# Patient Record
Sex: Female | Born: 1975 | Race: White | Hispanic: No | Smoking: Current every day smoker
Health system: Southern US, Community
[De-identification: ages and names within clinical notes are randomized; demographics above are authoritative.]

## PROBLEM LIST (undated history)

## (undated) DIAGNOSIS — B1920 Unspecified viral hepatitis C without hepatic coma: Secondary | ICD-10-CM

## (undated) DIAGNOSIS — Z87442 Personal history of urinary calculi: Secondary | ICD-10-CM

## (undated) DIAGNOSIS — F1910 Other psychoactive substance abuse, uncomplicated: Secondary | ICD-10-CM

## (undated) DIAGNOSIS — S92909K Unspecified fracture of unspecified foot, subsequent encounter for fracture with nonunion: Secondary | ICD-10-CM

## (undated) DIAGNOSIS — Z9889 Other specified postprocedural states: Secondary | ICD-10-CM

## (undated) DIAGNOSIS — F410 Panic disorder [episodic paroxysmal anxiety] without agoraphobia: Secondary | ICD-10-CM

## (undated) DIAGNOSIS — R569 Unspecified convulsions: Secondary | ICD-10-CM

## (undated) DIAGNOSIS — K859 Acute pancreatitis without necrosis or infection, unspecified: Secondary | ICD-10-CM

## (undated) DIAGNOSIS — F1010 Alcohol abuse, uncomplicated: Secondary | ICD-10-CM

## (undated) DIAGNOSIS — K259 Gastric ulcer, unspecified as acute or chronic, without hemorrhage or perforation: Secondary | ICD-10-CM

## (undated) DIAGNOSIS — J189 Pneumonia, unspecified organism: Secondary | ICD-10-CM

## (undated) DIAGNOSIS — R112 Nausea with vomiting, unspecified: Secondary | ICD-10-CM

## (undated) DIAGNOSIS — S3992XA Unspecified injury of lower back, initial encounter: Secondary | ICD-10-CM

## (undated) HISTORY — PX: BREAST SURGERY: SHX581

## (undated) HISTORY — PX: BACK SURGERY: SHX140

## (undated) HISTORY — PX: TONSILLECTOMY: SUR1361

---

## 2002-01-03 ENCOUNTER — Encounter: Payer: Self-pay | Admitting: Internal Medicine

## 2002-01-03 ENCOUNTER — Ambulatory Visit (HOSPITAL_COMMUNITY): Admission: RE | Admit: 2002-01-03 | Discharge: 2002-01-03 | Payer: Self-pay | Admitting: Internal Medicine

## 2004-08-28 ENCOUNTER — Emergency Department (HOSPITAL_COMMUNITY): Admission: EM | Admit: 2004-08-28 | Discharge: 2004-08-28 | Payer: Self-pay | Admitting: Emergency Medicine

## 2005-12-31 ENCOUNTER — Emergency Department (HOSPITAL_COMMUNITY): Admission: EM | Admit: 2005-12-31 | Discharge: 2005-12-31 | Payer: Self-pay | Admitting: Emergency Medicine

## 2007-12-30 ENCOUNTER — Emergency Department (HOSPITAL_COMMUNITY): Admission: EM | Admit: 2007-12-30 | Discharge: 2007-12-30 | Payer: Self-pay | Admitting: Emergency Medicine

## 2007-12-30 ENCOUNTER — Encounter: Payer: Self-pay | Admitting: Orthopedic Surgery

## 2008-01-01 ENCOUNTER — Ambulatory Visit: Payer: Self-pay | Admitting: Orthopedic Surgery

## 2008-01-01 DIAGNOSIS — M25579 Pain in unspecified ankle and joints of unspecified foot: Secondary | ICD-10-CM

## 2008-01-16 ENCOUNTER — Ambulatory Visit: Payer: Self-pay | Admitting: Orthopedic Surgery

## 2008-01-24 ENCOUNTER — Encounter: Payer: Self-pay | Admitting: Orthopedic Surgery

## 2008-01-31 ENCOUNTER — Encounter: Payer: Self-pay | Admitting: Orthopedic Surgery

## 2008-02-18 ENCOUNTER — Ambulatory Visit: Payer: Self-pay | Admitting: Orthopedic Surgery

## 2008-04-09 ENCOUNTER — Emergency Department (HOSPITAL_COMMUNITY): Admission: EM | Admit: 2008-04-09 | Discharge: 2008-04-10 | Payer: Self-pay | Admitting: Emergency Medicine

## 2008-04-09 ENCOUNTER — Ambulatory Visit: Payer: Self-pay | Admitting: Psychiatry

## 2008-04-10 ENCOUNTER — Inpatient Hospital Stay (HOSPITAL_COMMUNITY): Admission: RE | Admit: 2008-04-10 | Discharge: 2008-04-11 | Payer: Self-pay | Admitting: Psychiatry

## 2008-06-15 ENCOUNTER — Emergency Department (HOSPITAL_COMMUNITY): Admission: EM | Admit: 2008-06-15 | Discharge: 2008-06-15 | Payer: Self-pay | Admitting: Emergency Medicine

## 2008-07-31 ENCOUNTER — Emergency Department (HOSPITAL_COMMUNITY): Admission: EM | Admit: 2008-07-31 | Discharge: 2008-07-31 | Payer: Self-pay | Admitting: Emergency Medicine

## 2008-09-03 ENCOUNTER — Emergency Department (HOSPITAL_COMMUNITY): Admission: EM | Admit: 2008-09-03 | Discharge: 2008-09-04 | Payer: Self-pay | Admitting: Emergency Medicine

## 2008-09-04 ENCOUNTER — Ambulatory Visit: Payer: Self-pay | Admitting: Psychiatry

## 2008-09-04 ENCOUNTER — Inpatient Hospital Stay (HOSPITAL_COMMUNITY): Admission: EM | Admit: 2008-09-04 | Discharge: 2008-09-07 | Payer: Self-pay | Admitting: Psychiatry

## 2008-10-29 ENCOUNTER — Emergency Department (HOSPITAL_COMMUNITY): Admission: EM | Admit: 2008-10-29 | Discharge: 2008-10-30 | Payer: Self-pay | Admitting: Emergency Medicine

## 2009-01-21 ENCOUNTER — Emergency Department (HOSPITAL_COMMUNITY): Admission: EM | Admit: 2009-01-21 | Discharge: 2009-01-21 | Payer: Self-pay | Admitting: Emergency Medicine

## 2009-05-03 ENCOUNTER — Inpatient Hospital Stay (HOSPITAL_COMMUNITY): Admission: RE | Admit: 2009-05-03 | Discharge: 2009-05-06 | Payer: Self-pay | Admitting: Psychiatry

## 2009-05-03 ENCOUNTER — Ambulatory Visit: Payer: Self-pay | Admitting: Psychiatry

## 2009-05-03 ENCOUNTER — Emergency Department (HOSPITAL_COMMUNITY): Admission: EM | Admit: 2009-05-03 | Discharge: 2009-05-03 | Payer: Self-pay | Admitting: Emergency Medicine

## 2009-05-11 ENCOUNTER — Emergency Department (HOSPITAL_COMMUNITY): Admission: EM | Admit: 2009-05-11 | Discharge: 2009-05-11 | Payer: Self-pay | Admitting: Emergency Medicine

## 2009-11-07 ENCOUNTER — Ambulatory Visit: Payer: Self-pay | Admitting: Psychiatry

## 2009-11-07 ENCOUNTER — Emergency Department (HOSPITAL_COMMUNITY): Admission: EM | Admit: 2009-11-07 | Discharge: 2009-11-07 | Payer: Self-pay | Admitting: Emergency Medicine

## 2009-11-07 ENCOUNTER — Inpatient Hospital Stay (HOSPITAL_COMMUNITY): Admission: AD | Admit: 2009-11-07 | Discharge: 2009-11-09 | Payer: Self-pay | Admitting: Psychiatry

## 2009-11-10 ENCOUNTER — Emergency Department (HOSPITAL_COMMUNITY): Admission: EM | Admit: 2009-11-10 | Discharge: 2009-11-10 | Payer: Self-pay | Admitting: Emergency Medicine

## 2009-11-11 ENCOUNTER — Encounter (HOSPITAL_COMMUNITY): Admission: RE | Admit: 2009-11-11 | Discharge: 2009-12-11 | Payer: Self-pay | Admitting: Emergency Medicine

## 2009-11-12 ENCOUNTER — Ambulatory Visit (HOSPITAL_COMMUNITY): Payer: Self-pay | Admitting: Psychiatry

## 2009-12-18 ENCOUNTER — Emergency Department (HOSPITAL_COMMUNITY): Admission: EM | Admit: 2009-12-18 | Discharge: 2009-12-18 | Payer: Self-pay | Admitting: Emergency Medicine

## 2010-07-01 ENCOUNTER — Emergency Department (HOSPITAL_COMMUNITY): Admission: EM | Admit: 2010-07-01 | Discharge: 2009-11-14 | Payer: Self-pay | Admitting: Emergency Medicine

## 2010-10-11 LAB — URINALYSIS, ROUTINE W REFLEX MICROSCOPIC
Hgb urine dipstick: NEGATIVE
Protein, ur: NEGATIVE mg/dL
Specific Gravity, Urine: 1.015 (ref 1.005–1.030)
Urobilinogen, UA: 0.2 mg/dL (ref 0.0–1.0)
pH: 5 (ref 5.0–8.0)

## 2010-10-11 LAB — DIFFERENTIAL
Basophils Absolute: 0 10*3/uL (ref 0.0–0.1)
Basophils Relative: 1 % (ref 0–1)
Lymphocytes Relative: 33 % (ref 12–46)
Neutro Abs: 4.9 10*3/uL (ref 1.7–7.7)

## 2010-10-11 LAB — RAPID URINE DRUG SCREEN, HOSP PERFORMED
Amphetamines: NOT DETECTED
Cocaine: POSITIVE — AB
Tetrahydrocannabinol: POSITIVE — AB

## 2010-10-11 LAB — COMPREHENSIVE METABOLIC PANEL
ALT: 41 U/L — ABNORMAL HIGH (ref 0–35)
Albumin: 4.1 g/dL (ref 3.5–5.2)
BUN: 5 mg/dL — ABNORMAL LOW (ref 6–23)
CO2: 21 mEq/L (ref 19–32)
Chloride: 112 mEq/L (ref 96–112)
Glucose, Bld: 96 mg/dL (ref 70–99)
Potassium: 3.3 mEq/L — ABNORMAL LOW (ref 3.5–5.1)
Sodium: 147 mEq/L — ABNORMAL HIGH (ref 135–145)
Total Bilirubin: 0.3 mg/dL (ref 0.3–1.2)

## 2010-10-11 LAB — CBC
HCT: 40.5 % (ref 36.0–46.0)
RDW: 14.2 % (ref 11.5–15.5)

## 2010-10-12 LAB — CBC
HCT: 39.3 % (ref 36.0–46.0)
Hemoglobin: 13.9 g/dL (ref 12.0–15.0)
MCV: 93.2 fL (ref 78.0–100.0)
Platelets: 228 10*3/uL (ref 150–400)
RBC: 4.22 MIL/uL (ref 3.87–5.11)

## 2010-10-12 LAB — DIFFERENTIAL
Basophils Absolute: 0.1 10*3/uL (ref 0.0–0.1)
Eosinophils Absolute: 0.1 10*3/uL (ref 0.0–0.7)
Lymphocytes Relative: 34 % (ref 12–46)
Lymphs Abs: 3.7 10*3/uL (ref 0.7–4.0)
Monocytes Absolute: 0.6 10*3/uL (ref 0.1–1.0)
Neutro Abs: 6.4 10*3/uL (ref 1.7–7.7)

## 2010-10-12 LAB — RAPID URINE DRUG SCREEN, HOSP PERFORMED
Amphetamines: NOT DETECTED
Barbiturates: NOT DETECTED
Benzodiazepines: NOT DETECTED
Tetrahydrocannabinol: NOT DETECTED

## 2010-10-12 LAB — URINALYSIS, ROUTINE W REFLEX MICROSCOPIC
Hgb urine dipstick: NEGATIVE
Nitrite: NEGATIVE
Specific Gravity, Urine: <1.005 — ABNORMAL LOW (ref 1.005–1.030)
pH: 6 (ref 5.0–8.0)

## 2010-10-12 LAB — BASIC METABOLIC PANEL
BUN: 5 mg/dL — ABNORMAL LOW (ref 6–23)
CO2: 24 mEq/L (ref 19–32)
Chloride: 113 mEq/L — ABNORMAL HIGH (ref 96–112)
Creatinine, Ser: 0.58 mg/dL (ref 0.4–1.2)
GFR calc non Af Amer: >60 mL/min (ref >60)
Glucose, Bld: 104 mg/dL — ABNORMAL HIGH (ref 70–99)
Potassium: 3.5 mEq/L (ref 3.5–5.1)

## 2010-10-12 LAB — ETHANOL: Alcohol, Ethyl (B): 293 mg/dL — ABNORMAL HIGH (ref 0–10)

## 2010-10-28 LAB — POCT I-STAT, CHEM 8
BUN: 8 mg/dL (ref 6–23)
Chloride: 100 mEq/L (ref 96–112)
Hemoglobin: 14.6 g/dL (ref 12.0–15.0)

## 2010-10-28 LAB — RAPID URINE DRUG SCREEN, HOSP PERFORMED
Amphetamines: POSITIVE — AB
Benzodiazepines: POSITIVE — AB
Opiates: POSITIVE — AB
Tetrahydrocannabinol: POSITIVE — AB

## 2010-11-01 LAB — RAPID URINE DRUG SCREEN, HOSP PERFORMED
Amphetamines: NOT DETECTED
Barbiturates: NOT DETECTED
Benzodiazepines: NOT DETECTED
Opiates: POSITIVE — AB
Tetrahydrocannabinol: NOT DETECTED

## 2010-11-01 LAB — BASIC METABOLIC PANEL
BUN: 4 mg/dL — ABNORMAL LOW (ref 6–23)
Calcium: 9.7 mg/dL (ref 8.4–10.5)
Creatinine, Ser: 0.68 mg/dL (ref 0.4–1.2)
GFR calc non Af Amer: >60 mL/min (ref >60)
Glucose, Bld: 116 mg/dL — ABNORMAL HIGH (ref 70–99)

## 2010-11-01 LAB — ETHANOL: Alcohol, Ethyl (B): <5 mg/dL (ref 0–10)

## 2010-11-01 LAB — DIFFERENTIAL
Basophils Absolute: 0 10*3/uL (ref 0.0–0.1)
Eosinophils Relative: 3 % (ref 0–5)
Lymphocytes Relative: 31 % (ref 12–46)
Neutro Abs: 2.6 10*3/uL (ref 1.7–7.7)
Neutrophils Relative %: 58 % (ref 43–77)

## 2010-11-01 LAB — CBC
Platelets: 143 10*3/uL — ABNORMAL LOW (ref 150–400)
RDW: 13.1 % (ref 11.5–15.5)

## 2010-11-03 LAB — DIFFERENTIAL
Eosinophils Relative: 2 % (ref 0–5)
Lymphocytes Relative: 34 % (ref 12–46)
Lymphs Abs: 4.2 10*3/uL — ABNORMAL HIGH (ref 0.7–4.0)

## 2010-11-03 LAB — CBC
HCT: 41.4 % (ref 36.0–46.0)
Platelets: 249 10*3/uL (ref 150–400)
RBC: 4.33 MIL/uL (ref 3.87–5.11)
WBC: 12.2 10*3/uL — ABNORMAL HIGH (ref 4.0–10.5)

## 2010-11-03 LAB — RAPID URINE DRUG SCREEN, HOSP PERFORMED
Barbiturates: NOT DETECTED
Benzodiazepines: POSITIVE — AB
Cocaine: NOT DETECTED

## 2010-11-03 LAB — BASIC METABOLIC PANEL
BUN: 6 mg/dL (ref 6–23)
GFR calc Af Amer: >60 mL/min (ref >60)
GFR calc non Af Amer: >60 mL/min (ref >60)
Potassium: 3.9 mEq/L (ref 3.5–5.1)
Sodium: 140 mEq/L (ref 135–145)

## 2010-11-03 LAB — ETHANOL: Alcohol, Ethyl (B): 292 mg/dL — ABNORMAL HIGH (ref 0–10)

## 2010-11-03 LAB — PREGNANCY, URINE: Preg Test, Ur: NEGATIVE

## 2010-11-09 LAB — DIFFERENTIAL
Basophils Relative: 0 % (ref 0–1)
Eosinophils Absolute: 0 10*3/uL (ref 0.0–0.7)
Neutrophils Relative %: 74 % (ref 43–77)

## 2010-11-09 LAB — URINALYSIS, ROUTINE W REFLEX MICROSCOPIC
Glucose, UA: NEGATIVE mg/dL
Ketones, ur: 15 mg/dL — AB
Nitrite: NEGATIVE
Protein, ur: NEGATIVE mg/dL

## 2010-11-09 LAB — BASIC METABOLIC PANEL
BUN: 12 mg/dL (ref 6–23)
CO2: 24 mEq/L (ref 19–32)
Chloride: 95 mEq/L — ABNORMAL LOW (ref 96–112)
Creatinine, Ser: 0.85 mg/dL (ref 0.4–1.2)

## 2010-11-09 LAB — CBC
MCHC: 35.1 g/dL (ref 30.0–36.0)
MCV: 91.4 fL (ref 78.0–100.0)
Platelets: 232 10*3/uL (ref 150–400)

## 2010-11-09 LAB — PREGNANCY, URINE: Preg Test, Ur: NEGATIVE

## 2010-11-09 LAB — RAPID URINE DRUG SCREEN, HOSP PERFORMED
Benzodiazepines: POSITIVE — AB
Tetrahydrocannabinol: NOT DETECTED

## 2010-11-09 LAB — RPR: RPR Ser Ql: NONREACTIVE

## 2010-11-09 LAB — T4, FREE: Free T4: 1.37 ng/dL (ref 0.89–1.80)

## 2010-12-07 NOTE — H&P (Signed)
Olivia Lester, Olivia Lester                    ACCOUNT NO.:  [id number]   MEDICAL RECORD NO.:  [id number]          PATIENT TYPE:  IPS   LOCATION:  0303                          FACILITY:  [facility]   PHYSICIAN:  Anselm Jungling, MD  DATE OF BIRTH:  [birth year]   DATE OF ADMISSION:  04/10/2008  DATE OF DISCHARGE:  04/11/2008                       PSYCHIATRIC ADMISSION ASSESSMENT   This is a 35 year old female voluntarily admitted on April 09, 2008.  The patient states that she is here today because she had a psychotic  break.  She states she has been hearing voices under her house.  She  states that she has been under a lot of stress.  She heard voices that  were very threatening.  She did have people check to make sure that this  is not an issue.  She states she has been feeling like this for  approximately 1 day.  She has not been sleeping, reports little sleep  over the past 4 days, was having some paranoid ideations.  States that  she saw people around her home with flashlights.  Her appetite has been  minimal.  She reports that she had recently moved into a new home.  She  cares for her three children, young children by herself as her husband  works out of town Monday through Friday.   PAST PSYCHIATRIC HISTORY:  First admission to [facility].  Has no other psychiatric hospitalizations.  No prior history of being  treated for depression, anxiety or psychosis.  She does report that she  has had a history of anxiety, however.   SOCIAL HISTORY:  She is a married female and has three children ages 47,  15 and 36.  Husband works Holiday representative out of town Monday through Friday  and had a recent move to a new home in June of this year.   FAMILY HISTORY:  Mother with a history of bipolar and is on medications.   ALCOHOL AND DRUG HISTORY:  The patient reports some history of cigarette  use.  Has stopped smoking about 1 week ago.  Denies any alcohol or drug  use.   PRIMARY CARE Judi Jaffe:   Dr. Wynonia Lawman in [place], [place].   MEDICAL PROBLEMS:  Chronic pain.   MEDICATIONS:  1. Listed is Lorcet 5/325 q.i.d. p.r.n.  2. Flexeril.  3. Nexium.  4. Ritalin.   DRUG ALLERGIES:  No known allergies.   PHYSICAL EXAM:  This is a thin female who was assessed over at [facility].  Heart rate of 122, 20 respirations, blood pressure is  122/80.  Her laboratory data shows an alcohol level less than 5.  Urine  drug screen is positive for benzodiazepines, positive for opiates.  Potassium of 3.4.  Pregnancy test is negative.  Urinalysis shows 15  ketones and a WBC count of 10.9.   MENTAL STATUS EXAM:  The patient initially is in the bed.  She did  awaken readily.  Answered a few questions and asked for the interviewer  to come back.  She then did get up and was fully  dressed and was asking  for the interviewer then.  She was cooperative, appropriately dressed,  good eye contact.  Her speech was rapid, clear.  Mood is anxious.  The  patient is tearful readily.  Thought process - endorsing paranoid  ideation and auditory hallucinations although she denies any psychotic  symptoms at this time.  Cognitive function intact.  Her memory is fair.  Judgment and insight fair.   AXIS I:  Psychosis not otherwise specified.  AXIS II:  Deferred.  AXIS III:  Chronic pain.  AXIS IV:  Psychosocial problems recently.  AXIS V:  Current is 35.   PLAN:  Contract for safety.  Will stabilize mood and thinking.  Will  have Zyprexa available on a p.r.n. basis.  Will clarify her medications  with pharmacy.  We will contact her husband for any concerns and  background and support and discharge planning.  Will have trazodone for  sleep.  Her tentative length of stay is 3-5 days.      Landry Corporal, N.P.      Anselm Jungling, MD  Electronically Signed    JO/MEDQ  D:  04/10/2008  T:  04/13/2008  Job:  [id number]

## 2010-12-10 NOTE — Discharge Summary (Signed)
[name], Lester                    ACCOUNT NO.:  [id number]   MEDICAL RECORD NO.:  [id number]          PATIENT TYPE:  IPS   LOCATION:  0303                          FACILITY:  [facility]   PHYSICIAN:  Anselm Jungling, MD  DATE OF BIRTH:  [birth year]   DATE OF ADMISSION:  09/04/2008  DATE OF DISCHARGE:  09/07/2008                               DISCHARGE SUMMARY   IDENTIFYING DATA AND REASON FOR ADMISSION:  This is the second [facility]  admission for Olivia Lester, a 35 year old married Caucasian female, who had been  here for 2 days since September of 2009 with a brief psychotic episode.  She returned this time with similar symptoms including auditory  hallucinations and paranoia.  She had not been involved in any  outpatient treatment or pharmacotherapy.  Please refer to the admission  note for further details pertaining to the symptoms, circumstances and  history that led to her hospitalization.  She was given an initial Axis  I diagnosis of psychosis NOS.   MEDICAL AND LABORATORY:  The patient is in good health without any  active or chronic medical problems.  She was medically and physically  assessed by the psychiatric nurse practitioner.  There were no  significant medical issues.   HOSPITAL COURSE:  The patient was admitted to the adult inpatient  psychiatric service.  She presented as a well-nourished, normally-  developed female who was alert, fully oriented, pleasant, and showed a  good level of insight into her underlying mental abnormality.  She  wanted treatment and agreed to psychotropic medication to address her  symptoms.  Her thoughts were mildly disorganized, but she made no  overtly delusional statements.  She was treated with a regimen of  Risperdal and Klonopin, which were adjusted during her stay, and to  which she responded with a rapid reduction of her symptoms and  normalization of her sleep.  Her reality testing remained good.   On the third hospital day there was a family  session involving the  patient and her husband.  In that meeting, the patient reported that she  was sleeping better and eating well, which were both taken as good  signs.  She did have mild sedation from the medication, but this was not  a problem.  They discussed the need for aftercare.  Husband discussed  stresses that have been impinging upon them because of his working in  [place] 3 days per week.  Also, they have a toddler-aged child and a 24-  year-old with home.  There is also stress involving the extended family  being critical of them.  Husband was supportive of his wife and  indicated that he himself thought he might need help with anger  management.  He indicated that he thought that the patient's alcohol use  was of concern as well.  The patient and husband both agreed that they  should neither be drinking, and that they would get all alcohol out of  the house.  A 12-step recovery was discussed with both the patient and  her husband, and they indicated  that they do not feel that these  approaches are helpful to them.  They discussed the fact that DSS has  been involved with the family due to report of child neglect.   The patient was discharged the day following this family session.  She  was in good spirits, relaxed, pleasant, and agreed to the following  aftercare plan.   AFTERCARE:  The patient was to follow-up with Saul Fordyce at  [facility] with an appointment on September 16, 2008  at 9 a.m.   DISCHARGE MEDICATIONS:  Risperdal 1 mg q.a.m. and 3 mg q.h.s., and  Klonopin 0.5 mg b.i.d.  It was understood that the patient would be  abstinent from alcohol.   DISCHARGE DIAGNOSES:  AXIS I: Psychosis, NOS.  Alcohol abuse NOS, early  remission.  AXIS II: Deferred.  AXIS III: No acute or chronic illnesses.  AXIS IV: Stressors severe.  AXIS V: GAF on discharge 65.      Anselm Jungling, MD  Electronically Signed     SPB/MEDQ  D:  09/08/2008  T:   09/08/2008  Job:  [id number]

## 2011-04-25 LAB — URINALYSIS, ROUTINE W REFLEX MICROSCOPIC
Hgb urine dipstick: NEGATIVE
Leukocytes, UA: NEGATIVE
Nitrite: NEGATIVE
Specific Gravity, Urine: >1.03 — ABNORMAL HIGH
Urobilinogen, UA: 0.2

## 2011-04-25 LAB — BASIC METABOLIC PANEL
BUN: 19
CO2: 22
Calcium: 9.7
Chloride: 104
Creatinine, Ser: 0.83
GFR calc Af Amer: >60
Glucose, Bld: 96

## 2011-04-25 LAB — CBC
MCV: 96.6
Platelets: 167
RDW: 13.8
WBC: 10.9 — ABNORMAL HIGH

## 2011-04-25 LAB — RAPID URINE DRUG SCREEN, HOSP PERFORMED
Opiates: POSITIVE — AB
Tetrahydrocannabinol: NOT DETECTED

## 2011-04-25 LAB — URINE MICROSCOPIC-ADD ON

## 2011-04-25 LAB — DIFFERENTIAL
Basophils Absolute: 0.1
Eosinophils Absolute: 0.1
Lymphs Abs: 1.3
Neutrophils Relative %: 80 — ABNORMAL HIGH

## 2011-04-26 LAB — URINE MICROSCOPIC-ADD ON

## 2011-04-26 LAB — URINALYSIS, ROUTINE W REFLEX MICROSCOPIC
Bilirubin Urine: NEGATIVE
Leukocytes, UA: NEGATIVE
Nitrite: NEGATIVE
Protein, ur: 30 — AB
Urobilinogen, UA: 0.2
pH: 5.5

## 2011-04-26 LAB — URINE CULTURE: Colony Count: 50000

## 2011-04-26 LAB — PREGNANCY, URINE: Preg Test, Ur: NEGATIVE

## 2011-12-23 ENCOUNTER — Encounter (HOSPITAL_COMMUNITY): Payer: Self-pay | Admitting: *Deleted

## 2011-12-23 ENCOUNTER — Emergency Department (HOSPITAL_COMMUNITY): Payer: Self-pay

## 2011-12-23 ENCOUNTER — Emergency Department (HOSPITAL_COMMUNITY)
Admission: EM | Admit: 2011-12-23 | Discharge: 2011-12-24 | Disposition: A | Discharge status: Home / Self Care | Payer: Self-pay | Attending: Emergency Medicine | Admitting: Emergency Medicine

## 2011-12-23 DIAGNOSIS — R5381 Other malaise: Secondary | ICD-10-CM | POA: Insufficient documentation

## 2011-12-23 DIAGNOSIS — IMO0001 Reserved for inherently not codable concepts without codable children: Secondary | ICD-10-CM | POA: Insufficient documentation

## 2011-12-23 DIAGNOSIS — R5383 Other fatigue: Secondary | ICD-10-CM | POA: Insufficient documentation

## 2011-12-23 DIAGNOSIS — J40 Bronchitis, not specified as acute or chronic: Secondary | ICD-10-CM | POA: Insufficient documentation

## 2011-12-23 DIAGNOSIS — J029 Acute pharyngitis, unspecified: Secondary | ICD-10-CM | POA: Insufficient documentation

## 2011-12-23 DIAGNOSIS — J3489 Other specified disorders of nose and nasal sinuses: Secondary | ICD-10-CM | POA: Insufficient documentation

## 2011-12-23 DIAGNOSIS — R51 Headache: Secondary | ICD-10-CM | POA: Insufficient documentation

## 2011-12-23 DIAGNOSIS — E876 Hypokalemia: Secondary | ICD-10-CM | POA: Insufficient documentation

## 2011-12-23 DIAGNOSIS — F17200 Nicotine dependence, unspecified, uncomplicated: Secondary | ICD-10-CM | POA: Insufficient documentation

## 2011-12-23 DIAGNOSIS — R509 Fever, unspecified: Secondary | ICD-10-CM | POA: Insufficient documentation

## 2011-12-23 MED ORDER — HYDROCOD POLST-CHLORPHEN POLST 10-8 MG/5ML PO LQCR
5.0000 mL | Freq: Once | ORAL | Status: AC
  Administered 2011-12-23: 5 mL via ORAL
  Filled 2011-12-23: qty 5

## 2011-12-23 NOTE — ED Notes (Signed)
Cough, fever,body aches. Vomiting after coughing episodes,  Headache and neck pain

## 2011-12-24 LAB — BASIC METABOLIC PANEL
BUN: 3 mg/dL — ABNORMAL LOW (ref 6–23)
Calcium: 9.3 mg/dL (ref 8.4–10.5)
GFR calc Af Amer: >90 mL/min (ref >90)
GFR calc non Af Amer: >90 mL/min (ref >90)
Glucose, Bld: 84 mg/dL (ref 70–99)
Potassium: 2.6 mEq/L — CL (ref 3.5–5.1)
Sodium: 138 mEq/L (ref 135–145)

## 2011-12-24 LAB — DIFFERENTIAL
Basophils Relative: 0 % (ref 0–1)
Eosinophils Absolute: 0 10*3/uL (ref 0.0–0.7)
Eosinophils Relative: 0 % (ref 0–5)
Lymphs Abs: 1.5 10*3/uL (ref 0.7–4.0)
Monocytes Relative: 5 % (ref 3–12)
Neutrophils Relative %: 74 % (ref 43–77)

## 2011-12-24 LAB — URINALYSIS, ROUTINE W REFLEX MICROSCOPIC
Bilirubin Urine: NEGATIVE
Hgb urine dipstick: NEGATIVE
Ketones, ur: NEGATIVE mg/dL
Nitrite: NEGATIVE
Protein, ur: NEGATIVE mg/dL
Urobilinogen, UA: 1 mg/dL (ref 0.0–1.0)

## 2011-12-24 LAB — CBC
MCH: 33.9 pg (ref 26.0–34.0)
MCHC: 35.5 g/dL (ref 30.0–36.0)
MCV: 95.6 fL (ref 78.0–100.0)
Platelets: 135 10*3/uL — ABNORMAL LOW (ref 150–400)
RBC: 4.51 MIL/uL (ref 3.87–5.11)

## 2011-12-24 MED ORDER — POTASSIUM CHLORIDE CRYS ER 20 MEQ PO TBCR
40.0000 meq | EXTENDED_RELEASE_TABLET | Freq: Once | ORAL | Status: AC
  Administered 2011-12-24: 40 meq via ORAL
  Filled 2011-12-24: qty 2

## 2011-12-24 MED ORDER — POTASSIUM CHLORIDE ER 10 MEQ PO TBCR: 20.0000 meq | EXTENDED_RELEASE_TABLET | Freq: Two times a day (BID) | ORAL | Status: DC

## 2011-12-24 MED ORDER — AZITHROMYCIN 250 MG PO TABS: ORAL_TABLET | ORAL | Status: DC

## 2011-12-24 MED ORDER — ALBUTEROL SULFATE HFA 108 (90 BASE) MCG/ACT IN AERS
2.0000 | INHALATION_SPRAY | Freq: Once | RESPIRATORY_TRACT | Status: AC
  Administered 2011-12-24: 2 via RESPIRATORY_TRACT
  Filled 2011-12-24: qty 6.7

## 2011-12-24 MED ORDER — GUAIFENESIN-CODEINE 100-10 MG/5ML PO SYRP: 10.0000 mL | ORAL_SOLUTION | Freq: Three times a day (TID) | ORAL | Status: AC | PRN

## 2011-12-24 NOTE — Discharge Instructions (Signed)
Bronchitis Bronchitis is a problem of the air tubes leading to your lungs. This problem makes it hard for air to get in and out of the lungs. You may cough a lot because your air tubes are narrow. Going without care can cause lasting (chronic) bronchitis. HOME CARE   Drink enough fluids to keep your pee (urine) clear or pale yellow.   Use a cool mist humidifier.   Quit smoking if you smoke. If you keep smoking, the bronchitis might not get better.   Only take medicine as told by your doctor.  GET HELP RIGHT AWAY IF:   Coughing keeps you awake.   You start to wheeze.   You become more sick or weak.   You have a hard time breathing or get short of breath.   You cough up blood.   Coughing lasts more than 2 weeks.   You have a fever.   Your baby is older than 3 months with a rectal temperature of 102 F (38.9 C) or higher.   Your baby is 64 months old or younger with a rectal temperature of 100.4 F (38 C) or higher.  MAKE SURE YOU:  Understand these instructions.   Will watch your condition.   Will get help right away if you are not doing well or get worse.  Document Released: 12/28/2007 Document Revised: 06/30/2011 Document Reviewed: 06/12/2009 [facility] Patient Information 2012 [place], [place].Hypokalemia Hypokalemia means a low potassium level in the blood.Potassium is an electrolyte that helps regulate the amount of fluid in the body. It also stimulates muscle contraction and maintains a stable acid-base balance.Most of the body's potassium is inside of cells, and only a very small amount is in the blood. Because the amount in the blood is so small, minor changes can have big effects. PREPARATION FOR TEST Testing for potassium requires taking a blood sample taken by needle from a vein in the arm. The skin is cleaned thoroughly before the sample is drawn. There is no other special preparation needed. NORMAL VALUES Potassium levels below 3.5 mEq/L are abnormally low.  Levels above 5.1 mEq/L are abnormally high. Ranges for normal findings may vary among different laboratories and hospitals. You should always check with your doctor after having lab work or other tests done to discuss the meaning of your test results and whether your values are considered within normal limits. MEANING OF TEST  Your caregiver will go over the test results with you and discuss the importance and meaning of your results, as well as treatment options and the need for additional tests, if necessary. A potassium level is frequently part of a routine medical exam. It is usually included as part of a whole "panel" of tests for several blood salts (such as Sodium and Chloride). It may be done as part of follow-up when a low potassium level was found in the past or other blood salts are suspected of being out of balance. A low potassium level might be suspected if you have one or more of the following:  Symptoms of weakness.   Abnormal heart rhythms.   High blood pressure and are taking medication to control this, especially water pills (diuretics).   Kidney disease that can affect your potassium level .   Diabetes requiring the use of insulin. The potassium may fall after taking insulin, especially if the diabetes had been out of control for a while.   A condition requiring the use of cortisone-type medication or certain types of antibiotics.  Vomiting and/or diarrhea for more than a day or two.   A stomach or intestinal condition that may not permit appropriate absorption of potassium.   Fainting episodes.   Mental confusion.  OBTAINING TEST RESULTS It is your responsibility to obtain your test results. Ask the lab or department performing the test when and how you will get your results.  Please contact your caregiver directly if you have not received the results within one week. At that time, ask if there is anything different or new you should be doing in relation to the  results. TREATMENT Hypokalemia can be treated with potassium supplements taken by mouth and/or adjustments in your current medications. A diet high in potassium is also helpful. Foods with high potassium content are:  Peas, lentils, lima beans, nuts, and dried fruit.   Whole grain and bran cereals and breads.   Fresh fruit, vegetables (bananas, cantaloupe, grapefruit, oranges, tomatoes, honeydew melons, potatoes).   Orange and tomato juices.   Meats. If potassium supplement has been prescribed for you today or your medications have been adjusted, see your personal caregiver in time02 for a re-check.  SEEK MEDICAL CARE IF:  There is a feeling of worsening weakness.   You experience repeated chest palpitations.   You are diabetic and having difficulty keeping your blood sugars in the normal range.   You are experiencing vomiting and/or diarrhea.   You are having difficulty with any of your regular medications.  SEEK IMMEDIATE MEDICAL CARE IF:  You experience chest pain, shortness of breath, or episodes of dizziness.   You have been having vomiting or diarrhea for more than 2 days.   You have a fainting episode.  MAKE SURE YOU:   Understand these instructions.   Will watch your condition.   Will get help right away if you are not doing well or get worse.  Document Released: 07/11/2005 Document Revised: 06/30/2011 Document Reviewed: 06/21/2008 [facility] Patient Information 2012 [place], [place].Foods Rich in Potassium Food / Potassium (mg)  Apricots, dried,  cup / 378 mg   Apricots, raw, 1 cup halves / 401 mg   Avocado,  / 487 mg   Banana, 1 large / 487 mg   Beef, lean, round, 3 oz / 202 mg   Cantaloupe, 1 cup cubes / 427 mg   Dates, medjool, 5 whole / 835 mg   Ham, cured, 3 oz / 212 mg   Lentils, dried,  cup / 458 mg   Lima beans, frozen,  cup / 258 mg   Orange, 1 large / 333 mg   Orange juice, 1 cup / 443 mg   Peaches, dried,  cup / 398 mg    Peas, split, cooked,  cup / 355 mg   Potato, boiled, 1 medium / 515 mg   Prunes, dried, uncooked,  cup / 318 mg   Raisins,  cup / 309 mg   Salmon, pink, raw, 3 oz / 275 mg   Sardines, canned , 3 oz / 338 mg   Tomato, raw, 1 medium / 292 mg   Tomato juice, 6 oz / 417 mg   [place], 3 oz / 349 mg  Document Released: 07/11/2005 Document Revised: 03/23/2011 Document Reviewed: 11/24/2008 [facility] Patient Information 2012 [place], [place].

## 2011-12-24 NOTE — ED Provider Notes (Signed)
History     CSN: [id number]  Arrival date & time 12/23/11  2223   First MD Initiated Contact with Patient 12/23/11 2246      Chief Complaint  Patient presents with  . Cough    (Consider location/radiation/quality/duration/timing/severity/associated sxs/prior treatment) HPI Comments: Patient c/o cough, fever, body aches chills and nasal congestion for several days.  States she woke up this morning with hoarse voice and post-tussive vomiting.  She denies neck stiffness, abd pain, dyspnea or chest pain.  States she has been taking OTC cold medications without relief.  Patient also requests to have her urine checked.  States she was recently treated for a UTI and wants to know if infection cleared.  Denies dysuria.  Patient is a 36 y.o. female presenting with cough. The history is provided by the patient.  Cough This is a new problem. The current episode started 2 days ago. The problem occurs constantly. The problem has been gradually worsening. The cough is non-productive. The maximum temperature recorded prior to her arrival was 101 to 101.9 F. The fever has been present for 1 to 2 days. Associated symptoms include chills, sweats, headaches, rhinorrhea, sore throat and myalgias. Pertinent negatives include no chest pain, no ear pain, no shortness of breath and no wheezing. She has tried nothing for the symptoms. The treatment provided no relief. She is a smoker. Her past medical history does not include bronchitis, pneumonia or asthma.    History reviewed. No pertinent past medical history.  Past Surgical History  Procedure Date  . Back surgery   . Tonsillectomy   . Breast surgery     History reviewed. No pertinent family history.  History  Substance Use Topics  . Smoking status: Current Everyday Smoker  . Smokeless tobacco: Not on file  . Alcohol Use: Yes    OB History    Grav Para Term Preterm Abortions TAB SAB Ect Mult Living                  Review of Systems    Constitutional: Positive for fever, chills and fatigue. Negative for activity change and appetite change.  HENT: Positive for congestion, sore throat, rhinorrhea and sinus pressure. Negative for ear pain and trouble swallowing.   Respiratory: Positive for cough. Negative for chest tightness, shortness of breath and wheezing.   Cardiovascular: Negative for chest pain and palpitations.  Gastrointestinal: Negative for nausea, abdominal pain and diarrhea.  Genitourinary: Negative for dysuria.  Musculoskeletal: Positive for myalgias.  Skin: Negative.   Neurological: Positive for headaches. Negative for dizziness, facial asymmetry, speech difficulty, weakness and numbness.  Psychiatric/Behavioral: Negative for confusion.  All other systems reviewed and are negative.    Allergies  Review of patient's allergies indicates no known allergies.  Home Medications   Current Outpatient Rx  Name Route Sig Dispense Refill  . DIPHENHYDRAMINE HCL 25 MG PO TABS Oral Take 25 mg by mouth daily as needed.    . IBUPROFEN 200 MG PO TABS Oral Take 400 mg by mouth as needed. For fever    . NYQUIL PO Oral Take 2 capsules by mouth every 8 (eight) hours as needed.    Marland Kitchen UNKNOWN TO PATIENT Oral Take 1 tablet by mouth every morning. BIRTH CONTROL TABLETS      BP 143/99  Pulse 84  Temp(Src) 98.5 F (36.9 C) (Oral)  Resp 20  Ht 5\' 7"  (1.702 m)  Wt 135 lb (61.236 kg)  BMI 21.14 kg/m2  SpO2 97%  LMP 12/22/2011  Physical Exam  Nursing note and vitals reviewed. Constitutional: She is oriented to person, place, and time. She appears well-developed and well-nourished. No distress.  HENT:  Head: Normocephalic and atraumatic. No trismus in the jaw.  Right Ear: Tympanic membrane and ear canal normal.  Left Ear: Tympanic membrane and ear canal normal.  Nose: Mucosal edema present. Left sinus exhibits maxillary sinus tenderness and frontal sinus tenderness.  Mouth/Throat: Uvula is midline, oropharynx is clear and  moist and mucous membranes are normal. No uvula swelling.  Neck: Normal range of motion. Neck supple. No thyromegaly present.  Cardiovascular: Normal rate, regular rhythm, normal heart sounds and intact distal pulses.   No murmur heard. Pulmonary/Chest: Effort normal and breath sounds normal. No respiratory distress. She has no wheezes.  Musculoskeletal: She exhibits no edema and no tenderness.  Lymphadenopathy:    She has no cervical adenopathy.  Neurological: She is alert and oriented to person, place, and time. She exhibits normal muscle tone. Coordination normal.  Skin: Skin is warm and dry.    ED Course  Procedures (including critical care time)  Results for orders placed during the hospital encounter of 12/23/11  URINALYSIS, ROUTINE W REFLEX MICROSCOPIC      Component Value Range   Color, Urine YELLOW  YELLOW    APPearance CLEAR  CLEAR    Specific Gravity, Urine <1.005 (*) 1.005 - 1.030    pH 7.0  5.0 - 8.0    Glucose, UA NEGATIVE  NEGATIVE (mg/dL)   Hgb urine dipstick NEGATIVE  NEGATIVE    Bilirubin Urine NEGATIVE  NEGATIVE    Ketones, ur NEGATIVE  NEGATIVE (mg/dL)   Protein, ur NEGATIVE  NEGATIVE (mg/dL)   Urobilinogen, UA 1.0  0.0 - 1.0 (mg/dL)   Nitrite NEGATIVE  NEGATIVE    Leukocytes, UA NEGATIVE  NEGATIVE   CBC      Component Value Range   WBC 7.2  4.0 - 10.5 (K/uL)   RBC 4.51  3.87 - 5.11 (MIL/uL)   Hemoglobin 15.3 (*) 12.0 - 15.0 (g/dL)   HCT 69.6  29.5 - 28.4 (%)   MCV 95.6  78.0 - 100.0 (fL)   MCH 33.9  26.0 - 34.0 (pg)   MCHC 35.5  30.0 - 36.0 (g/dL)   RDW 13.2  44.0 - 10.2 (%)   Platelets 135 (*) 150 - 400 (K/uL)  DIFFERENTIAL      Component Value Range   Neutrophils Relative 74  43 - 77 (%)   Neutro Abs 5.3  1.7 - 7.7 (K/uL)   Lymphocytes Relative 21  12 - 46 (%)   Lymphs Abs 1.5  0.7 - 4.0 (K/uL)   Monocytes Relative 5  3 - 12 (%)   Monocytes Absolute 0.4  0.1 - 1.0 (K/uL)   Eosinophils Relative 0  0 - 5 (%)   Eosinophils Absolute 0.0  0.0 - 0.7  (K/uL)   Basophils Relative 0  0 - 1 (%)   Basophils Absolute 0.0  0.0 - 0.1 (K/uL)  BASIC METABOLIC PANEL      Component Value Range   Sodium 138  135 - 145 (mEq/L)   Potassium 2.6 (*) 3.5 - 5.1 (mEq/L)   Chloride 101  96 - 112 (mEq/L)   CO2 22  19 - 32 (mEq/L)   Glucose, Bld 84  70 - 99 (mg/dL)   BUN 3 (*) 6 - 23 (mg/dL)   Creatinine, Ser 7.25 (*) 0.50 - 1.10 (mg/dL)   Calcium 9.3  8.4 - 36.6 (  mg/dL)   GFR calc non Af Amer >90  >90 (mL/min)   GFR calc Af Amer >90  >90 (mL/min)     Dg Chest 2 View  12/24/2011  *RADIOLOGY REPORT*  Clinical Data: Fever.  Myalgias.  Pharyngitis.  Smoker.  CHEST - 2 VIEW  Comparison: 07/31/2008.  Findings: The heart remains normal in size and the lungs remain clear and mildly hyperexpanded with mild diffuse peribronchial thickening.  Unremarkable bones.  IMPRESSION:  1.  No acute abnormality. 2.  Stable mild changes of COPD and chronic bronchitis.  Original Report Authenticated By: Darrol Angel, M.D.      MDM   Previous medical charts, nursing notes and vitals signs from this visit were reviewed by me   All laboratory results and/or imaging results performed on this visit, if applicable, were reviewed by me and discussed with the patient and/or parent as well as recommendation for follow-up    MEDICATIONS GIVEN IN ED: tussionex po   Patient is alert, non-toxic appearing.  Mucus membranes are moist.  No meningeal signs.  Vital stable. Coughing improved after medication.  Sx's likely related to bronchitis.  I have also advised her of her potassium level and the importance of having close f/u on this.  She verbalized understanding to me and agrees to the care plan.    Albuterol inhaler with spacer dispensed from the ED for home use  PRESCRIPTIONS GIVEN AT DISCHARGE: zithromax, tussionex, potassium   Pt stable in ED with no significant deterioration in condition. Pt feels improved after observation and/or treatment in ED. Patient / Family /  Caregiver understand and agree with initial ED impression and plan with expectations set for ED visit.  Patient agrees to return to ED for any worsening symptoms         Vera Wishart L. Masey Scheiber, PA 12/24/11 0100

## 2011-12-26 NOTE — ED Provider Notes (Signed)
Medical screening examination/treatment/procedure(s) were performed by non-physician practitioner and as supervising physician I was immediately available for consultation/collaboration.  Shelda Jakes, MD 12/26/11 1357

## 2012-03-28 ENCOUNTER — Encounter (HOSPITAL_COMMUNITY): Payer: Self-pay

## 2012-03-28 ENCOUNTER — Emergency Department (HOSPITAL_COMMUNITY)
Admission: EM | Admit: 2012-03-28 | Discharge: 2012-03-28 | Disposition: A | Discharge status: Home / Self Care | Payer: Self-pay | Attending: Emergency Medicine | Admitting: Emergency Medicine

## 2012-03-28 DIAGNOSIS — K0889 Other specified disorders of teeth and supporting structures: Secondary | ICD-10-CM

## 2012-03-28 DIAGNOSIS — F17200 Nicotine dependence, unspecified, uncomplicated: Secondary | ICD-10-CM | POA: Insufficient documentation

## 2012-03-28 DIAGNOSIS — K029 Dental caries, unspecified: Secondary | ICD-10-CM | POA: Insufficient documentation

## 2012-03-28 HISTORY — DX: Gastric ulcer, unspecified as acute or chronic, without hemorrhage or perforation: K25.9

## 2012-03-28 MED ORDER — HYDROMORPHONE HCL PF 1 MG/ML IJ SOLN
1.0000 mg | Freq: Once | INTRAMUSCULAR | Status: AC
  Administered 2012-03-28: 1 mg via INTRAMUSCULAR
  Filled 2012-03-28: qty 1

## 2012-03-28 MED ORDER — OXYCODONE-ACETAMINOPHEN 5-325 MG PO TABS: ORAL_TABLET | ORAL | Status: AC

## 2012-03-28 MED ORDER — PENICILLIN V POTASSIUM 250 MG PO TABS: 250.0000 mg | ORAL_TABLET | Freq: Four times a day (QID) | ORAL | Status: AC

## 2012-03-28 NOTE — ED Notes (Signed)
Pt reports toothache to right lower jaw since yesterday.  

## 2012-03-28 NOTE — ED Provider Notes (Signed)
History     CSN: [id number]  Arrival date & time 03/28/12  1355   First MD Initiated Contact with Patient 03/28/12 1508      Chief Complaint  Patient presents with  . Dental Pain    HPI Pt was seen at 1525.  Per pt, c/o gradual onset and persistence of constant right lower tooth "pain" for the past several days, worse since last night.  Denies fevers, no intra-oral edema, no rash, no facial swelling, no dysphagia, no neck pain.   The condition is aggravated by nothing. The condition is relieved by nothing. The symptoms have been associated with no other complaints. The patient has no significant history of serious medical conditions.      Past Medical History  Diagnosis Date  . Gastric ulcer     Past Surgical History  Procedure Date  . Back surgery   . Tonsillectomy   . Breast surgery     History  Substance Use Topics  . Smoking status: Current Everyday Smoker    Types: Cigarettes  . Smokeless tobacco: Not on file  . Alcohol Use: Yes    Review of Systems ROS: Statement: All systems negative except as marked or noted in the HPI; Constitutional: Negative for fever and chills. ; ; Eyes: Negative for eye pain and discharge. ; ; ENMT: Positive for dental caries, dental hygiene poor and toothache. Negative for ear pain, bleeding gums, dental injury, facial deformity, facial swelling, hoarseness, nasal congestion, sinus pressure, sore throat, throat swelling and tongue swollen. ; ; Cardiovascular: Negative for chest pain, palpitations, diaphoresis, dyspnea and peripheral edema. ; ; Respiratory: Negative for cough, wheezing and stridor. ; ; Gastrointestinal: Negative for nausea, vomiting, diarrhea and abdominal pain. ; ; Genitourinary: Negative for dysuria, flank pain and hematuria. ; ; Musculoskeletal: Negative for back pain and neck pain. ; ; Skin: Negative for rash and skin lesion. ; ; Neuro: Negative for headache, lightheadedness and neck stiffness. ;     Allergies  Review of  patient's allergies indicates no known allergies.  Home Medications   Current Outpatient Rx  Name Route Sig Dispense Refill  . DIPHENHYDRAMINE HCL 25 MG PO TABS Oral Take 25 mg by mouth daily as needed. For allergy symptoms    . IBUPROFEN 200 MG PO TABS Oral Take 800 mg by mouth 3 (three) times daily as needed. For pain/fever    . POTASSIUM 99 MG PO TABS Oral Take 2 tablets by mouth daily.      BP 169/116  Pulse 88  Temp 98.6 F (37 C) (Oral)  Resp 18  Ht 5\' 7"  (1.702 m)  Wt 135 lb (61.236 kg)  BMI 21.14 kg/m2  SpO2 100%  LMP 02/21/2012  Physical Exam 1530:  Physical examination: Vital signs and O2 SAT: Reviewed; Constitutional: Well developed, Well nourished, Well hydrated, In no acute distress; Head and Face: Normocephalic, Atraumatic; Eyes: EOMI, PERRL, No scleral icterus; ENMT: Mouth and pharynx normal, Poor dentition, Widespread dental decay, Left TM normal, Right TM normal, Mucous membranes moist, +lower right 1st molar with extensive dental decay.  No gingival erythema, edema, fluctuance, or drainage.  No hoarse voice, no drooling, no stridor.  ; Neck: Supple, Full range of motion, No lymphadenopathy; Cardiovascular: Regular rate and rhythm, No murmur, rub, or gallop; Respiratory: Breath sounds clear & equal bilaterally, No rales, rhonchi, wheezes, or rub, Normal respiratory effort/excursion; Chest: Nontender, Movement normal; Extremities: Pulses normal, No tenderness, No edema; Neuro: AA&Ox3, Major CN grossly intact.  No gross focal  motor or sensory deficits in extremities.; Skin: Color normal, No rash, No petechiae, Warm, Dry   ED Course  Procedures   MDM  MDM Reviewed: nursing note and vitals     1600:  Pt encouraged to keep her f/u appt with her dentist on this Friday for her dental needs for good continuity of care and definitive treatment.  Verb understanding.         Laray Anger, DO 03/29/12 1457

## 2012-06-10 ENCOUNTER — Emergency Department (HOSPITAL_COMMUNITY): Payer: Self-pay

## 2012-06-10 ENCOUNTER — Emergency Department (HOSPITAL_COMMUNITY)
Admission: EM | Admit: 2012-06-10 | Discharge: 2012-06-10 | Disposition: A | Discharge status: Home / Self Care | Payer: Self-pay | Attending: Emergency Medicine | Admitting: Emergency Medicine

## 2012-06-10 ENCOUNTER — Encounter (HOSPITAL_COMMUNITY): Payer: Self-pay | Admitting: Emergency Medicine

## 2012-06-10 DIAGNOSIS — Z8719 Personal history of other diseases of the digestive system: Secondary | ICD-10-CM | POA: Insufficient documentation

## 2012-06-10 DIAGNOSIS — S63509A Unspecified sprain of unspecified wrist, initial encounter: Secondary | ICD-10-CM

## 2012-06-10 DIAGNOSIS — S63599A Other specified sprain of unspecified wrist, initial encounter: Secondary | ICD-10-CM | POA: Insufficient documentation

## 2012-06-10 DIAGNOSIS — Z9889 Other specified postprocedural states: Secondary | ICD-10-CM | POA: Insufficient documentation

## 2012-06-10 DIAGNOSIS — F17200 Nicotine dependence, unspecified, uncomplicated: Secondary | ICD-10-CM | POA: Insufficient documentation

## 2012-06-10 DIAGNOSIS — IMO0002 Reserved for concepts with insufficient information to code with codable children: Secondary | ICD-10-CM | POA: Insufficient documentation

## 2012-06-10 DIAGNOSIS — Y929 Unspecified place or not applicable: Secondary | ICD-10-CM | POA: Insufficient documentation

## 2012-06-10 DIAGNOSIS — Y939 Activity, unspecified: Secondary | ICD-10-CM | POA: Insufficient documentation

## 2012-06-10 MED ORDER — HYDROCODONE-ACETAMINOPHEN 5-325 MG PO TABS
1.0000 | ORAL_TABLET | Freq: Once | ORAL | Status: AC
  Administered 2012-06-10: 1 via ORAL
  Filled 2012-06-10: qty 1

## 2012-06-10 MED ORDER — HYDROCODONE-ACETAMINOPHEN 5-325 MG PO TABS: ORAL_TABLET | ORAL | Status: DC

## 2012-06-10 NOTE — ED Provider Notes (Signed)
History     CSN: [id number]  Arrival date & time 06/10/12  1243   First MD Initiated Contact with Patient 06/10/12 1311      Chief Complaint  Patient presents with  . Wrist Pain    (Consider location/radiation/quality/duration/timing/severity/associated sxs/prior treatment) HPI Comments: Patient c/o injury to her left wrist approximately two weeks ago that occurred after a direct blow to her hand and wrist.  Reports having pain and swelling at that time , but did not seek medical attention.  States she was wearing an ACE wrap to her wrist.  On the day of ED arrival , she states she accidentally struck her wrist again on piece of furnitire at home and noww c/o worsening pain and increased swelling.  Pain is worse with flexion.  She denies pain of the elbow, numbness or weakness of the hand or fingers.  Patient is a 36 y.o. female presenting with wrist pain. The history is provided by the patient.  Wrist Pain This is a recurrent problem. The problem occurs constantly. The problem has been unchanged. Associated symptoms include arthralgias and joint swelling. Pertinent negatives include no chills, fever, neck pain, numbness, rash or weakness. The symptoms are aggravated by bending and twisting. She has tried ice and NSAIDs for the symptoms. The treatment provided mild relief.    Past Medical History  Diagnosis Date  . Gastric ulcer     Past Surgical History  Procedure Date  . Back surgery   . Tonsillectomy   . Breast surgery     History reviewed. No pertinent family history.  History  Substance Use Topics  . Smoking status: Current Every Day Smoker    Types: Cigarettes  . Smokeless tobacco: Not on file  . Alcohol Use: Yes    OB History    Grav Para Term Preterm Abortions TAB SAB Ect Mult Living                  Review of Systems  Constitutional: Negative for fever and chills.  HENT: Negative for neck pain.   Genitourinary: Negative for dysuria and difficulty  urinating.  Musculoskeletal: Positive for joint swelling and arthralgias.  Skin: Negative for color change, rash and wound.  Neurological: Negative for weakness and numbness.  All other systems reviewed and are negative.    Allergies  Shellfish allergy  Home Medications   Current Outpatient Rx  Name  Route  Sig  Dispense  Refill  . IBUPROFEN 200 MG PO TABS   Oral   Take 800 mg by mouth 3 (three) times daily as needed. For pain/fever           BP 141/99  Pulse 81  Temp 97.7 F (36.5 C) (Oral)  Resp 17  Ht 5\' 7"  (1.702 m)  Wt 125 lb (56.7 kg)  BMI 19.58 kg/m2  SpO2 99%  LMP 03/28/2012  Physical Exam  Nursing note and vitals reviewed. Constitutional: She is oriented to person, place, and time. She appears well-developed and well-nourished. No distress.  HENT:  Head: Normocephalic and atraumatic.  Cardiovascular: Normal rate, regular rhythm, normal heart sounds and intact distal pulses.   No murmur heard. Pulmonary/Chest: Effort normal and breath sounds normal.  Musculoskeletal: She exhibits edema and tenderness.       Left lateral wrist is ttp with moderate STS and also ttp over the dorsal surface of the fifth MC.  Radial pulse is brisk, distal sensation intact.  CR<3 sec.  mild bruising   Patient has decreased  volar and dorsiflexion of the wrist.    Neurological: She is alert and oriented to person, place, and time. She exhibits normal muscle tone. Coordination normal.  Skin: Skin is warm and dry.    ED Course  Procedures (including critical care time)  Labs Reviewed - No data to display Dg Wrist Complete Left  06/10/2012  *RADIOLOGY REPORT*  Clinical Data: Left wrist pain.  Ulnar sided pain.  LEFT WRIST - COMPLETE 3+ VIEW  Comparison: None.  Findings: Soft tissue swelling is present over the ulnar aspect of the wrist.  Carpal spacing appears within normal limits.  There is no fracture.  Scaphoid bone is intact.  IMPRESSION: Soft tissue swelling over the ulnar  aspect of the wrist.  No osseous abnormality.   Original Report Authenticated By: Andreas Newport, M.D.      velcro wrist splint applied, pain improved.  Remains NV intact   MDM     Pt agrees to f/u with orthopedics.  Given referral for Dr. Romeo Apple.  I have advised her that a ligamentous injury is possible and close orthopedic f/u is important.  She verbalized understanding and agreed to care plan.  Prescribed: norco #15     Annalyn Blecher L. Trisha Mangle, [place] 06/12/12 1555
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
## 2012-06-10 NOTE — ED Notes (Signed)
Pt was outside when called the first time.

## 2012-06-13 NOTE — ED Provider Notes (Signed)
Medical screening examination/treatment/procedure(s) were performed by non-physician practitioner and as supervising physician I was immediately available for consultation/collaboration.  Donnetta Hutching, MD 06/13/12 [phone]

## 2012-10-05 ENCOUNTER — Encounter (HOSPITAL_COMMUNITY): Payer: Self-pay | Admitting: Radiology

## 2012-10-05 ENCOUNTER — Emergency Department (HOSPITAL_COMMUNITY): Payer: Self-pay

## 2012-10-05 ENCOUNTER — Emergency Department (HOSPITAL_COMMUNITY)
Admission: EM | Admit: 2012-10-05 | Discharge: 2012-10-05 | Disposition: A | Discharge status: Home / Self Care | Payer: Self-pay | Attending: Emergency Medicine | Admitting: Emergency Medicine

## 2012-10-05 DIAGNOSIS — F17200 Nicotine dependence, unspecified, uncomplicated: Secondary | ICD-10-CM | POA: Insufficient documentation

## 2012-10-05 DIAGNOSIS — R259 Unspecified abnormal involuntary movements: Secondary | ICD-10-CM | POA: Insufficient documentation

## 2012-10-05 DIAGNOSIS — R569 Unspecified convulsions: Secondary | ICD-10-CM | POA: Insufficient documentation

## 2012-10-05 DIAGNOSIS — Z860100 Personal history of colon polyps, unspecified: Secondary | ICD-10-CM | POA: Insufficient documentation

## 2012-10-05 DIAGNOSIS — R110 Nausea: Secondary | ICD-10-CM | POA: Insufficient documentation

## 2012-10-05 LAB — BASIC METABOLIC PANEL
GFR calc non Af Amer: >90 mL/min (ref >90)
Glucose, Bld: 85 mg/dL (ref 70–99)
Potassium: 3.5 mEq/L (ref 3.5–5.1)
Sodium: 139 mEq/L (ref 135–145)

## 2012-10-05 LAB — PREGNANCY, URINE: Preg Test, Ur: NEGATIVE

## 2012-10-05 LAB — RAPID URINE DRUG SCREEN, HOSP PERFORMED
Amphetamines: NOT DETECTED
Benzodiazepines: NOT DETECTED
Cocaine: NOT DETECTED
Opiates: POSITIVE — AB

## 2012-10-05 LAB — CBC
Hemoglobin: 12.5 g/dL (ref 12.0–15.0)
MCH: 32.6 pg (ref 26.0–34.0)
RBC: 3.84 MIL/uL — ABNORMAL LOW (ref 3.87–5.11)

## 2012-10-05 MED ORDER — ONDANSETRON 4 MG PO TBDP
4.0000 mg | ORAL_TABLET | Freq: Once | ORAL | Status: AC
  Administered 2012-10-05: 4 mg via ORAL
  Filled 2012-10-05: qty 1

## 2012-10-05 MED ORDER — SODIUM CHLORIDE 0.9 % IV SOLN
Freq: Once | INTRAVENOUS | Status: AC
  Administered 2012-10-05: 15:00:00 via INTRAVENOUS

## 2012-10-05 MED ORDER — LORAZEPAM 2 MG/ML IJ SOLN: 1.0000 mg | Freq: Once | INTRAMUSCULAR | Status: DC

## 2012-10-05 MED ORDER — SODIUM CHLORIDE 0.9 % IV BOLUS (SEPSIS): 1000.0000 mL | Freq: Once | INTRAVENOUS | Status: AC

## 2012-10-05 MED ORDER — LORAZEPAM 2 MG/ML IJ SOLN
1.0000 mg | Freq: Once | INTRAMUSCULAR | Status: AC
  Administered 2012-10-05: 1 mg via INTRAVENOUS

## 2012-10-05 MED ORDER — LORAZEPAM 1 MG PO TABS: 1.0000 mg | ORAL_TABLET | Freq: Three times a day (TID) | ORAL | Status: DC | PRN

## 2012-10-05 MED ORDER — DIPHENHYDRAMINE HCL 50 MG/ML IJ SOLN
50.0000 mg | Freq: Once | INTRAMUSCULAR | Status: AC
  Administered 2012-10-05: 50 mg via INTRAVENOUS

## 2012-10-05 MED ORDER — SODIUM CHLORIDE 0.9 % IV SOLN
INTRAVENOUS | Status: DC
  Administered 2012-10-05: 18:00:00 via INTRAVENOUS

## 2012-10-05 NOTE — ED Notes (Signed)
Pt alert & oriented x4, stable gait. Patient given discharge instructions, paperwork & prescription(s). Patient  instructed to stop at the registration desk to finish any additional paperwork. Patient verbalized understanding. Pt left department w/ no further questions. 

## 2012-10-05 NOTE — ED Notes (Signed)
Pt resting quietly, family at bedside.

## 2012-10-05 NOTE — ED Notes (Signed)
The patient states she is having recurrent nausea.

## 2012-10-05 NOTE — ED Notes (Signed)
Pt states that she started having uncontrollable body movements about 30 minutes ago, states that they stopped on there own the first time and then returned.  Pt visualized at triaged to have arms contracted toward midline, however able to speak and answer questions.  Uncontrollable movements stopped at this time, complains of nausea.

## 2012-10-05 NOTE — ED Provider Notes (Signed)
History     CSN: [id number]  Arrival date & time 10/05/12  1452   First MD Initiated Contact with Patient 10/05/12 1617      Chief Complaint  Patient presents with  . Seizures    (Consider location/radiation/quality/duration/timing/severity/associated sxs/prior treatment) Patient is a 37 y.o. female presenting with seizures. The history is provided by the patient.  Seizures  patient presents requesting a seizure. Patient started having uncontrollable body that was with contractions of her hands predominantly in her upper extremity and some contractions in her feet about 30 minutes prior to arrival to the emergency department they stopped here on their own and then they did return. There has been none since I was by way in the patient. Patient never had any loss of consciousness no incontinence also patient was able to speak through the episodes. Never anything like this before. Associated with some nausea. Matter of fact the initial onset of symptoms of the first one was at 1:30 no headache involved she did have a feeling of some chest discomfort and then nausea and then the contractions extremities occurred following that she did have some hyperventilation but did not occur before. Primary care doctor for her is in the [place] area.  Past Medical History  Diagnosis Date  . Gastric ulcer     Past Surgical History  Procedure Laterality Date  . Back surgery    . Tonsillectomy    . Breast surgery      No family history on file.  History  Substance Use Topics  . Smoking status: Current Every Day Smoker    Types: Cigarettes  . Smokeless tobacco: Not on file  . Alcohol Use: Yes    OB History   Grav Para Term Preterm Abortions TAB SAB Ect Mult Living                  Review of Systems  Constitutional: Negative for fever.  HENT: Negative for congestion, trouble swallowing, neck pain, neck stiffness and voice change.   Eyes: Negative for pain and visual disturbance.   Respiratory: Negative for shortness of breath.   Cardiovascular: Negative for chest pain.  Gastrointestinal: Negative for nausea, vomiting, abdominal pain and diarrhea.  Genitourinary: Negative for dysuria and hematuria.  Musculoskeletal: Negative for back pain.  Neurological: Positive for tremors and seizures. Negative for headaches.  Hematological: Does not bruise/bleed easily.  Psychiatric/Behavioral: Negative for confusion.    Allergies  Review of patient's allergies indicates no active allergies.  Home Medications   Current Outpatient Rx  Name  Route  Sig  Dispense  Refill  . diphenhydrAMINE (BENADRYL) 25 MG tablet   Oral   Take 25 mg by mouth at bedtime as needed for allergies.         Marland Kitchen HYDROcodone-acetaminophen (NORCO) 7.5-325 MG per tablet   Oral   Take 1 tablet by mouth daily as needed for pain.         Marland Kitchen ibuprofen (ADVIL,MOTRIN) 200 MG tablet   Oral   Take 800 mg by mouth daily as needed. For pain/fever         . LORazepam (ATIVAN) 1 MG tablet   Oral   Take 1 tablet (1 mg total) by mouth 3 (three) times daily as needed for anxiety.   10 tablet   0     BP 150/110  Pulse 98  Temp(Src) 98.2 F (36.8 C) (Oral)  Resp 21  SpO2 100%  LMP 09/24/2012  Physical Exam  Nursing note and  vitals reviewed. Constitutional: She is oriented to person, place, and time. She appears well-developed and well-nourished. No distress.  HENT:  Head: Normocephalic and atraumatic.  Mouth/Throat: Oropharynx is clear and moist.  Eyes: Conjunctivae and EOM are normal. Pupils are equal, round, and reactive to light.  Neck: Normal range of motion. No JVD present. No tracheal deviation present. No thyromegaly present.  Cardiovascular: Normal rate, regular rhythm, normal heart sounds and intact distal pulses.   No murmur heard. Pulmonary/Chest: Breath sounds normal. No respiratory distress.  Abdominal: Soft. Bowel sounds are normal. There is no tenderness.  Musculoskeletal:  Normal range of motion. She exhibits no edema and no tenderness.  Lymphadenopathy:    She has no cervical adenopathy.  Neurological: She is alert and oriented to person, place, and time. No cranial nerve deficit. She exhibits normal muscle tone. Coordination normal.  Skin: Skin is warm. No rash noted.    ED Course  Procedures (including critical care time)  Labs Reviewed  CBC - Abnormal; Notable for the following:    RBC 3.84 (*)    HCT 35.7 (*)    All other components within normal limits  URINE RAPID DRUG SCREEN (HOSP PERFORMED) - Abnormal; Notable for the following:    Opiates POSITIVE (*)    Tetrahydrocannabinol POSITIVE (*)    All other components within normal limits  BASIC METABOLIC PANEL  PREGNANCY, URINE   Dg Chest 2 View  10/05/2012  *RADIOLOGY REPORT*  Clinical Data: Possible seizure and nausea.  CHEST - 2 VIEW  Comparison: 12/23/2011  Findings: Cardiomediastinal silhouette is within normal limits. The lungs are free of focal consolidations and pleural effusions. No pulmonary edema. Visualized osseous structures have a normal appearance.  IMPRESSION: No evidence for acute cardiopulmonary abnormality.   Original Report Authenticated By: Norva Pavlov, M.D.    Ct Head Wo Contrast  10/05/2012  *RADIOLOGY REPORT*  Clinical Data: 37 year old female with seizure.  CT HEAD WITHOUT CONTRAST  Technique:  Contiguous axial images were obtained from the base of the skull through the vertex without contrast.  Comparison: 12/18/2009 and prior head CTs  Findings: No intracranial abnormalities are identified, including mass lesion or mass effect, hydrocephalus, extra-axial fluid collection, midline shift, hemorrhage, or acute infarction.  The visualized bony calvarium is unremarkable.  IMPRESSION: Unremarkable noncontrast head CT.   Original Report Authenticated By: Harmon Pier, M.D.    Results for orders placed during the hospital encounter of 10/05/12  BASIC METABOLIC PANEL      Result  Value Range   Sodium 139  135 - 145 mEq/L   Potassium 3.5  3.5 - 5.1 mEq/L   Chloride 101  96 - 112 mEq/L   CO2 26  19 - 32 mEq/L   Glucose, Bld 85  70 - 99 mg/dL   BUN 9  6 - 23 mg/dL   Creatinine, Ser 8.11  0.50 - 1.10 mg/dL   Calcium 8.6  8.4 - 91.4 mg/dL   GFR calc non Af Amer >90  >90 mL/min   GFR calc Af Amer >90  >90 mL/min  CBC      Result Value Range   WBC 9.7  4.0 - 10.5 K/uL   RBC 3.84 (*) 3.87 - 5.11 MIL/uL   Hemoglobin 12.5  12.0 - 15.0 g/dL   HCT 78.2 (*) 95.6 - 21.3 %   MCV 93.0  78.0 - 100.0 fL   MCH 32.6  26.0 - 34.0 pg   MCHC 35.0  30.0 - 36.0 g/dL  RDW 13.4  11.5 - 15.5 %   Platelets 156  150 - 400 K/uL  PREGNANCY, URINE      Result Value Range   Preg Test, Ur NEGATIVE  NEGATIVE  URINE RAPID DRUG SCREEN (HOSP PERFORMED)      Result Value Range   Opiates POSITIVE (*) NONE DETECTED   Cocaine NONE DETECTED  NONE DETECTED   Benzodiazepines NONE DETECTED  NONE DETECTED   Amphetamines NONE DETECTED  NONE DETECTED   Tetrahydrocannabinol POSITIVE (*) NONE DETECTED   Barbiturates NONE DETECTED  NONE DETECTED     1. Seizure       MDM  Certainly symptoms not consistent with generalized seizure because no loss of consciousness. Patient with contractions in the upper extremities and lower extremities happening a couple times today. No incontinence no biting of tongue no loss of consciousness. Workup negative. Patient is on a pain medication already that explains the positive urine drug screen. Preg test negative CBC normal no anemia. Electrolytes were normal including the calcium. Patient's symptoms are more consistent with hypocalcemia however calcium is normal currently.  Referral to neurology provided. Also patient recommended to followup with her primary care Dr. next week for evaluation of thyroid recheck of calcium. Neurology can do the workup for possible focal seizure although this seems very atypical for that. Head CT was normal no evidence of any acute  intracranial abnormalities. Chest x-ray was normal as well. Patient has been stable no acute distress nontoxic in the emergency department.        Shelda Jakes, MD 10/08/12 [phone]

## 2012-10-05 NOTE — ED Notes (Signed)
Pt was placed on bed pan at this time to collect urine sample. Sample was obtained and sent to lab. lisa

## 2013-03-03 ENCOUNTER — Emergency Department (HOSPITAL_COMMUNITY)
Admission: EM | Admit: 2013-03-03 | Discharge: 2013-03-03 | Disposition: A | Discharge status: Home / Self Care | Payer: Self-pay | Attending: Emergency Medicine | Admitting: Emergency Medicine

## 2013-03-03 ENCOUNTER — Encounter (HOSPITAL_COMMUNITY): Payer: Self-pay | Admitting: *Deleted

## 2013-03-03 DIAGNOSIS — F439 Reaction to severe stress, unspecified: Secondary | ICD-10-CM

## 2013-03-03 DIAGNOSIS — F410 Panic disorder [episodic paroxysmal anxiety] without agoraphobia: Secondary | ICD-10-CM | POA: Insufficient documentation

## 2013-03-03 DIAGNOSIS — Z79899 Other long term (current) drug therapy: Secondary | ICD-10-CM | POA: Insufficient documentation

## 2013-03-03 DIAGNOSIS — F419 Anxiety disorder, unspecified: Secondary | ICD-10-CM

## 2013-03-03 DIAGNOSIS — Z8711 Personal history of peptic ulcer disease: Secondary | ICD-10-CM | POA: Insufficient documentation

## 2013-03-03 DIAGNOSIS — F411 Generalized anxiety disorder: Secondary | ICD-10-CM | POA: Insufficient documentation

## 2013-03-03 DIAGNOSIS — F17200 Nicotine dependence, unspecified, uncomplicated: Secondary | ICD-10-CM | POA: Insufficient documentation

## 2013-03-03 MED ORDER — LAMOTRIGINE 25 MG PO TABS: 25.0000 mg | ORAL_TABLET | Freq: Every day | ORAL | Status: DC

## 2013-03-03 MED ORDER — DIAZEPAM 5 MG PO TABS: 5.0000 mg | ORAL_TABLET | Freq: Two times a day (BID) | ORAL | Status: DC | PRN

## 2013-03-03 MED ORDER — DIAZEPAM 5 MG PO TABS
5.0000 mg | ORAL_TABLET | Freq: Once | ORAL | Status: AC
  Administered 2013-03-03: 5 mg via ORAL
  Filled 2013-03-03: qty 1

## 2013-03-03 NOTE — ED Notes (Signed)
Pt sitting on side of bed rocking back & forth. Pt tearful at times during triage, stating she does not understand what is going on. Pt states had a similar occurrence in march.
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
## 2013-03-03 NOTE — ED Notes (Signed)
Pt is crying, Ask pt if she could get a ride home, pt stated she could call her brother.

## 2013-03-03 NOTE — ED Notes (Signed)
Pt states she is having a panic attack. Pt states she wakes during the night with them & has been getting worse. Pt reports weakness in all extremities

## 2013-03-03 NOTE — ED Notes (Signed)
Pt resting calmly w/ eyes closed. Rise & fall of the chest noted. Bed in low position, side rails up x2. NAD noted at this time.  

## 2013-03-03 NOTE — ED Notes (Signed)
Pt ambulated to restroom & returned to room w/ no complications. 

## 2013-09-24 ENCOUNTER — Encounter (HOSPITAL_COMMUNITY): Payer: Self-pay | Admitting: Emergency Medicine

## 2013-09-24 ENCOUNTER — Emergency Department (HOSPITAL_COMMUNITY)
Admission: EM | Admit: 2013-09-24 | Discharge: 2013-09-24 | Disposition: A | Discharge status: Home / Self Care | Payer: Self-pay | Attending: Emergency Medicine | Admitting: Emergency Medicine

## 2013-09-24 ENCOUNTER — Emergency Department (HOSPITAL_COMMUNITY): Payer: Self-pay

## 2013-09-24 DIAGNOSIS — S0993XA Unspecified injury of face, initial encounter: Secondary | ICD-10-CM | POA: Insufficient documentation

## 2013-09-24 DIAGNOSIS — Y939 Activity, unspecified: Secondary | ICD-10-CM | POA: Insufficient documentation

## 2013-09-24 DIAGNOSIS — S0180XA Unspecified open wound of other part of head, initial encounter: Secondary | ICD-10-CM | POA: Insufficient documentation

## 2013-09-24 DIAGNOSIS — S0181XA Laceration without foreign body of other part of head, initial encounter: Secondary | ICD-10-CM

## 2013-09-24 DIAGNOSIS — Z87828 Personal history of other (healed) physical injury and trauma: Secondary | ICD-10-CM | POA: Insufficient documentation

## 2013-09-24 DIAGNOSIS — W1809XA Striking against other object with subsequent fall, initial encounter: Secondary | ICD-10-CM | POA: Insufficient documentation

## 2013-09-24 DIAGNOSIS — Z8719 Personal history of other diseases of the digestive system: Secondary | ICD-10-CM | POA: Insufficient documentation

## 2013-09-24 DIAGNOSIS — Y9229 Other specified public building as the place of occurrence of the external cause: Secondary | ICD-10-CM | POA: Insufficient documentation

## 2013-09-24 DIAGNOSIS — S0990XA Unspecified injury of head, initial encounter: Secondary | ICD-10-CM | POA: Insufficient documentation

## 2013-09-24 DIAGNOSIS — F17200 Nicotine dependence, unspecified, uncomplicated: Secondary | ICD-10-CM | POA: Insufficient documentation

## 2013-09-24 DIAGNOSIS — S199XXA Unspecified injury of neck, initial encounter: Secondary | ICD-10-CM

## 2013-09-24 HISTORY — DX: Unspecified injury of lower back, initial encounter: S39.92XA

## 2013-09-24 MED ORDER — OXYCODONE-ACETAMINOPHEN 5-325 MG PO TABS
2.0000 | ORAL_TABLET | Freq: Once | ORAL | Status: AC
  Administered 2013-09-24: 2 via ORAL
  Filled 2013-09-24: qty 2

## 2013-09-24 MED ORDER — LIDOCAINE-EPINEPHRINE (PF) 2 %-1:200000 IJ SOLN
INTRAMUSCULAR | Status: AC
  Administered 2013-09-24: 20 mL
  Filled 2013-09-24: qty 20

## 2013-09-24 MED ORDER — LIDOCAINE-EPINEPHRINE 2 %-1:100000 IJ SOLN
5.0000 mL | Freq: Once | INTRAMUSCULAR | Status: DC
  Filled 2013-09-24: qty 5.1

## 2013-09-24 MED ORDER — OXYCODONE-ACETAMINOPHEN 5-325 MG PO TABS: 1.0000 | ORAL_TABLET | ORAL | Status: DC | PRN

## 2013-09-24 MED ORDER — IBUPROFEN 400 MG PO TABS
600.0000 mg | ORAL_TABLET | Freq: Once | ORAL | Status: AC
  Administered 2013-09-24: 600 mg via ORAL
  Filled 2013-09-24: qty 2

## 2013-09-24 NOTE — ED Notes (Signed)
Pt wanting to leave, arguing with man in the room, He is trying to calm pt down and telling pt to leave neck collar on. Gave pt warm blanket.

## 2013-09-24 NOTE — ED Notes (Signed)
Xray states pt is trying to pull collar off and threaten to leave

## 2013-09-24 NOTE — ED Notes (Signed)
C collar placed at triage, Pt  Removed x1.

## 2013-09-24 NOTE — ED Notes (Signed)
Used warm wash cloth to clean blood from pt hands and face

## 2013-09-24 NOTE — ED Provider Notes (Signed)
CSN: [id number]     Arrival date & time 09/24/13  2037 History   First MD Initiated Contact with Patient 09/24/13 2043  This chart was scribed for Virgel Manifold, MD by Anastasia Pall, ED Scribe. This patient was seen in room APA08/APA08 and the patient's care was started at 8:54 PM.    No chief complaint on file.  (Consider location/radiation/quality/duration/timing/severity/associated sxs/prior Treatment) The history is provided by the patient. No language interpreter was used.   HPI Comments: Olivia Lester is a 38 y.o. female who presents to the Emergency Department complaining of a large laceration over her right forehead, with active bleeding and swelling, onset earlier tonight after falling off a bar stool hitting her head on the bar. Boyfriend reports pt had LOC. He states they had vodka to drink before going to bar, but pt had not had a drink yet at the bar when she fell. States she was getting up to get drinks and tripped. Per nursing note, pt admitted to drinking heavily this evening.   Pt also reports constant right sided headache and neck pain since the fall. She took Motrin and Goodie powder for body pains prior to incident. She denies abdominal pain, nausea, and any other associated symptoms. She last had tetanus 7 years ago. Boyfriend denies pt having any allergies.   PCP - Neale Burly, MD  Past Medical History  Diagnosis Date  . Gastric ulcer   . Back injury    Past Surgical History  Procedure Laterality Date  . Back surgery    . Tonsillectomy    . Breast surgery     No family history on file. History  Substance Use Topics  . Smoking status: Current Every Day Smoker    Types: Cigarettes  . Smokeless tobacco: Not on file  . Alcohol Use: Yes   OB History   Grav Para Term Preterm Abortions TAB SAB Ect Mult Living                 Review of Systems  All other systems reviewed and are negative.   Allergies  Review of patient's allergies indicates no known  allergies.  Home Medications   Current Outpatient Rx  Name  Route  Sig  Dispense  Refill  . Aspirin-Acetaminophen-Caffeine (GOODY HEADACHE PO)   Oral   Take 1 packet by mouth daily as needed (for pain).         Marland Kitchen diphenhydrAMINE (BENADRYL) 25 MG tablet   Oral   Take 25 mg by mouth at bedtime as needed for sleep.          Marland Kitchen ibuprofen (ADVIL,MOTRIN) 200 MG tablet   Oral   Take 800 mg by mouth daily as needed. For pain/fever          BP 139/99  Pulse 82  Temp(Src) 98.3 F (36.8 C)  Resp 20  SpO2 100%  LMP 09/22/2013  Physical Exam  Nursing note and vitals reviewed. Constitutional: She is oriented to person, place, and time. She appears well-developed and well-nourished. No distress.  HENT:  Head: Normocephalic and atraumatic.  Laceration to right forehead.   Eyes: EOM are normal.  Right periorbital swelling. No apparent bony tenderness.   Neck: Neck supple.  Right lateral neck tenderness. Does not appear to have midline tenderness.   Cardiovascular: Normal rate.   Pulmonary/Chest: Effort normal. No respiratory distress.  Musculoskeletal: Normal range of motion.  Neurological: She is alert and oriented to person, place, and time.  Skin: Skin is  warm and dry.  Psychiatric: She has a normal mood and affect. Her behavior is normal.  Intoxicated.    ED Course  Procedures (including critical care time)  LACERATION REPAIR - complex Performed by: Virgel Manifold Authorized by: Virgel Manifold Consent: Verbal consent obtained. Risks and benefits: risks, benefits and alternatives were discussed Consent given by: patient Patient identity confirmed: provided demographic data Prepped and Draped in normal sterile fashion Wound explored  Laceration Location: R forehead  Laceration Length: 3 cm  No Foreign Bodies seen or palpated  Anesthesia: local infiltration  Local anesthetic: lidocaine 2% w epinephrine  Anesthetic total: 3 ml  Irrigation method:  syringe Amount of cleaning: standard  Skin closure: layered  Number of sutures: 9  Technique: single deep suture with 5-0 vicryl, 8 simple interrupted with 6-0 prolene. Small area of macerated, devitalized tissue sharply debrided with scissors  Patient tolerance: Patient tolerated the procedure well with no immediate complications.   DIAGNOSTIC STUDIES: Oxygen Saturation is 100% on room air, normal by my interpretation.    COORDINATION OF CARE: 8:59 PM-Discussed treatment plan which includes CT head and C-spine, pain medication, and wound repair with pt at bedside and pt agreed to plan.   Results for orders placed during the hospital encounter of 54/27/06  BASIC METABOLIC PANEL      Result Value Ref Range   Sodium 139  135 - 145 mEq/L   Potassium 3.5  3.5 - 5.1 mEq/L   Chloride 101  96 - 112 mEq/L   CO2 26  19 - 32 mEq/L   Glucose, Bld 85  70 - 99 mg/dL   BUN 9  6 - 23 mg/dL   Creatinine, Ser 0.68  0.50 - 1.10 mg/dL   Calcium 8.6  8.4 - 10.5 mg/dL   GFR calc non Af Amer >90  >90 mL/min   GFR calc Af Amer >90  >90 mL/min  CBC      Result Value Ref Range   WBC 9.7  4.0 - 10.5 K/uL   RBC 3.84 (*) 3.87 - 5.11 MIL/uL   Hemoglobin 12.5  12.0 - 15.0 g/dL   HCT 35.7 (*) 36.0 - 46.0 %   MCV 93.0  78.0 - 100.0 fL   MCH 32.6  26.0 - 34.0 pg   MCHC 35.0  30.0 - 36.0 g/dL   RDW 13.4  11.5 - 15.5 %   Platelets 156  150 - 400 K/uL  PREGNANCY, URINE      Result Value Ref Range   Preg Test, Ur NEGATIVE  NEGATIVE  URINE RAPID DRUG SCREEN (HOSP PERFORMED)      Result Value Ref Range   Opiates POSITIVE (*) NONE DETECTED   Cocaine NONE DETECTED  NONE DETECTED   Benzodiazepines NONE DETECTED  NONE DETECTED   Amphetamines NONE DETECTED  NONE DETECTED   Tetrahydrocannabinol POSITIVE (*) NONE DETECTED   Barbiturates NONE DETECTED  NONE DETECTED   Ct Head Wo Contrast  09/24/2013   CLINICAL DATA:  Fall. Loss of consciousness. Right-sided headache and right-sided neck pain.  EXAM: CT HEAD  WITHOUT CONTRAST  CT CERVICAL SPINE WITHOUT CONTRAST  TECHNIQUE: Multidetector CT imaging of the head and cervical spine was performed following the standard protocol without intravenous contrast. Multiplanar CT image reconstructions of the cervical spine were also generated.  COMPARISON:  10/05/2012 head CT 12/18/2009 head CT and cervical spine CT.  FINDINGS: CT HEAD FINDINGS  Right frontal/supraorbital subcutaneous hematoma. No underlying fracture or intracranial hemorrhage.  No CT evidence  of large acute infarct.  No hydrocephalus.  No intracranial mass lesion noted on this unenhanced exam.  Mastoid air cells, middle ear cavities and visualized paranasal sinuses are clear.  CT CERVICAL SPINE FINDINGS  No cervical spine fracture or malalignment.  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 If ligamentous injury or cord injury were of high clinical concern, MR imaging could be obtained for further delineation.  No abnormal prevertebral soft tissue swelling.  IMPRESSION: CT HEAD :  Right frontal/supraorbital subcutaneous hematoma. No underlying fracture or intracranial hemorrhage.  CT CERVICAL SPINE :  No cervical spine fracture or malalignment.  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  Please see above.   Electronically Signed   By: Chauncey Cruel M.D.   On: 09/24/2013 21:47   Ct Cervical Spine Wo Contrast  09/24/2013   CLINICAL DATA:  Fall. Loss of consciousness. Right-sided headache and right-sided neck pain.  EXAM: CT HEAD WITHOUT CONTRAST  CT CERVICAL SPINE WITHOUT CONTRAST  TECHNIQUE: Multidetector CT imaging of the head and cervical spine was performed following the standard protocol without intravenous contrast. Multiplanar CT image reconstructions of the cervical spine were also generated.  COMPARISON:  10/05/2012 head CT 12/18/2009 head CT and cervical spine CT.  FINDINGS: CT HEAD FINDINGS  Right frontal/supraorbital subcutaneous hematoma. No underlying fracture or intracranial hemorrhage.  No  CT evidence of large acute infarct.  No hydrocephalus.  No intracranial mass lesion noted on this unenhanced exam.  Mastoid air cells, middle ear cavities and visualized paranasal sinuses are clear.  CT CERVICAL SPINE FINDINGS  No cervical spine fracture or malalignment.  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 If ligamentous injury or cord injury were of high clinical concern, MR imaging could be obtained for further delineation.  No abnormal prevertebral soft tissue swelling.  IMPRESSION: CT HEAD :  Right frontal/supraorbital subcutaneous hematoma. No underlying fracture or intracranial hemorrhage.  CT CERVICAL SPINE :  No cervical spine fracture or malalignment.  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  Please see above.   Electronically Signed   By: Chauncey Cruel M.D.   On: 09/24/2013 21:47    EKG Interpretation None     Medications  oxyCODONE-acetaminophen (PERCOCET/ROXICET) 5-325 MG per tablet 2 tablet (2 tablets Oral Given 09/24/13 2107)  lidocaine-EPINEPHrine (XYLOCAINE W/EPI) 2 %-1:200000 (PF) injection (20 mLs  Given 09/24/13 2300)  ibuprofen (ADVIL,MOTRIN) tablet 600 mg (600 mg Oral Given 09/24/13 2259)    MDM   Final diagnoses:  Head injury  Facial laceration   38 year-old female with mechanical fall. Imaging negative for emergent traumatic injury. forehead laceration was repaired. Continued wound care. Outpatient followup as needed and for suture removal.  I personally preformed the services scribed in my presence. The recorded information has been reviewed is accurate. Virgel Manifold, MD.    Virgel Manifold, MD 09/27/13 1016

## 2013-09-24 NOTE — Discharge Instructions (Signed)
Head Injury, Adult °You have received a head injury. It does not appear serious at this time. Headaches and vomiting are common following head injury. It should be easy to awaken from sleeping. Sometimes it is necessary for you to stay in the emergency department for a while for observation. Sometimes admission to the hospital may be needed. After injuries such as yours, most problems occur within the first 24 hours, but side effects may occur up to 7 10 days after the injury. It is important for you to carefully monitor your condition and contact your health care provider or seek immediate medical care if there is a change in your condition. °WHAT ARE THE TYPES OF HEAD INJURIES? °Head injuries can be as minor as a bump. Some head injuries can be more severe. More severe head injuries include: °· A jarring injury to the brain (concussion). °· A bruise of the brain (contusion). This mean there is bleeding in the brain that can cause swelling. °· A cracked skull (skull fracture). °· Bleeding in the brain that collects, clots, and forms a bump (hematoma). °WHAT CAUSES A HEAD INJURY? °A serious head injury is most likely to happen to someone who is in a car wreck and is not wearing a seat belt. Other causes of major head injuries include bicycle or motorcycle accidents, sports injuries, and falls. °HOW ARE HEAD INJURIES DIAGNOSED? °A complete history of the event leading to the injury and your current symptoms will be helpful in diagnosing head injuries. Many times, pictures of the brain, such as CT or MRI are needed to see the extent of the injury. Often, an overnight hospital stay is necessary for observation.  °WHEN SHOULD I SEEK IMMEDIATE MEDICAL CARE?  °You should get help right away if: °· You have confusion or drowsiness. °· You feel sick to your stomach (nauseous) or have continued, forceful vomiting. °· You have dizziness or unsteadiness that is getting worse. °· You have severe, continued headaches not  relieved by medicine. Only take over-the-counter or prescription medicines for pain, fever, or discomfort as directed by your health care provider. °· You do not have normal function of the arms or legs or are unable to walk. °· You notice changes in the black spots in the center of the colored part of your eye (pupil). °· You have a clear or bloody fluid coming from your nose or ears. °· You have a loss of vision. °During the next 24 hours after the injury, you must stay with someone who can watch you for the warning signs. This person should contact local emergency services (911 in the [place] if you have seizures, you become unconscious, or you are unable to wake up. °HOW CAN I PREVENT A HEAD INJURY IN THE FUTURE? °The most important factor for preventing major head injuries is avoiding motor vehicle accidents.  To minimize the potential for damage to your head, it is crucial to wear seat belts while riding in motor vehicles. Wearing helmets while bike riding and playing collision sports (like football) is also helpful. Also, avoiding dangerous activities around the house will further help reduce your risk of head injury.  °WHEN CAN I RETURN TO NORMAL ACTIVITIES AND ATHLETICS? °You should be reevaluated by your health care provider before returning to these activities. If you have any of the following symptoms, you should not return to activities or contact sports until 1 week after the symptoms have stopped: °· Persistent headache. °· Dizziness or vertigo. °· Poor attention and concentration. °·   Confusion. °· Memory problems. °· Nausea or vomiting. °· Fatigue or tire easily. °· Irritability. °· Intolerant of bright lights or loud noises. °· Anxiety or depression. °· Disturbed sleep. °MAKE SURE YOU:  °· Understand these instructions. °· Will watch your condition. °· Will get help right away if you are not doing well or get worse. °Document Released: 07/11/2005 Document Revised: 05/01/2013 Document Reviewed:  03/18/2013 °[employer]® Patient Information ©2014 [employer]. ° °Facial Laceration ° A facial laceration is a cut on the face. These injuries can be painful and cause bleeding. Lacerations usually heal quickly, but they need special care to reduce scarring. °DIAGNOSIS  °Your health care provider will take a medical history, ask for details about how the injury occurred, and examine the wound to determine how deep the cut is. °TREATMENT  °Some facial lacerations may not require closure. Others may not be able to be closed because of an increased risk of infection. The risk of infection and the chance for successful closure will depend on various factors, including the amount of time since the injury occurred. °The wound may be cleaned to help prevent infection. If closure is appropriate, pain medicines may be given if needed. Your health care provider will use stitches (sutures), wound glue (adhesive), or skin adhesive strips to repair the laceration. These tools bring the skin edges together to allow for faster healing and a better cosmetic outcome. If needed, you may also be given a tetanus shot. °HOME CARE INSTRUCTIONS °· Only take over-the-counter or prescription medicines as directed by your health care provider. °· Follow your health care provider's instructions for wound care. These instructions will vary depending on the technique used for closing the wound. °For Sutures: °· Keep the wound clean and dry.   °· If you were given a bandage (dressing), you should change it at least once a day. Also change the dressing if it becomes wet or dirty, or as directed by your health care provider.   °· Wash the wound with soap and water 2 times a day. Rinse the wound off with water to remove all soap. Pat the wound dry with a clean towel.   °· After cleaning, apply a thin layer of the antibiotic ointment recommended by your health care provider. This will help prevent infection and keep the dressing from sticking.    °· You may shower as usual after the first 24 hours. Do not soak the wound in water until the sutures are removed.   °· Get your sutures removed as directed by your health care provider. With facial lacerations, sutures should usually be taken out after 4 5 days to avoid stitch marks.   °· Wait a few days after your sutures are removed before applying any makeup. °For Skin Adhesive Strips: °· Keep the wound clean and dry.   °· Do not get the skin adhesive strips wet. You may bathe carefully, using caution to keep the wound dry.   °· If the wound gets wet, pat it dry with a clean towel.   °· Skin adhesive strips will fall off on their own. You may trim the strips as the wound heals. Do not remove skin adhesive strips that are still stuck to the wound. They will fall off in time.   °For Wound Adhesive: °· You may briefly wet your wound in the shower or bath. Do not soak or scrub the wound. Do not swim. Avoid periods of heavy sweating until the skin adhesive has fallen off on its own. After showering or bathing, gently pat the wound   dry with a clean towel.   °· Do not apply liquid medicine, cream medicine, ointment medicine, or makeup to your wound while the skin adhesive is in place. This may loosen the film before your wound is healed.   °· If a dressing is placed over the wound, be careful not to apply tape directly over the skin adhesive. This may cause the adhesive to be pulled off before the wound is healed.   °· Avoid prolonged exposure to sunlight or tanning lamps while the skin adhesive is in place. °· The skin adhesive will usually remain in place for 5 10 days, then naturally fall off the skin. Do not pick at the adhesive film.   °After Healing: °Once the wound has healed, cover the wound with sunscreen during the day for 1 full year. This can help minimize scarring. Exposure to ultraviolet light in the first year will darken the scar. It can take 1 2 years for the scar to lose its redness and to heal  completely.  °SEEK IMMEDIATE MEDICAL CARE IF: °· You have redness, pain, or swelling around the wound.   °· You see a yellowish-white fluid (pus) coming from the wound.   °· You have chills or a fever.   °MAKE SURE YOU: °· Understand these instructions. °· Will watch your condition. °· Will get help right away if you are not doing well or get worse. °Document Released: 08/18/2004 Document Revised: 05/01/2013 Document Reviewed: 02/21/2013 °[employer]® Patient Information ©2014 [employer]. ° °

## 2013-09-24 NOTE — ED Notes (Signed)
Pt admits to drinking heavily tonight, and she fell off of a bar stool hitting her head on the bar, has a large lac and swelling to right forehead above right eye.  There was stated loss of consciousness per her boyfriend who was present and brought her here.

## 2013-09-24 NOTE — ED Notes (Signed)
Suture cart at the bedside.  

## 2013-09-24 NOTE — ED Notes (Signed)
Patient given discharge instruction, verbalized understand. Patient ambulatory out of the department.  

## 2013-09-25 ENCOUNTER — Emergency Department (HOSPITAL_COMMUNITY)
Admission: EM | Admit: 2013-09-25 | Discharge: 2013-09-25 | Disposition: A | Discharge status: Home / Self Care | Payer: Self-pay | Attending: Emergency Medicine | Admitting: Emergency Medicine

## 2013-09-25 ENCOUNTER — Emergency Department (HOSPITAL_COMMUNITY): Payer: Self-pay

## 2013-09-25 ENCOUNTER — Encounter (HOSPITAL_COMMUNITY): Payer: Self-pay | Admitting: Emergency Medicine

## 2013-09-25 DIAGNOSIS — R220 Localized swelling, mass and lump, head: Secondary | ICD-10-CM | POA: Insufficient documentation

## 2013-09-25 DIAGNOSIS — R51 Headache: Secondary | ICD-10-CM | POA: Insufficient documentation

## 2013-09-25 DIAGNOSIS — F1010 Alcohol abuse, uncomplicated: Secondary | ICD-10-CM | POA: Insufficient documentation

## 2013-09-25 DIAGNOSIS — R221 Localized swelling, mass and lump, neck: Secondary | ICD-10-CM

## 2013-09-25 DIAGNOSIS — M542 Cervicalgia: Secondary | ICD-10-CM | POA: Insufficient documentation

## 2013-09-25 DIAGNOSIS — Z87828 Personal history of other (healed) physical injury and trauma: Secondary | ICD-10-CM | POA: Insufficient documentation

## 2013-09-25 DIAGNOSIS — F17200 Nicotine dependence, unspecified, uncomplicated: Secondary | ICD-10-CM | POA: Insufficient documentation

## 2013-09-25 DIAGNOSIS — R4182 Altered mental status, unspecified: Secondary | ICD-10-CM | POA: Insufficient documentation

## 2013-09-25 DIAGNOSIS — R112 Nausea with vomiting, unspecified: Secondary | ICD-10-CM | POA: Insufficient documentation

## 2013-09-25 DIAGNOSIS — H538 Other visual disturbances: Secondary | ICD-10-CM | POA: Insufficient documentation

## 2013-09-25 DIAGNOSIS — G8911 Acute pain due to trauma: Secondary | ICD-10-CM | POA: Insufficient documentation

## 2013-09-25 DIAGNOSIS — Z8719 Personal history of other diseases of the digestive system: Secondary | ICD-10-CM | POA: Insufficient documentation

## 2013-09-25 DIAGNOSIS — S0990XA Unspecified injury of head, initial encounter: Secondary | ICD-10-CM

## 2013-09-25 LAB — CBC WITH DIFFERENTIAL/PLATELET
Basophils Absolute: 0 10*3/uL (ref 0.0–0.1)
Basophils Relative: 0 % (ref 0–1)
Eosinophils Absolute: 0.1 10*3/uL (ref 0.0–0.7)
Eosinophils Relative: 1 % (ref 0–5)
HEMATOCRIT: 42.3 % (ref 36.0–46.0)
HEMOGLOBIN: 14.4 g/dL (ref 12.0–15.0)
LYMPHS ABS: 3.5 10*3/uL (ref 0.7–4.0)
LYMPHS PCT: 43 % (ref 12–46)
MCH: 33.5 pg (ref 26.0–34.0)
MCHC: 34 g/dL (ref 30.0–36.0)
MCV: 98.4 fL (ref 78.0–100.0)
MONO ABS: 0.5 10*3/uL (ref 0.1–1.0)
MONOS PCT: 6 % (ref 3–12)
NEUTROS ABS: 4.1 10*3/uL (ref 1.7–7.7)
NEUTROS PCT: 50 % (ref 43–77)
Platelets: 149 10*3/uL — ABNORMAL LOW (ref 150–400)
RBC: 4.3 MIL/uL (ref 3.87–5.11)
RDW: 13 % (ref 11.5–15.5)
WBC: 8.2 10*3/uL (ref 4.0–10.5)

## 2013-09-25 LAB — BASIC METABOLIC PANEL
BUN: 10 mg/dL (ref 6–23)
CHLORIDE: 105 meq/L (ref 96–112)
CO2: 24 meq/L (ref 19–32)
Calcium: 8.8 mg/dL (ref 8.4–10.5)
Creatinine, Ser: 0.56 mg/dL (ref 0.50–1.10)
GFR calc non Af Amer: >90 mL/min (ref >90)
GLUCOSE: 80 mg/dL (ref 70–99)
POTASSIUM: 3.7 meq/L (ref 3.7–5.3)
Sodium: 143 mEq/L (ref 137–147)

## 2013-09-25 LAB — ETHANOL: Alcohol, Ethyl (B): 289 mg/dL — ABNORMAL HIGH (ref 0–11)

## 2013-09-25 MED ORDER — ONDANSETRON HCL 4 MG/2ML IJ SOLN
4.0000 mg | Freq: Once | INTRAMUSCULAR | Status: AC
  Administered 2013-09-25: 4 mg via INTRAVENOUS
  Filled 2013-09-25: qty 2

## 2013-09-25 MED ORDER — SODIUM CHLORIDE 0.9 % IV BOLUS (SEPSIS)
1000.0000 mL | Freq: Once | INTRAVENOUS | Status: AC
  Administered 2013-09-25: 1000 mL via INTRAVENOUS

## 2013-09-25 MED ORDER — KETOROLAC TROMETHAMINE 60 MG/2ML IM SOLN
60.0000 mg | Freq: Once | INTRAMUSCULAR | Status: DC
  Filled 2013-09-25: qty 2

## 2013-09-25 NOTE — Discharge Instructions (Signed)
Do not take pain medicine and drink alcohol.  Follow up with your md next week for recheck

## 2013-09-25 NOTE — ED Notes (Signed)
Patient was seen here last night for head injury. Sister brought her back tonight and states patient has confusion, blurry vision, and vomiting. States she is taking the prescribed percocet and drank three beers today.

## 2013-09-25 NOTE — ED Notes (Signed)
Pt was upset due to not getting anything for pain and upset the edp brought up that she was drunk. Pt left after iv was taken out. She states that she "ain't signing shit" and walked out of the ED with friend who is driving her home.

## 2013-09-25 NOTE — ED Provider Notes (Signed)
CSN: [id number]     Arrival date & time 09/25/13  1934 History  This chart was scribed for Maudry Diego, MD by Rolanda Lundborg, ED Scribe. This patient was seen in room APA10/APA10 and the patient's care was started at 8:07 PM.    Chief Complaint  Patient presents with  . Head Injury  . Altered Mental Status   Patient is a 38 y.o. female presenting with vomiting. The history is provided by the patient. No language interpreter was used.  Emesis Severity:  Mild Duration:  2 hours Emesis appearance: none- dry heaving. Progression:  Unchanged Chronicity:  New Relieved by:  Nothing Associated symptoms: no abdominal pain, no diarrhea and no headaches    HPI Comments: Olivia Lester is a 38 y.o. female who presents to the Emergency Department complaining of nausea and dry heaving since 2 hours ago. She also reports blurred vision in the right eye. Pt was seen here last night for head injury from falling and hitting the corner of the pool table and a cement floor. She was given stitches on a laceration to her forehead and pain medicine.    Past Medical History  Diagnosis Date  . Gastric ulcer   . Back injury    Past Surgical History  Procedure Laterality Date  . Back surgery    . Tonsillectomy    . Breast surgery     History reviewed. No pertinent family history. History  Substance Use Topics  . Smoking status: Current Every Day Smoker    Types: Cigarettes  . Smokeless tobacco: Not on file  . Alcohol Use: Yes   OB History   Grav Para Term Preterm Abortions TAB SAB Ect Mult Living                 Review of Systems  Constitutional: Negative for appetite change and fatigue.  HENT: Negative for congestion, ear discharge and sinus pressure.   Eyes: Positive for visual disturbance. Negative for discharge.  Respiratory: Negative for cough.   Cardiovascular: Negative for chest pain.  Gastrointestinal: Positive for nausea and vomiting. Negative for abdominal pain and diarrhea.   Genitourinary: Negative for frequency and hematuria.  Musculoskeletal: Negative for back pain.  Skin: Negative for rash.  Neurological: Negative for seizures and headaches.  Psychiatric/Behavioral: Negative for hallucinations.      Allergies  Review of patient's allergies indicates no known allergies.  Home Medications   Current Outpatient Rx  Name  Route  Sig  Dispense  Refill  . Aspirin-Acetaminophen-Caffeine (GOODY HEADACHE PO)   Oral   Take 1 packet by mouth daily as needed (for pain).         Marland Kitchen diphenhydrAMINE (BENADRYL) 25 MG tablet   Oral   Take 25 mg by mouth at bedtime as needed for sleep.          Marland Kitchen ibuprofen (ADVIL,MOTRIN) 200 MG tablet   Oral   Take 800 mg by mouth daily as needed. For pain/fever         . oxyCODONE-acetaminophen (PERCOCET/ROXICET) 5-325 MG per tablet   Oral   Take 1-2 tablets by mouth every 4 (four) hours as needed for severe pain.   10 tablet   0    BP 152/109  Pulse 89  Temp(Src) 98 F (36.7 C) (Oral)  Resp 20  Ht 5\' 7"  (1.702 m)  Wt 130 lb (58.968 kg)  BMI 20.36 kg/m2  SpO2 100%  LMP 09/22/2013 Physical Exam  Constitutional: She is oriented to person,  place, and time. She appears well-developed.  HENT:  Head: Normocephalic.  Swelling to right forehead with suture to laceration.   Eyes: Conjunctivae and EOM are normal. No scleral icterus.  Neck: Neck supple. No thyromegaly present.  Some tenderness to right lateral neck  Cardiovascular: Normal rate and regular rhythm.  Exam reveals no gallop and no friction rub.   No murmur heard. Pulmonary/Chest: No stridor. She has no wheezes. She has no rales. She exhibits no tenderness.  Abdominal: She exhibits no distension. There is no tenderness. There is no rebound.  Musculoskeletal: Normal range of motion. She exhibits no edema.  Lymphadenopathy:    She has no cervical adenopathy.  Neurological: She is oriented to person, place, and time. She exhibits normal muscle tone.  Coordination normal.  Skin: No rash noted. No erythema.  Psychiatric: She has a normal mood and affect. Her behavior is normal.    ED Course  Procedures (including critical care time) Medications  ondansetron (ZOFRAN) injection 4 mg (not administered)  sodium chloride 0.9 % bolus 1,000 mL (not administered)    DIAGNOSTIC STUDIES: Oxygen Saturation is 100% on RA, normal by my interpretation.    COORDINATION OF CARE: 8:15 PM- Discussed treatment plan with pt. Pt agrees to plan.    Labs Review Labs Reviewed - No data to display Imaging Review Ct Head Wo Contrast  09/24/2013   CLINICAL DATA:  Fall. Loss of consciousness. Right-sided headache and right-sided neck pain.  EXAM: CT HEAD WITHOUT CONTRAST  CT CERVICAL SPINE WITHOUT CONTRAST  TECHNIQUE: Multidetector CT imaging of the head and cervical spine was performed following the standard protocol without intravenous contrast. Multiplanar CT image reconstructions of the cervical spine were also generated.  COMPARISON:  10/05/2012 head CT 12/18/2009 head CT and cervical spine CT.  FINDINGS: CT HEAD FINDINGS  Right frontal/supraorbital subcutaneous hematoma. No underlying fracture or intracranial hemorrhage.  No CT evidence of large acute infarct.  No hydrocephalus.  No intracranial mass lesion noted on this unenhanced exam.  Mastoid air cells, middle ear cavities and visualized paranasal sinuses are clear.  CT CERVICAL SPINE FINDINGS  No cervical spine fracture or malalignment.  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 If ligamentous injury or cord injury were of high clinical concern, MR imaging could be obtained for further delineation.  No abnormal prevertebral soft tissue swelling.  IMPRESSION: CT HEAD :  Right frontal/supraorbital subcutaneous hematoma. No underlying fracture or intracranial hemorrhage.  CT CERVICAL SPINE :  No cervical spine fracture or malalignment.  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  Please see above.    Electronically Signed   By: Chauncey Cruel M.D.   On: 09/24/2013 21:47   Ct Cervical Spine Wo Contrast  09/24/2013   CLINICAL DATA:  Fall. Loss of consciousness. Right-sided headache and right-sided neck pain.  EXAM: CT HEAD WITHOUT CONTRAST  CT CERVICAL SPINE WITHOUT CONTRAST  TECHNIQUE: Multidetector CT imaging of the head and cervical spine was performed following the standard protocol without intravenous contrast. Multiplanar CT image reconstructions of the cervical spine were also generated.  COMPARISON:  10/05/2012 head CT 12/18/2009 head CT and cervical spine CT.  FINDINGS: CT HEAD FINDINGS  Right frontal/supraorbital subcutaneous hematoma. No underlying fracture or intracranial hemorrhage.  No CT evidence of large acute infarct.  No hydrocephalus.  No intracranial mass lesion noted on this unenhanced exam.  Mastoid air cells, middle ear cavities and visualized paranasal sinuses are clear.  CT CERVICAL SPINE FINDINGS  No cervical spine fracture or malalignment.  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 If ligamentous injury or cord injury were of high clinical concern, MR imaging could be obtained for further delineation.  No abnormal prevertebral soft tissue swelling.  IMPRESSION: CT HEAD :  Right frontal/supraorbital subcutaneous hematoma. No underlying fracture or intracranial hemorrhage.  CT CERVICAL SPINE :  No cervical spine fracture or malalignment.  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  Please see above.   Electronically Signed   By: Chauncey Cruel M.D.   On: 09/24/2013 21:47     EKG Interpretation None      MDM   Final diagnoses:  None   etoh abuse with head injury yesterday.  Follow up with pcp.  Advised no etoh The chart was scribed for me under my direct supervision.  I personally performed the history, physical, and medical decision making and all procedures in the evaluation of this patient.Maudry Diego, MD 09/25/13 [phone]

## 2013-10-12 ENCOUNTER — Emergency Department (HOSPITAL_COMMUNITY): Payer: Self-pay

## 2013-10-12 ENCOUNTER — Inpatient Hospital Stay (HOSPITAL_COMMUNITY)
Admission: EM | Admit: 2013-10-12 | Discharge: 2013-10-15 | DRG: 384 | Disposition: A | Discharge status: Home / Self Care | Payer: Self-pay | Attending: Internal Medicine | Admitting: Internal Medicine

## 2013-10-12 ENCOUNTER — Encounter (HOSPITAL_COMMUNITY): Payer: Self-pay | Admitting: Emergency Medicine

## 2013-10-12 DIAGNOSIS — K7010 Alcoholic hepatitis without ascites: Secondary | ICD-10-CM | POA: Diagnosis present

## 2013-10-12 DIAGNOSIS — Z981 Arthrodesis status: Secondary | ICD-10-CM

## 2013-10-12 DIAGNOSIS — R748 Abnormal levels of other serum enzymes: Secondary | ICD-10-CM | POA: Diagnosis present

## 2013-10-12 DIAGNOSIS — E876 Hypokalemia: Secondary | ICD-10-CM | POA: Diagnosis present

## 2013-10-12 DIAGNOSIS — D696 Thrombocytopenia, unspecified: Secondary | ICD-10-CM

## 2013-10-12 DIAGNOSIS — K2970 Gastritis, unspecified, without bleeding: Secondary | ICD-10-CM | POA: Diagnosis present

## 2013-10-12 DIAGNOSIS — T3995XA Adverse effect of unspecified nonopioid analgesic, antipyretic and antirheumatic, initial encounter: Secondary | ICD-10-CM | POA: Diagnosis present

## 2013-10-12 DIAGNOSIS — M545 Low back pain, unspecified: Secondary | ICD-10-CM | POA: Diagnosis present

## 2013-10-12 DIAGNOSIS — B1920 Unspecified viral hepatitis C without hepatic coma: Secondary | ICD-10-CM

## 2013-10-12 DIAGNOSIS — R740 Nonspecific elevation of levels of transaminase and lactic acid dehydrogenase [LDH]: Secondary | ICD-10-CM

## 2013-10-12 DIAGNOSIS — R1013 Epigastric pain: Secondary | ICD-10-CM

## 2013-10-12 DIAGNOSIS — R7401 Elevation of levels of liver transaminase levels: Secondary | ICD-10-CM | POA: Diagnosis present

## 2013-10-12 DIAGNOSIS — K259 Gastric ulcer, unspecified as acute or chronic, without hemorrhage or perforation: Principal | ICD-10-CM

## 2013-10-12 DIAGNOSIS — K2980 Duodenitis without bleeding: Secondary | ICD-10-CM

## 2013-10-12 DIAGNOSIS — F17200 Nicotine dependence, unspecified, uncomplicated: Secondary | ICD-10-CM | POA: Diagnosis present

## 2013-10-12 DIAGNOSIS — K2990 Gastroduodenitis, unspecified, without bleeding: Secondary | ICD-10-CM

## 2013-10-12 DIAGNOSIS — G8929 Other chronic pain: Secondary | ICD-10-CM | POA: Diagnosis present

## 2013-10-12 DIAGNOSIS — R7402 Elevation of levels of lactic acid dehydrogenase (LDH): Secondary | ICD-10-CM | POA: Diagnosis present

## 2013-10-12 DIAGNOSIS — F1020 Alcohol dependence, uncomplicated: Secondary | ICD-10-CM

## 2013-10-12 DIAGNOSIS — F411 Generalized anxiety disorder: Secondary | ICD-10-CM | POA: Diagnosis present

## 2013-10-12 DIAGNOSIS — K700 Alcoholic fatty liver: Secondary | ICD-10-CM | POA: Diagnosis present

## 2013-10-12 DIAGNOSIS — D279 Benign neoplasm of unspecified ovary: Secondary | ICD-10-CM | POA: Diagnosis present

## 2013-10-12 HISTORY — DX: Unspecified viral hepatitis C without hepatic coma: B19.20

## 2013-10-12 LAB — COMPREHENSIVE METABOLIC PANEL
ALK PHOS: 72 U/L (ref 39–117)
ALT: 101 U/L — ABNORMAL HIGH (ref 0–35)
AST: 144 U/L — AB (ref 0–37)
Albumin: 4.3 g/dL (ref 3.5–5.2)
BUN: 8 mg/dL (ref 6–23)
CO2: 22 meq/L (ref 19–32)
Calcium: 9.9 mg/dL (ref 8.4–10.5)
Chloride: 99 mEq/L (ref 96–112)
Creatinine, Ser: 0.57 mg/dL (ref 0.50–1.10)
GFR calc non Af Amer: >90 mL/min (ref >90)
GLUCOSE: 124 mg/dL — AB (ref 70–99)
POTASSIUM: 3.6 meq/L — AB (ref 3.7–5.3)
SODIUM: 139 meq/L (ref 137–147)
Total Bilirubin: 0.4 mg/dL (ref 0.3–1.2)
Total Protein: 8 g/dL (ref 6.0–8.3)

## 2013-10-12 LAB — CBC WITH DIFFERENTIAL/PLATELET
BASOS ABS: 0 10*3/uL (ref 0.0–0.1)
Basophils Relative: 0 % (ref 0–1)
Eosinophils Absolute: 0 10*3/uL (ref 0.0–0.7)
Eosinophils Relative: 0 % (ref 0–5)
HCT: 43.8 % (ref 36.0–46.0)
Hemoglobin: 15.5 g/dL — ABNORMAL HIGH (ref 12.0–15.0)
LYMPHS ABS: 1.2 10*3/uL (ref 0.7–4.0)
LYMPHS PCT: 12 % (ref 12–46)
MCH: 33.7 pg (ref 26.0–34.0)
MCHC: 35.4 g/dL (ref 30.0–36.0)
MCV: 95.2 fL (ref 78.0–100.0)
Monocytes Absolute: 0.4 10*3/uL (ref 0.1–1.0)
Monocytes Relative: 4 % (ref 3–12)
NEUTROS PCT: 84 % — AB (ref 43–77)
Neutro Abs: 8.5 10*3/uL — ABNORMAL HIGH (ref 1.7–7.7)
PLATELETS: 126 10*3/uL — AB (ref 150–400)
RBC: 4.6 MIL/uL (ref 3.87–5.11)
RDW: 12.8 % (ref 11.5–15.5)
WBC: 10.1 10*3/uL (ref 4.0–10.5)

## 2013-10-12 LAB — RAPID URINE DRUG SCREEN, HOSP PERFORMED
Amphetamines: NOT DETECTED
BARBITURATES: NOT DETECTED
BENZODIAZEPINES: NOT DETECTED
COCAINE: NOT DETECTED
OPIATES: NOT DETECTED
Tetrahydrocannabinol: POSITIVE — AB

## 2013-10-12 LAB — URINALYSIS, ROUTINE W REFLEX MICROSCOPIC
Glucose, UA: NEGATIVE mg/dL
HGB URINE DIPSTICK: NEGATIVE
Leukocytes, UA: NEGATIVE
Nitrite: NEGATIVE
PH: 6 (ref 5.0–8.0)
PROTEIN: NEGATIVE mg/dL
Specific Gravity, Urine: 1.025 (ref 1.005–1.030)
Urobilinogen, UA: 0.2 mg/dL (ref 0.0–1.0)

## 2013-10-12 LAB — TROPONIN I: Troponin I: <0.3 ng/mL (ref <0.30)

## 2013-10-12 LAB — PREGNANCY, URINE: Preg Test, Ur: NEGATIVE

## 2013-10-12 LAB — LIPASE, BLOOD: Lipase: 74 U/L — ABNORMAL HIGH (ref 11–59)

## 2013-10-12 MED ORDER — VITAMIN B-1 100 MG PO TABS
100.0000 mg | ORAL_TABLET | Freq: Every day | ORAL | Status: DC
  Administered 2013-10-15: 100 mg via ORAL
  Filled 2013-10-12 (×3): qty 1

## 2013-10-12 MED ORDER — ADULT MULTIVITAMIN W/MINERALS CH
1.0000 | ORAL_TABLET | Freq: Every day | ORAL | Status: DC
  Administered 2013-10-15: 1 via ORAL
  Filled 2013-10-12 (×3): qty 1

## 2013-10-12 MED ORDER — PANTOPRAZOLE SODIUM 40 MG IV SOLR
40.0000 mg | Freq: Two times a day (BID) | INTRAVENOUS | Status: DC
Start: 2013-10-12 — End: 2013-10-14
  Administered 2013-10-12 – 2013-10-14 (×4): 40 mg via INTRAVENOUS
  Filled 2013-10-12 (×4): qty 40

## 2013-10-12 MED ORDER — IOHEXOL 300 MG/ML  SOLN
50.0000 mL | Freq: Once | INTRAMUSCULAR | Status: AC | PRN
  Administered 2013-10-12: 50 mL via ORAL

## 2013-10-12 MED ORDER — ONDANSETRON HCL 4 MG/2ML IJ SOLN: 4.0000 mg | Freq: Four times a day (QID) | INTRAMUSCULAR | Status: DC | PRN

## 2013-10-12 MED ORDER — SODIUM CHLORIDE 0.9 % IV SOLN: INTRAVENOUS | Status: DC

## 2013-10-12 MED ORDER — LORAZEPAM 1 MG PO TABS
1.0000 mg | ORAL_TABLET | Freq: Four times a day (QID) | ORAL | Status: DC | PRN
  Administered 2013-10-13: 1 mg via ORAL
  Filled 2013-10-12: qty 1

## 2013-10-12 MED ORDER — HEPARIN SODIUM (PORCINE) 5000 UNIT/ML IJ SOLN: 5000.0000 [IU] | Freq: Three times a day (TID) | INTRAMUSCULAR | Status: DC

## 2013-10-12 MED ORDER — MORPHINE SULFATE 4 MG/ML IJ SOLN
4.0000 mg | INTRAMUSCULAR | Status: DC | PRN
  Administered 2013-10-12 – 2013-10-13 (×3): 4 mg via INTRAVENOUS
  Filled 2013-10-12 (×4): qty 1

## 2013-10-12 MED ORDER — ONDANSETRON HCL 4 MG/2ML IJ SOLN
4.0000 mg | Freq: Four times a day (QID) | INTRAMUSCULAR | Status: AC | PRN
  Administered 2013-10-13: 4 mg via INTRAVENOUS
  Filled 2013-10-12: qty 2

## 2013-10-12 MED ORDER — LORAZEPAM 2 MG/ML IJ SOLN
0.0000 mg | Freq: Two times a day (BID) | INTRAMUSCULAR | Status: DC
  Administered 2013-10-14 – 2013-10-15 (×2): 2 mg via INTRAVENOUS
  Filled 2013-10-12 (×3): qty 1

## 2013-10-12 MED ORDER — LORAZEPAM 2 MG/ML IJ SOLN
1.0000 mg | Freq: Four times a day (QID) | INTRAMUSCULAR | Status: DC | PRN
  Administered 2013-10-13 – 2013-10-15 (×6): 1 mg via INTRAVENOUS
  Filled 2013-10-12 (×5): qty 1

## 2013-10-12 MED ORDER — ONDANSETRON HCL 4 MG/2ML IJ SOLN
4.0000 mg | Freq: Once | INTRAMUSCULAR | Status: AC
  Administered 2013-10-12: 4 mg via INTRAVENOUS
  Filled 2013-10-12: qty 2

## 2013-10-12 MED ORDER — HYDROMORPHONE HCL PF 1 MG/ML IJ SOLN: 1.0000 mg | INTRAMUSCULAR | Status: DC | PRN

## 2013-10-12 MED ORDER — THIAMINE HCL 100 MG/ML IJ SOLN
100.0000 mg | Freq: Every day | INTRAMUSCULAR | Status: DC
  Administered 2013-10-12: 100 mg via INTRAVENOUS
  Filled 2013-10-12: qty 2

## 2013-10-12 MED ORDER — SODIUM CHLORIDE 0.9 % IV SOLN
INTRAVENOUS | Status: DC
  Administered 2013-10-12 – 2013-10-14 (×4): via INTRAVENOUS

## 2013-10-12 MED ORDER — HYDROMORPHONE HCL PF 1 MG/ML IJ SOLN
1.0000 mg | Freq: Once | INTRAMUSCULAR | Status: AC
  Administered 2013-10-12: 1 mg via INTRAVENOUS
  Filled 2013-10-12: qty 1

## 2013-10-12 MED ORDER — ONDANSETRON HCL 4 MG PO TABS: 4.0000 mg | ORAL_TABLET | Freq: Four times a day (QID) | ORAL | Status: DC | PRN

## 2013-10-12 MED ORDER — SODIUM CHLORIDE 0.9 % IV BOLUS (SEPSIS)
1000.0000 mL | Freq: Once | INTRAVENOUS | Status: AC
  Administered 2013-10-12: 1000 mL via INTRAVENOUS

## 2013-10-12 MED ORDER — LORAZEPAM 2 MG/ML IJ SOLN
0.0000 mg | Freq: Four times a day (QID) | INTRAMUSCULAR | Status: AC
  Administered 2013-10-12: 2 mg via INTRAVENOUS
  Administered 2013-10-13 (×2): 1 mg via INTRAVENOUS
  Administered 2013-10-13 – 2013-10-14 (×3): 2 mg via INTRAVENOUS
  Administered 2013-10-14: 1 mg via INTRAVENOUS
  Filled 2013-10-12 (×7): qty 1

## 2013-10-12 MED ORDER — FOLIC ACID 1 MG PO TABS
1.0000 mg | ORAL_TABLET | Freq: Every day | ORAL | Status: DC
  Administered 2013-10-15: 1 mg via ORAL
  Filled 2013-10-12 (×3): qty 1

## 2013-10-12 MED ORDER — ONDANSETRON HCL 4 MG/2ML IJ SOLN
4.0000 mg | Freq: Three times a day (TID) | INTRAMUSCULAR | Status: DC | PRN
  Administered 2013-10-12: 4 mg via INTRAVENOUS
  Filled 2013-10-12: qty 2

## 2013-10-12 MED ORDER — PANTOPRAZOLE SODIUM 40 MG IV SOLR
40.0000 mg | Freq: Once | INTRAVENOUS | Status: AC
  Administered 2013-10-12: 40 mg via INTRAVENOUS
  Filled 2013-10-12: qty 40

## 2013-10-12 MED ORDER — SODIUM CHLORIDE 0.9 % IJ SOLN
INTRAMUSCULAR | Status: AC
  Filled 2013-10-12: qty 250

## 2013-10-12 MED ORDER — IOHEXOL 300 MG/ML  SOLN
100.0000 mL | Freq: Once | INTRAMUSCULAR | Status: AC | PRN
  Administered 2013-10-12: 100 mL via INTRAVENOUS

## 2013-10-12 NOTE — ED Provider Notes (Signed)
CSN: [id number]     Arrival date & time 10/12/13  1155 History  This chart was scribed for Olivia Diego, MD by Roxan Diesel, ED scribe.  This patient was seen in room APA11/APA11 and the patient's care was started at 2:11 PM.   Chief Complaint  Patient presents with  . Abdominal Pain    Patient is a 38 y.o. female presenting with abdominal pain. The history is provided by the patient. No language interpreter was used.  Abdominal Pain Pain location:  Epigastric Pain radiates to:  Back Pain severity:  Severe Duration:  2 days Progression:  Worsening Chronicity:  Recurrent (similar but less severe episodes in the past) Associated symptoms: shortness of breath   Associated symptoms: no chest pain, no cough, no diarrhea, no fatigue and no hematuria   Risk factors: has not had multiple surgeries     HPI Comments: Olivia Lester is a 38 y.o. female with h/o gastric ulcer who presents to the Emergency Department complaining of severe epigastric abdominal pain that began 2 nights ago.  Pain radiates through into her back.  Pt also reports that if she lies down flat she becomes short of breath "like there's an elephant lying on my chest."  She admits to prior h/o similar pain in the past but states it was less severe and resolved on its own.  She denies h/o abdominal surgery or known gallbladder issues.  She had a gastric ulcer approximately 20 years ago which was diagnosed based on endoscopy.  She has had no other issues with ulcers since then.   Past Medical History  Diagnosis Date  . Gastric ulcer   . Back injury    Past Surgical History  Procedure Laterality Date  . Back surgery    . Tonsillectomy    . Breast surgery      No family history on file.   History  Substance Use Topics  . Smoking status: Current Every Day Smoker    Types: Cigarettes  . Smokeless tobacco: Not on file  . Alcohol Use: Yes    OB History   Grav Para Term Preterm Abortions TAB SAB Ect Mult Living                   Review of Systems  Constitutional: Negative for appetite change and fatigue.  HENT: Negative for congestion, ear discharge and sinus pressure.   Eyes: Negative for discharge.  Respiratory: Positive for shortness of breath. Negative for cough.   Cardiovascular: Negative for chest pain.  Gastrointestinal: Positive for abdominal pain. Negative for diarrhea.  Genitourinary: Negative for frequency and hematuria.  Musculoskeletal: Negative for back pain.  Skin: Negative for rash.  Neurological: Negative for seizures and headaches.  Psychiatric/Behavioral: Negative for hallucinations.      Allergies  Review of patient's allergies indicates no known allergies.  Home Medications   Current Outpatient Rx  Name  Route  Sig  Dispense  Refill  . Aspirin-Acetaminophen-Caffeine (GOODY HEADACHE PO)   Oral   Take 1-2 packets by mouth daily as needed (for pain).          Marland Kitchen diphenhydrAMINE (BENADRYL) 25 MG tablet   Oral   Take 25 mg by mouth at bedtime as needed for sleep.          Marland Kitchen ibuprofen (ADVIL,MOTRIN) 200 MG tablet   Oral   Take 800 mg by mouth daily as needed. For pain/fever          BP 175/107  Pulse 115  Temp(Src) 97.9 F (36.6 C) (Oral)  Resp 20  Ht 5\' 7"  (1.702 m)  Wt 125 lb (56.7 kg)  BMI 19.57 kg/m2  SpO2 99%  LMP 09/22/2013  Physical Exam  Nursing note and vitals reviewed. Constitutional: She is oriented to person, place, and time. She appears well-developed.  HENT:  Head: Normocephalic.  Eyes: Conjunctivae and EOM are normal. No scleral icterus.  Neck: Neck supple. No thyromegaly present.  Cardiovascular: Normal rate and regular rhythm.  Exam reveals no gallop and no friction rub.   No murmur heard. Pulmonary/Chest: No stridor. She has no wheezes. She has no rales. She exhibits no tenderness.  Abdominal: She exhibits no distension. There is tenderness (moderate) in the right upper quadrant. There is no rebound.  Musculoskeletal: Normal  range of motion. She exhibits no edema.  Lymphadenopathy:    She has no cervical adenopathy.  Neurological: She is oriented to person, place, and time. She exhibits normal muscle tone. Coordination normal.  Skin: No rash noted. No erythema.  Psychiatric: She has a normal mood and affect. Her behavior is normal.    ED Course  Procedures (including critical care time)  DIAGNOSTIC STUDIES: Oxygen Saturation is 99% on room air, normal by my interpretation.    COORDINATION OF CARE: 2:14 PM-Discussed treatment plan which includes labs and imaging with pt at bedside and pt agreed to plan.     Labs Review Labs Reviewed  CBC WITH DIFFERENTIAL - Abnormal; Notable for the following:    Hemoglobin 15.5 (*)    Platelets 126 (*)    Neutrophils Relative % 84 (*)    Neutro Abs 8.5 (*)    All other components within normal limits  COMPREHENSIVE METABOLIC PANEL - Abnormal; Notable for the following:    Potassium 3.6 (*)    Glucose, Bld 124 (*)    AST 144 (*)    ALT 101 (*)    All other components within normal limits  LIPASE, BLOOD - Abnormal; Notable for the following:    Lipase 74 (*)    All other components within normal limits  URINALYSIS, ROUTINE W REFLEX MICROSCOPIC - Abnormal; Notable for the following:    APPearance CLOUDY (*)    Bilirubin Urine SMALL (*)    Ketones, ur TRACE (*)    All other components within normal limits  TROPONIN I   Imaging Review No results found.   EKG Interpretation None      MDM   Final diagnoses:  None    Duodenitis        The chart was scribed for me under my direct supervision.  I personally performed the history, physical, and medical decision making and all procedures in the evaluation of this patient.Olivia Diego, MD 10/12/13 [phone]

## 2013-10-12 NOTE — ED Notes (Signed)
Dr Zammit at bedside. 

## 2013-10-12 NOTE — ED Notes (Signed)
Pt presents to er for further evaluation of epigastric pain that radiates to mid chest area and radiates around right side of abd area. Pain became worse after eating a cheeseburger, pt states that her epigastic area has been tender in the past but this is worse,

## 2013-10-12 NOTE — H&P (Signed)
[facility] History and Physical  Olivia Lester ASN:[id number] DOB: 1975-12-30 DOA: 10/12/2013  Referring physician: ER. PCP: Neale Burly, MD   Chief Complaint: Epigastric pain.  HPI: Olivia Lester is a 38 y.o. female  This is a 38 year old lady who had onset of epigastric pain this morning associated with nausea and vomiting. When she was questioned directly, she admits to hematemesis but cannot quantify. She denies any rectal bleeding. She has a previously documented history of excess alcohol intake. After further questioning, she admitted that she did drink more alcohol than she thinks she should and she is in counseling together with her boyfriend regarding other domestic issues and alcoholism seems to be one of them. She denies any fever.   Review of Systems:  Constitutional:  No weight loss, night sweats, Fevers, chills, fatigue.  HEENT:  No headaches, Difficulty swallowing,Tooth/dental problems,Sore throat,  No sneezing, itching, ear ache, nasal congestion, post nasal drip,  Cardio-vascular:  No chest pain, Orthopnea, PND, swelling in lower extremities, anasarca, dizziness, palpitations  GI:  No heartburn, , diarrhea, change in bowel habits, loss of appetite  Resp:  No shortness of breath with exertion or at rest. No excess mucus, no productive cough, No non-productive cough, No coughing up of blood.No change in color of mucus.No wheezing.No chest wall deformity  Skin:  no rash or lesions.  GU:  no dysuria, change in color of urine, no urgency or frequency. No flank pain.  Musculoskeletal:  No joint pain or swelling. No decreased range of motion. No back pain.  Psych:  No change in mood or affect. No depression or anxiety. No memory loss.   Past Medical History  Diagnosis Date  . Gastric ulcer   . Back injury    Past Surgical History  Procedure Laterality Date  . Back surgery    . Tonsillectomy    . Breast surgery     Social History:  reports that she has been  smoking Cigarettes.  She has a 20 pack-year smoking history. She does not have any smokeless tobacco history on file. She reports that she drinks about 7.2 ounces of alcohol per week. She reports that she does not use illicit drugs.  No Known Allergies  History reviewed. No pertinent family history.   Prior to Admission medications   Medication Sig Start Date End Date Taking? Authorizing Provider  Aspirin-Acetaminophen-Caffeine (GOODY HEADACHE PO) Take 1-2 packets by mouth daily as needed (for pain).    Yes Historical Provider, MD  diphenhydrAMINE (BENADRYL) 25 MG tablet Take 25 mg by mouth at bedtime as needed for sleep.    Yes Historical Provider, MD  ibuprofen (ADVIL,MOTRIN) 200 MG tablet Take 800 mg by mouth daily as needed. For pain/fever   Yes Historical Provider, MD   Physical Exam: Filed Vitals:   10/12/13 1948  BP: 159/98  Pulse: 77  Temp: 98.2 F (36.8 C)  Resp: 20    BP 159/98  Pulse 77  Temp(Src) 98.2 F (36.8 C) (Oral)  Resp 20  Ht 5\' 7"  (1.702 m)  Wt 56.7 kg (125 lb)  BMI 19.57 kg/m2  SpO2 100%  LMP 09/22/2013  General:  Appears jittery and slightly anxious. She is alert and orientated, however. Eyes: PERRL, normal lids, irises & conjunctiva ENT: grossly normal hearing, lips & tongue Neck: no LAD, masses or thyromegaly Cardiovascular: RRR, no m/r/g. No LE edema. Telemetry: SR, no arrhythmias  Respiratory: CTA bilaterally, no w/r/r. Normal respiratory effort. Abdomen: soft, tender in the epigastric and right  upper quadrant area. Skin: no rash or induration seen on limited exam Musculoskeletal: grossly normal tone BUE/BLE Psychiatric: Anxious. Slightly tremulous. Neurologic: grossly non-focal.          Labs on Admission:  Basic Metabolic Panel:  Recent Labs Lab 10/12/13 1221  NA 139  K 3.6*  CL 99  CO2 22  GLUCOSE 124*  BUN 8  CREATININE 0.57  CALCIUM 9.9   Liver Function Tests:  Recent Labs Lab 10/12/13 1221  AST 144*  ALT 101*    ALKPHOS 72  BILITOT 0.4  PROT 8.0  ALBUMIN 4.3    Recent Labs Lab 10/12/13 1221  LIPASE 74*    CBC:  Recent Labs Lab 10/12/13 1221  WBC 10.1  NEUTROABS 8.5*  HGB 15.5*  HCT 43.8  MCV 95.2  PLT 126*   Cardiac Enzymes:  Recent Labs Lab 10/12/13 1221  TROPONINI <0.30      Radiological Exams on Admission: Dg Chest 2 View  10/12/2013   CLINICAL DATA:  Abdominal pain, epigastric pain, shortness of breath  EXAM: CHEST  2 VIEW  COMPARISON:  10/05/2012  FINDINGS: Normal heart size, mediastinal contours, and pulmonary vascularity.  Minimal chronic bronchitic changes without infiltrate, pleural effusion or pneumothorax.  Bones unremarkable.  IMPRESSION: Minimal chronic bronchitic changes.  No acute abnormalities.   Electronically Signed   By: Lavonia Dana M.D.   On: 10/12/2013 16:00   Ct Abdomen Pelvis W Contrast  10/12/2013   CLINICAL DATA:  Severe epigastric abdominal pain for 2 days, pain radiating through to back, history of gastric ulcer  EXAM: CT ABDOMEN AND PELVIS WITH CONTRAST  TECHNIQUE: Multidetector CT imaging of the abdomen and pelvis was performed using the standard protocol following bolus administration of intravenous contrast. Sagittal and coronal MPR images reconstructed from axial data set.  CONTRAST:  143mL OMNIPAQUE IOHEXOL 300 MG/ML SOLN IV, 30mL OMNIPAQUE IOHEXOL 300 MG/ML SOLN ORALLY  COMPARISON:  None  FINDINGS: Lung bases clear.  Diffuse fatty infiltration of liver.  More pronounced focal fatty infiltration adjacent to falciform fissure.  Liver, spleen, pancreas, kidneys, and adrenal glands normal appearance.  Question mild wall thickening of the duodenal bulb.  No periduodenal infiltrative changes, fluid or gas identified.  Stomach and bowel loops otherwise normal appearance.  Scattered normal sized lymph nodes in upper abdomen.  Normal appendix.  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  Uterus, adnexae,  bladder, and ureters otherwise normal appearance.  No mass, adenopathy, free fluid or inflammatory process.  Beam hardening artifacts secondary to orthopedic hardware within lumbar spine at L4-L5.  No acute osseous findings.  IMPRESSION: Mild duodenal bulb wall thickening could reflect duodenitis or ulcer disease.  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  Fatty infiltration of liver.  No other intra-abdominal or intrapelvic process identified.   Electronically Signed   By: Lavonia Dana M.D.   On: 10/12/2013 15:57      Assessment/Plan   1. Epigastric pain, unclear etiology. CT scan is suggestive of small intestine final pathology. The patient has a history of gastric ulcer approximately 20 years ago. The patient, when asked, describes hematemesis but does not appear to have had any since being in the hospital. 2. Mild elevation in lipase although the pancreas on CT scan is normal. 3. Elevated liver enzymes, likely related to alcoholism. Alkaline phosphatase is in normal range, making biliary pathology unlikely. 4. Fatty liver, likely related to alcoholism and in part  accounting for elevated liver enzymes. 5. 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 6. Alcoholism.  Plan: 1. Admit to medical floor. 2. Intravenous Protonix. 3. Antiemetics and analgesia as required. 4. Gastroenterology consultation. She may need EGD. 5. Watch for alcohol withdrawal and initiate withdrawal protocol if necessary.  Further recommendations will depend on patient's hospital progress.  Code Status: Full code.  Family Communication: Discuss plan with patient at the bedside.  Disposition Plan: Home in medically stable.  Time spent: 45 minutes.  Doree Albee [facility] [phone].

## 2013-10-12 NOTE — ED Notes (Signed)
Report called to Marcie Bal, RN on med/surg

## 2013-10-13 DIAGNOSIS — K7010 Alcoholic hepatitis without ascites: Secondary | ICD-10-CM

## 2013-10-13 DIAGNOSIS — R112 Nausea with vomiting, unspecified: Secondary | ICD-10-CM

## 2013-10-13 DIAGNOSIS — R1013 Epigastric pain: Secondary | ICD-10-CM

## 2013-10-13 DIAGNOSIS — D696 Thrombocytopenia, unspecified: Secondary | ICD-10-CM

## 2013-10-13 DIAGNOSIS — F1020 Alcohol dependence, uncomplicated: Secondary | ICD-10-CM

## 2013-10-13 LAB — COMPREHENSIVE METABOLIC PANEL
ALK PHOS: 55 U/L (ref 39–117)
ALT: 69 U/L — AB (ref 0–35)
AST: 86 U/L — ABNORMAL HIGH (ref 0–37)
Albumin: 3.3 g/dL — ABNORMAL LOW (ref 3.5–5.2)
BUN: 5 mg/dL — ABNORMAL LOW (ref 6–23)
CO2: 26 mEq/L (ref 19–32)
Calcium: 7.8 mg/dL — ABNORMAL LOW (ref 8.4–10.5)
Chloride: 101 mEq/L (ref 96–112)
Creatinine, Ser: 0.54 mg/dL (ref 0.50–1.10)
GFR calc non Af Amer: >90 mL/min (ref >90)
GLUCOSE: 69 mg/dL — AB (ref 70–99)
POTASSIUM: 3.4 meq/L — AB (ref 3.7–5.3)
SODIUM: 140 meq/L (ref 137–147)
Total Bilirubin: 0.4 mg/dL (ref 0.3–1.2)
Total Protein: 6.3 g/dL (ref 6.0–8.3)

## 2013-10-13 LAB — LIPASE, BLOOD: Lipase: 47 U/L (ref 11–59)

## 2013-10-13 LAB — CBC
HEMATOCRIT: 37.6 % (ref 36.0–46.0)
Hemoglobin: 12.9 g/dL (ref 12.0–15.0)
MCH: 33.4 pg (ref 26.0–34.0)
MCHC: 34.3 g/dL (ref 30.0–36.0)
MCV: 97.4 fL (ref 78.0–100.0)
PLATELETS: 86 10*3/uL — AB (ref 150–400)
RBC: 3.86 MIL/uL — AB (ref 3.87–5.11)
RDW: 12.9 % (ref 11.5–15.5)
WBC: 5.7 10*3/uL (ref 4.0–10.5)

## 2013-10-13 LAB — PROTIME-INR
INR: 0.96 (ref 0.00–1.49)
Prothrombin Time: 12.6 seconds (ref 11.6–15.2)

## 2013-10-13 MED ORDER — ONDANSETRON HCL 4 MG/2ML IJ SOLN
4.0000 mg | Freq: Four times a day (QID) | INTRAMUSCULAR | Status: DC | PRN
  Administered 2013-10-13 – 2013-10-15 (×3): 4 mg via INTRAMUSCULAR
  Filled 2013-10-13 (×3): qty 2

## 2013-10-13 MED ORDER — MORPHINE SULFATE 2 MG/ML IJ SOLN
2.0000 mg | INTRAMUSCULAR | Status: DC | PRN
  Administered 2013-10-13 – 2013-10-15 (×12): 2 mg via INTRAVENOUS
  Filled 2013-10-13 (×12): qty 1

## 2013-10-13 MED ORDER — POTASSIUM CHLORIDE CRYS ER 20 MEQ PO TBCR
40.0000 meq | EXTENDED_RELEASE_TABLET | Freq: Two times a day (BID) | ORAL | Status: AC
  Administered 2013-10-13 (×2): 40 meq via ORAL
  Filled 2013-10-13 (×2): qty 2

## 2013-10-13 MED ORDER — NICOTINE 21 MG/24HR TD PT24
21.0000 mg | MEDICATED_PATCH | Freq: Every day | TRANSDERMAL | Status: DC
  Administered 2013-10-13 – 2013-10-15 (×3): 21 mg via TRANSDERMAL
  Filled 2013-10-13 (×3): qty 1

## 2013-10-13 NOTE — Progress Notes (Signed)
Utilization review Completed Zakhai Meisinger RN BSN   

## 2013-10-13 NOTE — Progress Notes (Signed)
PROGRESS NOTE  TEARAH SAULSBURY GHW:[id number] DOB: [birth year] DOA: 10/12/2013 PCP: Neale Burly, MD  Summary: 38 year old woman with history of alcohol abuse, query dependence, presented with nausea and vomiting, epigastric abdominal pain and history of hematemesis. Admitted to excessive alcohol intake. Admitted for further evaluation of epigastric abdominal pain, mild elevation of lipase, elevated LFTs, GI consultation.  Assessment/Plan: 1. Epigastric abdominal pain. Exam benign, lipase has returned to normal, pancreas appeared normal CT. Suspect alcoholic gastritis, possible peptic ulcer disease based on CT findings, significant NSAID use as well as history of remote peptic ulcer disease. No significant hematemesis. 2. Elevated transaminases. Trending downwards. Likely secondary to alcoholic hepatitis superimposed on fatty liver. 3. Elevated lipase, now normal. Doubt pancreatitis. 4. Thrombocytopenia. Likely myelosuppression secondary to alcohol abuse. Monitor clinically. 5. Hypokalemia. 6. Alcohol abuse, query dependence. 7. H/o PUD, query ongoing NSAID use 8. Small dermoid tumor right ovary. Followup as an outpatient. 9. Tobacco dependence, marijuana use.   Clear liquids if ok with GI. GI consultation. Repeat chemistry panel and CBC in the morning. Continue PPI.  Replete potassium.  Monitor for alcohol withdrawal. CIWA.  Recommend stop smoking.  CSW consult for polysubstance abuse  Code Status: full code DVT prophylaxis: SCDs (thrombocytopenia)  Family Communication:  Disposition Plan: home when improved  Murray Hodgkins, MD  [facility]  Pager [phone] If 7PM-7AM, please contact night-coverage at [email or web address], password [facility] 10/13/2013, 8:54 AM  LOS: 1 day   Consultants:  Gastroenterology  Procedures:    Antibiotics:    HPI/Subjective: Feels about the same today some nausea but no vomiting. He still has moderate epigastric and right upper quadrant  abdominal pain. Last ring of alcohol 3/18. Intermittent hematemesis over the last several weeks. Minimal amount. Significant use of NSAIDs and Goody powders.  Objective: Filed Vitals:   10/12/13 1858 10/12/13 1948 10/13/13 0123 10/13/13 0537  BP: 160/96 159/98 132/95 141/87  Pulse: 81 77 79 83  Temp: 98 F (36.7 C) 98.2 F (36.8 C)  98.3 F (36.8 C)  TempSrc:  Oral  Oral  Resp: 20 20  20   Height: 5\' 7"  (1.702 m)     Weight: 56.7 kg (125 lb)     SpO2: 100% 100%  100%   No intake or output data in the 24 hours ending 10/13/13 0854   Filed Weights   10/12/13 1207 10/12/13 1858  Weight: 56.7 kg (125 lb) 56.7 kg (125 lb)    Exam:   Afebrile, vital signs are stable. No hypoxia.  Gen. Appears calm and comfortable. Speech fluent and clear.  Cardiovascular regular rate and rhythm. No murmur, rub or gallop. No lower extremity edema.  Respiratory clear to auscultation bilaterally. No wheezes, rales or rhonchi. Normal respiratory effort.  Abdomen positive bowel sounds. Unremarkable on gross inspection. Moderate epigastric abdominal pain with palpation, some right upper quadrant pain. No rebound or guarding.  Psychiatric grossly normal mood and affect.  Data Reviewed:  Potassium 3.4. AST, ALT trending downwards. Remainder of CMP unremarkable.  Platelet count 86, hemoglobin 12.9, suspect decrease in hemoglobin from hemodilution.  CT of the abdomen and pelvis revealed mild duodenal bulb thickening, consider ulcer. Small dermoid tumor right ovary. Fatty infiltration of the liver.  Scheduled Meds: . folic acid  1 mg Oral Daily  . LORazepam  0-4 mg Intravenous Q6H   Followed by  . [START ON 10/14/2013] LORazepam  0-4 mg Intravenous Q12H  . multivitamin with minerals  1 tablet Oral Daily  . pantoprazole (PROTONIX) IV  40 mg Intravenous  Q12H  . thiamine  100 mg Oral Daily   Or  . thiamine  100 mg Intravenous Daily   Continuous Infusions: . sodium chloride 125 mL/hr at 10/13/13  0302    Principal Problem:   Abdominal pain, epigastric Active Problems:   Duodenitis   Alcoholism   Thrombocytopenia   Time spent 25 minutes

## 2013-10-13 NOTE — Consult Note (Signed)
Referring Provider: No ref. provider found Primary Care Physician:  Patient does not have PCP. Primary Gastroenterologist:  Dr. Laural Golden  Reason for Consultation:    Epigastric pain nausea and vomiting. Duodenal wall thickening on CT and elevated transaminases.  HPI:   Patient is 38 year old Caucasian female who was admitted to Dr. Everett Graff service yesterday via emergency room where she presented with abdominal pain nausea vomiting and history of hematemesis. Patient states she has been having intermittent epigastric pain for one year. She also said intermittent nausea and vomiting. She does not have a good appetite and she has lost 10 pounds this year. On few occasions she has vomited bright red blood last episode was 10 days ago. She does not recall having melena or rectal bleeding. She does not have good appetite. She has chronic low back pain and takes Motrin and/or Goody powder daily. She usually alternates them. She has taken as many as 12 tablets of Motrin on a given day and she has taken as many as 6 doses of Goody powder on a given day. She states she was diagnosed with peptic ulcer disease about 20 years ago. She had an EGD. H. pylori status is unknown. Patient denies history of anicteric hepatitis or jaundice. She says she had markers for hepatitis B and C2 years ago when she was in jail and these were negative. She drinks alcohol frequently if not daily. She drinks beer and or vodka.  On some days she drinks as many as 12 cans of beer.  She's been smoking cigarettes for 20 years and presents smoking one pack per day. She is divorced and has 3 children ages 21 and 58 and 82. She worked at [employer] for 4 years but presently unemployed. Her boyfriend Dominica Severin is in the room with her.  Past medical history;  Anxiety. History of peptic ulcer disease. History of alcohol abuse. Alcohol level on 11/03/2010 was 292 And was 289 on visit to the emergency room on 09/25/2013. Tonsillectomy in  1993. Spinal fusion being L4-L5 and S1 in 2002. She is left with chronic low back pain. She had benign lump removed from her left breast in 2005.  Prior to Admission medications   Medication Sig Start Date End Date Taking? Authorizing Provider  Aspirin-Acetaminophen-Caffeine (GOODY HEADACHE PO) Take 1-2 packets by mouth daily as needed (for pain).    Yes Historical Provider, MD  diphenhydrAMINE (BENADRYL) 25 MG tablet Take 25 mg by mouth at bedtime as needed for sleep.    Yes Historical Provider, MD  ibuprofen (ADVIL,MOTRIN) 200 MG tablet Take 800 mg by mouth daily as needed. For pain/fever   Yes Historical Provider, MD    Current Facility-Administered Medications  Medication Dose Route Frequency Provider Last Rate Last Dose  . 0.9 %  sodium chloride infusion   Intravenous Continuous Samuella Cota, MD 125 mL/hr at 10/13/13 0302    . folic acid (FOLVITE) tablet 1 mg  1 mg Oral Daily Rexene Alberts, MD      . LORazepam (ATIVAN) injection 0-4 mg  0-4 mg Intravenous Q6H Rexene Alberts, MD   2 mg at 10/13/13 0841   Followed by  . [START ON 10/14/2013] LORazepam (ATIVAN) injection 0-4 mg  0-4 mg Intravenous Q12H Rexene Alberts, MD      . LORazepam (ATIVAN) tablet 1 mg  1 mg Oral Q6H PRN Rexene Alberts, MD       Or  . LORazepam (ATIVAN) injection 1 mg  1 mg Intravenous Q6H PRN Rexene Alberts, MD  1 mg at 10/13/13 0630  . morphine 2 MG/ML injection 2 mg  2 mg Intravenous Q4H PRN Samuella Cota, MD   2 mg at 10/13/13 1002  . multivitamin with minerals tablet 1 tablet  1 tablet Oral Daily Rexene Alberts, MD      . nicotine (NICODERM CQ - dosed in mg/24 hours) patch 21 mg  21 mg Transdermal Daily Samuella Cota, MD   21 mg at 10/13/13 0953  . pantoprazole (PROTONIX) injection 40 mg  40 mg Intravenous Q12H Nimish C Anastasio Champion, MD   40 mg at 10/13/13 0844  . potassium chloride SA (K-DUR,KLOR-CON) CR tablet 40 mEq  40 mEq Oral BID Samuella Cota, MD   40 mEq at 10/13/13 1001  . thiamine (VITAMIN B-1)  tablet 100 mg  100 mg Oral Daily Rexene Alberts, MD       Or  . thiamine (B-1) injection 100 mg  100 mg Intravenous Daily Rexene Alberts, MD   100 mg at 10/12/13 2207    Allergies as of 10/12/2013  . (No Known Allergies)    History reviewed. No pertinent family history.  History   Social History  . Marital Status: Divorced    Spouse Name: N/A    Number of Children: N/A  . Years of Education: N/A   Occupational History  . Not on file.   Social History Main Topics  . Smoking status: Current Every Day Smoker -- 1.00 packs/day for 20 years    Types: Cigarettes  . Smokeless tobacco: Not on file  . Alcohol Use: 7.2 oz/week    12 Cans of beer per week  . Drug Use: No  . Sexual Activity: Yes    Birth Control/ Protection: None   Other Topics Concern  . Not on file   Social History Narrative  . No narrative on file    Review of Systems: See HPI, otherwise normal ROS  Physical Exam: Temp:  [98 F (36.7 C)-98.3 F (36.8 C)] 98.2 F (36.8 C) (03/22 1348) Pulse Rate:  [77-107] 107 (03/22 1348) Resp:  [18-20] 20 (03/22 1348) BP: (132-169)/(87-117) 146/98 mmHg (03/22 1348) SpO2:  [98 %-100 %] 100 % (03/22 1348) Weight:  [125 lb (56.7 kg)] 125 lb (56.7 kg) (03/21 1858) Last BM Date: 10/10/13 Well-developed thin Caucasian female who is in no acute distress but she appears very anxious. She is alert responds appropriately to questions. She has small scab over right for head(states she fell few days ago) Conjunctiva is pink and sclera nonicteric. Oropharyngeal mucosa is normal. Dentition in poor condition. No neck masses or thyromegaly noted. Cardiac exam with regular rhythm normal S1 and S2. No murmur or gallop noted. Lungs are clear to auscultation. Abdomen is flat with small tattoo at LLQ. Bowel sounds are normal. Abdomen is soft with mild to moderate midepigastric tenderness. Liver edge is also tender below right costal margin. Spleen is nonpalpable. No clubbing or  peripheral edema noted.   Lab Results:  Recent Labs  10/12/13 1221 10/13/13 0456  WBC 10.1 5.7  HGB 15.5* 12.9  HCT 43.8 37.6  PLT 126* 86*   BMET  Recent Labs  10/12/13 1221 10/13/13 0456  NA 139 140  K 3.6* 3.4*  CL 99 101  CO2 22 26  GLUCOSE 124* 69*  BUN 8 5*  CREATININE 0.57 0.54  CALCIUM 9.9 7.8*   LFT  Recent Labs  10/13/13 0456  PROT 6.3  ALBUMIN 3.3*  AST 86*  ALT 69*  ALKPHOS  55  BILITOT 0.4   PT/INR  Recent Labs  10/13/13 0456  LABPROT 12.6  INR 0.96    Studies/Results: Dg Chest 2 View  10/12/2013   CLINICAL DATA:  Abdominal pain, epigastric pain, shortness of breath  EXAM: CHEST  2 VIEW  COMPARISON:  10/05/2012  FINDINGS: Normal heart size, mediastinal contours, and pulmonary vascularity.  Minimal chronic bronchitic changes without infiltrate, pleural effusion or pneumothorax.  Bones unremarkable.  IMPRESSION: Minimal chronic bronchitic changes.  No acute abnormalities.   Electronically Signed   By: Lavonia Dana M.D.   On: 10/12/2013 16:00   Ct Abdomen Pelvis W Contrast  10/12/2013   CLINICAL DATA:  Severe epigastric abdominal pain for 2 days, pain radiating through to back, history of gastric ulcer  EXAM: CT ABDOMEN AND PELVIS WITH CONTRAST  TECHNIQUE: Multidetector CT imaging of the abdomen and pelvis was performed using the standard protocol following bolus administration of intravenous contrast. Sagittal and coronal MPR images reconstructed from axial data set.  CONTRAST:  157mL OMNIPAQUE IOHEXOL 300 MG/ML SOLN IV, 4mL OMNIPAQUE IOHEXOL 300 MG/ML SOLN ORALLY  COMPARISON:  None  FINDINGS: Lung bases clear.  Diffuse fatty infiltration of liver.  More pronounced focal fatty infiltration adjacent to falciform fissure.  Liver, spleen, pancreas, kidneys, and adrenal glands normal appearance.  Question mild wall thickening of the duodenal bulb.  No periduodenal infiltrative changes, fluid or gas identified.  Stomach and bowel loops otherwise normal  appearance.  Scattered normal sized lymph nodes in upper abdomen.  Normal appendix.  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  Uterus, adnexae, bladder, and ureters otherwise normal appearance.  No mass, adenopathy, free fluid or inflammatory process.  Beam hardening artifacts secondary to orthopedic hardware within lumbar spine at L4-L5.  No acute osseous findings.  IMPRESSION: Mild duodenal bulb wall thickening could reflect duodenitis or ulcer disease.  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  Fatty infiltration of liver.  No other intra-abdominal or intrapelvic process identified.   Electronically Signed   By: Lavonia Dana M.D.   On: 10/12/2013 15:57    Assessment;  #1.Patient is  38 year old Caucasian female who presents with one-year history of intermittent epigastric pain worse lately associated with nausea and vomiting and she also gives history of intermittent hematemesis most recently 10 days ago. Patient takes Motrin and or Goody powder on a daily basis. CT suggests duodenal wall thickening. Suspect she has peptic ulcer disease. Mildly elevated lipase but appeared to be nonspecific. No CT evidence of pancreatitis. Patient will need a diagnostic EGD. Given ongoing alcohol abuse and anxiety she would be hard to sedate and therefore would recommend EGD with monitored anesthesia care propofol. #2.Elevated transaminases with AST greater than ALT. CT reveals fatty liver and no evidence of dilated ducts. Suspect we are dealing with alcoholic hepatitis. Will screen for hepatitis B and C. #3. Thrombocytopenia. This may be due to alcohol induced marrow toxicity. There is no stigmata of chronic liver disease or portal hypertension. #4. Dermoid cyst right ovary. Patient made aware of this diagnosis. She will need to followup with her gynecologist.  Recommendations;  Consider referral for alcohol rehabilitation. Diagnostic  esophagogastroduodenoscopy with propofol. HbsAg and HCV antibody. Ondansetron 4 mg IV every 6 when necessary.   LOS: 1 day   Joh Rao U  10/13/2013, 2:14 PM

## 2013-10-14 ENCOUNTER — Inpatient Hospital Stay (HOSPITAL_COMMUNITY): Payer: Self-pay | Admitting: Anesthesiology

## 2013-10-14 ENCOUNTER — Encounter (HOSPITAL_COMMUNITY): Payer: Self-pay | Admitting: *Deleted

## 2013-10-14 ENCOUNTER — Encounter (HOSPITAL_COMMUNITY)
Admission: EM | Disposition: A | Discharge status: Home / Self Care | Payer: Self-pay | Source: Home / Self Care | Attending: Family Medicine

## 2013-10-14 ENCOUNTER — Encounter (HOSPITAL_COMMUNITY): Payer: MEDICAID | Admitting: Anesthesiology

## 2013-10-14 DIAGNOSIS — F17200 Nicotine dependence, unspecified, uncomplicated: Secondary | ICD-10-CM

## 2013-10-14 DIAGNOSIS — K259 Gastric ulcer, unspecified as acute or chronic, without hemorrhage or perforation: Secondary | ICD-10-CM

## 2013-10-14 DIAGNOSIS — R7401 Elevation of levels of liver transaminase levels: Secondary | ICD-10-CM

## 2013-10-14 DIAGNOSIS — D696 Thrombocytopenia, unspecified: Secondary | ICD-10-CM

## 2013-10-14 DIAGNOSIS — R740 Nonspecific elevation of levels of transaminase and lactic acid dehydrogenase [LDH]: Secondary | ICD-10-CM

## 2013-10-14 DIAGNOSIS — B1920 Unspecified viral hepatitis C without hepatic coma: Secondary | ICD-10-CM

## 2013-10-14 HISTORY — DX: Gastric ulcer, unspecified as acute or chronic, without hemorrhage or perforation: K25.9

## 2013-10-14 HISTORY — PX: ESOPHAGOGASTRODUODENOSCOPY (EGD) WITH PROPOFOL: SHX5813

## 2013-10-14 HISTORY — DX: Unspecified viral hepatitis C without hepatic coma: B19.20

## 2013-10-14 LAB — COMPREHENSIVE METABOLIC PANEL
ALT: 94 U/L — ABNORMAL HIGH (ref 0–35)
AST: 142 U/L — AB (ref 0–37)
Albumin: 3.5 g/dL (ref 3.5–5.2)
Alkaline Phosphatase: 56 U/L (ref 39–117)
BILIRUBIN TOTAL: 0.5 mg/dL (ref 0.3–1.2)
BUN: 3 mg/dL — AB (ref 6–23)
CALCIUM: 8.8 mg/dL (ref 8.4–10.5)
CHLORIDE: 103 meq/L (ref 96–112)
CO2: 28 mEq/L (ref 19–32)
CREATININE: 0.52 mg/dL (ref 0.50–1.10)
GFR calc Af Amer: >90 mL/min (ref >90)
GFR calc non Af Amer: >90 mL/min (ref >90)
Glucose, Bld: 79 mg/dL (ref 70–99)
Potassium: 4.4 mEq/L (ref 3.7–5.3)
Sodium: 138 mEq/L (ref 137–147)
Total Protein: 6.5 g/dL (ref 6.0–8.3)

## 2013-10-14 LAB — HEPATITIS B SURFACE ANTIGEN: HEP B S AG: NEGATIVE

## 2013-10-14 LAB — CBC
HCT: 41.5 % (ref 36.0–46.0)
HEMOGLOBIN: 14.2 g/dL (ref 12.0–15.0)
MCH: 33.6 pg (ref 26.0–34.0)
MCHC: 34.2 g/dL (ref 30.0–36.0)
MCV: 98.3 fL (ref 78.0–100.0)
PLATELETS: 89 10*3/uL — AB (ref 150–400)
RBC: 4.22 MIL/uL (ref 3.87–5.11)
RDW: 12.9 % (ref 11.5–15.5)
WBC: 4.5 10*3/uL (ref 4.0–10.5)

## 2013-10-14 LAB — HEPATITIS C ANTIBODY: HCV Ab: REACTIVE — AB

## 2013-10-14 LAB — MAGNESIUM: MAGNESIUM: 1.7 mg/dL (ref 1.5–2.5)

## 2013-10-14 SURGERY — ESOPHAGOGASTRODUODENOSCOPY (EGD) WITH PROPOFOL
Anesthesia: Monitor Anesthesia Care | Site: Esophagus

## 2013-10-14 MED ORDER — LIDOCAINE HCL (CARDIAC) 10 MG/ML IV SOLN
INTRAVENOUS | Status: DC | PRN
  Administered 2013-10-14: 30 mg via INTRAVENOUS

## 2013-10-14 MED ORDER — SODIUM CHLORIDE 0.9 % IV SOLN
INTRAVENOUS | Status: DC
Start: 2013-10-14 — End: 2013-10-14
  Administered 2013-10-14: 06:00:00 via INTRAVENOUS

## 2013-10-14 MED ORDER — GLYCOPYRROLATE 0.2 MG/ML IJ SOLN
INTRAMUSCULAR | Status: AC
  Filled 2013-10-14: qty 1

## 2013-10-14 MED ORDER — FENTANYL CITRATE 0.05 MG/ML IJ SOLN
INTRAMUSCULAR | Status: AC
  Filled 2013-10-14: qty 2

## 2013-10-14 MED ORDER — PROPOFOL INFUSION 10 MG/ML OPTIME
INTRAVENOUS | Status: DC | PRN
  Administered 2013-10-14: 200 ug/kg/min via INTRAVENOUS

## 2013-10-14 MED ORDER — FENTANYL CITRATE 0.05 MG/ML IJ SOLN
25.0000 ug | INTRAMUSCULAR | Status: AC
  Administered 2013-10-14 (×2): 25 ug via INTRAVENOUS

## 2013-10-14 MED ORDER — BUTAMBEN-TETRACAINE-BENZOCAINE 2-2-14 % EX AERO
1.0000 | INHALATION_SPRAY | CUTANEOUS | Status: AC
  Administered 2013-10-14 (×2): 1 via TOPICAL
  Filled 2013-10-14: qty 56

## 2013-10-14 MED ORDER — MIDAZOLAM HCL 2 MG/2ML IJ SOLN
INTRAMUSCULAR | Status: AC
  Filled 2013-10-14: qty 2

## 2013-10-14 MED ORDER — LIDOCAINE HCL (PF) 1 % IJ SOLN
INTRAMUSCULAR | Status: AC
  Filled 2013-10-14: qty 5

## 2013-10-14 MED ORDER — ONDANSETRON HCL 4 MG/2ML IJ SOLN
INTRAMUSCULAR | Status: AC
  Filled 2013-10-14: qty 2

## 2013-10-14 MED ORDER — LACTATED RINGERS IV SOLN
INTRAVENOUS | Status: DC
  Administered 2013-10-14: 10:00:00 via INTRAVENOUS

## 2013-10-14 MED ORDER — FENTANYL CITRATE 0.05 MG/ML IJ SOLN: 25.0000 ug | INTRAMUSCULAR | Status: DC | PRN

## 2013-10-14 MED ORDER — ONDANSETRON HCL 4 MG/2ML IJ SOLN: 4.0000 mg | Freq: Once | INTRAMUSCULAR | Status: DC | PRN

## 2013-10-14 MED ORDER — GLYCOPYRROLATE 0.2 MG/ML IJ SOLN
0.2000 mg | Freq: Once | INTRAMUSCULAR | Status: AC
  Administered 2013-10-14: 0.2 mg via INTRAVENOUS

## 2013-10-14 MED ORDER — ONDANSETRON HCL 4 MG/2ML IJ SOLN
4.0000 mg | Freq: Once | INTRAMUSCULAR | Status: AC
  Administered 2013-10-14: 4 mg via INTRAVENOUS

## 2013-10-14 MED ORDER — PROPOFOL 10 MG/ML IV EMUL
INTRAVENOUS | Status: AC
  Filled 2013-10-14: qty 20

## 2013-10-14 MED ORDER — FENTANYL CITRATE 0.05 MG/ML IJ SOLN
INTRAMUSCULAR | Status: DC | PRN
  Administered 2013-10-14 (×2): 50 ug via INTRAVENOUS

## 2013-10-14 MED ORDER — MIDAZOLAM HCL 2 MG/2ML IJ SOLN
1.0000 mg | INTRAMUSCULAR | Status: DC | PRN
  Administered 2013-10-14 (×2): 2 mg via INTRAVENOUS
  Filled 2013-10-14: qty 2

## 2013-10-14 MED ORDER — PANTOPRAZOLE SODIUM 40 MG PO TBEC
40.0000 mg | DELAYED_RELEASE_TABLET | Freq: Two times a day (BID) | ORAL | Status: DC
  Administered 2013-10-14 – 2013-10-15 (×3): 40 mg via ORAL
  Filled 2013-10-14 (×5): qty 1

## 2013-10-14 MED ORDER — STERILE WATER FOR IRRIGATION IR SOLN
Status: DC | PRN
  Administered 2013-10-14: 11:00:00

## 2013-10-14 MED ORDER — BOOST / RESOURCE BREEZE PO LIQD
1.0000 | Freq: Three times a day (TID) | ORAL | Status: DC
  Administered 2013-10-14 – 2013-10-15 (×4): 1 via ORAL

## 2013-10-14 SURGICAL SUPPLY — 21 items
BLOCK BITE 60FR ADLT L/F BLUE (MISCELLANEOUS) IMPLANT
ELECT REM PT RETURN 9FT ADLT (ELECTROSURGICAL)
ELECTRODE REM PT RTRN 9FT ADLT (ELECTROSURGICAL) IMPLANT
FLOOR PAD 36X40 (MISCELLANEOUS) ×3
FORCEP COLD BIOPSY (CUTTING FORCEPS) IMPLANT
FORCEPS BIOP RAD 4 LRG CAP 4 (CUTTING FORCEPS) IMPLANT
FORMALIN 10 PREFIL 20ML (MISCELLANEOUS) IMPLANT
KIT CLEAN ENDO COMPLIANCE (KITS) ×3 IMPLANT
MANIFOLD NEPTUNE II (INSTRUMENTS) ×3 IMPLANT
NEEDLE SCLEROTHERAPY 25GX240 (NEEDLE) IMPLANT
PAD FLOOR 36X40 (MISCELLANEOUS) ×1 IMPLANT
PROBE APC STR FIRE (PROBE) IMPLANT
PROBE INJECTION GOLD (MISCELLANEOUS)
PROBE INJECTION GOLD 7FR (MISCELLANEOUS) IMPLANT
SNARE ROTATE MED OVAL 20MM (MISCELLANEOUS) IMPLANT
SNARE SHORT THROW 13M SML OVAL (MISCELLANEOUS) ×3 IMPLANT
SYR 50ML LL SCALE MARK (SYRINGE) ×3 IMPLANT
SYR INFLATION 60ML (SYRINGE) IMPLANT
TUBING ENDO SMARTCAP PENTAX (MISCELLANEOUS) ×3 IMPLANT
TUBING IRRIGATION ENDOGATOR (MISCELLANEOUS) ×3 IMPLANT
WATER STERILE IRR 1000ML POUR (IV SOLUTION) ×3 IMPLANT

## 2013-10-14 NOTE — Anesthesia Preprocedure Evaluation (Signed)
Anesthesia Evaluation  Patient identified by MRN, date of birth, ID band Patient awake    Reviewed: Allergy & Precautions, H&P , NPO status , Patient's Chart, lab work & pertinent test results  Airway Mallampati: II TM Distance: >3 FB     Dental  (+) Teeth Intact, Poor Dentition   Pulmonary Current Smoker,  breath sounds clear to auscultation        Cardiovascular negative cardio ROS  Rhythm:Regular Rate:Normal     Neuro/Psych    GI/Hepatic PUD, (+)     substance abuse  alcohol use and marijuana use,   Endo/Other    Renal/GU      Musculoskeletal   Abdominal   Peds  Hematology  (+) Blood dyscrasia (hx thrombocytopenia), ,   Anesthesia Other Findings   Reproductive/Obstetrics                           Anesthesia Physical Anesthesia Plan  ASA: III  Anesthesia Plan: MAC   Post-op Pain Management:    Induction: Intravenous  Airway Management Planned: Simple Face Mask  Additional Equipment:   Intra-op Plan:   Post-operative Plan:   Informed Consent: I have reviewed the patients History and Physical, chart, labs and discussed the procedure including the risks, benefits and alternatives for the proposed anesthesia with the patient or authorized representative who has indicated his/her understanding and acceptance.     Plan Discussed with:   Anesthesia Plan Comments:         Anesthesia Quick Evaluation

## 2013-10-14 NOTE — Clinical Social Work Note (Signed)
CSW completed SBIRT, patient scored 7 indicating low risk drinking.  Patient denies significant drinking, says "everyone has asked me the same questions", says she has been told her medical condition is unrelated to alcohol.  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  Says surgery should have follow up and monitoring but patient currently has no insurance and no transportation (does not drive).  Uses OTC pain medications to control back pain.  Did not want any resources related to drug or alcohol use.  Says she has no PCP and no funds to get medications other than OTCs.  Lives in [place] so referrals to [place] based resources are not practical.  CSW signing off as patient is declining any resources and expressed no concern about her current use of alcohol or drugs.  Edwyna Shell, LCSW Clinical Social Worker [phone])

## 2013-10-14 NOTE — Anesthesia Procedure Notes (Signed)
Procedure Name: MAC Date/Time: 10/14/2013 10:43 AM Performed by: Vista Deck Pre-anesthesia Checklist: Patient identified, Emergency Drugs available, Suction available, Timeout performed and Patient being monitored Patient Re-evaluated:Patient Re-evaluated prior to inductionOxygen Delivery Method: Non-rebreather mask

## 2013-10-14 NOTE — Op Note (Signed)
EGD PROCEDURE REPORT  PATIENT:  Olivia Lester  MR#:  [id number] Birthdate:  1976-06-28, 38 y.o., female Endoscopist:  Dr. Rogene Houston, MD Referred By:  Dr. Murray Hodgkins, MD Procedure Date: 10/14/2013  Procedure:   EGD  Indications:  Patient is a 38 year old Caucasian female who presents with upper abdominal pain nausea vomiting and also gives history of hematemesis. She uses OTC NSAIDs on a regular basis. History is also significant for alcohol abuse.            Informed Consent:  The risks, benefits, alternatives & imponderables which include, but are not limited to, bleeding, infection, perforation, drug reaction and potential missed lesion have been reviewed.  The potential for biopsy, lesion removal, esophageal dilation, etc. have also been discussed.  Questions have been answered.  All parties agreeable.  Please see history & physical in medical record for more information.  Medications:  Monitored anesthesia care. Cetacaine spray topically for oropharyngeal anesthesia  Description of procedure:  The endoscope was introduced through the mouth and advanced to the second portion of the duodenum without difficulty or limitations. The mucosal surfaces were surveyed very carefully during advancement of the scope and upon withdrawal.  Findings:  Esophagus:  Mucosa of the esophagus was normal. The GE junction was unremarkable. GEJ:  39 cm Stomach:  Stomach was empty and distended very well with insufflation. Folds in the proximal stomach were normal. Examination of mucosa at gastric body revealed patchy erythema and edema.  5 mm ulcer  Noted at antrum along the posterior wall along with prepyloric erosions and erythema. Pyloric channel was patent. Angularis fundus and cardia were unremarkable. Duodenum:  Patchy erythema and edema noted to bulbar mucosa without erosions or ulcers. Post bulbar mucosa was normal.  Therapeutic/Diagnostic Maneuvers Performed:  None  Complications:   None  Impression: 5 mm gastric ulcer at antrum. Gastroduodenitis.  Comment; These findings would appear to be secondary to NSAID therapy will check H. pylori serology.  Recommendations:  H. pylori serology. Advance diet. Change PPI to oral route.  [name] U  10/14/2013  11:05 AM  CC: Dr. Neale Burly, MD & Dr. Rayne Du ref. provider found

## 2013-10-14 NOTE — Care Management Note (Addendum)
    Page 1 of 1   10/15/2013     2:37:38 PM   CARE MANAGEMENT NOTE 10/15/2013  Patient:  Olivia Lester, Olivia Lester   Account Number:  [id number]  Date Initiated:  10/14/2013  Documentation initiated by:  Claretha Cooper  Subjective/Objective Assessment:   Pt admitted from home where she lives with her boyfriend. No HH needs identified.     Action/Plan:   Anticipated DC Date:  10/15/2013   Anticipated DC Plan:  HOME/SELF CARE      DC Planning Services  CM consult      Choice offered to / List presented to:             Status of service:  Completed, signed off Medicare Important Message given?   (If response is "NO", the following Medicare IM given date fields will be blank) Date Medicare IM given:   Date Additional Medicare IM given:    Discharge Disposition:    Per UR Regulation:    If discussed at Long Length of Stay Meetings, dates discussed:    Comments:  10/15/13 Claretha Cooper RN BSN CM Pt set up for [facility] for 10/24/13 at 2:45. Handout given for documents to take to [facility] and reminded to take $20.  10/14/13 Claretha Cooper RN BSN CM

## 2013-10-14 NOTE — Progress Notes (Signed)
PROGRESS NOTE  KEMIA WENDEL IOX:[id number] DOB: August 02, 1975 DOA: 10/12/2013 PCP: Neale Burly, MD [place] [facility]  Summary: 38 year old woman with history of alcohol abuse, query dependence, presented with nausea and vomiting, epigastric abdominal pain and history of hematemesis. Admitted to excessive alcohol intake. Admitted for further evaluation of epigastric abdominal pain, mild elevation of lipase, elevated LFTs, GI consultation. EGD revealed gastric ulcer and gastroduodenitis, secondary to NSAID use. Her clinical condition is proven, diet is advanced and discharged home into space 3/24.  Assessment/Plan: 1. Gastric ulcer and gastroduodenitis causing epigastric abdominal pain. Likely secondary to significant NSAID use her chronic low back pain. Status post endoscopy 3/23. . 2. Elevated transaminases. Likely secondary to alcoholic hepatitis superimposed on fatty liver. Doubt acute hepatitis C. 3. Elevated lipase, now normal. Doubt pancreatitis. 4. Thrombocytopenia. Likely myelosuppression secondary to alcohol abuse. Stable. 5. Hypokalemia. Repleted. 6. Alcohol abuse, query dependence. Social work consultation appreciated. Patient without interest in quitting. 7. Small dermoid tumor right ovary. Followup as an outpatient. 8. Tobacco dependence, marijuana use.  9. Hepatitis C antibody positive. If she stops consuming alcohol she may be candidate for treatment.   Advance diet. Likely discharge home tomorrow if stable. No NSAIDs.  I have strongly recommended she stop all alcohol use completely.  Dermoid tumor discussed with Dr. Glo Herring GYN--this is a completely benign tumor and no further evaluation is suggested. If it becomes symptomatic laparoscopic excision could be considered but this can be followed on outpatient basis at [facility].  Code Status: full code DVT prophylaxis: SCDs (thrombocytopenia)  Family Communication:  Disposition Plan: home when improved  Murray Hodgkins, MD  [facility]  Pager [phone] If 7PM-7AM, please contact night-coverage at [email or web address], password [facility] 10/14/2013, 1:09 PM  LOS: 2 days   Consultants:  Gastroenterology  Procedures:  EGD revealed 5 mm gastric ulcer at the antrum, gastroduodenitis. Thought to be secondary to NSAID therapy.  Antibiotics:    HPI/Subjective: Feels better s/p endoscopy. Less abdominal pain. Tolerated diet.  Objective: Filed Vitals:   10/14/13 1105 10/14/13 1115 10/14/13 1130 10/14/13 1142  BP: 138/94 131/95 128/78 139/109  Pulse: 83 90 102   Temp: 97.9 F (36.6 C)   97.9 F (36.6 C)  TempSrc:      Resp: 14 15 15 16   Height:      Weight:      SpO2: 100% 98% 100% 98%    Intake/Output Summary (Last 24 hours) at 10/14/13 1309 Last data filed at 10/14/13 1135  Gross per 24 hour  Intake 3445.83 ml  Output      0 ml  Net 3445.83 ml     Filed Weights   10/12/13 1207 10/12/13 1858  Weight: 56.7 kg (125 lb) 56.7 kg (125 lb)    Exam:   Afebrile, vital signs are stable. No hypoxia.  Gen. Appears calm and comfortable. Speech fluent and clear. Ambulating in the hallway.  Cardiovascular regular rate and rhythm. No murmur, rub or gallop.  Respiratory clear to auscultation bilaterally.  Data Reviewed:  Potassium normal. AST and ALT mildly elevated.  Platelet count stable at 89. Hemoglobin normal.  Hepatitis C antibody reactive  Scheduled Meds: . folic acid  1 mg Oral Daily  . LORazepam  0-4 mg Intravenous Q6H   Followed by  . LORazepam  0-4 mg Intravenous Q12H  . multivitamin with minerals  1 tablet Oral Daily  . nicotine  21 mg Transdermal Daily  . pantoprazole  40 mg Oral BID AC  . thiamine  100 mg Oral Daily   Or  . thiamine  100 mg Intravenous Daily   Continuous Infusions: . sodium chloride 75 mL/hr at 10/14/13 0500    Principal Problem:   Abdominal pain, epigastric Active Problems:   Duodenitis   Alcoholism   Thrombocytopenia   Time spent  25 minutes

## 2013-10-14 NOTE — Transfer of Care (Signed)
Immediate Anesthesia Transfer of Care Note  Patient: Olivia Lester  Procedure(s) Performed: Procedure(s) (LRB): ESOPHAGOGASTRODUODENOSCOPY (EGD) WITH PROPOFOL (N/A)  Patient Location: PACU  Anesthesia Type: MAC  Level of Consciousness: awake  Airway & Oxygen Therapy: Patient Spontanous Breathing. Non rebreather  Post-op Assessment: Report given to PACU RN, Post -op Vital signs reviewed and stable and Patient moving all extremities  Post vital signs: Reviewed and stable  Complications: No apparent anesthesia complications

## 2013-10-14 NOTE — Clinical Social Work Psychosocial (Signed)
Clinical Social Work Department BRIEF PSYCHOSOCIAL ASSESSMENT 10/14/2013  Patient:  Olivia Lester, Olivia Lester     Account Number:  [id number]     Admit date:  10/12/2013  Clinical Social Worker:  Edwyna Shell, [place]  Date/Time:  10/14/2013 08:30 AM  Referred by:  Physician  Date Referred:  10/14/2013 Referred for  Substance Abuse   Other Referral:   Interview type:  Patient Other interview type:    PSYCHOSOCIAL DATA Living Status:  SIGNIFICANT OTHER Admitted from facility:   Level of care:   Primary support name:  Juventino Slovak Primary support relationship to patient:  FRIEND Degree of support available:   Lives w significant other, provides emotional and financial support.    CURRENT CONCERNS Current Concerns  Substance Abuse   Other Concerns:    SOCIAL WORK ASSESSMENT / PLAN CSW met w patient at bedside, patient alert and oriented x4.  Referred by MD for substance use evaluation. Patient lives w boyfriend and 30% custody of her 40 year old son. Is currently unemployed, says she suffers from severe back pain which has not resolved.  Uses significant amounts of nonprescription pain relievers.  Is divorced, ex husband lives in [place], has two other children who live w their father.  Says she is currently in counseling due to stress and issues at home, did not describe further.     Guarded in her responses, denies significant use of substances, somewhat worried about current medical condition. Attributes severe stomach pain to overuse of Motrin and Goodys powders.  Says she did smoke marijuana on day of admissions but "its the one thing I can do to calm my stomach."  Says she has "severe stomach pain" which led to current admission.    Says she does drink alcohol, "perhaps more in the past month because things have been going on", admits to " 4-6 beers while I am working outside and get thirsty", says she does not drink every day.  Admits to drinking vodka "on occasion."   Unclear how much/how often she drinks vodka. States that she does not drink when her 11 year old son is in her home, has custody of him "30% of the time."  Son is w grandmother in [place] 40% and w father in [place] 30%.  Per mother, son is involved in baseball and band at [employer] and doing well.  Patient has has two younger children (4 and 82) who live w their father in [place].    Patient does not have drivers license - "I havent had one for years, didnt need it when I lived in [place]", rode her bicycle everywhere.  Does find it difficult to live without a car here, but has not done what she needs to get her license back.    CSW began administering SBIRT but was unable to complete as patient was taken down for medical procedure.  Will return and complete if possible.   Assessment/plan status:  Psychosocial Support/Ongoing Assessment of Needs Other assessment/ plan:   Information/referral to community resources:   Will offer AA meeting list and/or other SA tx resources if needed, patient is currently working w therapist who would also be able to address these needs.    PATIENT'S/FAMILY'S RESPONSE TO PLAN OF CARE: Patient guarded in her responses, appeared uncomfortable discussing substance use and anxious about upcoming medical procedure.        Edwyna Shell, LCSW Clinical Social Worker [phone])

## 2013-10-14 NOTE — Progress Notes (Signed)
INITIAL NUTRITION ASSESSMENT  DOCUMENTATION CODES Per approved criteria  -Not Applicable   INTERVENTION: Resource Breeze po TID, each supplement provides 250 kcal and 9 grams of protein  NUTRITION DIAGNOSIS: Inadequate oral intake related to poor appetite, altered Gi function as evidenced by diet hx.   Goal: Pt will meet >90% of estimated nutritional needs  Monitor:  Po intake, labs, skin assessments, weight changes, I/O's  Reason for Assessment: MST=2  38 y.o. female  Admitting Dx: Abdominal pain, epigastric  ASSESSMENT: Pt admitted with epigastric pain. PO intake data currently unavailable at this time, but noted poor appetite PTA. Pt with elevated lipase. Also noted drug screen tested positive for marijuana. There is a social work consult pending for substance abuse and apparently pt is in counseling for alcoholism and other domestic issues.  Noted UBW of 135#. Wt hx reveals a 5# (3.8%) wt loss x 1 month and 6 months, both of which are not clinically significant.  Suspect some undernutrition in relation to substance abuse, poor appetite, and weight loss, however, pt does not meet criteria at this time. She would benefit from nutritional supplement.  Due to pt down for EGD at time of visit, pt unable to participate in interview and physical exam.   Height: Ht Readings from Last 1 Encounters:  10/12/13 5\' 7"  (1.702 m)    Weight: Wt Readings from Last 1 Encounters:  10/12/13 125 lb (56.7 kg)    Ideal Body Weight: 135#  % Ideal Body Weight: 93%  Wt Readings from Last 10 Encounters:  10/12/13 125 lb (56.7 kg)  10/12/13 125 lb (56.7 kg)  09/25/13 130 lb (58.968 kg)  03/03/13 130 lb (58.968 kg)  06/10/12 125 lb (56.7 kg)  03/28/12 135 lb (61.236 kg)  12/23/11 135 lb (61.236 kg)    Usual Body Weight: 135#  % Usual Body Weight: 93%  BMI:  Body mass index is 19.57 kg/(m^2).  Meets criteria for normal weight.   Estimated Nutritional Needs: Kcal: 1418-1701  daily Protein: 56-71 grams daily Fluid: 1.4-1.7 L daily  Skin: scabs on arms  Diet Order: General  EDUCATION NEEDS: -Education not appropriate at this time   Intake/Output Summary (Last 24 hours) at 10/14/13 1422 Last data filed at 10/14/13 1300  Gross per 24 hour  Intake 3565.83 ml  Output      0 ml  Net 3565.83 ml    Last BM: *10/10/13  Labs:   Recent Labs Lab 10/12/13 1221 10/13/13 0456 10/14/13 0558  NA 139 140 138  K 3.6* 3.4* 4.4  CL 99 101 103  CO2 22 26 28   BUN 8 5* 3*  CREATININE 0.57 0.54 0.52  CALCIUM 9.9 7.8* 8.8  MG  --   --  1.7  GLUCOSE 124* 69* 79    CBG (last 3)  No results found for this basename: GLUCAP,  in the last 72 hours  Scheduled Meds: . folic acid  1 mg Oral Daily  . LORazepam  0-4 mg Intravenous Q6H   Followed by  . LORazepam  0-4 mg Intravenous Q12H  . multivitamin with minerals  1 tablet Oral Daily  . nicotine  21 mg Transdermal Daily  . pantoprazole  40 mg Oral BID AC  . thiamine  100 mg Oral Daily   Or  . thiamine  100 mg Intravenous Daily    Continuous Infusions: . sodium chloride 75 mL/hr at 10/14/13 0500    Past Medical History  Diagnosis Date  . Gastric ulcer   .  Back injury     Past Surgical History  Procedure Laterality Date  . Back surgery    . Tonsillectomy    . Breast surgery      Aseneth Hack A. Jimmye Norman, RD, LDN Pager: [phone]

## 2013-10-14 NOTE — Progress Notes (Signed)
Patient being taken down for EGD at this time

## 2013-10-14 NOTE — Anesthesia Postprocedure Evaluation (Signed)
Anesthesia Post Note  Patient: Olivia Lester  Procedure(s) Performed: Procedure(s) (LRB): ESOPHAGOGASTRODUODENOSCOPY (EGD) WITH PROPOFOL (N/A)  Anesthesia type: MAC  Patient location: PACU  Post pain: Pain level controlled  Post assessment: Post-op Vital signs reviewed, Patient's Cardiovascular Status Stable, Respiratory Function Stable, Patent Airway, No signs of Nausea or vomiting and Pain level controlled  Last Vitals:  Filed Vitals:   10/14/13 1105  BP: 138/94  Pulse: 83  Temp: 36.6 C  Resp: 14    Post vital signs: Reviewed and stable  Level of consciousness: awake and alert   Complications: No apparent anesthesia complications

## 2013-10-15 ENCOUNTER — Encounter (HOSPITAL_COMMUNITY): Payer: Self-pay | Admitting: Internal Medicine

## 2013-10-15 DIAGNOSIS — K259 Gastric ulcer, unspecified as acute or chronic, without hemorrhage or perforation: Secondary | ICD-10-CM

## 2013-10-15 DIAGNOSIS — K2970 Gastritis, unspecified, without bleeding: Secondary | ICD-10-CM

## 2013-10-15 DIAGNOSIS — B1920 Unspecified viral hepatitis C without hepatic coma: Secondary | ICD-10-CM

## 2013-10-15 DIAGNOSIS — K2990 Gastroduodenitis, unspecified, without bleeding: Secondary | ICD-10-CM

## 2013-10-15 MED ORDER — OXYCODONE HCL 5 MG PO TABS: 5.0000 mg | ORAL_TABLET | ORAL | Status: DC | PRN

## 2013-10-15 MED ORDER — LORAZEPAM 1 MG PO TABS: 1.0000 mg | ORAL_TABLET | Freq: Three times a day (TID) | ORAL | Status: DC | PRN

## 2013-10-15 MED ORDER — PANTOPRAZOLE SODIUM 40 MG PO TBEC: 40.0000 mg | DELAYED_RELEASE_TABLET | Freq: Two times a day (BID) | ORAL | Status: DC

## 2013-10-15 MED ORDER — SUCRALFATE 1 GM/10ML PO SUSP: 1.0000 g | Freq: Three times a day (TID) | ORAL | Status: DC

## 2013-10-15 MED ORDER — SUCRALFATE 1 GM/10ML PO SUSP
1.0000 g | Freq: Three times a day (TID) | ORAL | Status: DC
  Administered 2013-10-15: 1 g via ORAL
  Filled 2013-10-15: qty 10

## 2013-10-15 MED ORDER — OXYCODONE HCL 5 MG PO TABS
5.0000 mg | ORAL_TABLET | ORAL | Status: DC | PRN
  Administered 2013-10-15: 5 mg via ORAL
  Filled 2013-10-15: qty 1

## 2013-10-15 NOTE — Discharge Instructions (Signed)
Peptic Ulcer A peptic ulcer is a sore in the lining of in your esophagus (esophageal ulcer), stomach (gastric ulcer), or in the first part of your small intestine (duodenal ulcer). The ulcer causes erosion into the deeper tissue. CAUSES  Normally, the lining of the stomach and the small intestine protects itself from the acid that digests food. The protective lining can be damaged by:  An infection caused by a bacterium called Helicobacter pylori (H. pylori).  Regular use of nonsteroidal anti-inflammatory drugs (NSAIDs), such as ibuprofen or aspirin.  Smoking tobacco. Other risk factors include being older than 50, drinking alcohol excessively, and having a family history of ulcer disease.  SYMPTOMS   Burning pain or gnawing in the area between the chest and the belly button.  Heartburn.  Nausea and vomiting.  Bloating. The pain can be worse on an empty stomach and at night. If the ulcer results in bleeding, it can cause:  Black, tarry stools.  Vomiting of bright red blood.  Vomiting of coffee ground looking materials. DIAGNOSIS  A diagnosis is usually made based upon your history and an exam. Other tests and procedures may be performed to find the cause of the ulcer. Finding a cause will help determine the best treatment. Tests and procedures may include:  Blood tests, stool tests, or breath tests to check for the bacterium H. pylori.  An upper gastrointestinal (GI) series of the esophagus, stomach, and small intestine.  An endoscopy to examine the esophagus, stomach, and small intestine.  A biopsy. TREATMENT  Treatment may include:  Eliminating the cause of the ulcer, such as smoking, NSAIDs, or alcohol.  Medicines to reduce the amount of acid in your digestive tract.  Antibiotic medicines if the ulcer is caused by the H. pylori bacterium.  An upper endoscopy to treat a bleeding ulcer.  Surgery if the bleeding is severe or if the ulcer created a hole somewhere in the  digestive system. HOME CARE INSTRUCTIONS   Avoid tobacco, alcohol, and caffeine. Smoking can increase the acid in the stomach, and continued smoking will impair the healing of ulcers.  Avoid foods and drinks that seem to cause discomfort or aggravate your ulcer.  Only take medicines as directed by your caregiver. Do not substitute over-the-counter medicines for prescription medicines without talking to your caregiver.  Keep any follow-up appointments and tests as directed. SEEK MEDICAL CARE IF:   Your do not improve within 7 days of starting treatment.  You have ongoing indigestion or heartburn. SEEK IMMEDIATE MEDICAL CARE IF:   You have sudden, sharp, or persistent abdominal pain.  You have bloody or dark black, tarry stools.  You vomit blood or vomit that looks like coffee grounds.  You become light headed, weak, or feel faint.  You become sweaty or clammy. MAKE SURE YOU:   Understand these instructions.  Will watch your condition.  Will get help right away if you are not doing well or get worse. Document Released: 07/08/2000 Document Revised: 04/04/2012 Document Reviewed: 02/08/2012 ExitCare Patient Information 2014 [employer].  

## 2013-10-15 NOTE — Progress Notes (Deleted)
Physician Discharge Summary  Olivia Lester:[id number] DOB: 05-23-1976 DOA: 10/12/2013  PCP: Neale Burly, MD  Admit date: 10/12/2013 Discharge date: 10/15/2013  Time spent: 40 minutes  Recommendations for Outpatient Follow-up:  1. Patient will be contacted by Dr. Olevia Perches office regarding HCV RNA results 2. She has been referred [facility] to establish primary care  Discharge Diagnoses:  Principal Problem:   Gastric ulcer Active Problems:   Duodenitis   Alcoholism   Abdominal pain, epigastric   Thrombocytopenia   Transaminitis   Tobacco dependence   Hepatitis C   Discharge Condition: improved  Diet recommendation: low salt  Filed Weights   10/12/13 1207 10/12/13 1858  Weight: 56.7 kg (125 lb) 56.7 kg (125 lb)    History of present illness:  Olivia Lester is a 39 y.o. female  This is a 38 year old lady who had onset of epigastric pain this morning associated with nausea and vomiting. When she was questioned directly, she admits to hematemesis but cannot quantify. She denies any rectal bleeding. She has a previously documented history of excess alcohol intake. After further questioning, she admitted that she did drink more alcohol than she thinks she should and she is in counseling together with her boyfriend regarding other domestic issues and alcoholism seems to be one of them. She denies any fever.   Hospital Course:  This patient was admitted to the hospital with epigastric pain. She was taking NSAIDs at home prior to admission for chronic back pain. The patient also excessively drinks alcohol. She was seen by gastroenterology in the hospital and underwent EGD with results as below. Recommendations were to discontinue all nonsteroidal anti-inflammatories. She was also advised to stop drinking alcohol. She was started on Protonix twice a day as well as Carafate. She was found to have mildly elevated liver function tests. Hepatitis panel returned positive for hepatitis  C antibody. This could be causing her elevated liver function tests, although alcohol can also play a role. HCV RNA has been sent and is currently pending. This be followed up by gastroenterology service. She was strongly advised to stop drinking alcohol. At this time, she does not have any signs of alcohol withdrawal. She is ready for discharge home.  Procedures: EGD: 5 mm gastric ulcer at antrum.  Gastroduodenitis.    Consultations:  Gastroenterology, Dr. Laural Golden  Discharge Exam: Filed Vitals:   10/15/13 1421  BP: 148/67  Pulse: 81  Temp: 98.4 F (36.9 C)  Resp: 20    General: NAD Cardiovascular: S1 S2 RRR Respiratory: CTA B  Discharge Instructions  Discharge Orders   Future Orders Complete By Expires   Diet - low sodium heart healthy  As directed    Increase activity slowly  As directed        Medication List    STOP taking these medications       GOODY HEADACHE PO     ibuprofen 200 MG tablet  Commonly known as:  ADVIL,MOTRIN      TAKE these medications       diphenhydrAMINE 25 MG tablet  Commonly known as:  BENADRYL  Take 25 mg by mouth at bedtime as needed for sleep.     LORazepam 1 MG tablet  Commonly known as:  ATIVAN  Take 1 tablet (1 mg total) by mouth every 8 (eight) hours as needed for anxiety.     oxyCODONE 5 MG immediate release tablet  Commonly known as:  Oxy IR/ROXICODONE  Take 1 tablet (5 mg  total) by mouth every 4 (four) hours as needed for severe pain.     pantoprazole 40 MG tablet  Commonly known as:  PROTONIX  Take 1 tablet (40 mg total) by mouth 2 (two) times daily before a meal.     sucralfate 1 GM/10ML suspension  Commonly known as:  CARAFATE  Take 10 mLs (1 g total) by mouth 4 (four) times daily -  with meals and at bedtime.       No Known Allergies     Follow-up Information   Follow up with Doree Albee, MD On 10/24/2013. (at 12:50 pm)    Specialty:  Internal Medicine   Contact information:   [place] [address] [phone]       Follow up with [facility] On 10/24/2013. (at 2:45. Bring $20 and documents from handout.)    Contact information:   [place] [phone]      Please follow up. (Dr. Olevia Perches office will call you with hepatitis results)        The results of significant diagnostics from this hospitalization (including imaging, microbiology, ancillary and laboratory) are listed below for reference.    Significant Diagnostic Studies: Dg Chest 2 View  10/12/2013   CLINICAL DATA:  Abdominal pain, epigastric pain, shortness of breath  EXAM: CHEST  2 VIEW  COMPARISON:  10/05/2012  FINDINGS: Normal heart size, mediastinal contours, and pulmonary vascularity.  Minimal chronic bronchitic changes without infiltrate, pleural effusion or pneumothorax.  Bones unremarkable.  IMPRESSION: Minimal chronic bronchitic changes.  No acute abnormalities.   Electronically Signed   By: Lavonia Dana M.D.   On: 10/12/2013 16:00   Ct Head Wo Contrast  09/25/2013   CLINICAL DATA:  Patient fell from bar stool. Hit head on bar. Patient complains of confusion and blurred vision.  EXAM: CT HEAD WITHOUT CONTRAST  TECHNIQUE: Contiguous axial images were obtained from the base of the skull through the vertex without intravenous contrast.  COMPARISON:  CT HEAD W/O CM dated 09/24/2013  FINDINGS: There is no evidence of mass effect, midline shift or extra-axial fluid collections. There is no evidence of a space-occupying lesion or intracranial hemorrhage. There is no evidence of a cortical-based area of acute infarction.  The ventricles and sulci are appropriate for the patient's age. The basal cisterns are patent.  Visualized portions of the orbits are unremarkable. The visualized portions of the paranasal sinuses and mastoid air cells are unremarkable.  The osseous structures are unremarkable. There is right periorbital soft tissue swelling.  IMPRESSION: No acute intracranial pathology.   Electronically  Signed   By: Kathreen Devoid   On: 09/25/2013 20:57   Ct Head Wo Contrast  09/24/2013   CLINICAL DATA:  Fall. Loss of consciousness. Right-sided headache and right-sided neck pain.  EXAM: CT HEAD WITHOUT CONTRAST  CT CERVICAL SPINE WITHOUT CONTRAST  TECHNIQUE: Multidetector CT imaging of the head and cervical spine was performed following the standard protocol without intravenous contrast. Multiplanar CT image reconstructions of the cervical spine were also generated.  COMPARISON:  10/05/2012 head CT 12/18/2009 head CT and cervical spine CT.  FINDINGS: CT HEAD FINDINGS  Right frontal/supraorbital subcutaneous hematoma. No underlying fracture or intracranial hemorrhage.  No CT evidence of large acute infarct.  No hydrocephalus.  No intracranial mass lesion noted on this unenhanced exam.  Mastoid air cells, middle ear cavities and visualized paranasal sinuses are clear.  CT CERVICAL SPINE FINDINGS  No cervical spine fracture or malalignment.  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 If ligamentous injury or cord injury were of high clinical concern, MR imaging could be obtained for further delineation.  No abnormal prevertebral soft tissue swelling.  IMPRESSION: CT HEAD :  Right frontal/supraorbital subcutaneous hematoma. No underlying fracture or intracranial hemorrhage.  CT CERVICAL SPINE :  No cervical spine fracture or malalignment.  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  Please see above.   Electronically Signed   By: Chauncey Cruel M.D.   On: 09/24/2013 21:47   Ct Cervical Spine Wo Contrast  09/24/2013   CLINICAL DATA:  Fall. Loss of consciousness. Right-sided headache and right-sided neck pain.  EXAM: CT HEAD WITHOUT CONTRAST  CT CERVICAL SPINE WITHOUT CONTRAST  TECHNIQUE: Multidetector CT imaging of the head and cervical spine was performed following the standard protocol without intravenous contrast. Multiplanar CT image reconstructions of the cervical spine were also generated.  COMPARISON:   10/05/2012 head CT 12/18/2009 head CT and cervical spine CT.  FINDINGS: CT HEAD FINDINGS  Right frontal/supraorbital subcutaneous hematoma. No underlying fracture or intracranial hemorrhage.  No CT evidence of large acute infarct.  No hydrocephalus.  No intracranial mass lesion noted on this unenhanced exam.  Mastoid air cells, middle ear cavities and visualized paranasal sinuses are clear.  CT CERVICAL SPINE FINDINGS  No cervical spine fracture or malalignment.  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 If ligamentous injury or cord injury were of high clinical concern, MR imaging could be obtained for further delineation.  No abnormal prevertebral soft tissue swelling.  IMPRESSION: CT HEAD :  Right frontal/supraorbital subcutaneous hematoma. No underlying fracture or intracranial hemorrhage.  CT CERVICAL SPINE :  No cervical spine fracture or malalignment.  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  Please see above.   Electronically Signed   By: Chauncey Cruel M.D.   On: 09/24/2013 21:47   Ct Abdomen Pelvis W Contrast  10/12/2013   CLINICAL DATA:  Severe epigastric abdominal pain for 2 days, pain radiating through to back, history of gastric ulcer  EXAM: CT ABDOMEN AND PELVIS WITH CONTRAST  TECHNIQUE: Multidetector CT imaging of the abdomen and pelvis was performed using the standard protocol following bolus administration of intravenous contrast. Sagittal and coronal MPR images reconstructed from axial data set.  CONTRAST:  11mL OMNIPAQUE IOHEXOL 300 MG/ML SOLN IV, 53mL OMNIPAQUE IOHEXOL 300 MG/ML SOLN ORALLY  COMPARISON:  None  FINDINGS: Lung bases clear.  Diffuse fatty infiltration of liver.  More pronounced focal fatty infiltration adjacent to falciform fissure.  Liver, spleen, pancreas, kidneys, and adrenal glands normal appearance.  Question mild wall thickening of the duodenal bulb.  No periduodenal infiltrative changes, fluid or gas identified.  Stomach and bowel loops otherwise normal  appearance.  Scattered normal sized lymph nodes in upper abdomen.  Normal appendix.  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  Uterus, adnexae, bladder, and ureters otherwise normal appearance.  No mass, adenopathy, free fluid or inflammatory process.  Beam hardening artifacts secondary to orthopedic hardware within lumbar spine at L4-L5.  No acute osseous findings.  IMPRESSION: Mild duodenal bulb wall thickening could reflect duodenitis or ulcer disease.  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  Fatty infiltration of liver.  No other intra-abdominal or intrapelvic process identified.   Electronically Signed   By: Lavonia Dana M.D.   On: 10/12/2013 15:57    Microbiology: No results found for this or any previous visit (from the past  240 hour(s)).   Labs: Basic Metabolic Panel:  Recent Labs Lab 10/12/13 1221 10/13/13 0456 10/14/13 0558  NA 139 140 138  K 3.6* 3.4* 4.4  CL 99 101 103  CO2 22 26 28   GLUCOSE 124* 69* 79  BUN 8 5* 3*  CREATININE 0.57 0.54 0.52  CALCIUM 9.9 7.8* 8.8  MG  --   --  1.7   Liver Function Tests:  Recent Labs Lab 10/12/13 1221 10/13/13 0456 10/14/13 0558  AST 144* 86* 142*  ALT 101* 69* 94*  ALKPHOS 72 55 56  BILITOT 0.4 0.4 0.5  PROT 8.0 6.3 6.5  ALBUMIN 4.3 3.3* 3.5    Recent Labs Lab 10/12/13 1221 10/13/13 0456  LIPASE 74* 47   No results found for this basename: AMMONIA,  in the last 168 hours CBC:  Recent Labs Lab 10/12/13 1221 10/13/13 0456 10/14/13 0558  WBC 10.1 5.7 4.5  NEUTROABS 8.5*  --   --   HGB 15.5* 12.9 14.2  HCT 43.8 37.6 41.5  MCV 95.2 97.4 98.3  PLT 126* 86* 89*   Cardiac Enzymes:  Recent Labs Lab 10/12/13 1221  TROPONINI <0.30   BNP: BNP (last 3 results) No results found for this basename: PROBNP,  in the last 8760 hours CBG: No results found for this basename: GLUCAP,  in the last 168 hours     Signed:  Ricco Dershem  [facility] 10/15/2013, 5:12 PM

## 2013-10-15 NOTE — Progress Notes (Signed)
IV removed. Discharge instructions reviewed with patient. Understanding verbalized. Ambulated out for discharge home .

## 2013-10-15 NOTE — Progress Notes (Signed)
Subjective; Patient denies nausea and vomiting. She does not have good appetite.  Objective; BP 140/92  Pulse 80  Temp(Src) 97.7 F (36.5 C) (Oral)  Resp 20  Ht 5\' 7"  (1.702 m)  Wt 125 lb (56.7 kg)  BMI 19.57 kg/m2  SpO2 99%  LMP 09/22/2013 Patient is alert in no acute distress. Abdomen is flat soft and nontender. Liver is not enlarged.  Lab data; Hepatitis B surface antigen is negative. HCV antibody is reactive. AST and ALT 142 and 94 respectively from 10/14/2013  Assessment; #1. Gastric ulcer and gastritis. Etiology suspected to be NSAID use. H. pylori has been ordered. #2. Elevated transaminases. HCV antibodies reactive and patient is quite surprised to know the result. Suspect hepatic injury is secondary to alcohol and possibly due to HCV unless HCVRNA is negative. #3. Alcohol abuse. #4. Thrombocytopenia. No findings to suggest cirrhosis or portal hypertension.  Recommendations; HCVRNA by PCR quantitative and genotype.

## 2013-10-16 LAB — HCV RNA QUANT RFLX ULTRA OR GENOTYP
HCV QUANT LOG: 5.77 {Log} — AB (ref <1.18)
HCV QUANT: 582895 [IU]/mL — AB (ref <15)

## 2013-10-16 LAB — H. PYLORI ANTIBODY, IGG: H PYLORI IGG: 0.45 {ISR}
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
## 2013-10-18 LAB — HEPATITIS C GENOTYPE

## 2013-10-21 ENCOUNTER — Telehealth (INDEPENDENT_AMBULATORY_CARE_PROVIDER_SITE_OTHER): Payer: Self-pay | Admitting: Internal Medicine

## 2013-10-22 NOTE — Telephone Encounter (Signed)
Dr.Rehman made aware. He will call patient tomorrow morning.

## 2013-10-22 NOTE — Telephone Encounter (Signed)
Patient is returning Dr. Olevia Perches call. Please call her at [phone].

## 2013-10-29 NOTE — Telephone Encounter (Signed)
Results of H. pylori and HCVRNA have been reviewed with patient. She will need office visit in 3 months.

## 2013-10-30 NOTE — Progress Notes (Signed)
LM for patient to return the call at [phone]. 

## 2013-11-08 NOTE — Progress Notes (Signed)
Apt has been scheduled for 02/04/14 with Deberah Castle, NP.

## 2013-11-21 ENCOUNTER — Emergency Department (HOSPITAL_COMMUNITY)
Admission: EM | Admit: 2013-11-21 | Discharge: 2013-11-22 | Disposition: A | Discharge status: Home / Self Care | Payer: Self-pay | Attending: Emergency Medicine | Admitting: Emergency Medicine

## 2013-11-21 ENCOUNTER — Encounter (HOSPITAL_COMMUNITY): Payer: Self-pay | Admitting: Emergency Medicine

## 2013-11-21 DIAGNOSIS — R059 Cough, unspecified: Secondary | ICD-10-CM | POA: Insufficient documentation

## 2013-11-21 DIAGNOSIS — K2990 Gastroduodenitis, unspecified, without bleeding: Secondary | ICD-10-CM

## 2013-11-21 DIAGNOSIS — R42 Dizziness and giddiness: Secondary | ICD-10-CM | POA: Insufficient documentation

## 2013-11-21 DIAGNOSIS — F17200 Nicotine dependence, unspecified, uncomplicated: Secondary | ICD-10-CM | POA: Insufficient documentation

## 2013-11-21 DIAGNOSIS — K259 Gastric ulcer, unspecified as acute or chronic, without hemorrhage or perforation: Secondary | ICD-10-CM | POA: Insufficient documentation

## 2013-11-21 DIAGNOSIS — Z9889 Other specified postprocedural states: Secondary | ICD-10-CM | POA: Insufficient documentation

## 2013-11-21 DIAGNOSIS — F1010 Alcohol abuse, uncomplicated: Secondary | ICD-10-CM | POA: Insufficient documentation

## 2013-11-21 DIAGNOSIS — R109 Unspecified abdominal pain: Secondary | ICD-10-CM

## 2013-11-21 DIAGNOSIS — Z91199 Patient's noncompliance with other medical treatment and regimen due to unspecified reason: Secondary | ICD-10-CM | POA: Insufficient documentation

## 2013-11-21 DIAGNOSIS — B1920 Unspecified viral hepatitis C without hepatic coma: Secondary | ICD-10-CM | POA: Insufficient documentation

## 2013-11-21 DIAGNOSIS — F10929 Alcohol use, unspecified with intoxication, unspecified: Secondary | ICD-10-CM

## 2013-11-21 DIAGNOSIS — Z87828 Personal history of other (healed) physical injury and trauma: Secondary | ICD-10-CM | POA: Insufficient documentation

## 2013-11-21 DIAGNOSIS — F329 Major depressive disorder, single episode, unspecified: Secondary | ICD-10-CM | POA: Insufficient documentation

## 2013-11-21 DIAGNOSIS — F3289 Other specified depressive episodes: Secondary | ICD-10-CM | POA: Insufficient documentation

## 2013-11-21 DIAGNOSIS — R197 Diarrhea, unspecified: Secondary | ICD-10-CM

## 2013-11-21 DIAGNOSIS — R112 Nausea with vomiting, unspecified: Secondary | ICD-10-CM

## 2013-11-21 DIAGNOSIS — R05 Cough: Secondary | ICD-10-CM | POA: Insufficient documentation

## 2013-11-21 DIAGNOSIS — Z9119 Patient's noncompliance with other medical treatment and regimen: Secondary | ICD-10-CM

## 2013-11-21 DIAGNOSIS — Z79899 Other long term (current) drug therapy: Secondary | ICD-10-CM | POA: Insufficient documentation

## 2013-11-21 DIAGNOSIS — K2970 Gastritis, unspecified, without bleeding: Secondary | ICD-10-CM | POA: Insufficient documentation

## 2013-11-21 LAB — ETHANOL: Alcohol, Ethyl (B): 338 mg/dL — ABNORMAL HIGH (ref 0–11)

## 2013-11-21 LAB — CBC WITH DIFFERENTIAL/PLATELET
BASOS ABS: 0 10*3/uL (ref 0.0–0.1)
Basophils Relative: 0 % (ref 0–1)
EOS ABS: 0.1 10*3/uL (ref 0.0–0.7)
Eosinophils Relative: 2 % (ref 0–5)
HCT: 35.8 % — ABNORMAL LOW (ref 36.0–46.0)
Hemoglobin: 12.6 g/dL (ref 12.0–15.0)
Lymphocytes Relative: 44 % (ref 12–46)
Lymphs Abs: 2.5 10*3/uL (ref 0.7–4.0)
MCH: 33.2 pg (ref 26.0–34.0)
MCHC: 35.2 g/dL (ref 30.0–36.0)
MCV: 94.2 fL (ref 78.0–100.0)
Monocytes Absolute: 0.3 10*3/uL (ref 0.1–1.0)
Monocytes Relative: 6 % (ref 3–12)
NEUTROS PCT: 48 % (ref 43–77)
Neutro Abs: 2.8 10*3/uL (ref 1.7–7.7)
PLATELETS: 178 10*3/uL (ref 150–400)
RBC: 3.8 MIL/uL — AB (ref 3.87–5.11)
RDW: 13.6 % (ref 11.5–15.5)
WBC: 5.8 10*3/uL (ref 4.0–10.5)

## 2013-11-21 LAB — COMPREHENSIVE METABOLIC PANEL
ALBUMIN: 3.6 g/dL (ref 3.5–5.2)
ALK PHOS: 52 U/L (ref 39–117)
ALT: 77 U/L — AB (ref 0–35)
AST: 100 U/L — AB (ref 0–37)
BUN: 6 mg/dL (ref 6–23)
CALCIUM: 8.9 mg/dL (ref 8.4–10.5)
CO2: 24 mEq/L (ref 19–32)
Chloride: 107 mEq/L (ref 96–112)
Creatinine, Ser: 0.55 mg/dL (ref 0.50–1.10)
GFR calc non Af Amer: >90 mL/min (ref >90)
GLUCOSE: 92 mg/dL (ref 70–99)
POTASSIUM: 4 meq/L (ref 3.7–5.3)
SODIUM: 143 meq/L (ref 137–147)
TOTAL PROTEIN: 6.9 g/dL (ref 6.0–8.3)
Total Bilirubin: <0.2 mg/dL — ABNORMAL LOW (ref 0.3–1.2)

## 2013-11-21 LAB — RAPID URINE DRUG SCREEN, HOSP PERFORMED
Amphetamines: NOT DETECTED
BARBITURATES: NOT DETECTED
BENZODIAZEPINES: NOT DETECTED
COCAINE: NOT DETECTED
Opiates: NOT DETECTED
Tetrahydrocannabinol: POSITIVE — AB

## 2013-11-21 LAB — URINALYSIS, ROUTINE W REFLEX MICROSCOPIC
Bilirubin Urine: NEGATIVE
Glucose, UA: NEGATIVE mg/dL
Hgb urine dipstick: NEGATIVE
Ketones, ur: NEGATIVE mg/dL
LEUKOCYTES UA: NEGATIVE
Nitrite: NEGATIVE
PH: 5.5 (ref 5.0–8.0)
Protein, ur: NEGATIVE mg/dL
SPECIFIC GRAVITY, URINE: 1.01 (ref 1.005–1.030)
Urobilinogen, UA: 0.2 mg/dL (ref 0.0–1.0)

## 2013-11-21 LAB — APTT: aPTT: 29 seconds (ref 24–37)

## 2013-11-21 LAB — PROTIME-INR
INR: 0.87 (ref 0.00–1.49)
PROTHROMBIN TIME: 11.7 s (ref 11.6–15.2)

## 2013-11-21 MED ORDER — ONDANSETRON HCL 4 MG/2ML IJ SOLN
4.0000 mg | Freq: Once | INTRAMUSCULAR | Status: AC
  Administered 2013-11-21: 4 mg via INTRAVENOUS

## 2013-11-21 MED ORDER — ONDANSETRON HCL 4 MG/2ML IJ SOLN
4.0000 mg | Freq: Once | INTRAMUSCULAR | Status: DC
Start: 2013-11-21 — End: 2013-11-21
  Filled 2013-11-21: qty 2

## 2013-11-21 MED ORDER — SODIUM CHLORIDE 0.9 % IV SOLN: 1000.0000 mL | INTRAVENOUS | Status: DC

## 2013-11-21 MED ORDER — ONDANSETRON HCL 4 MG/2ML IJ SOLN
4.0000 mg | Freq: Once | INTRAMUSCULAR | Status: AC
  Administered 2013-11-21: 4 mg via INTRAVENOUS
  Filled 2013-11-21: qty 2

## 2013-11-21 MED ORDER — SODIUM CHLORIDE 0.9 % IV SOLN
INTRAVENOUS | Status: DC
Start: 2013-11-21 — End: 2013-11-22
  Administered 2013-11-21: 23:00:00 via INTRAVENOUS

## 2013-11-21 MED ORDER — GI COCKTAIL ~~LOC~~
30.0000 mL | Freq: Once | ORAL | Status: AC
  Administered 2013-11-21: 30 mL via ORAL
  Filled 2013-11-21: qty 30

## 2013-11-21 MED ORDER — SODIUM CHLORIDE 0.9 % IV SOLN
1000.0000 mL | Freq: Once | INTRAVENOUS | Status: AC
  Administered 2013-11-21: 1000 mL via INTRAVENOUS

## 2013-11-21 MED ORDER — FAMOTIDINE 20 MG PO TABS
20.0000 mg | ORAL_TABLET | Freq: Once | ORAL | Status: AC
  Administered 2013-11-21: 20 mg via ORAL
  Filled 2013-11-21: qty 1

## 2013-11-21 MED ORDER — DIPHENHYDRAMINE HCL 50 MG/ML IJ SOLN
25.0000 mg | Freq: Once | INTRAMUSCULAR | Status: AC
  Administered 2013-11-21: 25 mg via INTRAVENOUS
  Filled 2013-11-21: qty 1

## 2013-11-21 MED ORDER — METOCLOPRAMIDE HCL 5 MG/ML IJ SOLN
10.0000 mg | Freq: Once | INTRAMUSCULAR | Status: AC
  Administered 2013-11-21: 10 mg via INTRAVENOUS
  Filled 2013-11-21: qty 2

## 2013-11-21 MED ORDER — FENTANYL CITRATE 0.05 MG/ML IJ SOLN
50.0000 ug | Freq: Once | INTRAMUSCULAR | Status: AC
  Administered 2013-11-21: 50 ug via INTRAVENOUS
  Filled 2013-11-21: qty 2

## 2013-11-21 NOTE — ED Notes (Signed)
Per EMS, patient was discharged in March with a diagnosis of Hepatitis C.  Patient c/o increased pain to RUQ x 3 days that is intolerable.  Patient states she also has a golf-ball size cyst on her right ovary.  Patient has not had any pain medication, so she has been taking Goody powder with liquor for pain.  Strong smell of ETOH on breath.
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
## 2013-11-21 NOTE — ED Notes (Signed)
Pt requesting more pain medication, MD notified, no new orders obtained at this time

## 2013-11-22 MED ORDER — ONDANSETRON HCL 4 MG PO TABS: 4.0000 mg | ORAL_TABLET | Freq: Three times a day (TID) | ORAL | Status: DC | PRN

## 2013-11-22 NOTE — Discharge Instructions (Signed)
YOU NEED TO FOLLOW UP AS ARRANGED FOR YOU WHEN YOU LEFT THE HOSPITAL. You were to follow up with Dr Laural Golden, the gastroenteroloigst. YOU NEED TO STOP TAKING THE GOODYS AND IBUPROFEN FOR YOUR BACK PAIN OR YOU WILL GET AN ULCER. Continue taking your carafate and prilosec for your gastritis. You also need TO STOP DRINKING!! It is bad for your liver, especially with your hepatitis C and it will also make your gastritis worse. Consider going to Dr Deboraha Sprang clinic as it was arranged for you when you left the hospital and go to [facility] in [place] to get help to stop drinking.   Return if you get worse symptoms.  Use the zofran for nausea or vomiting.

## 2013-12-07 ENCOUNTER — Emergency Department: Payer: Self-pay | Admitting: Emergency Medicine

## 2013-12-07 LAB — URINALYSIS, COMPLETE
Bilirubin,UR: NEGATIVE
Glucose,UR: NEGATIVE mg/dL (ref 0–75)
KETONE: NEGATIVE
Leukocyte Esterase: NEGATIVE
NITRITE: NEGATIVE
PROTEIN: NEGATIVE
Ph: 6 (ref 4.5–8.0)
RBC,UR: <1 /HPF (ref 0–5)
Specific Gravity: 1.006 (ref 1.003–1.030)
Squamous Epithelial: 2
WBC UR: 1 /HPF (ref 0–5)

## 2013-12-07 LAB — COMPREHENSIVE METABOLIC PANEL
ANION GAP: 10 (ref 7–16)
Albumin: 4.6 g/dL (ref 3.4–5.0)
Alkaline Phosphatase: 96 U/L
BUN: 7 mg/dL (ref 7–18)
Bilirubin,Total: 0.5 mg/dL (ref 0.2–1.0)
CO2: 25 mmol/L (ref 21–32)
CREATININE: 0.46 mg/dL — AB (ref 0.60–1.30)
Calcium, Total: 8.8 mg/dL (ref 8.5–10.1)
Chloride: 107 mmol/L (ref 98–107)
EGFR (African American): >60
EGFR (Non-African Amer.): >60
GLUCOSE: 87 mg/dL (ref 65–99)
Osmolality: 280 (ref 275–301)
Potassium: 3.7 mmol/L (ref 3.5–5.1)
SGOT(AST): 365 U/L — ABNORMAL HIGH (ref 15–37)
SGPT (ALT): 284 U/L — ABNORMAL HIGH (ref 12–78)
SODIUM: 142 mmol/L (ref 136–145)
Total Protein: 8.9 g/dL — ABNORMAL HIGH (ref 6.4–8.2)

## 2013-12-07 LAB — CBC WITH DIFFERENTIAL/PLATELET
BASOS ABS: 0.1 10*3/uL (ref 0.0–0.1)
Basophil %: 1.3 %
EOS PCT: 1.1 %
Eosinophil #: 0.1 10*3/uL (ref 0.0–0.7)
HCT: 41.6 % (ref 35.0–47.0)
HGB: 14.2 g/dL (ref 12.0–16.0)
LYMPHS ABS: 3.2 10*3/uL (ref 1.0–3.6)
Lymphocyte %: 55.1 %
MCH: 33.4 pg (ref 26.0–34.0)
MCHC: 34.1 g/dL (ref 32.0–36.0)
MCV: 98 fL (ref 80–100)
Monocyte #: 0.2 x10 3/mm (ref 0.2–0.9)
Monocyte %: 3.9 %
Neutrophil #: 2.3 10*3/uL (ref 1.4–6.5)
Neutrophil %: 38.6 %
Platelet: 155 10*3/uL (ref 150–440)
RBC: 4.25 10*6/uL (ref 3.80–5.20)
RDW: 14.4 % (ref 11.5–14.5)
WBC: 5.8 10*3/uL (ref 3.6–11.0)

## 2013-12-07 LAB — LIPASE, BLOOD: Lipase: 188 U/L (ref 73–393)

## 2014-02-04 ENCOUNTER — Ambulatory Visit (INDEPENDENT_AMBULATORY_CARE_PROVIDER_SITE_OTHER): Payer: Self-pay | Admitting: Internal Medicine

## 2014-02-24 ENCOUNTER — Telehealth (INDEPENDENT_AMBULATORY_CARE_PROVIDER_SITE_OTHER): Payer: Self-pay | Admitting: *Deleted

## 2014-02-24 ENCOUNTER — Encounter (INDEPENDENT_AMBULATORY_CARE_PROVIDER_SITE_OTHER): Payer: Self-pay | Admitting: *Deleted

## 2014-02-24 NOTE — Telephone Encounter (Signed)
Olivia Lester No Showed for her apt on 02/04/14 with Deberah Castle, NP. A letter has been sent to the patient.

## 2014-03-17 ENCOUNTER — Other Ambulatory Visit (HOSPITAL_COMMUNITY): Payer: Self-pay | Admitting: Nurse Practitioner

## 2014-03-17 DIAGNOSIS — N949 Unspecified condition associated with female genital organs and menstrual cycle: Secondary | ICD-10-CM

## 2014-03-20 ENCOUNTER — Ambulatory Visit (HOSPITAL_COMMUNITY)
Admission: RE | Admit: 2014-03-20 | Discharge: 2014-03-20 | Disposition: A | Discharge status: Home / Self Care | Payer: Self-pay | Source: Ambulatory Visit | Attending: Nurse Practitioner | Admitting: Nurse Practitioner

## 2014-03-20 DIAGNOSIS — D279 Benign neoplasm of unspecified ovary: Secondary | ICD-10-CM | POA: Insufficient documentation

## 2014-03-20 DIAGNOSIS — N949 Unspecified condition associated with female genital organs and menstrual cycle: Secondary | ICD-10-CM | POA: Insufficient documentation

## 2014-04-01 ENCOUNTER — Emergency Department (HOSPITAL_COMMUNITY): Payer: Self-pay

## 2014-04-01 ENCOUNTER — Encounter (HOSPITAL_COMMUNITY): Payer: Self-pay | Admitting: Emergency Medicine

## 2014-04-01 ENCOUNTER — Emergency Department (HOSPITAL_COMMUNITY)
Admission: EM | Admit: 2014-04-01 | Discharge: 2014-04-01 | Disposition: A | Discharge status: Home / Self Care | Payer: Self-pay | Attending: Emergency Medicine | Admitting: Emergency Medicine

## 2014-04-01 DIAGNOSIS — Z87828 Personal history of other (healed) physical injury and trauma: Secondary | ICD-10-CM | POA: Insufficient documentation

## 2014-04-01 DIAGNOSIS — S8990XA Unspecified injury of unspecified lower leg, initial encounter: Secondary | ICD-10-CM | POA: Insufficient documentation

## 2014-04-01 DIAGNOSIS — X500XXA Overexertion from strenuous movement or load, initial encounter: Secondary | ICD-10-CM | POA: Insufficient documentation

## 2014-04-01 DIAGNOSIS — Z8719 Personal history of other diseases of the digestive system: Secondary | ICD-10-CM | POA: Insufficient documentation

## 2014-04-01 DIAGNOSIS — S93409A Sprain of unspecified ligament of unspecified ankle, initial encounter: Secondary | ICD-10-CM | POA: Insufficient documentation

## 2014-04-01 DIAGNOSIS — F17200 Nicotine dependence, unspecified, uncomplicated: Secondary | ICD-10-CM | POA: Insufficient documentation

## 2014-04-01 DIAGNOSIS — Y9389 Activity, other specified: Secondary | ICD-10-CM | POA: Insufficient documentation

## 2014-04-01 DIAGNOSIS — S99919A Unspecified injury of unspecified ankle, initial encounter: Secondary | ICD-10-CM

## 2014-04-01 DIAGNOSIS — Z8619 Personal history of other infectious and parasitic diseases: Secondary | ICD-10-CM | POA: Insufficient documentation

## 2014-04-01 DIAGNOSIS — R296 Repeated falls: Secondary | ICD-10-CM | POA: Insufficient documentation

## 2014-04-01 DIAGNOSIS — W172XXA Fall into hole, initial encounter: Secondary | ICD-10-CM | POA: Insufficient documentation

## 2014-04-01 DIAGNOSIS — S99929A Unspecified injury of unspecified foot, initial encounter: Secondary | ICD-10-CM

## 2014-04-01 DIAGNOSIS — Y929 Unspecified place or not applicable: Secondary | ICD-10-CM | POA: Insufficient documentation

## 2014-04-01 DIAGNOSIS — S93402A Sprain of unspecified ligament of left ankle, initial encounter: Secondary | ICD-10-CM

## 2014-04-01 MED ORDER — HYDROCODONE-ACETAMINOPHEN 5-325 MG PO TABS: 1.0000 | ORAL_TABLET | ORAL | Status: DC | PRN

## 2014-04-01 MED ORDER — HYDROCODONE-ACETAMINOPHEN 5-325 MG PO TABS
1.0000 | ORAL_TABLET | Freq: Once | ORAL | Status: AC
  Administered 2014-04-01: 1 via ORAL
  Filled 2014-04-01: qty 1

## 2014-04-01 NOTE — ED Provider Notes (Signed)
CSN: [id number]     Arrival date & time 04/01/14  3267 History   First MD Initiated Contact with Patient 04/01/14 [phone]     Chief Complaint  Patient presents with  . Ankle Pain     (Consider location/radiation/quality/duration/timing/severity/associated sxs/prior Treatment) Patient is a 38 y.o. female presenting with ankle pain. The history is provided by the patient.  Ankle Pain Location:  Ankle Time since incident:  1 day Injury: yes   Mechanism of injury: fall   Fall:    Impact surface:  Dirt   Point of impact:  Knees Ankle location:  L ankle Pain details:    Quality:  Aching and shooting   Radiates to:  Does not radiate   Severity:  Moderate   Onset quality:  Sudden   Timing:  Constant   Progression:  Worsening Chronicity:  New Dislocation: no   Foreign body present:  No foreign bodies Tetanus status:  Up to date Prior injury to area:  No Relieved by:  Nothing Worsened by:  Bearing weight  Olivia Lester is a 38 y.o. female who presents to the ED with left ankle pain that started last night s/p fall. She states she stepped in a hole and twisted her ankle and then fell. She scraped her right knee but it does not hurt. She denies any other injuries. She is up to date on her tetanus.  Past Medical History  Diagnosis Date  . Gastric ulcer   . Back injury   . Hepatitis C 10/14/2013  . Gastric ulcer 10/14/2013   Past Surgical History  Procedure Laterality Date  . Back surgery    . Tonsillectomy    . Breast surgery    . Esophagogastroduodenoscopy (egd) with propofol N/A 10/14/2013    Procedure: ESOPHAGOGASTRODUODENOSCOPY (EGD) WITH PROPOFOL;  Surgeon: Rogene Houston, MD;  Location: [facility];  Service: Endoscopy;  Laterality: N/A;   History reviewed. No pertinent family history. History  Substance Use Topics  . Smoking status: Current Every Day Smoker -- 1.00 packs/day for 20 years    Types: Cigarettes  . Smokeless tobacco: Not on file  . Alcohol Use: 7.2 oz/week    12  Cans of beer per week     Comment: 6-12 cans of beer every other day    OB History   Grav Para Term Preterm Abortions TAB SAB Ect Mult Living                 Review of Systems Negative except as stated in HPI   Allergies  Review of patient's allergies indicates no known allergies.  Home Medications   Prior to Admission medications   Medication Sig Start Date End Date Taking? Authorizing Provider  Multiple Vitamin (MULTIVITAMIN WITH MINERALS) TABS tablet Take 1 tablet by mouth daily.    Historical Provider, MD  ondansetron (ZOFRAN) 4 MG tablet Take 1 tablet (4 mg total) by mouth every 8 (eight) hours as needed for nausea or vomiting. 11/22/13   Janice Norrie, MD  pantoprazole (PROTONIX) 40 MG tablet Take 1 tablet (40 mg total) by mouth 2 (two) times daily before a meal. 10/15/13   Kathie Dike, MD   BP 168/112  Pulse 95  Temp(Src) 98.6 F (37 C) (Oral)  Resp 17  SpO2 100%  LMP 03/28/2014 Physical Exam  Nursing note and vitals reviewed. Constitutional: She is oriented to person, place, and time. She appears well-developed and well-nourished. No distress.  HENT:  Head: Normocephalic and atraumatic.  Eyes:  Conjunctivae and EOM are normal.  Neck: Neck supple.  Pulmonary/Chest: Effort normal.  Musculoskeletal:       Left ankle: She exhibits normal range of motion, no ecchymosis, no deformity, no laceration and normal pulse. Swelling: minimal. Tenderness. Lateral malleolus tenderness found. Achilles tendon normal.  Pedal pulse strong, adequate circulation, good touch sensation. Pain with palpation of lateral ankle and with range of motion.   Neurological: She is alert and oriented to person, place, and time. No cranial nerve deficit.  Skin: Skin is warm and dry.    ED Course  Procedures ( Dg Ankle Complete Left  04/01/2014   CLINICAL DATA:  ANKLE PAIN ANKLE PAIN lateral LEFT ankle pain. Injury 03/31/2014. Subacute ankle pain. Recurrent injury. Old fracture.  EXAM: LEFT ANKLE  COMPLETE - 3+ VIEW  COMPARISON:  12/30/2007.  FINDINGS: The talar dome is intact. Mild lateral malleolar soft tissue swelling. The alignment of the ankle is anatomic. The ankle mortise is congruent. Ankle effusion is present compatible with recent injury. Unchanged appearance of the dorsal navicular bone, probably representing partially united accessory ossicle. Os trigonum noted. Cortical irregularity is present along the lateral malleolus associated with previous avulsion fracture.  IMPRESSION: No acute osseous injury. Old posttraumatic changes of the ankle. Mild lateral malleolar soft tissue swelling and ankle effusion.   Electronically Signed   By: Dereck Ligas M.D.   On: 04/01/2014 08:24    MDM  38 y.o. female with left ankle pain s/p injury last pm. Placed in ASO, crutches for ambulation, ice, elevation and follow up with ortho if symptoms persist. Elevated BP that patient should follow up with PCP for further evaluation. Stable for discharge without neurovascular deficits.    Medication List    TAKE these medications       HYDROcodone-acetaminophen 5-325 MG per tablet  Commonly known as:  NORCO/VICODIN  Take 1 tablet by mouth every 4 (four) hours as needed.      ASK your doctor about these medications       multivitamin with minerals Tabs tablet  Take 1 tablet by mouth daily.     ondansetron 4 MG tablet  Commonly known as:  ZOFRAN  Take 1 tablet (4 mg total) by mouth every 8 (eight) hours as needed for nausea or vomiting.     pantoprazole 40 MG tablet  Commonly known as:  PROTONIX  Take 1 tablet (40 mg total) by mouth 2 (two) times daily before a meal.           Ashley Murrain, NP 04/01/14 [phone]

## 2014-04-01 NOTE — Discharge Instructions (Signed)

## 2014-04-01 NOTE — ED Notes (Signed)
Pt fell last night and now c/oleft ankle pain. Mild anterior swelling noted. Nad. Pedal pulses normal.

## 2014-04-02 NOTE — ED Provider Notes (Signed)
Medical screening examination/treatment/procedure(s) were performed by non-physician practitioner and as supervising physician I was immediately available for consultation/collaboration.   EKG Interpretation None       Nat Christen, MD 04/02/14 [phone]

## 2014-06-13 ENCOUNTER — Encounter: Payer: Self-pay | Admitting: Obstetrics and Gynecology

## 2014-06-13 ENCOUNTER — Ambulatory Visit (INDEPENDENT_AMBULATORY_CARE_PROVIDER_SITE_OTHER): Payer: Medicaid Other | Admitting: Obstetrics and Gynecology

## 2014-06-13 ENCOUNTER — Other Ambulatory Visit (HOSPITAL_COMMUNITY)
Admission: RE | Admit: 2014-06-13 | Discharge: 2014-06-13 | Disposition: A | Discharge status: Home / Self Care | Payer: Medicaid Other | Source: Ambulatory Visit | Attending: Obstetrics and Gynecology | Admitting: Obstetrics and Gynecology

## 2014-06-13 VITALS — BP 120/70 | Ht 67.0 in | Wt 122.0 lb

## 2014-06-13 DIAGNOSIS — Z113 Encounter for screening for infections with a predominantly sexual mode of transmission: Secondary | ICD-10-CM | POA: Insufficient documentation

## 2014-06-13 DIAGNOSIS — Z308 Encounter for other contraceptive management: Secondary | ICD-10-CM

## 2014-06-13 DIAGNOSIS — Z3049 Encounter for surveillance of other contraceptives: Secondary | ICD-10-CM

## 2014-06-13 DIAGNOSIS — Z01419 Encounter for gynecological examination (general) (routine) without abnormal findings: Secondary | ICD-10-CM | POA: Insufficient documentation

## 2014-06-13 DIAGNOSIS — R87810 Cervical high risk human papillomavirus (HPV) DNA test positive: Secondary | ICD-10-CM | POA: Insufficient documentation

## 2014-06-13 DIAGNOSIS — Z1151 Encounter for screening for human papillomavirus (HPV): Secondary | ICD-10-CM | POA: Insufficient documentation

## 2014-06-13 MED ORDER — MEDROXYPROGESTERONE ACETATE 10 MG PO TABS: 10.0000 mg | ORAL_TABLET | Freq: Every day | ORAL | Status: DC

## 2014-06-13 NOTE — Progress Notes (Signed)
Patient ID: Olivia Lester, female   DOB: Jul 06, 1976, 38 y.o.   MRN: [id number] Pt here today for annual exam. Pt states that she was diagnosed with an ovarian cyst on her right ovary.   Assessment:  Annual Gyn Exam Irregular menses, suspected anovulation. 1st degree uterine relaxation Dyspareunia   Plan:  1. pap smear done, next pap due 3-5-yr 2. return in 3+ wk for repeat exam 3    Annual mammogram advised Subjective:  Olivia Lester is a 38 y.o. female No obstetric history on file. who presents for annual exam. Patient's last menstrual period was 06/06/2014. The patient has complaints today of CYST 3 months ago dx'd in ED when seen for rlq pain. Irregular menses x last 3 months. + uterine contact on exam reproduces dyspareunia  The following portions of the patient's history were reviewed and updated as appropriate: allergies, current medications, past family history, past medical history, past social history, past surgical history and problem list. Past Medical History  Diagnosis Date  . Gastric ulcer   . Back injury   . Hepatitis C 10/14/2013  . Gastric ulcer 10/14/2013    Past Surgical History  Procedure Laterality Date  . Back surgery    . Tonsillectomy    . Breast surgery    . Esophagogastroduodenoscopy (egd) with propofol N/A 10/14/2013    Procedure: ESOPHAGOGASTRODUODENOSCOPY (EGD) WITH PROPOFOL;  Surgeon: Rogene Houston, MD;  Location: [facility];  Service: Endoscopy;  Laterality: N/A;    Current outpatient prescriptions: pantoprazole (PROTONIX) 40 MG tablet, Take 1 tablet (40 mg total) by mouth 2 (two) times daily before a meal., Disp: 60 tablet, Rfl: 1  Review of Systems Constitutional: chronic back pain due to disc injury and surgery Gastrointestinal: neg Genitourinary: dyspareunia, lite irreg menses x 3 months  Objective:  BP 120/70 mmHg  Ht 5\' 7"  (1.702 m)  Wt 122 lb (55.339 kg)  BMI 19.10 kg/m2  LMP 06/06/2014   BMI: Body mass index is 19.1 kg/(m^2).  General  Appearance: Alert, appropriate appearance for age. No acute distress HEENT: Grossly normal Neck / Thyroid:  Cardiovascular: RRR; normal S1, S2, no murmur Lungs: CTA bilaterally Back: No CVAT Breast Exam: No dimpling, nipple retraction or discharge. No masses or nodes., Normal to inspection, Normal breast tissue bilaterally, s/p biopsy on left, stable irregular tissue in that area and No masses or nodes.No dimpling, nipple retraction or discharge. Gastrointestinal: Soft, non-tender, no masses or organomegaly Pelvic Exam: Vulva and vagina appear normal. Bimanual exam reveals normal uterus and adnexa. Uterus: mid-position, tender and first degree descensus Rectovaginal:  Lymphatic Exam: Non-palpable nodes in neck, clavicular, axillary, or inguinal regions Skin: no rash or abnormalities Neurologic: Normal gait and speech, no tremor  Psychiatric: Alert and oriented, appropriate affect.  Urinalysis:negative preg test  Olivia Lester. MD Pgr [phone] 1:40 PM

## 2014-06-13 NOTE — Patient Instructions (Signed)
Anovulation is the likely explanaiton of the cyst and the light periods

## 2014-06-14 LAB — HIV ANTIBODY (ROUTINE TESTING W REFLEX): HIV: NONREACTIVE

## 2014-06-14 LAB — RPR

## 2014-06-16 LAB — HSV 2 ANTIBODY, IGG: HSV 2 Glycoprotein G Ab, IgG: <0.1 IV

## 2014-06-16 LAB — CYTOLOGY - PAP

## 2014-08-03 ENCOUNTER — Emergency Department (HOSPITAL_COMMUNITY)
Admission: EM | Admit: 2014-08-03 | Discharge: 2014-08-03 | Disposition: A | Discharge status: Home / Self Care | Payer: Self-pay | Attending: Emergency Medicine | Admitting: Emergency Medicine

## 2014-08-03 ENCOUNTER — Emergency Department (HOSPITAL_COMMUNITY): Payer: Self-pay

## 2014-08-03 ENCOUNTER — Encounter (HOSPITAL_COMMUNITY): Payer: Self-pay

## 2014-08-03 DIAGNOSIS — Z87820 Personal history of traumatic brain injury: Secondary | ICD-10-CM | POA: Insufficient documentation

## 2014-08-03 DIAGNOSIS — Z3202 Encounter for pregnancy test, result negative: Secondary | ICD-10-CM | POA: Insufficient documentation

## 2014-08-03 DIAGNOSIS — W108XXA Fall (on) (from) other stairs and steps, initial encounter: Secondary | ICD-10-CM | POA: Insufficient documentation

## 2014-08-03 DIAGNOSIS — Y998 Other external cause status: Secondary | ICD-10-CM | POA: Insufficient documentation

## 2014-08-03 DIAGNOSIS — S20212A Contusion of left front wall of thorax, initial encounter: Secondary | ICD-10-CM | POA: Insufficient documentation

## 2014-08-03 DIAGNOSIS — Y9289 Other specified places as the place of occurrence of the external cause: Secondary | ICD-10-CM | POA: Insufficient documentation

## 2014-08-03 DIAGNOSIS — Z8619 Personal history of other infectious and parasitic diseases: Secondary | ICD-10-CM | POA: Insufficient documentation

## 2014-08-03 DIAGNOSIS — S3991XA Unspecified injury of abdomen, initial encounter: Secondary | ICD-10-CM | POA: Insufficient documentation

## 2014-08-03 DIAGNOSIS — Z79899 Other long term (current) drug therapy: Secondary | ICD-10-CM | POA: Insufficient documentation

## 2014-08-03 DIAGNOSIS — S20211A Contusion of right front wall of thorax, initial encounter: Secondary | ICD-10-CM | POA: Insufficient documentation

## 2014-08-03 DIAGNOSIS — W19XXXA Unspecified fall, initial encounter: Secondary | ICD-10-CM

## 2014-08-03 DIAGNOSIS — Y9389 Activity, other specified: Secondary | ICD-10-CM | POA: Insufficient documentation

## 2014-08-03 DIAGNOSIS — Z8719 Personal history of other diseases of the digestive system: Secondary | ICD-10-CM | POA: Insufficient documentation

## 2014-08-03 LAB — URINALYSIS, ROUTINE W REFLEX MICROSCOPIC
Bilirubin Urine: NEGATIVE
Glucose, UA: NEGATIVE mg/dL
Hgb urine dipstick: NEGATIVE
KETONES UR: NEGATIVE mg/dL
LEUKOCYTES UA: NEGATIVE
Nitrite: NEGATIVE
Protein, ur: NEGATIVE mg/dL
Urobilinogen, UA: 0.2 mg/dL (ref 0.0–1.0)
pH: 7 (ref 5.0–8.0)

## 2014-08-03 LAB — PREGNANCY, URINE: Preg Test, Ur: NEGATIVE

## 2014-08-03 MED ORDER — OXYCODONE-ACETAMINOPHEN 5-325 MG PO TABS
1.0000 | ORAL_TABLET | Freq: Once | ORAL | Status: AC
  Administered 2014-08-03: 1 via ORAL
  Filled 2014-08-03: qty 1

## 2014-08-03 MED ORDER — HYDROCODONE-ACETAMINOPHEN 5-325 MG PO TABS: ORAL_TABLET | ORAL | Status: DC

## 2014-08-03 NOTE — Discharge Instructions (Signed)
Rib Contusion °A rib contusion (bruise) can occur by a blow to the chest or by a fall against a hard object. Usually these will be much better in a couple weeks. If X-rays were taken today and there are no broken bones (fractures), the diagnosis of bruising is made. However, broken ribs may not show up for several days, or may be discovered later on a routine X-ray when signs of healing show up. If this happens to you, it does not mean that something was missed on the X-ray, but simply that it did not show up on the first X-rays. Earlier diagnosis will not usually change the treatment. °HOME CARE INSTRUCTIONS  °· Avoid strenuous activity. Be careful during activities and avoid bumping the injured ribs. Activities that pull on the injured ribs and cause pain should be avoided, if possible. °· For the first day or two, an ice pack used every 20 minutes while awake may be helpful. Put ice in a plastic bag and put a towel between the bag and the skin. °· Eat a normal, well-balanced diet. Drink plenty of fluids to avoid constipation. °· Take deep breaths several times a day to keep lungs free of infection. Try to cough several times a day. Splint the injured area with a pillow while coughing to ease pain. Coughing can help prevent pneumonia. °· Wear a rib belt or binder only if told to do so by your caregiver. If you are wearing a rib belt or binder, you must do the breathing exercises as directed by your caregiver. If not used properly, rib belts or binders restrict breathing which can lead to pneumonia. °· Only take over-the-counter or prescription medicines for pain, discomfort, or fever as directed by your caregiver. °SEEK MEDICAL CARE IF:  °· You or your child has an oral temperature above 102° F (38.9° C). °· Your baby is older than 3 months with a rectal temperature of 100.5° F (38.1° C) or higher for more than 1 day. °· You develop a cough, with thick or bloody sputum. °SEEK IMMEDIATE MEDICAL CARE IF:  °· You  have difficulty breathing. °· You feel sick to your stomach (nausea), have vomiting or belly (abdominal) pain. °· You have worsening pain, not controlled with medications, or there is a change in the location of the pain. °· You develop sweating or radiation of the pain into the arms, jaw or shoulders, or become light headed or faint. °· You or your child has an oral temperature above 102° F (38.9° C), not controlled by medicine. °· Your or your baby is older than 3 months with a rectal temperature of 102° F (38.9° C) or higher. °· Your baby is 3 months old or younger with a rectal temperature of 100.4° F (38° C) or higher. °MAKE SURE YOU:  °· Understand these instructions. °· Will watch your condition. °· Will get help right away if you are not doing well or get worse. °Document Released: 04/05/2001 Document Revised: 11/05/2012 Document Reviewed: 02/27/2008 °Exit[employer]® Patient Information ©2015 [employer]. This information is not intended to replace advice given to you by your health care provider. Make sure you discuss any questions you have with your health care provider. ° °

## 2014-08-03 NOTE — ED Notes (Signed)
Pt reports slipped on wet steps carrying a load of wood.  C/O bilateral rib pain that is worse with movement.  Denies SOB but says hurts to take deep breath.

## 2014-08-03 NOTE — ED Provider Notes (Signed)
CSN: [id number]     Arrival date & time 08/03/14  3474 History  This chart was scribed for non-physician practitioner Kem Parkinson, PA-C working with Ezequiel Essex, MD by Hilda Lias, ED Scribe. This patient was seen in room [facility] and the patient's care was started at 11:34 AM.    Chief Complaint  Patient presents with  . Fall   The history is provided by the patient. No language interpreter was used.    HPI Comments: Olivia Lester is a 39 y.o. female who presents to the Emergency Department complaining of constant bilateral rib pain as well as lower back pain with associated hematuria that has been present for two days after a fall that occurred at her home. Pt states that she was walking up the stairs to her house and carrying a load of wood in her left arm. Pt states that she slipped and landed on the stairs on her right side and lower back area, and that the wood she was carrying landed on her left side. Pt states that pain is worse with movement. Pt also notes that her left thigh is bruised from the wood landing on her as well. Pt states that it hurts to take deep breaths, and also notes having dark colored urine that she is concerned may be blood since the incident occurred. Pt notes that she gets kidney stones frequently, she denies abdominal pain, vomiting, numbness or weakness of the lower extremities, shortness of breath , head injury or LOC.    Past Medical History  Diagnosis Date  . Gastric ulcer   . Back injury   . Hepatitis C 10/14/2013  . Gastric ulcer 10/14/2013   Past Surgical History  Procedure Laterality Date  . Back surgery    . Tonsillectomy    . Breast surgery    . Esophagogastroduodenoscopy (egd) with propofol N/A 10/14/2013    Procedure: ESOPHAGOGASTRODUODENOSCOPY (EGD) WITH PROPOFOL;  Surgeon: Rogene Houston, MD;  Location: [facility];  Service: Endoscopy;  Laterality: N/A;   Family History  Problem Relation Age of Onset  . Cancer Mother 34    ovarian    . Hypertension Mother   . Early death Father   . Alcohol abuse Father    History  Substance Use Topics  . Smoking status: Current Every Day Smoker -- 1.00 packs/day for 20 years    Types: Cigarettes  . Smokeless tobacco: Never Used  . Alcohol Use: Yes     Comment: occ   OB History    No data available     Review of Systems  Constitutional: Negative for activity change and appetite change.  Respiratory: Negative for chest tightness and shortness of breath.   Cardiovascular: Positive for chest pain (lateral chest wall tenderness).  Gastrointestinal: Negative for nausea, vomiting, abdominal pain and abdominal distention.  Genitourinary: Positive for hematuria and flank pain. Negative for dysuria and difficulty urinating.  Musculoskeletal: Positive for back pain and arthralgias. Negative for myalgias and neck pain.  Neurological: Negative for dizziness, syncope, weakness, numbness and headaches.  All other systems reviewed and are negative.     Allergies  Ibuprofen and Other  Home Medications   Prior to Admission medications   Medication Sig Start Date End Date Taking? Authorizing Provider  diphenhydrAMINE (BENADRYL) 25 mg capsule Take 50 mg by mouth every 6 (six) hours as needed for allergies or sleep.    Yes Historical Provider, MD  ibuprofen (ADVIL,MOTRIN) 200 MG tablet Take 800 mg by mouth every 8 (  eight) hours as needed (pain).   Yes Historical Provider, MD  medroxyPROGESTERone (PROVERA) 10 MG tablet Take 1 tablet (10 mg total) by mouth daily. Patient not taking: Reported on 08/03/2014 06/13/14   Jonnie Kind, MD  pantoprazole (PROTONIX) 40 MG tablet Take 1 tablet (40 mg total) by mouth 2 (two) times daily before a meal. Patient not taking: Reported on 08/03/2014 10/15/13   Kathie Dike, MD   BP 139/93 mmHg  Pulse 78  Temp(Src) 99.7 F (37.6 C) (Core (Comment))  Resp 20  Ht 5\' 7"  (1.702 m)  Wt 115 lb (52.164 kg)  BMI 18.01 kg/m2  SpO2 99%  LMP  07/08/2014 Physical Exam  Constitutional: She is oriented to person, place, and time. She appears well-developed and well-nourished.  HENT:  Head: Normocephalic and atraumatic.  Neck: Normal range of motion, full passive range of motion without pain and phonation normal. Neck supple.  Cardiovascular: Normal rate, regular rhythm, normal heart sounds and intact distal pulses.   No murmur heard. Pulmonary/Chest: Effort normal and breath sounds normal. No respiratory distress. She exhibits tenderness.  Chest wall: diffuse tenderness to bilateral lateral chest wall and flank with multiple ecchymosis present.  Left greater than right . Mild splinting on left   Abdominal: Soft. She exhibits no distension. There is no tenderness. There is no rebound and no guarding.  Musculoskeletal: Normal range of motion. She exhibits no edema or tenderness.  Full ROM bilateral hips  Neurological: She is alert and oriented to person, place, and time. She exhibits normal muscle tone. Coordination normal.  Skin: Skin is warm and dry.  Ecchymosis present left iliac crest  No edema   Psychiatric: She has a normal mood and affect.  Nursing note and vitals reviewed.   ED Course  Procedures (including critical care time)  DIAGNOSTIC STUDIES: Oxygen Saturation is 99% on RA, normal by my interpretation.    COORDINATION OF CARE: 11:38 AM Discussed treatment plan with pt at bedside and pt agreed to plan.   Labs Review Labs Reviewed  URINALYSIS, ROUTINE W REFLEX MICROSCOPIC - Abnormal; Notable for the following:    Specific Gravity, Urine <1.005 (*)    All other components within normal limits  PREGNANCY, URINE    Imaging Review Dg Ribs Bilateral W/chest  08/03/2014   CLINICAL DATA:  Patient fell down steps 1 day prior. Bruising after fall  EXAM: BILATERAL RIBS AND CHEST - 4+ VIEW  COMPARISON:  Chest radiograph October 12, 2013  FINDINGS: Frontal chest as well as bilateral oblique and cone-down lower rib images  were obtained. Lungs are clear. Heart size and pulmonary vascularity are normal. No adenopathy. No pneumothorax or effusion. No demonstrable rib fracture.  IMPRESSION: No demonstrable rib fracture.  Lungs clear.   Electronically Signed   By: Lowella Grip M.D.   On: 08/03/2014 12:20     EKG Interpretation None      MDM   Final diagnoses:  Fall, initial encounter  Rib contusion, left, initial encounter  Rib contusion, right, initial encounter    Pt ambulated to bathroom without difficultly.  No focal neuro deficits.  VSS.  abd remains soft, NT.  CXR neg for fx's, no hematuria on u/a.  Likely contusions of chest wall.  She appears stable for d/c and agrees to close PMD f/u.    I personally performed the services described in this documentation, which was scribed in my presence. The recorded information has been reviewed and is accurate.    Orlena Garmon L. Derrill Bagnell,  PA-C 08/05/14 1555  Ezequiel Essex, MD 08/06/14 1622

## 2014-09-15 ENCOUNTER — Ambulatory Visit: Payer: Medicaid Other | Admitting: Obstetrics and Gynecology

## 2014-09-15 ENCOUNTER — Encounter: Payer: Self-pay | Admitting: Obstetrics and Gynecology

## 2015-03-09 ENCOUNTER — Emergency Department (HOSPITAL_COMMUNITY)
Admission: EM | Admit: 2015-03-09 | Discharge: 2015-03-09 | Disposition: A | Discharge status: Home / Self Care | Payer: Medicaid Other | Attending: Emergency Medicine | Admitting: Emergency Medicine

## 2015-03-09 ENCOUNTER — Encounter (HOSPITAL_COMMUNITY): Payer: Self-pay | Admitting: *Deleted

## 2015-03-09 DIAGNOSIS — K2920 Alcoholic gastritis without bleeding: Secondary | ICD-10-CM

## 2015-03-09 DIAGNOSIS — R17 Unspecified jaundice: Secondary | ICD-10-CM

## 2015-03-09 DIAGNOSIS — Z720 Tobacco use: Secondary | ICD-10-CM | POA: Insufficient documentation

## 2015-03-09 DIAGNOSIS — Z8619 Personal history of other infectious and parasitic diseases: Secondary | ICD-10-CM | POA: Insufficient documentation

## 2015-03-09 DIAGNOSIS — Z87828 Personal history of other (healed) physical injury and trauma: Secondary | ICD-10-CM | POA: Insufficient documentation

## 2015-03-09 LAB — CBC
HEMATOCRIT: 36.7 % (ref 36.0–46.0)
Hemoglobin: 12.5 g/dL (ref 12.0–15.0)
MCH: 30.3 pg (ref 26.0–34.0)
MCHC: 34.1 g/dL (ref 30.0–36.0)
MCV: 89.1 fL (ref 78.0–100.0)
PLATELETS: 203 10*3/uL (ref 150–400)
RBC: 4.12 MIL/uL (ref 3.87–5.11)
RDW: 12.8 % (ref 11.5–15.5)
WBC: 10.4 10*3/uL (ref 4.0–10.5)

## 2015-03-09 LAB — COMPREHENSIVE METABOLIC PANEL
ALBUMIN: 4.1 g/dL (ref 3.5–5.0)
ALT: 41 U/L (ref 14–54)
AST: 37 U/L (ref 15–41)
Alkaline Phosphatase: 59 U/L (ref 38–126)
Anion gap: 13 (ref 5–15)
BILIRUBIN TOTAL: 1.5 mg/dL — AB (ref 0.3–1.2)
BUN: 9 mg/dL (ref 6–20)
CHLORIDE: 98 mmol/L — AB (ref 101–111)
CO2: 23 mmol/L (ref 22–32)
CREATININE: 0.59 mg/dL (ref 0.44–1.00)
Calcium: 8.6 mg/dL — ABNORMAL LOW (ref 8.9–10.3)
GFR calc Af Amer: >60 mL/min (ref >60)
GLUCOSE: 95 mg/dL (ref 65–99)
POTASSIUM: 3.4 mmol/L — AB (ref 3.5–5.1)
Sodium: 134 mmol/L — ABNORMAL LOW (ref 135–145)
TOTAL PROTEIN: 6.8 g/dL (ref 6.5–8.1)

## 2015-03-09 LAB — DIFFERENTIAL
Basophils Absolute: 0 10*3/uL (ref 0.0–0.1)
Basophils Relative: 0 % (ref 0–1)
EOS ABS: 0 10*3/uL (ref 0.0–0.7)
EOS PCT: 0 % (ref 0–5)
LYMPHS ABS: 1.6 10*3/uL (ref 0.7–4.0)
Lymphocytes Relative: 16 % (ref 12–46)
MONO ABS: 0.6 10*3/uL (ref 0.1–1.0)
MONOS PCT: 6 % (ref 3–12)
Neutro Abs: 7.9 10*3/uL — ABNORMAL HIGH (ref 1.7–7.7)
Neutrophils Relative %: 78 % — ABNORMAL HIGH (ref 43–77)

## 2015-03-09 LAB — URINALYSIS, ROUTINE W REFLEX MICROSCOPIC
BILIRUBIN URINE: NEGATIVE
Glucose, UA: NEGATIVE mg/dL
HGB URINE DIPSTICK: NEGATIVE
KETONES UR: 40 mg/dL — AB
Leukocytes, UA: NEGATIVE
Nitrite: NEGATIVE
PH: 6 (ref 5.0–8.0)
Protein, ur: NEGATIVE mg/dL
SPECIFIC GRAVITY, URINE: 1.01 (ref 1.005–1.030)
Urobilinogen, UA: 0.2 mg/dL (ref 0.0–1.0)

## 2015-03-09 LAB — LIPASE, BLOOD: Lipase: 15 U/L — ABNORMAL LOW (ref 22–51)

## 2015-03-09 MED ORDER — METOCLOPRAMIDE HCL 5 MG/ML IJ SOLN: 10.0000 mg | Freq: Once | INTRAMUSCULAR | Status: DC

## 2015-03-09 MED ORDER — SODIUM CHLORIDE 0.9 % IV SOLN
1000.0000 mL | Freq: Once | INTRAVENOUS | Status: AC
  Administered 2015-03-09: 1000 mL via INTRAVENOUS

## 2015-03-09 MED ORDER — GI COCKTAIL ~~LOC~~
30.0000 mL | Freq: Once | ORAL | Status: AC
  Administered 2015-03-09: 30 mL via ORAL
  Filled 2015-03-09: qty 30

## 2015-03-09 MED ORDER — ONDANSETRON HCL 4 MG/2ML IJ SOLN
4.0000 mg | Freq: Once | INTRAMUSCULAR | Status: AC
  Administered 2015-03-09: 4 mg via INTRAVENOUS
  Filled 2015-03-09: qty 2

## 2015-03-09 MED ORDER — METOCLOPRAMIDE HCL 5 MG/ML IJ SOLN
10.0000 mg | Freq: Once | INTRAMUSCULAR | Status: AC
  Administered 2015-03-09: 10 mg via INTRAVENOUS
  Filled 2015-03-09: qty 2

## 2015-03-09 MED ORDER — PANTOPRAZOLE SODIUM 40 MG IV SOLR
40.0000 mg | Freq: Once | INTRAVENOUS | Status: AC
  Administered 2015-03-09: 40 mg via INTRAVENOUS
  Filled 2015-03-09: qty 40

## 2015-03-09 MED ORDER — METOCLOPRAMIDE HCL 10 MG PO TABS: 10.0000 mg | ORAL_TABLET | Freq: Four times a day (QID) | ORAL | Status: DC | PRN

## 2015-03-09 MED ORDER — MORPHINE SULFATE 4 MG/ML IJ SOLN
4.0000 mg | Freq: Once | INTRAMUSCULAR | Status: AC
  Administered 2015-03-09: 4 mg via INTRAVENOUS
  Filled 2015-03-09: qty 1

## 2015-03-09 MED ORDER — ONDANSETRON 4 MG PO TBDP
4.0000 mg | ORAL_TABLET | Freq: Once | ORAL | Status: AC | PRN
  Administered 2015-03-09: 4 mg via ORAL
  Filled 2015-03-09: qty 1

## 2015-03-09 MED ORDER — SODIUM CHLORIDE 0.9 % IV SOLN: 1000.0000 mL | INTRAVENOUS | Status: DC

## 2015-03-09 NOTE — Discharge Instructions (Signed)
Take ranitidine (Zantac) twice a day. IF that is not helping, take omeprazole (Prilosec OTC) once a day. Do not drink any alcohol!! Take antacids as needed for pain.   Gastritis, Adult Gastritis is soreness and swelling (inflammation) of the lining of the stomach. Gastritis can develop as a sudden onset (acute) or long-term (chronic) condition. If gastritis is not treated, it can lead to stomach bleeding and ulcers. CAUSES  Gastritis occurs when the stomach lining is weak or damaged. Digestive juices from the stomach then inflame the weakened stomach lining. The stomach lining may be weak or damaged due to viral or bacterial infections. One common bacterial infection is the Helicobacter pylori infection. Gastritis can also result from excessive alcohol consumption, taking certain medicines, or having too much acid in the stomach.  SYMPTOMS  In some cases, there are no symptoms. When symptoms are present, they may include:  Pain or a burning sensation in the upper abdomen.  Nausea.  Vomiting.  An uncomfortable feeling of fullness after eating. DIAGNOSIS  Your caregiver may suspect you have gastritis based on your symptoms and a physical exam. To determine the cause of your gastritis, your caregiver may perform the following:  Blood or stool tests to check for the H pylori bacterium.  Gastroscopy. A thin, flexible tube (endoscope) is passed down the esophagus and into the stomach. The endoscope has a light and camera on the end. Your caregiver uses the endoscope to view the inside of the stomach.  Taking a tissue sample (biopsy) from the stomach to examine under a microscope. TREATMENT  Depending on the cause of your gastritis, medicines may be prescribed. If you have a bacterial infection, such as an H pylori infection, antibiotics may be given. If your gastritis is caused by too much acid in the stomach, H2 blockers or antacids may be given. Your caregiver may recommend that you stop taking  aspirin, ibuprofen, or other nonsteroidal anti-inflammatory drugs (NSAIDs). HOME CARE INSTRUCTIONS  Only take over-the-counter or prescription medicines as directed by your caregiver.  If you were given antibiotic medicines, take them as directed. Finish them even if you start to feel better.  Drink enough fluids to keep your urine clear or pale yellow.  Avoid foods and drinks that make your symptoms worse, such as:  Caffeine or alcoholic drinks.  Chocolate.  Peppermint or mint flavorings.  Garlic and onions.  Spicy foods.  Citrus fruits, such as oranges, lemons, or limes.  Tomato-based foods such as sauce, chili, salsa, and pizza.  Fried and fatty foods.  Eat small, frequent meals instead of large meals. SEEK IMMEDIATE MEDICAL CARE IF:   You have black or dark red stools.  You vomit blood or material that looks like coffee grounds.  You are unable to keep fluids down.  Your abdominal pain gets worse.  You have a fever.  You do not feel better after 1 week.  You have any other questions or concerns. MAKE SURE YOU:  Understand these instructions.  Will watch your condition.  Will get help right away if you are not doing well or get worse. Document Released: 07/05/2001 Document Revised: 01/10/2012 Document Reviewed: 08/24/2011 [facility] Patient Information 2015 [place], [place]. This information is not intended to replace advice given to you by your health care provider. Make sure you discuss any questions you have with your health care provider.   Emergency Department Resource Guide 1) Find a Doctor and Pay Out of Pocket Although you won't have to find out who is  covered by your insurance plan, it is a good idea to ask around and get recommendations. You will then need to call the office and see if the doctor you have chosen will accept you as a new patient and what types of options they offer for patients who are self-pay. Some doctors offer discounts or will set  up payment plans for their patients who do not have insurance, but you will need to ask so you aren't surprised when you get to your appointment.  2) Contact Your Local Health Department Not all health departments have doctors that can see patients for sick visits, but many do, so it is worth a call to see if yours does. If you don't know where your local health department is, you can check in your phone book. The CDC also has a tool to help you locate your state's health department, and many state websites also have listings of all of their local health departments.  3) Find a [facility] If your illness is not likely to be very severe or complicated, you may want to try a walk in clinic. These are popping up all over the country in pharmacies, drugstores, and shopping centers. They're usually staffed by nurse practitioners or physician assistants that have been trained to treat common illnesses and complaints. They're usually fairly quick and inexpensive. However, if you have serious medical issues or chronic medical problems, these are probably not your best option.  No Primary Care Doctor: - Call Health Connect at  [phone] - they can help you locate a primary care doctor that  accepts your insurance, provides certain services, etc. - Physician Referral Service- [phone]  Chronic Pain Problems: Organization         Address  Phone   Notes  [facility]  [phone] Patients need to be referred by their primary care doctor.   Medication Assistance: Organization         Address  Phone   Notes  [facility] Medication [facility] [address] [place], [place] [address] [phone] --Must be a resident of [facility] -- Must have NO insurance coverage whatsoever (no Medicaid/ Medicare, etc.) -- The pt. MUST have a primary care doctor that directs their care regularly and follows them in the community   MedAssist  [phone]    [employer]  [phone]    Agencies that provide inexpensive medical care: Organization         Address  Phone   Notes  [place]  [phone]   [facility]    [phone]   [facility] [place], [place] [address] [phone]   [place] [address] [place] [phone]   Planned Parenthood    [phone]   [facility]    [phone]   [place] and [address], [place] Phone:  [phone], Fax:  [phone] Hours of Operation:  9 am - 6 pm, M-F.  Also accepts Medicaid/Medicare and self-pay.  [facility] for [address] [place], [address], [place] Phone: [phone], Fax: [phone]. Hours of Operation:  8:30 am - 5:30 pm, M-F.  Also accepts Medicaid and self-pay.  [facility] [place] [address] [place] Phone: [phone]   [place], [place], [place] [phone], Ext.  123 Mondays & Thursdays: 7-9 AM.  First 15 patients are seen on a first come, first serve basis.    [place] Providers:  Organization         Address  Phone   Notes  [facility] [address], [place] [phone] Also accepts self-pay patients.  [facility] [address], [place]  [phone]   [place], [address], [place] [phone]   [facility] [address], [place] [phone]   Lucianne Lei [address], [place]   [phone] Only accepts [place] Access [place] patients after they have their name applied to their card.   Self-Pay (no insurance) in [facility]:  Organization         Address  Phone   Notes  Sickle Cell Patients, [facility] [place] [phone]   [facility] [place] [phone]   [facility] [place]  [place], [place], [place] [phone]   [facility]/Dr. Osei-Bonsu  [address] [place] or [address], [place] [phone] Phone number for both [place] and [place] locations is the same.  [facility]gent [facility] and [facility] [address], [place] [phone]   [facility] [address], [place] or [address] [phone]   [facility] [address] [place] [phone], phone; [phone], fax Sees patients 1st and 3rd Saturday of every month.  Must not qualify for public or private insurance (i.e. Medicaid, Medicare, [place] Health Choice, Veterans' Benefits)  Household income should be no more than 200% of the poverty level The clinic cannot treat you if you are pregnant or think you are pregnant  Sexually transmitted diseases are not treated at the clinic.    Dental Care: Organization         Address  Phone  Notes  [facility] Department of [facility] [place] [phone] Accepts children up to age 73 who are enrolled in [place] or [place]; pregnant women with a Medicaid card; and children who have applied for Medicaid or [place] Health Choice, but were declined, whose parents can pay a reduced fee at time of service.  [facility] Department of [facility]  [address], [place] [phone] Accepts children up to age 63 who are enrolled in [place] or [place]; pregnant women with a Medicaid card; and children who have applied for Medicaid or [place] Health Choice, but were declined, whose parents can pay a reduced fee at time of service.  [place] Adult Dental Access PROGRAM  [place] [phone] Patients are seen by appointment only. Walk-ins are not accepted. [place] will see patients 83 years of age and older. Monday - Tuesday (8am-5pm) Most Wednesdays (8:30-5pm) $30 per visit, cash only  [facility] Adult Dental Access PROGRAM  [address], [facility] [phone] Patients are seen by appointment only. Walk-ins are not accepted. [place] will see patients 29 years of age and older. One Wednesday Evening (Monthly: Volunteer Based).  $30 per visit, cash only  [place]  [phone] for  adults; Children under age 44, call Graduate Pediatric Dentistry at [phone]. Children aged 59-14, please call [phone] to request a pediatric application.  Dental services are provided in all areas of dental care including fillings, crowns and bridges, complete and partial dentures, implants, gum treatment, root canals, and extractions. Preventive care is also provided. Treatment is provided to both adults and children. Patients are selected via a lottery and there is often a waiting list.   [facility] [address], [place]  [phone] [email or web address]   [facility] [address] [place], [place] [phone], Ext. [phone] Second and Fourth Thursday of each month, opens at 6:30 AM; Clinic ends at 9 AM.  Patients are seen on a first-come first-served basis, and a limited number are seen during each clinic.   [facility]  [address] [place], [place] [phone]   Eligibility Requirements You must have lived in [place], [place], or [place] counties for at least the last three months.   You cannot be eligible for state or federal sponsored [employer], including [employer], [place], or [employer].   You generally cannot be eligible for healthcare insurance through your employer.    How to apply: Eligibility screenings are held every Tuesday and Wednesday afternoon  from 1:00 pm until 4:00 pm. You do not need an appointment for the interview!  [facility] [address] [place], Campus   [place]  [place] Department  [place]  [phone]    Behavioral Health Resources in the Community: Intensive Outpatient Programs Organization         Address  Phone  Notes  [place]. [address], [place], [place] [phone]   [facility] [address], [place], [address]   ADS: Alcohol & Drug Svcs [address], [place], [place]   [place] [address],  [place], [place] or [phone]   Substance Abuse Resources Organization         Address  Phone  Notes  Alcohol and Drug Services  [phone]   [place]  [phone]   [facility] [place]   Chinita Pester  [phone]   Residential & Outpatient Substance Abuse Program  [phone]   Psychological Services Organization         Address  Phone  Notes  [facility] [place]  [place]  [phone]   [place] [address] [place] or [phone]    Mobile Crisis Teams Organization         Address  Phone  Notes  [facility], Mobile Crisis Care Unit  [phone]   [facility]otherapeutic [facility]  [address] [place], [place]   [place] [address], [place] [phone]    Self-Help/Support Groups Organization         Address  Phone             Notes  [place]. of [place] - variety of support groups  [place] Call for more information  Narcotics Anonymous (NA), [facility] [address], [employer] [place]  2 meetings at this location   Cytogeneticist  Notes  ASAP  Residential Treatment [address],    [place]  [phone]   [facility]  [address] [place] [phone], [place], [place]   [place], [place] [phone] Admissions: 8am-3pm M-F  Incentives Substance [place] [address]    [place], [place] [phone]   [facility] [address] [place], [place], [place]   [facility] [address],  [place], [place]   [facility] Programs - Intensive Outpatient [address] [place]een [address], [place], [place]   [facility] ([place].,  [place], [place] [phone] or [phone]   [facility]idential Treatment Services (RTS) [address] [place], [place] Accepts Medicaid  [place] [address],  [place] [phone] Substance Abuse/Addiction Treatment   [facility] Organization         Address  Phone  Notes  [facility]  [phone]   Domenic Schwab, PhD [address] Arlis Porta [place], [place]   [phone] or [phone]   [place] [address] [place], [place] [phone]   [facility] [address] [place], [place] [phone] Insurance/Medicaid/sponsorship through [facility] Families [address] [place]                                    [place], [place] [phone] [place] [address], [place] [phone]    Dr. Adele Schilder  [phone]   [facility] Dept. 1) [address], [place] 2) [place] 3)  [address], [place] [phone]  [phone]   [place] [phone] or [phone] (After Hours)

## 2015-03-09 NOTE — ED Notes (Signed)
Pt. Reports nausea is better, pt. Given ice chips.

## 2015-03-09 NOTE — ED Notes (Signed)
Pt c/o right upper quad abd pain with nausea/vomiting that started 24 hours ago, pt reports that she had been drinking "heavy" last week, has quit for over 6 months until last week,

## 2015-03-09 NOTE — ED Notes (Signed)
Dr. Glick at bedside.  

## 2015-03-09 NOTE — ED Provider Notes (Signed)
CSN: [id number]     Arrival date & time 03/09/15  0320 History   First MD Initiated Contact with Patient 03/09/15 [phone]     Chief Complaint  Patient presents with  . Abdominal Pain     (Consider location/radiation/quality/duration/timing/severity/associated sxs/prior Treatment) Patient is a 39 y.o. female presenting with abdominal pain. The history is provided by the patient.  Abdominal Pain She  has a history of alcohol abuse. 6 days ago, she started drinking after being sober for about 6 months. She admits to drinking approximately half gallon of vodka a day. Yesterday, she started having right upper quadrant pain with radiation to the back. There is associated nausea and vomiting. Emesis has been green. She denies any blood in her emesis. She is also been having some diarrhea. She rates pain at 9/10. She denies fever, chills, sweats. She denies chest pain. She has not had any medication at home. Last drink was approximately 30 hours ago.  Past Medical History  Diagnosis Date  . Gastric ulcer   . Back injury   . Hepatitis C 10/14/2013  . Gastric ulcer 10/14/2013   Past Surgical History  Procedure Laterality Date  . Back surgery    . Tonsillectomy    . Breast surgery    . Esophagogastroduodenoscopy (egd) with propofol N/A 10/14/2013    Procedure: ESOPHAGOGASTRODUODENOSCOPY (EGD) WITH PROPOFOL;  Surgeon: Rogene Houston, MD;  Location: [facility];  Service: Endoscopy;  Laterality: N/A;   Family History  Problem Relation Age of Onset  . Cancer Mother 44    ovarian   . Hypertension Mother   . Early death Father   . Alcohol abuse Father    Social History  Substance Use Topics  . Smoking status: Current Every Day Smoker -- 1.00 packs/day for 20 years    Types: Cigarettes  . Smokeless tobacco: Never Used  . Alcohol Use: Yes     Comment: occ   OB History    No data available     Review of Systems  Gastrointestinal: Positive for abdominal pain.  All other systems reviewed and are  negative.     Allergies  Ibuprofen and Other  Home Medications   Prior to Admission medications   Medication Sig Start Date End Date Taking? Authorizing Provider  diphenhydrAMINE (BENADRYL) 25 mg capsule Take 50 mg by mouth every 6 (six) hours as needed for allergies or sleep.     Historical Provider, MD  HYDROcodone-acetaminophen (NORCO/VICODIN) 5-325 MG per tablet Take one-two tabs po q 4-6 hrs prn pain 08/03/14   Tammy Triplett, PA-C  ibuprofen (ADVIL,MOTRIN) 200 MG tablet Take 800 mg by mouth every 8 (eight) hours as needed (pain).    Historical Provider, MD  medroxyPROGESTERone (PROVERA) 10 MG tablet Take 1 tablet (10 mg total) by mouth daily. Patient not taking: Reported on 08/03/2014 06/13/14   Jonnie Kind, MD  pantoprazole (PROTONIX) 40 MG tablet Take 1 tablet (40 mg total) by mouth 2 (two) times daily before a meal. Patient not taking: Reported on 08/03/2014 10/15/13   Kathie Dike, MD   BP 171/107 mmHg  Pulse 91  Temp(Src) 98.1 F (36.7 C) (Oral)  Resp 18  Ht 5\' 7"  (1.702 m)  Wt 130 lb (58.968 kg)  BMI 20.36 kg/m2  SpO2 100%  LMP 02/26/2015 Physical Exam  Nursing note and vitals reviewed.  39 year old female, resting comfortably and in no acute distress. Vital signs are significant for hypertension. Oxygen saturation is 100%, which is normal. Head  is normocephalic and atraumatic. PERRLA, EOMI. Oropharynx is clear. Neck is nontender and supple without adenopathy or JVD. Back is nontender and there is no CVA tenderness. Lungs are clear without rales, wheezes, or rhonchi. Chest is nontender. Heart has regular rate and rhythm without murmur. Abdomen is soft, flat, with moderate right upper quadrant tenderness without masses or hepatosplenomegaly and peristalsis is normoactive. There is no rebound or guarding. Extremities have no cyanosis or edema, full range of motion is present. Skin is warm and dry without rash. Neurologic: Mental status is normal, cranial nerves  are intact, there are no motor or sensory deficits.   ED Course  Procedures (including critical care time) Labs Review Results for orders placed or performed during the hospital encounter of 03/09/15  Lipase, blood  Result Value Ref Range   Lipase 15 (L) 22 - 51 U/L  Comprehensive metabolic panel  Result Value Ref Range   Sodium 134 (L) 135 - 145 mmol/L   Potassium 3.4 (L) 3.5 - 5.1 mmol/L   Chloride 98 (L) 101 - 111 mmol/L   CO2 23 22 - 32 mmol/L   Glucose, Bld 95 65 - 99 mg/dL   BUN 9 6 - 20 mg/dL   Creatinine, Ser 0.59 0.44 - 1.00 mg/dL   Calcium 8.6 (L) 8.9 - 10.3 mg/dL   Total Protein 6.8 6.5 - 8.1 g/dL   Albumin 4.1 3.5 - 5.0 g/dL   AST 37 15 - 41 U/L   ALT 41 14 - 54 U/L   Alkaline Phosphatase 59 38 - 126 U/L   Total Bilirubin 1.5 (H) 0.3 - 1.2 mg/dL   GFR calc non Af Amer >60 >60 mL/min   GFR calc Af Amer >60 >60 mL/min   Anion gap 13 5 - 15  CBC  Result Value Ref Range   WBC 10.4 4.0 - 10.5 K/uL   RBC 4.12 3.87 - 5.11 MIL/uL   Hemoglobin 12.5 12.0 - 15.0 g/dL   HCT 36.7 36.0 - 46.0 %   MCV 89.1 78.0 - 100.0 fL   MCH 30.3 26.0 - 34.0 pg   MCHC 34.1 30.0 - 36.0 g/dL   RDW 12.8 11.5 - 15.5 %   Platelets 203 150 - 400 K/uL  Urinalysis, Routine w reflex microscopic (not at [facility])  Result Value Ref Range   Color, Urine YELLOW YELLOW   APPearance CLEAR CLEAR   Specific Gravity, Urine 1.010 1.005 - 1.030   pH 6.0 5.0 - 8.0   Glucose, UA NEGATIVE NEGATIVE mg/dL   Hgb urine dipstick NEGATIVE NEGATIVE   Bilirubin Urine NEGATIVE NEGATIVE   Ketones, ur 40 (A) NEGATIVE mg/dL   Protein, ur NEGATIVE NEGATIVE mg/dL   Urobilinogen, UA 0.2 0.0 - 1.0 mg/dL   Nitrite NEGATIVE NEGATIVE   Leukocytes, UA NEGATIVE NEGATIVE  Differential  Result Value Ref Range   Neutrophils Relative % 78 (H) 43 - 77 %   Neutro Abs 7.9 (H) 1.7 - 7.7 K/uL   Lymphocytes Relative 16 12 - 46 %   Lymphs Abs 1.6 0.7 - 4.0 K/uL   Monocytes Relative 6 3 - 12 %   Monocytes Absolute 0.6 0.1 - 1.0 K/uL    Eosinophils Relative 0 0 - 5 %   Eosinophils Absolute 0.0 0.0 - 0.7 K/uL   Basophils Relative 0 0 - 1 %   Basophils Absolute 0.0 0.0 - 0.1 K/uL   I, Tristian Sickinger, personally reviewed and evaluated these lab results as part of my medical decision-making.  MDM   Final diagnoses:  Alcoholic gastritis  Elevated bilirubin    Abdominal pain and vomiting brought on by alcohol drinking binge. Consider pancreatitis, gastritis, acute alcoholic hepatitis. Screening labs are obtained. She is given IV fluids, morphine, ondansetron, and pantoprazole. Old records are reviewed, and she has prior ED visits for alcohol abuse.  She had little relief of nausea with ondansetron. She was given a dose of metoclopramide with much better relief of nausea. Laboratory workup was amazingly unremarkable. transaminases were normal on aware as they have been about 10 times normal when she was last seen here 15 months ago. Bilirubin is mildly elevated of uncertain significance. Lipase is normal where it had been 3 times normal at her last visit. She is discharged with prescription for metoclopramide. She is advised to abstain from alcohol and she is to use over-the-counter H2 blockers and proton pump inhibitors as needed. Resource guide was given.  Delora Fuel, MD 52/48/18 5909

## 2015-04-28 ENCOUNTER — Emergency Department (HOSPITAL_COMMUNITY): Payer: Medicaid Other

## 2015-04-28 ENCOUNTER — Emergency Department (HOSPITAL_COMMUNITY)
Admission: EM | Admit: 2015-04-28 | Discharge: 2015-04-28 | Disposition: A | Discharge status: Home / Self Care | Payer: Medicaid Other | Attending: Emergency Medicine | Admitting: Emergency Medicine

## 2015-04-28 ENCOUNTER — Encounter (HOSPITAL_COMMUNITY): Payer: Self-pay | Admitting: Emergency Medicine

## 2015-04-28 DIAGNOSIS — F410 Panic disorder [episodic paroxysmal anxiety] without agoraphobia: Secondary | ICD-10-CM | POA: Insufficient documentation

## 2015-04-28 DIAGNOSIS — R112 Nausea with vomiting, unspecified: Secondary | ICD-10-CM | POA: Insufficient documentation

## 2015-04-28 DIAGNOSIS — Z8619 Personal history of other infectious and parasitic diseases: Secondary | ICD-10-CM | POA: Insufficient documentation

## 2015-04-28 DIAGNOSIS — R1013 Epigastric pain: Secondary | ICD-10-CM | POA: Insufficient documentation

## 2015-04-28 DIAGNOSIS — Z8719 Personal history of other diseases of the digestive system: Secondary | ICD-10-CM | POA: Insufficient documentation

## 2015-04-28 DIAGNOSIS — F419 Anxiety disorder, unspecified: Secondary | ICD-10-CM

## 2015-04-28 DIAGNOSIS — Z3202 Encounter for pregnancy test, result negative: Secondary | ICD-10-CM | POA: Insufficient documentation

## 2015-04-28 DIAGNOSIS — Z87828 Personal history of other (healed) physical injury and trauma: Secondary | ICD-10-CM | POA: Insufficient documentation

## 2015-04-28 DIAGNOSIS — Z720 Tobacco use: Secondary | ICD-10-CM | POA: Insufficient documentation

## 2015-04-28 HISTORY — DX: Panic disorder (episodic paroxysmal anxiety): F41.0

## 2015-04-28 LAB — COMPREHENSIVE METABOLIC PANEL
ALK PHOS: 69 U/L (ref 38–126)
ALT: 56 U/L — ABNORMAL HIGH (ref 14–54)
ANION GAP: 16 — AB (ref 5–15)
AST: 80 U/L — ABNORMAL HIGH (ref 15–41)
Albumin: 4.8 g/dL (ref 3.5–5.0)
BUN: 6 mg/dL (ref 6–20)
CALCIUM: 10 mg/dL (ref 8.9–10.3)
CO2: 24 mmol/L (ref 22–32)
Chloride: 98 mmol/L — ABNORMAL LOW (ref 101–111)
Creatinine, Ser: 0.79 mg/dL (ref 0.44–1.00)
GFR calc Af Amer: >60 mL/min (ref >60)
GFR calc non Af Amer: >60 mL/min (ref >60)
GLUCOSE: 105 mg/dL — AB (ref 65–99)
Potassium: 3.8 mmol/L (ref 3.5–5.1)
Sodium: 138 mmol/L (ref 135–145)
Total Bilirubin: 1.4 mg/dL — ABNORMAL HIGH (ref 0.3–1.2)
Total Protein: 8.2 g/dL — ABNORMAL HIGH (ref 6.5–8.1)

## 2015-04-28 LAB — PREGNANCY, URINE: Preg Test, Ur: NEGATIVE

## 2015-04-28 LAB — CBC
HCT: 42.7 % (ref 36.0–46.0)
Hemoglobin: 15.2 g/dL — ABNORMAL HIGH (ref 12.0–15.0)
MCH: 33.8 pg (ref 26.0–34.0)
MCHC: 35.6 g/dL (ref 30.0–36.0)
MCV: 94.9 fL (ref 78.0–100.0)
Platelets: 161 10*3/uL (ref 150–400)
RBC: 4.5 MIL/uL (ref 3.87–5.11)
RDW: 16.3 % — AB (ref 11.5–15.5)
WBC: 7.9 10*3/uL (ref 4.0–10.5)

## 2015-04-28 LAB — URINALYSIS, ROUTINE W REFLEX MICROSCOPIC
BILIRUBIN URINE: NEGATIVE
GLUCOSE, UA: NEGATIVE mg/dL
HGB URINE DIPSTICK: NEGATIVE
Ketones, ur: 40 mg/dL — AB
Leukocytes, UA: NEGATIVE
Nitrite: NEGATIVE
PH: 8 (ref 5.0–8.0)
Protein, ur: NEGATIVE mg/dL
SPECIFIC GRAVITY, URINE: 1.01 (ref 1.005–1.030)
Urobilinogen, UA: 0.2 mg/dL (ref 0.0–1.0)

## 2015-04-28 LAB — RAPID URINE DRUG SCREEN, HOSP PERFORMED
AMPHETAMINES: NOT DETECTED
BARBITURATES: NOT DETECTED
BENZODIAZEPINES: NOT DETECTED
COCAINE: NOT DETECTED
Opiates: NOT DETECTED
TETRAHYDROCANNABINOL: NOT DETECTED

## 2015-04-28 LAB — TROPONIN I

## 2015-04-28 LAB — ETHANOL: Alcohol, Ethyl (B): <5 mg/dL (ref <5)

## 2015-04-28 LAB — LIPASE, BLOOD: LIPASE: 42 U/L (ref 22–51)

## 2015-04-28 MED ORDER — PROMETHAZINE HCL 25 MG/ML IJ SOLN
12.5000 mg | Freq: Once | INTRAMUSCULAR | Status: AC
  Administered 2015-04-28: 12.5 mg via INTRAVENOUS
  Filled 2015-04-28: qty 1

## 2015-04-28 MED ORDER — SODIUM CHLORIDE 0.9 % IV SOLN
INTRAVENOUS | Status: DC
  Administered 2015-04-28: 16:00:00 via INTRAVENOUS

## 2015-04-28 MED ORDER — LORAZEPAM 2 MG/ML IJ SOLN
1.0000 mg | Freq: Once | INTRAMUSCULAR | Status: AC
  Administered 2015-04-28: 1 mg via INTRAVENOUS
  Filled 2015-04-28: qty 1

## 2015-04-28 MED ORDER — SODIUM CHLORIDE 0.9 % IV BOLUS (SEPSIS)
500.0000 mL | Freq: Once | INTRAVENOUS | Status: AC
  Administered 2015-04-28: 500 mL via INTRAVENOUS

## 2015-04-28 MED ORDER — PROMETHAZINE HCL 25 MG PO TABS: 25.0000 mg | ORAL_TABLET | Freq: Four times a day (QID) | ORAL | Status: DC | PRN

## 2015-04-28 MED ORDER — GI COCKTAIL ~~LOC~~
30.0000 mL | Freq: Once | ORAL | Status: AC
  Administered 2015-04-28: 30 mL via ORAL
  Filled 2015-04-28: qty 30

## 2015-04-28 MED ORDER — LORAZEPAM 1 MG PO TABS: 1.0000 mg | ORAL_TABLET | Freq: Two times a day (BID) | ORAL | Status: DC | PRN

## 2015-04-28 NOTE — ED Notes (Signed)
Pt c/o of central CP, SOB, and vomiting since 0300 this morning. Pt states, "it feels like an elephant is sitting on my chest." Pt states she thinks it is a panic attack.

## 2015-04-28 NOTE — Discharge Instructions (Signed)
°Emergency Department Resource Guide °1) Find a Doctor and Pay Out of Pocket °Although you won't have to find out who is covered by your insurance plan, it is a good idea to ask around and get recommendations. You will then need to call the office and see if the doctor you have chosen will accept you as a new patient and what types of options they offer for patients who are self-pay. Some doctors offer discounts or will set up payment plans for their patients who do not have insurance, but you will need to ask so you aren't surprised when you get to your appointment. ° °2) Contact Your Local Health Department °Not all health departments have doctors that can see patients for sick visits, but many do, so it is worth a call to see if yours does. If you don't know where your local health department is, you can check in your phone book. The CDC also has a tool to help you locate your state's health department, and many state websites also have listings of all of their local health departments. ° °3) Find a Walk-in Clinic °If your illness is not likely to be very severe or complicated, you may want to try a walk in clinic. These are popping up all over the country in pharmacies, drugstores, and shopping centers. They're usually staffed by nurse practitioners or physician assistants that have been trained to treat common illnesses and complaints. They're usually fairly quick and inexpensive. However, if you have serious medical issues or chronic medical problems, these are probably not your best option. ° °No Primary Care Doctor: °- Call [facility] at  832-8000 - they can help you locate a primary care doctor that  accepts your insurance, provides certain services, etc. °- Physician Referral Service- 1-800-533-3463 ° °Chronic Pain Problems: °Organization         Address  Phone   Notes  °[facility]  ([phone]) [phone] Patients need to be referred by their primary care doctor.  ° °Medication  Assistance: °Organization         Address  Phone   Notes  °[place] [facility] [address] °[place], [place] [address] (336) 641-8030 --Must be a resident of [place] °-- Must have NO insurance coverage whatsoever (no Medicaid/ Medicare, etc.) °-- The pt. MUST have a primary care doctor that directs their care regularly and follows them in the community °  °[facility]  (866) 331-1348   °[employer]  (888) 892-1162   ° °Agencies that provide inexpensive medical care: °Organization         Address  Phone   Notes  °[facility]  (336) 832-8035   °[facility]    (336) 832-7272   °[facility] [address] °[place], [place] [address] (336) 832-4777   °[facility] [address], °[place] (336) 271-4999   °[facility]    (336) 373-0678   °[facility]    (336) 272-1050   °[facility] ° [address], [place] Phone:  (336) 832-4444, Fax:  (336) 832-4440 Hours of Operation:  9 am - 6 pm, M-F.  Also accepts Medicaid/Medicare and self-pay.  °[facility] ° [address], [place] Phone: (336) [phone], Fax: (336) [phone]. Hours of Operation:  8:30 am - 5:30 pm, M-F.  Also accepts Medicaid and self-pay.  °[facility] [place] [address], [place] Phone: (336) 878-6027   °[facility] [address], [place], [place] (336)723-1848, Ext. 123 Mondays & Thursdays: 7-9 AM.  First 15 patients are seen on a first come, first serve basis. °  ° °Medicaid-accepting [place] Providers: ° °Organization         Address  Phone   Notes  °[facility] [address], [place] (336) 641-2100 Also accepts self-pay patients.  °[facility] [address], [place] ° (336) 856-9996   °[facility] [address], [place]  (336) 288-8857   °[facility] [address] [place] [address], [place] (336) 299-7000   °Veita Bland [address], [place]  ° (336) [phone] Only accepts [place] Access Medicaid patients after they have their name applied to their card.  ° °Self-Pay (no insurance) in [place]: ° °Organization         Address  Phone   Notes  °Sickle Cell Patients, [facility] [address], [place] (336) 832-1970   °[facility] [address], [place] (336) 832-4400   °[facility] ° [address], [place] (336) 992-4800   °[facility]/Dr. Osei-Bonsu ° [address] [place] [address], [place] or [address], [place] (336) [phone] Phone number for both [place] and [place] locations is the same.  °[facility] [address], [place] (336) [phone]   °[facility] [address] [place] [address], [place] or [address] (336) 852-7530 °(336) 878-2260   °[facility] [address], [place] (336) 350-1642, phone; (336) 294-5005, fax Sees patients 1st and 3rd Saturday of every month.  Must not qualify for public or private insurance (i.e. Medicaid, Medicare, [place] Health Choice, Veterans' Benefits) • Household income should be no more than 200% of the poverty level •The clinic cannot treat you if you are pregnant or think you are pregnant • Sexually transmitted diseases are not treated at the clinic.  ° ° °Dental Care: °Organization         Address  Phone  Notes  °[place] [facility] [address], [place] (336) 641-6152 Accepts children up to age 21 who are enrolled in Medicaid or [place] Health Choice; pregnant women with a Medicaid card; and children who have applied for Medicaid or [place] Health Choice, but were declined, whose parents can pay a reduced fee at time of service.  °[place]  [facility] [place]  [address], [place] (336) 641-7733 Accepts children up to age 21 who are enrolled in Medicaid or [place] Health Choice; pregnant women with a Medicaid card; and children who have applied for Medicaid or  Health Choice, but were declined, whose parents can pay a reduced fee at time of service.  °[facility] ° [address], [place] (336) 641-4533 Patients are seen by appointment only. Walk-ins are not accepted. [facility] will see patients 18 years of age and older. °Monday - Tuesday (8am-5pm) °Most Wednesdays (8:30-5pm) °$30 per visit, cash only  °[facility] ° [address], [place] (336) 641-4533 Patients are seen by appointment only. Walk-ins are not accepted. [facility] will see patients 18 years of age and older. °One   Wednesday Evening (Monthly: Volunteer Based).  $30 per visit, cash only  °[facility]  ([phone]) [phone] for adults; Children under age 4, call Graduate Pediatric Dentistry at ([phone]) [phone]. Children aged 4-14, please call ([phone]) [phone] to request a pediatric application. ° Dental services are provided in all areas of dental care including fillings, crowns and bridges, complete and partial dentures, implants, gum treatment, root canals, and extractions. Preventive care is also provided. Treatment is provided to both adults and children. °Patients are selected via a lottery and there is often a waiting list. °  °[facility] [address], °[place] ° (336) 763-8833 www.drcivils.com °  °[facility] [address], [place], [place] (336)723-1848, Ext. 123 Second and Fourth Thursday of each month, opens at 6:30 AM; Clinic ends at 9 AM.  Patients are seen on a first-come first-served basis, and a limited number are seen during each clinic.  ° °[facility] ° [address], [place], [place] (336) [phone]    Eligibility Requirements °You must have lived in [place], [place], or [place] counties for at least the last three months. °  You cannot be eligible for state or federal sponsored healthcare insurance, including Veterans Administration, Medicaid, or Medicare. °  You generally cannot be eligible for healthcare insurance through your employer.  °  How to apply: °Eligibility screenings are held every Tuesday and Wednesday afternoon from 1:00 pm until 4:00 pm. You do not need an appointment for the interview!  °[facility] [address], [place], [place] 336-631-2330   °[facility]  336-342-8273   °[facility]  336-703-3100   °[facility]  336-570-6415   ° °Behavioral Health Resources in the Community: °Intensive Outpatient Programs °Organization         Address  Phone  Notes  °[facility] [address], [place], [place] 336-878-6098   °[facility] [address], [place], [place] 336-832-9800   °[facility] [address], [place], [place] ° 336-882-2125   °[facility] [address],  °[place], [place] 1-800-853-5163 or 336-641-4981   °Substance Abuse Resources °Organization         Address  Phone  Notes  °[facility]  336-882-2125   °[facility]  336-784-9470   °[facility]  336-285-9073   °[facility]  336-845-3988   °Residential & Outpatient Substance Abuse Program  1-800-659-3381   °Psychological Services °Organization         Address  Phone  Notes  °[facility]  336- 832-9600   °[facility]  336- 378-7881   °[facility] [address], [place] 1-800-853-5163 or 336-641-4981   ° °Mobile Crisis Teams °Organization         Address  Phone  Notes  °[facility], Mobile Crisis Care Unit  1-877-626-1772   °Assertive °[facility] ° [address]  [place], [place] 336-834-9664   °Sharon DeEsch [address] °[place] 336-554-5454   ° °Self-Help/Support Groups °Organization         Address  Phone             Notes  °[facility] - variety of support groups  336- 373-1402 Call for more information  °Narcotics Anonymous (NA), [facility] [address], °[place]  2 meetings at this location  ° °  Residential Treatment Programs Organization         Address  Phone  Notes  [facility] [address] [place] [address],    [place]  1-(470)107-4810   [facility]  [address] [place] [address], [place], [place]   [place], [place] (732)[phone] Admissions: 8am-3pm M-F  [facility] [place] [address],    [place], [place] [phone]   [facility] [address] [place], [place], [place]   [facility] [address],  [place], [place]   [facility] - Intensive Outpatient [address], [place], [place]   [facility] ([place] [address]  [place], [place] 1-(781)509-2035 or 415-153-9482   [facility] [address] [place], [place] Accepts Medicaid  [facility] [address],  [place] 1-860-595-8740 Substance Abuse/Addiction Treatment   [facility]         Address  Phone  Notes  [facility]  707-504-2522   Domenic Schwab, PhD [address] [place], [place]   216-693-8826 or 760-177-0959   [place], [place] [phone]   [facility] [address] [place], [place] [phone] Insurance/Medicaid/sponsorship through [facility] [address]                                    [place], [place] [phone] [place] [address], [place] [phone]    Dr. Adele Schilder  ([phone]) [phone]   [facility] Dept. 1) [address] [place] 2) [place] 3)  [place] [address], [place] [phone]  [phone]   [place] [phone] or [phone] (After Hours)      Eat a bland diet, avoiding greasy, fatty, fried foods, as well as spicy and acidic foods or beverages.  Avoid eating within the hour or 2 before going to bed or laying down.  Also avoid teas, colas, coffee, chocolate, pepermint and spearment.  Take over the counter pepcid, one tablet by mouth twice a day, for the next 2 to 3 weeks.  May also take over the counter maalox/mylanta, as directed on packaging, as needed for discomfort.  Take the prescriptions as directed.  Call your regular medical doctor tomorrow to schedule a follow up appointment in the next 2 days. Call the GI doctor tomorrow to schedule a follow up appointment within the next week.  Return to the Emergency Department immediately if worsening.

## 2015-04-28 NOTE — ED Provider Notes (Signed)
CSN: [id number]     Arrival date & time 04/28/15  1428 History   First MD Initiated Contact with Patient 04/28/15 1434     Chief Complaint  Patient presents with  . Chest Pain  . Panic Attack      HPI Pt was seen at 1435.  Per pt, c/o gradual onset and persistence of constant generalized chest "pain" since 0300 this morning.  Has been associated with multiple intermittent episodes of N/V and anxiety/panic attack.  Describes the chest pain as "pressure" and "burning."  Denies diarrhea, no fevers, no back pain, no rash, no palpitations, no black or blood in stools or emesis.      GI: Rehman Past Medical History  Diagnosis Date  . Gastric ulcer   . Back injury   . Hepatitis C 10/14/2013  . Gastric ulcer 10/14/2013  . Panic attack    Past Surgical History  Procedure Laterality Date  . Back surgery    . Tonsillectomy    . Breast surgery    . Esophagogastroduodenoscopy (egd) with propofol N/A 10/14/2013    Procedure: ESOPHAGOGASTRODUODENOSCOPY (EGD) WITH PROPOFOL;  Surgeon: Rogene Houston, MD;  Location: [facility];  Service: Endoscopy;  Laterality: N/A;   Family History  Problem Relation Age of Onset  . Cancer Mother 41    ovarian   . Hypertension Mother   . Early death Father   . Alcohol abuse Father    Social History  Substance Use Topics  . Smoking status: Current Every Day Smoker -- 1.00 packs/day for 20 years    Types: Cigarettes  . Smokeless tobacco: Never Used  . Alcohol Use: Yes     Comment: occ    Review of Systems ROS: Statement: All systems negative except as marked or noted in the HPI; Constitutional: Negative for fever and chills. ; ; Eyes: Negative for eye pain, redness and discharge. ; ; ENMT: Negative for ear pain, hoarseness, nasal congestion, sinus pressure and sore throat. ; ; Cardiovascular: +CP. Negative for palpitations, diaphoresis, dyspnea and peripheral edema. ; ; Respiratory: Negative for cough, wheezing and stridor. ; ; Gastrointestinal: +N/V. Negative  for diarrhea, abdominal pain, blood in stool, hematemesis, jaundice and rectal bleeding. . ; ; Genitourinary: Negative for dysuria, flank pain and hematuria. ; ; Musculoskeletal: Negative for back pain and neck pain. Negative for swelling and trauma.; ; Skin: Negative for pruritus, rash, abrasions, blisters, bruising and skin lesion.; ; Neuro: Negative for headache, lightheadedness and neck stiffness. Negative for weakness, altered level of consciousness , altered mental status, extremity weakness, paresthesias, involuntary movement, seizure and syncope.; Psych:  +"panic attack." No SI, no SA, no HI, no hallucinations.    Allergies  Ibuprofen and Other  Home Medications   Prior to Admission medications   Medication Sig Start Date End Date Taking? Authorizing Provider  diphenhydrAMINE (BENADRYL) 25 mg capsule Take 50 mg by mouth every 6 (six) hours as needed for allergies or sleep.     Historical Provider, MD  HYDROcodone-acetaminophen (NORCO/VICODIN) 5-325 MG per tablet Take one-two tabs po q 4-6 hrs prn pain Patient not taking: Reported on 04/28/2015 08/03/14   Tammy Triplett, PA-C  ibuprofen (ADVIL,MOTRIN) 200 MG tablet Take 800 mg by mouth every 8 (eight) hours as needed (pain).    Historical Provider, MD  medroxyPROGESTERone (PROVERA) 10 MG tablet Take 1 tablet (10 mg total) by mouth daily. Patient not taking: Reported on 08/03/2014 06/13/14   Jonnie Kind, MD  metoCLOPramide (REGLAN) 10 MG tablet Take 1  tablet (10 mg total) by mouth every 6 (six) hours as needed for nausea. Patient not taking: Reported on 04/28/2015 6/43/32   Delora Fuel, MD  pantoprazole (PROTONIX) 40 MG tablet Take 1 tablet (40 mg total) by mouth 2 (two) times daily before a meal. Patient not taking: Reported on 08/03/2014 10/15/13   Kathie Dike, MD   BP 177/109 mmHg  Pulse 107  Temp(Src) 97.4 F (36.3 C) (Oral)  Resp 30  Ht 5\' 7"  (1.702 m)  Wt 130 lb (58.968 kg)  BMI 20.36 kg/m2  SpO2 99%  LMP  04/27/2015 Physical Exam  1440: Physical examination:  Nursing notes reviewed; Vital signs and O2 SAT reviewed;  Constitutional: Well developed, Well nourished, Well hydrated, Anxious, trembling.; Head:  Normocephalic, atraumatic; Eyes: EOMI, PERRL, No scleral icterus; ENMT: Mouth and pharynx normal, Mucous membranes moist; Neck: Supple, Full range of motion, No lymphadenopathy; Cardiovascular: Tachycardic rate and rhythm, No gallop; Respiratory: Breath sounds clear & equal bilaterally, No wheezes. Intermittently hyperventilating during exam. Speaking full sentences with ease, Normal respiratory effort/excursion; Chest: Nontender, Movement normal; Abdomen: Soft, +mild mid-epigastric and LUQ tenderness to palp. No rebound or guarding. Nondistended, Normal bowel sounds; Genitourinary: No CVA tenderness; Extremities: Pulses normal, No tenderness, No edema, No calf edema or asymmetry.; Neuro: AA&Ox3, Major CN grossly intact.  Speech clear. No gross focal motor or sensory deficits in extremities.; Skin: Color normal, Warm, Dry.; Psych:  Anxious and trembling.   ED Course  Procedures (including critical care time) Labs Review   Imaging Review  I have personally reviewed and evaluated these images and lab results as part of my medical decision-making.   EKG Interpretation   Date/Time:  Tuesday April 28 2015 14:42:36 EDT Ventricular Rate:  98 PR Interval:  137 QRS Duration: 90 QT Interval:  385 QTC Calculation: 492 R Axis:   54 Text Interpretation:  Sinus rhythm Borderline prolonged QT interval  Baseline wander Artifact When compared with ECG of 10/12/2013 QT has  lengthened Otherwise no significant change Confirmed by [facility]  MD,  Nunzio Cory [phone]) on 04/28/2015 3:07:32 PM      MDM  MDM Reviewed: previous chart, nursing note and vitals Reviewed previous: labs and ECG Interpretation: labs, ECG and x-ray     Results for orders placed or performed during the hospital encounter of  04/28/15  CBC  Result Value Ref Range   WBC 7.9 4.0 - 10.5 K/uL   RBC 4.50 3.87 - 5.11 MIL/uL   Hemoglobin 15.2 (H) 12.0 - 15.0 g/dL   HCT 42.7 36.0 - 46.0 %   MCV 94.9 78.0 - 100.0 fL   MCH 33.8 26.0 - 34.0 pg   MCHC 35.6 30.0 - 36.0 g/dL   RDW 16.3 (H) 11.5 - 15.5 %   Platelets 161 150 - 400 K/uL  Troponin I  Result Value Ref Range   Troponin I <0.03 <0.031 ng/mL  Lipase, blood  Result Value Ref Range   Lipase 42 22 - 51 U/L  Comprehensive metabolic panel  Result Value Ref Range   Sodium 138 135 - 145 mmol/L   Potassium 3.8 3.5 - 5.1 mmol/L   [place] 98 (L) 101 - 111 mmol/L   CO2 24 22 - 32 mmol/L   Glucose, Bld 105 (H) 65 - 99 mg/dL   BUN 6 6 - 20 mg/dL   Creatinine, Ser 0.79 0.44 - 1.00 mg/dL   Calcium 10.0 8.9 - 10.3 mg/dL   Total Protein 8.2 (H) 6.5 - 8.1 g/dL   Albumin 4.8  3.5 - 5.0 g/dL   AST 80 (H) 15 - 41 U/L   ALT 56 (H) 14 - 54 U/L   Alkaline Phosphatase 69 38 - 126 U/L   Total Bilirubin 1.4 (H) 0.3 - 1.2 mg/dL   GFR calc non Af Amer >60 >60 mL/min   GFR calc Af Amer >60 >60 mL/min   Anion gap 16 (H) 5 - 15  Ethanol  Result Value Ref Range   Alcohol, Ethyl (B) <5 <5 mg/dL  Urine rapid drug screen (hosp performed)  Result Value Ref Range   Opiates NONE DETECTED NONE DETECTED   Cocaine NONE DETECTED NONE DETECTED   Benzodiazepines NONE DETECTED NONE DETECTED   Amphetamines NONE DETECTED NONE DETECTED   Tetrahydrocannabinol NONE DETECTED NONE DETECTED   Barbiturates NONE DETECTED NONE DETECTED  Pregnancy, urine  Result Value Ref Range   Preg Test, Ur NEGATIVE NEGATIVE  Urinalysis, Routine w reflex microscopic  Result Value Ref Range   Color, Urine YELLOW YELLOW   APPearance CLEAR CLEAR   Specific Gravity, Urine 1.010 1.005 - 1.030   pH 8.0 5.0 - 8.0   Glucose, UA NEGATIVE NEGATIVE mg/dL   Hgb urine dipstick NEGATIVE NEGATIVE   Bilirubin Urine NEGATIVE NEGATIVE   Ketones, ur 40 (A) NEGATIVE mg/dL   Protein, ur NEGATIVE NEGATIVE mg/dL    Urobilinogen, UA 0.2 0.0 - 1.0 mg/dL   Nitrite NEGATIVE NEGATIVE   Leukocytes, UA NEGATIVE NEGATIVE   Dg Chest Portable 1 View 04/28/2015   CLINICAL DATA:  The mid chest pain, shortness of breath and vomiting.  EXAM: PORTABLE CHEST 1 VIEW  COMPARISON:  Chest and rib films on 08/03/2014 and prior chest x-ray on 10/12/2013.  FINDINGS: The heart size and mediastinal contours are within normal limits. There is no evidence of pulmonary edema, consolidation, pneumothorax, nodule or pleural fluid. The visualized skeletal structures are unremarkable.  IMPRESSION: No active disease.   Electronically Signed   By: Aletta Edouard M.D.   On: 04/28/2015 14:56    Results for JAMAIRA, SHERK (MRN [id number]) as of 04/28/2015 17:47  Ref. Range 10/14/2013 05:58 11/21/2013 21:37 12/07/2013 01:33 03/09/2015 04:33 04/28/2015 14:59  AST Latest Ref Range: 15-41 U/L 142 (H) 100 (H) 365 (H) 37 80 (H)  ALT Latest Ref Range: 14-54 U/L 94 (H) 77 (H) 284 (H) 41 56 (H)  Total Bilirubin Latest Ref Range: 0.3-1.2 mg/dL 0.5 <0.2 (L) 0.5 1.5 (H) 1.4 (H)     1740:  Doubt PE as cause for symptoms with low risk [place].  Doubt ACS as cause for symptoms with normal troponin and EKG without new STTW changes compared to previous after 12+ hours of constant symptoms. Pt has tol PO well while in the ED without N/V.  No stooling while in the ED.  Abd benign, VSS. Pt no longer shaking or tearful. States she feels better and wants to go home now. LFT's mildly elevated compared to previous; and pt now endorses she has "started up drinking again" for the past few weeks after 6+ months of sobriety. No outward signs of withdrawal. Pt requesting rx phenergan for nausea and ativan for anxiety/panic attacks; aware she will be rx limited supply. Dx and testing d/w pt and family.  Questions answered.  Verb understanding, agreeable to d/c home with outpt f/u.    Francine Graven, DO 05/01/15 1655

## 2015-04-28 NOTE — ED Notes (Signed)
Pt instructed to follow up with her doctor about high bp.  Pt verbalized understanding.

## 2015-06-07 ENCOUNTER — Encounter (HOSPITAL_COMMUNITY): Payer: Self-pay | Admitting: Emergency Medicine

## 2015-06-07 ENCOUNTER — Emergency Department (HOSPITAL_COMMUNITY): Payer: Medicaid Other

## 2015-06-07 ENCOUNTER — Emergency Department (HOSPITAL_COMMUNITY)
Admission: EM | Admit: 2015-06-07 | Discharge: 2015-06-07 | Disposition: A | Discharge status: Home / Self Care | Payer: Medicaid Other | Attending: Emergency Medicine | Admitting: Emergency Medicine

## 2015-06-07 DIAGNOSIS — T22112A Burn of first degree of left forearm, initial encounter: Secondary | ICD-10-CM | POA: Insufficient documentation

## 2015-06-07 DIAGNOSIS — F410 Panic disorder [episodic paroxysmal anxiety] without agoraphobia: Secondary | ICD-10-CM | POA: Insufficient documentation

## 2015-06-07 DIAGNOSIS — Z8619 Personal history of other infectious and parasitic diseases: Secondary | ICD-10-CM | POA: Insufficient documentation

## 2015-06-07 DIAGNOSIS — F17210 Nicotine dependence, cigarettes, uncomplicated: Secondary | ICD-10-CM | POA: Insufficient documentation

## 2015-06-07 DIAGNOSIS — T22111A Burn of first degree of right forearm, initial encounter: Secondary | ICD-10-CM | POA: Insufficient documentation

## 2015-06-07 DIAGNOSIS — W5542XA Struck by pig, initial encounter: Secondary | ICD-10-CM | POA: Insufficient documentation

## 2015-06-07 DIAGNOSIS — Y9389 Activity, other specified: Secondary | ICD-10-CM | POA: Insufficient documentation

## 2015-06-07 DIAGNOSIS — Y998 Other external cause status: Secondary | ICD-10-CM | POA: Insufficient documentation

## 2015-06-07 DIAGNOSIS — Z8719 Personal history of other diseases of the digestive system: Secondary | ICD-10-CM | POA: Insufficient documentation

## 2015-06-07 DIAGNOSIS — S9001XA Contusion of right ankle, initial encounter: Secondary | ICD-10-CM | POA: Insufficient documentation

## 2015-06-07 DIAGNOSIS — Y92009 Unspecified place in unspecified non-institutional (private) residence as the place of occurrence of the external cause: Secondary | ICD-10-CM | POA: Insufficient documentation

## 2015-06-07 DIAGNOSIS — S9031XA Contusion of right foot, initial encounter: Secondary | ICD-10-CM | POA: Insufficient documentation

## 2015-06-07 MED ORDER — IBUPROFEN 800 MG PO TABS
800.0000 mg | ORAL_TABLET | Freq: Once | ORAL | Status: DC
  Filled 2015-06-07: qty 1

## 2015-06-07 MED ORDER — KETOROLAC TROMETHAMINE 60 MG/2ML IM SOLN
30.0000 mg | Freq: Once | INTRAMUSCULAR | Status: AC
  Administered 2015-06-07: 30 mg via INTRAMUSCULAR
  Filled 2015-06-07: qty 2
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
## 2015-06-07 NOTE — Discharge Instructions (Signed)
Tylenol or motrin for pain - keep foot elevated as much as possible - you may walk on the foot - no fractures on xray

## 2017-08-30 ENCOUNTER — Encounter: Payer: Self-pay | Admitting: Obstetrics and Gynecology

## 2017-08-30 ENCOUNTER — Other Ambulatory Visit (HOSPITAL_COMMUNITY)
Admission: RE | Admit: 2017-08-30 | Discharge: 2017-08-30 | Disposition: A | Discharge status: Home / Self Care | Payer: Self-pay | Source: Ambulatory Visit | Attending: Obstetrics and Gynecology | Admitting: Obstetrics and Gynecology

## 2017-08-30 ENCOUNTER — Ambulatory Visit (INDEPENDENT_AMBULATORY_CARE_PROVIDER_SITE_OTHER): Payer: Medicaid Other | Admitting: Obstetrics and Gynecology

## 2017-08-30 VITALS — BP 130/62 | HR 80 | Ht 66.5 in | Wt 106.2 lb

## 2017-08-30 DIAGNOSIS — Z01419 Encounter for gynecological examination (general) (routine) without abnormal findings: Secondary | ICD-10-CM | POA: Insufficient documentation

## 2017-08-30 DIAGNOSIS — R634 Abnormal weight loss: Secondary | ICD-10-CM

## 2017-08-30 DIAGNOSIS — Z3009 Encounter for other general counseling and advice on contraception: Secondary | ICD-10-CM

## 2017-08-30 DIAGNOSIS — Z309 Encounter for contraceptive management, unspecified: Secondary | ICD-10-CM

## 2017-08-30 DIAGNOSIS — Z113 Encounter for screening for infections with a predominantly sexual mode of transmission: Secondary | ICD-10-CM

## 2017-08-30 MED ORDER — ACETAMINOPHEN-CODEINE #3 300-30 MG PO TABS: 2.0000 | ORAL_TABLET | ORAL | 0 refills | Status: DC | PRN
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
## 2017-08-31 LAB — CYTOLOGY - PAP
Chlamydia: NEGATIVE
Diagnosis: NEGATIVE
HPV: NOT DETECTED
NEISSERIA GONORRHEA: NEGATIVE

## 2017-08-31 LAB — COMPREHENSIVE METABOLIC PANEL
A/G RATIO: 1.8 (ref 1.2–2.2)
ALT: 23 IU/L (ref 0–32)
AST: 18 IU/L (ref 0–40)
Albumin: 4.7 g/dL (ref 3.5–5.5)
Alkaline Phosphatase: 49 IU/L (ref 39–117)
BILIRUBIN TOTAL: 0.2 mg/dL (ref 0.0–1.2)
BUN/Creatinine Ratio: 12 (ref 9–23)
BUN: 8 mg/dL (ref 6–24)
CO2: 24 mmol/L (ref 20–29)
Calcium: 10.1 mg/dL (ref 8.7–10.2)
Chloride: 103 mmol/L (ref 96–106)
Creatinine, Ser: 0.67 mg/dL (ref 0.57–1.00)
GFR calc Af Amer: 126 mL/min/{1.73_m2} (ref >59)
GFR calc non Af Amer: 110 mL/min/{1.73_m2} (ref >59)
Globulin, Total: 2.6 g/dL (ref 1.5–4.5)
Glucose: 100 mg/dL — ABNORMAL HIGH (ref 65–99)
POTASSIUM: 4.4 mmol/L (ref 3.5–5.2)
Sodium: 141 mmol/L (ref 134–144)
Total Protein: 7.3 g/dL (ref 6.0–8.5)

## 2017-08-31 LAB — TSH: TSH: 0.565 u[IU]/mL (ref 0.450–4.500)

## 2017-08-31 LAB — RPR: RPR: NONREACTIVE

## 2017-08-31 LAB — HIV ANTIBODY (ROUTINE TESTING W REFLEX): HIV Screen 4th Generation wRfx: NONREACTIVE

## 2017-09-04 ENCOUNTER — Telehealth: Payer: Self-pay | Admitting: Obstetrics and Gynecology

## 2017-09-04 NOTE — Telephone Encounter (Signed)
Laboratory results reviewed with patient.  Thyroid function test normal HIV and RPR negative.  The patient reports that she received treatment for hepatitis C while in prison and tested negative x2 prior to departure from prison Patient advised that if weight loss continues to seek care promptly thank you

## 2017-12-22 ENCOUNTER — Encounter (HOSPITAL_COMMUNITY): Payer: Self-pay | Admitting: *Deleted

## 2017-12-22 ENCOUNTER — Other Ambulatory Visit: Payer: Self-pay

## 2017-12-22 ENCOUNTER — Emergency Department (HOSPITAL_COMMUNITY)
Admission: EM | Admit: 2017-12-22 | Discharge: 2017-12-23 | Disposition: A | Discharge status: Home / Self Care | Payer: Medicaid Other | Attending: Emergency Medicine | Admitting: Emergency Medicine

## 2017-12-22 DIAGNOSIS — R634 Abnormal weight loss: Secondary | ICD-10-CM | POA: Insufficient documentation

## 2017-12-22 DIAGNOSIS — K0889 Other specified disorders of teeth and supporting structures: Secondary | ICD-10-CM | POA: Insufficient documentation

## 2017-12-22 DIAGNOSIS — R1013 Epigastric pain: Secondary | ICD-10-CM

## 2017-12-22 LAB — URINALYSIS, ROUTINE W REFLEX MICROSCOPIC
Bacteria, UA: NONE SEEN
Bilirubin Urine: NEGATIVE
Glucose, UA: NEGATIVE mg/dL
Ketones, ur: NEGATIVE mg/dL
Leukocytes, UA: NEGATIVE
Nitrite: NEGATIVE
Protein, ur: NEGATIVE mg/dL
Specific Gravity, Urine: 1.005 (ref 1.005–1.030)
pH: 6 (ref 5.0–8.0)

## 2017-12-22 LAB — CBC WITH DIFFERENTIAL/PLATELET
Basophils Absolute: 0 10*3/uL (ref 0.0–0.1)
Basophils Relative: 0 %
Eosinophils Absolute: 0.1 10*3/uL (ref 0.0–0.7)
Eosinophils Relative: 0 %
HCT: 39.8 % (ref 36.0–46.0)
Hemoglobin: 13.3 g/dL (ref 12.0–15.0)
Lymphocytes Relative: 21 %
Lymphs Abs: 2.7 10*3/uL (ref 0.7–4.0)
MCH: 31.1 pg (ref 26.0–34.0)
MCHC: 33.4 g/dL (ref 30.0–36.0)
MCV: 93.2 fL (ref 78.0–100.0)
Monocytes Absolute: 0.6 10*3/uL (ref 0.1–1.0)
Monocytes Relative: 4 %
Neutro Abs: 9.6 10*3/uL — ABNORMAL HIGH (ref 1.7–7.7)
Neutrophils Relative %: 75 %
Platelets: 194 10*3/uL (ref 150–400)
RBC: 4.27 MIL/uL (ref 3.87–5.11)
RDW: 13.9 % (ref 11.5–15.5)
WBC: 12.9 10*3/uL — ABNORMAL HIGH (ref 4.0–10.5)

## 2017-12-22 LAB — COMPREHENSIVE METABOLIC PANEL
ALT: 24 U/L (ref 14–54)
AST: 26 U/L (ref 15–41)
Albumin: 4.4 g/dL (ref 3.5–5.0)
Alkaline Phosphatase: 40 U/L (ref 38–126)
Anion gap: 10 (ref 5–15)
BUN: 11 mg/dL (ref 6–20)
CO2: 26 mmol/L (ref 22–32)
Calcium: 9.2 mg/dL (ref 8.9–10.3)
Chloride: 104 mmol/L (ref 101–111)
Creatinine, Ser: 0.52 mg/dL (ref 0.44–1.00)
GFR calc Af Amer: >60 mL/min (ref >60)
GFR calc non Af Amer: >60 mL/min (ref >60)
Glucose, Bld: 68 mg/dL (ref 65–99)
Potassium: 3.2 mmol/L — ABNORMAL LOW (ref 3.5–5.1)
Sodium: 140 mmol/L (ref 135–145)
Total Bilirubin: 1 mg/dL (ref 0.3–1.2)
Total Protein: 7.2 g/dL (ref 6.5–8.1)

## 2017-12-22 LAB — LIPASE, BLOOD: Lipase: 20 U/L (ref 11–51)

## 2017-12-22 MED ORDER — CEFTRIAXONE SODIUM 1 G IJ SOLR
1.0000 g | Freq: Once | INTRAMUSCULAR | Status: AC
  Administered 2017-12-22: 1 g via INTRAMUSCULAR
  Filled 2017-12-22: qty 10

## 2017-12-22 MED ORDER — OXYCODONE HCL 5 MG PO TABS
5.0000 mg | ORAL_TABLET | Freq: Once | ORAL | Status: DC
  Filled 2017-12-22: qty 1

## 2017-12-22 MED ORDER — LIDOCAINE HCL (PF) 1 % IJ SOLN
INTRAMUSCULAR | Status: AC
  Administered 2017-12-22: 2 mL
  Filled 2017-12-22: qty 2

## 2017-12-22 MED ORDER — PENICILLIN V POTASSIUM 250 MG PO TABS
500.0000 mg | ORAL_TABLET | Freq: Once | ORAL | Status: DC
  Filled 2017-12-22: qty 2

## 2017-12-22 MED ORDER — MORPHINE SULFATE (PF) 4 MG/ML IV SOLN
4.0000 mg | Freq: Once | INTRAVENOUS | Status: AC
Start: 2017-12-22 — End: 2017-12-22
  Administered 2017-12-22: 4 mg via INTRAMUSCULAR
  Filled 2017-12-22: qty 1

## 2017-12-22 NOTE — ED Notes (Signed)
Pt reports that she has not had a BM for the last 10 days   Took an enema, with some results- but states she cannot keep anything down and has been losing weight  She reports that she has bad teeth  That Dr Glo Herring has sought the caus eof her weight loss, but her PCP could not find a cause so she has come here for answers

## 2017-12-22 NOTE — ED Triage Notes (Signed)
Pt states that she has not been able to "keep her food" on her stomach for the past 3 days, abd pain and constipation as well.

## 2017-12-22 NOTE — ED Provider Notes (Signed)
[facility] EMERGENCY DEPARTMENT Provider Note   CSN: [id number] Arrival date & time: 12/22/17  2138     History   Chief Complaint Chief Complaint  Patient presents with  . Emesis    HPI Olivia Lester is a 42 y.o. female.  HPI   42 year old female with a chief complaint of dental pain/mild left facial swelling.  Worse over the last couple days.  Generally poor dentition.  No fevers.  No throat pain or difficulty swallowing.  She is additionally concerned about progressive weight loss over multiple months and abdominal pain.  She reports that she takes approximately 6 to 800 mg of ibuprofen 3 times a day on a regular basis for back pain.  Past Medical History:  Diagnosis Date  . Back injury   . Gastric ulcer   . Gastric ulcer 10/14/2013  . Hepatitis C 10/14/2013  . Panic attack     Patient Active Problem List   Diagnosis Date Noted  . Gastric ulcer 10/14/2013  . Transaminitis 10/14/2013  . Tobacco dependence 10/14/2013  . Hepatitis C 10/14/2013  . Thrombocytopenia ([place]) 10/13/2013  . Duodenitis 10/12/2013  . Alcoholism ([place]) 10/12/2013  . Abdominal pain, epigastric 10/12/2013  . ANKLE PAIN 01/01/2008    Past Surgical History:  Procedure Laterality Date  . BACK SURGERY    . BREAST SURGERY    . ESOPHAGOGASTRODUODENOSCOPY (EGD) WITH PROPOFOL N/A 10/14/2013   Procedure: ESOPHAGOGASTRODUODENOSCOPY (EGD) WITH PROPOFOL;  Surgeon: Rogene Houston, MD;  Location: [facility];  Service: Endoscopy;  Laterality: N/A;  . TONSILLECTOMY       OB History    Gravida  7   Para  3   Term  3   Preterm      AB  4   Living  3     SAB  2   TAB  2   Ectopic      Multiple      Live Births  3            Home Medications    Prior to Admission medications   Medication Sig Start Date End Date Taking? Authorizing Provider  acetaminophen-codeine (TYLENOL #3) 300-30 MG tablet Take 2 tablets by mouth every 4 (four) hours as needed for moderate pain. 08/30/17   Jonnie Kind,  MD  diphenhydrAMINE (BENADRYL) 25 mg capsule Take 50 mg by mouth every 6 (six) hours as needed for allergies or sleep.     [provider]  HYDROcodone-acetaminophen (NORCO/VICODIN) 5-325 MG per tablet Take one-two tabs po q 4-6 hrs prn pain Patient not taking: Reported on 04/28/2015 08/03/14   Triplett, Tammy, PA-C  ibuprofen (ADVIL,MOTRIN) 200 MG tablet Take 800 mg by mouth every 8 (eight) hours as needed (pain).    [provider]  LORazepam (ATIVAN) 1 MG tablet Take 1 tablet (1 mg total) by mouth 2 (two) times daily as needed for anxiety. Patient not taking: Reported on 08/30/2017 04/28/15   Francine Graven, DO  medroxyPROGESTERone (PROVERA) 10 MG tablet Take 1 tablet (10 mg total) by mouth daily. Patient not taking: Reported on 08/03/2014 06/13/14   Jonnie Kind, MD  metoCLOPramide (REGLAN) 10 MG tablet Take 1 tablet (10 mg total) by mouth every 6 (six) hours as needed for nausea. Patient not taking: Reported on 54/08/7060 3/76/28   Delora Fuel, MD  pantoprazole (PROTONIX) 40 MG tablet Take 1 tablet (40 mg total) by mouth 2 (two) times daily before a meal. Patient not taking: Reported on 08/03/2014 10/15/13  Kathie Dike, MD  promethazine (PHENERGAN) 25 MG tablet Take 1 tablet (25 mg total) by mouth every 6 (six) hours as needed for nausea or vomiting. Patient not taking: Reported on 08/30/2017 04/28/15   Francine Graven, DO    Family History Family History  Problem Relation Age of Onset  . Cancer Mother 14       ovarian   . Hypertension Mother   . Early death Father   . Alcohol abuse Father     Social History Social History   Tobacco Use  . Smoking status: Current Every Day Smoker    Packs/day: 1.00    Years: 20.00    Pack years: 20.00    Types: Cigarettes  . Smokeless tobacco: Never Used  Substance Use Topics  . Alcohol use: Not Currently    Frequency: Never    Comment: "every couple of days "  . Drug use: No    Types: Marijuana    Comment: hx  marijuana use      Allergies   Other   Review of Systems Review of Systems  All systems reviewed and negative, other than as noted in HPI. Physical Exam Updated Vital Signs BP (!) 174/114 (BP Location: Right Arm)   Pulse 96   Temp 98.2 F (36.8 C) (Oral)   Resp 18   Ht 5\' 6"  (1.676 m)   Wt 45.4 kg (100 lb)   LMP 12/03/2017   SpO2 97%   BMI 16.14 kg/m   Physical Exam  Constitutional: She appears well-developed and well-nourished. No distress.  HENT:  Head: Normocephalic.  Poor dentition.  Left upper premolar molars decayed.  No drainable collection.  Left mild facial swelling/fullness.  Oropharynx is otherwise clear.  Normal sounding voice.  Uvula midline.  Handling secretions.  Eyes: Conjunctivae are normal. Right eye exhibits no discharge. Left eye exhibits no discharge.  Neck: Neck supple.  Cardiovascular: Normal rate, regular rhythm and normal heart sounds. Exam reveals no gallop and no friction rub.  No murmur heard. Pulmonary/Chest: Effort normal and breath sounds normal. No respiratory distress.  Abdominal: Soft. She exhibits no distension. There is no tenderness.  Musculoskeletal: She exhibits no edema or tenderness.  Neurological: She is alert.  Skin: Skin is warm and dry.  Psychiatric: She has a normal mood and affect. Her behavior is normal. Thought content normal.  Nursing note and vitals reviewed.    ED Treatments / Results  Labs (all labs ordered are listed, but only abnormal results are displayed) Labs Reviewed - No data to display  EKG None  Radiology No results found.  Procedures Procedures (including critical care time)  Medications Ordered in ED Medications - No data to display   Initial Impression / Assessment and Plan / ED Course  I have reviewed the triage vital signs and the nursing notes.  Pertinent labs & imaging results that were available during my care of the patient were reviewed by me and considered in my medical decision  making (see chart for details).     42 year old female with left sided facial pain/swelling.  Likely periapical abscess.  No drainable collection on exam.  She is afebrile nontoxic.  Will place her on penicillin.  Additionally some blood work was obtained for ongoing abdominal pain or weight loss.  Fairly unremarkable.  I suspect that she may have some element of gastritis or ulcer given that she uses NSAIDs daily.  Advised to simply come back if not stop.  PPI.  She was given prescription  for pain medicine to use at least for couple days for both her facial pain and back pain with goal of hopefully to decrease utilization of NSAIDs.  Final Clinical Impressions(s) / ED Diagnoses   Final diagnoses:  None    ED Discharge Orders    None       Virgel Manifold, MD 12/27/17 1301

## 2017-12-23 MED ORDER — PENICILLIN V POTASSIUM 500 MG PO TABS: 500.0000 mg | ORAL_TABLET | Freq: Four times a day (QID) | ORAL | 0 refills | Status: DC

## 2017-12-23 MED ORDER — TRAMADOL HCL 50 MG PO TABS: 50.0000 mg | ORAL_TABLET | Freq: Four times a day (QID) | ORAL | 0 refills | Status: DC | PRN

## 2017-12-23 MED ORDER — OMEPRAZOLE 20 MG PO CPDR: 20.0000 mg | DELAYED_RELEASE_CAPSULE | Freq: Every day | ORAL | 0 refills | Status: DC

## 2017-12-23 MED ORDER — METOCLOPRAMIDE HCL 10 MG PO TABS: 10.0000 mg | ORAL_TABLET | Freq: Four times a day (QID) | ORAL | 0 refills | Status: DC | PRN

## 2017-12-25 ENCOUNTER — Other Ambulatory Visit: Payer: Self-pay

## 2017-12-25 ENCOUNTER — Observation Stay (HOSPITAL_COMMUNITY)
Admission: EM | Admit: 2017-12-25 | Discharge: 2017-12-26 | Disposition: A | Discharge status: Home / Self Care | Payer: Medicaid Other | Attending: Internal Medicine | Admitting: Internal Medicine

## 2017-12-25 ENCOUNTER — Encounter (HOSPITAL_COMMUNITY): Payer: Self-pay | Admitting: Emergency Medicine

## 2017-12-25 ENCOUNTER — Emergency Department (HOSPITAL_COMMUNITY): Payer: Medicaid Other

## 2017-12-25 DIAGNOSIS — K047 Periapical abscess without sinus: Principal | ICD-10-CM

## 2017-12-25 DIAGNOSIS — K259 Gastric ulcer, unspecified as acute or chronic, without hemorrhage or perforation: Secondary | ICD-10-CM

## 2017-12-25 DIAGNOSIS — Z7982 Long term (current) use of aspirin: Secondary | ICD-10-CM | POA: Insufficient documentation

## 2017-12-25 DIAGNOSIS — Z79899 Other long term (current) drug therapy: Secondary | ICD-10-CM | POA: Insufficient documentation

## 2017-12-25 DIAGNOSIS — F17210 Nicotine dependence, cigarettes, uncomplicated: Secondary | ICD-10-CM | POA: Insufficient documentation

## 2017-12-25 DIAGNOSIS — B1920 Unspecified viral hepatitis C without hepatic coma: Secondary | ICD-10-CM | POA: Insufficient documentation

## 2017-12-25 DIAGNOSIS — F410 Panic disorder [episodic paroxysmal anxiety] without agoraphobia: Secondary | ICD-10-CM | POA: Insufficient documentation

## 2017-12-25 LAB — CBC WITH DIFFERENTIAL/PLATELET
BASOS ABS: 0 10*3/uL (ref 0.0–0.1)
BASOS PCT: 0 %
EOS ABS: 0.1 10*3/uL (ref 0.0–0.7)
EOS PCT: 0 %
HCT: 43.3 % (ref 36.0–46.0)
Hemoglobin: 14.8 g/dL (ref 12.0–15.0)
Lymphocytes Relative: 13 %
Lymphs Abs: 2.1 10*3/uL (ref 0.7–4.0)
MCH: 31.7 pg (ref 26.0–34.0)
MCHC: 34.2 g/dL (ref 30.0–36.0)
MCV: 92.7 fL (ref 78.0–100.0)
Monocytes Absolute: 1 10*3/uL (ref 0.1–1.0)
Monocytes Relative: 6 %
Neutro Abs: 12.9 10*3/uL — ABNORMAL HIGH (ref 1.7–7.7)
Neutrophils Relative %: 81 %
PLATELETS: 214 10*3/uL (ref 150–400)
RBC: 4.67 MIL/uL (ref 3.87–5.11)
RDW: 14 % (ref 11.5–15.5)
WBC: 16 10*3/uL — AB (ref 4.0–10.5)

## 2017-12-25 LAB — BASIC METABOLIC PANEL
ANION GAP: 12 (ref 5–15)
BUN: 8 mg/dL (ref 6–20)
CO2: 31 mmol/L (ref 22–32)
Calcium: 9.4 mg/dL (ref 8.9–10.3)
Chloride: 94 mmol/L — ABNORMAL LOW (ref 101–111)
Creatinine, Ser: 0.53 mg/dL (ref 0.44–1.00)
GFR calc Af Amer: >60 mL/min (ref >60)
Glucose, Bld: 114 mg/dL — ABNORMAL HIGH (ref 65–99)
POTASSIUM: 3.4 mmol/L — AB (ref 3.5–5.1)
SODIUM: 137 mmol/L (ref 135–145)

## 2017-12-25 LAB — HCG, QUANTITATIVE, PREGNANCY

## 2017-12-25 MED ORDER — ONDANSETRON HCL 4 MG/2ML IJ SOLN
4.0000 mg | Freq: Four times a day (QID) | INTRAMUSCULAR | Status: DC | PRN
  Administered 2017-12-25 – 2017-12-26 (×2): 4 mg via INTRAVENOUS
  Filled 2017-12-25 (×2): qty 2

## 2017-12-25 MED ORDER — CLINDAMYCIN PHOSPHATE 600 MG/50ML IV SOLN
600.0000 mg | Freq: Once | INTRAVENOUS | Status: AC
  Administered 2017-12-25: 600 mg via INTRAVENOUS
  Filled 2017-12-25: qty 50

## 2017-12-25 MED ORDER — HYDROMORPHONE HCL 1 MG/ML IJ SOLN
1.0000 mg | Freq: Once | INTRAMUSCULAR | Status: AC
  Administered 2017-12-25: 1 mg via INTRAVENOUS
  Filled 2017-12-25: qty 1

## 2017-12-25 MED ORDER — ENOXAPARIN SODIUM 40 MG/0.4ML ~~LOC~~ SOLN
40.0000 mg | SUBCUTANEOUS | Status: DC
  Administered 2017-12-25: 40 mg via SUBCUTANEOUS
  Filled 2017-12-25: qty 0.4

## 2017-12-25 MED ORDER — ONDANSETRON HCL 4 MG/2ML IJ SOLN
4.0000 mg | Freq: Once | INTRAMUSCULAR | Status: AC
  Administered 2017-12-25: 4 mg via INTRAVENOUS
  Filled 2017-12-25: qty 2

## 2017-12-25 MED ORDER — ONDANSETRON HCL 4 MG PO TABS: 4.0000 mg | ORAL_TABLET | Freq: Four times a day (QID) | ORAL | Status: DC | PRN

## 2017-12-25 MED ORDER — MORPHINE SULFATE (PF) 4 MG/ML IV SOLN: 6.0000 mg | Freq: Once | INTRAVENOUS | Status: DC

## 2017-12-25 MED ORDER — NICOTINE 21 MG/24HR TD PT24
21.0000 mg | MEDICATED_PATCH | Freq: Once | TRANSDERMAL | Status: DC
Start: 2017-12-25 — End: 2017-12-26
  Administered 2017-12-25: 21 mg via TRANSDERMAL
  Filled 2017-12-25: qty 1

## 2017-12-25 MED ORDER — POTASSIUM CHLORIDE CRYS ER 20 MEQ PO TBCR
20.0000 meq | EXTENDED_RELEASE_TABLET | Freq: Once | ORAL | Status: AC
  Administered 2017-12-25: 20 meq via ORAL
  Filled 2017-12-25: qty 1

## 2017-12-25 MED ORDER — HYDROMORPHONE HCL 1 MG/ML IJ SOLN
1.0000 mg | INTRAMUSCULAR | Status: DC | PRN
Start: 2017-12-25 — End: 2017-12-26
  Administered 2017-12-25 – 2017-12-26 (×4): 1 mg via INTRAVENOUS
  Filled 2017-12-25 (×5): qty 1

## 2017-12-25 MED ORDER — PANTOPRAZOLE SODIUM 40 MG PO TBEC
40.0000 mg | DELAYED_RELEASE_TABLET | Freq: Two times a day (BID) | ORAL | Status: DC
  Administered 2017-12-26: 40 mg via ORAL
  Filled 2017-12-25: qty 1

## 2017-12-25 MED ORDER — SODIUM CHLORIDE 0.9 % IV SOLN
INTRAVENOUS | Status: DC
  Administered 2017-12-25: via INTRAVENOUS

## 2017-12-25 MED ORDER — ACETAMINOPHEN 650 MG RE SUPP: 650.0000 mg | Freq: Four times a day (QID) | RECTAL | Status: DC | PRN

## 2017-12-25 MED ORDER — MORPHINE SULFATE (PF) 4 MG/ML IV SOLN
4.0000 mg | Freq: Once | INTRAVENOUS | Status: AC
  Administered 2017-12-25: 4 mg via INTRAVENOUS
  Filled 2017-12-25: qty 1

## 2017-12-25 MED ORDER — CLINDAMYCIN PHOSPHATE 600 MG/50ML IV SOLN
600.0000 mg | Freq: Three times a day (TID) | INTRAVENOUS | Status: DC
  Administered 2017-12-25 – 2017-12-26 (×2): 600 mg via INTRAVENOUS
  Filled 2017-12-25 (×2): qty 50

## 2017-12-25 MED ORDER — IOPAMIDOL (ISOVUE-300) INJECTION 61%
75.0000 mL | Freq: Once | INTRAVENOUS | Status: AC | PRN
  Administered 2017-12-25: 75 mL via INTRAVENOUS

## 2017-12-25 MED ORDER — ACETAMINOPHEN 325 MG PO TABS
650.0000 mg | ORAL_TABLET | Freq: Four times a day (QID) | ORAL | Status: DC | PRN
  Administered 2017-12-26: 650 mg via ORAL
  Filled 2017-12-25: qty 2

## 2017-12-25 NOTE — ED Triage Notes (Signed)
Pt reports left sided facial swelling ongoing.  Has been on abx x 3 days.  Slight swelling noted with no redness.  States was seen for same a few days ago.

## 2017-12-25 NOTE — ED Notes (Signed)
ED TO INPATIENT HANDOFF REPORT  Name/Age/Gender Olivia Lester 42 y.o. female  Code Status Code Status History    Date Active Date Inactive Code Status Order ID Comments User Context   10/12/2013 2034 10/15/2013 2023 Full Code [id number]  Doree Albee, MD Inpatient      Home/SNF/Other Home  Chief Complaint Facial Swelling  Level of Care/Admitting Diagnosis ED Disposition    ED Disposition Condition [place]: [facility] [[id number]]  Level of Care: Med-Surg [16]  Diagnosis: Dental abscess [716967]  Admitting Physician: Oswald Hillock [[id number]]  Attending Physician: Oswald Hillock [[id number]]  PT Class (Do Not Modify): Observation [104]  PT Acc Code (Do Not Modify): Observation [10022]       Medical History Past Medical History:  Diagnosis Date  . Back injury   . Gastric ulcer   . Gastric ulcer 10/14/2013  . Hepatitis C 10/14/2013  . Panic attack     Allergies No Known Allergies  IV Location/Drains/Wounds Patient Lines/Drains/Airways Status   Active Line/Drains/Airways    Name:   Placement date:   Placement time:   Site:   Days:   Peripheral IV 12/25/17 Right Antecubital   12/25/17    1732    Antecubital   less than 1   Wound / Incision (Open or Dehisced) 10/12/13 Other (Comment) Head Right scab   10/12/13    1935    Head   1535          Labs/Imaging Results for orders placed or performed during the hospital encounter of 12/25/17 (from the past 48 hour(s))  CBC with Differential     Status: Abnormal   Collection Time: 12/25/17  5:33 PM  Result Value Ref Range   WBC 16.0 (H) 4.0 - 10.5 K/uL   RBC 4.67 3.87 - 5.11 MIL/uL   Hemoglobin 14.8 12.0 - 15.0 g/dL   HCT 43.3 36.0 - 46.0 %   MCV 92.7 78.0 - 100.0 fL   MCH 31.7 26.0 - 34.0 pg   MCHC 34.2 30.0 - 36.0 g/dL   RDW 14.0 11.5 - 15.5 %   Platelets 214 150 - 400 K/uL   Neutrophils Relative % 81 %   Neutro Abs 12.9 (H) 1.7 - 7.7 K/uL   Lymphocytes Relative 13 %   Lymphs Abs 2.1 0.7 - 4.0  K/uL   Monocytes Relative 6 %   Monocytes Absolute 1.0 0.1 - 1.0 K/uL   Eosinophils Relative 0 %   Eosinophils Absolute 0.1 0.0 - 0.7 K/uL   Basophils Relative 0 %   Basophils Absolute 0.0 0.0 - 0.1 K/uL    Comment: Performed at [facility], [address] [place], [place] [address]  Basic metabolic panel     Status: Abnormal   Collection Time: 12/25/17  5:33 PM  Result Value Ref Range   Sodium 137 135 - 145 mmol/L   Potassium 3.4 (L) 3.5 - 5.1 mmol/L   Chloride 94 (L) 101 - 111 mmol/L   CO2 31 22 - 32 mmol/L   Glucose, Bld 114 (H) 65 - 99 mg/dL   BUN 8 6 - 20 mg/dL   Creatinine, Ser 0.53 0.44 - 1.00 mg/dL   Calcium 9.4 8.9 - 10.3 mg/dL   GFR calc non Af Amer >60 >60 mL/min   GFR calc Af Amer >60 >60 mL/min    Comment: (NOTE) The eGFR has been calculated using the CKD EPI equation. This calculation has not been validated in all  clinical situations. eGFR's persistently <60 mL/min signify possible Chronic Kidney Disease.    Anion gap 12 5 - 15    Comment: Performed at [facility], [address] [place], [place] [address]  hCG, quantitative, pregnancy     Status: None   Collection Time: 12/25/17  5:35 PM  Result Value Ref Range   hCG, Beta Chain, Quant, S <1 <5 mIU/mL    Comment:          GEST. AGE      CONC.  (mIU/mL)   <=1 WEEK        5 - 50     2 WEEKS       50 - 500     3 WEEKS       100 - 10,000     4 WEEKS     1,000 - 30,000     5 WEEKS     3,500 - 115,000   6-8 WEEKS     12,000 - 270,000    12 WEEKS     15,000 - 220,000        FEMALE AND NON-PREGNANT FEMALE:     LESS THAN 5 mIU/mL Performed at [facility], [address] [place], [place] [address]    Ct Maxillofacial W Contrast  Result Date: 12/25/2017 CLINICAL DATA:  Left-sided facial swelling.  On antibiotics. EXAM: CT MAXILLOFACIAL WITH CONTRAST TECHNIQUE: Multidetector CT imaging of the maxillofacial structures was performed with intravenous contrast. Multiplanar CT image reconstructions were also generated.  CONTRAST:  44m ISOVUE-300 IOPAMIDOL (ISOVUE-300) INJECTION 61% COMPARISON:  None. FINDINGS: Osseous: There is no facial fracture. There is a small periapical lucency at the root of tooth 11, with dehiscence of the anterior maxillary cortex. There is also a large carie within this tooth. There is moderate diffuse dental disease. Orbits: Normal Sinuses: Mild mucosal thickening along the left maxillary floor, likely odontogenic. Paranasal sinuses are otherwise clear. Soft tissues: There is left facial soft tissue swelling, predominantly at the upper lip. There is a left paramedian subperiosteal abscess measuring 1.8 x 1.0 cm, directly overlying the periapical lucency of tooth 11. Limited intracranial: Normal IMPRESSION: Subperiosteal abscess measuring 1.8 x 1.0 cm, adjacent to anteriorly dehiscent periapical lucency of tooth 11, with associated surrounding inflammatory change. Electronically Signed   By: KUlyses JarredM.D.   On: 12/25/2017 20:04    Pending Labs Unresulted Labs (From admission, onward)   Start     Ordered   Signed and Held  CBC  (enoxaparin (LOVENOX)    CrCl >/= 30 ml/min)  Once,   R    Comments:  Baseline for enoxaparin therapy IF NOT ALREADY DRAWN.  Notify MD if PLT < 100 K.    Signed and Held   Signed and Held  Creatinine, serum  (enoxaparin (LOVENOX)    CrCl >/= 30 ml/min)  Once,   R    Comments:  Baseline for enoxaparin therapy IF NOT ALREADY DRAWN.    Signed and Held   Signed and Held  Creatinine, serum  (enoxaparin (LOVENOX)    CrCl >/= 30 ml/min)  Weekly,   R    Comments:  while on enoxaparin therapy    Signed and Held   Signed and Held  CBC  Tomorrow morning,   R     Signed and Held   Signed and Held  Comprehensive metabolic panel  Tomorrow morning,   R     Signed and Held      Vitals/Pain Today's Vitals  12/25/17 1900 12/25/17 1942 12/25/17 2131 12/25/17 2151  BP:  (!) 160/107  (!) 169/104  Pulse: 61 61  (!) 59  Resp: 18 18  16   Temp:    97.9 F (36.6 C)   TempSrc:    Oral  SpO2: 100% 100%  98%  PainSc:  8  9      Isolation Precautions No active isolations  Medications Medications  nicotine (NICODERM CQ - dosed in mg/24 hours) patch 21 mg (21 mg Transdermal Patch Applied 12/25/17 2131)  morphine 4 MG/ML injection 4 mg (4 mg Intravenous Given 12/25/17 1736)  ondansetron (ZOFRAN) injection 4 mg (4 mg Intravenous Given 12/25/17 1736)  clindamycin (CLEOCIN) IVPB 600 mg (0 mg Intravenous Stopped 12/25/17 1806)  iopamidol (ISOVUE-300) 61 % injection 75 mL (75 mLs Intravenous Contrast Given 12/25/17 1930)  HYDROmorphone (DILAUDID) injection 1 mg (1 mg Intravenous Given 12/25/17 1942)    Mobility walks

## 2017-12-25 NOTE — ED Notes (Addendum)
Patient made aware that provider as ordered "NPO" status at this time.

## 2017-12-25 NOTE — ED Notes (Signed)
Patient c/o pain in the IV site, IV with good blood return and flushing appropriately, offered to restart IV for patient comfort in a different location. Brought in new supplies to restart a new IV at pt request, then patient decided she did not want restart. Asking about pain meds, awaiting orders by admitting provider.

## 2017-12-25 NOTE — ED Notes (Addendum)
Admitting Provider at bedside. 

## 2017-12-25 NOTE — H&P (Signed)
[facility] H&P    Patient Demographics:    Olivia Lester, is a 42 y.o. female  MRN: [id number]  DOB - December 26, 1975  Admit Date - 12/25/2017  Referring MD/NP/PA: Evalee Jefferson  Outpatient Primary MD for the patient is Patient, No Pcp Per  Patient coming from: Home  Chief complaint-dental pain   HPI:    Olivia Lester  is a 42 y.o. female, with history of gastric ulcer, hepatitis C was seen in the ED 2 days ago for dental abscess and placed on penicillin.  Patient came back to hospital with worsening pain and left-sided facial swelling.  Patient says that the antibiotics did not help and she has not seen dentist. In the ED CT scan maxillofacial with contrast showed subperiosteal abscess measuring 1.8 x 1.0 cm adjacent to anteriorly dehiscent peripheral lucency of tooth 11 with surrounding inflammatory change. Patient started on clindamycin. She denies chest pain or shortness of breath. Complains of subjective fever and sweating Denies diarrhea but had vomiting and nausea No previous history of stroke or seizures. No history of cancer    Review of systems:      All other systems reviewed and are negative.   With Past History of the following :    Past Medical History:  Diagnosis Date  . Back injury   . Gastric ulcer   . Gastric ulcer 10/14/2013  . Hepatitis C 10/14/2013  . Panic attack       Past Surgical History:  Procedure Laterality Date  . BACK SURGERY    . BREAST SURGERY    . ESOPHAGOGASTRODUODENOSCOPY (EGD) WITH PROPOFOL N/A 10/14/2013   Procedure: ESOPHAGOGASTRODUODENOSCOPY (EGD) WITH PROPOFOL;  Surgeon: Rogene Houston, MD;  Location: [facility];  Service: Endoscopy;  Laterality: N/A;  . TONSILLECTOMY        Social History:      Social History   Tobacco Use  . Smoking status: Current Every Day Smoker    Packs/day: 1.00    Years: 20.00    Pack years: 20.00    Types: Cigarettes  . Smokeless tobacco:  Never Used  Substance Use Topics  . Alcohol use: Not Currently    Frequency: Never    Comment: "every couple of days "       Family History :     Family History  Problem Relation Age of Onset  . Cancer Mother 51       ovarian   . Hypertension Mother   . Early death Father   . Alcohol abuse Father       Home Medications:   Prior to Admission medications   Medication Sig Start Date End Date Taking? Authorizing Provider  Aspirin-Acetaminophen-Caffeine (GOODY HEADACHE PO) Take 1 packet by mouth as needed (for pain).    [provider]  diphenhydrAMINE (BENADRYL) 25 mg capsule Take 50 mg by mouth every morning.    [provider]  ibuprofen (ADVIL,MOTRIN) 200 MG tablet Take 800 mg by mouth every 4 (four) hours as needed for mild pain or moderate pain.    [provider]  metoCLOPramide (REGLAN) 10 MG tablet Take 1 tablet (10 mg total) by mouth every 6 (six) hours as needed for nausea. 12/22/17   Virgel Manifold, MD  naproxen sodium (ALEVE) 220 MG tablet Take 220 mg by mouth every morning.     [provider]  omeprazole (PRILOSEC) 20 MG capsule Take 1 capsule (20 mg total) by mouth daily. 12/22/17   Virgel Manifold, MD  pantoprazole (PROTONIX) 40 MG tablet Take 1 tablet (40 mg total) by mouth 2 (two) times daily before a meal. 10/15/13   Kathie Dike, MD  penicillin v potassium (VEETID) 500 MG tablet Take 1 tablet (500 mg total) by mouth 4 (four) times daily for 7 days. 12/23/17 12/30/17  Virgel Manifold, MD  traMADol (ULTRAM) 50 MG tablet Take 1 tablet (50 mg total) by mouth every 6 (six) hours as needed. 12/23/17   Virgel Manifold, MD     Allergies:    No Known Allergies   Physical Exam:   Vitals  Blood pressure (!) 169/104, pulse (!) 59, temperature 97.9 F (36.6 C), temperature source Oral, resp. rate 16, last menstrual period 12/03/2017, SpO2 98 %.  1.  General: Appears in no acute distress  2. Psychiatric:  Intact judgement and  insight,  awake alert, oriented x 3.  3. Neurologic: No focal neurological deficits, all cranial nerves intact.Strength 5/5 all 4 extremities, sensation intact all 4 extremities, plantars down going.  4. Eyes :  anicteric sclerae, moist conjunctivae with no lid lag. PERRLA.  5. ENMT:  Very poor oral hygiene, attrition  of left upper incisors noted, swollen gums, swelling noted on the left side of the face   6. Neck:  supple, no cervical lymphadenopathy appriciated, No thyromegaly  7. Respiratory : Normal respiratory effort, good air movement bilaterally,clear to  auscultation bilaterally  8. Cardiovascular : RRR, no gallops, rubs or murmurs, no leg edema  9. Gastrointestinal:  Positive bowel sounds, abdomen soft, non-tender to palpation,no hepatosplenomegaly, no rigidity or guarding       10. Skin:  No cyanosis, normal texture and turgor, no rash, lesions or ulcers  11.Musculoskeletal:  Good muscle tone,  joints appear normal , no effusions,  normal range of motion    Data Review:    CBC Recent Labs  Lab 12/22/17 2253 12/25/17 1733  WBC 12.9* 16.0*  HGB 13.3 14.8  HCT 39.8 43.3  PLT 194 214  MCV 93.2 92.7  MCH 31.1 31.7  MCHC 33.4 34.2  RDW 13.9 14.0  LYMPHSABS 2.7 2.1  MONOABS 0.6 1.0  EOSABS 0.1 0.1  BASOSABS 0.0 0.0   ------------------------------------------------------------------------------------------------------------------  Chemistries  Recent Labs  Lab 12/22/17 2253 12/25/17 1733  NA 140 137  K 3.2* 3.4*  CL 104 94*  CO2 26 31  GLUCOSE 68 114*  BUN 11 8  CREATININE 0.52 0.53  CALCIUM 9.2 9.4  AST 26  --   ALT 24  --   ALKPHOS 40  --   BILITOT 1.0  --    ------------------------------------------------------------------------------------------------------------------  ------------------------------------------------------------------------------------------------------------------ GFR: Estimated Creatinine Clearance: 66.3 mL/min (by C-G  formula based on SCr of 0.53 mg/dL). Liver Function Tests: Recent Labs  Lab 12/22/17 2253  AST 26  ALT 24  ALKPHOS 40  BILITOT 1.0  PROT 7.2  ALBUMIN 4.4   Recent Labs  Lab 12/22/17 2253  LIPASE 20   No results for input(s): AMMONIA in the last 168 hours. Coagulation Profile: No results for input(s): INR, PROTIME in the last 168 hours. Cardiac  Enzymes: No results for input(s): CKTOTAL, CKMB, CKMBINDEX, TROPONINI in the last 168 hours. BNP (last 3 results) No results for input(s): PROBNP in the last 8760 hours. HbA1C: No results for input(s): HGBA1C in the last 72 hours. CBG: No results for input(s): GLUCAP in the last 168 hours. Lipid Profile: No results for input(s): CHOL, HDL, LDLCALC, TRIG, CHOLHDL, LDLDIRECT in the last 72 hours. Thyroid Function Tests: No results for input(s): TSH, T4TOTAL, FREET4, T3FREE, THYROIDAB in the last 72 hours. Anemia Panel: No results for input(s): VITAMINB12, FOLATE, FERRITIN, TIBC, IRON, RETICCTPCT in the last 72 hours.  --------------------------------------------------------------------------------------------------------------- Urine analysis:    Component Value Date/Time   COLORURINE YELLOW 12/22/2017 2223   APPEARANCEUR CLEAR 12/22/2017 2223   APPEARANCEUR Clear 12/07/2013 0133   LABSPEC 1.005 12/22/2017 2223   LABSPEC 1.006 12/07/2013 0133   PHURINE 6.0 12/22/2017 2223   GLUCOSEU NEGATIVE 12/22/2017 2223   GLUCOSEU Negative 12/07/2013 0133   HGBUR MODERATE (A) 12/22/2017 2223   BILIRUBINUR NEGATIVE 12/22/2017 2223   BILIRUBINUR Negative 12/07/2013 0133   KETONESUR NEGATIVE 12/22/2017 2223   PROTEINUR NEGATIVE 12/22/2017 2223   UROBILINOGEN 0.2 04/28/2015 1559   NITRITE NEGATIVE 12/22/2017 2223   LEUKOCYTESUR NEGATIVE 12/22/2017 2223   LEUKOCYTESUR Negative 12/07/2013 0133      Imaging Results:    Ct Maxillofacial W Contrast  Result Date: 12/25/2017 CLINICAL DATA:  Left-sided facial swelling.  On antibiotics. EXAM:  CT MAXILLOFACIAL WITH CONTRAST TECHNIQUE: Multidetector CT imaging of the maxillofacial structures was performed with intravenous contrast. Multiplanar CT image reconstructions were also generated. CONTRAST:  48mL ISOVUE-300 IOPAMIDOL (ISOVUE-300) INJECTION 61% COMPARISON:  None. FINDINGS: Osseous: There is no facial fracture. There is a small periapical lucency at the root of tooth 11, with dehiscence of the anterior maxillary cortex. There is also a large carie within this tooth. There is moderate diffuse dental disease. Orbits: Normal Sinuses: Mild mucosal thickening along the left maxillary floor, likely odontogenic. Paranasal sinuses are otherwise clear. Soft tissues: There is left facial soft tissue swelling, predominantly at the upper lip. There is a left paramedian subperiosteal abscess measuring 1.8 x 1.0 cm, directly overlying the periapical lucency of tooth 11. Limited intracranial: Normal IMPRESSION: Subperiosteal abscess measuring 1.8 x 1.0 cm, adjacent to anteriorly dehiscent periapical lucency of tooth 11, with associated surrounding inflammatory change. Electronically Signed   By: Ulyses Jarred M.D.   On: 12/25/2017 20:04       Assessment & Plan:    Active Problems:   Gastric ulcer   Dental abscess   1. Dental abscess-CT scan maxillofacial shows subperiosteal abscess, will start clindamycin 600 mg IV every 8 hours.  Dilaudid 1 mg every 4 hours as needed for pain.  If patient does not improve with IV antibiotics consider consulting oral surgeon at [facility] in a.m. 2. Hypokalemia-potassium is 3.4, will replace potassium and check BMP in a.m. 3. Gastric ulcer-continue Protonix    DVT Prophylaxis-   Lovenox   AM Labs Ordered, also please review Full Orders  Family Communication: Admission, patients condition and plan of care including tests being ordered have been discussed with the patient and her boyfriend who indicate understanding and agree with the plan and Code  Status.  Code Status: Full code  Admission status: Observation  Time spent in minutes : 60 minutes   Oswald Hillock M.D on 12/25/2017 at 10:23 PM  Between 7am to 7pm - Pager - [phone]. After 7pm go to [email or web address] - password [facility]  [facility] - Office  [phone]

## 2017-12-25 NOTE — ED Provider Notes (Signed)
[facility] EMERGENCY DEPARTMENT Provider Note   CSN: [id number] Arrival date & time: 12/25/17  1505     History   Chief Complaint Chief Complaint  Patient presents with  . Facial Swelling    HPI Olivia Lester is a 42 y.o. female who was seen here 3 days ago for a dental abscess, placed on PCN and presents with worsening pain and left sided facial swelling.  She has had subjective fevers and endorses nausea without emesis. She denies eye pain but has pressure beneath the eye due to the swelling. Her left nostril is swollen with her nose deviating to the right currently secondary to edema. She has poor dentition, but denies prior problems with abscess which she suspects is localized at the left lateral upper incisor  The history is provided by the patient.    Past Medical History:  Diagnosis Date  . Back injury   . Gastric ulcer   . Gastric ulcer 10/14/2013  . Hepatitis C 10/14/2013  . Panic attack     Patient Active Problem List   Diagnosis Date Noted  . Dental abscess 12/25/2017  . Gastric ulcer 10/14/2013  . Transaminitis 10/14/2013  . Tobacco dependence 10/14/2013  . Hepatitis C 10/14/2013  . Thrombocytopenia ([place]) 10/13/2013  . Duodenitis 10/12/2013  . Alcoholism ([place]) 10/12/2013  . Abdominal pain, epigastric 10/12/2013  . ANKLE PAIN 01/01/2008    Past Surgical History:  Procedure Laterality Date  . BACK SURGERY    . BREAST SURGERY    . ESOPHAGOGASTRODUODENOSCOPY (EGD) WITH PROPOFOL N/A 10/14/2013   Procedure: ESOPHAGOGASTRODUODENOSCOPY (EGD) WITH PROPOFOL;  Surgeon: Rogene Houston, MD;  Location: [facility];  Service: Endoscopy;  Laterality: N/A;  . TONSILLECTOMY       OB History    Gravida  7   Para  3   Term  3   Preterm      AB  4   Living  3     SAB  2   TAB  2   Ectopic      Multiple      Live Births  3            Home Medications    Prior to Admission medications   Medication Sig Start Date End Date Taking? Authorizing Provider    Aspirin-Acetaminophen-Caffeine (GOODY HEADACHE PO) Take 1 packet by mouth as needed (for pain).    [provider]  diphenhydrAMINE (BENADRYL) 25 mg capsule Take 50 mg by mouth every morning.    [provider]  ibuprofen (ADVIL,MOTRIN) 200 MG tablet Take 800 mg by mouth every 4 (four) hours as needed for mild pain or moderate pain.    [provider]  metoCLOPramide (REGLAN) 10 MG tablet Take 1 tablet (10 mg total) by mouth every 6 (six) hours as needed for nausea. 12/22/17   Virgel Manifold, MD  naproxen sodium (ALEVE) 220 MG tablet Take 220 mg by mouth every morning.     [provider]  omeprazole (PRILOSEC) 20 MG capsule Take 1 capsule (20 mg total) by mouth daily. 12/22/17   Virgel Manifold, MD  pantoprazole (PROTONIX) 40 MG tablet Take 1 tablet (40 mg total) by mouth 2 (two) times daily before a meal. 10/15/13   Kathie Dike, MD  penicillin v potassium (VEETID) 500 MG tablet Take 1 tablet (500 mg total) by mouth 4 (four) times daily for 7 days. 12/23/17 12/30/17  Virgel Manifold, MD  traMADol (ULTRAM) 50 MG tablet Take 1 tablet (50 mg  total) by mouth every 6 (six) hours as needed. 12/23/17   Virgel Manifold, MD    Family History Family History  Problem Relation Age of Onset  . Cancer Mother 66       ovarian   . Hypertension Mother   . Early death Father   . Alcohol abuse Father     Social History Social History   Tobacco Use  . Smoking status: Current Every Day Smoker    Packs/day: 1.00    Years: 20.00    Pack years: 20.00    Types: Cigarettes  . Smokeless tobacco: Never Used  Substance Use Topics  . Alcohol use: Not Currently    Frequency: Never    Comment: "every couple of days "  . Drug use: No    Types: Marijuana    Comment: hx marijuana use      Allergies   Patient has no known allergies.   Review of Systems Review of Systems  Constitutional: Positive for fever.  HENT: Positive for dental problem and facial swelling. Negative for  sore throat.   Respiratory: Negative for shortness of breath.   Gastrointestinal: Positive for nausea.  Musculoskeletal: Negative for neck pain and neck stiffness.     Physical Exam Updated Vital Signs BP (!) 169/104   Pulse (!) 59   Temp 97.9 F (36.6 C) (Oral)   Resp 16   LMP 12/03/2017   SpO2 98%   Physical Exam  Constitutional: She is oriented to person, place, and time. She appears well-developed and well-nourished. No distress.  HENT:  Head: Atraumatic.  Right Ear: Tympanic membrane and external ear normal.  Left Ear: Tympanic membrane and external ear normal.  Mouth/Throat: Oropharynx is clear and moist and mucous membranes are normal. No oral lesions. No trismus in the jaw. Abnormal dentition. Dental abscesses present.    Generalized poor dentition with decay and cavities.  Gingival edema located along left upper lateral incisor and premolar areas. No fluctuant drainable abscess identified.  Moderated induration left face including left upper lip and along left nose causing right nasal deviation.  No nasal septal edema. Edema to left infraorbital space.    Eyes: Pupils are equal, round, and reactive to light. Conjunctivae and EOM are normal.  eom's normal without pain.   Neck: Normal range of motion. Neck supple.  Cardiovascular: Normal rate and normal heart sounds.  Pulmonary/Chest: Effort normal.  Abdominal: She exhibits no distension.  Musculoskeletal: Normal range of motion.  Lymphadenopathy:    She has no cervical adenopathy.  Neurological: She is alert and oriented to person, place, and time.  Skin: Skin is warm and dry. No erythema.  Psychiatric: She has a normal mood and affect.     ED Treatments / Results  Labs (all labs ordered are listed, but only abnormal results are displayed) Labs Reviewed  CBC WITH DIFFERENTIAL/PLATELET - Abnormal; Notable for the following components:      Result Value   WBC 16.0 (*)    Neutro Abs 12.9 (*)    All other  components within normal limits  BASIC METABOLIC PANEL - Abnormal; Notable for the following components:   Potassium 3.4 (*)    Chloride 94 (*)    Glucose, Bld 114 (*)    All other components within normal limits  HCG, QUANTITATIVE, PREGNANCY    EKG None  Radiology Ct Maxillofacial W Contrast  Result Date: 12/25/2017 CLINICAL DATA:  Left-sided facial swelling.  On antibiotics. EXAM: CT MAXILLOFACIAL WITH CONTRAST TECHNIQUE: Multidetector CT  imaging of the maxillofacial structures was performed with intravenous contrast. Multiplanar CT image reconstructions were also generated. CONTRAST:  68mL ISOVUE-300 IOPAMIDOL (ISOVUE-300) INJECTION 61% COMPARISON:  None. FINDINGS: Osseous: There is no facial fracture. There is a small periapical lucency at the root of tooth 11, with dehiscence of the anterior maxillary cortex. There is also a large carie within this tooth. There is moderate diffuse dental disease. Orbits: Normal Sinuses: Mild mucosal thickening along the left maxillary floor, likely odontogenic. Paranasal sinuses are otherwise clear. Soft tissues: There is left facial soft tissue swelling, predominantly at the upper lip. There is a left paramedian subperiosteal abscess measuring 1.8 x 1.0 cm, directly overlying the periapical lucency of tooth 11. Limited intracranial: Normal IMPRESSION: Subperiosteal abscess measuring 1.8 x 1.0 cm, adjacent to anteriorly dehiscent periapical lucency of tooth 11, with associated surrounding inflammatory change. Electronically Signed   By: Ulyses Jarred M.D.   On: 12/25/2017 20:04    Procedures Procedures (including critical care time)  Medications Ordered in ED Medications  nicotine (NICODERM CQ - dosed in mg/24 hours) patch 21 mg (21 mg Transdermal Patch Applied 12/25/17 2131)  morphine 4 MG/ML injection 4 mg (4 mg Intravenous Given 12/25/17 1736)  ondansetron (ZOFRAN) injection 4 mg (4 mg Intravenous Given 12/25/17 1736)  clindamycin (CLEOCIN) IVPB 600 mg (0  mg Intravenous Stopped 12/25/17 1806)  iopamidol (ISOVUE-300) 61 % injection 75 mL (75 mLs Intravenous Contrast Given 12/25/17 1930)  HYDROmorphone (DILAUDID) injection 1 mg (1 mg Intravenous Given 12/25/17 1942)     Initial Impression / Assessment and Plan / ED Course  I have reviewed the triage vital signs and the nursing notes.  Pertinent labs & imaging results that were available during my care of the patient were reviewed by me and considered in my medical decision making (see chart for details).     Pt with dental abscess now with spreading facial infection despite day 3 of PCN. Given clindamycin IV here.  CT imaging and labs reviewed. Discussed with Dr. Darrick Meigs for admission for further IV abx, possible oral surgery consult if pt does not respond to clindamycin.  Final Clinical Impressions(s) / ED Diagnoses   Final diagnoses:  Dental abscess   Facial cellulitis Dental abscess ED Discharge Orders    None       Landis Martins 12/25/17 2222    Mesner, Corene Cornea, MD 12/25/17 2315

## 2017-12-26 LAB — CBC
HCT: 42.9 % (ref 36.0–46.0)
Hemoglobin: 14.2 g/dL (ref 12.0–15.0)
MCH: 30.9 pg (ref 26.0–34.0)
MCHC: 33.1 g/dL (ref 30.0–36.0)
MCV: 93.3 fL (ref 78.0–100.0)
PLATELETS: 224 10*3/uL (ref 150–400)
RBC: 4.6 MIL/uL (ref 3.87–5.11)
RDW: 14 % (ref 11.5–15.5)
WBC: 13.1 10*3/uL — AB (ref 4.0–10.5)

## 2017-12-26 LAB — COMPREHENSIVE METABOLIC PANEL
ALBUMIN: 4.1 g/dL (ref 3.5–5.0)
ALT: 23 U/L (ref 14–54)
AST: 24 U/L (ref 15–41)
Alkaline Phosphatase: 51 U/L (ref 38–126)
Anion gap: 9 (ref 5–15)
BUN: 6 mg/dL (ref 6–20)
CHLORIDE: 96 mmol/L — AB (ref 101–111)
CO2: 34 mmol/L — AB (ref 22–32)
CREATININE: 0.5 mg/dL (ref 0.44–1.00)
Calcium: 9.3 mg/dL (ref 8.9–10.3)
GFR calc Af Amer: >60 mL/min (ref >60)
GLUCOSE: 97 mg/dL (ref 65–99)
POTASSIUM: 3.2 mmol/L — AB (ref 3.5–5.1)
Sodium: 139 mmol/L (ref 135–145)
Total Bilirubin: 0.5 mg/dL (ref 0.3–1.2)
Total Protein: 7.4 g/dL (ref 6.5–8.1)

## 2017-12-26 LAB — MRSA PCR SCREENING: MRSA BY PCR: NEGATIVE

## 2017-12-26 MED ORDER — HYDROMORPHONE HCL 1 MG/ML IJ SOLN
1.0000 mg | Freq: Once | INTRAMUSCULAR | Status: AC
  Administered 2017-12-26: 1 mg via INTRAVENOUS

## 2017-12-26 MED ORDER — HYDROCODONE-ACETAMINOPHEN 5-300 MG PO TABS: 1.0000 | ORAL_TABLET | ORAL | 0 refills | Status: DC | PRN

## 2017-12-26 MED ORDER — CLINDAMYCIN HCL 300 MG PO CAPS: 300.0000 mg | ORAL_CAPSULE | Freq: Four times a day (QID) | ORAL | 0 refills | Status: AC

## 2017-12-26 NOTE — Discharge Summary (Signed)
Physician Discharge Summary  Olivia Lester CBS:[id number] DOB: 08-Feb-1976 DOA: 12/25/2017  PCP: Patient, No Pcp Per  Admit date: 12/25/2017 Discharge date: 12/26/2017  Time spent: 45 minutes  Recommendations for Outpatient Follow-up:  -Will be discharged home today. -Advised to follow up with dental care ASAP. Case management has provided patient with a couple of different options. -To complete a 14 day course of clindamycin.   Discharge Diagnoses:  Active Problems:   Gastric ulcer   Dental abscess   Discharge Condition: Stable and improved  Filed Weights   12/25/17 2326  Weight: 43.7 kg (96 lb 5.5 oz)    History of present illness:  As per Dr. Darrick Meigs on 6/3: Olivia Lester  is a 42 y.o. female, with history of gastric ulcer, hepatitis C was seen in the ED 2 days ago for dental abscess and placed on penicillin.  Patient came back to hospital with worsening pain and left-sided facial swelling.  Patient says that the antibiotics did not help and she has not seen dentist. In the ED CT scan maxillofacial with contrast showed subperiosteal abscess measuring 1.8 x 1.0 cm adjacent to anteriorly dehiscent peripheral lucency of tooth 11 with surrounding inflammatory change. Patient started on clindamycin. She denies chest pain or shortness of breath. Complains of subjective fever and sweating Denies diarrhea but had vomiting and nausea No previous history of stroke or seizures. No history of cancer    Hospital Course:   Dental abscess -Patient has been trying to get in to see dentist, however has been unable due to financial constraints.  She works full-time as a Educational psychologist and she has not "been able to make ends meet". -She will need to complete a 14-daycourse of antibiotics, will prescribe clindamycin which should cover anaerobes that are likely present in the mouth flora. -We will also give 25 tablets of Vicodin to take as needed for pain. -Case manager has given her some community resources  for dental clinics and she is encouraged to follow-up as soon as possible.  Procedures:  None   Consultations:  None  Discharge Instructions  Discharge Instructions    Diet - low sodium heart healthy   Complete by:  As directed    Increase activity slowly   Complete by:  As directed      Allergies as of 12/26/2017   No Known Allergies     Medication List    STOP taking these medications   ALEVE 220 MG tablet Generic drug:  naproxen sodium   diphenhydrAMINE 25 mg capsule Commonly known as:  BENADRYL   GOODY HEADACHE PO   ibuprofen 200 MG tablet Commonly known as:  ADVIL,MOTRIN   metoCLOPramide 10 MG tablet Commonly known as:  REGLAN   penicillin v potassium 500 MG tablet Commonly known as:  VEETID   traMADol 50 MG tablet Commonly known as:  ULTRAM     TAKE these medications   clindamycin 300 MG capsule Commonly known as:  CLEOCIN Take 1 capsule (300 mg total) by mouth 4 (four) times daily for 14 days.   HYDROcodone-Acetaminophen 5-300 MG Tabs Commonly known as:  VICODIN Take 1 tablet by mouth every 4 (four) hours as needed.   omeprazole 20 MG capsule Commonly known as:  PRILOSEC Take 1 capsule (20 mg total) by mouth daily.      No Known Allergies Follow-up Information    [facility] Follow up.   Why:  Please call to schecule an appointment- they will help you find free clinics  for needed dental work Contact information: [phone]           The results of significant diagnostics from this hospitalization (including imaging, microbiology, ancillary and laboratory) are listed below for reference.    Significant Diagnostic Studies: Ct Maxillofacial W Contrast  Result Date: 12/25/2017 CLINICAL DATA:  Left-sided facial swelling.  On antibiotics. EXAM: CT MAXILLOFACIAL WITH CONTRAST TECHNIQUE: Multidetector CT imaging of the maxillofacial structures was performed with intravenous contrast. Multiplanar CT image reconstructions were also  generated. CONTRAST:  2mL ISOVUE-300 IOPAMIDOL (ISOVUE-300) INJECTION 61% COMPARISON:  None. FINDINGS: Osseous: There is no facial fracture. There is a small periapical lucency at the root of tooth 11, with dehiscence of the anterior maxillary cortex. There is also a large carie within this tooth. There is moderate diffuse dental disease. Orbits: Normal Sinuses: Mild mucosal thickening along the left maxillary floor, likely odontogenic. Paranasal sinuses are otherwise clear. Soft tissues: There is left facial soft tissue swelling, predominantly at the upper lip. There is a left paramedian subperiosteal abscess measuring 1.8 x 1.0 cm, directly overlying the periapical lucency of tooth 11. Limited intracranial: Normal IMPRESSION: Subperiosteal abscess measuring 1.8 x 1.0 cm, adjacent to anteriorly dehiscent periapical lucency of tooth 11, with associated surrounding inflammatory change. Electronically Signed   By: Ulyses Jarred M.D.   On: 12/25/2017 20:04    Microbiology: Recent Results (from the past 240 hour(s))  MRSA PCR Screening     Status: None   Collection Time: 12/25/17 11:19 PM  Result Value Ref Range Status   MRSA by PCR NEGATIVE NEGATIVE Final    Comment:        The GeneXpert MRSA Assay (FDA approved for NASAL specimens only), is one component of a comprehensive MRSA colonization surveillance program. It is not intended to diagnose MRSA infection nor to guide or monitor treatment for MRSA infections. Performed at [facility], [address], [place], [place] [address]      Labs: Basic Metabolic Panel: Recent Labs  Lab 12/22/17 2253 12/25/17 1733 12/26/17 0426  NA 140 137 139  K 3.2* 3.4* 3.2*  CL 104 94* 96*  CO2 26 31 34*  GLUCOSE 68 114* 97  BUN 11 8 6   CREATININE 0.52 0.53 0.50  CALCIUM 9.2 9.4 9.3   Liver Function Tests: Recent Labs  Lab 12/22/17 2253 12/26/17 0426  AST 26 24  ALT 24 23  ALKPHOS 40 51  BILITOT 1.0 0.5  PROT 7.2 7.4  ALBUMIN 4.4 4.1    Recent Labs  Lab 12/22/17 2253  LIPASE 20   No results for input(s): AMMONIA in the last 168 hours. CBC: Recent Labs  Lab 12/22/17 2253 12/25/17 1733 12/26/17 0426  WBC 12.9* 16.0* 13.1*  NEUTROABS 9.6* 12.9*  --   HGB 13.3 14.8 14.2  HCT 39.8 43.3 42.9  MCV 93.2 92.7 93.3  PLT 194 214 224   Cardiac Enzymes: No results for input(s): CKTOTAL, CKMB, CKMBINDEX, TROPONINI in the last 168 hours. BNP: BNP (last 3 results) No results for input(s): BNP in the last 8760 hours.  ProBNP (last 3 results) No results for input(s): PROBNP in the last 8760 hours.  CBG: No results for input(s): GLUCAP in the last 168 hours.     Signed:  Lelon Frohlich  [facility] Pager: [phone] 12/26/2017, 10:58 AM

## 2017-12-26 NOTE — Progress Notes (Signed)
1242 Patient received d/c instructions, paperwork, and hard Rxs. IV catheter removed from RIGHT AC, intact w/no s/s of infection/infiltration noted at this time. Patient confirmed she had all of her belongings she came with, dressed herself, and left with significant other. PRN pain medication given to patient as requested for pain prior to d/c home.

## 2017-12-26 NOTE — Discharge Instructions (Signed)
Dental Abscess A dental abscess is pus in or around a tooth. Follow these instructions at home:  Take medicines only as told by your dentist.  If you were prescribed antibiotic medicine, finish all of it even if you start to feel better.  Rinse your mouth (gargle) often with salt water.  Do not drive or use heavy machinery, like a lawn mower, while taking pain medicine.  Do not apply heat to the outside of your mouth.  Keep all follow-up visits as told by your dentist. This is important. Contact a doctor if:  Your pain is worse, and medicine does not help. Get help right away if:  You have a fever or chills.  Your symptoms suddenly get worse.  You have a very bad headache.  You have problems breathing or swallowing.  You have trouble opening your mouth.  You have puffiness (swelling) in your neck or around your eye. This information is not intended to replace advice given to you by your health care provider. Make sure you discuss any questions you have with your health care provider. Document Released: 11/25/2014 Document Revised: 12/17/2015 Document Reviewed: 07/08/2014 Elsevier Interactive Patient Education  2018 [employer]vier Inc[employer]  

## 2017-12-26 NOTE — Care Management Note (Signed)
Case Management Note  Patient Details  Name: Olivia Lester MRN: [id number] Date of Birth: 04/07/1976    Expected Discharge Date:     12/26/2017             Expected Discharge Plan:  Home/Self Care  In-House Referral:     Discharge planning Services  CM Consult  Post Acute Care Choice:    Choice offered to:     DME Arranged:    DME Agency:     HH Arranged:    [place] Agency:     Status of Service:  Completed, signed off  If discussed at [employer] of Stay Meetings, dates discussed:    Additional Comments: Patient discharging home. Needs Follow up for dental services. No PCP or insurance. Discussed with community case manager, Olivia Lester. Patient referred to [facility] to establish appointment and they will assess patient and get her connected to most appropriate dental care in [facility]. Also provided [facility] number for free dental clinic.  Olivia Lester, Olivia Reading, RN 12/26/2017, 10:06 AM

## 2018-04-04 ENCOUNTER — Other Ambulatory Visit: Payer: Self-pay

## 2018-04-04 ENCOUNTER — Emergency Department (HOSPITAL_COMMUNITY)
Admission: EM | Admit: 2018-04-04 | Discharge: 2018-04-04 | Disposition: A | Discharge status: Home / Self Care | Payer: Self-pay | Attending: Emergency Medicine | Admitting: Emergency Medicine

## 2018-04-04 ENCOUNTER — Emergency Department (HOSPITAL_COMMUNITY): Payer: Self-pay

## 2018-04-04 ENCOUNTER — Encounter (HOSPITAL_COMMUNITY): Payer: Self-pay | Admitting: Emergency Medicine

## 2018-04-04 DIAGNOSIS — M25561 Pain in right knee: Secondary | ICD-10-CM

## 2018-04-04 DIAGNOSIS — M5441 Lumbago with sciatica, right side: Secondary | ICD-10-CM | POA: Insufficient documentation

## 2018-04-04 DIAGNOSIS — Z79899 Other long term (current) drug therapy: Secondary | ICD-10-CM | POA: Insufficient documentation

## 2018-04-04 DIAGNOSIS — F1020 Alcohol dependence, uncomplicated: Secondary | ICD-10-CM | POA: Insufficient documentation

## 2018-04-04 DIAGNOSIS — G8929 Other chronic pain: Secondary | ICD-10-CM

## 2018-04-04 DIAGNOSIS — F17210 Nicotine dependence, cigarettes, uncomplicated: Secondary | ICD-10-CM | POA: Insufficient documentation

## 2018-04-04 MED ORDER — CYCLOBENZAPRINE HCL 10 MG PO TABS: 10.0000 mg | ORAL_TABLET | Freq: Three times a day (TID) | ORAL | 0 refills | Status: DC

## 2018-04-04 MED ORDER — DEXAMETHASONE 4 MG PO TABS: 4.0000 mg | ORAL_TABLET | Freq: Two times a day (BID) | ORAL | 0 refills | Status: DC

## 2018-04-04 MED ORDER — KETOROLAC TROMETHAMINE 10 MG PO TABS
10.0000 mg | ORAL_TABLET | Freq: Once | ORAL | Status: DC
  Filled 2018-04-04: qty 1

## 2018-04-04 MED ORDER — CYCLOBENZAPRINE HCL 10 MG PO TABS
10.0000 mg | ORAL_TABLET | Freq: Once | ORAL | Status: AC
  Administered 2018-04-04: 10 mg via ORAL
  Filled 2018-04-04: qty 1

## 2018-04-04 MED ORDER — PREDNISONE 20 MG PO TABS
40.0000 mg | ORAL_TABLET | Freq: Once | ORAL | Status: AC
  Administered 2018-04-04: 40 mg via ORAL
  Filled 2018-04-04: qty 2

## 2018-04-04 MED ORDER — TRAMADOL HCL 50 MG PO TABS: 50.0000 mg | ORAL_TABLET | Freq: Four times a day (QID) | ORAL | 0 refills | Status: DC | PRN

## 2018-04-04 MED ORDER — ONDANSETRON HCL 4 MG PO TABS
4.0000 mg | ORAL_TABLET | Freq: Once | ORAL | Status: AC
  Administered 2018-04-04: 4 mg via ORAL
  Filled 2018-04-04: qty 1

## 2018-04-04 MED ORDER — IBUPROFEN 600 MG PO TABS: 600.0000 mg | ORAL_TABLET | Freq: Four times a day (QID) | ORAL | 0 refills | Status: DC

## 2018-04-04 NOTE — ED Provider Notes (Signed)
[facility] Provider Note   CSN: [id number] Arrival date & time: 04/04/18  1417     History   Chief Complaint Chief Complaint  Patient presents with  . Back Pain    HPI Olivia Lester is a 42 y.o. female.  The history is provided by the patient.  Back Pain   This is a chronic (acute on chronic) problem. Episode onset: onset several years ago, worse for the past month. The problem occurs hourly. The problem has been gradually worsening. The pain is associated with no known injury. The pain is present in the lumbar spine. The quality of the pain is described as shooting and burning. The pain radiates to the right thigh. The pain is moderate. Exacerbated by: standing and walking. The pain is the same all the time. Pertinent negatives include no chest pain, no fever, no numbness, no abdominal pain, no bowel incontinence, no perianal numbness, no bladder incontinence and no dysuria. She has tried ice and NSAIDs for the symptoms.  Knee Pain   This is a chronic (acute on chronic) problem. Episode onset: onset in july, worse today. The problem occurs constantly. The problem has been gradually worsening. The pain is present in the right knee. The quality of the pain is described as aching. The pain is moderate. Associated symptoms include limited range of motion and stiffness. Pertinent negatives include no numbness. The symptoms are aggravated by activity and standing. Treatments tried: nsaid. The treatment provided no relief. There has been no history of extremity trauma.    Past Medical History:  Diagnosis Date  . Back injury   . Gastric ulcer   . Gastric ulcer 10/14/2013  . Hepatitis C 10/14/2013  . Panic attack     Patient Active Problem List   Diagnosis Date Noted  . Dental abscess 12/25/2017  . Gastric ulcer 10/14/2013  . Transaminitis 10/14/2013  . Tobacco dependence 10/14/2013  . Hepatitis C 10/14/2013  . Thrombocytopenia ([place]) 10/13/2013  . Duodenitis  10/12/2013  . Alcoholism ([place]) 10/12/2013  . Abdominal pain, epigastric 10/12/2013  . ANKLE PAIN 01/01/2008    Past Surgical History:  Procedure Laterality Date  . BACK SURGERY    . BREAST SURGERY    . ESOPHAGOGASTRODUODENOSCOPY (EGD) WITH PROPOFOL N/A 10/14/2013   Procedure: ESOPHAGOGASTRODUODENOSCOPY (EGD) WITH PROPOFOL;  Surgeon: Rogene Houston, MD;  Location: [facility];  Service: Endoscopy;  Laterality: N/A;  . TONSILLECTOMY       OB History    Gravida  7   Para  3   Term  3   Preterm      AB  4   Living  3     SAB  2   TAB  2   Ectopic      Multiple      Live Births  3            Home Medications    Prior to Admission medications   Medication Sig Start Date End Date Taking? Authorizing Provider  HYDROcodone-Acetaminophen (VICODIN) 5-300 MG TABS Take 1 tablet by mouth every 4 (four) hours as needed. 12/26/17   Isaac Bliss, Rayford Halsted, MD  omeprazole (PRILOSEC) 20 MG capsule Take 1 capsule (20 mg total) by mouth daily. 12/22/17   Virgel Manifold, MD    Family History Family History  Problem Relation Age of Onset  . Cancer Mother 11       ovarian   . Hypertension Mother   . Early death Father   .  Alcohol abuse Father     Social History Social History   Tobacco Use  . Smoking status: Current Every Day Smoker    Packs/day: 1.00    Years: 20.00    Pack years: 20.00    Types: Cigarettes  . Smokeless tobacco: Never Used  Substance Use Topics  . Alcohol use: Not Currently    Frequency: Never  . Drug use: No    Types: Marijuana    Comment: hx marijuana use      Allergies   Patient has no known allergies.   Review of Systems Review of Systems  Constitutional: Negative for activity change and fever.       All ROS Neg except as noted in HPI  HENT: Negative for nosebleeds.   Eyes: Negative for photophobia and discharge.  Respiratory: Negative for cough, shortness of breath and wheezing.   Cardiovascular: Negative for chest pain and  palpitations.  Gastrointestinal: Negative for abdominal pain, blood in stool and bowel incontinence.  Genitourinary: Negative for bladder incontinence, dysuria, frequency and hematuria.  Musculoskeletal: Positive for arthralgias, back pain and stiffness. Negative for neck pain.  Skin: Negative.   Neurological: Negative for dizziness, seizures, speech difficulty and numbness.  Psychiatric/Behavioral: Negative for confusion and hallucinations.     Physical Exam Updated Vital Signs BP (!) 142/86 (BP Location: Left Arm)   Pulse 75   Temp 98.3 F (36.8 C) (Oral)   Resp 16   Ht 5\' 7"  (1.702 m)   Wt 47.6 kg   LMP 03/07/2018   SpO2 100%   BMI 16.45 kg/m   Physical Exam  Constitutional: She is oriented to person, place, and time. She appears well-developed and well-nourished.  Non-toxic appearance.  HENT:  Head: Normocephalic.  Right Ear: Tympanic membrane and external ear normal.  Left Ear: Tympanic membrane and external ear normal.  Eyes: Pupils are equal, round, and reactive to light. EOM and lids are normal.  Neck: Normal range of motion. Neck supple. Carotid bruit is not present.  Cardiovascular: Normal rate, regular rhythm, normal heart sounds, intact distal pulses and normal pulses.  Pulmonary/Chest: Breath sounds normal. No respiratory distress.  Abdominal: Soft. Bowel sounds are normal. There is no tenderness. There is no guarding.  Musculoskeletal:       Right knee: She exhibits decreased range of motion. She exhibits no deformity. Tenderness found. Medial joint line tenderness noted.       Lumbar back: She exhibits decreased range of motion, pain and spasm.  Pain with right straight leg raise at 30 degrees.    Lymphadenopathy:       Head (right side): No submandibular adenopathy present.       Head (left side): No submandibular adenopathy present.    She has no cervical adenopathy.  Neurological: She is alert and oriented to person, place, and time. She has normal  strength. No cranial nerve deficit or sensory deficit.  Skin: Skin is warm and dry.  Psychiatric: She has a normal mood and affect. Her speech is normal.  Nursing note and vitals reviewed.    ED Treatments / Results  Labs (all labs ordered are listed, but only abnormal results are displayed) Labs Reviewed - No data to display  EKG None  Radiology Dg Knee Complete 4 Views Right  Result Date: 04/04/2018 CLINICAL DATA:  Intermittent right medial knee pain after twisting injury with popping sensation. Worsening recently. EXAM: RIGHT KNEE - COMPLETE 4+ VIEW COMPARISON:  None. FINDINGS: No evidence of fracture, dislocation, or joint  effusion. No evidence of arthropathy or other focal bone abnormality. Soft tissues are unremarkable. IMPRESSION: Negative. If pain persists despite conservative therapy, MRI may be warranted for further characterization. Electronically Signed   By: Van Clines M.D.   On: 04/04/2018 15:18    Procedures Procedures (including critical care time)  Medications Ordered in ED Medications - No data to display   Initial Impression / Assessment and Plan / ED Course  I have reviewed the triage vital signs and the nursing notes.  Pertinent labs & imaging results that were available during my care of the patient were reviewed by me and considered in my medical decision making (see chart for details).       Final Clinical Impressions(s) / ED Diagnoses MDM  Vital signs reviewed.  Patient has a history of lower back issues.  She has had surgery on her back in the past.  She says over the past month her back pain is been getting progressively worse.  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  No evidence for cauda equina or other emergent changes.  No reported foot drop.  The patient also has problem with the right knee.  No injury to the right knee.  She says that she does a lot of standing and  walking and lifting.  X-ray of the knee is negative for fracture, dislocation, or effusion.  It was suggested by radiology that she have an MR of her knee for further evaluation.  The patient states that she does not have insurance and cannot have this test done at this time, neither does she feel comfortable trying to pursue a orthopedic specialist.  Patient given the resource information for the [facility] clinic, as well as the [facility].  Prescription for Flexeril and steroid medication given to the patient.  The patient is to use extra strength Tylenol for pain.  Patient is given 12 tablets of Ultram for more severe pain.  Patient acknowledges understanding of these instructions.   Final diagnoses:  Chronic midline low back pain with right-sided sciatica  Acute pain of right knee    ED Discharge Orders         Ordered    cyclobenzaprine (FLEXERIL) 10 MG tablet  3 times daily     04/04/18 1709    ibuprofen (ADVIL,MOTRIN) 600 MG tablet  4 times daily,   Status:  Discontinued     04/04/18 1709    dexamethasone (DECADRON) 4 MG tablet  2 times daily with meals     04/04/18 1709    traMADol (ULTRAM) 50 MG tablet  Every 6 hours PRN     04/04/18 1717           Lily Kocher, PA-C 04/04/18 1722    Nat Christen, MD 04/05/18 2232

## 2018-04-04 NOTE — ED Triage Notes (Addendum)
Pt reports chronic back pain but reports "flare up" today while at work. Pt able to ambulate but with pain. Pt denies any gi/gu symptoms.  Pt also reports right knee pain. Pt denies any known injury.

## 2018-04-04 NOTE — Discharge Instructions (Addendum)
Your knee x-ray is negative for fracture or dislocation or fluid in the joint.  Given your symptoms, it is the recommendation that you are seen by 1 of the orthopedic specialist, and have an MRI of your knee.  There are no gross neurologic deficits appreciated concerning your lower back.  It is recommended that you see the orthopedic specialist concerning your back as well, especially since you have had surgery on your back in the past.  Please use a heating pad to your back.  Please use Flexeril 3 times daily for spasm pain.  This medication may cause drowsiness, please use it with caution.  Please do not drive a vehicle, operate machinery, or participate in activities requiring concentration.  Please use Ultram for pain not improved by tylenol extra strength. Please use Decadron 2 times daily with food.  Please call the [facility], or Dr. Maudie Mercury at the [facility] to establish a primary physician and for assistance with your pain management.

## 2018-05-02 ENCOUNTER — Emergency Department (HOSPITAL_COMMUNITY)
Admission: EM | Admit: 2018-05-02 | Discharge: 2018-05-02 | Disposition: A | Discharge status: Home / Self Care | Payer: Self-pay | Attending: Emergency Medicine | Admitting: Emergency Medicine

## 2018-05-02 ENCOUNTER — Other Ambulatory Visit: Payer: Self-pay

## 2018-05-02 ENCOUNTER — Encounter (HOSPITAL_COMMUNITY): Payer: Self-pay | Admitting: Emergency Medicine

## 2018-05-02 ENCOUNTER — Emergency Department (HOSPITAL_COMMUNITY): Payer: Self-pay

## 2018-05-02 DIAGNOSIS — F17210 Nicotine dependence, cigarettes, uncomplicated: Secondary | ICD-10-CM | POA: Insufficient documentation

## 2018-05-02 DIAGNOSIS — Z79899 Other long term (current) drug therapy: Secondary | ICD-10-CM | POA: Insufficient documentation

## 2018-05-02 DIAGNOSIS — Y929 Unspecified place or not applicable: Secondary | ICD-10-CM | POA: Insufficient documentation

## 2018-05-02 DIAGNOSIS — Y9301 Activity, walking, marching and hiking: Secondary | ICD-10-CM | POA: Insufficient documentation

## 2018-05-02 DIAGNOSIS — S92351A Displaced fracture of fifth metatarsal bone, right foot, initial encounter for closed fracture: Secondary | ICD-10-CM | POA: Insufficient documentation

## 2018-05-02 DIAGNOSIS — Y999 Unspecified external cause status: Secondary | ICD-10-CM | POA: Insufficient documentation

## 2018-05-02 DIAGNOSIS — X509XXA Other and unspecified overexertion or strenuous movements or postures, initial encounter: Secondary | ICD-10-CM | POA: Insufficient documentation

## 2018-05-02 MED ORDER — OXYCODONE-ACETAMINOPHEN 5-325 MG PO TABS: 1.0000 | ORAL_TABLET | ORAL | 0 refills | Status: DC | PRN

## 2018-05-02 MED ORDER — OXYCODONE-ACETAMINOPHEN 5-325 MG PO TABS
1.0000 | ORAL_TABLET | Freq: Once | ORAL | Status: AC
Start: 2018-05-02 — End: 2018-05-02
  Administered 2018-05-02: 1 via ORAL
  Filled 2018-05-02: qty 1

## 2018-05-02 MED ORDER — IBUPROFEN 400 MG PO TABS
400.0000 mg | ORAL_TABLET | Freq: Once | ORAL | Status: AC
  Administered 2018-05-02: 400 mg via ORAL
  Filled 2018-05-02: qty 1

## 2018-05-02 NOTE — ED Triage Notes (Signed)
PT states she was walking last night and rolled her right ankle causes pain and swelling to right ankle and foot since. PT unable to bear weight.

## 2018-05-02 NOTE — Discharge Instructions (Signed)
Take ibuprofen 400 mg every 6 hours as needed for pain in additional to percocet as needed. Follow-up with Dr Aline Brochure as soon as you can.

## 2018-05-08 ENCOUNTER — Encounter: Payer: Self-pay | Admitting: Physician Assistant

## 2018-05-08 ENCOUNTER — Ambulatory Visit: Payer: Medicaid Other | Admitting: Physician Assistant

## 2018-05-08 VITALS — BP 155/94 | HR 90 | Temp 98.9°F

## 2018-05-08 DIAGNOSIS — Z7689 Persons encountering health services in other specified circumstances: Secondary | ICD-10-CM

## 2018-05-08 DIAGNOSIS — E876 Hypokalemia: Secondary | ICD-10-CM

## 2018-05-08 DIAGNOSIS — F17200 Nicotine dependence, unspecified, uncomplicated: Secondary | ICD-10-CM

## 2018-05-08 DIAGNOSIS — S92354D Nondisplaced fracture of fifth metatarsal bone, right foot, subsequent encounter for fracture with routine healing: Secondary | ICD-10-CM

## 2018-05-08 DIAGNOSIS — R030 Elevated blood-pressure reading, without diagnosis of hypertension: Secondary | ICD-10-CM

## 2018-05-08 DIAGNOSIS — K219 Gastro-esophageal reflux disease without esophagitis: Secondary | ICD-10-CM

## 2018-05-08 DIAGNOSIS — Z13220 Encounter for screening for lipoid disorders: Secondary | ICD-10-CM

## 2018-05-08 MED ORDER — HYDROCODONE-ACETAMINOPHEN 10-325 MG PO TABS: 1.0000 | ORAL_TABLET | Freq: Three times a day (TID) | ORAL | 0 refills | Status: AC | PRN

## 2018-05-08 MED ORDER — IBUPROFEN 600 MG PO TABS: 600.0000 mg | ORAL_TABLET | Freq: Three times a day (TID) | ORAL | 1 refills | Status: DC | PRN

## 2018-05-08 NOTE — Progress Notes (Signed)
BP (!) 155/94   Pulse 90   Temp 98.9 F (37.2 C)   LMP 04/11/2018   SpO2 98%    Subjective:    Patient ID: Olivia Lester, female    DOB: July 20, 1976, 42 y.o.   MRN: [id number]  HPI: Olivia Lester is a 42 y.o. female presenting on 05/08/2018 for New Patient (Initial Visit)   HPI   Pt was seen in ER on 05/02/18 but no MD note in Epic at this time.  Pt says her knee was hurting and so she rolled her foot and broke it.   Xray reviewed- proximal 5th MT fracture.  Pt is a Educational psychologist at [employer].   Pt seen in ER last month for chronic back and knee pain.  Hx back surgery.   She was inpatient in June for gastric ulcer and dental abscess.  She has history of hepatitis C which was treated while she was incarcerated. .    Relevant past medical, surgical, family and social history reviewed and updated as indicated. Interim medical history since our last visit reviewed. Allergies and medications reviewed and updated.   Current Outpatient Medications:  .  ibuprofen (ADVIL,MOTRIN) 200 MG tablet, Take 200 mg by mouth every 6 (six) hours as needed., Disp: , Rfl:  .  pantoprazole (PROTONIX) 40 MG tablet, Take 40 mg by mouth daily., Disp: , Rfl:  .  Potassium 99 MG TABS, Take by mouth., Disp: , Rfl:    Review of Systems  Constitutional: Negative for appetite change, chills, diaphoresis, fatigue, fever and unexpected weight change.  HENT: Positive for congestion, dental problem and hearing loss. Negative for drooling, ear pain, facial swelling, mouth sores, sneezing, sore throat, trouble swallowing and voice change.   Eyes: Negative for pain, discharge, redness, itching and visual disturbance.  Respiratory: Negative for cough, choking, shortness of breath and wheezing.   Cardiovascular: Positive for leg swelling. Negative for chest pain and palpitations.  Gastrointestinal: Negative for abdominal pain, blood in stool, constipation, diarrhea and vomiting.  Endocrine: Negative for cold intolerance, heat  intolerance and polydipsia.  Genitourinary: Positive for decreased urine volume. Negative for dysuria and hematuria.  Musculoskeletal: Positive for arthralgias, back pain and gait problem.  Skin: Negative for rash.  Allergic/Immunologic: Positive for environmental allergies.  Neurological: Positive for headaches. Negative for seizures, syncope and light-headedness.  Hematological: Negative for adenopathy.  Psychiatric/Behavioral: Negative for agitation, dysphoric mood and suicidal ideas. The patient is not nervous/anxious.     Per HPI unless specifically indicated above     Objective:    BP (!) 155/94   Pulse 90   Temp 98.9 F (37.2 C)   LMP 04/11/2018   SpO2 98%   Wt Readings from Last 3 Encounters:  05/02/18 110 lb (49.9 kg)  04/04/18 105 lb (47.6 kg)  12/25/17 96 lb 5.5 oz (43.7 kg)    Physical Exam  Constitutional: She is oriented to person, place, and time. She appears well-developed and well-nourished.  HENT:  Head: Normocephalic and atraumatic.  Mouth/Throat: Oropharynx is clear and moist. No oropharyngeal exudate.  Eyes: Pupils are equal, round, and reactive to light. Conjunctivae and EOM are normal.  Neck: Neck supple. No thyromegaly present.  Cardiovascular: Normal rate and regular rhythm.  Pulmonary/Chest: Effort normal and breath sounds normal.  Abdominal: Soft. Bowel sounds are normal. She exhibits no mass. There is no hepatosplenomegaly. There is no tenderness.  Musculoskeletal: She exhibits no edema.       Right foot: There is tenderness and swelling.  Feet:  Bruising and tenderness.  + dp pulse  Lymphadenopathy:    She has no cervical adenopathy.  Neurological: She is alert and oriented to person, place, and time. Gait normal.  Skin: Skin is warm and dry.  Psychiatric: She has a normal mood and affect. Her behavior is normal.  Vitals reviewed.       Assessment & Plan:   Encounter Diagnoses  Name Primary?  . Encounter to establish care Yes    . Closed nondisplaced fracture of fifth metatarsal bone of right foot with routine healing, subsequent encounter   . Hypokalemia   . Screening cholesterol level   . Gastroesophageal reflux disease, esophagitis presence not specified   . Tobacco use disorder   . Elevated blood pressure reading     -bp 130 / 62 in feb when getting PAP.  Will recheck bp at follow up appointment.  No Rx today -will check fasting Lipids, CMP -pt referred to orthopedics for foot fracture.   She is to continue to wear cast boot and use crutches.  -pt is given 7 hydrocodone with discussion that it will not be refilled as [facility] does not typically Rx narcotics.  She is given Rx IBU as well -pt is given application [facility] -counseled smoking cessation -pt to follow up here in 1 month.  RTO sooner prn

## 2018-05-09 ENCOUNTER — Ambulatory Visit: Payer: Self-pay | Admitting: Orthopaedic Surgery

## 2018-05-09 ENCOUNTER — Encounter: Payer: Self-pay | Admitting: Orthopaedic Surgery

## 2018-05-09 VITALS — BP 170/101 | HR 95 | Ht 65.5 in | Wt 110.0 lb

## 2018-05-09 DIAGNOSIS — F17210 Nicotine dependence, cigarettes, uncomplicated: Secondary | ICD-10-CM

## 2018-05-09 DIAGNOSIS — S92351A Displaced fracture of fifth metatarsal bone, right foot, initial encounter for closed fracture: Secondary | ICD-10-CM

## 2018-05-09 NOTE — Patient Instructions (Signed)
Smoking Tobacco Information Smoking tobacco will very likely harm your health. Tobacco contains a poisonous (toxic), colorless chemical called nicotine. Nicotine affects the brain and makes tobacco addictive. This change in your brain can make it hard to stop smoking. Tobacco also has other toxic chemicals that can hurt your body and raise your risk of many cancers. How can smoking tobacco affect me? Smoking tobacco can increase your chances of having serious health conditions, such as:  Cancer. Smoking is most commonly associated with lung cancer, but can lead to cancer in other parts of the body.  Chronic obstructive pulmonary disease (COPD). This is a long-term lung condition that makes it hard to breathe. It also gets worse over time.  High blood pressure (hypertension), heart disease, stroke, or heart attack.  Lung infections, such as pneumonia.  Cataracts. This is when the lenses in the eyes become clouded.  Digestive problems. This may include peptic ulcers, heartburn, and gastroesophageal reflux disease (GERD).  Oral health problems, such as gum disease and tooth loss.  Loss of taste and smell.  Smoking can affect your appearance by causing:  Wrinkles.  Yellow or stained teeth, fingers, and fingernails.  Smoking tobacco can also affect your social life.  Many workplaces, restaurants, hotels, and public places are tobacco-free. This means that you may experience challenges in finding places to smoke when away from home.  The cost of a smoking habit can be expensive. Expenses for someone who smokes come in two ways: ? You spend money on a regular basis to buy tobacco. ? Your health care costs in the long-term are higher if you smoke.  Tobacco smoke can also affect the health of those around you. Children of smokers have greater chances of: ? Sudden infant death syndrome (SIDS). ? Ear infections. ? Lung infections.  What lifestyle changes can be made?  Do not start  smoking. Quit if you already do.  To quit smoking: ? Make a plan to quit smoking and commit yourself to it. Look for programs to help you and ask your health care provider for recommendations and ideas. ? Talk with your health care provider about using nicotine replacement medicines to help you quit. Medicine replacement medicines include gum, lozenges, patches, sprays, or pills. ? Do not replace cigarette smoking with electronic cigarettes, which are commonly called e-cigarettes. The safety of e-cigarettes is not known, and some may contain harmful chemicals. ? Avoid places, people, or situations that tempt you to smoke. ? If you try to quit but return to smoking, don't give up hope. It is very common for people to try a number of times before they fully succeed. When you feel ready again, give it another try.  Quitting smoking might affect the way you eat as well as your weight. Be prepared to monitor your eating habits. Get support in planning and following a healthy diet.  Ask your health care provider about having regular tests (screenings) to check for cancer. This may include blood tests, imaging tests, and other tests.  Exercise regularly. Consider taking walks, joining a gym, or doing yoga or exercise classes.  Develop skills to manage your stress. These skills include meditation. What are the benefits of quitting smoking? By quitting smoking, you may:  Lower your risk of getting cancer and other diseases caused by smoking.  Live longer.  Breathe better.  Lower your blood pressure and heart rate.  Stop your addiction to tobacco.  Stop creating secondhand smoke that hurts other people.  Improve your   sense of taste and smell.  Look better over time, due to having fewer wrinkles and less staining.  What can happen if changes are not made? If you do not stop smoking, you may:  Get cancer and other diseases.  Develop COPD or other long-term (chronic) lung  conditions.  Develop serious problems with your heart and blood vessels (cardiovascular system).  Need more tests to screen for problems caused by smoking.  Have higher, long-term healthcare costs from medicines or treatments related to smoking.  Continue to have worsening changes in your lungs, mouth, and nose.  Where to find support: To get support to quit smoking, consider:  Asking your health care provider for more information and resources.  Taking classes to learn more about quitting smoking.  Looking for local organizations that offer resources about quitting smoking.  Joining a support group for people who want to quit smoking in your local community.  Where to find more information: You may find more information about quitting smoking from:  HelpGuide.org: www.helpguide.org/articles/addictions/how-to-quit-smoking.htm  https://hall.com/: smokefree.gov  American Lung Association: www.lung.org  Contact a health care provider if:  You have problems breathing.  Your lips, nose, or fingers turn blue.  You have chest pain.  You are coughing up blood.  You feel faint or you pass out.  You have other noticeable changes that cause you to worry. Summary  Smoking tobacco can negatively affect your health, the health of those around you, your finances, and your social life.  Do not start smoking. Quit if you already do. If you need help quitting, ask your health care provider.  Think about joining a support group for people who want to quit smoking in your local community. There are many effective programs that will help you to quit this behavior. This information is not intended to replace advice given to you by your health care provider. Make sure you discuss any questions you have with your health care provider. Document Released: 07/26/2016 Document Revised: 07/26/2016 Document Reviewed: 07/26/2016 Elsevier Interactive Patient Education  2018 [place] or  Splint Care, Adult Casts and splints are supports that are worn to protect broken bones and other injuries. A cast or splint may hold a bone still and in the correct position while it heals. Casts and splints may also help to ease pain, swelling, and muscle spasms. How to care for your cast  Do not stick anything inside the cast to scratch your skin.  Check the skin around the cast every day. Tell your doctor about any concerns.  You may put lotion on dry skin around the edges of the cast. Do not put lotion on the skin under the cast.  Keep the cast clean.  If the cast is not waterproof: ? Do not let it get wet. ? Cover it with a watertight covering when you take a bath or a shower. How to care for your splint  Wear it as told by your doctor. Take it off only as told by your doctor.  Loosen the splint if your fingers or toes tingle, get numb, or turn cold and blue.  Keep the splint clean.  If the splint is not waterproof: ? Do not let it get wet. ? Cover it with a watertight covering when you take a bath or a shower. Follow these instructions at home: Bathing  Do not take baths or swim until your doctor says it is okay. Ask your doctor if you can take showers. You may only be  allowed to take sponge baths for bathing.  If your cast or splint is not waterproof, cover it with a watertight covering when you take a bath or shower. Managing pain, stiffness, and swelling  Move your fingers or toes often to avoid stiffness and to lessen swelling.  Raise (elevate) the injured area above the level of your heart while sitting or lying down. Safety  Do not use the injured limb to support your body weight until your doctor says that it is okay.  Use crutches or other assistive devices as told by your doctor. General instructions  Do not put pressure on any part of the cast or splint until it is fully hardened. This may take many hours.  Return to your normal activities as told by  your doctor. Ask your doctor what activities are safe for you.  Keep all follow-up visits as told by your doctor. This is important. Contact a doctor if:  Your cast or splint gets damaged.  The skin around the cast gets red or raw.  The skin under the cast is very itchy or painful.  Your cast or splint feels very uncomfortable.  Your cast or splint is too tight or too loose.  Your cast becomes wet or it starts to have a soft spot or area.  You get an object stuck under your cast. Get help right away if:  Your pain gets worse.  The injured area tingles, gets numb, or turns blue and cold.  The part of your body above or below the cast is swollen and it turns a different color (is discolored).  You cannot feel or move your fingers or toes.  There is fluid leaking through the cast.  You have very bad pain or pressure under the cast.  You have trouble breathing.  You have shortness of breath.  You have chest pain. This information is not intended to replace advice given to you by your health care provider. Make sure you discuss any questions you have with your health care provider. Document Released: 11/10/2010 Document Revised: 07/01/2016 Document Reviewed: 07/01/2016 Elsevier Interactive Patient Education  2017 [employer].

## 2018-05-09 NOTE — Progress Notes (Signed)
Subjective:    Patient ID: Olivia Lester, female    DOB: April 11, 1976, 42 y.o.   MRN: [id number]  HPI She had her right knee give way a week ago and she turned her right foot.  She had right foot pain and swelling.  She went to the ER.  X-rays showed: IMPRESSION: Fracture fifth proximal metatarsal with mild separation of fracture fragments. No other fracture. No dislocation. No appreciable Arthropathy.  She was given a CAM walker.  She was given pain medicine and ibuprofen.  She has no other injury.  She has crutches.  She does not like the CAM walker.  She has requested a cast.  I have reviewed the ER records, the x-rays and x-ray report.   Review of Systems  Constitutional: Positive for activity change.  Musculoskeletal: Positive for arthralgias, back pain, gait problem and joint swelling.  Psychiatric/Behavioral: The patient is nervous/anxious.   All other systems reviewed and are negative.  For Review of Systems, all other systems reviewed and are negative.  The following is a summary of the past history medically, past history surgically, known current medicines, social history and family history.  This information is gathered electronically by the computer from prior information and documentation.  I review this each visit and have found including this information at this point in the chart is beneficial and informative.   Past Medical History:  Diagnosis Date  . Back injury   . Gastric ulcer   . Gastric ulcer 10/14/2013  . Hepatitis C 10/14/2013  . Panic attack     Past Surgical History:  Procedure Laterality Date  . BACK SURGERY    . BREAST SURGERY    . ESOPHAGOGASTRODUODENOSCOPY (EGD) WITH PROPOFOL N/A 10/14/2013   Procedure: ESOPHAGOGASTRODUODENOSCOPY (EGD) WITH PROPOFOL;  Surgeon: Rogene Houston, MD;  Location: [facility];  Service: Endoscopy;  Laterality: N/A;  . TONSILLECTOMY      Current Outpatient Medications on File Prior to Visit  Medication Sig Dispense Refill    . HYDROcodone-acetaminophen (NORCO) 10-325 MG tablet Take 1 tablet by mouth every 8 (eight) hours as needed for up to 3 days. 7 tablet 0  . ibuprofen (ADVIL,MOTRIN) 600 MG tablet Take 1 tablet (600 mg total) by mouth every 8 (eight) hours as needed. 30 tablet 1  . pantoprazole (PROTONIX) 40 MG tablet Take 40 mg by mouth daily.    . Potassium 99 MG TABS Take by mouth.     No current facility-administered medications on file prior to visit.     Social History   Socioeconomic History  . Marital status: Divorced    Spouse name: Not on file  . Number of children: Not on file  . Years of education: Not on file  . Highest education level: Not on file  Occupational History  . Not on file  Social Needs  . Financial resource strain: Not on file  . Food insecurity:    Worry: Not on file    Inability: Not on file  . Transportation needs:    Medical: Not on file    Non-medical: Not on file  Tobacco Use  . Smoking status: Current Every Day Smoker    Packs/day: 1.00    Years: 20.00    Pack years: 20.00    Types: Cigarettes  . Smokeless tobacco: Never Used  Substance and Sexual Activity  . Alcohol use: Not Currently    Frequency: Never    Comment: hx heavy etoh  . Drug use: No  Types: Marijuana    Comment: hx marijuana use   . Sexual activity: Not Currently    Birth control/protection: None  Lifestyle  . Physical activity:    Days per week: Not on file    Minutes per session: Not on file  . Stress: Not on file  Relationships  . Social connections:    Talks on phone: Not on file    Gets together: Not on file    Attends religious service: Not on file    Active member of club or organization: Not on file    Attends meetings of clubs or organizations: Not on file    Relationship status: Not on file  . Intimate partner violence:    Fear of current or ex partner: Not on file    Emotionally abused: Not on file    Physically abused: Not on file    Forced sexual activity: Not on  file  Other Topics Concern  . Not on file  Social History Narrative  . Not on file    Family History  Problem Relation Age of Onset  . Cancer Mother 38       ovarian   . Hypertension Mother   . Stroke Mother   . Early death Father   . Alcohol abuse Father     BP (!) 170/101   Pulse 95   Ht 5' 5.5" (1.664 m)   Wt 110 lb (49.9 kg)   LMP 04/11/2018   BMI 18.03 kg/m   Body mass index is 18.03 kg/m.     Objective:   Physical Exam  Constitutional: She is oriented to person, place, and time. She appears well-developed and well-nourished.  HENT:  Head: Normocephalic and atraumatic.  Eyes: Pupils are equal, round, and reactive to light. Conjunctivae and EOM are normal.  Neck: Normal range of motion. Neck supple.  Cardiovascular: Normal rate, regular rhythm and intact distal pulses.  Pulmonary/Chest: Effort normal.  Abdominal: Soft.  Musculoskeletal:       Right foot: There is bony tenderness.       Feet:  Neurological: She is alert and oriented to person, place, and time. She has normal reflexes. She displays normal reflexes. No cranial nerve deficit. She exhibits normal muscle tone. Coordination normal.  Skin: Skin is warm and dry.  Psychiatric: She has a normal mood and affect. Her behavior is normal. Judgment and thought content normal.          Assessment & Plan:   Encounter Diagnoses  Name Primary?  . Closed fracture of base of fifth metatarsal bone of right foot, initial encounter Yes  . Cigarette nicotine dependence without complication    A short leg cast applied per her request.  Cast precautions given.  Return in three weeks.  X-rays out of the cast on return.  Call if any problem.  Precautions discussed.   Electronically Signed Sanjuana Kava, MD 10/16/20198:36 AM

## 2018-05-14 NOTE — ED Provider Notes (Signed)
[facility] EMERGENCY DEPARTMENT Provider Note   CSN: [id number] Arrival date & time: 05/02/18  3151     History   Chief Complaint Chief Complaint  Patient presents with  . Ankle Pain    HPI Olivia Lester is a 42 y.o. female.  HPI   42 year old female with right ankle/foot pain.  She was walking last night when stepped awkwardly and rolled her right ankle.  She is had persistent pain and swelling since then.  She cannot bear weight because of the pain.  Denies any other acute injuries.  Past Medical History:  Diagnosis Date  . Back injury   . Gastric ulcer   . Gastric ulcer 10/14/2013  . Hepatitis C 10/14/2013  . Panic attack     Patient Active Problem List   Diagnosis Date Noted  . Dental abscess 12/25/2017  . Gastric ulcer 10/14/2013  . Transaminitis 10/14/2013  . Tobacco dependence 10/14/2013  . Hepatitis C 10/14/2013  . Thrombocytopenia ([place]) 10/13/2013  . Duodenitis 10/12/2013  . Alcoholism ([place]) 10/12/2013  . Abdominal pain, epigastric 10/12/2013  . ANKLE PAIN 01/01/2008    Past Surgical History:  Procedure Laterality Date  . BACK SURGERY    . BREAST SURGERY    . ESOPHAGOGASTRODUODENOSCOPY (EGD) WITH PROPOFOL N/A 10/14/2013   Procedure: ESOPHAGOGASTRODUODENOSCOPY (EGD) WITH PROPOFOL;  Surgeon: Rogene Houston, MD;  Location: [facility];  Service: Endoscopy;  Laterality: N/A;  . TONSILLECTOMY       OB History    Gravida  7   Para  3   Term  3   Preterm      AB  4   Living  3     SAB  2   TAB  2   Ectopic      Multiple      Live Births  3            Home Medications    Prior to Admission medications   Medication Sig Start Date End Date Taking? Authorizing Provider  ibuprofen (ADVIL,MOTRIN) 600 MG tablet Take 1 tablet (600 mg total) by mouth every 8 (eight) hours as needed. 05/08/18   Soyla Dryer, PA-C  pantoprazole (PROTONIX) 40 MG tablet Take 40 mg by mouth daily.    [provider]  Potassium 99 MG TABS Take by mouth.     [provider]    Family History Family History  Problem Relation Age of Onset  . Cancer Mother 76       ovarian   . Hypertension Mother   . Stroke Mother   . Early death Father   . Alcohol abuse Father     Social History Social History   Tobacco Use  . Smoking status: Current Every Day Smoker    Packs/day: 1.00    Years: 20.00    Pack years: 20.00    Types: Cigarettes  . Smokeless tobacco: Never Used  Substance Use Topics  . Alcohol use: Not Currently    Frequency: Never    Comment: hx heavy etoh  . Drug use: No    Types: Marijuana    Comment: hx marijuana use      Allergies   Patient has no known allergies.   Review of Systems Review of Systems  All systems reviewed and negative, other than as noted in HPI.  Physical Exam Updated Vital Signs BP (!) 158/99 (BP Location: Left Arm)   Pulse 83   Temp 98 F (36.7 C) (Oral)   Resp 18  Ht 5\' 7"  (1.702 m)   Wt 49.9 kg   LMP 04/11/2018   SpO2 100%   BMI 17.23 kg/m   Physical Exam  Constitutional: She appears well-developed and well-nourished. No distress.  HENT:  Head: Normocephalic and atraumatic.  Eyes: Conjunctivae are normal. Right eye exhibits no discharge. Left eye exhibits no discharge.  Neck: Neck supple.  Cardiovascular: Normal rate, regular rhythm and normal heart sounds. Exam reveals no gallop and no friction rub.  No murmur heard. Pulmonary/Chest: Effort normal and breath sounds normal. No respiratory distress.  Abdominal: Soft. She exhibits no distension. There is no tenderness.  Musculoskeletal:  Swelling and faint ecchymosis of the right ankle.  Tenderness along the lateral malleolus across the talar dome and along the base of the fourth/fifth metatarsals.  Closed injury.  Neurovascular intact.  Neurological: She is alert.  Skin: Skin is warm and dry.  Psychiatric: She has a normal mood and affect. Her behavior is normal. Thought content normal.  Nursing note and vitals  reviewed.    ED Treatments / Results  Labs (all labs ordered are listed, but only abnormal results are displayed) Labs Reviewed - No data to display  EKG None  Radiology No results found.   Dg Ankle Complete Right  Addendum Date: 05/02/2018   ADDENDUM REPORT: 05/02/2018 09:28 ADDENDUM: Comment: There is a fracture involving the proximal fifth metatarsal, better seen on foot images. Electronically Signed   By: Lowella Grip III M.D.   On: 05/02/2018 09:28   Result Date: 05/02/2018 CLINICAL DATA:  Pain and swelling following rolling injury EXAM: RIGHT ANKLE - COMPLETE 3+ VIEW COMPARISON:  June 07, 2015 FINDINGS: Frontal, oblique, and lateral views obtained. Calcification inferior to the lateral malleolus is consistent with a prior injury. There is soft tissue swelling laterally. There is no evident acute fracture or appreciable joint effusion. There is no appreciable joint space narrowing or erosion. The ankle mortise appears normal. IMPRESSION: Soft tissue swelling laterally. Calcification inferior to the lateral malleolus is indicative of prior injury. No acute fracture evident. No appreciable arthropathy. Ankle mortise appears intact. Electronically Signed: By: Lowella Grip III M.D. On: 05/02/2018 09:23   Dg Foot Complete Right  Result Date: 05/02/2018 CLINICAL DATA:  Pain following rolling type injury EXAM: RIGHT FOOT COMPLETE - 3+ VIEW COMPARISON:  June 07, 2015 FINDINGS: Frontal, oblique, and lateral views were obtained. There is a fracture along the proximal aspect of the fifth metatarsal with slight separation of fracture fragments. No other fracture evident. No dislocation. Joint spaces appear normal. No erosive change. IMPRESSION: Fracture fifth proximal metatarsal with mild separation of fracture fragments. No other fracture. No dislocation. No appreciable arthropathy. Electronically Signed   By: Lowella Grip III M.D.   On: 05/02/2018 09:27     Procedures Procedures (including critical care time)  Medications Ordered in ED Medications  oxyCODONE-acetaminophen (PERCOCET/ROXICET) 5-325 MG per tablet 1 tablet (1 tablet Oral Given 05/02/18 0925)  ibuprofen (ADVIL,MOTRIN) tablet 400 mg (400 mg Oral Given 05/02/18 0925)     Initial Impression / Assessment and Plan / ED Course  I have reviewed the triage vital signs and the nursing notes.  Pertinent labs & imaging results that were available during my care of the patient were reviewed by me and considered in my medical decision making (see chart for details).     Fifth metatarsal fracture.  Splint.  Crutches.  PRN pain medication.  Orthopedic follow-up.  Final Clinical Impressions(s) / ED Diagnoses   Final diagnoses:  Displaced fracture of fifth metatarsal bone, right foot, initial encounter for closed fracture    ED Discharge Orders         Ordered    oxyCODONE-acetaminophen (PERCOCET/ROXICET) 5-325 MG tablet  Every 4 hours PRN,   Status:  Discontinued     05/02/18 8889           Virgel Manifold, MD 05/14/18 1706

## 2018-05-16 ENCOUNTER — Ambulatory Visit (INDEPENDENT_AMBULATORY_CARE_PROVIDER_SITE_OTHER): Payer: Medicaid Other

## 2018-05-16 ENCOUNTER — Ambulatory Visit (INDEPENDENT_AMBULATORY_CARE_PROVIDER_SITE_OTHER): Payer: Self-pay | Admitting: Orthopedic Surgery

## 2018-05-16 ENCOUNTER — Encounter: Payer: Self-pay | Admitting: Orthopaedic Surgery

## 2018-05-16 VITALS — BP 140/89 | HR 73 | Ht 67.0 in | Wt 105.0 lb

## 2018-05-16 DIAGNOSIS — S92351A Displaced fracture of fifth metatarsal bone, right foot, initial encounter for closed fracture: Secondary | ICD-10-CM

## 2018-05-16 MED ORDER — IBUPROFEN 600 MG PO TABS: 600.0000 mg | ORAL_TABLET | Freq: Three times a day (TID) | ORAL | 1 refills | Status: DC | PRN

## 2018-05-16 NOTE — Progress Notes (Signed)
Fracture care  Right avulsion fracture proximal fifth metatarsal patient comes in with cast with hole in the heel  She says it did pop last night.  Cast is removed  X-ray obtained  New cast applied follow-up as previously noted  Encounter Diagnosis  Name Primary?  . Closed fracture of base of fifth metatarsal bone of right foot, initial encounter Yes   Meds ordered this encounter  Medications  . DISCONTD: ibuprofen (ADVIL,MOTRIN) 600 MG tablet    Sig: Take 1 tablet (600 mg total) by mouth every 8 (eight) hours as needed.    Dispense:  30 tablet    Refill:  1  . ibuprofen (ADVIL,MOTRIN) 600 MG tablet    Sig: Take 1 tablet (600 mg total) by mouth every 8 (eight) hours as needed.    Dispense:  30 tablet    Refill:  1    FU AS PREVIOUS

## 2018-05-30 ENCOUNTER — Ambulatory Visit (INDEPENDENT_AMBULATORY_CARE_PROVIDER_SITE_OTHER): Payer: Medicaid Other

## 2018-05-30 ENCOUNTER — Other Ambulatory Visit: Payer: Medicaid Other

## 2018-05-30 ENCOUNTER — Encounter: Payer: Self-pay | Admitting: Orthopaedic Surgery

## 2018-05-30 ENCOUNTER — Ambulatory Visit (INDEPENDENT_AMBULATORY_CARE_PROVIDER_SITE_OTHER): Payer: Self-pay | Admitting: Orthopaedic Surgery

## 2018-05-30 DIAGNOSIS — S92351D Displaced fracture of fifth metatarsal bone, right foot, subsequent encounter for fracture with routine healing: Secondary | ICD-10-CM

## 2018-05-30 DIAGNOSIS — S92351A Displaced fracture of fifth metatarsal bone, right foot, initial encounter for closed fracture: Secondary | ICD-10-CM

## 2018-05-30 NOTE — Patient Instructions (Signed)
May return to work full time next week.

## 2018-05-30 NOTE — Progress Notes (Signed)
CC:  My foot is better  She has been using the CAM walker.  She has no problem.  NV intact.  X-rays of the right foot were done today, reported separately.  Encounter Diagnoses  Name Primary?  . Closed fracture of base of fifth metatarsal bone of right foot Yes  . Closed fracture of base of fifth metatarsal bone of right foot with routine healing    Return in one month.  She can come out of the CAM walker as needed.  X-rays on return of the right foot.  She may return to work full time.  Call if any problem.  Precautions discussed.   Electronically Signed Sanjuana Kava, MD 11/6/20199:44 AM

## 2018-06-06 ENCOUNTER — Ambulatory Visit: Payer: Medicaid Other | Admitting: Physician Assistant

## 2018-06-18 ENCOUNTER — Telehealth: Payer: Self-pay | Admitting: Orthopedic Surgery

## 2018-06-18 NOTE — Telephone Encounter (Signed)
Continued - states rolled the ankle in parking lot at work, on way back into work. Feels she re-injured the same ankle. Offered appointment, but thinks she may have to go on to the Emergency room. Please advise.

## 2018-06-18 NOTE — Telephone Encounter (Signed)
Patient called, states "rolled ankle" - same one as recently treated for fracture. I called back to further discuss, phone # does not have voice mail set up.  Will keep trying.

## 2018-06-18 NOTE — Telephone Encounter (Signed)
Called patient following speaking with Dr Aline Brochure. Scheduled for tomorrow 06/19/18 at 1:30pm; patient aware.

## 2018-06-18 NOTE — Telephone Encounter (Signed)
Ok , her choice, come here or go to ER

## 2018-06-19 ENCOUNTER — Telehealth: Payer: Self-pay | Admitting: Orthopedic Surgery

## 2018-06-19 ENCOUNTER — Ambulatory Visit: Payer: Self-pay | Admitting: Orthopedic Surgery

## 2018-06-19 NOTE — Telephone Encounter (Signed)
Per Dr Aline Brochure:  Carole Civil, MD sent to [place] AT 3 ???  IF NOT [place] back to patient 3:37pm today, 06/19/18 to offer this appointment option for tomorrow,  per Dr Aline Brochure since she was unable to come today due to work; no voice mail set up. Tried back at 4:07pm, same, no answer and no option to leave a voice message.

## 2018-06-20 NOTE — Telephone Encounter (Signed)
No further response from patient.

## 2018-06-27 ENCOUNTER — Encounter: Payer: Self-pay | Admitting: Orthopaedic Surgery

## 2018-06-27 ENCOUNTER — Encounter: Payer: Self-pay | Admitting: Physician Assistant

## 2018-06-27 ENCOUNTER — Ambulatory Visit (INDEPENDENT_AMBULATORY_CARE_PROVIDER_SITE_OTHER): Payer: Self-pay | Admitting: Orthopaedic Surgery

## 2018-06-27 ENCOUNTER — Ambulatory Visit (INDEPENDENT_AMBULATORY_CARE_PROVIDER_SITE_OTHER): Payer: Medicaid Other

## 2018-06-27 VITALS — Ht 65.0 in | Wt 105.0 lb

## 2018-06-27 DIAGNOSIS — S92351D Displaced fracture of fifth metatarsal bone, right foot, subsequent encounter for fracture with routine healing: Secondary | ICD-10-CM

## 2018-06-27 DIAGNOSIS — S92351G Displaced fracture of fifth metatarsal bone, right foot, subsequent encounter for fracture with delayed healing: Secondary | ICD-10-CM

## 2018-06-27 DIAGNOSIS — F17210 Nicotine dependence, cigarettes, uncomplicated: Secondary | ICD-10-CM

## 2018-06-27 MED ORDER — HYDROCODONE-ACETAMINOPHEN 5-325 MG PO TABS: ORAL_TABLET | ORAL | 0 refills | Status: DC

## 2018-06-27 NOTE — Progress Notes (Signed)
CC:  My foot still hurts  She has pain of the right lateral foot.  She has no new trauma.  X-rays were done of the right foot today, reported separately.  There is no complete healing of the fracture.  She has delayed healing.  She may develop a non union.  I have shown her the X-rays and explained the findings.  I will have her return in one month.  If there is lack of healing then, it can be considered a nonunion and may need surgery.  She is aware of this.  I will call in pain medicine.  I have reviewed the [place] web site prior to prescribing narcotic medicine for this patient.   Electronically Signed Sanjuana Kava, MD 12/4/20199:27 AM

## 2018-06-27 NOTE — Patient Instructions (Signed)

## 2018-07-03 ENCOUNTER — Telehealth: Payer: Self-pay | Admitting: Orthopaedic Surgery

## 2018-07-03 MED ORDER — HYDROCODONE-ACETAMINOPHEN 5-325 MG PO TABS: ORAL_TABLET | ORAL | 0 refills | Status: DC

## 2018-07-03 NOTE — Telephone Encounter (Signed)
Hydrocodone-Acetaminophen  5/325 mg  Qty  30 Tablets  PATIENT USES [facility] ON [address]

## 2018-07-09 ENCOUNTER — Telehealth: Payer: Self-pay | Admitting: Orthopaedic Surgery

## 2018-07-09 NOTE — Telephone Encounter (Signed)
Patient called and requested refill on Hydrocodone/Acetaminophen 5-325 mgs.  Qty  28       Sig: One tablet every six hours for pain. Limit 7 days.    Patient uses [facility] on [address]

## 2018-07-10 MED ORDER — HYDROCODONE-ACETAMINOPHEN 5-325 MG PO TABS: ORAL_TABLET | ORAL | 0 refills | Status: DC

## 2018-07-17 ENCOUNTER — Other Ambulatory Visit: Payer: Self-pay

## 2018-07-24 MED ORDER — HYDROCODONE-ACETAMINOPHEN 5-325 MG PO TABS: ORAL_TABLET | ORAL | 0 refills | Status: DC

## 2018-07-26 ENCOUNTER — Encounter: Payer: Self-pay | Admitting: Orthopaedic Surgery

## 2018-07-26 ENCOUNTER — Ambulatory Visit: Payer: Self-pay | Admitting: Orthopaedic Surgery

## 2018-07-26 ENCOUNTER — Ambulatory Visit (INDEPENDENT_AMBULATORY_CARE_PROVIDER_SITE_OTHER): Payer: Medicaid Other

## 2018-07-26 DIAGNOSIS — S92351K Displaced fracture of fifth metatarsal bone, right foot, subsequent encounter for fracture with nonunion: Secondary | ICD-10-CM

## 2018-07-26 DIAGNOSIS — S92351D Displaced fracture of fifth metatarsal bone, right foot, subsequent encounter for fracture with routine healing: Secondary | ICD-10-CM

## 2018-07-26 NOTE — Progress Notes (Signed)
Patient Olivia Lester, female DOB:09-26-1975, 43 y.o. ZHG:[id number]  Chief Complaint  Patient presents with  . Foot Injury    Right foot fracture    HPI  Olivia Lester is a 43 y.o. female who continues to have pain in the right foot base of the fifth metatarsal. She works as Educational psychologist.  X-rays done here today show no healing of the fracture.  She injured the foot on 05-02-18.  It is now just short of three months.  I will have her see Dr. Sharol Given for possible surgery.  She is agreeable.  She smokes and I have told her smoking delays bone healing so she needs to cut back.    There is no height or weight on file to calculate BMI.  ROS  Review of Systems  Constitutional: Positive for activity change.  Musculoskeletal: Positive for arthralgias, back pain, gait problem and joint swelling.  Psychiatric/Behavioral: The patient is nervous/anxious.   All other systems reviewed and are negative.   All other systems reviewed and are negative.  The following is a summary of the past history medically, past history surgically, known current medicines, social history and family history.  This information is gathered electronically by the computer from prior information and documentation.  I review this each visit and have found including this information at this point in the chart is beneficial and informative.    Past Medical History:  Diagnosis Date  . Back injury   . Gastric ulcer   . Gastric ulcer 10/14/2013  . Hepatitis C 10/14/2013  . Panic attack     Past Surgical History:  Procedure Laterality Date  . BACK SURGERY    . BREAST SURGERY    . ESOPHAGOGASTRODUODENOSCOPY (EGD) WITH PROPOFOL N/A 10/14/2013   Procedure: ESOPHAGOGASTRODUODENOSCOPY (EGD) WITH PROPOFOL;  Surgeon: Rogene Houston, MD;  Location: [facility];  Service: Endoscopy;  Laterality: N/A;  . TONSILLECTOMY      Family History  Problem Relation Age of Onset  . Cancer Mother 3       ovarian   . Hypertension Mother   .  Stroke Mother   . Early death Father   . Alcohol abuse Father     Social History Social History   Tobacco Use  . Smoking status: Current Every Day Smoker    Packs/day: 1.00    Years: 20.00    Pack years: 20.00    Types: Cigarettes  . Smokeless tobacco: Never Used  Substance Use Topics  . Alcohol use: Not Currently    Frequency: Never    Comment: hx heavy etoh  . Drug use: No    Types: Marijuana    Comment: hx marijuana use     No Known Allergies  Current Outpatient Medications  Medication Sig Dispense Refill  . HYDROcodone-acetaminophen (NORCO/VICODIN) 5-325 MG tablet One tablet every six hours for pain.  Limit 7 days. 20 tablet 0  . ibuprofen (ADVIL,MOTRIN) 600 MG tablet Take 1 tablet (600 mg total) by mouth every 8 (eight) hours as needed. 30 tablet 1  . pantoprazole (PROTONIX) 40 MG tablet Take 40 mg by mouth daily.    . Potassium 99 MG TABS Take by mouth.     No current facility-administered medications for this visit.      Physical Exam  Last menstrual period 07/11/2018.  Constitutional: overall normal hygiene, normal nutrition, well developed, normal grooming, normal body habitus. Assistive device:none  Musculoskeletal: gait and station Limp right, muscle tone and strength are normal, no tremors or atrophy  is present.  .  Neurological: coordination overall normal.  Deep tendon reflex/nerve stretch intact.  Sensation normal.  Cranial nerves II-XII intact.   Skin:   Normal overall no scars, lesions, ulcers or rashes. No psoriasis.  Psychiatric: Alert and oriented x 3.  Recent memory intact, remote memory unclear.  Normal mood and affect. Well groomed.  Good eye contact.  Cardiovascular: overall no swelling, no varicosities, no edema bilaterally, normal temperatures of the legs and arms, no clubbing, cyanosis and good capillary refill.  Lymphatic: palpation is normal.  Right foot is tender at base of her fifth metatarsal and some swelling, no redness is  present.  NV intact.  All other systems reviewed and are negative   The patient has been educated about the nature of the problem(s) and counseled on treatment options.  The patient appeared to understand what I have discussed and is in agreement with it.  Encounter Diagnosis  Name Primary?  . 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  Precautions discussed.  Continue current medications.   Return to clinic to see Dr. Sharol Given   Electronically Signed Sanjuana Kava, MD 1/2/20209:09 AM

## 2018-07-26 NOTE — Patient Instructions (Signed)
You will get a call from [facility] / Dr Sharol Given regarding an appointment for a surgical consult for your foot. Their number is [phone] the address is [place]

## 2018-08-01 ENCOUNTER — Ambulatory Visit (INDEPENDENT_AMBULATORY_CARE_PROVIDER_SITE_OTHER): Payer: Medicaid Other | Admitting: Orthopedic Surgery

## 2018-08-02 ENCOUNTER — Ambulatory Visit (INDEPENDENT_AMBULATORY_CARE_PROVIDER_SITE_OTHER): Payer: Self-pay | Admitting: Orthopedic Surgery

## 2018-08-02 ENCOUNTER — Encounter (INDEPENDENT_AMBULATORY_CARE_PROVIDER_SITE_OTHER): Payer: Self-pay | Admitting: Orthopedic Surgery

## 2018-08-02 ENCOUNTER — Other Ambulatory Visit (INDEPENDENT_AMBULATORY_CARE_PROVIDER_SITE_OTHER): Payer: Self-pay

## 2018-08-02 VITALS — Ht 65.0 in | Wt 105.0 lb

## 2018-08-02 DIAGNOSIS — S92351S Displaced fracture of fifth metatarsal bone, right foot, sequela: Secondary | ICD-10-CM

## 2018-08-02 DIAGNOSIS — S92351A Displaced fracture of fifth metatarsal bone, right foot, initial encounter for closed fracture: Secondary | ICD-10-CM | POA: Insufficient documentation

## 2018-08-02 MED ORDER — HYDROCODONE-ACETAMINOPHEN 5-325 MG PO TABS: ORAL_TABLET | ORAL | 0 refills | Status: DC

## 2018-08-02 NOTE — Progress Notes (Signed)
Office Visit Note   Patient: Olivia Lester           Date of Birth: 09/10/1975           MRN: [id number] Visit Date: 08/02/2018              Requested by: Sanjuana Kava, [place], [place] [address] PCP: Soyla Dryer, PA-C  Chief Complaint  Patient presents with  . Right Foot - Pain    Nonunion fx base th MT right foot       HPI: Patient is a 43 year old woman who presents for initial evaluation for a nonunion fifth metatarsal fracture at the base.  Patient is undergone excellent conservative therapy including casting in a fracture boot for 3 months patient still complains of pain with weightbearing radiograph shows a displaced fracture.  Patient is seen in consultation for Dr. Luna Glasgow.  Patient states she is cut down on her smoking but she still smokes half a pack a day.  Assessment & Plan: Visit Diagnoses:  1. Closed displaced fracture of fifth metatarsal bone of right foot, sequela     Plan: Recommended takedown of the nonunion and internal fixation of the fracture.  Discussed the importance of smoking cessation for healing.  Risks and benefits were discussed including infection neurovascular injury pain need for additional surgery.  Patient states she understands states she would like to proceed with surgery next week.  Follow-Up Instructions: Return in about 2 weeks (around 08/16/2018).   Ortho Exam  Patient is alert, oriented, no adenopathy, well-dressed, normal affect, normal respiratory effort. Examination patient has a strong dorsalis pedis pulse she has no swelling in her foot no redness no signs of infection no dystrophic changes.  She is point tender to palpation over the nonunion site.  Radiographs of the right foot were reviewed which shows a nonunion of the metaphyseal fracture base of the fifth metatarsal right foot.  Imaging: No results found. No images are attached to the encounter.  Labs: Lab Results  Component Value Date   REPTSTATUS 06/17/2008 FINAL 06/15/2008   CULT ESCHERICHIA COLI 06/15/2008   LABORGA ESCHERICHIA COLI 06/15/2008     Lab Results  Component Value Date   ALBUMIN 4.1 12/26/2017   ALBUMIN 4.4 12/22/2017   ALBUMIN 4.7 08/30/2017    Body mass index is 17.47 kg/m.  Orders:  No orders of the defined types were placed in this encounter.  No orders of the defined types were placed in this encounter.    Procedures: No procedures performed  Clinical Data: No additional findings.  ROS:  All other systems negative, except as noted in the HPI. Review of Systems  Objective: Vital Signs: Ht 5\' 5"  (1.651 m)   Wt 105 lb (47.6 kg)   LMP 07/11/2018 (Exact Date)   BMI 17.47 kg/m   Specialty Comments:  No specialty comments available.  PMFS History: Patient Active Problem List   Diagnosis Date Noted  . Closed displaced fracture of fifth metatarsal bone of right foot 08/02/2018  . Dental abscess 12/25/2017  . Gastric ulcer 10/14/2013  . Transaminitis 10/14/2013  . Tobacco dependence 10/14/2013  . Hepatitis C 10/14/2013  . Thrombocytopenia ([place]) 10/13/2013  . Duodenitis 10/12/2013  . Alcoholism ([place]) 10/12/2013  . Abdominal pain, epigastric 10/12/2013  . ANKLE PAIN 01/01/2008   Past Medical History:  Diagnosis Date  . Back injury   . Gastric ulcer   . Gastric ulcer 10/14/2013  . Hepatitis C 10/14/2013  . Panic attack  Family History  Problem Relation Age of Onset  . Cancer Mother 80       ovarian   . Hypertension Mother   . Stroke Mother   . Early death Father   . Alcohol abuse Father     Past Surgical History:  Procedure Laterality Date  . BACK SURGERY    . BREAST SURGERY    . ESOPHAGOGASTRODUODENOSCOPY (EGD) WITH PROPOFOL N/A 10/14/2013   Procedure: ESOPHAGOGASTRODUODENOSCOPY (EGD) WITH PROPOFOL;  Surgeon: Rogene Houston, MD;  Location: [facility];  Service: Endoscopy;  Laterality: N/A;  . TONSILLECTOMY     Social History   Occupational History  . Not on  file  Tobacco Use  . Smoking status: Current Every Day Smoker    Packs/day: 1.00    Years: 20.00    Pack years: 20.00    Types: Cigarettes  . Smokeless tobacco: Never Used  Substance and Sexual Activity  . Alcohol use: Not Currently    Frequency: Never    Comment: hx heavy etoh  . Drug use: No    Types: Marijuana    Comment: hx marijuana use   . Sexual activity: Not Currently    Birth control/protection: None

## 2018-08-02 NOTE — Addendum Note (Signed)
Addended by: Meridee Score on: 08/02/2018 10:04 AM   Modules accepted: Orders

## 2018-08-06 ENCOUNTER — Ambulatory Visit (INDEPENDENT_AMBULATORY_CARE_PROVIDER_SITE_OTHER): Payer: Self-pay | Admitting: Physician Assistant

## 2018-08-07 ENCOUNTER — Encounter (HOSPITAL_COMMUNITY): Payer: Self-pay | Admitting: *Deleted

## 2018-08-07 ENCOUNTER — Other Ambulatory Visit: Payer: Self-pay

## 2018-08-07 NOTE — Progress Notes (Signed)
Pt denies SOB, chest pain, and being under the care of a cardiologist. Pt denies having a stress test, echo and cardiac cath. Pt denies having a chest x ray within the last year. Pt denies recent labs. Pt made aware to stop taking vitamins, fish oil and herbal medications. Do not take any NSAIDs ie: Ibuprofen, Advil, Naproxen (Aleve), Motrin, BC and Goody Powder. Pt verbalized understanding of all pre-op instructions.

## 2018-08-08 ENCOUNTER — Ambulatory Visit (HOSPITAL_COMMUNITY): Payer: Self-pay | Admitting: Certified Registered"

## 2018-08-08 ENCOUNTER — Encounter (HOSPITAL_COMMUNITY)
Admission: RE | Disposition: A | Discharge status: Home / Self Care | Payer: Self-pay | Source: Home / Self Care | Attending: Orthopedic Surgery

## 2018-08-08 ENCOUNTER — Ambulatory Visit (HOSPITAL_COMMUNITY)
Admission: RE | Admit: 2018-08-08 | Discharge: 2018-08-08 | Disposition: A | Discharge status: Home / Self Care | Payer: Self-pay | Attending: Orthopedic Surgery | Admitting: Orthopedic Surgery

## 2018-08-08 ENCOUNTER — Encounter (HOSPITAL_COMMUNITY): Payer: Self-pay | Admitting: *Deleted

## 2018-08-08 DIAGNOSIS — F17210 Nicotine dependence, cigarettes, uncomplicated: Secondary | ICD-10-CM | POA: Insufficient documentation

## 2018-08-08 DIAGNOSIS — S92351K Displaced fracture of fifth metatarsal bone, right foot, subsequent encounter for fracture with nonunion: Secondary | ICD-10-CM | POA: Insufficient documentation

## 2018-08-08 DIAGNOSIS — S92351S Displaced fracture of fifth metatarsal bone, right foot, sequela: Secondary | ICD-10-CM

## 2018-08-08 DIAGNOSIS — X58XXXD Exposure to other specified factors, subsequent encounter: Secondary | ICD-10-CM | POA: Insufficient documentation

## 2018-08-08 HISTORY — PX: ORIF TOE FRACTURE: SHX5032

## 2018-08-08 HISTORY — DX: Personal history of urinary calculi: Z87.442

## 2018-08-08 HISTORY — DX: Unspecified fracture of unspecified foot, subsequent encounter for fracture with nonunion: S92.909K

## 2018-08-08 HISTORY — DX: Other specified postprocedural states: R11.2

## 2018-08-08 HISTORY — DX: Other specified postprocedural states: Z98.890

## 2018-08-08 HISTORY — DX: Pneumonia, unspecified organism: J18.9

## 2018-08-08 LAB — COMPREHENSIVE METABOLIC PANEL
ALT: 21 U/L (ref 0–44)
AST: 40 U/L (ref 15–41)
Albumin: 4.2 g/dL (ref 3.5–5.0)
Alkaline Phosphatase: 38 U/L (ref 38–126)
Anion gap: 15 (ref 5–15)
BILIRUBIN TOTAL: 1.8 mg/dL — AB (ref 0.3–1.2)
BUN: 18 mg/dL (ref 6–20)
CO2: 19 mmol/L — ABNORMAL LOW (ref 22–32)
Calcium: 9.3 mg/dL (ref 8.9–10.3)
Chloride: 106 mmol/L (ref 98–111)
Creatinine, Ser: 0.68 mg/dL (ref 0.44–1.00)
GFR calc Af Amer: >60 mL/min (ref >60)
GFR calc non Af Amer: >60 mL/min (ref >60)
Glucose, Bld: 73 mg/dL (ref 70–99)
Potassium: 5.2 mmol/L — ABNORMAL HIGH (ref 3.5–5.1)
Sodium: 140 mmol/L (ref 135–145)
Total Protein: 6.9 g/dL (ref 6.5–8.1)

## 2018-08-08 LAB — CBC
HCT: 44.1 % (ref 36.0–46.0)
Hemoglobin: 14.3 g/dL (ref 12.0–15.0)
MCH: 30.8 pg (ref 26.0–34.0)
MCHC: 32.4 g/dL (ref 30.0–36.0)
MCV: 95 fL (ref 80.0–100.0)
Platelets: 246 10*3/uL (ref 150–400)
RBC: 4.64 MIL/uL (ref 3.87–5.11)
RDW: 12.6 % (ref 11.5–15.5)
WBC: 7.3 10*3/uL (ref 4.0–10.5)
nRBC: 0 % (ref 0.0–0.2)

## 2018-08-08 LAB — HCG, SERUM, QUALITATIVE: Preg, Serum: NEGATIVE

## 2018-08-08 SURGERY — OPEN REDUCTION INTERNAL FIXATION (ORIF) METATARSAL (TOE) FRACTURE
Anesthesia: Monitor Anesthesia Care | Laterality: Right

## 2018-08-08 MED ORDER — PROPOFOL 10 MG/ML IV BOLUS
INTRAVENOUS | Status: DC | PRN
  Administered 2018-08-08: 20 mg via INTRAVENOUS
  Administered 2018-08-08 (×2): 50 mg via INTRAVENOUS
  Administered 2018-08-08: 20 mg via INTRAVENOUS
  Administered 2018-08-08: 30 mg via INTRAVENOUS

## 2018-08-08 MED ORDER — FENTANYL CITRATE (PF) 250 MCG/5ML IJ SOLN
INTRAMUSCULAR | Status: AC
  Filled 2018-08-08: qty 5

## 2018-08-08 MED ORDER — MIDAZOLAM HCL 2 MG/2ML IJ SOLN
2.0000 mg | Freq: Once | INTRAMUSCULAR | Status: AC
  Administered 2018-08-08: 2 mg via INTRAVENOUS

## 2018-08-08 MED ORDER — FENTANYL CITRATE (PF) 100 MCG/2ML IJ SOLN
INTRAMUSCULAR | Status: AC
  Filled 2018-08-08: qty 2

## 2018-08-08 MED ORDER — OXYCODONE HCL 5 MG/5ML PO SOLN: 5.0000 mg | Freq: Once | ORAL | Status: AC | PRN

## 2018-08-08 MED ORDER — OXYCODONE HCL 5 MG PO TABS
ORAL_TABLET | ORAL | Status: AC
  Filled 2018-08-08: qty 1

## 2018-08-08 MED ORDER — FENTANYL CITRATE (PF) 100 MCG/2ML IJ SOLN
25.0000 ug | INTRAMUSCULAR | Status: DC | PRN
  Administered 2018-08-08 (×3): 50 ug via INTRAVENOUS

## 2018-08-08 MED ORDER — LACTATED RINGERS IV SOLN
INTRAVENOUS | Status: DC
  Administered 2018-08-08 (×2): via INTRAVENOUS

## 2018-08-08 MED ORDER — PROMETHAZINE HCL 25 MG/ML IJ SOLN: 6.2500 mg | INTRAMUSCULAR | Status: DC | PRN

## 2018-08-08 MED ORDER — OXYCODONE HCL 5 MG PO TABS
5.0000 mg | ORAL_TABLET | Freq: Once | ORAL | Status: AC | PRN
  Administered 2018-08-08: 5 mg via ORAL

## 2018-08-08 MED ORDER — FENTANYL CITRATE (PF) 100 MCG/2ML IJ SOLN
INTRAMUSCULAR | Status: DC | PRN
  Administered 2018-08-08 (×5): 50 ug via INTRAVENOUS

## 2018-08-08 MED ORDER — DEXAMETHASONE SODIUM PHOSPHATE 10 MG/ML IJ SOLN
INTRAMUSCULAR | Status: DC | PRN
  Administered 2018-08-08: 10 mg via INTRAVENOUS

## 2018-08-08 MED ORDER — OXYCODONE-ACETAMINOPHEN 5-325 MG PO TABS: 1.0000 | ORAL_TABLET | ORAL | 0 refills | Status: DC | PRN

## 2018-08-08 MED ORDER — ONDANSETRON HCL 4 MG/2ML IJ SOLN
INTRAMUSCULAR | Status: AC
  Filled 2018-08-08: qty 2

## 2018-08-08 MED ORDER — PROPOFOL 500 MG/50ML IV EMUL
INTRAVENOUS | Status: DC | PRN
  Administered 2018-08-08: 75 ug/kg/min via INTRAVENOUS

## 2018-08-08 MED ORDER — ACETAMINOPHEN 10 MG/ML IV SOLN
1000.0000 mg | Freq: Once | INTRAVENOUS | Status: DC | PRN
  Administered 2018-08-08: 1000 mg via INTRAVENOUS

## 2018-08-08 MED ORDER — ACETAMINOPHEN 10 MG/ML IV SOLN
INTRAVENOUS | Status: AC
  Filled 2018-08-08: qty 100

## 2018-08-08 MED ORDER — FENTANYL CITRATE (PF) 100 MCG/2ML IJ SOLN
100.0000 ug | Freq: Once | INTRAMUSCULAR | Status: AC
  Administered 2018-08-08: 100 ug via INTRAVENOUS

## 2018-08-08 MED ORDER — BUPIVACAINE HCL (PF) 0.5 % IJ SOLN
INTRAMUSCULAR | Status: DC | PRN
  Administered 2018-08-08: 20 mL

## 2018-08-08 MED ORDER — MIDAZOLAM HCL 2 MG/2ML IJ SOLN
INTRAMUSCULAR | Status: AC
  Filled 2018-08-08: qty 2

## 2018-08-08 MED ORDER — CEFAZOLIN SODIUM-DEXTROSE 2-4 GM/100ML-% IV SOLN
2.0000 g | INTRAVENOUS | Status: AC
  Administered 2018-08-08: 2 g via INTRAVENOUS
  Filled 2018-08-08: qty 100

## 2018-08-08 MED ORDER — ONDANSETRON HCL 4 MG/2ML IJ SOLN
INTRAMUSCULAR | Status: DC | PRN
  Administered 2018-08-08: 4 mg via INTRAVENOUS

## 2018-08-08 MED ORDER — CHLORHEXIDINE GLUCONATE 4 % EX LIQD: 60.0000 mL | Freq: Once | CUTANEOUS | Status: DC

## 2018-08-08 MED ORDER — 0.9 % SODIUM CHLORIDE (POUR BTL) OPTIME
TOPICAL | Status: DC | PRN
  Administered 2018-08-08: 1000 mL

## 2018-08-08 MED ORDER — DEXAMETHASONE SODIUM PHOSPHATE 10 MG/ML IJ SOLN
INTRAMUSCULAR | Status: AC
  Filled 2018-08-08: qty 1

## 2018-08-08 SURGICAL SUPPLY — 58 items
BLADE AVERAGE 25MMX9MM (BLADE)
BLADE AVERAGE 25X9 (BLADE) IMPLANT
BLADE MINI RND TIP GREEN BEAV (BLADE) IMPLANT
BNDG CMPR 9X4 STRL LF SNTH (GAUZE/BANDAGES/DRESSINGS) ×1
BNDG COHESIVE 1X5 TAN STRL LF (GAUZE/BANDAGES/DRESSINGS) IMPLANT
BNDG COHESIVE 6X5 TAN STRL LF (GAUZE/BANDAGES/DRESSINGS) ×2 IMPLANT
BNDG ESMARK 4X9 LF (GAUZE/BANDAGES/DRESSINGS) ×3 IMPLANT
BNDG GAUZE ELAST 4 BULKY (GAUZE/BANDAGES/DRESSINGS) ×2 IMPLANT
BNDG GAUZE STRTCH 6 (GAUZE/BANDAGES/DRESSINGS) IMPLANT
CORDS BIPOLAR (ELECTRODE) ×3 IMPLANT
COTTON STERILE ROLL (GAUZE/BANDAGES/DRESSINGS) IMPLANT
COVER SURGICAL LIGHT HANDLE (MISCELLANEOUS) ×6 IMPLANT
COVER WAND RF STERILE (DRAPES) ×3 IMPLANT
CUFF TOURNIQUET SINGLE 18IN (TOURNIQUET CUFF) IMPLANT
CUFF TOURNIQUET SINGLE 24IN (TOURNIQUET CUFF) IMPLANT
CUFF TOURNIQUET SINGLE 34IN LL (TOURNIQUET CUFF) IMPLANT
CUFF TOURNIQUET SINGLE 44IN (TOURNIQUET CUFF) IMPLANT
DRAPE OEC MINIVIEW 54X84 (DRAPES) IMPLANT
DRAPE U-SHAPE 47X51 STRL (DRAPES) ×3 IMPLANT
DRSG ADAPTIC 3X8 NADH LF (GAUZE/BANDAGES/DRESSINGS) IMPLANT
DRSG EMULSION OIL 3X3 NADH (GAUZE/BANDAGES/DRESSINGS) ×2 IMPLANT
DURAPREP 26ML APPLICATOR (WOUND CARE) ×3 IMPLANT
ELECT REM PT RETURN 9FT ADLT (ELECTROSURGICAL) ×3
ELECTRODE REM PT RTRN 9FT ADLT (ELECTROSURGICAL) ×1 IMPLANT
GAUZE SPONGE 2X2 8PLY STRL LF (GAUZE/BANDAGES/DRESSINGS) IMPLANT
GAUZE SPONGE 4X4 12PLY STRL (GAUZE/BANDAGES/DRESSINGS) IMPLANT
GAUZE SPONGE 4X4 12PLY STRL LF (GAUZE/BANDAGES/DRESSINGS) ×2 IMPLANT
GLOVE BIOGEL PI IND STRL 9 (GLOVE) ×1 IMPLANT
GLOVE BIOGEL PI INDICATOR 9 (GLOVE) ×2
GLOVE SURG ORTHO 9.0 STRL STRW (GLOVE) ×3 IMPLANT
GOWN STRL REUS W/ TWL XL LVL3 (GOWN DISPOSABLE) ×2 IMPLANT
GOWN STRL REUS W/TWL XL LVL3 (GOWN DISPOSABLE) ×6
GUIDEWIRE NON THREAD 1.6MM (WIRE) ×2 IMPLANT
KIT BASIN OR (CUSTOM PROCEDURE TRAY) ×3 IMPLANT
KIT TURNOVER KIT B (KITS) ×3 IMPLANT
MANIFOLD NEPTUNE II (INSTRUMENTS) ×3 IMPLANT
NDL HYPO 25GX1X1/2 BEV (NEEDLE) IMPLANT
NEEDLE HYPO 25GX1X1/2 BEV (NEEDLE) IMPLANT
NS IRRIG 1000ML POUR BTL (IV SOLUTION) ×3 IMPLANT
PACK ORTHO EXTREMITY (CUSTOM PROCEDURE TRAY) ×3 IMPLANT
PAD ARMBOARD 7.5X6 YLW CONV (MISCELLANEOUS) ×6 IMPLANT
PAD CAST 4YDX4 CTTN HI CHSV (CAST SUPPLIES) IMPLANT
PADDING CAST COTTON 4X4 STRL (CAST SUPPLIES)
PUTTY BONE DBX 2.5 MIS (Bone Implant) ×2 IMPLANT
SCREW HEADLESS COMP 4.5X42 (Screw) ×2 IMPLANT
SPECIMEN JAR SMALL (MISCELLANEOUS) ×3 IMPLANT
SPONGE GAUZE 2X2 STER 10/PKG (GAUZE/BANDAGES/DRESSINGS)
SUCTION FRAZIER HANDLE 10FR (MISCELLANEOUS)
SUCTION TUBE FRAZIER 10FR DISP (MISCELLANEOUS) IMPLANT
SUT ETHILON 2 0 PSLX (SUTURE) ×6 IMPLANT
SUT VIC AB 2-0 CT1 27 (SUTURE) ×3
SUT VIC AB 2-0 CT1 TAPERPNT 27 (SUTURE) ×1 IMPLANT
SYR CONTROL 10ML LL (SYRINGE) IMPLANT
TOWEL OR 17X24 6PK STRL BLUE (TOWEL DISPOSABLE) ×3 IMPLANT
TOWEL OR 17X26 10 PK STRL BLUE (TOWEL DISPOSABLE) ×3 IMPLANT
TUBE CONNECTING 12'X1/4 (SUCTIONS)
TUBE CONNECTING 12X1/4 (SUCTIONS) IMPLANT
WATER STERILE IRR 1000ML POUR (IV SOLUTION) ×3 IMPLANT

## 2018-08-08 NOTE — Anesthesia Postprocedure Evaluation (Signed)
Anesthesia Post Note  Patient: Olivia Lester  Procedure(s) Performed: RIGHT FOOT INTERNAL FIXATION 5TH METATARSAL (Right )     Patient location during evaluation: PACU Anesthesia Type: Regional Level of consciousness: awake and alert Pain management: pain level controlled Vital Signs Assessment: post-procedure vital signs reviewed and stable Respiratory status: spontaneous breathing, nonlabored ventilation and respiratory function stable Cardiovascular status: blood pressure returned to baseline and stable Postop Assessment: no apparent nausea or vomiting Anesthetic complications: no    Last Vitals:  Vitals:   08/08/18 0913  BP: (!) 171/104  Pulse: 70  Resp: 20  Temp: 36.5 C  SpO2: 100%    Last Pain:  Vitals:   08/08/18 1110  TempSrc:   PainSc: [place]

## 2018-08-08 NOTE — Anesthesia Procedure Notes (Signed)
Anesthesia Regional Block: Ankle block   Pre-Anesthetic Checklist: ,, timeout performed, Correct Patient, Correct Site, Correct Laterality, Correct Procedure, Correct Position, site marked, Risks and benefits discussed, pre-op evaluation,  At surgeon's request and post-op pain management  Laterality: Right  Prep: Maximum Sterile Barrier Precautions used, chloraprep       Needles:  Injection technique: Single-shot  Needle Type: Echogenic Needle     Needle Length: 4cm  Needle Gauge: 25     Additional Needles:   (landmark technique)  Narrative:  Start time: 08/08/2018 11:37 AM End time: 08/08/2018 11:40 AM Injection made incrementally with aspirations every 5 mL.  Performed by: Personally  Anesthesiologist: Brennan Bailey, MD  Additional Notes: Risks, benefits, and alternative discussed. Patient gave consent for procedure. Patient prepped and draped in sterile fashion. Sedation administered, patient remains easily responsive to voice. Local anesthetic given in 5cc increments with no signs or symptoms of intravascular injection. No pain or paraesthesias with injection. Patient monitored throughout procedure with signs of LAST or immediate complications. Tolerated well.   Tawny Asal, MD

## 2018-08-08 NOTE — H&P (Signed)
Olivia Lester is an 43 y.o. female.   Chief Complaint: painfull non union metatarsal fracture HPI: Olivia Lester is a 43 year old woman who presents for initial evaluation for a nonunion fifth metatarsal fracture at the base.  Olivia Lester is undergone excellent conservative therapy including casting in a fracture boot for 3 months Olivia Lester still complains of pain with weightbearing radiograph shows a displaced fracture.  Olivia Lester is seen in consultation for Dr. Luna Glasgow.  Olivia Lester states Olivia Lester is cut down on her smoking but Olivia Lester still smokes half a pack a day.   Past Medical History:  Diagnosis Date  . Back injury   . Gastric ulcer   . Gastric ulcer 10/14/2013  . Hepatitis C 10/14/2013  . History of kidney stones   . 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 Panic attack   . Pneumonia   . PONV (postoperative nausea and vomiting)     Past Surgical History:  Procedure Laterality Date  . BACK SURGERY    . BREAST SURGERY    . ESOPHAGOGASTRODUODENOSCOPY (EGD) WITH PROPOFOL N/A 10/14/2013   Procedure: ESOPHAGOGASTRODUODENOSCOPY (EGD) WITH PROPOFOL;  Surgeon: Rogene Houston, MD;  Location: [facility];  Service: Endoscopy;  Laterality: N/A;  . TONSILLECTOMY      Family History  Problem Relation Age of Onset  . Cancer Mother 51       ovarian   . Hypertension Mother   . Stroke Mother   . Early death Father   . Alcohol abuse Father    Social History:  reports that Olivia Lester has been smoking cigarettes. Olivia Lester has a 10.00 pack-year smoking history. Olivia Lester has never used smokeless tobacco. Olivia Lester reports current alcohol use. Olivia Lester reports current drug use. Drug: Marijuana.  Allergies: No Known Allergies  No medications prior to admission.    No results found for this or any previous visit (from the past 48 hour(s)). No results found.  Review of Systems  All other systems reviewed and are negative.   Last menstrual period 08/01/2018. Physical Exam  Olivia Lester is alert, oriented, no adenopathy,  well-dressed, normal affect, normal respiratory effort. Examination Olivia Lester has a strong dorsalis pedis pulse Olivia Lester has no swelling in her foot no redness no signs of infection no dystrophic changes.  Olivia Lester is point tender to palpation over the nonunion site.  Radiographs of the right foot were reviewed which shows a nonunion of the metaphyseal fracture base of the fifth metatarsal right foot.  Assessment/Plan 1. Closed displaced fracture of fifth metatarsal bone of right foot, sequela     Plan: Recommended takedown of the nonunion and internal fixation of the fracture.  Discussed the importance of smoking cessation for healing.  Risks and benefits were discussed including infection neurovascular injury pain need for additional surgery.  Olivia Lester states Olivia Lester understands states Olivia Lester would like to proceed with surgery next week.    Newt Minion, MD 08/08/2018, 6:50 AM

## 2018-08-08 NOTE — Discharge Instructions (Signed)

## 2018-08-08 NOTE — Op Note (Signed)
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 Application of 2.5 cc of demineralized bone matrix with putty.  SURGEON:  Newt Minion, MD  PHYSICIAN ASSISTANT:None ANESTHESIA:   General  PREOPERATIVE INDICATIONS:  Olivia Lester is a  43 y.o. female with a diagnosis of Non-Union Right 5th Metatarsal Fracture who failed conservative measures and elected for surgical management.    The risks benefits and alternatives were discussed with the patient preoperatively including but not limited to the risks of infection, bleeding, nerve injury, cardiopulmonary complications, the need for revision surgery, among others, and the patient was willing to proceed.  OPERATIVE IMPLANTS: 2.5 cc demineralized bone matrix with putty, 4.5 headless cannulated screw 42 mm in length.  @ENCIMAGES @  OPERATIVE FINDINGS: Soft bone with fibrous nonunion.  OPERATIVE PROCEDURE: Patient was brought the operating room and underwent an ankle block.  After adequate levels anesthesia were obtained patient's right lower extremity was prepped using DuraPrep draped into a sterile field a timeout was called patient received antibiotics preoperatively.  A lateral incision was made over the base of the fifth metatarsal this is carried directly down to bone.  Patient had a soft fibrous nonunion at the fracture site with a rondure and a curette the fibrous tissue was removed back to bleeding viable cancellus bone.  A guidewire was inserted from the base of the fifth metatarsal down the shaft.  C-arm fluoroscopy verified alignment.  A 4.5 mm headless cannulated screw was then inserted over the wire 42 mm in length.  The nonunion site was packed with bone graft and the screw was used to compress the fracture site with bone graft.   C-arm fluoroscopy verified alignment.  The guidewire was removed the wound was irrigated with normal saline the incision was closed using 2-0 nylon a sterile dressing was applied patient was taken to PACU in stable condition.   DISCHARGE PLANNING:  Antibiotic duration: Preoperative antibiotics  Weightbearing: Strict nonweightbearing on the right, again reinforced the importance of smoking cessation with risk of the bone nonhealing skin nonhealing and potential for ray amputation.  Pain medication: Prescription for Percocet  Dressing care/ Wound VAC: Keep dressing clean and dry until follow-up  Ambulatory devices: Crutches  Discharge to: Home.  Follow-up: In the office 1 week post operative.

## 2018-08-08 NOTE — Transfer of Care (Signed)
Immediate Anesthesia Transfer of Care Note  Patient: Olivia Lester  Procedure(s) Performed: RIGHT FOOT INTERNAL FIXATION 5TH METATARSAL (Right )  Patient Location: PACU  Anesthesia Type:MAC and Regional  Level of Consciousness: awake and alert   Airway & Oxygen Therapy: Patient Spontanous Breathing and Patient connected to nasal cannula oxygen  Post-op Assessment: Report given to RN and Post -op Vital signs reviewed and stable  Post vital signs: Reviewed and stable  Last Vitals:  Vitals Value Taken Time  BP 129/96 08/08/2018  2:18 PM  Temp    Pulse 78 08/08/2018  2:20 PM  Resp 14 08/08/2018  2:20 PM  SpO2 98 % 08/08/2018  2:20 PM  Vitals shown include unvalidated device data.  Last Pain:  Vitals:   08/08/18 1110  TempSrc:   PainSc: 7       Patients Stated Pain Goal: 1 (66/29/47 6546)  Complications: No apparent anesthesia complications

## 2018-08-08 NOTE — Progress Notes (Signed)
Orthopedic Tech Progress Note Patient Details:  Ronneisha Jett 1975-08-01 [id number]  Ortho Devices Type of Ortho Device: CAM walker, Crutches Ortho Device/Splint Location: RLE Ortho Device/Splint Interventions: Ordered, Application, Adjustment   Post Interventions Patient Tolerated: Well Instructions Provided: Care of device, Adjustment of device   Whit Bruni J Jamika Sadek 08/08/2018, 3:09 PM

## 2018-08-08 NOTE — Anesthesia Preprocedure Evaluation (Addendum)
Anesthesia Evaluation  Patient identified by MRN, date of birth, ID band Patient awake    Reviewed: Allergy & Precautions, NPO status , Patient's Chart, lab work & pertinent test results  History of Anesthesia Complications (+) PONV  Airway Mallampati: II  TM Distance: >3 FB Neck ROM: Full    Dental  (+) Teeth Intact, Poor Dentition   Pulmonary Current Smoker,    Pulmonary exam normal breath sounds clear to auscultation       Cardiovascular negative cardio ROS Normal cardiovascular exam Rhythm:Regular Rate:Normal     Neuro/Psych Anxiety negative neurological ROS     GI/Hepatic PUD, (+) Hepatitis -, C  Endo/Other  negative endocrine ROS  Renal/GU negative Renal ROS     Musculoskeletal negative musculoskeletal ROS (+)   Abdominal   Peds  Hematology negative hematology ROS (+)   Anesthesia Other Findings Day of surgery medications reviewed with the patient.  Reproductive/Obstetrics                            Anesthesia Physical Anesthesia Plan  ASA: II  Anesthesia Plan: MAC and Regional   Post-op Pain Management:    Induction:   PONV Risk Score and Plan: Treatment may vary due to age or medical condition, Propofol infusion and Ondansetron  Airway Management Planned: Natural Airway and Simple Face Mask  Additional Equipment:   Intra-op Plan:   Post-operative Plan:   Informed Consent: I have reviewed the patients History and Physical, chart, labs and discussed the procedure including the risks, benefits and alternatives for the proposed anesthesia with the patient or authorized representative who has indicated his/her understanding and acceptance.     Dental advisory given  Plan Discussed with: CRNA  Anesthesia Plan Comments: (Ankle block)       Anesthesia Quick Evaluation

## 2018-08-08 NOTE — Progress Notes (Signed)
Patient discharged from facility, 50 mcgs of fentanyl needed to be wasted in pyxis, unable to waster in pyxis d/t discharge status, Leata Mouse, RN wasted in pyxis rm in stericycle.  Rowe Pavy, RN

## 2018-08-09 ENCOUNTER — Telehealth (INDEPENDENT_AMBULATORY_CARE_PROVIDER_SITE_OTHER): Payer: Self-pay

## 2018-08-09 ENCOUNTER — Encounter (HOSPITAL_COMMUNITY): Payer: Self-pay | Admitting: Orthopedic Surgery

## 2018-08-09 NOTE — Telephone Encounter (Signed)
Patient left voicemail in tears complaining of a lot of pain. She had surgery yesterday. Wanting to know what to do and if she can take 2 oxycodones at a time

## 2018-08-09 NOTE — Telephone Encounter (Signed)
See message below. Pt had surgery yesterday internal fx 5th MT. I will advise to elevate, ice and loosen coban if needed but address what she should do with her medication. Wants to take 2 percocet. Advise if ok and frequency.

## 2018-08-09 NOTE — Telephone Encounter (Signed)
I called pt and advised that she may increase her Percocet per Dr. Sharol Given for significant pain that she can remove the coban dressing and make sure that she elevates her foot higher than her heart and using ice 15 minuets at a time 2-3 times a day.

## 2018-08-09 NOTE — Telephone Encounter (Signed)
Ok to take 2 percocet, may loosen the coban wrap

## 2018-08-10 ENCOUNTER — Encounter (INDEPENDENT_AMBULATORY_CARE_PROVIDER_SITE_OTHER): Payer: Self-pay | Admitting: Family Medicine

## 2018-08-10 ENCOUNTER — Encounter (INDEPENDENT_AMBULATORY_CARE_PROVIDER_SITE_OTHER): Payer: Self-pay

## 2018-08-10 ENCOUNTER — Ambulatory Visit (INDEPENDENT_AMBULATORY_CARE_PROVIDER_SITE_OTHER): Payer: Self-pay | Admitting: Family Medicine

## 2018-08-10 ENCOUNTER — Ambulatory Visit (INDEPENDENT_AMBULATORY_CARE_PROVIDER_SITE_OTHER): Payer: Self-pay

## 2018-08-10 VITALS — Ht 65.0 in | Wt 105.0 lb

## 2018-08-10 DIAGNOSIS — S92351S Displaced fracture of fifth metatarsal bone, right foot, sequela: Secondary | ICD-10-CM

## 2018-08-10 NOTE — Progress Notes (Signed)
Patient was worked in today for dressing change. S/p ORIF right 5th MT.  Incision well approximated and stiches intact. Minimal swelling and no redness. Patient had removed surgical dressing concerned about bloody drainage.  Dry dressing reapplied. Advised patient to change dressing every day. To elevate foot higher than heart and remain non weight bearing with fracture boot and crutches. Patient was advised that per Dr. Sharol Given she would not be able to have her pain medication refilled until next week. She ill follow up on Thursday and call with any questions.    Osama Coleson, [place], [employer]

## 2018-08-11 ENCOUNTER — Encounter (INDEPENDENT_AMBULATORY_CARE_PROVIDER_SITE_OTHER): Payer: Self-pay | Admitting: Family Medicine

## 2018-08-11 NOTE — Progress Notes (Signed)
Seen by Dr. Sharol Given.

## 2018-08-15 ENCOUNTER — Telehealth (INDEPENDENT_AMBULATORY_CARE_PROVIDER_SITE_OTHER): Payer: Self-pay | Admitting: Orthopedic Surgery

## 2018-08-15 NOTE — Telephone Encounter (Signed)
Patient called advised she is out of her medicine (Percocet) Patient said she had surgery last week. Patient asked if it can be called in to the pharmacy? The number to contact patient is [phone]

## 2018-08-15 NOTE — Telephone Encounter (Signed)
Tried to call pt no answer and mail box is full unable to leave a message. Pt has office visit tomorrow will have to wait to refill until that time. Will hold message and try again later today.

## 2018-08-16 ENCOUNTER — Ambulatory Visit (INDEPENDENT_AMBULATORY_CARE_PROVIDER_SITE_OTHER): Payer: Self-pay | Admitting: Orthopedic Surgery

## 2018-08-16 ENCOUNTER — Encounter (INDEPENDENT_AMBULATORY_CARE_PROVIDER_SITE_OTHER): Payer: Self-pay | Admitting: Orthopedic Surgery

## 2018-08-16 VITALS — Ht 65.0 in | Wt 105.0 lb

## 2018-08-16 DIAGNOSIS — S92351S Displaced fracture of fifth metatarsal bone, right foot, sequela: Secondary | ICD-10-CM

## 2018-08-16 MED ORDER — OXYCODONE-ACETAMINOPHEN 5-325 MG PO TABS: 1.0000 | ORAL_TABLET | Freq: Three times a day (TID) | ORAL | 0 refills | Status: DC | PRN

## 2018-08-16 NOTE — Telephone Encounter (Signed)
Pt is in office today will address during appt.

## 2018-08-23 ENCOUNTER — Ambulatory Visit (INDEPENDENT_AMBULATORY_CARE_PROVIDER_SITE_OTHER): Payer: Medicaid Other | Admitting: Physician Assistant

## 2018-08-23 ENCOUNTER — Ambulatory Visit (INDEPENDENT_AMBULATORY_CARE_PROVIDER_SITE_OTHER): Payer: Self-pay

## 2018-08-23 ENCOUNTER — Encounter (INDEPENDENT_AMBULATORY_CARE_PROVIDER_SITE_OTHER): Payer: Self-pay | Admitting: Physician Assistant

## 2018-08-23 VITALS — Ht 65.0 in | Wt 105.0 lb

## 2018-08-23 DIAGNOSIS — S92351S Displaced fracture of fifth metatarsal bone, right foot, sequela: Secondary | ICD-10-CM

## 2018-08-23 DIAGNOSIS — M79671 Pain in right foot: Secondary | ICD-10-CM

## 2018-08-23 MED ORDER — OXYCODONE-ACETAMINOPHEN 5-325 MG PO TABS: 1.0000 | ORAL_TABLET | Freq: Three times a day (TID) | ORAL | 0 refills | Status: DC | PRN

## 2018-08-23 NOTE — Progress Notes (Signed)
Office Visit Note   Patient: Olivia Lester           Date of Birth: [birth year]           MRN: [id number] Visit Date: 08/23/2018              Requested by: Soyla Dryer, PA-C [address] [place], [place] [address] PCP: Soyla Dryer, PA-C  Chief Complaint  Patient presents with  . Right Foot - Routine Post Op    08/08/2018 right foot ORIF 5th MT      HPI: The patient is a 43 year old woman who is seen for postoperative follow-up following open reduction internal fixation of her fifth metatarsal fracture on 08/08/2018.  She has been wearing a fracture boot and maintaining nonweightbearing with crutches.  She has had moderate to severe pain and requests refill on her pain medication.  Assessment & Plan: Visit Diagnoses:  1. Closed displaced fracture of fifth metatarsal bone of right foot, sequela   2. Pain in right foot     Plan: Sutures were harvested this visit.  The patient will continue on a fracture boot nonweightbearing with the crutches.  Percocet was refilled this visit.  She will continue to be nonweightbearing for at least 3 more weeks and follow-up in 3 weeks with radiographs at that time.  Follow-Up Instructions: Return in about 3 weeks (around 09/13/2018).   Ortho Exam  Patient is alert, oriented, no adenopathy, well-dressed, normal affect, normal respiratory effort. Incision to right foot is healing well and sutures removed without difficulty.  No signs of cellulitis or infection.  Mild edema.  She has good range of motion at the ankle was encouraged to do range of motion.  She has good pedal pulses.  Imaging: No results found. No images are attached to the encounter.  Labs: Lab Results  Component Value Date   REPTSTATUS 06/17/2008 FINAL 06/15/2008   CULT ESCHERICHIA COLI 06/15/2008   LABORGA ESCHERICHIA COLI 06/15/2008     Lab Results  Component Value Date   ALBUMIN 4.2 08/08/2018   ALBUMIN 4.1 12/26/2017   ALBUMIN 4.4 12/22/2017    Body  mass index is 17.47 kg/m.  Orders:  Orders Placed This Encounter  Procedures  . XR Foot Complete Right   Meds ordered this encounter  Medications  . oxyCODONE-acetaminophen (PERCOCET/ROXICET) 5-325 MG tablet    Sig: Take 1 tablet by mouth every 8 (eight) hours as needed.    Dispense:  20 tablet    Refill:  0     Procedures: No procedures performed  Clinical Data: No additional findings.  ROS:  All other systems negative, except as noted in the HPI. Review of Systems  Objective: Vital Signs: Ht 5\' 5"  (1.651 m)   Wt 105 lb (47.6 kg)   LMP 08/01/2018 (Exact Date)   BMI 17.47 kg/m   Specialty Comments:  No specialty comments available.  PMFS History: Patient Active Problem List   Diagnosis Date Noted  . Closed displaced fracture of fifth metatarsal bone of right foot 08/02/2018  . Dental abscess 12/25/2017  . Gastric ulcer 10/14/2013  . Transaminitis 10/14/2013  . Tobacco dependence 10/14/2013  . Hepatitis C 10/14/2013  . Thrombocytopenia ([place]) 10/13/2013  . Duodenitis 10/12/2013  . Alcoholism ([employer]) 10/12/2013  . Abdominal pain, epigastric 10/12/2013  . ANKLE PAIN 01/01/2008   Past Medical History:  Diagnosis Date  . Back injury   . Gastric ulcer   . Gastric ulcer 10/14/2013  . Hepatitis C 10/14/2013  .  History of kidney stones   . 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 Panic attack   . Pneumonia   . PONV (postoperative nausea and vomiting)     Family History  Problem Relation Age of Onset  . Cancer Mother 29       ovarian   . Hypertension Mother   . Stroke Mother   . Early death Father   . Alcohol abuse Father     Past Surgical History:  Procedure Laterality Date  . BACK SURGERY    . BREAST SURGERY    . ESOPHAGOGASTRODUODENOSCOPY (EGD) WITH PROPOFOL N/A 10/14/2013   Procedure: ESOPHAGOGASTRODUODENOSCOPY (EGD) WITH PROPOFOL;  Surgeon: Rogene Houston, MD;  Location: [facility];  Service: Endoscopy;  Laterality: N/A;  . ORIF TOE  FRACTURE Right 08/08/2018   Procedure: RIGHT FOOT INTERNAL FIXATION 5TH METATARSAL;  Surgeon: Newt Minion, MD;  Location: [facility];  Service: Orthopedics;  Laterality: Right;  . TONSILLECTOMY     Social History   Occupational History  . Not on file  Tobacco Use  . Smoking status: Current Every Day Smoker    Packs/day: 0.50    Years: 20.00    Pack years: 10.00    Types: Cigarettes  . Smokeless tobacco: Never Used  Substance and Sexual Activity  . Alcohol use: Yes    Frequency: Never    Comment: social   . Drug use: Yes    Types: Marijuana    Comment: last use July 31, 2018  . Sexual activity: Not Currently    Birth control/protection: None

## 2018-08-24 ENCOUNTER — Encounter (INDEPENDENT_AMBULATORY_CARE_PROVIDER_SITE_OTHER): Payer: Self-pay | Admitting: Physician Assistant

## 2018-08-27 ENCOUNTER — Encounter (INDEPENDENT_AMBULATORY_CARE_PROVIDER_SITE_OTHER): Payer: Self-pay | Admitting: Orthopedic Surgery

## 2018-08-27 NOTE — Progress Notes (Signed)
Office Visit Note   Patient: Olivia Lester           Date of Birth: 02/25/1976           MRN: [id number] Visit Date: 08/16/2018              Requested by: Soyla Dryer, PA-C [address] [place], [place] [address] PCP: Soyla Dryer, PA-C  Chief Complaint  Patient presents with  . Right Foot - Routine Post Op    08/08/2018 right foor ORIF 5th MT      HPI: Patient is a 43 year old woman who presents follow-up status post open reduction internal fixation for a nonunion base of the fifth metatarsal fracture.  Patient is nonweightbearing with a fracture boot and crutches she complains of increased pain and swelling.  Assessment & Plan: Visit Diagnoses:  1. Closed displaced fracture of fifth metatarsal bone of right foot, sequela     Plan: Recommended continue elevation start Dial soap cleansing dry dressing changes daily strict nonweightbearing reevaluate in 1 week.  Prescription written for Percocet discussed the review of the narcotic profile and the importance of minimizing narcotics.  Follow-Up Instructions: Return in about 1 week (around 08/23/2018).   Ortho Exam  Patient is alert, oriented, no adenopathy, well-dressed, normal affect, normal respiratory effort. Examination the incision is well approximated there is no cellulitis no drainage no odor no signs of infection.  Imaging: No results found.   Labs: Lab Results  Component Value Date   REPTSTATUS 06/17/2008 FINAL 06/15/2008   CULT ESCHERICHIA COLI 06/15/2008   LABORGA ESCHERICHIA COLI 06/15/2008     Lab Results  Component Value Date   ALBUMIN 4.2 08/08/2018   ALBUMIN 4.1 12/26/2017   ALBUMIN 4.4 12/22/2017    Body mass index is 17.47 kg/m.  Orders:  No orders of the defined types were placed in this encounter.  Meds ordered this encounter  Medications  . DISCONTD: oxyCODONE-acetaminophen (PERCOCET/ROXICET) 5-325 MG tablet    Sig: Take 1 tablet by mouth every 8 (eight) hours as needed.     Dispense:  20 tablet    Refill:  0     Procedures: No procedures performed  Clinical Data: No additional findings.  ROS:  All other systems negative, except as noted in the HPI. Review of Systems  Objective: Vital Signs: Ht 5\' 5"  (1.651 m)   Wt 105 lb (47.6 kg)   LMP 08/01/2018 (Exact Date)   BMI 17.47 kg/m   Specialty Comments:  No specialty comments available.  PMFS History: Patient Active Problem List   Diagnosis Date Noted  . Closed displaced fracture of fifth metatarsal bone of right foot 08/02/2018  . Dental abscess 12/25/2017  . Gastric ulcer 10/14/2013  . Transaminitis 10/14/2013  . Tobacco dependence 10/14/2013  . Hepatitis C 10/14/2013  . Thrombocytopenia ([facility]) 10/13/2013  . Duodenitis 10/12/2013  . Alcoholism ([place]) 10/12/2013  . Abdominal pain, epigastric 10/12/2013  . ANKLE PAIN 01/01/2008   Past Medical History:  Diagnosis Date  . Back injury   . Gastric ulcer   . Gastric ulcer 10/14/2013  . Hepatitis C 10/14/2013  . History of kidney stones   . 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 Panic attack   . Pneumonia   . PONV (postoperative nausea and vomiting)     Family History  Problem Relation Age of Onset  . Cancer Mother 21       ovarian   . Hypertension Mother   .  Stroke Mother   . Early death Father   . Alcohol abuse Father     Past Surgical History:  Procedure Laterality Date  . BACK SURGERY    . BREAST SURGERY    . ESOPHAGOGASTRODUODENOSCOPY (EGD) WITH PROPOFOL N/A 10/14/2013   Procedure: ESOPHAGOGASTRODUODENOSCOPY (EGD) WITH PROPOFOL;  Surgeon: Rogene Houston, MD;  Location: [facility];  Service: Endoscopy;  Laterality: N/A;  . ORIF TOE FRACTURE Right 08/08/2018   Procedure: RIGHT FOOT INTERNAL FIXATION 5TH METATARSAL;  Surgeon: Newt Minion, MD;  Location: [facility];  Service: Orthopedics;  Laterality: Right;  . TONSILLECTOMY     Social History   Occupational History  . Not on file  Tobacco Use  . Smoking status:  Current Every Day Smoker    Packs/day: 0.50    Years: 20.00    Pack years: 10.00    Types: Cigarettes  . Smokeless tobacco: Never Used  Substance and Sexual Activity  . Alcohol use: Yes    Frequency: Never    Comment: social   . Drug use: Yes    Types: Marijuana    Comment: last use July 31, 2018  . Sexual activity: Not Currently    Birth control/protection: None

## 2018-08-30 ENCOUNTER — Telehealth (INDEPENDENT_AMBULATORY_CARE_PROVIDER_SITE_OTHER): Payer: Self-pay | Admitting: Orthopedic Surgery

## 2018-08-30 NOTE — Telephone Encounter (Signed)
Pt lvm said she needs a refill on her pain medication and have that sent to the pharmacy in [place].  She also said she doesn't have a phone number where she can be contacted at right now.

## 2018-08-31 ENCOUNTER — Other Ambulatory Visit (INDEPENDENT_AMBULATORY_CARE_PROVIDER_SITE_OTHER): Payer: Self-pay | Admitting: Physician Assistant

## 2018-08-31 ENCOUNTER — Telehealth (INDEPENDENT_AMBULATORY_CARE_PROVIDER_SITE_OTHER): Payer: Self-pay

## 2018-08-31 MED ORDER — OXYCODONE-ACETAMINOPHEN 5-325 MG PO TABS: 1.0000 | ORAL_TABLET | Freq: Three times a day (TID) | ORAL | 0 refills | Status: DC | PRN

## 2018-08-31 NOTE — Telephone Encounter (Signed)
Please advise, pt last Rx written 08/23/2018 Qty # 20 for 6 days Oxy-Acet 5-325; checked database. Thank you

## 2018-08-31 NOTE — Telephone Encounter (Signed)
I tried to call patient and make her aware that her Rx was filled and sent to pharmacy, could not lvm.

## 2018-09-04 ENCOUNTER — Ambulatory Visit (INDEPENDENT_AMBULATORY_CARE_PROVIDER_SITE_OTHER): Payer: Medicaid Other | Admitting: Physician Assistant

## 2018-09-06 ENCOUNTER — Encounter (INDEPENDENT_AMBULATORY_CARE_PROVIDER_SITE_OTHER): Payer: Self-pay | Admitting: Physician Assistant

## 2018-09-06 ENCOUNTER — Ambulatory Visit (INDEPENDENT_AMBULATORY_CARE_PROVIDER_SITE_OTHER): Payer: Self-pay

## 2018-09-06 ENCOUNTER — Ambulatory Visit (INDEPENDENT_AMBULATORY_CARE_PROVIDER_SITE_OTHER): Payer: Self-pay | Admitting: Physician Assistant

## 2018-09-06 DIAGNOSIS — S92351S Displaced fracture of fifth metatarsal bone, right foot, sequela: Secondary | ICD-10-CM

## 2018-09-06 MED ORDER — OXYCODONE-ACETAMINOPHEN 5-325 MG PO TABS: 1.0000 | ORAL_TABLET | Freq: Three times a day (TID) | ORAL | 0 refills | Status: DC | PRN

## 2018-09-06 NOTE — Progress Notes (Signed)
Office Visit Note   Patient: Olivia Lester           Date of Birth: 1975/10/26           MRN: [id number] Visit Date: 09/06/2018              Requested by: Soyla Dryer, PA-C [address] [place], [place] [address] PCP: Soyla Dryer, PA-C  Chief Complaint  Patient presents with  . Right Foot - Follow-up      HPI: The patient is a 43 year old woman who is seen for postoperative follow-up following open reduction internal fixation of her fifth metatarsal fracture on 08/08/2018.  She continues to wear her fracture boot and is supposed to be nonweightbearing as much as possible however she comes in today without her crutches.  She continues to have pain and request a refill of her pain medication.  She works as a Educational psychologist prior to surgery.  Assessment & Plan: Visit Diagnoses:  1. Closed displaced fracture of fifth metatarsal bone of right foot, sequela     Plan: Counseled the patient that she should continue nonweightbearing is much as possible in her fracture boot.  Percocet refilled this visit.  Recommended that she remain out of work for at least another 2 weeks.  We will plan to see her back in 2 weeks and see how she is doing.  It is likely that we will not have her full weightbearing for up to another 4 weeks depending upon healing.  She will follow-up in 2 weeks.  Follow-Up Instructions: Return in about 2 weeks (around 09/20/2018).   Ortho Exam  Patient is alert, oriented, no adenopathy, well-dressed, normal affect, normal respiratory effort. Right foot incisions clean dry and intact.  She has good motion at the ankle.  There is no signs of cellulitis or infection.  She has good palpable pedal pulse.  Imaging: Xr Foot Complete Right  Result Date: 09/06/2018 Radiographs of the patient's right foot shows good position and alignment of the fracture fragments with internal fixation in place but minimal callus formation thus far.  No images are attached to the  encounter.  Labs: Lab Results  Component Value Date   REPTSTATUS 06/17/2008 FINAL 06/15/2008   CULT ESCHERICHIA COLI 06/15/2008   LABORGA ESCHERICHIA COLI 06/15/2008     Lab Results  Component Value Date   ALBUMIN 4.2 08/08/2018   ALBUMIN 4.1 12/26/2017   ALBUMIN 4.4 12/22/2017    There is no height or weight on file to calculate BMI.  Orders:  Orders Placed This Encounter  Procedures  . XR Foot Complete Right   Meds ordered this encounter  Medications  . oxyCODONE-acetaminophen (PERCOCET/ROXICET) 5-325 MG tablet    Sig: Take 1 tablet by mouth every 8 (eight) hours as needed.    Dispense:  20 tablet    Refill:  0     Procedures: No procedures performed  Clinical Data: No additional findings.  ROS:  All other systems negative, except as noted in the HPI. Review of Systems  Objective: Vital Signs: There were no vitals taken for this visit.  Specialty Comments:  No specialty comments available.  PMFS History: Patient Active Problem List   Diagnosis Date Noted  . Closed displaced fracture of fifth metatarsal bone of right foot 08/02/2018  . Dental abscess 12/25/2017  . Gastric ulcer 10/14/2013  . Transaminitis 10/14/2013  . Tobacco dependence 10/14/2013  . Hepatitis C 10/14/2013  . Thrombocytopenia ([facility]) 10/13/2013  . Duodenitis 10/12/2013  .  Alcoholism ([place]) 10/12/2013  . Abdominal pain, epigastric 10/12/2013  . ANKLE PAIN 01/01/2008   Past Medical History:  Diagnosis Date  . Back injury   . Gastric ulcer   . Gastric ulcer 10/14/2013  . Hepatitis C 10/14/2013  . History of kidney stones   . 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 Panic attack   . Pneumonia   . PONV (postoperative nausea and vomiting)     Family History  Problem Relation Age of Onset  . Cancer Mother 21       ovarian   . Hypertension Mother   . Stroke Mother   . Early death Father   . Alcohol abuse Father     Past Surgical History:  Procedure Laterality  Date  . BACK SURGERY    . BREAST SURGERY    . ESOPHAGOGASTRODUODENOSCOPY (EGD) WITH PROPOFOL N/A 10/14/2013   Procedure: ESOPHAGOGASTRODUODENOSCOPY (EGD) WITH PROPOFOL;  Surgeon: Rogene Houston, MD;  Location: [facility];  Service: Endoscopy;  Laterality: N/A;  . ORIF TOE FRACTURE Right 08/08/2018   Procedure: RIGHT FOOT INTERNAL FIXATION 5TH METATARSAL;  Surgeon: Newt Minion, MD;  Location: [facility];  Service: Orthopedics;  Laterality: Right;  . TONSILLECTOMY     Social History   Occupational History  . Not on file  Tobacco Use  . Smoking status: Current Every Day Smoker    Packs/day: 0.50    Years: 20.00    Pack years: 10.00    Types: Cigarettes  . Smokeless tobacco: Never Used  Substance and Sexual Activity  . Alcohol use: Yes    Frequency: Never    Comment: social   . Drug use: Yes    Types: Marijuana    Comment: last use July 31, 2018  . Sexual activity: Not Currently    Birth control/protection: None

## 2018-09-06 NOTE — Progress Notes (Unsigned)
Office Visit Note   Patient: Olivia Lester           Date of Birth: Aug 17, 1975           MRN: [id number] Visit Date: 09/06/2018              Requested by: No referring provider defined for this encounter. PCP: Soyla Dryer, PA-C  No chief complaint on file.     HPI: ***  Assessment & Plan: Visit Diagnoses: No diagnosis found.  Plan: ***  Follow-Up Instructions: No follow-ups on file.   Ortho Exam  Patient is alert, oriented, no adenopathy, well-dressed, normal affect, normal respiratory effort. ***  Imaging: No results found. No images are attached to the encounter.  Labs: Lab Results  Component Value Date   REPTSTATUS 06/17/2008 FINAL 06/15/2008   CULT ESCHERICHIA COLI 06/15/2008   LABORGA ESCHERICHIA COLI 06/15/2008     Lab Results  Component Value Date   ALBUMIN 4.2 08/08/2018   ALBUMIN 4.1 12/26/2017   ALBUMIN 4.4 12/22/2017    There is no height or weight on file to calculate BMI.  Orders:  No orders of the defined types were placed in this encounter.  No orders of the defined types were placed in this encounter.    Procedures: No procedures performed  Clinical Data: No additional findings.  ROS:  All other systems negative, except as noted in the HPI. Review of Systems  Objective: Vital Signs: There were no vitals taken for this visit.  Specialty Comments:  No specialty comments available.  PMFS History: Patient Active Problem List   Diagnosis Date Noted  . Closed displaced fracture of fifth metatarsal bone of right foot 08/02/2018  . Dental abscess 12/25/2017  . Gastric ulcer 10/14/2013  . Transaminitis 10/14/2013  . Tobacco dependence 10/14/2013  . Hepatitis C 10/14/2013  . Thrombocytopenia ([place]) 10/13/2013  . Duodenitis 10/12/2013  . Alcoholism ([facility]) 10/12/2013  . Abdominal pain, epigastric 10/12/2013  . ANKLE PAIN 01/01/2008   Past Medical History:  Diagnosis Date  . Back injury   . Gastric ulcer   .  Gastric ulcer 10/14/2013  . Hepatitis C 10/14/2013  . History of kidney stones   . 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 Panic attack   . Pneumonia   . PONV (postoperative nausea and vomiting)     Family History  Problem Relation Age of Onset  . Cancer Mother 17       ovarian   . Hypertension Mother   . Stroke Mother   . Early death Father   . Alcohol abuse Father     Past Surgical History:  Procedure Laterality Date  . BACK SURGERY    . BREAST SURGERY    . ESOPHAGOGASTRODUODENOSCOPY (EGD) WITH PROPOFOL N/A 10/14/2013   Procedure: ESOPHAGOGASTRODUODENOSCOPY (EGD) WITH PROPOFOL;  Surgeon: Rogene Houston, MD;  Location: [facility];  Service: Endoscopy;  Laterality: N/A;  . ORIF TOE FRACTURE Right 08/08/2018   Procedure: RIGHT FOOT INTERNAL FIXATION 5TH METATARSAL;  Surgeon: Newt Minion, MD;  Location: [facility];  Service: Orthopedics;  Laterality: Right;  . TONSILLECTOMY     Social History   Occupational History  . Not on file  Tobacco Use  . Smoking status: Current Every Day Smoker    Packs/day: 0.50    Years: 20.00    Pack years: 10.00    Types: Cigarettes  . Smokeless tobacco: Never Used  Substance and Sexual Activity  . Alcohol  use: Yes    Frequency: Never    Comment: social   . Drug use: Yes    Types: Marijuana    Comment: last use July 31, 2018  . Sexual activity: Not Currently    Birth control/protection: None

## 2018-09-12 ENCOUNTER — Telehealth (INDEPENDENT_AMBULATORY_CARE_PROVIDER_SITE_OTHER): Payer: Self-pay | Admitting: Orthopedic Surgery

## 2018-09-12 ENCOUNTER — Emergency Department
Admission: EM | Admit: 2018-09-12 | Discharge: 2018-09-13 | Disposition: A | Discharge status: Home / Self Care | Payer: Medicaid Other | Attending: Emergency Medicine | Admitting: Emergency Medicine

## 2018-09-12 ENCOUNTER — Encounter: Payer: Self-pay | Admitting: *Deleted

## 2018-09-12 ENCOUNTER — Other Ambulatory Visit: Payer: Self-pay

## 2018-09-12 DIAGNOSIS — F1910 Other psychoactive substance abuse, uncomplicated: Secondary | ICD-10-CM

## 2018-09-12 DIAGNOSIS — Z79899 Other long term (current) drug therapy: Secondary | ICD-10-CM | POA: Insufficient documentation

## 2018-09-12 DIAGNOSIS — F1920 Other psychoactive substance dependence, uncomplicated: Secondary | ICD-10-CM | POA: Diagnosis present

## 2018-09-12 DIAGNOSIS — F17210 Nicotine dependence, cigarettes, uncomplicated: Secondary | ICD-10-CM | POA: Insufficient documentation

## 2018-09-12 DIAGNOSIS — F19951 Other psychoactive substance use, unspecified with psychoactive substance-induced psychotic disorder with hallucinations: Secondary | ICD-10-CM | POA: Insufficient documentation

## 2018-09-12 LAB — URINE DRUG SCREEN, QUALITATIVE (ARMC ONLY)
Amphetamines, Ur Screen: POSITIVE — AB
Barbiturates, Ur Screen: NOT DETECTED
Benzodiazepine, Ur Scrn: NOT DETECTED
CANNABINOID 50 NG, UR ~~LOC~~: POSITIVE — AB
Cocaine Metabolite,Ur ~~LOC~~: POSITIVE — AB
MDMA (Ecstasy)Ur Screen: NOT DETECTED
Methadone Scn, Ur: NOT DETECTED
Opiate, Ur Screen: NOT DETECTED
PHENCYCLIDINE (PCP) UR S: NOT DETECTED
Tricyclic, Ur Screen: NOT DETECTED

## 2018-09-12 LAB — URINALYSIS, COMPLETE (UACMP) WITH MICROSCOPIC
Bilirubin Urine: NEGATIVE
GLUCOSE, UA: NEGATIVE mg/dL
Hgb urine dipstick: NEGATIVE
Ketones, ur: NEGATIVE mg/dL
Leukocytes,Ua: NEGATIVE
Nitrite: NEGATIVE
Protein, ur: NEGATIVE mg/dL
Specific Gravity, Urine: 1.014 (ref 1.005–1.030)
WBC, UA: NONE SEEN WBC/hpf (ref 0–5)
pH: 8 (ref 5.0–8.0)

## 2018-09-12 LAB — BASIC METABOLIC PANEL
Anion gap: 14 (ref 5–15)
BUN: 12 mg/dL (ref 6–20)
CO2: 27 mmol/L (ref 22–32)
Calcium: 10.4 mg/dL — ABNORMAL HIGH (ref 8.9–10.3)
Chloride: 97 mmol/L — ABNORMAL LOW (ref 98–111)
Creatinine, Ser: 0.66 mg/dL (ref 0.44–1.00)
GFR calc Af Amer: >60 mL/min (ref >60)
GFR calc non Af Amer: >60 mL/min (ref >60)
GLUCOSE: 85 mg/dL (ref 70–99)
Potassium: 3 mmol/L — ABNORMAL LOW (ref 3.5–5.1)
Sodium: 138 mmol/L (ref 135–145)

## 2018-09-12 LAB — CBC
HCT: 46.2 % — ABNORMAL HIGH (ref 36.0–46.0)
Hemoglobin: 15.4 g/dL — ABNORMAL HIGH (ref 12.0–15.0)
MCH: 32.2 pg (ref 26.0–34.0)
MCHC: 33.3 g/dL (ref 30.0–36.0)
MCV: 96.5 fL (ref 80.0–100.0)
Platelets: 246 10*3/uL (ref 150–400)
RBC: 4.79 MIL/uL (ref 3.87–5.11)
RDW: 14.9 % (ref 11.5–15.5)
WBC: 12.3 10*3/uL — ABNORMAL HIGH (ref 4.0–10.5)
nRBC: 0 % (ref 0.0–0.2)

## 2018-09-12 LAB — RAPID HIV SCREEN (HIV 1/2 AB+AG)
HIV 1/2 Antibodies: NONREACTIVE
HIV-1 P24 Antigen - HIV24: NONREACTIVE

## 2018-09-12 LAB — TSH: TSH: 2.166 u[IU]/mL (ref 0.350–4.500)

## 2018-09-12 LAB — AMMONIA: Ammonia: 20 umol/L (ref 9–35)

## 2018-09-12 LAB — MAGNESIUM: Magnesium: 2.1 mg/dL (ref 1.7–2.4)

## 2018-09-12 LAB — ETHANOL: Alcohol, Ethyl (B): <10 mg/dL (ref <10)

## 2018-09-12 MED ORDER — LORAZEPAM 1 MG PO TABS: 1.5000 mg | ORAL_TABLET | Freq: Once | ORAL | Status: DC | PRN

## 2018-09-12 MED ORDER — THIAMINE HCL 100 MG/ML IJ SOLN: 100.0000 mg | Freq: Once | INTRAMUSCULAR | Status: DC

## 2018-09-12 NOTE — ED Provider Notes (Signed)
-----------------------------------------   11:57 PM on 09/12/2018 -----------------------------------------   Assumed care from Dr. Jacqualine Code.  In short, Olivia Lester is a 43 y.o. female with a chief complaint of hallucinations.  Refer to the original H&P for additional details.  The patient's lab work is reassuring, at least those labs which will come back tonight.  Folate and RPR still pending.  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  The patient realizes this and has been given outpatient resources.  We discharged with usual and customary return precautions.   Hinda Kehr, MD 09/12/18 [id number]

## 2018-09-12 NOTE — Telephone Encounter (Signed)
Patient called to request an RX refill on her Percocet 5mg .  Patient uses [facility] in [place].  Thank you.

## 2018-09-12 NOTE — ED Provider Notes (Signed)
[facility] Emergency Department Provider Note ____________________________________________   First MD Initiated Contact with Patient 09/12/18 2215     (approximate)  I have reviewed the triage vital signs and the nursing notes.  HISTORY  Chief Complaint Foot Pain  HPI Olivia Lester is a 43 y.o. female here for evaluation of "worms" that she seen coming from the wound of her foot  Patient reports she is also seen worms over the last couple of days crawling out from areas on her skin, also from her right foot where she recently had surgery.  She reports she had a evaluation with a surgeon and they told her just a few days ago it look like it healed very well and she is now bearing weight on it without difficulty.  She does report only medication she takes oxycodone.  Denies a history of mental health issue.  Does report she is felt a little "weird" the last few days, just not feeling quite right.  Reports she thinks she needs some "blood work".  She does report a prior heavy history of alcohol abuse with withdrawals in the past, but reports she last drank alcohol 2 years ago and has not had any since that time.   Past Medical History:  Diagnosis Date  . Back injury   . Gastric ulcer   . Gastric ulcer 10/14/2013  . Hepatitis C 10/14/2013  . History of kidney stones   . 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 Panic attack   . Pneumonia   . PONV (postoperative nausea and vomiting)     Patient Active Problem List   Diagnosis Date Noted  . Closed displaced fracture of fifth metatarsal bone of right foot 08/02/2018  . Dental abscess 12/25/2017  . Gastric ulcer 10/14/2013  . Transaminitis 10/14/2013  . Tobacco dependence 10/14/2013  . Hepatitis C 10/14/2013  . Thrombocytopenia ([place]) 10/13/2013  . Duodenitis 10/12/2013  . Alcoholism ([place]) 10/12/2013  . Abdominal pain, epigastric 10/12/2013  . ANKLE PAIN 01/01/2008    Past Surgical History:   Procedure Laterality Date  . BACK SURGERY    . BREAST SURGERY    . ESOPHAGOGASTRODUODENOSCOPY (EGD) WITH PROPOFOL N/A 10/14/2013   Procedure: ESOPHAGOGASTRODUODENOSCOPY (EGD) WITH PROPOFOL;  Surgeon: Rogene Houston, MD;  Location: [facility];  Service: Endoscopy;  Laterality: N/A;  . ORIF TOE FRACTURE Right 08/08/2018   Procedure: RIGHT FOOT INTERNAL FIXATION 5TH METATARSAL;  Surgeon: Newt Minion, MD;  Location: [place];  Service: Orthopedics;  Laterality: Right;  . TONSILLECTOMY      Prior to Admission medications   Medication Sig Start Date End Date Taking? Authorizing Provider  oxyCODONE-acetaminophen (PERCOCET/ROXICET) 5-325 MG tablet Take 1 tablet by mouth every 8 (eight) hours as needed. 09/06/18   Rayburn, Neta Mends, PA-C  pantoprazole (PROTONIX) 40 MG tablet Take 40 mg by mouth 2 (two) times daily.     [provider]    Allergies Patient has no known allergies.  Family History  Problem Relation Age of Onset  . Cancer Mother 35       ovarian   . Hypertension Mother   . Stroke Mother   . Early death Father   . Alcohol abuse Father     Social History Social History   Tobacco Use  . Smoking status: Current Every Day Smoker    Packs/day: 0.50    Years: 20.00    Pack years: 10.00    Types: Cigarettes  .  Smokeless tobacco: Never Used  Substance Use Topics  . Alcohol use: Yes    Frequency: Never    Comment: social   . Drug use: Yes    Types: Marijuana    Comment: last use July 31, 2018    Review of Systems Constitutional: No fever/chills Eyes: No visual changes except she feels a little bit like she is "seeing things". ENT: No sore throat. Cardiovascular: Denies chest pain. Respiratory: Denies shortness of breath. Gastrointestinal: No abdominal pain.   Genitourinary: Negative for dysuria. Musculoskeletal: Negative for back pain.  Reports her foot seems to be her she was told that healing well and she is able to bear weight on it now.  She has  not seen any drainage from except where she saw "worms" crawling out of it. Lower Extremities  No edema. Normal DP/PT pulses bilateral with good cap refill.  Normal neuro-motor function lower extremities bilateral.  RIGHT Right lower extremity demonstrates normal strength, good use of all muscles. No edema bruising or contusions of the right hip, right knee, right ankle. Full range of motion of the right lower extremity without pain. No pain on axial loading. No evidence of trauma. Scar on left foot is clear, dry and intact with no surrounding erythema or induration. Appears WELL healing. Absolutely no worms or bugs.   LEFT Left lower extremity demonstrates normal strength, good use of all muscles. No edema bruising or contusions of the hip,  knee, ankle. Full range of motion of the left lower extremity without pain. No pain on axial loading. No evidence of trauma.   Skin: Negative for rash. Neurological: Negative for headaches, areas of focal weakness or numbness. ____________________________________________   PHYSICAL EXAM:  VITAL SIGNS: ED Triage Vitals  Enc Vitals Group     BP 09/12/18 2110 (!) 158/101     Pulse Rate 09/12/18 2110 (!) 115     Resp 09/12/18 2110 18     Temp 09/12/18 2110 98.7 F (37.1 C)     Temp Source 09/12/18 2110 Oral     SpO2 09/12/18 2110 100 %     Weight 09/12/18 2112 110 lb (49.9 kg)     Height 09/12/18 2112 5\' 5"  (1.651 m)     Head Circumference --      Peak Flow --      Pain Score 09/12/18 2119 10     Pain Loc --      Pain Edu? --      Excl. in [place]? --    Constitutional: Alert and oriented. Well appearing and in no acute distress. Eyes: Conjunctivae are normal.  There is no evidence of entrapment of any extraocular muscles.  Her extraocular movements are normal.  No nystagmus is observed. Head: Atraumatic. Nose: No congestion/rhinnorhea. Mouth/Throat: Mucous membranes are moist. Neck: No stridor.  Cardiovascular: Normal rate, regular rhythm.  Grossly normal heart sounds.  Good peripheral circulation. Respiratory: Normal respiratory effort.  No retractions. Lungs CTAB. Gastrointestinal: Soft and nontender. No distention. Musculoskeletal: No lower extremity tenderness nor edema. Neurologic:  Normal speech and language. No gross focal neurologic deficits are appreciated.  Skin:  Skin is warm, dry and intact. No rash noted. Psychiatric: Mood and affect are normal. Speech and behavior are normal. ____________________________________________   LABS (all labs ordered are listed, but only abnormal results are displayed)  Labs Reviewed  CBC - Abnormal; Notable for the following components:      Result Value   WBC 12.3 (*)    Hemoglobin 15.4 (*)  HCT 46.2 (*)    All other components within normal limits  URINALYSIS, COMPLETE (UACMP) WITH MICROSCOPIC - Abnormal; Notable for the following components:   Color, Urine YELLOW (*)    APPearance CLOUDY (*)    Bacteria, UA RARE (*)    All other components within normal limits  URINE DRUG SCREEN, QUALITATIVE ([facility] ONLY) - Abnormal; Notable for the following components:   Amphetamines, Ur Screen POSITIVE (*)    Cocaine Metabolite,Ur [place] POSITIVE (*)    Cannabinoid 50 Ng, Ur [place] POSITIVE (*)    All other components within normal limits  ETHANOL  AMMONIA  BASIC METABOLIC PANEL  MAGNESIUM  TSH  RPR  FOLATE  RAPID HIV SCREEN (HIV 1/2 AB+AG)   ____________________________________________  EKG   ____________________________________________  RADIOLOGY   ____________________________________________   PROCEDURES  Procedure(s) performed: None  Procedures  Critical Care performed: No  ____________________________________________   INITIAL IMPRESSION / ASSESSMENT AND PLAN / ED COURSE  Pertinent labs & imaging results that were available during my care of the patient were reviewed by me and considered in my medical decision making (see chart for details).   Patient  presents for evaluation of "worms" on her body and also crawling out of her wound.  However, her surgical wound is clearly well healed over and there is no sign of infection or an open wound that a worm could possibly have crawled out of.  Her symptoms and she reports starting to feel like she is seeing things over the last few days are quite concerning that there could be a potential medical etiology for the symptoms.  She does report a heavy history of drinking in the past as well, and I think she warrants a very full evaluation.  Will order MRI of the brain, she denies ongoing alcohol use denies that she has been drinking for 2 years time now.  She does however have heavy prior use.  Would entertain potential Warnicke type presentation, also will evaluate and screen for syphilis, thyroid dysfunction, electrolyte abnormality, infectious etiology though her exam demonstrates no sign of infection.  She has no headache, no meningismus, is afebrile.  She is a little bit tachycardic and a little bit hypertensive but denies use of benzodiazepines or any other medication other than oxycodone which he is not taking daily postoperatively, and also denies a history of alcohol use in the last 2 years. Withdrawal doesn't seem to clearly fit picture.  Have also been ordered a psychiatry consult to help me further understand and provide recommendations on potential etiology of what appear to be apparent hallucinations.     ----------------------------------------- 11:26 PM on 09/12/2018 -----------------------------------------  Patient drug screen is quite notably positive.  I discussed it with her, and she reports that she forgot to mention that she is staying with a friend and they had been smoking some things and she was not really sure what it was thought maybe it was marijuana but she reports that it is quite possible that it was cocaine or methamphetamine or both now that she thinks about it. Leola Brazil, psychiatry  NP reports she is seeing the patient now and given the patient's positive drug screen thinks also the patient to be discharged and will provide information on substance abuse treatment programs.  Patient denies any other hallucinations, no desire to harm herself.  She is functioning normally, ambulatory, in no distress.  Counseled patient, and she reports she will stop use and is comfortable the plan for discharge if the rest  of her labs look okay.  Ongoing care assigned to Dr. Karma Greaser, current plan is to follow-up on lab work and I suspect likely discharge if no notable lab abnormalities.  MRI canceled, substance abuse seems to explain her symptoms well. ____________________________________________   FINAL CLINICAL IMPRESSION(S) / ED DIAGNOSES  Final diagnoses:  Polysubstance abuse ([place])  Drug induced hallucinations ([place])        Note:  This document was prepared using Dragon voice recognition software and may include unintentional dictation errors       Delman Kitten, MD 09/12/18 2329

## 2018-09-12 NOTE — Discharge Instructions (Signed)

## 2018-09-12 NOTE — ED Notes (Signed)
Pt. Acuity level changed, due to conversation with patient about additional complaints.

## 2018-09-12 NOTE — ED Notes (Addendum)
Pt to the er for pain to the right foot. Pt had surgery 5 weeks ago on the foot with no problems. Pt says yesterday she started running fever and today when she got out of the shower she saw worms in her foot. Pt then states she saw a black worm come out of the back of her right leg. Pt then states she saw worms on a cat but she hasnt been around the cat in several days. Pt is manic and easily distracted. Pt then says she saw white worms in her crotch. Pt incision site is healed and shows no signs of infection. Pt states she then just feels bad. Pt says she is having trouble with her eyes. Pt says she is trying to get back to work. Asked pt about medications and pt is still taking percocet from 5 weeks ago. Told pt she needs to stop taking the percocet and asked if she thought those were making her hallucinate. Pt says no.

## 2018-09-12 NOTE — ED Triage Notes (Signed)
Pt has pain in right foot and is wearing a splint boot.  Pt had surgery on foot 5 weeks ago.  Today, pt says worms are coming out of incision.  No worms noted in triage.  Pt alert.

## 2018-09-12 NOTE — Telephone Encounter (Signed)
08/08/2018 right 5th MT internal fixation. Requesting refill on Oxycodone 5/325 last refill was 09/06/2018 #20 pt has had a total qty of 110 tabs in the past month per narcotic database.

## 2018-09-13 ENCOUNTER — Telehealth (INDEPENDENT_AMBULATORY_CARE_PROVIDER_SITE_OTHER): Payer: Self-pay

## 2018-09-13 ENCOUNTER — Telehealth (INDEPENDENT_AMBULATORY_CARE_PROVIDER_SITE_OTHER): Payer: Self-pay | Admitting: Orthopedic Surgery

## 2018-09-13 DIAGNOSIS — F1920 Other psychoactive substance dependence, uncomplicated: Secondary | ICD-10-CM | POA: Diagnosis present

## 2018-09-13 LAB — FOLATE: FOLATE: 31 ng/mL (ref >5.9)

## 2018-09-13 NOTE — Consult Note (Signed)
[facility] Face-to-Face Psychiatry Consult   Reason for Consult:  hallucinations Referring Physician:  Dr. Jacqualine Code Patient Identification: Olivia Lester MRN:  [id number] Principal Diagnosis: Polysubstance (excluding opioids) dependence, daily use ([place]) Diagnosis:  Principal Problem:   Polysubstance (excluding opioids) dependence, daily use ([place])   Total Time spent with patient: 1 hour  Subjective:  I am not crazy. There is something wrong with my leg."  Olivia Lester is a 43 y.o. female patient presented to [facility] ED due to complaints of her right leg post surgery. The patient came stated that she had been hallucinating for the past few days.  The patient was seen face-to-face by this provider; chart reviewed and consulted with Dr. Jacqualine Code and Dr. Bary Leriche on 09/12/2018 that the patient does not meet criteria for inpatient admission. Due to the patient denying her hallucinations is stamping from any mental problems. The patient stated it is her medical health. As discussed with Dr. Jacqualine Code that There is no evidence of an acute psychiatric issue the patient encounter and it is believed that her hallucinations are likely due to methamphetamine and cocaine use as well as concurrent marijuana. On evaluation the patient is alert and oriented x4, calm and cooperative, and mood-congruent with affect. The patient speech is slurring, but the patient states that is how she talks due to her hearing loss in the right ear. The patient does not appear to be responding to internal or external stimuli. Neither is the patient presenting with any delusional thinking. The patient denies any suicidal, homicidal, or self-harm ideations. The patient is not presenting with any psychotic or paranoid behaviors at this time. During an encounter with the patient, he/she was able to answer questions appropriately.  Collaborator: Holley Raring [phone] (Sister) unable to connect with the patient sister.  HPI:   Past Psychiatric  History:  Poly-substance Use disorder  Risk to Self:  No Risk to Others:  No Prior Inpatient Therapy:  No Prior Outpatient Therapy:  Yes  Past Medical History:  Past Medical History:  Diagnosis Date  . Back injury   . Gastric ulcer   . Gastric ulcer 10/14/2013  . Hepatitis C 10/14/2013  . History of kidney stones   . 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 Panic attack   . Pneumonia   . PONV (postoperative nausea and vomiting)     Past Surgical History:  Procedure Laterality Date  . BACK SURGERY    . BREAST SURGERY    . ESOPHAGOGASTRODUODENOSCOPY (EGD) WITH PROPOFOL N/A 10/14/2013   Procedure: ESOPHAGOGASTRODUODENOSCOPY (EGD) WITH PROPOFOL;  Surgeon: Rogene Houston, MD;  Location: [facility];  Service: Endoscopy;  Laterality: N/A;  . ORIF TOE FRACTURE Right 08/08/2018   Procedure: RIGHT FOOT INTERNAL FIXATION 5TH METATARSAL;  Surgeon: Newt Minion, MD;  Location: [place];  Service: Orthopedics;  Laterality: Right;  . TONSILLECTOMY     Family History:  Family History  Problem Relation Age of Onset  . Cancer Mother 38       ovarian   . Hypertension Mother   . Stroke Mother   . Early death Father   . Alcohol abuse Father    Family Psychiatric  History: None provided Social History:  Poly-substance use Social History   Substance and Sexual Activity  Alcohol Use Yes  . Frequency: Never   Comment: social      Social History   Substance and Sexual Activity  Drug Use Yes  . Types: Marijuana   Comment:  last use July 31, 2018    Social History   Socioeconomic History  . Marital status: Divorced    Spouse name: Not on file  . Number of children: Not on file  . Years of education: Not on file  . Highest education level: Not on file  Occupational History  . Not on file  Social Needs  . Financial resource strain: Not on file  . Food insecurity:    Worry: Not on file    Inability: Not on file  . Transportation needs:    Medical: Not on file     Non-medical: Not on file  Tobacco Use  . Smoking status: Current Every Day Smoker    Packs/day: 0.50    Years: 20.00    Pack years: 10.00    Types: Cigarettes  . Smokeless tobacco: Never Used  Substance and Sexual Activity  . Alcohol use: Yes    Frequency: Never    Comment: social   . Drug use: Yes    Types: Marijuana    Comment: last use July 31, 2018  . Sexual activity: Not Currently    Birth control/protection: None  Lifestyle  . Physical activity:    Days per week: Not on file    Minutes per session: Not on file  . Stress: Not on file  Relationships  . Social connections:    Talks on phone: Not on file    Gets together: Not on file    Attends religious service: Not on file    Active member of club or organization: Not on file    Attends meetings of clubs or organizations: Not on file    Relationship status: Not on file  Other Topics Concern  . Not on file  Social History Narrative  . Not on file   Additional Social History:    Allergies:  No Known Allergies  Labs:  Results for orders placed or performed during the hospital encounter of 09/12/18 (from the past 48 hour(s))  CBC     Status: Abnormal   Collection Time: 09/12/18 10:28 PM  Result Value Ref Range   WBC 12.3 (H) 4.0 - 10.5 K/uL   RBC 4.79 3.87 - 5.11 MIL/uL   Hemoglobin 15.4 (H) 12.0 - 15.0 g/dL   HCT 46.2 (H) 36.0 - 46.0 %   MCV 96.5 80.0 - 100.0 fL   MCH 32.2 26.0 - 34.0 pg   MCHC 33.3 30.0 - 36.0 g/dL   RDW 14.9 11.5 - 15.5 %   Platelets 246 150 - 400 K/uL   nRBC 0.0 0.0 - 0.2 %    Comment: Performed at [facility], [address] [place], [place] [address]  Basic metabolic panel     Status: Abnormal   Collection Time: 09/12/18 10:28 PM  Result Value Ref Range   Sodium 138 135 - 145 mmol/L   Potassium 3.0 (L) 3.5 - 5.1 mmol/L   Chloride 97 (L) 98 - 111 mmol/L   CO2 27 22 - 32 mmol/L   Glucose, Bld 85 70 - 99 mg/dL   BUN 12 6 - 20 mg/dL   Creatinine, Ser 0.66 0.44 - 1.00  mg/dL   Calcium 10.4 (H) 8.9 - 10.3 mg/dL   GFR calc non Af Amer >60 >60 mL/min   GFR calc Af Amer >60 >60 mL/min   Anion gap 14 5 - 15    Comment: Performed at [facility], [address], [place], [place] [address]  Magnesium  Status: None   Collection Time: 09/12/18 10:28 PM  Result Value Ref Range   Magnesium 2.1 1.7 - 2.4 mg/dL    Comment: Performed at [facility], [address] [place], [place] [address]  TSH     Status: None   Collection Time: 09/12/18 10:28 PM  Result Value Ref Range   TSH 2.166 0.350 - 4.500 uIU/mL    Comment: Performed by a 3rd Generation assay with a functional sensitivity of <=0.01 uIU/mL. Performed at [facility], [address] [place], [place] [address]   Urinalysis, Complete w Microscopic     Status: Abnormal   Collection Time: 09/12/18 10:28 PM  Result Value Ref Range   Color, Urine YELLOW (A) YELLOW   APPearance CLOUDY (A) CLEAR   Specific Gravity, Urine 1.014 1.005 - 1.030   pH 8.0 5.0 - 8.0   Glucose, UA NEGATIVE NEGATIVE mg/dL   Hgb urine dipstick NEGATIVE NEGATIVE   Bilirubin Urine NEGATIVE NEGATIVE   Ketones, ur NEGATIVE NEGATIVE mg/dL   Protein, ur NEGATIVE NEGATIVE mg/dL   Nitrite NEGATIVE NEGATIVE   Leukocytes,Ua NEGATIVE NEGATIVE   RBC / HPF 11-20 0 - 5 RBC/hpf   WBC, UA NONE SEEN 0 - 5 WBC/hpf   Bacteria, UA RARE (A) NONE SEEN   Squamous Epithelial / LPF 11-20 0 - 5   Mucus PRESENT     Comment: Performed at [facility], [address], [place], [place] [address]  Urine Drug Screen, Qualitative ([facility] only)     Status: Abnormal   Collection Time: 09/12/18 10:28 PM  Result Value Ref Range   Tricyclic, Ur Screen NONE DETECTED NONE DETECTED   Amphetamines, Ur Screen POSITIVE (A) NONE DETECTED   MDMA (Ecstasy)Ur Screen NONE DETECTED NONE DETECTED   Cocaine Metabolite,Ur [place] POSITIVE (A) NONE DETECTED   Opiate, Ur Screen NONE DETECTED NONE DETECTED   Phencyclidine (PCP) Ur S NONE  DETECTED NONE DETECTED   Cannabinoid 50 Ng, Ur [place] POSITIVE (A) NONE DETECTED   Barbiturates, Ur Screen NONE DETECTED NONE DETECTED   Benzodiazepine, Ur Scrn NONE DETECTED NONE DETECTED   Methadone Scn, Ur NONE DETECTED NONE DETECTED    Comment: (NOTE) Tricyclics + metabolites, urine    Cutoff 1000 ng/mL Amphetamines + metabolites, urine  Cutoff 1000 ng/mL MDMA (Ecstasy), urine              Cutoff 500 ng/mL Cocaine Metabolite, urine          Cutoff 300 ng/mL Opiate + metabolites, urine        Cutoff 300 ng/mL Phencyclidine (PCP), urine         Cutoff 25 ng/mL Cannabinoid, urine                 Cutoff 50 ng/mL Barbiturates + metabolites, urine  Cutoff 200 ng/mL Benzodiazepine, urine              Cutoff 200 ng/mL Methadone, urine                   Cutoff 300 ng/mL The urine drug screen provides only a preliminary, unconfirmed analytical test result and should not be used for non-medical purposes. Clinical consideration and professional judgment should be applied to any positive drug screen result due to possible interfering substances. A more specific alternate chemical method must be used in order to obtain a confirmed analytical result. Gas chromatography / mass spectrometry (GC/MS) is the preferred confirmat ory method. Performed at [facility], [place]  [address] [place], [place] [address]   Ethanol     Status: None   Collection Time: 09/12/18 10:28 PM  Result Value Ref Range   Alcohol, Ethyl (B) <10 <10 mg/dL    Comment: (NOTE) Lowest detectable limit for serum alcohol is 10 mg/dL. For medical purposes only. Performed at [facility], [address] [place], [place] [address]   Folate     Status: None   Collection Time: 09/12/18 10:28 PM  Result Value Ref Range   Folate 31.0 >5.9 ng/mL    Comment: Performed at [facility], [place] [address], [place] [address]  Rapid HIV screen (HIV 1/2 Ab+Ag) ([facility] Only)     Status: None   Collection  Time: 09/12/18 10:28 PM  Result Value Ref Range   HIV-1 P24 Antigen - HIV24 NON REACTIVE NON REACTIVE   HIV 1/2 Antibodies NON REACTIVE NON REACTIVE   Interpretation (HIV Ag Ab)      A non reactive test result means that HIV 1 or HIV 2 antibodies and HIV 1 p24 antigen were not detected in the specimen.    Comment: Performed at [facility], [address] [place], [place] [address]  Ammonia     Status: None   Collection Time: 09/12/18 10:28 PM  Result Value Ref Range   Ammonia 20 9 - 35 umol/L    Comment: Performed at [facility], [address] [place], [place] [address]    No current facility-administered medications for this encounter.    Current Outpatient Medications  Medication Sig Dispense Refill  . oxyCODONE-acetaminophen (PERCOCET/ROXICET) 5-325 MG tablet Take 1 tablet by mouth every 8 (eight) hours as needed. 20 tablet 0  . pantoprazole (PROTONIX) 40 MG tablet Take 40 mg by mouth 2 (two) times daily.       Musculoskeletal: Strength & Muscle Tone: decreased Gait & Station: unsteady Patient leans: Left  Psychiatric Specialty Exam: Physical Exam  Nursing note and vitals reviewed. Constitutional: She is oriented to person, place, and time. She appears well-developed and well-nourished.  HENT:  Head: Normocephalic and atraumatic.  Eyes: Pupils are equal, round, and reactive to light. Conjunctivae and EOM are normal.  Neck: Normal range of motion. Neck supple.  Cardiovascular: Normal rate and regular rhythm.  Respiratory: Effort normal and breath sounds normal.  Musculoskeletal: Normal range of motion.  Neurological: She is alert and oriented to person, place, and time. She has normal reflexes.  Skin: Skin is warm and dry.  Psychiatric: Thought content normal.    Review of Systems  Constitutional: Positive for malaise/fatigue and weight loss.  Eyes: Negative.   Respiratory: Negative.   Cardiovascular: Negative.   Gastrointestinal: Negative.    Genitourinary: Negative.   Musculoskeletal: Negative.   Skin: Negative.   Neurological: Negative.   Endo/Heme/Allergies: Negative.   Psychiatric/Behavioral: Positive for substance abuse.    Blood pressure 110/85, pulse 73, temperature 99 F (37.2 C), temperature source Oral, resp. rate 18, height 5\' 5"  (1.651 m), weight 49.9 kg, last menstrual period 09/03/2018, SpO2 99 %.Body mass index is 18.3 kg/m.  General Appearance: Well Groomed  Eye Contact:  Negative  Speech:  Normal Rate  Volume:  Normal  Mood:  Anxious  Affect:  Appropriate  Thought Process:  Coherent  Orientation:  Full (Time, Place, and Person)  Thought Content:  Logical  Suicidal Thoughts:  No  Homicidal Thoughts:  No  Memory:  Immediate;   Good  Judgement:  Fair  Insight:  Lacking  Psychomotor  Activity:  Normal  Concentration:  Concentration: Good  Recall:  Good  Fund of Knowledge:  Good  Language:  Fair  Akathisia:  Negative  Handed:  Right  AIMS (if indicated):     Assets:  Social Support  ADL's:  Intact  Cognition:  WNL  Sleep:   2-4 hours     Treatment Plan Summary: Plan Outpatient substance treatment  Disposition: No evidence of imminent risk to self or others at present.   Patient does not meet criteria for psychiatric inpatient admission. Supportive therapy provided about ongoing stressors. Discussed crisis plan, support from social network, calling 911, coming to the Emergency Department, and calling Suicide Hotline.  Lamont Dowdy, NP 09/13/2018 1:41 AM

## 2018-09-13 NOTE — ED Notes (Signed)
Pt. Discharged from ED.  Pt. Had removed own IV, without telling anyone.

## 2018-09-13 NOTE — Telephone Encounter (Signed)
This has been addressed. No refill at this time.

## 2018-09-13 NOTE — Telephone Encounter (Signed)
Too soon for refill,

## 2018-09-13 NOTE — Telephone Encounter (Signed)
I called pt to advise of message below. She was very upset and states that she has been walking on her foot because she does not have anyone to help her at home and that she is in pain and needs something for this. I advised that we can not do that right now and the pt questioned if this is because of "what happened at the hospital" I asked if she had been to the hospital recently and she states no that she has not. I advised that Dr. Sharol Given can not refill this medication at this time but she should avoid weight bearing and elevate as much as possible. Pt asked multiple times for different types of pain medication and advised we are happy to see her but can not refill at this time. Pt has an appt on Tuesday and can discuss at that time.

## 2018-09-13 NOTE — Telephone Encounter (Signed)
Pt called back and was verbally abusive to the front desk staff demanding to speak with me. I answered the call and pt used excessive profanity and advised that she was going to come to the office and pick up her records and go and see another doctor. She is very upset about not getting rx and continued to use profanity until she hung up the phone.

## 2018-09-13 NOTE — Telephone Encounter (Signed)
Patient called and stated needs pain medication refill Percocet 5. Appointment is not until 09/18/18.  Call [phone].  May have to call (I did) twice due to [employer] recording issues.  Patient stated in pain and only has 1 pill left.

## 2018-09-14 LAB — RPR: RPR Ser Ql: NONREACTIVE

## 2018-09-18 ENCOUNTER — Ambulatory Visit (INDEPENDENT_AMBULATORY_CARE_PROVIDER_SITE_OTHER): Payer: Medicaid Other | Admitting: Orthopedic Surgery

## 2018-10-04 ENCOUNTER — Ambulatory Visit: Payer: Medicaid Other | Admitting: Physician Assistant

## 2018-10-16 ENCOUNTER — Other Ambulatory Visit: Payer: Self-pay

## 2018-10-16 ENCOUNTER — Encounter (HOSPITAL_COMMUNITY): Payer: Self-pay | Admitting: Emergency Medicine

## 2018-10-16 ENCOUNTER — Emergency Department (HOSPITAL_COMMUNITY)
Admission: EM | Admit: 2018-10-16 | Discharge: 2018-10-16 | Disposition: A | Discharge status: Home / Self Care | Payer: Medicaid Other | Attending: Emergency Medicine | Admitting: Emergency Medicine

## 2018-10-16 DIAGNOSIS — F17210 Nicotine dependence, cigarettes, uncomplicated: Secondary | ICD-10-CM | POA: Insufficient documentation

## 2018-10-16 DIAGNOSIS — E860 Dehydration: Secondary | ICD-10-CM | POA: Insufficient documentation

## 2018-10-16 DIAGNOSIS — R112 Nausea with vomiting, unspecified: Secondary | ICD-10-CM

## 2018-10-16 DIAGNOSIS — R197 Diarrhea, unspecified: Secondary | ICD-10-CM

## 2018-10-16 DIAGNOSIS — Z79899 Other long term (current) drug therapy: Secondary | ICD-10-CM | POA: Insufficient documentation

## 2018-10-16 DIAGNOSIS — K047 Periapical abscess without sinus: Secondary | ICD-10-CM | POA: Insufficient documentation

## 2018-10-16 DIAGNOSIS — F1020 Alcohol dependence, uncomplicated: Secondary | ICD-10-CM | POA: Insufficient documentation

## 2018-10-16 LAB — URINALYSIS, ROUTINE W REFLEX MICROSCOPIC
Bilirubin Urine: NEGATIVE
Glucose, UA: NEGATIVE mg/dL
Hgb urine dipstick: NEGATIVE
Ketones, ur: NEGATIVE mg/dL
Leukocytes,Ua: NEGATIVE
NITRITE: NEGATIVE
Protein, ur: NEGATIVE mg/dL
Specific Gravity, Urine: 1.023 (ref 1.005–1.030)
pH: 5 (ref 5.0–8.0)

## 2018-10-16 LAB — COMPREHENSIVE METABOLIC PANEL
ALT: 38 U/L (ref 0–44)
AST: 39 U/L (ref 15–41)
Albumin: 5.4 g/dL — ABNORMAL HIGH (ref 3.5–5.0)
Alkaline Phosphatase: 61 U/L (ref 38–126)
Anion gap: 15 (ref 5–15)
BUN: 20 mg/dL (ref 6–20)
CO2: 25 mmol/L (ref 22–32)
Calcium: 10 mg/dL (ref 8.9–10.3)
Chloride: 105 mmol/L (ref 98–111)
Creatinine, Ser: 0.76 mg/dL (ref 0.44–1.00)
GFR calc Af Amer: >60 mL/min (ref >60)
Glucose, Bld: 121 mg/dL — ABNORMAL HIGH (ref 70–99)
Potassium: 3.8 mmol/L (ref 3.5–5.1)
Sodium: 145 mmol/L (ref 135–145)
Total Bilirubin: 0.5 mg/dL (ref 0.3–1.2)
Total Protein: 9.5 g/dL — ABNORMAL HIGH (ref 6.5–8.1)

## 2018-10-16 LAB — CBC
HCT: 52.7 % — ABNORMAL HIGH (ref 36.0–46.0)
Hemoglobin: 17.2 g/dL — ABNORMAL HIGH (ref 12.0–15.0)
MCH: 32.2 pg (ref 26.0–34.0)
MCHC: 32.6 g/dL (ref 30.0–36.0)
MCV: 98.7 fL (ref 80.0–100.0)
Platelets: 274 10*3/uL (ref 150–400)
RBC: 5.34 MIL/uL — ABNORMAL HIGH (ref 3.87–5.11)
RDW: 13.8 % (ref 11.5–15.5)
WBC: 17.5 10*3/uL — ABNORMAL HIGH (ref 4.0–10.5)
nRBC: 0 % (ref 0.0–0.2)

## 2018-10-16 LAB — RAPID URINE DRUG SCREEN, HOSP PERFORMED
Amphetamines: NOT DETECTED
Barbiturates: NOT DETECTED
Benzodiazepines: NOT DETECTED
COCAINE: NOT DETECTED
Opiates: NOT DETECTED
Tetrahydrocannabinol: POSITIVE — AB

## 2018-10-16 LAB — LIPASE, BLOOD: Lipase: 28 U/L (ref 11–51)

## 2018-10-16 MED ORDER — LOPERAMIDE HCL 2 MG PO CAPS
4.0000 mg | ORAL_CAPSULE | Freq: Once | ORAL | Status: AC
  Administered 2018-10-16: 4 mg via ORAL
  Filled 2018-10-16: qty 2

## 2018-10-16 MED ORDER — SODIUM CHLORIDE 0.9 % IV BOLUS
1000.0000 mL | Freq: Once | INTRAVENOUS | Status: AC
  Administered 2018-10-16: 1000 mL via INTRAVENOUS

## 2018-10-16 MED ORDER — ONDANSETRON HCL 4 MG/2ML IJ SOLN
4.0000 mg | Freq: Once | INTRAMUSCULAR | Status: AC | PRN
  Administered 2018-10-16: 4 mg via INTRAVENOUS
  Filled 2018-10-16: qty 2

## 2018-10-16 MED ORDER — SODIUM CHLORIDE 0.9% FLUSH: 3.0000 mL | Freq: Once | INTRAVENOUS | Status: DC

## 2018-10-16 MED ORDER — ONDANSETRON HCL 4 MG/2ML IJ SOLN
4.0000 mg | Freq: Once | INTRAMUSCULAR | Status: DC
  Filled 2018-10-16: qty 2

## 2018-10-16 NOTE — ED Notes (Signed)
Pt informed of the need for a urine specimen, pt states she is unable to go at this time

## 2018-10-16 NOTE — ED Provider Notes (Signed)
[facility] EMERGENCY DEPARTMENT Provider Note   CSN: [id number] Arrival date & time: 10/16/18  0542  Time seen 6:20 AM  History   Chief Complaint Chief Complaint  Patient presents with  . Emesis    HPI Olivia Lester is a 43 y.o. female.     HPI patient states that he had a funeral yesterday and afterwards they were eating around 8 PM.  She states she put 1 piece of romaine lettuce on a sandwich that she ate.  About an hour later she started having vomiting and diarrhea.  She states she has had over 20 episodes of each.  She denies any blood.  She complains of body aches and low back pain.  She denies fever.  She is having upper abdominal pain that she describes as sharp.  She also states she has an abscessed tooth for a few weeks.  She has not been around anybody else who is ill that she is aware of.  Also complains of some low back pain. Denies any drug use.    PCP Soyla Dryer, PA-C   Past Medical History:  Diagnosis Date  . Back injury   . Gastric ulcer   . Gastric ulcer 10/14/2013  . Hepatitis C 10/14/2013  . History of kidney stones   . 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 Panic attack   . Pneumonia   . PONV (postoperative nausea and vomiting)     Patient Active Problem List   Diagnosis Date Noted  . Polysubstance (excluding opioids) dependence, daily use ([place]) 09/13/2018  . Closed displaced fracture of fifth metatarsal bone of right foot 08/02/2018  . Dental abscess 12/25/2017  . Gastric ulcer 10/14/2013  . Transaminitis 10/14/2013  . Tobacco dependence 10/14/2013  . Hepatitis C 10/14/2013  . Thrombocytopenia ([place]) 10/13/2013  . Duodenitis 10/12/2013  . Alcoholism ([place]) 10/12/2013  . Abdominal pain, epigastric 10/12/2013  . ANKLE PAIN 01/01/2008    Past Surgical History:  Procedure Laterality Date  . BACK SURGERY    . BREAST SURGERY    . ESOPHAGOGASTRODUODENOSCOPY (EGD) WITH PROPOFOL N/A 10/14/2013   Procedure:  ESOPHAGOGASTRODUODENOSCOPY (EGD) WITH PROPOFOL;  Surgeon: Rogene Houston, MD;  Location: [facility];  Service: Endoscopy;  Laterality: N/A;  . ORIF TOE FRACTURE Right 08/08/2018   Procedure: RIGHT FOOT INTERNAL FIXATION 5TH METATARSAL;  Surgeon: Newt Minion, MD;  Location: [place];  Service: Orthopedics;  Laterality: Right;  . TONSILLECTOMY       OB History    Gravida  7   Para  3   Term  3   Preterm      AB  4   Living  3     SAB  2   TAB  2   Ectopic      Multiple      Live Births  3            Home Medications    Patient states she takes ibuprofen 800 mg twice a day  Prior to Admission medications   Medication Sig Start Date End Date Taking? Authorizing Provider  oxyCODONE-acetaminophen (PERCOCET/ROXICET) 5-325 MG tablet Take 1 tablet by mouth every 8 (eight) hours as needed. 09/06/18   Rayburn, Neta Mends, PA-C  pantoprazole (PROTONIX) 40 MG tablet Take 40 mg by mouth 2 (two) times daily.     [provider]    Family History Family History  Problem Relation Age of Onset  . Cancer Mother 17  ovarian   . Hypertension Mother   . Stroke Mother   . Early death Father   . Alcohol abuse Father     Social History Social History   Tobacco Use  . Smoking status: Current Every Day Smoker    Packs/day: 0.50    Years: 20.00    Pack years: 10.00    Types: Cigarettes  . Smokeless tobacco: Never Used  Substance Use Topics  . Alcohol use: Yes    Frequency: Never    Comment: social   . 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   Review of Systems Review of Systems  All other systems reviewed and are negative.    Physical Exam Updated Vital Signs BP (!) 140/97 (BP Location: Right Arm)    Pulse 62   Temp 98.4 F (36.9 C) (Oral)   Resp 18   SpO2 100%   Vital signs normal    Physical Exam Vitals signs and nursing note reviewed.  Constitutional:      General: She is not in acute distress.    Appearance: Normal appearance. She is well-developed. She is not ill-appearing or toxic-appearing.     Comments: Thin female  HENT:     Head: Normocephalic and atraumatic.     Right Ear: External ear normal.     Left Ear: External ear normal.     Nose: Nose normal. No mucosal edema or rhinorrhea.     Mouth/Throat:     Mouth: Mucous membranes are dry.     Dentition: No dental abscesses.     Pharynx: No uvula swelling.     Comments: Patient is missing all of her upper teeth, she appears to have partial tooth that may be tooth #7 or 6 and this is the one that she states is painful., and then either #6 and 5 or #5 and 4 are rotted to the gumline.  When I palpate her soft tissues there is a small localized area of swelling less than the size of half a pea.  There is no obvious swelling of her face or redness. Eyes:     Extraocular Movements: Extraocular movements intact.     Conjunctiva/sclera: Conjunctivae normal.     Pupils: Pupils are equal, round, and reactive to light.  Neck:     Musculoskeletal: Full passive range of motion without pain, normal range of motion and neck supple.  Cardiovascular:     Rate and Rhythm: Normal rate and regular rhythm.     Heart sounds: Normal heart sounds. No murmur. No friction rub. No gallop.   Pulmonary:     Effort: Pulmonary effort is normal. No respiratory distress.     Breath sounds: Normal breath sounds. No wheezing, rhonchi or rales.  Chest:     Chest wall: No tenderness or crepitus.  Abdominal:     General: Bowel sounds are normal. There is no distension.     Palpations: Abdomen is soft.     Tenderness: There is no abdominal tenderness. There is no guarding or rebound.  Musculoskeletal: Normal range of motion.        General: No  tenderness.     Comments: Moves all extremities well.   Skin:  General: Skin is warm and dry.     Capillary Refill: Capillary refill takes less than 2 seconds.     Coloration: Skin is not pale.     Findings: No erythema or rash.  Neurological:     General: No focal deficit present.     Mental Status: She is alert and oriented to person, place, and time.     Cranial Nerves: No cranial nerve deficit.  Psychiatric:        Mood and Affect: Mood normal. Mood is not anxious.        Speech: Speech normal.        Behavior: Behavior normal.        Thought Content: Thought content normal.      ED Treatments / Results  Labs (all labs ordered are listed, but only abnormal results are displayed) Results for orders placed or performed during the hospital encounter of 10/16/18  Lipase, blood  Result Value Ref Range   Lipase 28 11 - 51 U/L  Comprehensive metabolic panel  Result Value Ref Range   Sodium 145 135 - 145 mmol/L   Potassium 3.8 3.5 - 5.1 mmol/L   Chloride 105 98 - 111 mmol/L   CO2 25 22 - 32 mmol/L   Glucose, Bld 121 (H) 70 - 99 mg/dL   BUN 20 6 - 20 mg/dL   Creatinine, Ser 0.76 0.44 - 1.00 mg/dL   Calcium 10.0 8.9 - 10.3 mg/dL   Total Protein 9.5 (H) 6.5 - 8.1 g/dL   Albumin 5.4 (H) 3.5 - 5.0 g/dL   AST 39 15 - 41 U/L   ALT 38 0 - 44 U/L   Alkaline Phosphatase 61 38 - 126 U/L   Total Bilirubin 0.5 0.3 - 1.2 mg/dL   GFR calc non Af Amer >60 >60 mL/min   GFR calc Af Amer >60 >60 mL/min   Anion gap 15 5 - 15  CBC  Result Value Ref Range   WBC 17.5 (H) 4.0 - 10.5 K/uL   RBC 5.34 (H) 3.87 - 5.11 MIL/uL   Hemoglobin 17.2 (H) 12.0 - 15.0 g/dL   HCT 52.7 (H) 36.0 - 46.0 %   MCV 98.7 80.0 - 100.0 fL   MCH 32.2 26.0 - 34.0 pg   MCHC 32.6 30.0 - 36.0 g/dL   RDW 13.8 11.5 - 15.5 %   Platelets 274 150 - 400 K/uL   nRBC 0.0 0.0 - 0.2 %  Urinalysis, Routine w reflex microscopic  Result Value Ref Range   Color, Urine YELLOW YELLOW   APPearance HAZY (A) CLEAR   Specific  Gravity, Urine 1.023 1.005 - 1.030   pH 5.0 5.0 - 8.0   Glucose, UA NEGATIVE NEGATIVE mg/dL   Hgb urine dipstick NEGATIVE NEGATIVE   Bilirubin Urine NEGATIVE NEGATIVE   Ketones, ur NEGATIVE NEGATIVE mg/dL   Protein, ur NEGATIVE NEGATIVE mg/dL   Nitrite NEGATIVE NEGATIVE   Leukocytes,Ua NEGATIVE NEGATIVE  Urine rapid drug screen (hosp performed)  Result Value Ref Range   Opiates NONE DETECTED NONE DETECTED   Cocaine NONE DETECTED NONE DETECTED   Benzodiazepines NONE DETECTED NONE DETECTED   Amphetamines NONE DETECTED NONE DETECTED   Tetrahydrocannabinol POSITIVE (A) NONE DETECTED   Barbiturates NONE DETECTED NONE DETECTED   Laboratory interpretation all normal except leukocytosis, high hb c/w dehydration    EKG None  Radiology No results found.  Procedures Procedures (including critical care time)  Medications Ordered in ED Medications  sodium chloride flush (NS) 0.9 %  injection 3 mL (has no administration in time range)  ondansetron (ZOFRAN) injection 4 mg (4 mg Intravenous Refused 10/16/18 0742)  ondansetron (ZOFRAN) injection 4 mg (4 mg Intravenous Given 10/16/18 0612)  sodium chloride 0.9 % bolus 1,000 mL (0 mLs Intravenous Stopped 10/16/18 0741)  sodium chloride 0.9 % bolus 1,000 mL (0 mLs Intravenous Stopped 10/16/18 0742)  loperamide (IMODIUM) capsule 4 mg (4 mg Oral Given 10/16/18 0707)     Initial Impression / Assessment and Plan / ED Course  I have reviewed the triage vital signs and the nursing notes.  Pertinent labs & imaging results that were available during my care of the patient were reviewed by me and considered in my medical decision making (see chart for details).     Pt was given IV fluids and IV nausea meds and oral imodium.   Patient's prior charts were reviewed.  She was seen at [facility] on February 19 and had a very positive UDS with methamphetamines and cocaine.  Patient states she only smokes marijuana daily.  Also in the charts  are frequent phone calls to her orthopedist requesting pain medication in February.  Her [place] information was reviewed, she has multiple prescriptions for small amounts of oxycodone that were last filled in the end of February.  I almost think patient may be going through withdrawal symptoms right now.  Patient's labs are consistent with dehydration.  Her Zofran was repeated for continued nausea.  Recheck at 8:40 AM (I was tied up with a respiratory patient) patient is not in room, her IV tubing at home is hanging on the pump.  Patient is not in any the bathrooms.  She left [place] in February with her IV intact.  Nursing staff is going to contact the police to have them check her home to see if she left of her IV.  Final Clinical Impressions(s) / ED Diagnoses   Final diagnoses:  Nausea vomiting and diarrhea  Abscessed tooth  Dehydration    Pt left without letting staff know  Rolland Porter, MD, Barbette Or, MD 10/16/18 [phone]

## 2018-10-16 NOTE — ED Notes (Signed)
Pt accidentally pulled her IV out while going to the bathroom. IV catheter was found laying on the floor. Pt refused another IV saying she felt "a lot better".

## 2018-10-16 NOTE — ED Triage Notes (Signed)
Pt states she ate some lettuce yesterday at a family gathering and has been vomiting since that time. Pt denies pain or fever.

## 2018-10-23 ENCOUNTER — Encounter (HOSPITAL_COMMUNITY): Payer: Self-pay

## 2018-10-23 ENCOUNTER — Other Ambulatory Visit: Payer: Self-pay

## 2018-10-23 ENCOUNTER — Emergency Department (HOSPITAL_COMMUNITY)
Admission: EM | Admit: 2018-10-23 | Discharge: 2018-10-23 | Disposition: A | Discharge status: Home / Self Care | Payer: Medicaid Other | Attending: Emergency Medicine | Admitting: Emergency Medicine

## 2018-10-23 DIAGNOSIS — Z79899 Other long term (current) drug therapy: Secondary | ICD-10-CM | POA: Insufficient documentation

## 2018-10-23 DIAGNOSIS — K0889 Other specified disorders of teeth and supporting structures: Secondary | ICD-10-CM

## 2018-10-23 DIAGNOSIS — K029 Dental caries, unspecified: Secondary | ICD-10-CM | POA: Insufficient documentation

## 2018-10-23 DIAGNOSIS — F17210 Nicotine dependence, cigarettes, uncomplicated: Secondary | ICD-10-CM | POA: Insufficient documentation

## 2018-10-23 HISTORY — DX: Other psychoactive substance abuse, uncomplicated: F19.10

## 2018-10-23 MED ORDER — CLINDAMYCIN HCL 150 MG PO CAPS: ORAL_CAPSULE | ORAL | 0 refills | Status: DC

## 2018-10-23 MED ORDER — CLINDAMYCIN HCL 150 MG PO CAPS
450.0000 mg | ORAL_CAPSULE | Freq: Once | ORAL | Status: AC
  Administered 2018-10-23: 450 mg via ORAL
  Filled 2018-10-23: qty 3

## 2018-10-23 MED ORDER — NAPROXEN 250 MG PO TABS: 250.0000 mg | ORAL_TABLET | Freq: Two times a day (BID) | ORAL | 0 refills | Status: DC | PRN

## 2018-10-23 MED ORDER — ACETAMINOPHEN 325 MG PO TABS
650.0000 mg | ORAL_TABLET | Freq: Once | ORAL | Status: AC
  Administered 2018-10-23: 650 mg via ORAL
  Filled 2018-10-23: qty 2

## 2018-10-23 MED ORDER — IBUPROFEN 400 MG PO TABS
400.0000 mg | ORAL_TABLET | Freq: Once | ORAL | Status: AC
  Administered 2018-10-23: 400 mg via ORAL
  Filled 2018-10-23: qty 1

## 2018-10-23 NOTE — Discharge Instructions (Signed)
Take the prescriptions as directed.  Call your regular dentist today to schedule a follow up appointment within the next week.  Return to the Emergency Department immediately sooner if worsening.

## 2018-10-23 NOTE — ED Triage Notes (Signed)
Pt presents to ED with dental pain on top upper right side. Pt states she woke up yesterday with mouth swollen and painful.

## 2018-10-23 NOTE — ED Provider Notes (Signed)
[facility] EMERGENCY DEPARTMENT Provider Note   CSN: [id number] Arrival date & time: 10/23/18  1608    History   Chief Complaint Chief Complaint  Patient presents with   Dental Pain    HPI Olivia Lester is a 43 y.o. female.      Dental Pain    Pt was seen at 1620. Per pt, c/o gradual onset and persistence of constant right upper tooth "pain" for the past 2 to 3 days.  Denies fevers, no intra-oral edema, no rash, no facial swelling, no dysphagia, no neck pain.   The condition is aggravated by nothing. The condition is relieved by nothing. The symptoms have been associated with no other complaints. The patient has no significant history of serious medical conditions.    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   Panic attack    Pneumonia    Polysubstance abuse ([facility])    PONV (postoperative nausea and vomiting)     Patient Active Problem List   Diagnosis Date Noted   Polysubstance (excluding opioids) dependence, daily use ([place]) 09/13/2018   Closed displaced fracture of fifth metatarsal bone of right foot 08/02/2018   Dental abscess 12/25/2017   Gastric ulcer 10/14/2013   Transaminitis 10/14/2013   Tobacco dependence 10/14/2013   Hepatitis C 10/14/2013   Thrombocytopenia ([place]) 10/13/2013   Duodenitis 10/12/2013   Alcoholism ([place]) 10/12/2013   Abdominal pain, epigastric 10/12/2013   ANKLE PAIN 01/01/2008    Past Surgical History:  Procedure Laterality Date   BACK SURGERY     BREAST SURGERY     ESOPHAGOGASTRODUODENOSCOPY (EGD) WITH PROPOFOL N/A 10/14/2013   Procedure: ESOPHAGOGASTRODUODENOSCOPY (EGD) WITH PROPOFOL;  Surgeon: Rogene Houston, MD;  Location: [facility];  Service: Endoscopy;  Laterality: N/A;   ORIF TOE FRACTURE Right 08/08/2018   Procedure: RIGHT FOOT INTERNAL  FIXATION 5TH METATARSAL;  Surgeon: Newt Minion, MD;  Location: [place];  Service: Orthopedics;  Laterality: Right;   TONSILLECTOMY       OB History    Gravida  7   Para  3   Term  3   Preterm      AB  4   Living  3     SAB  2   TAB  2   Ectopic      Multiple      Live Births  3            Home Medications    Prior to Admission medications   Medication Sig Start Date End Date Taking? Authorizing Provider  clindamycin (CLEOCIN) 150 MG capsule 3 tabs PO TID x 10 days 10/23/18   Francine Graven, DO  naproxen (NAPROSYN) 250 MG tablet Take 1 tablet (250 mg total) by mouth 2 (two) times daily as needed for mild pain or moderate pain (take with food). 10/23/18   Francine Graven, DO  oxyCODONE-acetaminophen (PERCOCET/ROXICET) 5-325 MG tablet Take 1 tablet by mouth every 8 (eight) hours as needed. 09/06/18   Rayburn, Neta Mends, PA-C  pantoprazole (PROTONIX) 40 MG tablet Take 40 mg by mouth 2 (two) times daily.     [provider]    Family History Family History  Problem Relation Age of Onset   Cancer Mother 46  ovarian    Hypertension Mother    Stroke Mother    Early death Father    Alcohol abuse Father     Social History Social History   Tobacco Use   Smoking status: Current Every Day Smoker    Packs/day: 0.50    Years: 20.00    Pack years: 10.00    Types: Cigarettes   Smokeless tobacco: Never Used  Substance Use Topics   Alcohol use: Yes    Frequency: Never    Comment: social    Drug use: Yes    Types: Marijuana    Comment: last use July 31, 2018     Allergies   Patient has no known allergies.   Review of Systems Review of Systems ROS: Statement: All systems negative except as marked or noted in the HPI; Constitutional: Negative for fever and chills. ; ; Eyes: Negative for eye pain and discharge. ; ; ENMT: Positive for dental caries, dental hygiene poor and toothache. Negative for ear pain, bleeding gums,  dental injury, facial deformity, facial swelling, hoarseness, nasal congestion, sinus pressure, sore throat, throat swelling and tongue swollen. ; ; Cardiovascular: Negative for chest pain, palpitations, diaphoresis, dyspnea and peripheral edema. ; ; Respiratory: Negative for cough, wheezing and stridor. ; ; Gastrointestinal: Negative for nausea, vomiting, diarrhea and abdominal pain. ; ; Genitourinary: Negative for dysuria, flank pain and hematuria. ; ; Musculoskeletal: Negative for back pain and neck pain. ; ; Skin: Negative for rash and skin lesion. ; ; Neuro: Negative for headache, lightheadedness and neck stiffness.       Physical Exam Updated Vital Signs BP (!) 137/106 (BP Location: Right Arm)    Pulse 82    Temp 98.2 F (36.8 C) (Oral)    Resp 14    Ht 5\' 7"  (1.702 m)    Wt 54.4 kg    LMP 09/30/2018    SpO2 98%    BMI 18.79 kg/m   Physical Exam 1625: Physical examination: Vital signs and O2 SAT: Reviewed; Constitutional: Well developed, Well nourished, Well hydrated, In no acute distress; Head and Face: Normocephalic, Atraumatic; Eyes: EOMI, PERRL, No scleral icterus; ENMT: Mouth and pharynx normal, Poor dentition, Widespread dental decay, Left TM normal, Right TM normal, Mucous membranes moist, Multiple missing teeth. +upper right canine and premolars with extensive dental decay.  No gingival erythema, edema, fluctuance, or drainage.  No intra-oral edema. No submandibular or sublingual edema. No hoarse voice, no drooling, no stridor. No trismus. ; Neck: Supple, Full range of motion, No lymphadenopathy; Cardiovascular: Regular rate and rhythm, No murmur, rub, or gallop; Respiratory: Breath sounds clear & equal bilaterally, No rales, rhonchi, wheezes, Normal respiratory effort/excursion; Chest: Nontender, Movement normal; Extremities: Pulses normal, No tenderness, No edema; Neuro: AA&Ox3, Major CN grossly intact.  No gross focal motor or sensory deficits in extremities. Climbs on and off stretcher  easily by herself. Gait steady..; Skin: Color normal, No rash, No petechiae, Warm, Dry    ED Treatments / Results  Labs (all labs ordered are listed, but only abnormal results are displayed)   EKG None  Radiology   Procedures Procedures (including critical care time)  Medications Ordered in ED Medications  clindamycin (CLEOCIN) capsule 450 mg (has no administration in time range)  acetaminophen (TYLENOL) tablet 650 mg (has no administration in time range)  ibuprofen (ADVIL,MOTRIN) tablet 400 mg (has no administration in time range)     Initial Impression / Assessment and Plan / ED Course  I have reviewed the triage  vital signs and the nursing notes.  Pertinent labs & imaging results that were available during my care of the patient were reviewed by me and considered in my medical decision making (see chart for details).     MDM Reviewed: previous chart, nursing note and vitals Reviewed previous: labs    1630:  Pt encouraged to f/u with dentist or oral surgeon for her dental needs for good continuity of care and definitive treatment.  Pt verb understanding.     Final Clinical Impressions(s) / ED Diagnoses        Francine Graven, DO 10/27/18 1239

## 2018-11-04 ENCOUNTER — Encounter (HOSPITAL_COMMUNITY): Payer: Self-pay | Admitting: Emergency Medicine

## 2018-11-04 ENCOUNTER — Emergency Department (HOSPITAL_COMMUNITY)
Admission: EM | Admit: 2018-11-04 | Discharge: 2018-11-04 | Disposition: A | Discharge status: Home / Self Care | Payer: Self-pay | Attending: Emergency Medicine | Admitting: Emergency Medicine

## 2018-11-04 ENCOUNTER — Other Ambulatory Visit: Payer: Self-pay

## 2018-11-04 DIAGNOSIS — Z79899 Other long term (current) drug therapy: Secondary | ICD-10-CM | POA: Insufficient documentation

## 2018-11-04 DIAGNOSIS — K2900 Acute gastritis without bleeding: Secondary | ICD-10-CM | POA: Insufficient documentation

## 2018-11-04 DIAGNOSIS — F17210 Nicotine dependence, cigarettes, uncomplicated: Secondary | ICD-10-CM | POA: Insufficient documentation

## 2018-11-04 LAB — COMPREHENSIVE METABOLIC PANEL
ALT: 38 U/L (ref 0–44)
AST: 49 U/L — ABNORMAL HIGH (ref 15–41)
Albumin: 4.9 g/dL (ref 3.5–5.0)
Alkaline Phosphatase: 48 U/L (ref 38–126)
Anion gap: 11 (ref 5–15)
BUN: 16 mg/dL (ref 6–20)
CO2: 22 mmol/L (ref 22–32)
Calcium: 8.1 mg/dL — ABNORMAL LOW (ref 8.9–10.3)
Chloride: 111 mmol/L (ref 98–111)
Creatinine, Ser: 0.71 mg/dL (ref 0.44–1.00)
GFR calc Af Amer: >60 mL/min (ref >60)
GFR calc non Af Amer: >60 mL/min (ref >60)
Glucose, Bld: 108 mg/dL — ABNORMAL HIGH (ref 70–99)
Potassium: 3.6 mmol/L (ref 3.5–5.1)
Sodium: 144 mmol/L (ref 135–145)
Total Bilirubin: 0.5 mg/dL (ref 0.3–1.2)
Total Protein: 8.1 g/dL (ref 6.5–8.1)

## 2018-11-04 LAB — CBC WITH DIFFERENTIAL/PLATELET
Abs Immature Granulocytes: 0.04 10*3/uL (ref 0.00–0.07)
Basophils Absolute: 0 10*3/uL (ref 0.0–0.1)
Basophils Relative: 0 %
Eosinophils Absolute: 0 10*3/uL (ref 0.0–0.5)
Eosinophils Relative: 0 %
HCT: 45.3 % (ref 36.0–46.0)
Hemoglobin: 15 g/dL (ref 12.0–15.0)
Immature Granulocytes: 0 %
Lymphocytes Relative: 3 %
Lymphs Abs: 0.6 10*3/uL — ABNORMAL LOW (ref 0.7–4.0)
MCH: 32.8 pg (ref 26.0–34.0)
MCHC: 33.1 g/dL (ref 30.0–36.0)
MCV: 99.1 fL (ref 80.0–100.0)
Monocytes Absolute: 0.6 10*3/uL (ref 0.1–1.0)
Monocytes Relative: 4 %
Neutro Abs: 15.9 10*3/uL — ABNORMAL HIGH (ref 1.7–7.7)
Neutrophils Relative %: 93 %
Platelets: 256 10*3/uL (ref 150–400)
RBC: 4.57 MIL/uL (ref 3.87–5.11)
RDW: 13.4 % (ref 11.5–15.5)
WBC: 17.2 10*3/uL — ABNORMAL HIGH (ref 4.0–10.5)
nRBC: 0 % (ref 0.0–0.2)

## 2018-11-04 LAB — ETHANOL: Alcohol, Ethyl (B): 226 mg/dL — ABNORMAL HIGH (ref <10)

## 2018-11-04 LAB — LIPASE, BLOOD: Lipase: 49 U/L (ref 11–51)

## 2018-11-04 MED ORDER — SODIUM CHLORIDE 0.9 % IV BOLUS
1000.0000 mL | Freq: Once | INTRAVENOUS | Status: AC
  Administered 2018-11-04: 1000 mL via INTRAVENOUS

## 2018-11-04 MED ORDER — LORAZEPAM 2 MG/ML IJ SOLN
0.5000 mg | Freq: Once | INTRAMUSCULAR | Status: AC
  Administered 2018-11-04: 0.5 mg via INTRAVENOUS
  Filled 2018-11-04: qty 1

## 2018-11-04 MED ORDER — ONDANSETRON HCL 4 MG/2ML IJ SOLN
4.0000 mg | Freq: Once | INTRAMUSCULAR | Status: AC
  Administered 2018-11-04: 4 mg via INTRAVENOUS
  Filled 2018-11-04: qty 2

## 2018-11-04 MED ORDER — PANTOPRAZOLE SODIUM 40 MG IV SOLR
40.0000 mg | Freq: Once | INTRAVENOUS | Status: AC
  Administered 2018-11-04: 40 mg via INTRAVENOUS
  Filled 2018-11-04: qty 40

## 2018-11-04 MED ORDER — ONDANSETRON HCL 8 MG PO TABS: 8.0000 mg | ORAL_TABLET | ORAL | 0 refills | Status: DC | PRN

## 2018-11-04 NOTE — Discharge Instructions (Addendum)
Clear liquids for next 12 hours.  Medication for nausea.  Avoid alcohol.

## 2018-11-04 NOTE — ED Provider Notes (Signed)
[facility] EMERGENCY DEPARTMENT Provider Note   CSN: [id number] Arrival date & time: 11/04/18  1841    History   Chief Complaint Chief Complaint  Patient presents with  . Alcohol Intoxication    HPI Olivia Lester is a 43 y.o. female.     Patient admits to drinking 2 fifths of vodka earlier today.  She is now vomiting excessively.  She is ambulatory without neurological deficits.  No other somatic complaints.  Past medical history includes ulcer disease, hepatitis C, polysubstance abuse, several others.  Severity of symptoms is moderate to severe.     Past Medical History:  Diagnosis Date  . Back injury   . Gastric ulcer   . Gastric ulcer 10/14/2013  . Hepatitis C 10/14/2013  . History of kidney stones   . 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 Panic attack   . Pneumonia   . Polysubstance abuse ([place])   . PONV (postoperative nausea and vomiting)     Patient Active Problem List   Diagnosis Date Noted  . Polysubstance (excluding opioids) dependence, daily use ([place]) 09/13/2018  . Closed displaced fracture of fifth metatarsal bone of right foot 08/02/2018  . Dental abscess 12/25/2017  . Gastric ulcer 10/14/2013  . Transaminitis 10/14/2013  . Tobacco dependence 10/14/2013  . Hepatitis C 10/14/2013  . Thrombocytopenia ([place]) 10/13/2013  . Duodenitis 10/12/2013  . Alcoholism ([place]) 10/12/2013  . Abdominal pain, epigastric 10/12/2013  . ANKLE PAIN 01/01/2008    Past Surgical History:  Procedure Laterality Date  . BACK SURGERY    . BREAST SURGERY    . ESOPHAGOGASTRODUODENOSCOPY (EGD) WITH PROPOFOL N/A 10/14/2013   Procedure: ESOPHAGOGASTRODUODENOSCOPY (EGD) WITH PROPOFOL;  Surgeon: Rogene Houston, MD;  Location: [facility];  Service: Endoscopy;  Laterality: N/A;  . ORIF TOE FRACTURE Right 08/08/2018   Procedure: RIGHT FOOT INTERNAL FIXATION 5TH METATARSAL;  Surgeon: Newt Minion, MD;  Location: [place];  Service: Orthopedics;  Laterality: Right;  .  TONSILLECTOMY       OB History    Gravida  7   Para  3   Term  3   Preterm      AB  4   Living  3     SAB  2   TAB  2   Ectopic      Multiple      Live Births  3            Home Medications    Prior to Admission medications   Medication Sig Start Date End Date Taking? Authorizing Provider  clindamycin (CLEOCIN) 150 MG capsule 3 tabs PO TID x 10 days 10/23/18   Francine Graven, DO  naproxen (NAPROSYN) 250 MG tablet Take 1 tablet (250 mg total) by mouth 2 (two) times daily as needed for mild pain or moderate pain (take with food). 10/23/18   Francine Graven, DO  ondansetron (ZOFRAN) 8 MG tablet Take 1 tablet (8 mg total) by mouth every 4 (four) hours as needed. 11/04/18   Nat Christen, MD  oxyCODONE-acetaminophen (PERCOCET/ROXICET) 5-325 MG tablet Take 1 tablet by mouth every 8 (eight) hours as needed. 09/06/18   Rayburn, Neta Mends, PA-C  pantoprazole (PROTONIX) 40 MG tablet Take 40 mg by mouth 2 (two) times daily.     [provider]    Family History Family History  Problem Relation Age of Onset  . Cancer Mother 53       ovarian   .  Hypertension Mother   . Stroke Mother   . Early death Father   . Alcohol abuse Father     Social History Social History   Tobacco Use  . Smoking status: Current Every Day Smoker    Packs/day: 0.50    Years: 20.00    Pack years: 10.00    Types: Cigarettes  . Smokeless tobacco: Never Used  Substance Use Topics  . Alcohol use: Yes    Frequency: Never    Comment: social   . Drug use: Yes    Types: Marijuana    Comment: last use July 31, 2018     Allergies   Patient has no known allergies.   Review of Systems Review of Systems  All other systems reviewed and are negative.    Physical Exam Updated Vital Signs BP (!) 132/93 (BP Location: Right Arm)   Pulse 92   Temp 97.9 F (36.6 C) (Oral)   Resp 15   Ht 5\' 7"  (1.702 m)   Wt 54.4 kg   LMP 11/04/2018 (Exact Date)   SpO2 99%   BMI  18.78 kg/m   Physical Exam Vitals signs and nursing note reviewed.  Constitutional:      Appearance: She is well-developed.     Comments: Ambulatory, vomiting  HENT:     Head: Normocephalic and atraumatic.  Eyes:     Conjunctiva/sclera: Conjunctivae normal.  Neck:     Musculoskeletal: Neck supple.  Cardiovascular:     Rate and Rhythm: Normal rate and regular rhythm.  Pulmonary:     Effort: Pulmonary effort is normal.     Breath sounds: Normal breath sounds.  Abdominal:     General: Bowel sounds are normal.     Palpations: Abdomen is soft.     Comments: Tender epigastrium  Musculoskeletal: Normal range of motion.  Skin:    General: Skin is warm and dry.  Neurological:     Mental Status: She is alert and oriented to person, place, and time.  Psychiatric:        Behavior: Behavior normal.      ED Treatments / Results  Labs (all labs ordered are listed, but only abnormal results are displayed) Labs Reviewed  CBC WITH DIFFERENTIAL/PLATELET - Abnormal; Notable for the following components:      Result Value   WBC 17.2 (*)    Neutro Abs 15.9 (*)    Lymphs Abs 0.6 (*)    All other components within normal limits  COMPREHENSIVE METABOLIC PANEL - Abnormal; Notable for the following components:   Glucose, Bld 108 (*)    Calcium 8.1 (*)    AST 49 (*)    All other components within normal limits  ETHANOL - Abnormal; Notable for the following components:   Alcohol, Ethyl (B) 226 (*)    All other components within normal limits  LIPASE, BLOOD    EKG None  Radiology No results found.  Procedures Procedures (including critical care time)  Medications Ordered in ED Medications  ondansetron (ZOFRAN) injection 4 mg (4 mg Intravenous Given 11/04/18 1930)  sodium chloride 0.9 % bolus 1,000 mL (0 mLs Intravenous Stopped 11/04/18 2146)  pantoprazole (PROTONIX) injection 40 mg (40 mg Intravenous Given 11/04/18 1928)  sodium chloride 0.9 % bolus 1,000 mL (0 mLs Intravenous  Stopped 11/04/18 2034)  ondansetron (ZOFRAN) injection 4 mg (4 mg Intravenous Given 11/04/18 1955)  LORazepam (ATIVAN) injection 0.5 mg (0.5 mg Intravenous Given 11/04/18 1955)  ondansetron (ZOFRAN) injection 4 mg (4 mg Intravenous  Given 11/04/18 2043)     Initial Impression / Assessment and Plan / ED Course  I have reviewed the triage vital signs and the nursing notes.  Pertinent labs & imaging results that were available during my care of the patient were reviewed by me and considered in my medical decision making (see chart for details).        History and physical consistent with alcoholic gastritis.  EtOH 226.  Liver functions and lipase acceptable.  She feels much better after 2 L of IV fluids, IV Zofran, IV Protonix.  No acute abdomen at discharge.  Final Clinical Impressions(s) / ED Diagnoses   Final diagnoses:  Acute gastritis without hemorrhage, unspecified gastritis type    ED Discharge Orders         Ordered    ondansetron (ZOFRAN) 8 MG tablet  Every 4 hours PRN     11/04/18 2230           Nat Christen, MD 11/04/18 2249

## 2018-11-04 NOTE — ED Triage Notes (Signed)
Pt reports she accidentally drank a half gallon of liquor in the last 24 hours by doing shots- at a friends [employer]  Now sick and vomiting for the last 2 hours

## 2018-11-27 ENCOUNTER — Encounter (HOSPITAL_COMMUNITY): Payer: Self-pay

## 2018-11-27 ENCOUNTER — Other Ambulatory Visit: Payer: Self-pay

## 2018-11-27 ENCOUNTER — Emergency Department (HOSPITAL_COMMUNITY)
Admission: EM | Admit: 2018-11-27 | Discharge: 2018-11-27 | Disposition: A | Discharge status: Home / Self Care | Payer: Self-pay | Attending: Emergency Medicine | Admitting: Emergency Medicine

## 2018-11-27 ENCOUNTER — Emergency Department (HOSPITAL_COMMUNITY): Payer: Self-pay

## 2018-11-27 DIAGNOSIS — S60221A Contusion of right hand, initial encounter: Secondary | ICD-10-CM | POA: Insufficient documentation

## 2018-11-27 DIAGNOSIS — F17210 Nicotine dependence, cigarettes, uncomplicated: Secondary | ICD-10-CM | POA: Insufficient documentation

## 2018-11-27 DIAGNOSIS — Z79899 Other long term (current) drug therapy: Secondary | ICD-10-CM | POA: Insufficient documentation

## 2018-11-27 DIAGNOSIS — Y939 Activity, unspecified: Secondary | ICD-10-CM | POA: Insufficient documentation

## 2018-11-27 DIAGNOSIS — Y929 Unspecified place or not applicable: Secondary | ICD-10-CM | POA: Insufficient documentation

## 2018-11-27 DIAGNOSIS — W51XXXA Accidental striking against or bumped into by another person, initial encounter: Secondary | ICD-10-CM | POA: Insufficient documentation

## 2018-11-27 DIAGNOSIS — Y999 Unspecified external cause status: Secondary | ICD-10-CM | POA: Insufficient documentation

## 2018-11-27 DIAGNOSIS — F17200 Nicotine dependence, unspecified, uncomplicated: Secondary | ICD-10-CM | POA: Insufficient documentation

## 2018-11-27 MED ORDER — IBUPROFEN 400 MG PO TABS
600.0000 mg | ORAL_TABLET | Freq: Once | ORAL | Status: AC
  Administered 2018-11-27: 600 mg via ORAL
  Filled 2018-11-27: qty 2
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
## 2018-11-27 NOTE — ED Notes (Signed)
Pt seen walking out of room and out of ED. MD notified.
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
## 2018-12-18 ENCOUNTER — Ambulatory Visit: Payer: Self-pay | Admitting: Physician Assistant

## 2018-12-22 ENCOUNTER — Telehealth: Payer: Self-pay

## 2018-12-22 NOTE — Telephone Encounter (Signed)
Patient a no show at [facility] on 12/18/18, no exposure to COVID-19.

## 2019-01-02 ENCOUNTER — Emergency Department (HOSPITAL_COMMUNITY)
Admission: EM | Admit: 2019-01-02 | Discharge: 2019-01-02 | Disposition: A | Discharge status: Home / Self Care | Payer: Medicaid Other | Attending: Emergency Medicine | Admitting: Emergency Medicine

## 2019-01-02 ENCOUNTER — Encounter (HOSPITAL_COMMUNITY): Payer: Self-pay | Admitting: Emergency Medicine

## 2019-01-02 ENCOUNTER — Other Ambulatory Visit: Payer: Self-pay

## 2019-01-02 DIAGNOSIS — H5711 Ocular pain, right eye: Secondary | ICD-10-CM | POA: Insufficient documentation

## 2019-01-02 DIAGNOSIS — Z5321 Procedure and treatment not carried out due to patient leaving prior to being seen by health care provider: Secondary | ICD-10-CM | POA: Insufficient documentation

## 2019-01-02 MED ORDER — TETRACAINE HCL 0.5 % OP SOLN: 2.0000 [drp] | Freq: Once | OPHTHALMIC | Status: DC

## 2019-01-02 MED ORDER — FLUORESCEIN SODIUM 1 MG OP STRP: 1.0000 | ORAL_STRIP | Freq: Once | OPHTHALMIC | Status: DC

## 2019-01-02 NOTE — ED Triage Notes (Signed)
Patient complains of right eye x 1 week. She states, " I assumed it was pink eye, but it feels like there is something in it."

## 2019-01-03 ENCOUNTER — Other Ambulatory Visit: Payer: Self-pay

## 2019-01-03 ENCOUNTER — Emergency Department (HOSPITAL_COMMUNITY)
Admission: EM | Admit: 2019-01-03 | Discharge: 2019-01-03 | Disposition: A | Discharge status: Home / Self Care | Payer: Self-pay | Attending: Emergency Medicine | Admitting: Emergency Medicine

## 2019-01-03 ENCOUNTER — Encounter (HOSPITAL_COMMUNITY): Payer: Self-pay | Admitting: *Deleted

## 2019-01-03 DIAGNOSIS — H5710 Ocular pain, unspecified eye: Secondary | ICD-10-CM | POA: Insufficient documentation

## 2019-01-03 DIAGNOSIS — Z5321 Procedure and treatment not carried out due to patient leaving prior to being seen by health care provider: Secondary | ICD-10-CM | POA: Insufficient documentation

## 2019-01-03 NOTE — ED Triage Notes (Addendum)
Pt with eye drainage and redness to right eye for a week, pt believes there is something in her eye.   here yesterday but had to leave.

## 2019-02-04 ENCOUNTER — Other Ambulatory Visit: Payer: Self-pay | Admitting: Obstetrics and Gynecology

## 2019-03-10 ENCOUNTER — Emergency Department (HOSPITAL_COMMUNITY): Payer: Self-pay

## 2019-03-10 ENCOUNTER — Other Ambulatory Visit: Payer: Self-pay

## 2019-03-10 ENCOUNTER — Inpatient Hospital Stay (HOSPITAL_COMMUNITY)
Admission: EM | Admit: 2019-03-10 | Discharge: 2019-03-12 | DRG: 439 | Discharge status: Against Medical Advice | Payer: Self-pay | Attending: Internal Medicine | Admitting: Internal Medicine

## 2019-03-10 ENCOUNTER — Encounter (HOSPITAL_COMMUNITY): Payer: Self-pay | Admitting: Emergency Medicine

## 2019-03-10 DIAGNOSIS — R1010 Upper abdominal pain, unspecified: Secondary | ICD-10-CM

## 2019-03-10 DIAGNOSIS — K7010 Alcoholic hepatitis without ascites: Secondary | ICD-10-CM | POA: Diagnosis present

## 2019-03-10 DIAGNOSIS — F1210 Cannabis abuse, uncomplicated: Secondary | ICD-10-CM

## 2019-03-10 DIAGNOSIS — K920 Hematemesis: Secondary | ICD-10-CM | POA: Diagnosis present

## 2019-03-10 DIAGNOSIS — F17200 Nicotine dependence, unspecified, uncomplicated: Secondary | ICD-10-CM | POA: Diagnosis present

## 2019-03-10 DIAGNOSIS — F17210 Nicotine dependence, cigarettes, uncomplicated: Secondary | ICD-10-CM | POA: Diagnosis present

## 2019-03-10 DIAGNOSIS — K760 Fatty (change of) liver, not elsewhere classified: Secondary | ICD-10-CM | POA: Diagnosis present

## 2019-03-10 DIAGNOSIS — F1110 Opioid abuse, uncomplicated: Secondary | ICD-10-CM | POA: Diagnosis present

## 2019-03-10 DIAGNOSIS — F410 Panic disorder [episodic paroxysmal anxiety] without agoraphobia: Secondary | ICD-10-CM | POA: Diagnosis present

## 2019-03-10 DIAGNOSIS — Z811 Family history of alcohol abuse and dependence: Secondary | ICD-10-CM

## 2019-03-10 DIAGNOSIS — R109 Unspecified abdominal pain: Secondary | ICD-10-CM

## 2019-03-10 DIAGNOSIS — G8929 Other chronic pain: Secondary | ICD-10-CM | POA: Diagnosis present

## 2019-03-10 DIAGNOSIS — F1010 Alcohol abuse, uncomplicated: Secondary | ICD-10-CM

## 2019-03-10 DIAGNOSIS — R7401 Elevation of levels of liver transaminase levels: Secondary | ICD-10-CM

## 2019-03-10 DIAGNOSIS — K838 Other specified diseases of biliary tract: Secondary | ICD-10-CM | POA: Diagnosis present

## 2019-03-10 DIAGNOSIS — Z79899 Other long term (current) drug therapy: Secondary | ICD-10-CM

## 2019-03-10 DIAGNOSIS — Z20828 Contact with and (suspected) exposure to other viral communicable diseases: Secondary | ICD-10-CM | POA: Diagnosis present

## 2019-03-10 DIAGNOSIS — K852 Alcohol induced acute pancreatitis without necrosis or infection: Principal | ICD-10-CM

## 2019-03-10 DIAGNOSIS — D696 Thrombocytopenia, unspecified: Secondary | ICD-10-CM | POA: Diagnosis present

## 2019-03-10 DIAGNOSIS — K859 Acute pancreatitis without necrosis or infection, unspecified: Secondary | ICD-10-CM | POA: Diagnosis present

## 2019-03-10 DIAGNOSIS — E876 Hypokalemia: Secondary | ICD-10-CM | POA: Diagnosis present

## 2019-03-10 LAB — COMPREHENSIVE METABOLIC PANEL
ALT: 85 U/L — ABNORMAL HIGH (ref 0–44)
AST: 149 U/L — ABNORMAL HIGH (ref 15–41)
Albumin: 5.1 g/dL — ABNORMAL HIGH (ref 3.5–5.0)
Alkaline Phosphatase: 50 U/L (ref 38–126)
Anion gap: 23 — ABNORMAL HIGH (ref 5–15)
BUN: 11 mg/dL (ref 6–20)
CO2: 22 mmol/L (ref 22–32)
Calcium: 8.9 mg/dL (ref 8.9–10.3)
Chloride: 97 mmol/L — ABNORMAL LOW (ref 98–111)
Creatinine, Ser: 0.63 mg/dL (ref 0.44–1.00)
GFR calc Af Amer: >60 mL/min (ref >60)
GFR calc non Af Amer: >60 mL/min (ref >60)
Glucose, Bld: 88 mg/dL (ref 70–99)
Potassium: 3.1 mmol/L — ABNORMAL LOW (ref 3.5–5.1)
Sodium: 142 mmol/L (ref 135–145)
Total Bilirubin: 0.7 mg/dL (ref 0.3–1.2)
Total Protein: 8.1 g/dL (ref 6.5–8.1)

## 2019-03-10 LAB — URINALYSIS, ROUTINE W REFLEX MICROSCOPIC
Bilirubin Urine: NEGATIVE
Glucose, UA: NEGATIVE mg/dL
Hgb urine dipstick: NEGATIVE
Ketones, ur: 20 mg/dL — AB
Leukocytes,Ua: NEGATIVE
Nitrite: NEGATIVE
Protein, ur: NEGATIVE mg/dL
Specific Gravity, Urine: 1.012 (ref 1.005–1.030)
pH: 6 (ref 5.0–8.0)

## 2019-03-10 LAB — CBC WITH DIFFERENTIAL/PLATELET
Abs Immature Granulocytes: 0.06 10*3/uL (ref 0.00–0.07)
Basophils Absolute: 0.1 10*3/uL (ref 0.0–0.1)
Basophils Relative: 1 %
Eosinophils Absolute: 0 10*3/uL (ref 0.0–0.5)
Eosinophils Relative: 0 %
HCT: 44.9 % (ref 36.0–46.0)
Hemoglobin: 14.6 g/dL (ref 12.0–15.0)
Immature Granulocytes: 0 %
Lymphocytes Relative: 14 %
Lymphs Abs: 2.6 10*3/uL (ref 0.7–4.0)
MCH: 31.9 pg (ref 26.0–34.0)
MCHC: 32.5 g/dL (ref 30.0–36.0)
MCV: 98.2 fL (ref 80.0–100.0)
Monocytes Absolute: 0.6 10*3/uL (ref 0.1–1.0)
Monocytes Relative: 3 %
Neutro Abs: 15.3 10*3/uL — ABNORMAL HIGH (ref 1.7–7.7)
Neutrophils Relative %: 82 %
Platelets: 248 10*3/uL (ref 150–400)
RBC: 4.57 MIL/uL (ref 3.87–5.11)
RDW: 13.8 % (ref 11.5–15.5)
WBC: 18.6 10*3/uL — ABNORMAL HIGH (ref 4.0–10.5)
nRBC: 0 % (ref 0.0–0.2)

## 2019-03-10 LAB — PREGNANCY, URINE: Preg Test, Ur: NEGATIVE

## 2019-03-10 LAB — RAPID URINE DRUG SCREEN, HOSP PERFORMED
Amphetamines: NOT DETECTED
Barbiturates: NOT DETECTED
Benzodiazepines: NOT DETECTED
Cocaine: NOT DETECTED
Opiates: POSITIVE — AB
Tetrahydrocannabinol: POSITIVE — AB

## 2019-03-10 LAB — ETHANOL: Alcohol, Ethyl (B): 59 mg/dL — ABNORMAL HIGH (ref <10)

## 2019-03-10 LAB — SARS CORONAVIRUS 2 BY RT PCR (HOSPITAL ORDER, PERFORMED IN ~~LOC~~ HOSPITAL LAB): SARS Coronavirus 2: NEGATIVE

## 2019-03-10 LAB — LIPASE, BLOOD: Lipase: 379 U/L — ABNORMAL HIGH (ref 11–51)

## 2019-03-10 MED ORDER — ONDANSETRON HCL 4 MG/2ML IJ SOLN
4.0000 mg | Freq: Once | INTRAMUSCULAR | Status: AC
  Administered 2019-03-10: 10:00:00 4 mg via INTRAVENOUS
  Filled 2019-03-10: qty 2

## 2019-03-10 MED ORDER — ACETAMINOPHEN 650 MG RE SUPP: 650.0000 mg | Freq: Four times a day (QID) | RECTAL | Status: DC | PRN

## 2019-03-10 MED ORDER — ACETAMINOPHEN 325 MG PO TABS: 650.0000 mg | ORAL_TABLET | Freq: Four times a day (QID) | ORAL | Status: DC | PRN

## 2019-03-10 MED ORDER — PANTOPRAZOLE SODIUM 40 MG IV SOLR
40.0000 mg | Freq: Once | INTRAVENOUS | Status: AC
  Administered 2019-03-10: 10:00:00 40 mg via INTRAVENOUS
  Filled 2019-03-10: qty 40

## 2019-03-10 MED ORDER — ONDANSETRON HCL 4 MG PO TABS: 4.0000 mg | ORAL_TABLET | Freq: Four times a day (QID) | ORAL | Status: DC | PRN

## 2019-03-10 MED ORDER — ADULT MULTIVITAMIN W/MINERALS CH
1.0000 | ORAL_TABLET | Freq: Every day | ORAL | Status: DC
  Administered 2019-03-10 – 2019-03-12 (×2): 1 via ORAL
  Filled 2019-03-10 (×2): qty 1

## 2019-03-10 MED ORDER — HYDROMORPHONE HCL 1 MG/ML IJ SOLN
1.0000 mg | Freq: Once | INTRAMUSCULAR | Status: AC
  Administered 2019-03-10: 1 mg via INTRAVENOUS
  Filled 2019-03-10: qty 1

## 2019-03-10 MED ORDER — MORPHINE SULFATE (PF) 4 MG/ML IV SOLN
4.0000 mg | INTRAVENOUS | Status: DC | PRN
  Administered 2019-03-10 – 2019-03-12 (×10): 4 mg via INTRAVENOUS
  Filled 2019-03-10 (×10): qty 1

## 2019-03-10 MED ORDER — LORAZEPAM 1 MG PO TABS: 1.0000 mg | ORAL_TABLET | Freq: Four times a day (QID) | ORAL | Status: DC | PRN

## 2019-03-10 MED ORDER — THIAMINE HCL 100 MG/ML IJ SOLN
100.0000 mg | Freq: Every day | INTRAMUSCULAR | Status: DC
  Administered 2019-03-11: 11:00:00 100 mg via INTRAVENOUS
  Filled 2019-03-10: qty 2

## 2019-03-10 MED ORDER — KCL IN DEXTROSE-NACL 40-5-0.9 MEQ/L-%-% IV SOLN
INTRAVENOUS | Status: DC
  Administered 2019-03-10 – 2019-03-11 (×2): via INTRAVENOUS

## 2019-03-10 MED ORDER — ORAL CARE MOUTH RINSE: 15.0000 mL | Freq: Two times a day (BID) | OROMUCOSAL | Status: DC

## 2019-03-10 MED ORDER — SODIUM CHLORIDE 0.9 % IV BOLUS
1000.0000 mL | Freq: Once | INTRAVENOUS | Status: AC
  Administered 2019-03-10: 10:00:00 1000 mL via INTRAVENOUS

## 2019-03-10 MED ORDER — FOLIC ACID 1 MG PO TABS
1.0000 mg | ORAL_TABLET | Freq: Every day | ORAL | Status: DC
  Administered 2019-03-10 – 2019-03-12 (×2): 1 mg via ORAL
  Filled 2019-03-10 (×2): qty 1

## 2019-03-10 MED ORDER — PROMETHAZINE HCL 25 MG/ML IJ SOLN
12.5000 mg | Freq: Once | INTRAMUSCULAR | Status: AC
  Administered 2019-03-10: 12:00:00 12.5 mg via INTRAVENOUS
  Filled 2019-03-10: qty 1

## 2019-03-10 MED ORDER — LORAZEPAM 2 MG/ML IJ SOLN
1.0000 mg | Freq: Four times a day (QID) | INTRAMUSCULAR | Status: DC | PRN
  Administered 2019-03-10 – 2019-03-12 (×7): 1 mg via INTRAVENOUS
  Filled 2019-03-10 (×7): qty 1

## 2019-03-10 MED ORDER — VITAMIN B-1 100 MG PO TABS
100.0000 mg | ORAL_TABLET | Freq: Every day | ORAL | Status: DC
  Administered 2019-03-10 – 2019-03-12 (×2): 100 mg via ORAL
  Filled 2019-03-10 (×2): qty 1

## 2019-03-10 MED ORDER — IOHEXOL 300 MG/ML  SOLN
30.0000 mL | Freq: Once | INTRAMUSCULAR | Status: AC | PRN
  Administered 2019-03-10: 30 mL via ORAL

## 2019-03-10 MED ORDER — HYDROMORPHONE HCL 1 MG/ML IJ SOLN
1.0000 mg | Freq: Once | INTRAMUSCULAR | Status: AC
  Administered 2019-03-10: 11:00:00 1 mg via INTRAVENOUS
  Filled 2019-03-10: qty 1

## 2019-03-10 MED ORDER — PANTOPRAZOLE SODIUM 40 MG IV SOLR
40.0000 mg | INTRAVENOUS | Status: DC
  Administered 2019-03-11: 08:00:00 40 mg via INTRAVENOUS
  Filled 2019-03-10: qty 40

## 2019-03-10 MED ORDER — PIPERACILLIN-TAZOBACTAM 3.375 G IVPB
3.3750 g | Freq: Once | INTRAVENOUS | Status: AC
  Administered 2019-03-10: 15:00:00 3.375 g via INTRAVENOUS
  Filled 2019-03-10: qty 50

## 2019-03-10 MED ORDER — POTASSIUM CHLORIDE 10 MEQ/100ML IV SOLN
10.0000 meq | Freq: Once | INTRAVENOUS | Status: DC
  Filled 2019-03-10: qty 100

## 2019-03-10 MED ORDER — CHLORHEXIDINE GLUCONATE 0.12 % MT SOLN
15.0000 mL | Freq: Two times a day (BID) | OROMUCOSAL | Status: DC
  Administered 2019-03-10 – 2019-03-12 (×3): 15 mL via OROMUCOSAL
  Filled 2019-03-10 (×3): qty 15

## 2019-03-10 MED ORDER — MORPHINE SULFATE (PF) 4 MG/ML IV SOLN
4.0000 mg | Freq: Once | INTRAVENOUS | Status: AC
  Administered 2019-03-10: 4 mg via INTRAVENOUS
  Filled 2019-03-10: qty 1

## 2019-03-10 MED ORDER — ONDANSETRON HCL 4 MG/2ML IJ SOLN
4.0000 mg | Freq: Four times a day (QID) | INTRAMUSCULAR | Status: DC | PRN
  Administered 2019-03-10 – 2019-03-11 (×2): 4 mg via INTRAVENOUS
  Filled 2019-03-10 (×2): qty 2

## 2019-03-10 MED ORDER — SODIUM CHLORIDE 0.9 % IV BOLUS
1000.0000 mL | Freq: Once | INTRAVENOUS | Status: AC
  Administered 2019-03-10: 1000 mL via INTRAVENOUS

## 2019-03-10 MED ORDER — IOHEXOL 300 MG/ML  SOLN
100.0000 mL | Freq: Once | INTRAMUSCULAR | Status: AC | PRN
  Administered 2019-03-10: 100 mL via INTRAVENOUS

## 2019-03-10 NOTE — ED Notes (Signed)
ED Provider at bedside. 

## 2019-03-10 NOTE — ED Provider Notes (Signed)
[facility] EMERGENCY DEPARTMENT Provider Note   CSN: [id number] Arrival date & time: 03/10/19  4431    History   Chief Complaint Chief Complaint  Patient presents with  . Abdominal Pain    HPI Olivia Lester is a 43 y.o. female.     The history is provided by the patient.  Abdominal Pain Pain location:  Epigastric Pain quality: gnawing and sharp   Pain radiates to:  Back Pain severity:  Severe Onset quality:  Gradual Duration:  10 hours Timing:  Constant Progression:  Worsening Chronicity:  New Context: alcohol use   Relieved by:  None tried Worsened by:  Nothing Ineffective treatments:  None tried Associated symptoms: hematemesis, nausea and vomiting   Associated symptoms: no chest pain, no chills, no cough, no diarrhea, no dysuria, no fever, no shortness of breath and no sore throat   Associated symptoms comment:  She has reported several episodes of small red specks in vomitus. Risk factors: alcohol abuse     Past Medical History:  Diagnosis Date  . Back injury   . Gastric ulcer   . Gastric ulcer 10/14/2013  . Hepatitis C 10/14/2013  . History of kidney stones   . 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 Panic attack   . Pneumonia   . Polysubstance abuse ([place])   . PONV (postoperative nausea and vomiting)     Patient Active Problem List   Diagnosis Date Noted  . Polysubstance (excluding opioids) dependence, daily use ([place]) 09/13/2018  . Closed displaced fracture of fifth metatarsal bone of right foot 08/02/2018  . Dental abscess 12/25/2017  . Gastric ulcer 10/14/2013  . Transaminitis 10/14/2013  . Tobacco dependence 10/14/2013  . Hepatitis C 10/14/2013  . Thrombocytopenia ([place]) 10/13/2013  . Duodenitis 10/12/2013  . Alcoholism ([place]) 10/12/2013  . Abdominal pain, epigastric 10/12/2013  . ANKLE PAIN 01/01/2008    Past Surgical History:  Procedure Laterality Date  . BACK SURGERY    . BREAST SURGERY    . ESOPHAGOGASTRODUODENOSCOPY  (EGD) WITH PROPOFOL N/A 10/14/2013   Procedure: ESOPHAGOGASTRODUODENOSCOPY (EGD) WITH PROPOFOL;  Surgeon: Rogene Houston, MD;  Location: [facility];  Service: Endoscopy;  Laterality: N/A;  . ORIF TOE FRACTURE Right 08/08/2018   Procedure: RIGHT FOOT INTERNAL FIXATION 5TH METATARSAL;  Surgeon: Newt Minion, MD;  Location: [place];  Service: Orthopedics;  Laterality: Right;  . TONSILLECTOMY       OB History    Gravida  7   Para  3   Term  3   Preterm      AB  4   Living  3     SAB  2   TAB  2   Ectopic      Multiple      Live Births  3            Home Medications    Prior to Admission medications   Medication Sig Start Date End Date Taking? Authorizing Provider  ibuprofen (ADVIL) 800 MG tablet Take 800 mg by mouth every 8 (eight) hours as needed for moderate pain.   Yes [provider]  clindamycin (CLEOCIN) 150 MG capsule 3 tabs PO TID x 10 days Patient not taking: Reported on 03/10/2019 10/23/18   Francine Graven, DO  naproxen (NAPROSYN) 250 MG tablet Take 1 tablet (250 mg total) by mouth 2 (two) times daily as needed for mild pain or moderate pain (take with food). Patient not taking: Reported on  03/10/2019 10/23/18   Francine Graven, DO  ondansetron (ZOFRAN) 8 MG tablet Take 1 tablet (8 mg total) by mouth every 4 (four) hours as needed. Patient not taking: Reported on 03/10/2019 11/04/18   Nat Christen, MD  oxyCODONE-acetaminophen (PERCOCET/ROXICET) 5-325 MG tablet Take 1 tablet by mouth every 8 (eight) hours as needed. Patient not taking: Reported on 03/10/2019 09/06/18   Rayburn, Neta Mends, PA-C  pantoprazole (PROTONIX) 40 MG tablet Take 40 mg by mouth 2 (two) times daily.     [provider]    Family History Family History  Problem Relation Age of Onset  . Cancer Mother 22       ovarian   . Hypertension Mother   . Stroke Mother   . Early death Father   . Alcohol abuse Father     Social History Social History   Tobacco Use  .  Smoking status: Current Every Day Smoker    Packs/day: 0.50    Years: 20.00    Pack years: 10.00    Types: Cigarettes  . Smokeless tobacco: Never Used  Substance Use Topics  . Alcohol use: Yes    Frequency: Never    Comment: several times weekly  . Drug use: Yes    Types: Marijuana     Allergies   Patient has no known allergies.   Review of Systems Review of Systems  Constitutional: Negative for chills and fever.  HENT: Negative for congestion and sore throat.   Eyes: Negative.   Respiratory: Negative for cough, chest tightness and shortness of breath.   Cardiovascular: Negative for chest pain.  Gastrointestinal: Positive for abdominal pain, hematemesis, nausea and vomiting. Negative for diarrhea.  Genitourinary: Negative.  Negative for dysuria.  Musculoskeletal: Negative for arthralgias, joint swelling and neck pain.  Skin: Negative.  Negative for rash and wound.  Neurological: Negative for dizziness, weakness, light-headedness, numbness and headaches.  Psychiatric/Behavioral: Negative.      Physical Exam Updated Vital Signs BP (!) 161/97   Pulse 72   Temp 97.8 F (36.6 C) (Oral)   Resp 18   Ht 5\' 6"  (1.676 m)   Wt 52.2 kg   LMP 02/17/2019 Comment: negf u preg  SpO2 100%   BMI 18.56 kg/m   Physical Exam Vitals signs and nursing note reviewed.  Constitutional:      General: She is in acute distress.     Appearance: She is well-developed.     Comments: Actively dry heaving  HENT:     Head: Normocephalic and atraumatic.     Mouth/Throat:     Mouth: Mucous membranes are dry.  Eyes:     Conjunctiva/sclera: Conjunctivae normal.  Neck:     Musculoskeletal: Normal range of motion.  Cardiovascular:     Rate and Rhythm: Normal rate and regular rhythm.     Heart sounds: Normal heart sounds.  Pulmonary:     Effort: Pulmonary effort is normal.     Breath sounds: Normal breath sounds. No wheezing.  Abdominal:     General: Bowel sounds are normal.      Palpations: Abdomen is soft.     Tenderness: There is abdominal tenderness in the right upper quadrant, epigastric area and left upper quadrant. There is guarding and rebound.  Musculoskeletal: Normal range of motion.  Skin:    General: Skin is warm and dry.  Neurological:     Mental Status: She is alert.      ED Treatments / Results  Labs (all labs ordered are  listed, but only abnormal results are displayed) Labs Reviewed  CBC WITH DIFFERENTIAL/PLATELET - Abnormal; Notable for the following components:      Result Value   WBC 18.6 (*)    Neutro Abs 15.3 (*)    All other components within normal limits  COMPREHENSIVE METABOLIC PANEL - Abnormal; Notable for the following components:   Potassium 3.1 (*)    Chloride 97 (*)    Albumin 5.1 (*)    AST 149 (*)    ALT 85 (*)    Anion gap 23 (*)    All other components within normal limits  LIPASE, BLOOD - Abnormal; Notable for the following components:   Lipase 379 (*)    All other components within normal limits  URINALYSIS, ROUTINE W REFLEX MICROSCOPIC - Abnormal; Notable for the following components:   Ketones, ur 20 (*)    All other components within normal limits  ETHANOL - Abnormal; Notable for the following components:   Alcohol, Ethyl (B) 59 (*)    All other components within normal limits  RAPID URINE DRUG SCREEN, HOSP PERFORMED - Abnormal; Notable for the following components:   Opiates POSITIVE (*)    Tetrahydrocannabinol POSITIVE (*)    All other components within normal limits  PREGNANCY, URINE    EKG None  Radiology Ct Abdomen Pelvis W Contrast  Result Date: 03/10/2019 CLINICAL DATA:  Abdominal pain with vomiting. History of ulcers. History of hepatitis-C. EXAM: CT ABDOMEN AND PELVIS WITH CONTRAST TECHNIQUE: Multidetector CT imaging of the abdomen and pelvis was performed using the standard protocol following bolus administration of intravenous contrast. CONTRAST:  158mL OMNIPAQUE IOHEXOL 300 MG/ML SOLN, 74mL  OMNIPAQUE IOHEXOL 300 MG/ML SOLN COMPARISON:  CT 10/12/2013, 01/22/2014 FINDINGS: Lower chest: Lung bases are clear. Hepatobiliary: Low-attenuation liver suggests hepatic steatosis. Gallbladder normal. No biliary duct dilatation. Common bile duct is prominent but within normal limits at 5 mm through the pancreatic head. No obstructing lesion identified. Pancreas: There is inflammation involving the pancreatic head. Small amount of peripancreatic fluid. No pancreatic duct dilatation. The body and tail the pancreas are normal. No organized fluid collections. Spleen: Normal spleen Adrenals/urinary tract: Adrenal glands and kidneys are normal. The ureters and bladder normal. Stomach/Bowel: Large volume of oral contrast retained in the stomach. No evidence of obstructing lesion. Duodenum appears normal. Small bowel normal. Appendix normal. The colon and rectosigmoid colon are normal. Vascular/Lymphatic: No vascular complication associated presumed pancreatitis. Mild atherosclerotic calcification of the aorta. Abdominopelvic lymphadenopathy. Reproductive: Uterus and adnexa normal. 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 Other: No free fluid. Musculoskeletal: Posterior lumbar fusion. No aggressive osseous lesions. IMPRESSION: 1. Focal acute pancreatitis involving the uncinate process and head of the pancreas. 2. Prominence of the common bile duct without obstructing lesion. 3. Hepatic steatosis. 4. No cholelithiasis identified. 5. Moderate volume of oral contrast retained in the stomach. No obstructing lesion identified. Electronically Signed   By: Suzy Bouchard M.D.   On: 03/10/2019 14:29   US Abdomen Limited Ruq  Result Date: 03/10/2019 CLINICAL DATA:  Epigastric pain for 6 hours, ETOH. EXAM: ULTRASOUND ABDOMEN LIMITED RIGHT UPPER QUADRANT COMPARISON:  None. FINDINGS: Gallbladder: Gallbladder is filled with sludge. Associated gallbladder distension, moderate in degree. No gallbladder wall thickening or  pericholecystic fluid seen. No sonographic Murphy's sign elicited during the exam. Common bile duct: Diameter: 8 mm Liver: Liver is diffusely echogenic indicating fatty infiltration. No focal liver abnormality. Portal vein is patent on color Doppler imaging with normal direction of blood flow  towards the liver. Other: None. IMPRESSION: 1. Common bile duct is distended, measuring 8 mm. This may be due to obstructing stone in the distal common bile duct. Recommend correlation with LFTs and would consider further characterization with ERCP or MRCP. 2. Gallbladder is filled with sludge. No gallbladder wall thickening, sonographic Murphy's sign or other secondary signs of acute cholecystitis. 3. Fatty infiltration of the liver. Electronically Signed   By: Franki Cabot M.D.   On: 03/10/2019 12:37    Procedures Procedures (including critical care time)  Medications Ordered in ED Medications  piperacillin-tazobactam (ZOSYN) IVPB 3.375 g (3.375 g Intravenous New Bag/Given 03/10/19 1458)  ondansetron (ZOFRAN) injection 4 mg (4 mg Intravenous Given 03/10/19 0943)  pantoprazole (PROTONIX) injection 40 mg (40 mg Intravenous Given 03/10/19 0943)  morphine 4 MG/ML injection 4 mg (4 mg Intravenous Given 03/10/19 0943)  sodium chloride 0.9 % bolus 1,000 mL (0 mLs Intravenous Stopped 03/10/19 1054)  HYDROmorphone (DILAUDID) injection 1 mg (1 mg Intravenous Given 03/10/19 1056)  promethazine (PHENERGAN) injection 12.5 mg (12.5 mg Intravenous Given 03/10/19 1154)  iohexol (OMNIPAQUE) 300 MG/ML solution 30 mL (30 mLs Oral Contrast Given 03/10/19 1331)  iohexol (OMNIPAQUE) 300 MG/ML solution 100 mL (100 mLs Intravenous Contrast Given 03/10/19 1331)  HYDROmorphone (DILAUDID) injection 1 mg (1 mg Intravenous Given 03/10/19 1458)  sodium chloride 0.9 % bolus 1,000 mL (1,000 mLs Intravenous New Bag/Given 03/10/19 1508)     Initial Impression / Assessment and Plan / ED Course  I have reviewed the triage vital signs and the  nursing notes.  Pertinent labs & imaging results that were available during my care of the patient were reviewed by me and considered in my medical decision making (see chart for details).        Pt discussed with Dr. Gala Romney, discussed labs and imaging, recommends keeping patient n.p.o., push IV hydration, recommends adding Zosyn, will consult patient in the a.m. and decide if patient will require MRCP for further evaluation of possible common bile duct stone versus EtOH induced pancreatitis.  Discussed with hospitalist Dr. Denton Brick who will see and admit patient.  Final Clinical Impressions(s) / ED Diagnoses   Final diagnoses:  Upper abdominal pain  Acute pancreatitis, unspecified complication status, unspecified pancreatitis type  ETOH abuse  Elevated transaminase level    ED Discharge Orders    None       Landis Martins 03/10/19 1530    Fredia Sorrow, MD 03/10/19 [phone]

## 2019-03-10 NOTE — ED Triage Notes (Signed)
Pt reports abd pain starting about 2 am with vomiting. States she has had this before and dx with ulcers. Drinks several times weekly.

## 2019-03-10 NOTE — H&P (Signed)
History and Physical    Olivia Lester EXN:[id number] DOB: [birth year] DOA: 03/10/2019  PCP: Soyla Dryer, PA-C   Patient coming from: Home  I have personally briefly reviewed patient's old medical records in [place]  Chief Complaint: Abdominal pain, Vomiting.  HPI: Olivia Lester is a 43 y.o. female with medical history significant for  Alcohol abuse, gastric ulcer, hep C, presented to the ED, with sudden onset of upper abdominal pain that started about 2 AM last night.  Patient also reports multiple episodes of vomiting since onset at 2 AM.  She reports some specks of blood in vomitus.  But no overt blood or black vomitus.  No diarrhea, no black stools.  She drinks at least 8 bottles of beer daily.  She tells me her last drink was yesterday morning.  She also takes NSAIDs and narcotics for chronic back pain.  ED Course: Blood pressure systolic 496P to 591M, temperature 97.8.  WBC 18.6.  Potassium 3.1.  Elevated lipase 379, mildly elevated liver enzymes-AST 149, ALT 85.  UDS positive for opiates and cannabinoids.  UA positive for ketones.  Blood alcohol level 59.  Right upper quadrant ultrasound shows common bile duct dilatation at 8 mm, may be due to obstructing stone.  Gallbladder filled with sludge.  No Murphy sign.  With contrast showed focal acute pancreatitis involving the uncinate process and head of pancreas, also prominence of common bile duct without obstructing lesion. EDP talked to Dr. Gala Romney, recommended admission here, antibiotics, will see in a.m. if ERCP MRCP needed.  2 L bolus N/s and IV Zosyn started in ED.  Review of Systems: As per HPI all other systems reviewed and negative.  Past Medical History:  Diagnosis Date  . Back injury   . Gastric ulcer   . Gastric ulcer 10/14/2013  . Hepatitis C 10/14/2013  . History of kidney stones   . 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 Panic attack   . Pneumonia   . Polysubstance abuse ([place])   . PONV  (postoperative nausea and vomiting)     Past Surgical History:  Procedure Laterality Date  . BACK SURGERY    . BREAST SURGERY    . ESOPHAGOGASTRODUODENOSCOPY (EGD) WITH PROPOFOL N/A 10/14/2013   Procedure: ESOPHAGOGASTRODUODENOSCOPY (EGD) WITH PROPOFOL;  Surgeon: Rogene Houston, MD;  Location: [facility];  Service: Endoscopy;  Laterality: N/A;  . ORIF TOE FRACTURE Right 08/08/2018   Procedure: RIGHT FOOT INTERNAL FIXATION 5TH METATARSAL;  Surgeon: Newt Minion, MD;  Location: [facility];  Service: Orthopedics;  Laterality: Right;  . TONSILLECTOMY       reports that she has been smoking cigarettes. She has a 10.00 pack-year smoking history. She has never used smokeless tobacco. She reports current alcohol use. She reports current drug use. Drug: Marijuana.  No Known Allergies  Family History  Problem Relation Age of Onset  . Cancer Mother 8       ovarian   . Hypertension Mother   . Stroke Mother   . Early death Father   . Alcohol abuse Father     Prior to Admission medications   Medication Sig Start Date End Date Taking? Authorizing Provider  ibuprofen (ADVIL) 800 MG tablet Take 800 mg by mouth every 8 (eight) hours as needed for moderate pain.   Yes [provider]  clindamycin (CLEOCIN) 150 MG capsule 3 tabs PO TID x 10 days Patient not taking: Reported on 03/10/2019 10/23/18  Francine Graven, DO  naproxen (NAPROSYN) 250 MG tablet Take 1 tablet (250 mg total) by mouth 2 (two) times daily as needed for mild pain or moderate pain (take with food). Patient not taking: Reported on 03/10/2019 10/23/18   Francine Graven, DO  ondansetron (ZOFRAN) 8 MG tablet Take 1 tablet (8 mg total) by mouth every 4 (four) hours as needed. Patient not taking: Reported on 03/10/2019 11/04/18   Nat Christen, MD  oxyCODONE-acetaminophen (PERCOCET/ROXICET) 5-325 MG tablet Take 1 tablet by mouth every 8 (eight) hours as needed. Patient not taking: Reported on 03/10/2019 09/06/18   Rayburn, Neta Mends, PA-C  pantoprazole (PROTONIX) 40 MG tablet Take 40 mg by mouth 2 (two) times daily.     [provider]    Physical Exam: Vitals:   03/10/19 1100 03/10/19 1115 03/10/19 1130 03/10/19 1200  BP: (!) 170/107  (!) 170/100 (!) 161/97  Pulse: 88 85 82 72  Resp:      Temp:      TempSrc:      SpO2: 100% 98% 96% 100%  Weight:      Height:        Constitutional: NAD, calm, comfortable Vitals:   03/10/19 1100 03/10/19 1115 03/10/19 1130 03/10/19 1200  BP: (!) 170/107  (!) 170/100 (!) 161/97  Pulse: 88 85 82 72  Resp:      Temp:      TempSrc:      SpO2: 100% 98% 96% 100%  Weight:      Height:       Eyes: PERRL, lids and conjunctivae normal ENMT: Mucous membranes are moist. Posterior pharynx clear of any exudate or lesions.  Neck: normal, supple, no masses, no thyromegaly Respiratory: clear to auscultation bilaterally, no wheezing, no crackles. Normal respiratory effort. No accessory muscle use.  Cardiovascular: Regular rate and rhythm, no murmurs / rubs / gallops. No extremity edema. 2+ pedal pulses. No carotid bruits.  Abdomen: no tenderness, no masses palpated. No hepatosplenomegaly. Bowel sounds positive.  Musculoskeletal: no clubbing / cyanosis. No joint deformity upper and lower extremities. Good ROM, no contractures. Normal muscle tone.  Skin: no rashes, lesions, ulcers. No induration Neurologic: CN 2-12 grossly intact.  Strength 5/5 in all 4.  Psychiatric: Normal judgment and insight. Alert and oriented x 3. Normal mood.   Labs on Admission: I have personally reviewed following labs and imaging studies  CBC: Recent Labs  Lab 03/10/19 0936  WBC 18.6*  NEUTROABS 15.3*  HGB 14.6  HCT 44.9  MCV 98.2  PLT 662   Basic Metabolic Panel: Recent Labs  Lab 03/10/19 0936  NA 142  K 3.1*  CL 97*  CO2 22  GLUCOSE 88  BUN 11  CREATININE 0.63  CALCIUM 8.9   Liver Function Tests: Recent Labs  Lab 03/10/19 0936  AST 149*  ALT 85*  ALKPHOS 50   BILITOT 0.7  PROT 8.1  ALBUMIN 5.1*   Recent Labs  Lab 03/10/19 0936  LIPASE 379*   Urine analysis:    Component Value Date/Time   COLORURINE YELLOW 03/10/2019 1054   APPEARANCEUR CLEAR 03/10/2019 1054   APPEARANCEUR Clear 12/07/2013 0133   LABSPEC 1.012 03/10/2019 1054   LABSPEC 1.006 12/07/2013 0133   PHURINE 6.0 03/10/2019 [place] 03/10/2019 1054   GLUCOSEU Negative 12/07/2013 0133   HGBUR NEGATIVE 03/10/2019 1054   BILIRUBINUR NEGATIVE 03/10/2019 1054   BILIRUBINUR Negative 12/07/2013 0133   KETONESUR 20 (A) 03/10/2019 1054   [place] 03/10/2019 1054  UROBILINOGEN 0.2 04/28/2015 1559   NITRITE NEGATIVE 03/10/2019 1054   LEUKOCYTESUR NEGATIVE 03/10/2019 1054   LEUKOCYTESUR Negative 12/07/2013 0133    Radiological Exams on Admission: Ct Abdomen Pelvis W Contrast  Result Date: 03/10/2019 CLINICAL DATA:  Abdominal pain with vomiting. History of ulcers. History of hepatitis-C. EXAM: CT ABDOMEN AND PELVIS WITH CONTRAST TECHNIQUE: Multidetector CT imaging of the abdomen and pelvis was performed using the standard protocol following bolus administration of intravenous contrast. CONTRAST:  152mL OMNIPAQUE IOHEXOL 300 MG/ML SOLN, 49mL OMNIPAQUE IOHEXOL 300 MG/ML SOLN COMPARISON:  CT 10/12/2013, 01/22/2014 FINDINGS: Lower chest: Lung bases are clear. Hepatobiliary: Low-attenuation liver suggests hepatic steatosis. Gallbladder normal. No biliary duct dilatation. Common bile duct is prominent but within normal limits at 5 mm through the pancreatic head. No obstructing lesion identified. Pancreas: There is inflammation involving the pancreatic head. Small amount of peripancreatic fluid. No pancreatic duct dilatation. The body and tail the pancreas are normal. No organized fluid collections. Spleen: Normal spleen Adrenals/urinary tract: Adrenal glands and kidneys are normal. The ureters and bladder normal. Stomach/Bowel: Large volume of oral contrast retained in  the stomach. No evidence of obstructing lesion. Duodenum appears normal. Small bowel normal. Appendix normal. The colon and rectosigmoid colon are normal. Vascular/Lymphatic: No vascular complication associated presumed pancreatitis. Mild atherosclerotic calcification of the aorta. Abdominopelvic lymphadenopathy. Reproductive: Uterus and adnexa normal. 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 Other: No free fluid. Musculoskeletal: Posterior lumbar fusion. No aggressive osseous lesions. IMPRESSION: 1. Focal acute pancreatitis involving the uncinate process and head of the pancreas. 2. Prominence of the common bile duct without obstructing lesion. 3. Hepatic steatosis. 4. No cholelithiasis identified. 5. Moderate volume of oral contrast retained in the stomach. No obstructing lesion identified. Electronically Signed   By: Suzy Bouchard M.D.   On: 03/10/2019 14:29   US Abdomen Limited Ruq  Result Date: 03/10/2019 CLINICAL DATA:  Epigastric pain for 6 hours, ETOH. EXAM: ULTRASOUND ABDOMEN LIMITED RIGHT UPPER QUADRANT COMPARISON:  None. FINDINGS: Gallbladder: Gallbladder is filled with sludge. Associated gallbladder distension, moderate in degree. No gallbladder wall thickening or pericholecystic fluid seen. No sonographic Murphy's sign elicited during the exam. Common bile duct: Diameter: 8 mm Liver: Liver is diffusely echogenic indicating fatty infiltration. No focal liver abnormality. Portal vein is patent on color Doppler imaging with normal direction of blood flow towards the liver. Other: None. IMPRESSION: 1. Common bile duct is distended, measuring 8 mm. This may be due to obstructing stone in the distal common bile duct. Recommend correlation with LFTs and would consider further characterization with ERCP or MRCP. 2. Gallbladder is filled with sludge. No gallbladder wall thickening, sonographic Murphy's sign or other secondary signs of acute cholecystitis. 3. Fatty infiltration of the liver.  Electronically Signed   By: Franki Cabot M.D.   On: 03/10/2019 12:37    EKG: None.  Assessment/Plan Active Problems:   Acute pancreatitis  Acute pancreatitis- upper abdominal pain, vomiting.  Alcoholic versus gallstone stone pancreatitis.  Lipase elevated 379.  Abdominal CT shows acute pancreatitis with CBD dilatation.  Without obstructing lesion.  Mild transaminitis, with normal ALP and bilirubin.  Leukocytosis 18.6.  GI Consulted in ED will see in a.m. -Continue IV Zosyn for prophylaxis for ascending cholangitis considering possible common bile duct obstruction. - N.p.o. -Follow-up GI recommendations - D5 N/s = 40 KCL 100cc/hr x 1 day -CBC CMP a.m. -IV morphine 4 mg, antiemetics PRN  Elevated liver enzymes- AST 149, ALT 85.  In pattern suggestive of alcoholic  hepatitis.  With normal ALP and bilirubin.  Right upper quadrant ultrasound shows CBD at 8 mm possible common bile duct stone.  ERCP or MRCP recommended. -CMP CBC a.m. -Hydrate  Hypokalemia-3.1.  Likely from multiple episodes of vomiting.  History of alcohol abuse. -Replete, CMP a.m. -Check magnesium  History of gastric ulcer-reports specks of blood in vomitus, denies melena.  Hemoglobin stable at 14.6.  Possible gastritis also considering history of alcohol abuse and NSAID use. -IV Protonix 40 daily -Trend hemoglobin -SCDs  Polysubstance abuse-alcohol, tobacco, cannabinoids, opiates. Drinks at least 8 beers daily.  Reports last drink yesterday morning.  Blood alcohol level 59. -CIWA protocol -Thiamine folate multivitamins -Check magnesium -Patient counseled on quitting substance use.  Chronic back pain-on NSAIDs and opiates. -IV morphine for pain for now.   DVT prophylaxis: SCDs Code Status: Full Family Communication: None at bedside Disposition Plan: Per rounding team Consults called: Gastroenterology Admission status: Inpatient, MedSurg I certify that at the point of admission it is my clinical judgment that  the patient will require inpatient hospital care spanning beyond 2 midnights from the point of admission due to high intensity of service, high risk for further deterioration and high frequency of surveillance required. The following factors support the patient status of inpatient: Acute pancreatitis, alcoholic versus gallstone requiring IV antibiotics IV hydration.  May need MRCP or ERCP inpatient.   Bethena Roys MD [facility]  03/10/2019, 4:11 PM

## 2019-03-11 ENCOUNTER — Inpatient Hospital Stay (HOSPITAL_COMMUNITY): Payer: Self-pay

## 2019-03-11 DIAGNOSIS — F1010 Alcohol abuse, uncomplicated: Secondary | ICD-10-CM

## 2019-03-11 DIAGNOSIS — F1210 Cannabis abuse, uncomplicated: Secondary | ICD-10-CM

## 2019-03-11 DIAGNOSIS — K859 Acute pancreatitis without necrosis or infection, unspecified: Secondary | ICD-10-CM

## 2019-03-11 DIAGNOSIS — R740 Nonspecific elevation of levels of transaminase and lactic acid dehydrogenase [LDH]: Secondary | ICD-10-CM

## 2019-03-11 DIAGNOSIS — F17200 Nicotine dependence, unspecified, uncomplicated: Secondary | ICD-10-CM

## 2019-03-11 DIAGNOSIS — R7401 Elevation of levels of liver transaminase levels: Secondary | ICD-10-CM

## 2019-03-11 LAB — CBC
HCT: 42.7 % (ref 36.0–46.0)
Hemoglobin: 14.2 g/dL (ref 12.0–15.0)
MCH: 32.6 pg (ref 26.0–34.0)
MCHC: 33.3 g/dL (ref 30.0–36.0)
MCV: 98.2 fL (ref 80.0–100.0)
Platelets: 153 10*3/uL (ref 150–400)
RBC: 4.35 MIL/uL (ref 3.87–5.11)
RDW: 13.2 % (ref 11.5–15.5)
WBC: 12.8 10*3/uL — ABNORMAL HIGH (ref 4.0–10.5)
nRBC: 0 % (ref 0.0–0.2)

## 2019-03-11 LAB — COMPREHENSIVE METABOLIC PANEL
ALT: 62 U/L — ABNORMAL HIGH (ref 0–44)
AST: 84 U/L — ABNORMAL HIGH (ref 15–41)
Albumin: 4.3 g/dL (ref 3.5–5.0)
Alkaline Phosphatase: 47 U/L (ref 38–126)
Anion gap: 9 (ref 5–15)
BUN: 7 mg/dL (ref 6–20)
CO2: 28 mmol/L (ref 22–32)
Calcium: 8.7 mg/dL — ABNORMAL LOW (ref 8.9–10.3)
Chloride: 99 mmol/L (ref 98–111)
Creatinine, Ser: 0.51 mg/dL (ref 0.44–1.00)
GFR calc Af Amer: >60 mL/min (ref >60)
GFR calc non Af Amer: >60 mL/min (ref >60)
Glucose, Bld: 94 mg/dL (ref 70–99)
Potassium: 3.8 mmol/L (ref 3.5–5.1)
Sodium: 136 mmol/L (ref 135–145)
Total Bilirubin: 1 mg/dL (ref 0.3–1.2)
Total Protein: 7.1 g/dL (ref 6.5–8.1)

## 2019-03-11 MED ORDER — ONDANSETRON HCL 4 MG/2ML IJ SOLN
4.0000 mg | Freq: Four times a day (QID) | INTRAMUSCULAR | Status: DC
  Administered 2019-03-11 – 2019-03-12 (×4): 4 mg via INTRAVENOUS
  Filled 2019-03-11 (×4): qty 2

## 2019-03-11 MED ORDER — PROMETHAZINE HCL 25 MG/ML IJ SOLN
12.5000 mg | Freq: Four times a day (QID) | INTRAMUSCULAR | Status: DC | PRN
  Administered 2019-03-11 – 2019-03-12 (×3): 12.5 mg via INTRAVENOUS
  Filled 2019-03-11 (×3): qty 1

## 2019-03-11 MED ORDER — GADOBUTROL 1 MMOL/ML IV SOLN
6.0000 mL | Freq: Once | INTRAVENOUS | Status: AC | PRN
  Administered 2019-03-11: 15:00:00 6 mL via INTRAVENOUS

## 2019-03-11 MED ORDER — NICOTINE 21 MG/24HR TD PT24
21.0000 mg | MEDICATED_PATCH | Freq: Every day | TRANSDERMAL | Status: DC
  Administered 2019-03-11 – 2019-03-12 (×2): 21 mg via TRANSDERMAL
  Filled 2019-03-11 (×2): qty 1

## 2019-03-11 MED ORDER — POTASSIUM CHLORIDE IN NACL 20-0.9 MEQ/L-% IV SOLN
INTRAVENOUS | Status: DC
  Administered 2019-03-11 – 2019-03-12 (×3): via INTRAVENOUS

## 2019-03-11 MED ORDER — SODIUM CHLORIDE FLUSH 0.9 % IV SOLN
INTRAVENOUS | Status: AC
  Filled 2019-03-11: qty 200

## 2019-03-11 MED ORDER — SODIUM CHLORIDE (PF) 0.9 % IJ SOLN
INTRAMUSCULAR | Status: AC
  Filled 2019-03-11: qty 250

## 2019-03-11 MED ORDER — PANTOPRAZOLE SODIUM 40 MG IV SOLR
40.0000 mg | Freq: Two times a day (BID) | INTRAVENOUS | Status: DC
  Administered 2019-03-11 – 2019-03-12 (×2): 40 mg via INTRAVENOUS
  Filled 2019-03-11 (×2): qty 40

## 2019-03-11 NOTE — Progress Notes (Signed)
Initial Nutrition Assessment  DOCUMENTATION CODES:     INTERVENTION:  Follow for diet advancement - assess adequacy of intake  When diet advanced focus -6 small meals, avoid excess fat  Provided Handout : Pancreatitis Nutrition Therapy  NUTRITION DIAGNOSIS:   Increased nutrient needs related to acute illness(alcholoic pancreatitis) as evidenced by NPO status(CT findings-acute pancreatitis).   GOAL:   Patient will meet greater than or equal to 90% of their needs  MONITOR:   Diet advancement, PO intake, Weight trends, Labs  REASON FOR ASSESSMENT:   Malnutrition Screening Tool    ASSESSMENT: Patient is a 43 yo female with history of polysubstance abuse-daily ETOH intake. She presents with abdominal pain, vomiting. CT of abdomen/pelvis-findings for acute pancreatitis.    Patient NPO and says she has not eaten since last Friday. She is complaining of pain and asking for nurse to bring her pain meds. She reports usual food intake as irregular and says "I don't eat". Provided Pancreatis Nutrition Therapy handout and talked with her about basics- healthful diet once she is cleared to resume oral intake and limiting excess fat during inflammation.  Patient weighed 50 kg 6 months ago. A gain of 10% which is significant for time period. NFPE not completed today. Expect patient is malnourished given her daily ETOH and polysubstance abuse and self-reported erratic nutrition intake.   Medications reviewed and include: folvite, MVI, Nicoderm, Zofran, Protonix, Thiamine.  IVF-NS@ 125 ml/hr   Labs: BMP Latest Ref Rng & Units 03/11/2019 03/10/2019 11/04/2018  Glucose 70 - 99 mg/dL 94 88 108(H)  BUN 6 - 20 mg/dL 7 11 16   Creatinine 0.44 - 1.00 mg/dL 0.51 0.63 0.71  BUN/Creat Ratio 9 - 23 - - -  Sodium 135 - 145 mmol/L 136 142 144  Potassium 3.5 - 5.1 mmol/L 3.8 3.1(L) 3.6  Chloride 98 - 111 mmol/L 99 97(L) 111  CO2 22 - 32 mmol/L 28 22 22   Calcium 8.9 - 10.3 mg/dL 8.7(L) 8.9 8.1(L)      NUTRITION - FOCUSED PHYSICAL EXAM:  Unable to complete Nutrition-Focused physical exam at this time.    Diet Order:   Diet Order            Diet NPO time specified  Diet effective now             EDUCATION NEEDS:   Education needs have been addressed Skin:  Skin Assessment: Reviewed RN Assessment  Last BM:  8/15  Height:   Ht Readings from Last 1 Encounters:  03/10/19 5\' 6"  (1.676 m)    Weight:   Wt Readings from Last 1 Encounters:  03/10/19 55 kg    Ideal Body Weight:  59 kg  BMI:  Body mass index is 19.57 kg/m.  Estimated Nutritional Needs:   Kcal:  1650-1925 (30-35 kcal/kg/bw)  Protein:  80-85 gr (~1.5 gr/kg/bw)  Fluid:  1.7-1.9 liters daily  Colman Cater MS,RD,CSG,LDN Office: [phone] Pager: [phone]

## 2019-03-11 NOTE — Progress Notes (Addendum)
PROGRESS NOTE  Olivia Lester ZRA:[id number] DOB: May 05, 1976 DOA: 03/10/2019 PCP: Soyla Dryer, PA-C  Brief History:  43 year old female with a history of alcohol abuse, hepatitis C, anxiety, chronic back pain presenting with nausea, vomiting, abdominal pain that began on 03/09/2019.  The patient states that she vomited innumerable times with occasional blood noted on 03/10/2019.  She denied any fevers, chills, chest pain, shortness breath, headache, neck pain.  She drinks alcohol on a daily basis.  She states that she drinks at least 8 bottles of beer and numerous shots of tequila.  The patient is also been smoking 1 pack/day and using marijuana.  She denies any diarrhea, hematochezia, melena, dysuria, hematuria. In the emergency department, the patient was afebrile hemodynamically stable with oxygen saturation 1% room air.  WBC was 18.6.  AST 149, ALT 85, alkaline phosphatase 50, total bilirubin 0.7, lipase 379.  Alcohol level 59.  Urine drug screen positive for opiates and THC.  CT of the abdomen and pelvis showed hepatic steatosis with prominent common bile duct of 5 mm.  There was inflammation in the pancreatic head with a small amount of peripancreatic fluid.  Assessment/Plan: Acute Alcoholic Pancreatitis -ice chips only for now -continue IVF -judicious IV morphine -RUQ US--Common bile duct is distended, measuring 8 mm.  -MRCP -lipid panel -Emesis seems to be improving but she remains nauseous -Zofran around-the-clock  Hematemesis -Mallory-Weiss tear vs Etoh gastritis/esophagitis -Protonix bid  Polysubstance abuse -Including tobacco, alcohol, opioids -PMP Aware queried--last opioid prescription for Percocet on 09/06/2018  Tobacco abuse -NicoDerm patch  Alcohol abuse -Alcohol withdrawal protocol       Disposition Plan:   Home in 1-2 days  Family Communication:   No Family at bedside  Consultants:    Code Status:  FULL   DVT Prophylaxis:   [place] Lovenox    Procedures: As Listed in Progress Note Above  Antibiotics: None       Subjective: Patient remains nauseous, but states that vomiting has somewhat improved.  She denies any headache, chest pain, shortness breath, fever, chills, dysuria, hematuria, diarrhea, hematochezia, melena.  Objective: Vitals:   03/10/19 1200 03/10/19 1712 03/11/19 0014 03/11/19 0602  BP: (!) 161/97 (!) 184/101 (!) 160/91 (!) 182/109  Pulse: 72 71 69 65  Resp:  16 20 16   Temp:  98 F (36.7 C) 98.7 F (37.1 C) 98.9 F (37.2 C)  TempSrc:  Oral Oral Oral  SpO2: 100% 100% 100% 98%  Weight:  55 kg    Height:  5\' 6"  (1.676 m)      Intake/Output Summary (Last 24 hours) at 03/11/2019 0911 Last data filed at 03/11/2019 0800 Gross per 24 hour  Intake 3017.89 ml  Output 1 ml  Net 3016.89 ml   Weight change:  Exam:   General:  Pt is alert, follows commands appropriately, not in acute distress  HEENT: No icterus, No thrush, No neck mass, [place]/AT  Cardiovascular: RRR, S1/S2, no rubs, no gallops  Respiratory: CTA bilaterally, no wheezing, no crackles, no rhonchi  Abdomen: Soft/+BS, epigastric tender, non distended, no guarding  Extremities: No edema, No lymphangitis, No petechiae, No rashes, no synovitis   Data Reviewed: I have personally reviewed following labs and imaging studies Basic Metabolic Panel: Recent Labs  Lab 03/10/19 0936 03/11/19 0441  NA 142 136  K 3.1* 3.8  CL 97* 99  CO2 22 28  GLUCOSE 88 94  BUN 11 7  CREATININE 0.63 0.51  CALCIUM  8.9 8.7*   Liver Function Tests: Recent Labs  Lab 03/10/19 0936 03/11/19 0441  AST 149* 84*  ALT 85* 62*  ALKPHOS 50 47  BILITOT 0.7 1.0  PROT 8.1 7.1  ALBUMIN 5.1* 4.3   Recent Labs  Lab 03/10/19 0936  LIPASE 379*   No results for input(s): AMMONIA in the last 168 hours. Coagulation Profile: No results for input(s): INR, PROTIME in the last 168 hours. CBC: Recent Labs  Lab 03/10/19 0936 03/11/19 0441  WBC 18.6* 12.8*   NEUTROABS 15.3*  --   HGB 14.6 14.2  HCT 44.9 42.7  MCV 98.2 98.2  PLT 248 153   Cardiac Enzymes: No results for input(s): CKTOTAL, CKMB, CKMBINDEX, TROPONINI in the last 168 hours. BNP: Invalid input(s): POCBNP CBG: No results for input(s): GLUCAP in the last 168 hours. HbA1C: No results for input(s): HGBA1C in the last 72 hours. Urine analysis:    Component Value Date/Time   COLORURINE YELLOW 03/10/2019 1054   APPEARANCEUR CLEAR 03/10/2019 1054   APPEARANCEUR Clear 12/07/2013 0133   LABSPEC 1.012 03/10/2019 1054   LABSPEC 1.006 12/07/2013 0133   PHURINE 6.0 03/10/2019 1054   GLUCOSEU NEGATIVE 03/10/2019 1054   GLUCOSEU Negative 12/07/2013 0133   HGBUR NEGATIVE 03/10/2019 1054   BILIRUBINUR NEGATIVE 03/10/2019 1054   BILIRUBINUR Negative 12/07/2013 0133   KETONESUR 20 (A) 03/10/2019 1054   PROTEINUR NEGATIVE 03/10/2019 1054   UROBILINOGEN 0.2 04/28/2015 1559   NITRITE NEGATIVE 03/10/2019 1054   LEUKOCYTESUR NEGATIVE 03/10/2019 1054   LEUKOCYTESUR Negative 12/07/2013 0133   Sepsis Labs: @LABRCNTIP (procalcitonin:4,lacticidven:4) ) Recent Results (from the past 240 hour(s))  SARS Coronavirus 2 [facility] order, Performed in [facility] hospital lab) Nasopharyngeal Nasopharyngeal Swab     Status: None   Collection Time: 03/10/19  3:37 PM   Specimen: Nasopharyngeal Swab  Result Value Ref Range Status   SARS Coronavirus 2 NEGATIVE NEGATIVE Final    Comment: (NOTE) If result is NEGATIVE SARS-CoV-2 target nucleic acids are NOT DETECTED. The SARS-CoV-2 RNA is generally detectable in upper and lower  respiratory specimens during the acute phase of infection. The lowest  concentration of SARS-CoV-2 viral copies this assay can detect is 250  copies / mL. A negative result does not preclude SARS-CoV-2 infection  and should not be used as the sole basis for treatment or other  patient management decisions.  A negative result may occur with  improper specimen collection /  handling, submission of specimen other  than nasopharyngeal swab, presence of viral mutation(s) within the  areas targeted by this assay, and inadequate number of viral copies  (<250 copies / mL). A negative result must be combined with clinical  observations, patient history, and epidemiological information. If result is POSITIVE SARS-CoV-2 target nucleic acids are DETECTED. The SARS-CoV-2 RNA is generally detectable in upper and lower  respiratory specimens dur ing the acute phase of infection.  Positive  results are indicative of active infection with SARS-CoV-2.  Clinical  correlation with patient history and other diagnostic information is  necessary to determine patient infection status.  Positive results do  not rule out bacterial infection or co-infection with other viruses. If result is PRESUMPTIVE POSTIVE SARS-CoV-2 nucleic acids MAY BE PRESENT.   A presumptive positive result was obtained on the submitted specimen  and confirmed on repeat testing.  While 2019 novel coronavirus  (SARS-CoV-2) nucleic acids may be present in the submitted sample  additional confirmatory testing may be necessary for epidemiological  and / or clinical management  purposes  to differentiate between  SARS-CoV-2 and other Sarbecovirus currently known to infect humans.  If clinically indicated additional testing with an alternate test  methodology [phone]) is advised. The SARS-CoV-2 RNA is generally  detectable in upper and lower respiratory sp ecimens during the acute  phase of infection. The expected result is Negative. Fact Sheet for Patients:  StrictlyIdeas.no Fact Sheet for Healthcare Providers: BankingDealers.co.za This test is not yet approved or cleared by the [place] FDA and has been authorized for detection and/or diagnosis of SARS-CoV-2 by FDA under an Emergency Use Authorization (EUA).  This EUA will remain in effect (meaning this  test can be used) for the duration of the COVID-19 declaration under Section 564(b)(1) of the Act, 21 U.S.C. section 360bbb-3(b)(1), unless the authorization is terminated or revoked sooner. Performed at [facility], [address] [place], [place] [address]      Scheduled Meds: . chlorhexidine  15 mL Mouth Rinse BID  . folic acid  1 mg Oral Daily  . mouth rinse  15 mL Mouth Rinse q12n4p  . multivitamin with minerals  1 tablet Oral Daily  . ondansetron (ZOFRAN) IV  4 mg Intravenous Q6H  . pantoprazole (PROTONIX) IV  40 mg Intravenous Q24H  . thiamine  100 mg Oral Daily   Or  . thiamine  100 mg Intravenous Daily   Continuous Infusions: . dextrose 5 % and 0.9 % NaCl with KCl 40 mEq/L 100 mL/hr at 03/11/19 0340    Procedures/Studies: Ct Abdomen Pelvis W Contrast  Result Date: 03/10/2019 CLINICAL DATA:  Abdominal pain with vomiting. History of ulcers. History of hepatitis-C. EXAM: CT ABDOMEN AND PELVIS WITH CONTRAST TECHNIQUE: Multidetector CT imaging of the abdomen and pelvis was performed using the standard protocol following bolus administration of intravenous contrast. CONTRAST:  170mL OMNIPAQUE IOHEXOL 300 MG/ML SOLN, 59mL OMNIPAQUE IOHEXOL 300 MG/ML SOLN COMPARISON:  CT 10/12/2013, 01/22/2014 FINDINGS: Lower chest: Lung bases are clear. Hepatobiliary: Low-attenuation liver suggests hepatic steatosis. Gallbladder normal. No biliary duct dilatation. Common bile duct is prominent but within normal limits at 5 mm through the pancreatic head. No obstructing lesion identified. Pancreas: There is inflammation involving the pancreatic head. Small amount of peripancreatic fluid. No pancreatic duct dilatation. The body and tail the pancreas are normal. No organized fluid collections. Spleen: Normal spleen Adrenals/urinary tract: Adrenal glands and kidneys are normal. The ureters and bladder normal. Stomach/Bowel: Large volume of oral contrast retained in the stomach. No evidence of obstructing  lesion. Duodenum appears normal. Small bowel normal. Appendix normal. The colon and rectosigmoid colon are normal. Vascular/Lymphatic: No vascular complication associated presumed pancreatitis. Mild atherosclerotic calcification of the aorta. Abdominopelvic lymphadenopathy. Reproductive: Uterus and adnexa normal. 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 Other: No free fluid. Musculoskeletal: Posterior lumbar fusion. No aggressive osseous lesions. IMPRESSION: 1. Focal acute pancreatitis involving the uncinate process and head of the pancreas. 2. Prominence of the common bile duct without obstructing lesion. 3. Hepatic steatosis. 4. No cholelithiasis identified. 5. Moderate volume of oral contrast retained in the stomach. No obstructing lesion identified. Electronically Signed   By: Suzy Bouchard M.D.   On: 03/10/2019 14:29   US Abdomen Limited Ruq  Result Date: 03/10/2019 CLINICAL DATA:  Epigastric pain for 6 hours, ETOH. EXAM: ULTRASOUND ABDOMEN LIMITED RIGHT UPPER QUADRANT COMPARISON:  None. FINDINGS: Gallbladder: Gallbladder is filled with sludge. Associated gallbladder distension, moderate in degree. No gallbladder wall thickening or pericholecystic fluid seen. No sonographic Murphy's sign elicited during the exam. Common  bile duct: Diameter: 8 mm Liver: Liver is diffusely echogenic indicating fatty infiltration. No focal liver abnormality. Portal vein is patent on color Doppler imaging with normal direction of blood flow towards the liver. Other: None. IMPRESSION: 1. Common bile duct is distended, measuring 8 mm. This may be due to obstructing stone in the distal common bile duct. Recommend correlation with LFTs and would consider further characterization with ERCP or MRCP. 2. Gallbladder is filled with sludge. No gallbladder wall thickening, sonographic Murphy's sign or other secondary signs of acute cholecystitis. 3. Fatty infiltration of the liver. Electronically Signed   By: Franki Cabot M.D.    On: 03/10/2019 12:37    Orson Eva, DO  [facility] Pager [phone]  If 7PM-7AM, please contact night-coverage [email or web address] Password [email or web address] 03/11/2019, 9:11 AM   LOS: 1 day

## 2019-03-11 NOTE — Consult Note (Signed)
Referring Provider: Orson Eva, DO Primary Care Physician:  Soyla Dryer, PA-C Primary Gastroenterologist:  Dr. Laural Golden  Reason for Consultation:    Acute pancreatitis and elevated transaminases  HPI:   Patient is 43 year old Caucasian female who has a history of gastric ulcer(March 2015) history of alcohol abuse as well as hepatitis C successfully treated who was in usual state of health until 2:00 yesterday morning when she woke up with excruciating pain in her epigastric region radiating posteriorly.  This pain was associated with nausea and vomiting.  She says she heaved multiple times and on one occasion noted small amount of blood.  She waited but pain kept getting worse and finally she came to emergency room 6 hours later.  She was noted to have elevated serum lipase and transaminases.  Her WBC was 18.6.  Ethanol level was 59 mg/dL.  Ultrasound revealed sludge in the gallbladder without wall thickening fatty liver and bile duct measured 8 mm.  Abdominal pelvic CT revealed fatty liver bile duct measuring 8 mm and focal changes of pancreatitis involving the pancreatic head and uncinate process. Patient was admitted to hospitalist service.  She was begun on IV fluids, IV pantoprazole morphine for analgesia as well as thiamine.  She was also begun on Zosyn.  She is also on CIWA protocol to prevent alcohol withdrawal. Patient had MRCP earlier today which revealed mildly dilated bile duct but no evidence of choledocholithiasis.  Study also did not show any stones in gallbladder and once again changes of nonnecrotizing pancreatitis seen involving pancreatic head and neck. Patient was feeling better this afternoon and she was begun on clear liquids.  Patient reports decrease in severity of pain.  She has not had any emesis today.  She denies melena or rectal bleeding.  She said her bowels generally move every 3 to 4 days.  She has a history of alcohol abuse.  Following her hospitalization back in 2015  she did not drink for 3 years and slowly she is gone back to drinking.  Previously she was drinking liquor and now she has been drinking beer.  She says on days when she works she drinks 1 to 2 cans and when she is not working she could drink as many as 10 cans/day.  She takes ibuprofen for menstrual cramps or back pain and she rarely takes it more than 6 times in a month.  She never takes more than 800 milligrams on a given day. Patient states she has lost few pounds since she started to drink about 2 years ago.  The most she has ever weighed is under 30 pounds. During her last hospitalization she was also diagnosed with hepatitis 1a.  She received therapy while she was incarcerated.  She has not had any follow-up testing. She smokes half a pack of cigarettes per day. Patient has 3 healthy children from her first marriage but none from her second marriage.  She works at a [employer]. Her father died at age 35 secondary to alcohol abuse. Her mother is alive.  She has been treated for ovarian carcinoma and has had a stroke.  Patient says she has a brother and a sister and they are both IV drug abusers and she is not in close contact with them   Past Medical History:  Diagnosis Date  . Back injury   .    Marland Kitchen Gastric ulcer H. pylori negative 10/14/2013  . Hepatitis C   treated with SVR. 10/14/2013  . History of kidney stones   .  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 Panic attack   . Pneumonia   . Polysubstance abuse ([place])   . PONV (postoperative nausea and vomiting)     Past Surgical History:  Procedure Laterality Date  . BACK SURGERY    . BREAST SURGERY    . ESOPHAGOGASTRODUODENOSCOPY (EGD) WITH PROPOFOL N/A 10/14/2013   Procedure: ESOPHAGOGASTRODUODENOSCOPY (EGD) WITH PROPOFOL;  Surgeon: Rogene Houston, MD;  Location: [facility];  Service: Endoscopy;  Laterality: N/A;  . ORIF TOE FRACTURE Right 08/08/2018   Procedure: RIGHT FOOT INTERNAL FIXATION 5TH METATARSAL;  Surgeon:  Newt Minion, MD;  Location: [facility];  Service: Orthopedics;  Laterality: Right;  . TONSILLECTOMY      Prior to Admission medications   Medication Sig Start Date End Date Taking? Authorizing Provider  ibuprofen (ADVIL) 800 MG tablet Take 800 mg by mouth every 8 (eight) hours as needed for moderate pain.   Yes [provider]  clindamycin (CLEOCIN) 150 MG capsule 3 tabs PO TID x 10 days Patient not taking: Reported on 03/10/2019 10/23/18   Francine Graven, DO  naproxen (NAPROSYN) 250 MG tablet Take 1 tablet (250 mg total) by mouth 2 (two) times daily as needed for mild pain or moderate pain (take with food). Patient not taking: Reported on 03/10/2019 10/23/18   Francine Graven, DO  ondansetron (ZOFRAN) 8 MG tablet Take 1 tablet (8 mg total) by mouth every 4 (four) hours as needed. Patient not taking: Reported on 03/10/2019 11/04/18   Nat Christen, MD  oxyCODONE-acetaminophen (PERCOCET/ROXICET) 5-325 MG tablet Take 1 tablet by mouth every 8 (eight) hours as needed. Patient not taking: Reported on 03/10/2019 09/06/18   Rayburn, Neta Mends, PA-C  pantoprazole (PROTONIX) 40 MG tablet Take 40 mg by mouth 2 (two) times daily.     [provider]    Current Facility-Administered Medications  Medication Dose Route Frequency Provider Last Rate Last Dose  . 0.9 % NaCl with KCl 20 mEq/ L  infusion   Intravenous Continuous Tat, Shanon Brow, MD 125 mL/hr at 03/11/19 1047    . acetaminophen (TYLENOL) tablet 650 mg  650 mg Oral Q6H PRN Emokpae, Ejiroghene E, MD       Or  . acetaminophen (TYLENOL) suppository 650 mg  650 mg Rectal Q6H PRN Emokpae, Ejiroghene E, MD      . chlorhexidine (PERIDEX) 0.12 % solution 15 mL  15 mL Mouth Rinse BID Emokpae, Ejiroghene E, MD   15 mL at 03/10/19 2219  . folic acid (FOLVITE) tablet 1 mg  1 mg Oral Daily Emokpae, Ejiroghene E, MD   1 mg at 03/10/19 1743  . LORazepam (ATIVAN) tablet 1 mg  1 mg Oral Q6H PRN Emokpae, Ejiroghene E, MD       Or  . LORazepam  (ATIVAN) injection 1 mg  1 mg Intravenous Q6H PRN Emokpae, Ejiroghene E, MD   1 mg at 03/11/19 1212  . MEDLINE mouth rinse  15 mL Mouth Rinse q12n4p Emokpae, Ejiroghene E, MD      . morphine 4 MG/ML injection 4 mg  4 mg Intravenous Q4H PRN Emokpae, Ejiroghene E, MD   4 mg at 03/11/19 1606  . multivitamin with minerals tablet 1 tablet  1 tablet Oral Daily Emokpae, Ejiroghene E, MD   1 tablet at 03/10/19 1742  . nicotine (NICODERM CQ - dosed in mg/24 hours) patch 21 mg  21 mg Transdermal Daily Tat, David, MD   21 mg at 03/11/19 1038  .  ondansetron (ZOFRAN) injection 4 mg  4 mg Intravenous Q6H PRN Emokpae, Ejiroghene E, MD   4 mg at 03/11/19 0612  . ondansetron (ZOFRAN) injection 4 mg  4 mg Intravenous Q6H Orson Eva, MD   4 mg at 03/11/19 1037  . pantoprazole (PROTONIX) injection 40 mg  40 mg Intravenous Q12H Tat, Shanon Brow, MD      . promethazine (PHENERGAN) injection 12.5 mg  12.5 mg Intravenous Q6H PRN Tat, Shanon Brow, MD   12.5 mg at 03/11/19 1038  . sodium chloride (PF) 0.9 % injection           . sodium chloride flush 0.9 % injection           . thiamine (VITAMIN B-1) tablet 100 mg  100 mg Oral Daily Emokpae, Ejiroghene E, MD   100 mg at 03/10/19 1742   Or  . thiamine (B-1) injection 100 mg  100 mg Intravenous Daily Emokpae, Ejiroghene E, MD   100 mg at 03/11/19 1037    Allergies as of 03/10/2019  . (No Known Allergies)    Family History  Problem Relation Age of Onset  . Cancer Mother 17       ovarian   . Hypertension Mother   . Stroke Mother   . Early death Father   . Alcohol abuse Father     Social History   Socioeconomic History  . Marital status: Divorced    Spouse name: Not on file  . Number of children: Not on file  . Years of education: Not on file  . Highest education level: Not on file  Occupational History  . Not on file  Social Needs  . Financial resource strain: Not on file  . Food insecurity    Worry: Not on file    Inability: Not on file  . Transportation needs     Medical: Not on file    Non-medical: Not on file  Tobacco Use  . Smoking status: Current Every Day Smoker    Packs/day: 0.50    Years: 20.00    Pack years: 10.00    Types: Cigarettes  . Smokeless tobacco: Never Used  Substance and Sexual Activity  . Alcohol use: Yes    Frequency: Never    Comment: several times weekly  . Drug use: Yes    Types: Marijuana  . Sexual activity: Not Currently    Birth control/protection: None  Lifestyle  . Physical activity    Days per week: Not on file    Minutes per session: Not on file  . Stress: Not on file  Relationships  . Social Herbalist on phone: Not on file    Gets together: Not on file    Attends religious service: Not on file    Active member of club or organization: Not on file    Attends meetings of clubs or organizations: Not on file    Relationship status: Not on file  . Intimate partner violence    Fear of current or ex partner: Not on file    Emotionally abused: Not on file    Physically abused: Not on file    Forced sexual activity: Not on file  Other Topics Concern  . Not on file  Social History Narrative  . Not on file    Review of Systems: See HPI, otherwise normal ROS  Physical Exam: Temp:  [98 F (36.7 C)-98.9 F (37.2 C)] 98.9 F (37.2 C) (08/17 0602) Pulse Rate:  [65-79] 79 (  08/17 1406) Resp:  [16-20] 19 (08/17 1406) BP: (160-184)/(91-109) 163/105 (08/17 1406) SpO2:  [98 %-100 %] 100 % (08/17 1406) Weight:  [55 kg] 55 kg (08/16 1712) Last BM Date: 03/09/19   Patient is alert and in no acute distress. Conjunctiva is pink.  Sclerae nonicteric. Oropharyngeal mucosa is normal.  She has several teeth missing.  She has 5 left and upper joint few more in the lower jaw. No neck masses or thyromegaly. Cardiac exam with regular rhythm normal S1 and S2.  No murmur or gallop noted. Auscultation of lungs reveal vesicular breath sounds bilaterally. Abdomen is flat.  Bowel sounds are normal.  On  palpation abdomen is soft.  She has moderate midepigastric tenderness with some guarding.  She has mild tenderness in both subcostal regions.  No organomegaly or masses. Extremities are thin but no clubbing or edema noted.  Lab Results: Recent Labs    03/10/19 0936 03/11/19 0441  WBC 18.6* 12.8*  HGB 14.6 14.2  HCT 44.9 42.7  PLT 248 153   BMET Recent Labs    03/10/19 0936 03/11/19 0441  NA 142 136  K 3.1* 3.8  CL 97* 99  CO2 22 28  GLUCOSE 88 94  BUN 11 7  CREATININE 0.63 0.51  CALCIUM 8.9 8.7*   LFT Recent Labs    03/11/19 0441  PROT 7.1  ALBUMIN 4.3  AST 84*  ALT 62*  ALKPHOS 47  BILITOT 1.0    Studies/Results: Ct Abdomen Pelvis W Contrast  Result Date: 03/10/2019 CLINICAL DATA:  Abdominal pain with vomiting. History of ulcers. History of hepatitis-C. EXAM: CT ABDOMEN AND PELVIS WITH CONTRAST TECHNIQUE: Multidetector CT imaging of the abdomen and pelvis was performed using the standard protocol following bolus administration of intravenous contrast. CONTRAST:  150mL OMNIPAQUE IOHEXOL 300 MG/ML SOLN, 53mL OMNIPAQUE IOHEXOL 300 MG/ML SOLN COMPARISON:  CT 10/12/2013, 01/22/2014 FINDINGS: Lower chest: Lung bases are clear. Hepatobiliary: Low-attenuation liver suggests hepatic steatosis. Gallbladder normal. No biliary duct dilatation. Common bile duct is prominent but within normal limits at 5 mm through the pancreatic head. No obstructing lesion identified. Pancreas: There is inflammation involving the pancreatic head. Small amount of peripancreatic fluid. No pancreatic duct dilatation. The body and tail the pancreas are normal. No organized fluid collections. Spleen: Normal spleen Adrenals/urinary tract: Adrenal glands and kidneys are normal. The ureters and bladder normal. Stomach/Bowel: Large volume of oral contrast retained in the stomach. No evidence of obstructing lesion. Duodenum appears normal. Small bowel normal. Appendix normal. The colon and rectosigmoid colon are  normal. Vascular/Lymphatic: No vascular complication associated presumed pancreatitis. Mild atherosclerotic calcification of the aorta. Abdominopelvic lymphadenopathy. Reproductive: Uterus and adnexa normal. 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 Other: No free fluid. Musculoskeletal: Posterior lumbar fusion. No aggressive osseous lesions. IMPRESSION: 1. Focal acute pancreatitis involving the uncinate process and head of the pancreas. 2. Prominence of the common bile duct without obstructing lesion. 3. Hepatic steatosis. 4. No cholelithiasis identified. 5. Moderate volume of oral contrast retained in the stomach. No obstructing lesion identified. Electronically Signed   By: Suzy Bouchard M.D.   On: 03/10/2019 14:29   Mr 3d Recon At Scanner  Result Date: 03/11/2019 CLINICAL DATA:  Inpatient. Acute pancreatitis, first episode, etiology unknown, 2-3 days out. Abnormal liver function tests. EXAM: MRI ABDOMEN WITHOUT AND WITH CONTRAST (INCLUDING MRCP) TECHNIQUE: Multiplanar multisequence MR imaging of the abdomen was performed both before and after the administration of intravenous contrast. Heavily T2-weighted images of the biliary and  pancreatic ducts were obtained, and three-dimensional MRCP images were rendered by post processing. CONTRAST:  6 cc Gadavist IV. COMPARISON:  03/10/2019 CT abdomen/pelvis and abdominal sonogram. FINDINGS: Motion degraded scan, limiting assessment. Lower chest: No acute abnormality at the lung bases. Hepatobiliary: Normal liver size. No definite liver surface irregularity. Moderate diffuse hepatic steatosis. No liver mass. Sludge filled nondistended gallbladder with no cholelithiasis. No gallbladder wall thickening or pericholecystic fluid. No significant intrahepatic biliary ductal dilatation. Dilated common bile duct with 8 mm diameter with smooth distal tapering. No biliary filling defects to suggest choledocholithiasis. No biliary strictures or masses. Pancreas: There  is pancreatic parenchymal edema in the pancreatic head and neck with minimal associated peripancreatic edema, compatible with acute pancreatitis, improved compared to the CT study from 1 day prior. There is preserved pancreatic parenchymal enhancement. No pancreatic mass or duct dilation. No peripancreatic fluid collections. No pancreas divisum. Spleen: Normal size. No mass. Adrenals/Urinary Tract: Normal adrenals. No hydronephrosis. Normal kidneys with no renal mass. Stomach/Bowel: Normal non-distended stomach. Visualized small and large bowel is normal caliber, with no bowel wall thickening. Vascular/Lymphatic: Normal caliber abdominal aorta. Patent portal, splenic, hepatic and renal veins. No pathologically enlarged lymph nodes in the abdomen. Other: No abdominal ascites or focal fluid collection. Musculoskeletal: No aggressive appearing focal osseous lesions. Bilateral posterior spinal fusion in the lower lumbar spine. IMPRESSION: 1. Acute non-necrotizing pancreatitis involving the pancreatic head and neck, improved compared to the CT study from 1 day prior. No peripancreatic fluid collections. No pancreatic mass. 2. Mildly dilated common bile duct (8 mm diameter). No significant intrahepatic biliary ductal dilatation. No evidence of choledocholithiasis. No biliary strictures or masses. 3. Sludge filled and otherwise normal gallbladder with no cholelithiasis. 4. Moderate diffuse hepatic steatosis. No morphologic changes of hepatic cirrhosis. No liver masses. Electronically Signed   By: Ilona Sorrel M.D.   On: 03/11/2019 15:33   Mr Abdomen Mrcp Moise Boring Contast  Result Date: 03/11/2019 CLINICAL DATA:  Inpatient. Acute pancreatitis, first episode, etiology unknown, 2-3 days out. Abnormal liver function tests. EXAM: MRI ABDOMEN WITHOUT AND WITH CONTRAST (INCLUDING MRCP) TECHNIQUE: Multiplanar multisequence MR imaging of the abdomen was performed both before and after the administration of intravenous contrast.  Heavily T2-weighted images of the biliary and pancreatic ducts were obtained, and three-dimensional MRCP images were rendered by post processing. CONTRAST:  6 cc Gadavist IV. COMPARISON:  03/10/2019 CT abdomen/pelvis and abdominal sonogram. FINDINGS: Motion degraded scan, limiting assessment. Lower chest: No acute abnormality at the lung bases. Hepatobiliary: Normal liver size. No definite liver surface irregularity. Moderate diffuse hepatic steatosis. No liver mass. Sludge filled nondistended gallbladder with no cholelithiasis. No gallbladder wall thickening or pericholecystic fluid. No significant intrahepatic biliary ductal dilatation. Dilated common bile duct with 8 mm diameter with smooth distal tapering. No biliary filling defects to suggest choledocholithiasis. No biliary strictures or masses. Pancreas: There is pancreatic parenchymal edema in the pancreatic head and neck with minimal associated peripancreatic edema, compatible with acute pancreatitis, improved compared to the CT study from 1 day prior. There is preserved pancreatic parenchymal enhancement. No pancreatic mass or duct dilation. No peripancreatic fluid collections. No pancreas divisum. Spleen: Normal size. No mass. Adrenals/Urinary Tract: Normal adrenals. No hydronephrosis. Normal kidneys with no renal mass. Stomach/Bowel: Normal non-distended stomach. Visualized small and large bowel is normal caliber, with no bowel wall thickening. Vascular/Lymphatic: Normal caliber abdominal aorta. Patent portal, splenic, hepatic and renal veins. No pathologically enlarged lymph nodes in the abdomen. Other: No abdominal ascites or focal fluid collection.  Musculoskeletal: No aggressive appearing focal osseous lesions. Bilateral posterior spinal fusion in the lower lumbar spine. IMPRESSION: 1. Acute non-necrotizing pancreatitis involving the pancreatic head and neck, improved compared to the CT study from 1 day prior. No peripancreatic fluid collections. No  pancreatic mass. 2. Mildly dilated common bile duct (8 mm diameter). No significant intrahepatic biliary ductal dilatation. No evidence of choledocholithiasis. No biliary strictures or masses. 3. Sludge filled and otherwise normal gallbladder with no cholelithiasis. 4. Moderate diffuse hepatic steatosis. No morphologic changes of hepatic cirrhosis. No liver masses. Electronically Signed   By: Ilona Sorrel M.D.   On: 03/11/2019 15:33   US Abdomen Limited Ruq  Result Date: 03/10/2019 CLINICAL DATA:  Epigastric pain for 6 hours, ETOH. EXAM: ULTRASOUND ABDOMEN LIMITED RIGHT UPPER QUADRANT COMPARISON:  None. FINDINGS: Gallbladder: Gallbladder is filled with sludge. Associated gallbladder distension, moderate in degree. No gallbladder wall thickening or pericholecystic fluid seen. No sonographic Murphy's sign elicited during the exam. Common bile duct: Diameter: 8 mm Liver: Liver is diffusely echogenic indicating fatty infiltration. No focal liver abnormality. Portal vein is patent on color Doppler imaging with normal direction of blood flow towards the liver. Other: None. IMPRESSION: 1. Common bile duct is distended, measuring 8 mm. This may be due to obstructing stone in the distal common bile duct. Recommend correlation with LFTs and would consider further characterization with ERCP or MRCP. 2. Gallbladder is filled with sludge. No gallbladder wall thickening, sonographic Murphy's sign or other secondary signs of acute cholecystitis. 3. Fatty infiltration of the liver. Electronically Signed   By: Franki Cabot M.D.   On: 03/10/2019 12:37   Current imaging studies reviewed.  CBD is dilated on all 3 modalities but no evidence of choledocholithiasis.  Ultrasound from 12/07/2013 revealed CBD of 4.8 mm.  Assessment;  Patient is 42 year old Caucasian female who presents with sudden onset of excruciating epigastric pain associated with nausea vomiting and scant hematemesis who was found to have elevated serum  lipase elevated transaminases and ultrasound revealing sludge and bile duct measuring 8 mm without stones and fatty liver.  Abdominal pelvic CT and MRCP revealed changes of nonnecrotizing pancreatitis involving the head of pancreas but no evidence of cholelithiasis or choledocholithiasis.  Patient is feeling better with supportive therapy.  Suspect her acute illness is secondary to excessive alcohol intake rather than biliary pancreatitis. Suspect she has acute alcoholic pancreatitis as well as alcoholic hepatitis.  Common bile duct is mildly dilated and will need to be reevaluated with an ultrasound in few months. We will consider stopping antibiotic after evaluation in a.m.  Patient experience scant hematemesis which would appear to be due to Mallory-Weiss tear given that she had multiple episodes of vomiting/heaving.  She has a history of gastric ulcer back in 2015 and her H. pylori serology was negative.  No indication for EGD at this time.  History of hepatitis C for which she was treated while she was in jail.  Transaminases 4 months ago were normal.  HCVRNA will be checked.  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 Glo Herring in the past.  Recommendations;  LFTs and serum lipase in a.m. Patient counseled that she must not go back to drinking alcohol as she is at risk for chronic pancreatitis as well as cirrhosis. HCVRNA with a.m. lab. Repeat abdominal ultrasound in 3 months.   LOS: 1 day   Najeeb Rehman  03/11/2019, 5:01 PM

## 2019-03-12 DIAGNOSIS — F1010 Alcohol abuse, uncomplicated: Secondary | ICD-10-CM

## 2019-03-12 DIAGNOSIS — K8500 Idiopathic acute pancreatitis without necrosis or infection: Secondary | ICD-10-CM

## 2019-03-12 DIAGNOSIS — R740 Nonspecific elevation of levels of transaminase and lactic acid dehydrogenase [LDH]: Secondary | ICD-10-CM

## 2019-03-12 DIAGNOSIS — F1210 Cannabis abuse, uncomplicated: Secondary | ICD-10-CM

## 2019-03-12 LAB — COMPREHENSIVE METABOLIC PANEL
ALT: 48 U/L — ABNORMAL HIGH (ref 0–44)
AST: 52 U/L — ABNORMAL HIGH (ref 15–41)
Albumin: 3.9 g/dL (ref 3.5–5.0)
Alkaline Phosphatase: 43 U/L (ref 38–126)
Anion gap: 9 (ref 5–15)
BUN: 5 mg/dL — ABNORMAL LOW (ref 6–20)
CO2: 23 mmol/L (ref 22–32)
Calcium: 8.6 mg/dL — ABNORMAL LOW (ref 8.9–10.3)
Chloride: 104 mmol/L (ref 98–111)
Creatinine, Ser: 0.43 mg/dL — ABNORMAL LOW (ref 0.44–1.00)
GFR calc Af Amer: >60 mL/min (ref >60)
GFR calc non Af Amer: >60 mL/min (ref >60)
Glucose, Bld: 84 mg/dL (ref 70–99)
Potassium: 3.6 mmol/L (ref 3.5–5.1)
Sodium: 136 mmol/L (ref 135–145)
Total Bilirubin: 1.1 mg/dL (ref 0.3–1.2)
Total Protein: 6.8 g/dL (ref 6.5–8.1)

## 2019-03-12 LAB — CBC
HCT: 41.4 % (ref 36.0–46.0)
Hemoglobin: 13.7 g/dL (ref 12.0–15.0)
MCH: 32.8 pg (ref 26.0–34.0)
MCHC: 33.1 g/dL (ref 30.0–36.0)
MCV: 99 fL (ref 80.0–100.0)
Platelets: 130 10*3/uL — ABNORMAL LOW (ref 150–400)
RBC: 4.18 MIL/uL (ref 3.87–5.11)
RDW: 13 % (ref 11.5–15.5)
WBC: 8.3 10*3/uL (ref 4.0–10.5)
nRBC: 0 % (ref 0.0–0.2)

## 2019-03-12 LAB — LIPID PANEL
Cholesterol: 178 mg/dL (ref 0–200)
HDL: 87 mg/dL (ref >40)
LDL Cholesterol: 75 mg/dL (ref 0–99)
Total CHOL/HDL Ratio: 2 RATIO
Triglycerides: 79 mg/dL (ref <150)
VLDL: 16 mg/dL (ref 0–40)

## 2019-03-12 LAB — LIPASE, BLOOD: Lipase: 181 U/L — ABNORMAL HIGH (ref 11–51)

## 2019-03-12 NOTE — Progress Notes (Signed)
Patient insistent that she leaves Against Medical Advice [facility]). Patient stated "I have an emergency and I need to leave now" Risks of leaving AMA have been discussed with patient. MD has been verbally notified. IV catheter has been removed and is intact. Site clean dry and intact.

## 2019-03-12 NOTE — Progress Notes (Signed)
PROGRESS NOTE  Olivia Lester XHB:[id number] DOB: 1976/02/01 DOA: 03/10/2019 PCP: Soyla Dryer, PA-C   Brief History:  43 year old female with a history of alcohol abuse, hepatitis C, anxiety, chronic back pain presenting with nausea, vomiting, abdominal pain that began on 03/09/2019.  The patient states that she vomited innumerable times with occasional blood noted on 03/10/2019.  She denied any fevers, chills, chest pain, shortness breath, headache, neck pain.  She drinks alcohol on a daily basis.  She states that she drinks at least 8 bottles of beer and numerous shots of tequila.  The patient is also been smoking 1 pack/day and using marijuana.  She denies any diarrhea, hematochezia, melena, dysuria, hematuria. In the emergency department, the patient was afebrile hemodynamically stable with oxygen saturation 1% room air.  WBC was 18.6.  AST 149, ALT 85, alkaline phosphatase 50, total bilirubin 0.7, lipase 379.  Alcohol level 59.  Urine drug screen positive for opiates and THC.  CT of the abdomen and pelvis showed hepatic steatosis with prominent common bile duct of 5 mm.  There was inflammation in the pancreatic head with a small amount of peripancreatic fluid.  On the am 03/12/19, pt wanted to leave AMA.  After discussion with patient, she decided to stay.  Assessment/Plan: Acute Alcoholic Pancreatitis -continue clears for now -she one episode of emesis in last 24 hours -continue IVF -judicious IV morphine -RUQ US--Common bile duct is distended, measuring 8 mm.  -MRCP--Acute non-necrotizing pancreatitis involving the pancreatic head.  Mildly dilated common bile duct (8 mm diameter).   and neck, improved compared to the CT study from 1 day prior. No choledocholithiasis. No biliary strictures or masses -lipid panel--triglycerides 79 -Zofran around-the-clock -phenergan prn intractable n/v -pain out of proportion with objective findings -pt walking around nursing unit  multiple times without discomfort -case discussed with Dr. Laural Golden  Hematemesis -Mallory-Weiss tear vs Etoh gastritis/esophagitis -Continue Protonix bid  Polysubstance abuse -Including tobacco, alcohol, opioids -PMP Aware queried--last opioid prescription for Percocet on 09/06/2018  Tobacco abuse -NicoDerm patch  Alcohol abuse -Alcohol withdrawal protocol       Disposition Plan:   Home in 1-2 days  Family Communication:   No Family at bedside  Consultants:    Code Status:  FULL   DVT Prophylaxis:   [place] Lovenox   Procedures: As Listed in Progress Note Above  Antibiotics: None   Subjective: Pt was unhappy with care and wanted to leave AMA.  After I spoke with her, she decided to stay.  She states abd pain is a little better.  Denies f/c cp, sob, diarrhea.  She had one episode of emesis overnight.    Objective: Vitals:   03/11/19 1406 03/11/19 2009 03/11/19 2127 03/12/19 0551  BP: (!) 163/105  (!) 154/113 (!) 154/105  Pulse: 79  79 73  Resp: 19  18 16   Temp:    98.5 F (36.9 C)  TempSrc:    Oral  SpO2: 100% 98% 99% 100%  Weight:      Height:        Intake/Output Summary (Last 24 hours) at 03/12/2019 0746 Last data filed at 03/12/2019 0556 Gross per 24 hour  Intake 2666.51 ml  Output --  Net 2666.51 ml   Weight change:  Exam:   General:  Pt is alert, follows commands appropriately, not in acute distress  HEENT: No icterus, No thrush, No neck mass, Ernest/AT  Cardiovascular: RRR, S1/S2, no rubs, no gallops  Respiratory: diminished BS, butCTA bilaterally, no wheezing, no crackles, no rhonchi  Abdomen: Soft/+BS, epigastric tender, non distended, no guarding  Extremities: No edema, No lymphangitis, No petechiae, No rashes, no synovitis   Data Reviewed: I have personally reviewed following labs and imaging studies Basic Metabolic Panel: Recent Labs  Lab 03/10/19 0936 03/11/19 0441 03/12/19 0444  NA 142 136 136  K 3.1* 3.8 3.6   CL 97* 99 104  CO2 22 28 23   GLUCOSE 88 94 84  BUN 11 7 5*  CREATININE 0.63 0.51 0.43*  CALCIUM 8.9 8.7* 8.6*   Liver Function Tests: Recent Labs  Lab 03/10/19 0936 03/11/19 0441 03/12/19 0444  AST 149* 84* 52*  ALT 85* 62* 48*  ALKPHOS 50 47 43  BILITOT 0.7 1.0 1.1  PROT 8.1 7.1 6.8  ALBUMIN 5.1* 4.3 3.9   Recent Labs  Lab 03/10/19 0936  LIPASE 379*   No results for input(s): AMMONIA in the last 168 hours. Coagulation Profile: No results for input(s): INR, PROTIME in the last 168 hours. CBC: Recent Labs  Lab 03/10/19 0936 03/11/19 0441 03/12/19 0444  WBC 18.6* 12.8* 8.3  NEUTROABS 15.3*  --   --   HGB 14.6 14.2 13.7  HCT 44.9 42.7 41.4  MCV 98.2 98.2 99.0  PLT 248 153 130*   Cardiac Enzymes: No results for input(s): CKTOTAL, CKMB, CKMBINDEX, TROPONINI in the last 168 hours. BNP: Invalid input(s): POCBNP CBG: No results for input(s): GLUCAP in the last 168 hours. HbA1C: No results for input(s): HGBA1C in the last 72 hours. Urine analysis:    Component Value Date/Time   COLORURINE YELLOW 03/10/2019 1054   APPEARANCEUR CLEAR 03/10/2019 1054   APPEARANCEUR Clear 12/07/2013 0133   LABSPEC 1.012 03/10/2019 1054   LABSPEC 1.006 12/07/2013 0133   PHURINE 6.0 03/10/2019 1054   GLUCOSEU NEGATIVE 03/10/2019 1054   GLUCOSEU Negative 12/07/2013 0133   HGBUR NEGATIVE 03/10/2019 1054   BILIRUBINUR NEGATIVE 03/10/2019 1054   BILIRUBINUR Negative 12/07/2013 0133   KETONESUR 20 (A) 03/10/2019 1054   PROTEINUR NEGATIVE 03/10/2019 1054   UROBILINOGEN 0.2 04/28/2015 1559   NITRITE NEGATIVE 03/10/2019 1054   LEUKOCYTESUR NEGATIVE 03/10/2019 1054   LEUKOCYTESUR Negative 12/07/2013 0133   Sepsis Labs: @LABRCNTIP (procalcitonin:4,lacticidven:4) ) Recent Results (from the past 240 hour(s))  SARS Coronavirus 2 [facility] order, Performed in [facility] hospital lab) Nasopharyngeal Nasopharyngeal Swab     Status: None   Collection Time: 03/10/19  3:37 PM   Specimen:  Nasopharyngeal Swab  Result Value Ref Range Status   SARS Coronavirus 2 NEGATIVE NEGATIVE Final    Comment: (NOTE) If result is NEGATIVE SARS-CoV-2 target nucleic acids are NOT DETECTED. The SARS-CoV-2 RNA is generally detectable in upper and lower  respiratory specimens during the acute phase of infection. The lowest  concentration of SARS-CoV-2 viral copies this assay can detect is 250  copies / mL. A negative result does not preclude SARS-CoV-2 infection  and should not be used as the sole basis for treatment or other  patient management decisions.  A negative result may occur with  improper specimen collection / handling, submission of specimen other  than nasopharyngeal swab, presence of viral mutation(s) within the  areas targeted by this assay, and inadequate number of viral copies  (<250 copies / mL). A negative result must be combined with clinical  observations, patient history, and epidemiological information. If result is POSITIVE SARS-CoV-2 target nucleic acids are DETECTED. The SARS-CoV-2 RNA is generally detectable in upper and lower  respiratory specimens dur ing the acute phase of infection.  Positive  results are indicative of active infection with SARS-CoV-2.  Clinical  correlation with patient history and other diagnostic information is  necessary to determine patient infection status.  Positive results do  not rule out bacterial infection or co-infection with other viruses. If result is PRESUMPTIVE POSTIVE SARS-CoV-2 nucleic acids MAY BE PRESENT.   A presumptive positive result was obtained on the submitted specimen  and confirmed on repeat testing.  While 2019 novel coronavirus  (SARS-CoV-2) nucleic acids may be present in the submitted sample  additional confirmatory testing may be necessary for epidemiological  and / or clinical management purposes  to differentiate between  SARS-CoV-2 and other Sarbecovirus currently known to infect humans.  If clinically  indicated additional testing with an alternate test  methodology [phone]) is advised. The SARS-CoV-2 RNA is generally  detectable in upper and lower respiratory sp ecimens during the acute  phase of infection. The expected result is Negative. Fact Sheet for Patients:  StrictlyIdeas.no Fact Sheet for Healthcare Providers: BankingDealers.co.za This test is not yet approved or cleared by the [place] FDA and has been authorized for detection and/or diagnosis of SARS-CoV-2 by FDA under an Emergency Use Authorization (EUA).  This EUA will remain in effect (meaning this test can be used) for the duration of the COVID-19 declaration under Section 564(b)(1) of the Act, 21 U.S.C. section 360bbb-3(b)(1), unless the authorization is terminated or revoked sooner. Performed at [facility], [address], [place], [place] [address]      Scheduled Meds:  chlorhexidine  15 mL Mouth Rinse BID   folic acid  1 mg Oral Daily   mouth rinse  15 mL Mouth Rinse q12n4p   multivitamin with minerals  1 tablet Oral Daily   nicotine  21 mg Transdermal Daily   ondansetron (ZOFRAN) IV  4 mg Intravenous Q6H   pantoprazole (PROTONIX) IV  40 mg Intravenous Q12H   thiamine  100 mg Oral Daily   Or   thiamine  100 mg Intravenous Daily   Continuous Infusions:  0.9 % NaCl with KCl 20 mEq / L 125 mL/hr at 03/12/19 0434    Procedures/Studies: Ct Abdomen Pelvis W Contrast  Result Date: 03/10/2019 CLINICAL DATA:  Abdominal pain with vomiting. History of ulcers. History of hepatitis-C. EXAM: CT ABDOMEN AND PELVIS WITH CONTRAST TECHNIQUE: Multidetector CT imaging of the abdomen and pelvis was performed using the standard protocol following bolus administration of intravenous contrast. CONTRAST:  149mL OMNIPAQUE IOHEXOL 300 MG/ML SOLN, 74mL OMNIPAQUE IOHEXOL 300 MG/ML SOLN COMPARISON:  CT 10/12/2013, 01/22/2014 FINDINGS: Lower chest: Lung bases are clear.  Hepatobiliary: Low-attenuation liver suggests hepatic steatosis. Gallbladder normal. No biliary duct dilatation. Common bile duct is prominent but within normal limits at 5 mm through the pancreatic head. No obstructing lesion identified. Pancreas: There is inflammation involving the pancreatic head. Small amount of peripancreatic fluid. No pancreatic duct dilatation. The body and tail the pancreas are normal. No organized fluid collections. Spleen: Normal spleen Adrenals/urinary tract: Adrenal glands and kidneys are normal. The ureters and bladder normal. Stomach/Bowel: Large volume of oral contrast retained in the stomach. No evidence of obstructing lesion. Duodenum appears normal. Small bowel normal. Appendix normal. The colon and rectosigmoid colon are normal. Vascular/Lymphatic: No vascular complication associated presumed pancreatitis. Mild atherosclerotic calcification of the aorta. Abdominopelvic lymphadenopathy. Reproductive: Uterus and adnexa normal. 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 Other: No free fluid. Musculoskeletal: Posterior lumbar fusion. No aggressive  osseous lesions. IMPRESSION: 1. Focal acute pancreatitis involving the uncinate process and head of the pancreas. 2. Prominence of the common bile duct without obstructing lesion. 3. Hepatic steatosis. 4. No cholelithiasis identified. 5. Moderate volume of oral contrast retained in the stomach. No obstructing lesion identified. Electronically Signed   By: Suzy Bouchard M.D.   On: 03/10/2019 14:29   Mr 3d Recon At Scanner  Result Date: 03/11/2019 CLINICAL DATA:  Inpatient. Acute pancreatitis, first episode, etiology unknown, 2-3 days out. Abnormal liver function tests. EXAM: MRI ABDOMEN WITHOUT AND WITH CONTRAST (INCLUDING MRCP) TECHNIQUE: Multiplanar multisequence MR imaging of the abdomen was performed both before and after the administration of intravenous contrast. Heavily T2-weighted images of the biliary and pancreatic ducts  were obtained, and three-dimensional MRCP images were rendered by post processing. CONTRAST:  6 cc Gadavist IV. COMPARISON:  03/10/2019 CT abdomen/pelvis and abdominal sonogram. FINDINGS: Motion degraded scan, limiting assessment. Lower chest: No acute abnormality at the lung bases. Hepatobiliary: Normal liver size. No definite liver surface irregularity. Moderate diffuse hepatic steatosis. No liver mass. Sludge filled nondistended gallbladder with no cholelithiasis. No gallbladder wall thickening or pericholecystic fluid. No significant intrahepatic biliary ductal dilatation. Dilated common bile duct with 8 mm diameter with smooth distal tapering. No biliary filling defects to suggest choledocholithiasis. No biliary strictures or masses. Pancreas: There is pancreatic parenchymal edema in the pancreatic head and neck with minimal associated peripancreatic edema, compatible with acute pancreatitis, improved compared to the CT study from 1 day prior. There is preserved pancreatic parenchymal enhancement. No pancreatic mass or duct dilation. No peripancreatic fluid collections. No pancreas divisum. Spleen: Normal size. No mass. Adrenals/Urinary Tract: Normal adrenals. No hydronephrosis. Normal kidneys with no renal mass. Stomach/Bowel: Normal non-distended stomach. Visualized small and large bowel is normal caliber, with no bowel wall thickening. Vascular/Lymphatic: Normal caliber abdominal aorta. Patent portal, splenic, hepatic and renal veins. No pathologically enlarged lymph nodes in the abdomen. Other: No abdominal ascites or focal fluid collection. Musculoskeletal: No aggressive appearing focal osseous lesions. Bilateral posterior spinal fusion in the lower lumbar spine. IMPRESSION: 1. Acute non-necrotizing pancreatitis involving the pancreatic head and neck, improved compared to the CT study from 1 day prior. No peripancreatic fluid collections. No pancreatic mass. 2. Mildly dilated common bile duct (8 mm  diameter). No significant intrahepatic biliary ductal dilatation. No evidence of choledocholithiasis. No biliary strictures or masses. 3. Sludge filled and otherwise normal gallbladder with no cholelithiasis. 4. Moderate diffuse hepatic steatosis. No morphologic changes of hepatic cirrhosis. No liver masses. Electronically Signed   By: Ilona Sorrel M.D.   On: 03/11/2019 15:33   Mr Abdomen Mrcp Moise Boring Contast  Result Date: 03/11/2019 CLINICAL DATA:  Inpatient. Acute pancreatitis, first episode, etiology unknown, 2-3 days out. Abnormal liver function tests. EXAM: MRI ABDOMEN WITHOUT AND WITH CONTRAST (INCLUDING MRCP) TECHNIQUE: Multiplanar multisequence MR imaging of the abdomen was performed both before and after the administration of intravenous contrast. Heavily T2-weighted images of the biliary and pancreatic ducts were obtained, and three-dimensional MRCP images were rendered by post processing. CONTRAST:  6 cc Gadavist IV. COMPARISON:  03/10/2019 CT abdomen/pelvis and abdominal sonogram. FINDINGS: Motion degraded scan, limiting assessment. Lower chest: No acute abnormality at the lung bases. Hepatobiliary: Normal liver size. No definite liver surface irregularity. Moderate diffuse hepatic steatosis. No liver mass. Sludge filled nondistended gallbladder with no cholelithiasis. No gallbladder wall thickening or pericholecystic fluid. No significant intrahepatic biliary ductal dilatation. Dilated common bile duct with 8 mm diameter with smooth distal tapering.  No biliary filling defects to suggest choledocholithiasis. No biliary strictures or masses. Pancreas: There is pancreatic parenchymal edema in the pancreatic head and neck with minimal associated peripancreatic edema, compatible with acute pancreatitis, improved compared to the CT study from 1 day prior. There is preserved pancreatic parenchymal enhancement. No pancreatic mass or duct dilation. No peripancreatic fluid collections. No pancreas divisum.  Spleen: Normal size. No mass. Adrenals/Urinary Tract: Normal adrenals. No hydronephrosis. Normal kidneys with no renal mass. Stomach/Bowel: Normal non-distended stomach. Visualized small and large bowel is normal caliber, with no bowel wall thickening. Vascular/Lymphatic: Normal caliber abdominal aorta. Patent portal, splenic, hepatic and renal veins. No pathologically enlarged lymph nodes in the abdomen. Other: No abdominal ascites or focal fluid collection. Musculoskeletal: No aggressive appearing focal osseous lesions. Bilateral posterior spinal fusion in the lower lumbar spine. IMPRESSION: 1. Acute non-necrotizing pancreatitis involving the pancreatic head and neck, improved compared to the CT study from 1 day prior. No peripancreatic fluid collections. No pancreatic mass. 2. Mildly dilated common bile duct (8 mm diameter). No significant intrahepatic biliary ductal dilatation. No evidence of choledocholithiasis. No biliary strictures or masses. 3. Sludge filled and otherwise normal gallbladder with no cholelithiasis. 4. Moderate diffuse hepatic steatosis. No morphologic changes of hepatic cirrhosis. No liver masses. Electronically Signed   By: Ilona Sorrel M.D.   On: 03/11/2019 15:33   US Abdomen Limited Ruq  Result Date: 03/10/2019 CLINICAL DATA:  Epigastric pain for 6 hours, ETOH. EXAM: ULTRASOUND ABDOMEN LIMITED RIGHT UPPER QUADRANT COMPARISON:  None. FINDINGS: Gallbladder: Gallbladder is filled with sludge. Associated gallbladder distension, moderate in degree. No gallbladder wall thickening or pericholecystic fluid seen. No sonographic Murphy's sign elicited during the exam. Common bile duct: Diameter: 8 mm Liver: Liver is diffusely echogenic indicating fatty infiltration. No focal liver abnormality. Portal vein is patent on color Doppler imaging with normal direction of blood flow towards the liver. Other: None. IMPRESSION: 1. Common bile duct is distended, measuring 8 mm. This may be due to  obstructing stone in the distal common bile duct. Recommend correlation with LFTs and would consider further characterization with ERCP or MRCP. 2. Gallbladder is filled with sludge. No gallbladder wall thickening, sonographic Murphy's sign or other secondary signs of acute cholecystitis. 3. Fatty infiltration of the liver. Electronically Signed   By: Franki Cabot M.D.   On: 03/10/2019 12:37    Orson Eva, DO  [facility] Pager [phone]  If 7PM-7AM, please contact night-coverage [email or web address] Password [facility] 03/12/2019, 7:46 AM   LOS: 2 days

## 2019-03-12 NOTE — Discharge Summary (Signed)
Physician Discharge Summary  Olivia Lester BWG:[id number] DOB: 1976-03-22 DOA: 03/10/2019  PCP: Soyla Dryer, PA-C  Admit date: 03/10/2019 Discharge date: 03/12/2019  Admitted From: Home Disposition:  [place]     Discharge Condition: AGAINST MEDICAL ADVICE     Brief/Interim Summary: 43 year old female with a history of alcohol abuse, hepatitis C,anxiety, chronic back painpresenting with nausea, vomiting, abdominal pain that began on 03/09/2019. The patient states that she vomited innumerable times with occasional blood noted on 03/10/2019. She denied any fevers, chills, chest pain, shortness breath, headache, neck pain. She drinks alcohol on a daily basis. She states that she drinks at least 8 bottles of beer and numerous shots of tequila. The patient is also been smoking 1 pack/day and using marijuana. She denies any diarrhea, hematochezia, melena, dysuria, hematuria. In the emergency department, the patient was afebrile hemodynamically stable with oxygen saturation 1% room air. WBC was 18.6. AST 149, ALT 85, alkaline phosphatase 50, total bilirubin 0.7, lipase 379. Alcohol level 59. Urine drug screen positive for opiates and THC. CT of the abdomen and pelvis showed hepatic steatosis with prominent common bile duct of 5 mm. There was inflammation in the pancreatic head with a small amount of peripancreatic fluid.  On the am 03/12/19, pt wanted to leave AMA.  After discussion with patient, she decided to stay. However, in the early afternoon, the patient stated she had a social family issue for which she wanted to leave the hospital AMA.   In speaking with Naviyah Redmond Pulling, Sherell Martasia Talamante has demonstrated the ability to understand his medical condition(s) which include acute pancreatitis, intractable vomiting.  Cariann Londin Antone has demonstrated the ability to appreciate how treatment for acute pancreatitis will be beneficial.   Reema Wilhemina Grall has also  demonstrated the ability to understand and appreciate how refusal of treatement for acute pancreatitis could result in harm, repeat hospitalization, and possibly death.  Kerryann Evalina Tabak demonstrates the ability to reason through the risks and benefits of the proposed treatment.  Finally, Braniyah Vonnie Spagnolo is able to clearly communicate his/her choice.   Discharge Diagnoses:  Acute Alcoholic Pancreatitis -continued clears for now -continued IVF -judicious IV morphine -RUQ US--Common bile duct is distended, measuring 8 mm. -MRCP--Acute non-necrotizing pancreatitis involving the pancreatic head.  Mildly dilated common bile duct (8 mm diameter).   and neck, improved compared to the CT study from 1 day prior. No choledocholithiasis. No biliary strictures or masses -lipid panel--triglycerides 79 -Zofran around-the-clock -phenergan prn intractable n/v -pain out of proportion with objective findings -case discussed with Dr. Laural Golden  Hematemesis -Mallory-Weiss tear vs Etoh gastritis/esophagitis -Continue Protonix bid -no further hematemesis during hospitalization  Polysubstance abuse -Including tobacco, alcohol, opioids -PMP Aware queried--last opioid prescription for Percocet on 09/06/2018  Tobacco abuse -NicoDerm patch  Alcohol abuse -Alcohol withdrawal protocol    Discharge Instructions   Allergies as of 03/12/2019   No Known Allergies     Medication List    ASK your doctor about these medications   clindamycin 150 MG capsule Commonly known as: CLEOCIN 3 tabs PO TID x 10 days   ibuprofen 800 MG tablet Commonly known as: ADVIL Take 800 mg by mouth every 8 (eight) hours as needed for moderate pain.   naproxen 250 MG tablet Commonly known as: NAPROSYN Take 1 tablet (250 mg total) by mouth 2 (two) times daily as needed for mild pain or moderate pain (take with food).   ondansetron 8 MG tablet Commonly known as: Zofran Take  1 tablet (8 mg total) by mouth every 4  (four) hours as needed.   oxyCODONE-acetaminophen 5-325 MG tablet Commonly known as: PERCOCET/ROXICET Take 1 tablet by mouth every 8 (eight) hours as needed.   pantoprazole 40 MG tablet Commonly known as: PROTONIX Take 40 mg by mouth 2 (two) times daily.       No Known Allergies  Consultations:  GI   Procedures/Studies: Ct Abdomen Pelvis W Contrast  Result Date: 03/10/2019 CLINICAL DATA:  Abdominal pain with vomiting. History of ulcers. History of hepatitis-C. EXAM: CT ABDOMEN AND PELVIS WITH CONTRAST TECHNIQUE: Multidetector CT imaging of the abdomen and pelvis was performed using the standard protocol following bolus administration of intravenous contrast. CONTRAST:  121mL OMNIPAQUE IOHEXOL 300 MG/ML SOLN, 66mL OMNIPAQUE IOHEXOL 300 MG/ML SOLN COMPARISON:  CT 10/12/2013, 01/22/2014 FINDINGS: Lower chest: Lung bases are clear. Hepatobiliary: Low-attenuation liver suggests hepatic steatosis. Gallbladder normal. No biliary duct dilatation. Common bile duct is prominent but within normal limits at 5 mm through the pancreatic head. No obstructing lesion identified. Pancreas: There is inflammation involving the pancreatic head. Small amount of peripancreatic fluid. No pancreatic duct dilatation. The body and tail the pancreas are normal. No organized fluid collections. Spleen: Normal spleen Adrenals/urinary tract: Adrenal glands and kidneys are normal. The ureters and bladder normal. Stomach/Bowel: Large volume of oral contrast retained in the stomach. No evidence of obstructing lesion. Duodenum appears normal. Small bowel normal. Appendix normal. The colon and rectosigmoid colon are normal. Vascular/Lymphatic: No vascular complication associated presumed pancreatitis. Mild atherosclerotic calcification of the aorta. Abdominopelvic lymphadenopathy. Reproductive: Uterus and adnexa normal. 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 Other: No free fluid. Musculoskeletal: Posterior lumbar fusion.  No aggressive osseous lesions. IMPRESSION: 1. Focal acute pancreatitis involving the uncinate process and head of the pancreas. 2. Prominence of the common bile duct without obstructing lesion. 3. Hepatic steatosis. 4. No cholelithiasis identified. 5. Moderate volume of oral contrast retained in the stomach. No obstructing lesion identified. Electronically Signed   By: Suzy Bouchard M.D.   On: 03/10/2019 14:29   Mr 3d Recon At Scanner  Result Date: 03/11/2019 CLINICAL DATA:  Inpatient. Acute pancreatitis, first episode, etiology unknown, 2-3 days out. Abnormal liver function tests. EXAM: MRI ABDOMEN WITHOUT AND WITH CONTRAST (INCLUDING MRCP) TECHNIQUE: Multiplanar multisequence MR imaging of the abdomen was performed both before and after the administration of intravenous contrast. Heavily T2-weighted images of the biliary and pancreatic ducts were obtained, and three-dimensional MRCP images were rendered by post processing. CONTRAST:  6 cc Gadavist IV. COMPARISON:  03/10/2019 CT abdomen/pelvis and abdominal sonogram. FINDINGS: Motion degraded scan, limiting assessment. Lower chest: No acute abnormality at the lung bases. Hepatobiliary: Normal liver size. No definite liver surface irregularity. Moderate diffuse hepatic steatosis. No liver mass. Sludge filled nondistended gallbladder with no cholelithiasis. No gallbladder wall thickening or pericholecystic fluid. No significant intrahepatic biliary ductal dilatation. Dilated common bile duct with 8 mm diameter with smooth distal tapering. No biliary filling defects to suggest choledocholithiasis. No biliary strictures or masses. Pancreas: There is pancreatic parenchymal edema in the pancreatic head and neck with minimal associated peripancreatic edema, compatible with acute pancreatitis, improved compared to the CT study from 1 day prior. There is preserved pancreatic parenchymal enhancement. No pancreatic mass or duct dilation. No peripancreatic fluid  collections. No pancreas divisum. Spleen: Normal size. No mass. Adrenals/Urinary Tract: Normal adrenals. No hydronephrosis. Normal kidneys with no renal mass. Stomach/Bowel: Normal non-distended stomach. Visualized small and large bowel is normal caliber, with  no bowel wall thickening. Vascular/Lymphatic: Normal caliber abdominal aorta. Patent portal, splenic, hepatic and renal veins. No pathologically enlarged lymph nodes in the abdomen. Other: No abdominal ascites or focal fluid collection. Musculoskeletal: No aggressive appearing focal osseous lesions. Bilateral posterior spinal fusion in the lower lumbar spine. IMPRESSION: 1. Acute non-necrotizing pancreatitis involving the pancreatic head and neck, improved compared to the CT study from 1 day prior. No peripancreatic fluid collections. No pancreatic mass. 2. Mildly dilated common bile duct (8 mm diameter). No significant intrahepatic biliary ductal dilatation. No evidence of choledocholithiasis. No biliary strictures or masses. 3. Sludge filled and otherwise normal gallbladder with no cholelithiasis. 4. Moderate diffuse hepatic steatosis. No morphologic changes of hepatic cirrhosis. No liver masses. Electronically Signed   By: Ilona Sorrel M.D.   On: 03/11/2019 15:33   Mr Abdomen Mrcp Moise Boring Contast  Result Date: 03/11/2019 CLINICAL DATA:  Inpatient. Acute pancreatitis, first episode, etiology unknown, 2-3 days out. Abnormal liver function tests. EXAM: MRI ABDOMEN WITHOUT AND WITH CONTRAST (INCLUDING MRCP) TECHNIQUE: Multiplanar multisequence MR imaging of the abdomen was performed both before and after the administration of intravenous contrast. Heavily T2-weighted images of the biliary and pancreatic ducts were obtained, and three-dimensional MRCP images were rendered by post processing. CONTRAST:  6 cc Gadavist IV. COMPARISON:  03/10/2019 CT abdomen/pelvis and abdominal sonogram. FINDINGS: Motion degraded scan, limiting assessment. Lower chest: No acute  abnormality at the lung bases. Hepatobiliary: Normal liver size. No definite liver surface irregularity. Moderate diffuse hepatic steatosis. No liver mass. Sludge filled nondistended gallbladder with no cholelithiasis. No gallbladder wall thickening or pericholecystic fluid. No significant intrahepatic biliary ductal dilatation. Dilated common bile duct with 8 mm diameter with smooth distal tapering. No biliary filling defects to suggest choledocholithiasis. No biliary strictures or masses. Pancreas: There is pancreatic parenchymal edema in the pancreatic head and neck with minimal associated peripancreatic edema, compatible with acute pancreatitis, improved compared to the CT study from 1 day prior. There is preserved pancreatic parenchymal enhancement. No pancreatic mass or duct dilation. No peripancreatic fluid collections. No pancreas divisum. Spleen: Normal size. No mass. Adrenals/Urinary Tract: Normal adrenals. No hydronephrosis. Normal kidneys with no renal mass. Stomach/Bowel: Normal non-distended stomach. Visualized small and large bowel is normal caliber, with no bowel wall thickening. Vascular/Lymphatic: Normal caliber abdominal aorta. Patent portal, splenic, hepatic and renal veins. No pathologically enlarged lymph nodes in the abdomen. Other: No abdominal ascites or focal fluid collection. Musculoskeletal: No aggressive appearing focal osseous lesions. Bilateral posterior spinal fusion in the lower lumbar spine. IMPRESSION: 1. Acute non-necrotizing pancreatitis involving the pancreatic head and neck, improved compared to the CT study from 1 day prior. No peripancreatic fluid collections. No pancreatic mass. 2. Mildly dilated common bile duct (8 mm diameter). No significant intrahepatic biliary ductal dilatation. No evidence of choledocholithiasis. No biliary strictures or masses. 3. Sludge filled and otherwise normal gallbladder with no cholelithiasis. 4. Moderate diffuse hepatic steatosis. No  morphologic changes of hepatic cirrhosis. No liver masses. Electronically Signed   By: Ilona Sorrel M.D.   On: 03/11/2019 15:33   US Abdomen Limited Ruq  Result Date: 03/10/2019 CLINICAL DATA:  Epigastric pain for 6 hours, ETOH. EXAM: ULTRASOUND ABDOMEN LIMITED RIGHT UPPER QUADRANT COMPARISON:  None. FINDINGS: Gallbladder: Gallbladder is filled with sludge. Associated gallbladder distension, moderate in degree. No gallbladder wall thickening or pericholecystic fluid seen. No sonographic Murphy's sign elicited during the exam. Common bile duct: Diameter: 8 mm Liver: Liver is diffusely echogenic indicating fatty infiltration. No focal liver abnormality.  Portal vein is patent on color Doppler imaging with normal direction of blood flow towards the liver. Other: None. IMPRESSION: 1. Common bile duct is distended, measuring 8 mm. This may be due to obstructing stone in the distal common bile duct. Recommend correlation with LFTs and would consider further characterization with ERCP or MRCP. 2. Gallbladder is filled with sludge. No gallbladder wall thickening, sonographic Murphy's sign or other secondary signs of acute cholecystitis. 3. Fatty infiltration of the liver. Electronically Signed   By: Franki Cabot M.D.   On: 03/10/2019 12:37         Discharge Exam: Vitals:   03/11/19 2127 03/12/19 0551  BP: (!) 154/113 (!) 154/105  Pulse: 79 73  Resp: 18 16  Temp:  98.5 F (36.9 C)  SpO2: 99% 100%   Vitals:   03/11/19 1406 03/11/19 2009 03/11/19 2127 03/12/19 0551  BP: (!) 163/105  (!) 154/113 (!) 154/105  Pulse: 79  79 73  Resp: 19  18 16   Temp:    98.5 F (36.9 C)  TempSrc:    Oral  SpO2: 100% 98% 99% 100%  Weight:      Height:        General: Pt is alert, awake, not in acute distress    The results of significant diagnostics from this hospitalization (including imaging, microbiology, ancillary and laboratory) are listed below for reference.    Significant Diagnostic Studies: Ct  Abdomen Pelvis W Contrast  Result Date: 03/10/2019 CLINICAL DATA:  Abdominal pain with vomiting. History of ulcers. History of hepatitis-C. EXAM: CT ABDOMEN AND PELVIS WITH CONTRAST TECHNIQUE: Multidetector CT imaging of the abdomen and pelvis was performed using the standard protocol following bolus administration of intravenous contrast. CONTRAST:  149mL OMNIPAQUE IOHEXOL 300 MG/ML SOLN, 40mL OMNIPAQUE IOHEXOL 300 MG/ML SOLN COMPARISON:  CT 10/12/2013, 01/22/2014 FINDINGS: Lower chest: Lung bases are clear. Hepatobiliary: Low-attenuation liver suggests hepatic steatosis. Gallbladder normal. No biliary duct dilatation. Common bile duct is prominent but within normal limits at 5 mm through the pancreatic head. No obstructing lesion identified. Pancreas: There is inflammation involving the pancreatic head. Small amount of peripancreatic fluid. No pancreatic duct dilatation. The body and tail the pancreas are normal. No organized fluid collections. Spleen: Normal spleen Adrenals/urinary tract: Adrenal glands and kidneys are normal. The ureters and bladder normal. Stomach/Bowel: Large volume of oral contrast retained in the stomach. No evidence of obstructing lesion. Duodenum appears normal. Small bowel normal. Appendix normal. The colon and rectosigmoid colon are normal. Vascular/Lymphatic: No vascular complication associated presumed pancreatitis. Mild atherosclerotic calcification of the aorta. Abdominopelvic lymphadenopathy. Reproductive: Uterus and adnexa normal. 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 Other: No free fluid. Musculoskeletal: Posterior lumbar fusion. No aggressive osseous lesions. IMPRESSION: 1. Focal acute pancreatitis involving the uncinate process and head of the pancreas. 2. Prominence of the common bile duct without obstructing lesion. 3. Hepatic steatosis. 4. No cholelithiasis identified. 5. Moderate volume of oral contrast retained in the stomach. No obstructing lesion identified.  Electronically Signed   By: Suzy Bouchard M.D.   On: 03/10/2019 14:29   Mr 3d Recon At Scanner  Result Date: 03/11/2019 CLINICAL DATA:  Inpatient. Acute pancreatitis, first episode, etiology unknown, 2-3 days out. Abnormal liver function tests. EXAM: MRI ABDOMEN WITHOUT AND WITH CONTRAST (INCLUDING MRCP) TECHNIQUE: Multiplanar multisequence MR imaging of the abdomen was performed both before and after the administration of intravenous contrast. Heavily T2-weighted images of the biliary and pancreatic ducts were obtained, and three-dimensional MRCP images  were rendered by post processing. CONTRAST:  6 cc Gadavist IV. COMPARISON:  03/10/2019 CT abdomen/pelvis and abdominal sonogram. FINDINGS: Motion degraded scan, limiting assessment. Lower chest: No acute abnormality at the lung bases. Hepatobiliary: Normal liver size. No definite liver surface irregularity. Moderate diffuse hepatic steatosis. No liver mass. Sludge filled nondistended gallbladder with no cholelithiasis. No gallbladder wall thickening or pericholecystic fluid. No significant intrahepatic biliary ductal dilatation. Dilated common bile duct with 8 mm diameter with smooth distal tapering. No biliary filling defects to suggest choledocholithiasis. No biliary strictures or masses. Pancreas: There is pancreatic parenchymal edema in the pancreatic head and neck with minimal associated peripancreatic edema, compatible with acute pancreatitis, improved compared to the CT study from 1 day prior. There is preserved pancreatic parenchymal enhancement. No pancreatic mass or duct dilation. No peripancreatic fluid collections. No pancreas divisum. Spleen: Normal size. No mass. Adrenals/Urinary Tract: Normal adrenals. No hydronephrosis. Normal kidneys with no renal mass. Stomach/Bowel: Normal non-distended stomach. Visualized small and large bowel is normal caliber, with no bowel wall thickening. Vascular/Lymphatic: Normal caliber abdominal aorta. Patent  portal, splenic, hepatic and renal veins. No pathologically enlarged lymph nodes in the abdomen. Other: No abdominal ascites or focal fluid collection. Musculoskeletal: No aggressive appearing focal osseous lesions. Bilateral posterior spinal fusion in the lower lumbar spine. IMPRESSION: 1. Acute non-necrotizing pancreatitis involving the pancreatic head and neck, improved compared to the CT study from 1 day prior. No peripancreatic fluid collections. No pancreatic mass. 2. Mildly dilated common bile duct (8 mm diameter). No significant intrahepatic biliary ductal dilatation. No evidence of choledocholithiasis. No biliary strictures or masses. 3. Sludge filled and otherwise normal gallbladder with no cholelithiasis. 4. Moderate diffuse hepatic steatosis. No morphologic changes of hepatic cirrhosis. No liver masses. Electronically Signed   By: Ilona Sorrel M.D.   On: 03/11/2019 15:33   Mr Abdomen Mrcp Moise Boring Contast  Result Date: 03/11/2019 CLINICAL DATA:  Inpatient. Acute pancreatitis, first episode, etiology unknown, 2-3 days out. Abnormal liver function tests. EXAM: MRI ABDOMEN WITHOUT AND WITH CONTRAST (INCLUDING MRCP) TECHNIQUE: Multiplanar multisequence MR imaging of the abdomen was performed both before and after the administration of intravenous contrast. Heavily T2-weighted images of the biliary and pancreatic ducts were obtained, and three-dimensional MRCP images were rendered by post processing. CONTRAST:  6 cc Gadavist IV. COMPARISON:  03/10/2019 CT abdomen/pelvis and abdominal sonogram. FINDINGS: Motion degraded scan, limiting assessment. Lower chest: No acute abnormality at the lung bases. Hepatobiliary: Normal liver size. No definite liver surface irregularity. Moderate diffuse hepatic steatosis. No liver mass. Sludge filled nondistended gallbladder with no cholelithiasis. No gallbladder wall thickening or pericholecystic fluid. No significant intrahepatic biliary ductal dilatation. Dilated common  bile duct with 8 mm diameter with smooth distal tapering. No biliary filling defects to suggest choledocholithiasis. No biliary strictures or masses. Pancreas: There is pancreatic parenchymal edema in the pancreatic head and neck with minimal associated peripancreatic edema, compatible with acute pancreatitis, improved compared to the CT study from 1 day prior. There is preserved pancreatic parenchymal enhancement. No pancreatic mass or duct dilation. No peripancreatic fluid collections. No pancreas divisum. Spleen: Normal size. No mass. Adrenals/Urinary Tract: Normal adrenals. No hydronephrosis. Normal kidneys with no renal mass. Stomach/Bowel: Normal non-distended stomach. Visualized small and large bowel is normal caliber, with no bowel wall thickening. Vascular/Lymphatic: Normal caliber abdominal aorta. Patent portal, splenic, hepatic and renal veins. No pathologically enlarged lymph nodes in the abdomen. Other: No abdominal ascites or focal fluid collection. Musculoskeletal: No aggressive appearing focal osseous lesions. Bilateral  posterior spinal fusion in the lower lumbar spine. IMPRESSION: 1. Acute non-necrotizing pancreatitis involving the pancreatic head and neck, improved compared to the CT study from 1 day prior. No peripancreatic fluid collections. No pancreatic mass. 2. Mildly dilated common bile duct (8 mm diameter). No significant intrahepatic biliary ductal dilatation. No evidence of choledocholithiasis. No biliary strictures or masses. 3. Sludge filled and otherwise normal gallbladder with no cholelithiasis. 4. Moderate diffuse hepatic steatosis. No morphologic changes of hepatic cirrhosis. No liver masses. Electronically Signed   By: Ilona Sorrel M.D.   On: 03/11/2019 15:33   US Abdomen Limited Ruq  Result Date: 03/10/2019 CLINICAL DATA:  Epigastric pain for 6 hours, ETOH. EXAM: ULTRASOUND ABDOMEN LIMITED RIGHT UPPER QUADRANT COMPARISON:  None. FINDINGS: Gallbladder: Gallbladder is filled with  sludge. Associated gallbladder distension, moderate in degree. No gallbladder wall thickening or pericholecystic fluid seen. No sonographic Murphy's sign elicited during the exam. Common bile duct: Diameter: 8 mm Liver: Liver is diffusely echogenic indicating fatty infiltration. No focal liver abnormality. Portal vein is patent on color Doppler imaging with normal direction of blood flow towards the liver. Other: None. IMPRESSION: 1. Common bile duct is distended, measuring 8 mm. This may be due to obstructing stone in the distal common bile duct. Recommend correlation with LFTs and would consider further characterization with ERCP or MRCP. 2. Gallbladder is filled with sludge. No gallbladder wall thickening, sonographic Murphy's sign or other secondary signs of acute cholecystitis. 3. Fatty infiltration of the liver. Electronically Signed   By: Franki Cabot M.D.   On: 03/10/2019 12:37     Microbiology: Recent Results (from the past 240 hour(s))  SARS Coronavirus 2 [facility] order, Performed in [facility] hospital lab) Nasopharyngeal Nasopharyngeal Swab     Status: None   Collection Time: 03/10/19  3:37 PM   Specimen: Nasopharyngeal Swab  Result Value Ref Range Status   SARS Coronavirus 2 NEGATIVE NEGATIVE Final    Comment: (NOTE) If result is NEGATIVE SARS-CoV-2 target nucleic acids are NOT DETECTED. The SARS-CoV-2 RNA is generally detectable in upper and lower  respiratory specimens during the acute phase of infection. The lowest  concentration of SARS-CoV-2 viral copies this assay can detect is 250  copies / mL. A negative result does not preclude SARS-CoV-2 infection  and should not be used as the sole basis for treatment or other  patient management decisions.  A negative result may occur with  improper specimen collection / handling, submission of specimen other  than nasopharyngeal swab, presence of viral mutation(s) within the  areas targeted by this assay, and inadequate number of  viral copies  (<250 copies / mL). A negative result must be combined with clinical  observations, patient history, and epidemiological information. If result is POSITIVE SARS-CoV-2 target nucleic acids are DETECTED. The SARS-CoV-2 RNA is generally detectable in upper and lower  respiratory specimens dur ing the acute phase of infection.  Positive  results are indicative of active infection with SARS-CoV-2.  Clinical  correlation with patient history and other diagnostic information is  necessary to determine patient infection status.  Positive results do  not rule out bacterial infection or co-infection with other viruses. If result is PRESUMPTIVE POSTIVE SARS-CoV-2 nucleic acids MAY BE PRESENT.   A presumptive positive result was obtained on the submitted specimen  and confirmed on repeat testing.  While 2019 novel coronavirus  (SARS-CoV-2) nucleic acids may be present in the submitted sample  additional confirmatory testing may be necessary for epidemiological  and /  or clinical management purposes  to differentiate between  SARS-CoV-2 and other Sarbecovirus currently known to infect humans.  If clinically indicated additional testing with an alternate test  methodology [phone]) is advised. The SARS-CoV-2 RNA is generally  detectable in upper and lower respiratory sp ecimens during the acute  phase of infection. The expected result is Negative. Fact Sheet for Patients:  StrictlyIdeas.no Fact Sheet for Healthcare Providers: BankingDealers.co.za This test is not yet approved or cleared by the [place] FDA and has been authorized for detection and/or diagnosis of SARS-CoV-2 by FDA under an Emergency Use Authorization (EUA).  This EUA will remain in effect (meaning this test can be used) for the duration of the COVID-19 declaration under Section 564(b)(1) of the Act, 21 U.S.C. section 360bbb-3(b)(1), unless the authorization is  terminated or revoked sooner. Performed at [facility], [address] [place], [place] [address]      Labs: Basic Metabolic Panel: Recent Labs  Lab 03/10/19 0936 03/11/19 0441 03/12/19 0444  NA 142 136 136  K 3.1* 3.8 3.6  CL 97* 99 104  CO2 22 28 23   GLUCOSE 88 94 84  BUN 11 7 5*  CREATININE 0.63 0.51 0.43*  CALCIUM 8.9 8.7* 8.6*   Liver Function Tests: Recent Labs  Lab 03/10/19 0936 03/11/19 0441 03/12/19 0444  AST 149* 84* 52*  ALT 85* 62* 48*  ALKPHOS 50 47 43  BILITOT 0.7 1.0 1.1  PROT 8.1 7.1 6.8  ALBUMIN 5.1* 4.3 3.9   Recent Labs  Lab 03/10/19 0936 03/12/19 0444  LIPASE 379* 181*   No results for input(s): AMMONIA in the last 168 hours. CBC: Recent Labs  Lab 03/10/19 0936 03/11/19 0441 03/12/19 0444  WBC 18.6* 12.8* 8.3  NEUTROABS 15.3*  --   --   HGB 14.6 14.2 13.7  HCT 44.9 42.7 41.4  MCV 98.2 98.2 99.0  PLT 248 153 130*   Cardiac Enzymes: No results for input(s): CKTOTAL, CKMB, CKMBINDEX, TROPONINI in the last 168 hours. BNP: Invalid input(s): POCBNP CBG: No results for input(s): GLUCAP in the last 168 hours.  Time coordinating discharge:  36 minutes included time spent earlier in the day 8/18  Signed:  Orson Eva, DO [facility] Pager: [phone] 03/12/2019, 4:04 PM

## 2019-03-12 NOTE — Progress Notes (Signed)
Patient requesting to leave AMA. MD has been notified and is in room with patient.

## 2019-03-12 NOTE — Progress Notes (Signed)
  Subjective:  Patient has multiple complaints.  She says she is not present.  She is not happy that she has to wait 1 hour before she gets pain medication.  According to nursing staff she has threatened to leave AMA.  She said she did vomit a small amount of Jell-O last evening.  No vomiting this morning.  She rates her pain about 6.  She is also complaining pain at IV site and wants IVs to be changed.  Objective: Blood pressure (!) 154/105, pulse 73, temperature 98.5 F (36.9 C), temperature source Oral, resp. rate 16, height '5\' 6"'$  (1.676 m), weight 55 kg, last menstrual period 02/17/2019, SpO2 100 %. Patient is sitting in a chair in tears. She has IV in left antecubital region some edema but no redness to the skin. Abdomen was not examined.   Labs/studies Results:  CBC Latest Ref Rng & Units 03/12/2019 03/11/2019 03/10/2019  WBC 4.0 - 10.5 K/uL 8.3 12.8(H) 18.6(H)  Hemoglobin 12.0 - 15.0 g/dL 13.7 14.2 14.6  Hematocrit 36.0 - 46.0 % 41.4 42.7 44.9  Platelets 150 - 400 K/uL 130(L) 153 248    CMP Latest Ref Rng & Units 03/12/2019 03/11/2019 03/10/2019  Glucose 70 - 99 mg/dL 84 94 88  BUN 6 - 20 mg/dL 5(L) 7 11  Creatinine 0.44 - 1.00 mg/dL 0.43(L) 0.51 0.63  Sodium 135 - 145 mmol/L 136 136 142  Potassium 3.5 - 5.1 mmol/L 3.6 3.8 3.1(L)  Chloride 98 - 111 mmol/L 104 99 97(L)  CO2 22 - 32 mmol/L '23 28 22  '$ Calcium 8.9 - 10.3 mg/dL 8.6(L) 8.7(L) 8.9  Total Protein 6.5 - 8.1 g/dL 6.8 7.1 8.1  Total Bilirubin 0.3 - 1.2 mg/dL 1.1 1.0 0.7  Alkaline Phos 38 - 126 U/L 43 47 50  AST 15 - 41 U/L 52(H) 84(H) 149(H)  ALT 0 - 44 U/L 48(H) 62(H) 85(H)    Hepatic Function Latest Ref Rng & Units 03/12/2019 03/11/2019 03/10/2019  Total Protein 6.5 - 8.1 g/dL 6.8 7.1 8.1  Albumin 3.5 - 5.0 g/dL 3.9 4.3 5.1(H)  AST 15 - 41 U/L 52(H) 84(H) 149(H)  ALT 0 - 44 U/L 48(H) 62(H) 85(H)  Alk Phosphatase 38 - 126 U/L 43 47 50  Total Bilirubin 0.3 - 1.2 mg/dL 1.1 1.0 0.7    Serum lipase 181 Serum cholesterol  178 HDL 87 LDL 75 and triglycerides 79.   Assessment:  #1.  Acute pancreatitis felt to be secondary to alcohol abuse.  No evidence of necrotizing pancreatitis on CT and or MR.  Concern for biliary pancreatitis given mildly dilated bile duct and elevated transaminases but no evidence of choledocholithiasis.  I feel elevated transaminases are secondary to alcoholic hepatitis.  Leukocytosis has resolved.   #2.  Elevated transaminases secondary to alcoholic hepatitis.  She has dominant fatty liver on all imaging studies.  Examinations are trending down.  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  HCVRNA pending.  #3.  Mild thrombocytopenia.  She also had low platelet back in 2015.  Number cytopenia may be due to 1 of her medications or related to alcohol effect on marrow.  #4.  Alcohol abuse.  Patient is on CIWA protocol.   Recommendations:  DC IV antibiotic. Continue clear liquids for now. Nursing staff to change IV site.

## 2019-03-13 LAB — HCV RNA QUANT
HCV Quantitative Log: 5.243 log10 IU/mL (ref >1.70)
HCV Quantitative: 175000 IU/mL (ref >50)

## 2019-03-14 ENCOUNTER — Telehealth (INDEPENDENT_AMBULATORY_CARE_PROVIDER_SITE_OTHER): Payer: Self-pay | Admitting: Internal Medicine

## 2019-03-14 ENCOUNTER — Encounter (INDEPENDENT_AMBULATORY_CARE_PROVIDER_SITE_OTHER): Payer: Self-pay | Admitting: *Deleted

## 2019-03-14 NOTE — Telephone Encounter (Signed)
Patient called stated she saw Dr Laural Golden while she was in the hospital recently - would like for him to call her - ph# [phone]

## 2019-03-14 NOTE — Telephone Encounter (Signed)
Talked with the patient and she states that she in hospital Sunday - Tuesday. She remembers Dr.Rehman talking to her about Pancreatitis , and other test but still can't remember a lot of what was said. Patient states that she wishes that she had stayed in the hospital. [facility] and that may be next week.  She ask if she needs to be on a medication? She is also asking if Dr. Laural Golden would write a work note that she was in the hospital Sun- Tuesday? Mentions that Dr.Rehman ask her to call the office. To be addressed with Dr.Rehman .

## 2019-03-14 NOTE — Telephone Encounter (Signed)
Talked with Dr.Rehman. He states there is no medication that the patient needs to be on. She needs to stop drinking alcohol. When the patient gets appointment with Jerene Dilling , have her to call him and he will go over things with her. Patient was called and made aware , and she was also advised per Dr.Rehman the she had Hep C. Note for work was also completed , patient may pick up after Dr.Rehman signs.

## 2019-03-27 ENCOUNTER — Emergency Department (HOSPITAL_COMMUNITY)
Admission: EM | Admit: 2019-03-27 | Discharge: 2019-03-27 | Disposition: A | Discharge status: Home / Self Care | Payer: Medicaid Other | Attending: Emergency Medicine | Admitting: Emergency Medicine

## 2019-03-27 ENCOUNTER — Encounter (HOSPITAL_COMMUNITY): Payer: Self-pay

## 2019-03-27 ENCOUNTER — Other Ambulatory Visit: Payer: Self-pay

## 2019-03-27 DIAGNOSIS — F10129 Alcohol abuse with intoxication, unspecified: Secondary | ICD-10-CM | POA: Insufficient documentation

## 2019-03-27 DIAGNOSIS — Z5321 Procedure and treatment not carried out due to patient leaving prior to being seen by health care provider: Secondary | ICD-10-CM | POA: Insufficient documentation

## 2019-03-27 NOTE — ED Notes (Signed)
Pt stated that she could rather see her bf who is also a patient than to be treated.

## 2019-03-27 NOTE — ED Triage Notes (Signed)
Pt reports being a chronic alcoholic and wants help getting clean. States that she was recently seen here for pancreatitis. States that she still has stomach pain. Had 8 beers today because she was in withdrawal.  Denies SI/HI

## 2019-03-30 ENCOUNTER — Emergency Department (HOSPITAL_COMMUNITY): Payer: Self-pay

## 2019-03-30 ENCOUNTER — Encounter (HOSPITAL_COMMUNITY): Payer: Self-pay | Admitting: Emergency Medicine

## 2019-03-30 ENCOUNTER — Emergency Department (HOSPITAL_COMMUNITY)
Admission: EM | Admit: 2019-03-30 | Discharge: 2019-03-30 | Discharge status: Against Medical Advice | Payer: Self-pay | Attending: Emergency Medicine | Admitting: Emergency Medicine

## 2019-03-30 ENCOUNTER — Other Ambulatory Visit: Payer: Self-pay

## 2019-03-30 DIAGNOSIS — F1020 Alcohol dependence, uncomplicated: Secondary | ICD-10-CM

## 2019-03-30 DIAGNOSIS — R1013 Epigastric pain: Secondary | ICD-10-CM

## 2019-03-30 DIAGNOSIS — R079 Chest pain, unspecified: Secondary | ICD-10-CM

## 2019-03-30 DIAGNOSIS — F10920 Alcohol use, unspecified with intoxication, uncomplicated: Secondary | ICD-10-CM

## 2019-03-30 DIAGNOSIS — R0789 Other chest pain: Secondary | ICD-10-CM | POA: Insufficient documentation

## 2019-03-30 DIAGNOSIS — F10229 Alcohol dependence with intoxication, unspecified: Secondary | ICD-10-CM | POA: Insufficient documentation

## 2019-03-30 DIAGNOSIS — F17210 Nicotine dependence, cigarettes, uncomplicated: Secondary | ICD-10-CM | POA: Insufficient documentation

## 2019-03-30 DIAGNOSIS — Y908 Blood alcohol level of 240 mg/100 ml or more: Secondary | ICD-10-CM | POA: Insufficient documentation

## 2019-03-30 LAB — RAPID URINE DRUG SCREEN, HOSP PERFORMED
Amphetamines: NOT DETECTED
Barbiturates: NOT DETECTED
Benzodiazepines: NOT DETECTED
Cocaine: POSITIVE — AB
Opiates: NOT DETECTED
Tetrahydrocannabinol: POSITIVE — AB

## 2019-03-30 LAB — COMPREHENSIVE METABOLIC PANEL
ALT: 41 U/L (ref 0–44)
AST: 91 U/L — ABNORMAL HIGH (ref 15–41)
Albumin: 4.4 g/dL (ref 3.5–5.0)
Alkaline Phosphatase: 49 U/L (ref 38–126)
Anion gap: 13 (ref 5–15)
BUN: 8 mg/dL (ref 6–20)
CO2: 23 mmol/L (ref 22–32)
Calcium: 8.3 mg/dL — ABNORMAL LOW (ref 8.9–10.3)
Chloride: 103 mmol/L (ref 98–111)
Creatinine, Ser: 0.53 mg/dL (ref 0.44–1.00)
GFR calc Af Amer: >60 mL/min (ref >60)
GFR calc non Af Amer: >60 mL/min (ref >60)
Glucose, Bld: 93 mg/dL (ref 70–99)
Potassium: 3.8 mmol/L (ref 3.5–5.1)
Sodium: 139 mmol/L (ref 135–145)
Total Bilirubin: 0.5 mg/dL (ref 0.3–1.2)
Total Protein: 7.5 g/dL (ref 6.5–8.1)

## 2019-03-30 LAB — CBC WITH DIFFERENTIAL/PLATELET
Abs Immature Granulocytes: 0.01 10*3/uL (ref 0.00–0.07)
Basophils Absolute: 0 10*3/uL (ref 0.0–0.1)
Basophils Relative: 1 %
Eosinophils Absolute: 0.1 10*3/uL (ref 0.0–0.5)
Eosinophils Relative: 1 %
HCT: 44.8 % (ref 36.0–46.0)
Hemoglobin: 15.3 g/dL — ABNORMAL HIGH (ref 12.0–15.0)
Immature Granulocytes: 0 %
Lymphocytes Relative: 48 %
Lymphs Abs: 3.2 10*3/uL (ref 0.7–4.0)
MCH: 32.7 pg (ref 26.0–34.0)
MCHC: 34.2 g/dL (ref 30.0–36.0)
MCV: 95.7 fL (ref 80.0–100.0)
Monocytes Absolute: 0.4 10*3/uL (ref 0.1–1.0)
Monocytes Relative: 7 %
Neutro Abs: 2.9 10*3/uL (ref 1.7–7.7)
Neutrophils Relative %: 43 %
Platelets: 102 10*3/uL — ABNORMAL LOW (ref 150–400)
RBC: 4.68 MIL/uL (ref 3.87–5.11)
RDW: 14 % (ref 11.5–15.5)
WBC: 6.6 10*3/uL (ref 4.0–10.5)
nRBC: 0 % (ref 0.0–0.2)

## 2019-03-30 LAB — ETHANOL: Alcohol, Ethyl (B): 458 mg/dL (ref <10)

## 2019-03-30 LAB — LIPASE, BLOOD: Lipase: 303 U/L — ABNORMAL HIGH (ref 11–51)

## 2019-03-30 NOTE — ED Triage Notes (Signed)
Pt reports productive cough for 2 weeks with right sided chest pain with coughing.
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
## 2019-03-30 NOTE — ED Notes (Signed)
Date and time results received: 03/30/19 0857 (use smartphrase ".now" to insert current time)  Test: etoh Critical Value: 458  Name of Provider Notified: dr Eulis Foster  Orders Received? Or Actions Taken?: see chart

## 2019-03-30 NOTE — ED Notes (Signed)
Pt put her clothes on and left without telling the staff that she was leaving pt in no distress gait steady
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
## 2019-03-31 ENCOUNTER — Inpatient Hospital Stay (HOSPITAL_COMMUNITY)
Admission: EM | Admit: 2019-03-31 | Discharge: 2019-04-04 | DRG: 438 | Disposition: A | Discharge status: Home / Self Care | Payer: Self-pay | Attending: Internal Medicine | Admitting: Internal Medicine

## 2019-03-31 ENCOUNTER — Emergency Department (HOSPITAL_COMMUNITY): Payer: Self-pay

## 2019-03-31 ENCOUNTER — Encounter (HOSPITAL_COMMUNITY): Payer: Self-pay | Admitting: Emergency Medicine

## 2019-03-31 ENCOUNTER — Other Ambulatory Visit: Payer: Self-pay

## 2019-03-31 DIAGNOSIS — Z79899 Other long term (current) drug therapy: Secondary | ICD-10-CM

## 2019-03-31 DIAGNOSIS — Z823 Family history of stroke: Secondary | ICD-10-CM

## 2019-03-31 DIAGNOSIS — Z681 Body mass index (BMI) 19 or less, adult: Secondary | ICD-10-CM

## 2019-03-31 DIAGNOSIS — F1020 Alcohol dependence, uncomplicated: Secondary | ICD-10-CM | POA: Diagnosis present

## 2019-03-31 DIAGNOSIS — R45851 Suicidal ideations: Secondary | ICD-10-CM | POA: Diagnosis present

## 2019-03-31 DIAGNOSIS — F1410 Cocaine abuse, uncomplicated: Secondary | ICD-10-CM | POA: Diagnosis present

## 2019-03-31 DIAGNOSIS — F419 Anxiety disorder, unspecified: Secondary | ICD-10-CM | POA: Diagnosis present

## 2019-03-31 DIAGNOSIS — B182 Chronic viral hepatitis C: Secondary | ICD-10-CM

## 2019-03-31 DIAGNOSIS — Y907 Blood alcohol level of 200-239 mg/100 ml: Secondary | ICD-10-CM | POA: Diagnosis present

## 2019-03-31 DIAGNOSIS — Z811 Family history of alcohol abuse and dependence: Secondary | ICD-10-CM

## 2019-03-31 DIAGNOSIS — F1010 Alcohol abuse, uncomplicated: Secondary | ICD-10-CM

## 2019-03-31 DIAGNOSIS — E876 Hypokalemia: Secondary | ICD-10-CM

## 2019-03-31 DIAGNOSIS — E43 Unspecified severe protein-calorie malnutrition: Secondary | ICD-10-CM | POA: Diagnosis present

## 2019-03-31 DIAGNOSIS — F329 Major depressive disorder, single episode, unspecified: Secondary | ICD-10-CM | POA: Diagnosis present

## 2019-03-31 DIAGNOSIS — Z791 Long term (current) use of non-steroidal anti-inflammatories (NSAID): Secondary | ICD-10-CM

## 2019-03-31 DIAGNOSIS — Z8249 Family history of ischemic heart disease and other diseases of the circulatory system: Secondary | ICD-10-CM

## 2019-03-31 DIAGNOSIS — Z8711 Personal history of peptic ulcer disease: Secondary | ICD-10-CM

## 2019-03-31 DIAGNOSIS — F1210 Cannabis abuse, uncomplicated: Secondary | ICD-10-CM | POA: Diagnosis present

## 2019-03-31 DIAGNOSIS — F10239 Alcohol dependence with withdrawal, unspecified: Secondary | ICD-10-CM | POA: Diagnosis present

## 2019-03-31 DIAGNOSIS — D696 Thrombocytopenia, unspecified: Secondary | ICD-10-CM | POA: Diagnosis present

## 2019-03-31 DIAGNOSIS — F10229 Alcohol dependence with intoxication, unspecified: Secondary | ICD-10-CM | POA: Diagnosis present

## 2019-03-31 DIAGNOSIS — B1920 Unspecified viral hepatitis C without hepatic coma: Secondary | ICD-10-CM | POA: Diagnosis present

## 2019-03-31 DIAGNOSIS — Z20828 Contact with and (suspected) exposure to other viral communicable diseases: Secondary | ICD-10-CM | POA: Diagnosis present

## 2019-03-31 DIAGNOSIS — S0012XA Contusion of left eyelid and periocular area, initial encounter: Secondary | ICD-10-CM | POA: Diagnosis present

## 2019-03-31 DIAGNOSIS — M549 Dorsalgia, unspecified: Secondary | ICD-10-CM | POA: Diagnosis present

## 2019-03-31 DIAGNOSIS — I10 Essential (primary) hypertension: Secondary | ICD-10-CM | POA: Diagnosis present

## 2019-03-31 DIAGNOSIS — F17210 Nicotine dependence, cigarettes, uncomplicated: Secondary | ICD-10-CM | POA: Diagnosis present

## 2019-03-31 DIAGNOSIS — K859 Acute pancreatitis without necrosis or infection, unspecified: Secondary | ICD-10-CM | POA: Diagnosis present

## 2019-03-31 DIAGNOSIS — Z87442 Personal history of urinary calculi: Secondary | ICD-10-CM

## 2019-03-31 DIAGNOSIS — K852 Alcohol induced acute pancreatitis without necrosis or infection: Principal | ICD-10-CM | POA: Diagnosis present

## 2019-03-31 DIAGNOSIS — F410 Panic disorder [episodic paroxysmal anxiety] without agoraphobia: Secondary | ICD-10-CM | POA: Diagnosis present

## 2019-03-31 DIAGNOSIS — F1920 Other psychoactive substance dependence, uncomplicated: Secondary | ICD-10-CM | POA: Diagnosis present

## 2019-03-31 DIAGNOSIS — G8929 Other chronic pain: Secondary | ICD-10-CM | POA: Diagnosis present

## 2019-03-31 DIAGNOSIS — Z8041 Family history of malignant neoplasm of ovary: Secondary | ICD-10-CM

## 2019-03-31 HISTORY — DX: Unspecified convulsions: R56.9

## 2019-03-31 LAB — CBC WITH DIFFERENTIAL/PLATELET
Abs Immature Granulocytes: 0.03 10*3/uL (ref 0.00–0.07)
Basophils Absolute: 0 10*3/uL (ref 0.0–0.1)
Basophils Relative: 0 %
Eosinophils Absolute: 0.1 10*3/uL (ref 0.0–0.5)
Eosinophils Relative: 1 %
HCT: 42.5 % (ref 36.0–46.0)
Hemoglobin: 14.9 g/dL (ref 12.0–15.0)
Immature Granulocytes: 0 %
Lymphocytes Relative: 21 %
Lymphs Abs: 2.2 10*3/uL (ref 0.7–4.0)
MCH: 32.7 pg (ref 26.0–34.0)
MCHC: 35.1 g/dL (ref 30.0–36.0)
MCV: 93.4 fL (ref 80.0–100.0)
Monocytes Absolute: 0.4 10*3/uL (ref 0.1–1.0)
Monocytes Relative: 3 %
Neutro Abs: 7.9 10*3/uL — ABNORMAL HIGH (ref 1.7–7.7)
Neutrophils Relative %: 75 %
Platelets: 83 10*3/uL — ABNORMAL LOW (ref 150–400)
RBC: 4.55 MIL/uL (ref 3.87–5.11)
RDW: 14 % (ref 11.5–15.5)
WBC: 10.6 10*3/uL — ABNORMAL HIGH (ref 4.0–10.5)
nRBC: 0 % (ref 0.0–0.2)

## 2019-03-31 LAB — URINALYSIS, ROUTINE W REFLEX MICROSCOPIC
Bilirubin Urine: NEGATIVE
Glucose, UA: NEGATIVE mg/dL
Hgb urine dipstick: NEGATIVE
Ketones, ur: NEGATIVE mg/dL
Leukocytes,Ua: NEGATIVE
Nitrite: NEGATIVE
Protein, ur: NEGATIVE mg/dL
Specific Gravity, Urine: 1.005 (ref 1.005–1.030)
pH: 7 (ref 5.0–8.0)

## 2019-03-31 LAB — COMPREHENSIVE METABOLIC PANEL
ALT: 39 U/L (ref 0–44)
AST: 83 U/L — ABNORMAL HIGH (ref 15–41)
Albumin: 4.5 g/dL (ref 3.5–5.0)
Alkaline Phosphatase: 49 U/L (ref 38–126)
Anion gap: 17 — ABNORMAL HIGH (ref 5–15)
BUN: 5 mg/dL — ABNORMAL LOW (ref 6–20)
CO2: 24 mmol/L (ref 22–32)
Calcium: 9.1 mg/dL (ref 8.9–10.3)
Chloride: 99 mmol/L (ref 98–111)
Creatinine, Ser: 0.7 mg/dL (ref 0.44–1.00)
GFR calc Af Amer: >60 mL/min (ref >60)
GFR calc non Af Amer: >60 mL/min (ref >60)
Glucose, Bld: 93 mg/dL (ref 70–99)
Potassium: 3.3 mmol/L — ABNORMAL LOW (ref 3.5–5.1)
Sodium: 140 mmol/L (ref 135–145)
Total Bilirubin: 0.7 mg/dL (ref 0.3–1.2)
Total Protein: 7.8 g/dL (ref 6.5–8.1)

## 2019-03-31 LAB — CREATININE, SERUM
Creatinine, Ser: 0.54 mg/dL (ref 0.44–1.00)
GFR calc Af Amer: >60 mL/min (ref >60)
GFR calc non Af Amer: >60 mL/min (ref >60)

## 2019-03-31 LAB — RAPID URINE DRUG SCREEN, HOSP PERFORMED
Amphetamines: NOT DETECTED
Barbiturates: NOT DETECTED
Benzodiazepines: NOT DETECTED
Cocaine: POSITIVE — AB
Opiates: POSITIVE — AB
Tetrahydrocannabinol: NOT DETECTED

## 2019-03-31 LAB — CBC
HCT: 43.1 % (ref 36.0–46.0)
Hemoglobin: 14.5 g/dL (ref 12.0–15.0)
MCH: 32.2 pg (ref 26.0–34.0)
MCHC: 33.6 g/dL (ref 30.0–36.0)
MCV: 95.8 fL (ref 80.0–100.0)
Platelets: 67 10*3/uL — ABNORMAL LOW (ref 150–400)
RBC: 4.5 MIL/uL (ref 3.87–5.11)
RDW: 14.2 % (ref 11.5–15.5)
WBC: 8.3 10*3/uL (ref 4.0–10.5)
nRBC: 0 % (ref 0.0–0.2)

## 2019-03-31 LAB — I-STAT BETA HCG BLOOD, ED (MC, WL, AP ONLY): I-stat hCG, quantitative: <5 m[IU]/mL (ref <5)

## 2019-03-31 LAB — ETHANOL: Alcohol, Ethyl (B): 209 mg/dL — ABNORMAL HIGH (ref <10)

## 2019-03-31 LAB — AMYLASE: Amylase: 185 U/L — ABNORMAL HIGH (ref 28–100)

## 2019-03-31 LAB — PHOSPHORUS: Phosphorus: 2.8 mg/dL (ref 2.5–4.6)

## 2019-03-31 LAB — LIPASE, BLOOD: Lipase: 347 U/L — ABNORMAL HIGH (ref 11–51)

## 2019-03-31 LAB — MAGNESIUM: Magnesium: 1.7 mg/dL (ref 1.7–2.4)

## 2019-03-31 MED ORDER — SODIUM CHLORIDE 0.9 % IV SOLN
INTRAVENOUS | Status: DC
  Administered 2019-04-01 – 2019-04-02 (×3): via INTRAVENOUS

## 2019-03-31 MED ORDER — VITAMIN B-1 100 MG PO TABS
100.0000 mg | ORAL_TABLET | Freq: Every day | ORAL | Status: DC
  Administered 2019-04-02 – 2019-04-04 (×3): 100 mg via ORAL
  Filled 2019-03-31 (×5): qty 1

## 2019-03-31 MED ORDER — ONDANSETRON HCL 4 MG/2ML IJ SOLN
4.0000 mg | Freq: Four times a day (QID) | INTRAMUSCULAR | Status: DC | PRN
  Administered 2019-04-01 – 2019-04-03 (×6): 4 mg via INTRAVENOUS
  Filled 2019-03-31 (×6): qty 2

## 2019-03-31 MED ORDER — POTASSIUM CHLORIDE IN NACL 40-0.9 MEQ/L-% IV SOLN
INTRAVENOUS | Status: AC
  Administered 2019-03-31: 125 mL/h via INTRAVENOUS
  Filled 2019-03-31: qty 1000

## 2019-03-31 MED ORDER — HYDROMORPHONE HCL 1 MG/ML IJ SOLN
0.5000 mg | INTRAMUSCULAR | Status: AC | PRN
  Administered 2019-03-31 (×2): 0.5 mg via INTRAVENOUS
  Filled 2019-03-31 (×2): qty 1

## 2019-03-31 MED ORDER — ENOXAPARIN SODIUM 40 MG/0.4ML ~~LOC~~ SOLN: 40.0000 mg | SUBCUTANEOUS | Status: DC

## 2019-03-31 MED ORDER — SODIUM CHLORIDE 0.9 % IV BOLUS
1000.0000 mL | Freq: Once | INTRAVENOUS | Status: AC
  Administered 2019-03-31: 13:00:00 1000 mL via INTRAVENOUS

## 2019-03-31 MED ORDER — FOLIC ACID 1 MG PO TABS
1.0000 mg | ORAL_TABLET | Freq: Every day | ORAL | Status: DC
  Administered 2019-03-31 – 2019-04-04 (×5): 1 mg via ORAL
  Filled 2019-03-31 (×5): qty 1

## 2019-03-31 MED ORDER — LORAZEPAM 1 MG PO TABS
0.0000 mg | ORAL_TABLET | Freq: Four times a day (QID) | ORAL | Status: DC
  Filled 2019-03-31: qty 1

## 2019-03-31 MED ORDER — LORAZEPAM 2 MG/ML IJ SOLN
0.0000 mg | Freq: Four times a day (QID) | INTRAMUSCULAR | Status: DC
  Administered 2019-03-31 – 2019-04-01 (×4): 2 mg via INTRAVENOUS
  Filled 2019-03-31 (×4): qty 1

## 2019-03-31 MED ORDER — THIAMINE HCL 100 MG/ML IJ SOLN
100.0000 mg | Freq: Every day | INTRAMUSCULAR | Status: DC
  Administered 2019-03-31: 100 mg via INTRAVENOUS
  Filled 2019-03-31: qty 2

## 2019-03-31 MED ORDER — PANTOPRAZOLE SODIUM 40 MG IV SOLR
40.0000 mg | INTRAVENOUS | Status: DC
  Administered 2019-03-31 – 2019-04-03 (×4): 40 mg via INTRAVENOUS
  Filled 2019-03-31 (×4): qty 40

## 2019-03-31 MED ORDER — ADULT MULTIVITAMIN W/MINERALS CH
1.0000 | ORAL_TABLET | Freq: Every day | ORAL | Status: DC
  Administered 2019-03-31 – 2019-04-04 (×5): 1 via ORAL
  Filled 2019-03-31 (×5): qty 1

## 2019-03-31 MED ORDER — LORAZEPAM 1 MG PO TABS: 0.0000 mg | ORAL_TABLET | Freq: Two times a day (BID) | ORAL | Status: DC

## 2019-03-31 MED ORDER — ONDANSETRON HCL 4 MG PO TABS
4.0000 mg | ORAL_TABLET | Freq: Four times a day (QID) | ORAL | Status: DC | PRN
  Administered 2019-03-31 – 2019-04-03 (×2): 4 mg via ORAL
  Filled 2019-03-31 (×2): qty 1

## 2019-03-31 MED ORDER — THIAMINE HCL 100 MG/ML IJ SOLN
Freq: Once | INTRAVENOUS | Status: AC
  Administered 2019-03-31: 19:00:00 via INTRAVENOUS
  Filled 2019-03-31: qty 1000

## 2019-03-31 MED ORDER — ONDANSETRON HCL 4 MG/2ML IJ SOLN
4.0000 mg | Freq: Once | INTRAMUSCULAR | Status: AC
  Administered 2019-03-31: 13:00:00 4 mg via INTRAVENOUS
  Filled 2019-03-31: qty 2

## 2019-03-31 MED ORDER — LORAZEPAM 2 MG/ML IJ SOLN: 0.0000 mg | Freq: Two times a day (BID) | INTRAMUSCULAR | Status: DC

## 2019-03-31 MED ORDER — IOHEXOL 300 MG/ML  SOLN
100.0000 mL | Freq: Once | INTRAMUSCULAR | Status: AC | PRN
  Administered 2019-03-31: 100 mL via INTRAVENOUS

## 2019-03-31 MED ORDER — VITAMIN B-1 100 MG PO TABS
100.0000 mg | ORAL_TABLET | Freq: Every day | ORAL | Status: DC
  Administered 2019-03-31 – 2019-04-01 (×2): 100 mg via ORAL
  Filled 2019-03-31: qty 1

## 2019-03-31 MED ORDER — SODIUM CHLORIDE 0.9 % IV SOLN: INTRAVENOUS | Status: DC

## 2019-03-31 MED ORDER — MORPHINE SULFATE (PF) 2 MG/ML IV SOLN
2.0000 mg | INTRAVENOUS | Status: DC | PRN
  Administered 2019-03-31 – 2019-04-04 (×21): 2 mg via INTRAVENOUS
  Filled 2019-03-31 (×21): qty 1

## 2019-03-31 MED ORDER — ONDANSETRON HCL 4 MG/2ML IJ SOLN
4.0000 mg | Freq: Once | INTRAMUSCULAR | Status: AC
  Administered 2019-03-31: 4 mg via INTRAVENOUS
  Filled 2019-03-31: qty 2
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
## 2019-03-31 NOTE — ED Triage Notes (Signed)
Pt here with possible pancreatitis. Pt drinks abt an 18 pack a day. Pt states she has thrown up continuously since yesterday.

## 2019-03-31 NOTE — Progress Notes (Signed)
New Admission Note:   Arrival Method: Bed  Mental Orientation: Alert and oriented  Telemetry: Assessment: Completed Skin: IV: RightForearm  Pain: 8/10  Tubes: none  Safety Measures: Safety Fall Prevention Plan has been given, discussed and signed Admission: Completed [facility] Orientation: Patient has been orientated to the room, unit and staff.  Family: none   Orders have been reviewed and implemented. Will continue to monitor the patient. Call light has been placed within reach and bed alarm has been activated.   Asa Fath RN [facility] Phone: [phone]

## 2019-03-31 NOTE — ED Notes (Signed)
Patient transported to CT 
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
## 2019-03-31 NOTE — Progress Notes (Signed)
Spoke with MD she gave verbal order to discontinue suicide precaution. Patient says she has no intention of hurting herself. Will continue to monitor patient.

## 2019-04-01 DIAGNOSIS — F1210 Cannabis abuse, uncomplicated: Secondary | ICD-10-CM

## 2019-04-01 DIAGNOSIS — K852 Alcohol induced acute pancreatitis without necrosis or infection: Principal | ICD-10-CM

## 2019-04-01 LAB — COMPREHENSIVE METABOLIC PANEL
ALT: 32 U/L (ref 0–44)
AST: 63 U/L — ABNORMAL HIGH (ref 15–41)
Albumin: 3.9 g/dL (ref 3.5–5.0)
Alkaline Phosphatase: 44 U/L (ref 38–126)
Anion gap: 12 (ref 5–15)
BUN: <5 mg/dL — ABNORMAL LOW (ref 6–20)
CO2: 26 mmol/L (ref 22–32)
Calcium: 7.9 mg/dL — ABNORMAL LOW (ref 8.9–10.3)
Chloride: 98 mmol/L (ref 98–111)
Creatinine, Ser: 0.75 mg/dL (ref 0.44–1.00)
GFR calc Af Amer: >60 mL/min (ref >60)
GFR calc non Af Amer: >60 mL/min (ref >60)
Glucose, Bld: 67 mg/dL — ABNORMAL LOW (ref 70–99)
Potassium: 4.9 mmol/L (ref 3.5–5.1)
Sodium: 136 mmol/L (ref 135–145)
Total Bilirubin: 1.6 mg/dL — ABNORMAL HIGH (ref 0.3–1.2)
Total Protein: 6.6 g/dL (ref 6.5–8.1)

## 2019-04-01 LAB — CBC
HCT: 41.4 % (ref 36.0–46.0)
Hemoglobin: 13.7 g/dL (ref 12.0–15.0)
MCH: 32.3 pg (ref 26.0–34.0)
MCHC: 33.1 g/dL (ref 30.0–36.0)
MCV: 97.6 fL (ref 80.0–100.0)
Platelets: 47 10*3/uL — ABNORMAL LOW (ref 150–400)
RBC: 4.24 MIL/uL (ref 3.87–5.11)
RDW: 14 % (ref 11.5–15.5)
WBC: 7 10*3/uL (ref 4.0–10.5)
nRBC: 0 % (ref 0.0–0.2)

## 2019-04-01 LAB — SARS CORONAVIRUS 2 (TAT 6-24 HRS): SARS Coronavirus 2: NEGATIVE

## 2019-04-01 LAB — GLUCOSE, CAPILLARY
Glucose-Capillary: 133 mg/dL — ABNORMAL HIGH (ref 70–99)
Glucose-Capillary: 57 mg/dL — ABNORMAL LOW (ref 70–99)
Glucose-Capillary: 58 mg/dL — ABNORMAL LOW (ref 70–99)
Glucose-Capillary: 66 mg/dL — ABNORMAL LOW (ref 70–99)
Glucose-Capillary: 67 mg/dL — ABNORMAL LOW (ref 70–99)
Glucose-Capillary: 90 mg/dL (ref 70–99)

## 2019-04-01 LAB — MAGNESIUM: Magnesium: 1.8 mg/dL (ref 1.7–2.4)

## 2019-04-01 MED ORDER — LORAZEPAM 2 MG/ML IJ SOLN: 0.0000 mg | Freq: Four times a day (QID) | INTRAMUSCULAR | Status: DC

## 2019-04-01 MED ORDER — DEXTROSE 50 % IV SOLN
INTRAVENOUS | Status: AC
  Administered 2019-04-01: 50 mL
  Filled 2019-04-01: qty 50

## 2019-04-01 MED ORDER — LORAZEPAM 1 MG PO TABS: 1.0000 mg | ORAL_TABLET | Freq: Four times a day (QID) | ORAL | Status: DC | PRN

## 2019-04-01 MED ORDER — LORAZEPAM 2 MG/ML IJ SOLN
1.0000 mg | Freq: Four times a day (QID) | INTRAMUSCULAR | Status: DC | PRN
  Administered 2019-04-01 – 2019-04-02 (×3): 1 mg via INTRAVENOUS
  Filled 2019-04-01 (×3): qty 1

## 2019-04-01 MED ORDER — ACETAMINOPHEN 325 MG PO TABS
650.0000 mg | ORAL_TABLET | Freq: Four times a day (QID) | ORAL | Status: DC | PRN
  Administered 2019-04-01 – 2019-04-02 (×2): 650 mg via ORAL
  Filled 2019-04-01 (×2): qty 2

## 2019-04-01 MED ORDER — LORAZEPAM 1 MG PO TABS: 0.0000 mg | ORAL_TABLET | Freq: Four times a day (QID) | ORAL | Status: DC

## 2019-04-01 NOTE — Progress Notes (Signed)
PROGRESS NOTE    Olivia Lester  [id number] DOB: [birth year] DOA: 03/31/2019 PCP: Soyla Dryer, PA-C  Brief Narrative: 43 year old female with history of alcohol abuse, polysubstance abuse, hepatitis C, chronic back pain recently hospitalized with acute alcoholic pancreatitis, left AMA on 8/18, continues to drink alcohol excessively and uses marijuana and cocaine, presented back to the emergency room yesterday evening with severe epigastric pain, nausea and vomiting, CT scan showed pancreatitis with elevated lipase and amylase as well and admitted   Assessment & Plan:   Acute alcoholic pancreatitis -Left AMA following recent hospitalization for same, MRCP few weeks ago did not show gallstones or dilated CBD -Will allow ice chips and sips of water today, continue IV fluids, supportive care -Labs in a.m. -Counseled regarding alcohol cessation  Alcohol abuse -Monitor for withdrawal, continue thiamine, Ativan per CIWA protocol  Polysubstance abuse -Namely alcohol, marijuana, tobacco and cocaine -Counseled  Severe protein calorie malnutrition -Multifactorial from all of the above  Chronic thrombocytopenia -Likely from EtOH abuse, monitor  DVT prophylaxis: SCDs Code Status: Full code Family Communication: No family at bedside Disposition Plan: Home pending clinical improvement  Consultants:      Procedures:   Antimicrobials:    Subjective: -Continues to complain of abdominal pain, appears to be resting comfortably, denies any vomiting today, nausea reported, reports last episode was of vomiting was yesterday, drinks 6-8 beers a day and did this after recent hospitalization  Objective: Vitals:   03/31/19 1805 03/31/19 2130 04/01/19 0515 04/01/19 0900  BP: (!) 166/107 (!) 176/117 (!) 170/95 (!) 154/105  Pulse: 84 92 83 96  Resp: 18 18 18 18   Temp: 97.9 F (36.6 C) 98.7 F (37.1 C) 98 F (36.7 C) 98.3 F (36.8 C)  TempSrc: Oral  Oral Oral  SpO2: 95% 100% 100% 100%   Weight:  50.6 kg    Height:        Intake/Output Summary (Last 24 hours) at 04/01/2019 1204 Last data filed at 04/01/2019 0200 Gross per 24 hour  Intake 928.15 ml  Output 0 ml  Net 928.15 ml   Filed Weights   03/31/19 1800 03/31/19 2130  Weight: 50.6 kg 50.6 kg    Examination:  General exam: Extremely cachectic chronically ill female laying in bed, AAO x3, no distress Respiratory system: Poor air movement bilaterally Cardiovascular system: S1 & S2 heard, RRR Gastrointestinal system: Soft, epigastric tenderness, bowel sounds decreased, no rigidity or rebound Central nervous system: Alert and oriented. No focal neurological deficits.  No asterixis, mild tremors noted Extremities: No edema Skin: No rashes, lesions or ulcers Psychiatry: Flat affect.     Data Reviewed:   CBC: Recent Labs  Lab 03/30/19 0801 03/31/19 1253 03/31/19 1815 04/01/19 0446  WBC 6.6 10.6* 8.3 7.0  NEUTROABS 2.9 7.9*  --   --   HGB 15.3* 14.9 14.5 13.7  HCT 44.8 42.5 43.1 41.4  MCV 95.7 93.4 95.8 97.6  PLT 102* 83* 67* 47*   Basic Metabolic Panel: Recent Labs  Lab 03/30/19 0801 03/31/19 1253 03/31/19 1815 04/01/19 0446  NA 139 140  --  136  K 3.8 3.3*  --  4.9  CL 103 99  --  98  CO2 23 24  --  26  GLUCOSE 93 93  --  67*  BUN 8 5*  --  <5*  CREATININE 0.53 0.70 0.54 0.75  CALCIUM 8.3* 9.1  --  7.9*  MG  --   --  1.7  --   PHOS  --   --  2.8  --    GFR: Estimated Creatinine Clearance: 73.2 mL/min (by C-G formula based on SCr of 0.75 mg/dL). Liver Function Tests: Recent Labs  Lab 03/30/19 0801 03/31/19 1253 04/01/19 0446  AST 91* 83* 63*  ALT 41 39 32  ALKPHOS 49 49 44  BILITOT 0.5 0.7 1.6*  PROT 7.5 7.8 6.6  ALBUMIN 4.4 4.5 3.9   Recent Labs  Lab 03/30/19 0801 03/31/19 1253  LIPASE 303* 347*  AMYLASE  --  185*   No results for input(s): AMMONIA in the last 168 hours. Coagulation Profile: No results for input(s): INR, PROTIME in the last 168 hours. Cardiac Enzymes:  No results for input(s): CKTOTAL, CKMB, CKMBINDEX, TROPONINI in the last 168 hours. BNP (last 3 results) No results for input(s): PROBNP in the last 8760 hours. HbA1C: No results for input(s): HGBA1C in the last 72 hours. CBG: Recent Labs  Lab 04/01/19 0719 04/01/19 0857 04/01/19 1120  GLUCAP 58* 133* 67*   Lipid Profile: No results for input(s): CHOL, HDL, LDLCALC, TRIG, CHOLHDL, LDLDIRECT in the last 72 hours. Thyroid Function Tests: No results for input(s): TSH, T4TOTAL, FREET4, T3FREE, THYROIDAB in the last 72 hours. Anemia Panel: No results for input(s): VITAMINB12, FOLATE, FERRITIN, TIBC, IRON, RETICCTPCT in the last 72 hours. Urine analysis:    Component Value Date/Time   COLORURINE STRAW (A) 03/31/2019 1312   APPEARANCEUR CLEAR 03/31/2019 1312   APPEARANCEUR Clear 12/07/2013 0133   LABSPEC 1.005 03/31/2019 1312   LABSPEC 1.006 12/07/2013 0133   PHURINE 7.0 03/31/2019 1312   GLUCOSEU NEGATIVE 03/31/2019 1312   GLUCOSEU Negative 12/07/2013 0133   HGBUR NEGATIVE 03/31/2019 1312   BILIRUBINUR NEGATIVE 03/31/2019 1312   BILIRUBINUR Negative 12/07/2013 0133   KETONESUR NEGATIVE 03/31/2019 1312   PROTEINUR NEGATIVE 03/31/2019 1312   UROBILINOGEN 0.2 04/28/2015 1559   NITRITE NEGATIVE 03/31/2019 1312   LEUKOCYTESUR NEGATIVE 03/31/2019 1312   LEUKOCYTESUR Negative 12/07/2013 0133   Sepsis Labs: @LABRCNTIP (procalcitonin:4,lacticidven:4)  ) Recent Results (from the past 240 hour(s))  SARS CORONAVIRUS 2 (TAT 6-24 HRS) Nasopharyngeal Nasopharyngeal Swab     Status: None   Collection Time: 03/31/19  5:36 PM   Specimen: Nasopharyngeal Swab  Result Value Ref Range Status   SARS Coronavirus 2 NEGATIVE NEGATIVE Final    Comment: (NOTE) SARS-CoV-2 target nucleic acids are NOT DETECTED. The SARS-CoV-2 RNA is generally detectable in upper and lower respiratory specimens during the acute phase of infection. Negative results do not preclude SARS-CoV-2 infection, do not rule  out co-infections with other pathogens, and should not be used as the sole basis for treatment or other patient management decisions. Negative results must be combined with clinical observations, patient history, and epidemiological information. The expected result is Negative. Fact Sheet for Patients: SugarRoll.be Fact Sheet for Healthcare Providers: https://www.woods-mathews.com/ This test is not yet approved or cleared by the [place] FDA and  has been authorized for detection and/or diagnosis of SARS-CoV-2 by FDA under an Emergency Use Authorization (EUA). This EUA will remain  in effect (meaning this test can be used) for the duration of the COVID-19 declaration under Section 56 4(b)(1) of the Act, 21 U.S.C. section 360bbb-3(b)(1), unless the authorization is terminated or revoked sooner. Performed at [facility], [place] [address], [place], [place] [address]          Radiology Studies: Dg Ribs Unilateral W/chest Left  Result Date: 03/31/2019 CLINICAL DATA:  Continue is vomiting since yesterday. Left chest pain. EXAM: LEFT RIBS AND CHEST - 3+  VIEW COMPARISON:  Single-view of the chest and right ribs 03/30/2019. Single-view of the chest 04/28/2015. FINDINGS: No fracture or other bone lesions are seen involving the ribs. There is no evidence of pneumothorax or pleural effusion. Both lungs are clear. Heart size and mediastinal contours are within normal limits. IMPRESSION: Negative. Electronically Signed   By: Inge Rise M.D.   On: 03/31/2019 13:52   Ct Abdomen Pelvis W Contrast  Result Date: 03/31/2019 CLINICAL DATA:  Patient here with possible pancreatitis. Continuous vomiting. EXAM: CT ABDOMEN AND PELVIS WITH CONTRAST TECHNIQUE: Multidetector CT imaging of the abdomen and pelvis was performed using the standard protocol following bolus administration of intravenous contrast. CONTRAST:  171mL OMNIPAQUE IOHEXOL 300 MG/ML  SOLN  COMPARISON:  CT abdomen pelvis 03/10/2019 FINDINGS: Lower chest: No acute abnormality. Hepatobiliary: Hepatic steatosis. No focal liver abnormality is seen. No gallstones, gallbladder wall thickening. No intrahepatic biliary duct dilation. Stable prominent common bile duct measuring 8 mm in diameter. Pancreas: Persistent inflammatory changes involving the pancreatic head. Small amount of peripancreatic fluid without a defined collection. No pancreatic duct dilation. The pancreatic body and tail are normal in appearance. There are inflammatory changes extending along the second and third portions of the duodenum likely reactive duodenitis. Spleen: Normal in size without focal abnormality. Adrenals/Urinary Tract: Adrenal glands are unremarkable. Kidneys are normal, without renal calculi, focal lesion, or hydronephrosis. Bladder is unremarkable. Stomach/Bowel: Stomach is within normal limits. Appendix appears normal. No evidence of bowel obstruction. Vascular/Lymphatic: Mild atherosclerotic calcification in the distal aorta and bilateral common iliac arteries without aneurysm. Reproductive: 1.9 cm right ovarian dermoid. Uterus is unremarkable. Other: No abdominal wall hernia or abnormality. No abdominopelvic ascites. Musculoskeletal: Posterior lumbar fusion hardware. No acute finding. IMPRESSION: 1. Persistent inflammatory changes involving the pancreatic head and uncinate. No new peripancreatic collection or duct dilation. 2. Inflammatory changes extending along the second and third portions of the duodenum likely reactive duodenitis. 3. Hepatic steatosis. 4. 1.9 cm right ovarian dermoid. Aortic Atherosclerosis (ICD10-I70.0). Electronically Signed   By: Audie Pinto M.D.   On: 03/31/2019 15:37        Scheduled Meds: . folic acid  1 mg Oral Daily  . multivitamin with minerals  1 tablet Oral Daily  . pantoprazole (PROTONIX) IV  40 mg Intravenous Q24H  . thiamine  100 mg Oral Daily   Or  . thiamine  100  mg Intravenous Daily  . thiamine  100 mg Oral Daily   Continuous Infusions: . sodium chloride 125 mL/hr at 04/01/19 1127     LOS: 0 days    Time spent: 62min    Domenic Polite, MD [facility]  04/01/2019, 12:04 PM

## 2019-04-01 NOTE — Plan of Care (Signed)
  Problem: Education: Goal: Knowledge of General Education information will improve Description Including pain rating scale, medication(s)/side effects and non-pharmacologic comfort measures Outcome: Progressing   

## 2019-04-01 NOTE — Discharge Instructions (Signed)
Alcohol Abuse and Dependence Information, Adult Alcohol is a widely available drug. People drink alcohol in different amounts. People who drink alcohol very often and in large amounts often have problems during and after drinking. They may develop what is called an alcohol use disorder. There are two main types of alcohol use disorders:  Alcohol abuse. This is when you use alcohol too much or too often. You may use alcohol to make yourself feel happy or to reduce stress. You may have a hard time setting a limit on the amount you drink.  Alcohol dependence. This is when you use alcohol consistently for a period of time, and your body changes as a result. This can make it hard to stop drinking because you may start to feel sick or feel different when you do not use alcohol. These symptoms are known as withdrawal. How can alcohol abuse and dependence affect me? Alcohol abuse and dependence can have a negative effect on your life. Drinking too much can lead to addiction. You may feel like you need alcohol to function normally. You may drink alcohol before work in the morning, during the day, or as soon as you get home from work in the evening. These actions can result in:  Poor work performance.  Job loss.  Financial problems.  Car crashes or criminal charges from driving after drinking alcohol.  Problems in your relationships with friends and family.  Losing the trust and respect of coworkers, friends, and family. Drinking heavily over a long period of time can permanently damage your body and brain, and can cause lifelong health issues, such as:  Damage to your liver or pancreas.  Heart problems, high blood pressure, or stroke.  Certain cancers.  Decreased ability to fight infections.  Brain or nerve damage.  Depression.  Early (premature) death. If you are careless or you crave alcohol, it is easy to drink more than your body can handle (overdose). Alcohol overdose is a serious  situation that requires hospitalization. It may lead to permanent injuries or death. What can increase my risk?  Having a family history of alcohol abuse.  Having depression or other mental health conditions.  Beginning to drink at an early age.  Binge drinking often.  Experiencing trauma, stress, and an unstable home life during childhood.  Spending time with people who drink often. What actions can I take to prevent or manage alcohol abuse and dependence?  Do not drink alcohol if: ? Your health care provider tells you not to drink. ? You are pregnant, may be pregnant, or are planning to become pregnant.  If you drink alcohol: ? Limit how much you use to:  0-1 drink a day for women.  0-2 drinks a day for men. ? Be aware of how much alcohol is in your drink. In the [place] one drink equals one 12 oz bottle of beer (355 mL), one 5 oz glass of wine (148 mL), or one 1 oz glass of hard liquor (44 mL).  Stop drinking if you have been drinking too much. This can be very hard to do if you are used to abusing alcohol. If you begin to have withdrawal symptoms, talk with your health care provider or a person that you trust. These symptoms may include anxiety, shaky hands, headache, nausea, sweating, or not being able to sleep.  Choose to drink nonalcoholic beverages in social gatherings and places where there may be alcohol. Activity  Spend more time on activities that you enjoy that do   not involve alcohol, like hobbies or exercise.  Find healthy ways to cope with stress, such as exercise, meditation, or spending time with people you care about. General information  Talk to your family, coworkers, and friends about supporting you in your efforts to stop drinking. If they drink, ask them not to drink around you. Spend more time with people who do not drink alcohol.  If you think that you have an alcohol dependency problem: ? Tell friends or family about your concerns. ? Talk with your  health care provider or another health professional about where to get help. ? Work with a therapist and a chemical dependency counselor. ? Consider joining a support group for people who struggle with alcohol abuse and dependence. Where to find support   Your health care provider.  SMART Recovery: www.smartrecovery.org Therapy and support groups  Local treatment centers or chemical dependency counselors.  Local AA groups in your community: www.aa.org Where to find more information  Centers for Disease Control and Prevention: www.cdc.gov  National Institute on Alcohol Abuse and Alcoholism: www.niaaa.nih.gov  Alcoholics Anonymous (AA): www.aa.org Contact a health care provider if:  You drank more or for longer than you intended on more than one occasion.  You tried to stop drinking or to cut back on how much you drink, but you were not able to.  You often drink to the point of vomiting or passing out.  You want to drink so badly that you cannot think about anything else.  You have problems in your life due to drinking, but you continue to drink.  You keep drinking even though you feel anxious, depressed, or have experienced memory loss.  You have stopped doing the things you used to enjoy in order to drink.  You have to drink more than you used to in order to get the effect you want.  You experience anxiety, sweating, nausea, shakiness, and trouble sleeping when you try to stop drinking. Get help right away if:  You have thoughts about hurting yourself or others.  You have serious withdrawal symptoms, including: ? Confusion. ? Racing heart. ? High blood pressure. ? Fever. If you ever feel like you may hurt yourself or others, or have thoughts about taking your own life, get help right away. You can go to your nearest emergency department or call:  Your local emergency services (911 in the [place]  A suicide crisis helpline, such as the National Suicide Prevention  Lifeline at 1-800-273-8255. This is open 24 hours a day. Summary  Alcohol abuse and dependence can have a negative effect on your life. Drinking too much or too often can lead to addiction.  If you drink alcohol, limit how much you use.  If you are having trouble keeping your drinking under control, find ways to change your behavior. Hobbies, calming activities, exercise, or support groups can help.  If you feel you need help with changing your drinking habits, talk with your health care provider, a good friend, or a therapist, or go to an AA group. This information is not intended to replace advice given to you by your health care provider. Make sure you discuss any questions you have with your health care provider. Document Released: 07/05/2016 Document Revised: 10/30/2018 Document Reviewed: 09/18/2018 Elsevier Patient Education  2020 [employer] Inc.  

## 2019-04-01 NOTE — Progress Notes (Signed)
Initial Nutrition Assessment  DOCUMENTATION CODES:   Underweight  INTERVENTION:   -Diet advancement per MD -Once diet advanced, Ensure Enlive po BID, each supplement provides 350 kcal and 20 grams of protein -Multivitamin with minerals daily  NUTRITION DIAGNOSIS:   Increased nutrient needs related to (ETOH pancreatitis) as evidenced by estimated needs.  GOAL:   Patient will meet greater than or equal to 90% of their needs  MONITOR:   Diet advancement, Labs, Weight trends, I & O's  REASON FOR ASSESSMENT:   Malnutrition Screening Tool    ASSESSMENT:   43 year old female with history of alcohol abuse, polysubstance abuse, hepatitis C, chronic back pain recently hospitalized with acute alcoholic pancreatitis, left AMA on 8/18, continues to drink alcohol excessively and uses marijuana and cocaine, presented back to the emergency room yesterday evening with severe epigastric pain, nausea and vomiting, CT scan showed pancreatitis with elevated lipase and amylase as well and admitted  **RD working remotely**  Patient currently NPO. Pt reports having abdominal pain and some nausea today. Patient was seen by nutrition team during previous admission, pt was provided pancreatitis diet education at that time. Per chart review, pt continued to drink 6-8 beers daily following that admission. Suspect that PO intake continued to be poor as well.  Once diet is advanced, recommend Ensure supplements.   Suspect some degree of malnutrition given poor PO and ETOH abuse.   Per weight records, pt's weight has tended to fluctuate between 105-124 lbs this year.  Current weight 111 lbs. I/Os: +928 ml since admit  Medications: Folic acid tablet daily, Multivitamin with minerals daily, Thiamine tablet daily, Zofran PRN Labs reviewed: CBGs: 67-133  NUTRITION - FOCUSED PHYSICAL EXAM:  Unable to perform -working remotely.  Diet Order:   Diet Order            Diet NPO time specified Except  for: Ice Chips  Diet effective now              EDUCATION NEEDS:   No education needs have been identified at this time  Skin:  Skin Assessment: Reviewed RN Assessment  Last BM:  8/29  Height:   Ht Readings from Last 1 Encounters:  03/31/19 5\' 7"  (1.702 m)    Weight:   Wt Readings from Last 1 Encounters:  03/31/19 50.6 kg    Ideal Body Weight:  61.3 kg  BMI:  Body mass index is 17.47 kg/m.  Estimated Nutritional Needs:   Kcal:  1550-1750  Protein:  75-85g  Fluid:  1.9L/day  Clayton Bibles, MS, RD, LDN Inpatient Clinical Dietitian Pager: [phone] After Hours Pager: [phone]

## 2019-04-02 DIAGNOSIS — K8500 Idiopathic acute pancreatitis without necrosis or infection: Secondary | ICD-10-CM

## 2019-04-02 LAB — CBC
HCT: 41.4 % (ref 36.0–46.0)
Hemoglobin: 13.8 g/dL (ref 12.0–15.0)
MCH: 32.1 pg (ref 26.0–34.0)
MCHC: 33.3 g/dL (ref 30.0–36.0)
MCV: 96.3 fL (ref 80.0–100.0)
Platelets: 48 10*3/uL — ABNORMAL LOW (ref 150–400)
RBC: 4.3 MIL/uL (ref 3.87–5.11)
RDW: 13.3 % (ref 11.5–15.5)
WBC: 5.2 10*3/uL (ref 4.0–10.5)
nRBC: 0 % (ref 0.0–0.2)

## 2019-04-02 LAB — COMPREHENSIVE METABOLIC PANEL
ALT: 30 U/L (ref 0–44)
AST: 52 U/L — ABNORMAL HIGH (ref 15–41)
Albumin: 3.7 g/dL (ref 3.5–5.0)
Alkaline Phosphatase: 43 U/L (ref 38–126)
Anion gap: 9 (ref 5–15)
BUN: <5 mg/dL — ABNORMAL LOW (ref 6–20)
CO2: 27 mmol/L (ref 22–32)
Calcium: 8.6 mg/dL — ABNORMAL LOW (ref 8.9–10.3)
Chloride: 99 mmol/L (ref 98–111)
Creatinine, Ser: 0.52 mg/dL (ref 0.44–1.00)
GFR calc Af Amer: >60 mL/min (ref >60)
GFR calc non Af Amer: >60 mL/min (ref >60)
Glucose, Bld: 124 mg/dL — ABNORMAL HIGH (ref 70–99)
Potassium: 3.4 mmol/L — ABNORMAL LOW (ref 3.5–5.1)
Sodium: 135 mmol/L (ref 135–145)
Total Bilirubin: 0.7 mg/dL (ref 0.3–1.2)
Total Protein: 6.4 g/dL — ABNORMAL LOW (ref 6.5–8.1)

## 2019-04-02 LAB — GLUCOSE, CAPILLARY
Glucose-Capillary: 104 mg/dL — ABNORMAL HIGH (ref 70–99)
Glucose-Capillary: 155 mg/dL — ABNORMAL HIGH (ref 70–99)
Glucose-Capillary: 159 mg/dL — ABNORMAL HIGH (ref 70–99)
Glucose-Capillary: 173 mg/dL — ABNORMAL HIGH (ref 70–99)
Glucose-Capillary: 60 mg/dL — ABNORMAL LOW (ref 70–99)
Glucose-Capillary: 71 mg/dL (ref 70–99)
Glucose-Capillary: 87 mg/dL (ref 70–99)

## 2019-04-02 LAB — LIPASE, BLOOD: Lipase: 88 U/L — ABNORMAL HIGH (ref 11–51)

## 2019-04-02 LAB — HIV ANTIBODY (ROUTINE TESTING W REFLEX): HIV Screen 4th Generation wRfx: NONREACTIVE

## 2019-04-02 MED ORDER — LORAZEPAM 1 MG PO TABS
1.0000 mg | ORAL_TABLET | Freq: Four times a day (QID) | ORAL | Status: DC | PRN
  Administered 2019-04-03: 1 mg via ORAL
  Filled 2019-04-02: qty 1

## 2019-04-02 MED ORDER — VITAMIN B-1 100 MG PO TABS: 100.0000 mg | ORAL_TABLET | Freq: Every day | ORAL | Status: DC

## 2019-04-02 MED ORDER — LORAZEPAM 2 MG/ML IJ SOLN
1.0000 mg | Freq: Four times a day (QID) | INTRAMUSCULAR | Status: DC | PRN
  Administered 2019-04-03: 1 mg via INTRAVENOUS
  Filled 2019-04-02: qty 1

## 2019-04-02 MED ORDER — ADULT MULTIVITAMIN W/MINERALS CH: 1.0000 | ORAL_TABLET | Freq: Every day | ORAL | Status: DC

## 2019-04-02 MED ORDER — POTASSIUM CHLORIDE 2 MEQ/ML IV SOLN
INTRAVENOUS | Status: DC
  Administered 2019-04-02 – 2019-04-04 (×5): via INTRAVENOUS
  Filled 2019-04-02 (×6): qty 1000

## 2019-04-02 MED ORDER — DEXTROSE 50 % IV SOLN
12.5000 g | INTRAVENOUS | Status: AC
  Administered 2019-04-02: 12.5 g via INTRAVENOUS
  Filled 2019-04-02: qty 50

## 2019-04-02 MED ORDER — LORAZEPAM 2 MG/ML IJ SOLN: 0.0000 mg | Freq: Two times a day (BID) | INTRAMUSCULAR | Status: DC

## 2019-04-02 MED ORDER — FOLIC ACID 1 MG PO TABS: 1.0000 mg | ORAL_TABLET | Freq: Every day | ORAL | Status: DC

## 2019-04-02 MED ORDER — POTASSIUM CHLORIDE 2 MEQ/ML IV SOLN: INTRAVENOUS | Status: DC

## 2019-04-02 MED ORDER — THIAMINE HCL 100 MG/ML IJ SOLN: 100.0000 mg | Freq: Every day | INTRAMUSCULAR | Status: DC

## 2019-04-02 MED ORDER — LORAZEPAM 2 MG/ML IJ SOLN
0.0000 mg | Freq: Four times a day (QID) | INTRAMUSCULAR | Status: DC
  Administered 2019-04-02 (×2): 2 mg via INTRAVENOUS
  Filled 2019-04-02 (×2): qty 1

## 2019-04-02 NOTE — Progress Notes (Signed)
PROGRESS NOTE    Olivia Lester  [id number] DOB: [birth year] DOA: 03/31/2019 PCP: Soyla Dryer, PA-C  Brief Narrative: 43 year old female with history of alcohol abuse, polysubstance abuse, hepatitis C, chronic back pain recently hospitalized with acute alcoholic pancreatitis, left AMA on 8/18, continues to drink alcohol excessively and uses marijuana and cocaine, presented back to the emergency room yesterday evening with severe epigastric pain, nausea and vomiting, CT scan showed pancreatitis with elevated lipase and amylase as well and admitted   Assessment & Plan:   Acute alcoholic pancreatitis -Left AMA following recent hospitalization for same, MRCP few weeks ago did not show gallstones or dilated CBD -Will try clear liquids today, continue IV fluids, supportive care -Labs in a.m. -Counseled regarding alcohol cessation  Alcohol abuse -She is tremulous, but no signs of delirium. Her last drink was 48 hours ago. Continue to monitor on CIWA protocol  Polysubstance abuse -Namely alcohol, marijuana, tobacco and cocaine -Counseled  Severe protein calorie malnutrition -Multifactorial from all of the above  Chronic thrombocytopenia -Likely from EtOH abuse, monitor  DVT prophylaxis: SCDs Code Status: Full code Family Communication: No family at bedside Disposition Plan: Home pending clinical improvement  Consultants:      Procedures:   Antimicrobials:    Subjective: -continues to complain of abdominal pain. Having nausea and has some vomiting after some ice chips. Feels very tremulous  Objective: Vitals:   04/02/19 0815 04/02/19 0900 04/02/19 1838 04/02/19 2141  BP: (!) 154/105  (!) 136/99 (!) 151/109  Pulse: 88 76 81 77  Resp: 16  18 15   Temp: 98 F (36.7 C)  98.9 F (37.2 C) 97.6 F (36.4 C)  TempSrc: Oral  Oral Oral  SpO2: 100%  100% 100%  Weight:      Height:        Intake/Output Summary (Last 24 hours) at 04/02/2019 2200 Last data filed at 04/02/2019  1839 Gross per 24 hour  Intake 1833.96 ml  Output 2500 ml  Net -666.04 ml   Filed Weights   03/31/19 1800 03/31/19 2130 04/01/19 2103  Weight: 50.6 kg 50.6 kg 52.5 kg    Examination:  General exam: Alert, awake, oriented x 3 Respiratory system: Clear to auscultation. Respiratory effort normal. Cardiovascular system:RRR. No murmurs, rubs, gallops. Gastrointestinal system: Abdomen is nondistended, soft and tender in epigastrium. No organomegaly or masses felt. Normal bowel sounds heard. Central nervous system: Alert and oriented. No focal neurological deficits. She is tremulous Extremities: No C/C/E, +pedal pulses Skin: No rashes, lesions or ulcers Psychiatry: Judgement and insight appear normal. Mood & affect appropriate.    Data Reviewed:   CBC: Recent Labs  Lab 03/30/19 0801 03/31/19 1253 03/31/19 1815 04/01/19 0446 04/02/19 0531  WBC 6.6 10.6* 8.3 7.0 5.2  NEUTROABS 2.9 7.9*  --   --   --   HGB 15.3* 14.9 14.5 13.7 13.8  HCT 44.8 42.5 43.1 41.4 41.4  MCV 95.7 93.4 95.8 97.6 96.3  PLT 102* 83* 67* 47* 48*   Basic Metabolic Panel: Recent Labs  Lab 03/30/19 0801 03/31/19 1253 03/31/19 1815 04/01/19 0446 04/01/19 1640 04/02/19 0531  NA 139 140  --  136  --  135  K 3.8 3.3*  --  4.9  --  3.4*  CL 103 99  --  98  --  99  CO2 23 24  --  26  --  27  GLUCOSE 93 93  --  67*  --  124*  BUN 8 5*  --  <5*  --  <  5*  CREATININE 0.53 0.70 0.54 0.75  --  0.52  CALCIUM 8.3* 9.1  --  7.9*  --  8.6*  MG  --   --  1.7  --  1.8  --   PHOS  --   --  2.8  --   --   --    GFR: Estimated Creatinine Clearance: 75.9 mL/min (by C-G formula based on SCr of 0.52 mg/dL). Liver Function Tests: Recent Labs  Lab 03/30/19 0801 03/31/19 1253 04/01/19 0446 04/02/19 0531  AST 91* 83* 63* 52*  ALT 41 39 32 30  ALKPHOS 49 49 44 43  BILITOT 0.5 0.7 1.6* 0.7  PROT 7.5 7.8 6.6 6.4*  ALBUMIN 4.4 4.5 3.9 3.7   Recent Labs  Lab 03/30/19 0801 03/31/19 1253 04/02/19 0531  LIPASE  303* 347* 88*  AMYLASE  --  185*  --    No results for input(s): AMMONIA in the last 168 hours. Coagulation Profile: No results for input(s): INR, PROTIME in the last 168 hours. Cardiac Enzymes: No results for input(s): CKTOTAL, CKMB, CKMBINDEX, TROPONINI in the last 168 hours. BNP (last 3 results) No results for input(s): PROBNP in the last 8760 hours. HbA1C: No results for input(s): HGBA1C in the last 72 hours. CBG: Recent Labs  Lab 04/02/19 0455 04/02/19 0731 04/02/19 1135 04/02/19 1707 04/02/19 2143  GLUCAP 173* 71 159* 104* 87   Lipid Profile: No results for input(s): CHOL, HDL, LDLCALC, TRIG, CHOLHDL, LDLDIRECT in the last 72 hours. Thyroid Function Tests: No results for input(s): TSH, T4TOTAL, FREET4, T3FREE, THYROIDAB in the last 72 hours. Anemia Panel: No results for input(s): VITAMINB12, FOLATE, FERRITIN, TIBC, IRON, RETICCTPCT in the last 72 hours. Urine analysis:    Component Value Date/Time   COLORURINE STRAW (A) 03/31/2019 1312   APPEARANCEUR CLEAR 03/31/2019 1312   APPEARANCEUR Clear 12/07/2013 0133   LABSPEC 1.005 03/31/2019 1312   LABSPEC 1.006 12/07/2013 0133   PHURINE 7.0 03/31/2019 1312   GLUCOSEU NEGATIVE 03/31/2019 1312   GLUCOSEU Negative 12/07/2013 0133   HGBUR NEGATIVE 03/31/2019 1312   BILIRUBINUR NEGATIVE 03/31/2019 1312   BILIRUBINUR Negative 12/07/2013 0133   KETONESUR NEGATIVE 03/31/2019 1312   PROTEINUR NEGATIVE 03/31/2019 1312   UROBILINOGEN 0.2 04/28/2015 1559   NITRITE NEGATIVE 03/31/2019 1312   LEUKOCYTESUR NEGATIVE 03/31/2019 1312   LEUKOCYTESUR Negative 12/07/2013 0133   Sepsis Labs: @LABRCNTIP (procalcitonin:4,lacticidven:4)  ) Recent Results (from the past 240 hour(s))  SARS CORONAVIRUS 2 (TAT 6-24 HRS) Nasopharyngeal Nasopharyngeal Swab     Status: None   Collection Time: 03/31/19  5:36 PM   Specimen: Nasopharyngeal Swab  Result Value Ref Range Status   SARS Coronavirus 2 NEGATIVE NEGATIVE Final    Comment: (NOTE)  SARS-CoV-2 target nucleic acids are NOT DETECTED. The SARS-CoV-2 RNA is generally detectable in upper and lower respiratory specimens during the acute phase of infection. Negative results do not preclude SARS-CoV-2 infection, do not rule out co-infections with other pathogens, and should not be used as the sole basis for treatment or other patient management decisions. Negative results must be combined with clinical observations, patient history, and epidemiological information. The expected result is Negative. Fact Sheet for Patients: SugarRoll.be Fact Sheet for Healthcare Providers: https://www.woods-mathews.com/ This test is not yet approved or cleared by the [place] FDA and  has been authorized for detection and/or diagnosis of SARS-CoV-2 by FDA under an Emergency Use Authorization (EUA). This EUA will remain  in effect (meaning this test can be used) for the duration of  the COVID-19 declaration under Section 56 4(b)(1) of the Act, 21 U.S.C. section 360bbb-3(b)(1), unless the authorization is terminated or revoked sooner. Performed at [facility], [place] [address], [place], [place] [address]          Radiology Studies: No results found.      Scheduled Meds: . folic acid  1 mg Oral Daily  . LORazepam  0-4 mg Intravenous Q6H   Followed by  . [START ON 04/04/2019] LORazepam  0-4 mg Intravenous Q12H  . multivitamin with minerals  1 tablet Oral Daily  . pantoprazole (PROTONIX) IV  40 mg Intravenous Q24H  . thiamine  100 mg Oral Daily   Or  . thiamine  100 mg Intravenous Daily   Continuous Infusions: . lactated ringers with kcl 125 mL/hr at 04/02/19 1813     LOS: 1 day    Time spent: 3min    Kathie Dike, MD [facility]  04/02/2019, 10:00 PM

## 2019-04-02 NOTE — Progress Notes (Signed)
Hypoglycemic Event  CBG: Results for Olivia Lester, Olivia Lester (MRN [id number]) as of 04/02/2019 00:20  Ref. Range 04/01/2019 23:47  Glucose-Capillary Latest Ref Range: 70 - 99 mg/dL 57 (L)    Treatment: D50 25 mL (12.5 gm)  Symptoms: None  Follow-up CBG: Time: CBG Result: Results for Olivia Lester, Olivia Lester (MRN [id number]) as of 04/02/2019 00:29  Ref. Range 04/02/2019 00:24  Glucose-Capillary Latest Ref Range: 70 - 99 mg/dL 155 ([name])    Possible Reasons for Event: Inadequate meal intake  Comments/MD notified: Lamar Blinks, NP    Yina Riviere Nghus  [place]

## 2019-04-02 NOTE — Progress Notes (Signed)
Hypoglycemic Event  CBG: 60  Treatment: D50 25 mL (12.5 gm)  Symptoms: None  Follow-up CBG: Time: [id number] CBG Result: 173  Possible Reasons for Event: Inadequate meal intake      Shannel Zahm Nghus  [place]

## 2019-04-02 NOTE — Plan of Care (Signed)
  Problem: Activity: Goal: Risk for activity intolerance will decrease Outcome: Progressing   

## 2019-04-03 LAB — CBC
HCT: 43 % (ref 36.0–46.0)
Hemoglobin: 14.2 g/dL (ref 12.0–15.0)
MCH: 32.1 pg (ref 26.0–34.0)
MCHC: 33 g/dL (ref 30.0–36.0)
MCV: 97.3 fL (ref 80.0–100.0)
Platelets: 55 10*3/uL — ABNORMAL LOW (ref 150–400)
RBC: 4.42 MIL/uL (ref 3.87–5.11)
RDW: 13.2 % (ref 11.5–15.5)
WBC: 4.9 10*3/uL (ref 4.0–10.5)
nRBC: 0 % (ref 0.0–0.2)

## 2019-04-03 LAB — COMPREHENSIVE METABOLIC PANEL
ALT: 33 U/L (ref 0–44)
AST: 55 U/L — ABNORMAL HIGH (ref 15–41)
Albumin: 3.6 g/dL (ref 3.5–5.0)
Alkaline Phosphatase: 45 U/L (ref 38–126)
Anion gap: 11 (ref 5–15)
BUN: <5 mg/dL — ABNORMAL LOW (ref 6–20)
CO2: 25 mmol/L (ref 22–32)
Calcium: 9.1 mg/dL (ref 8.9–10.3)
Chloride: 99 mmol/L (ref 98–111)
Creatinine, Ser: 0.58 mg/dL (ref 0.44–1.00)
GFR calc Af Amer: >60 mL/min (ref >60)
GFR calc non Af Amer: >60 mL/min (ref >60)
Glucose, Bld: 86 mg/dL (ref 70–99)
Potassium: 3.9 mmol/L (ref 3.5–5.1)
Sodium: 135 mmol/L (ref 135–145)
Total Bilirubin: 0.6 mg/dL (ref 0.3–1.2)
Total Protein: 6.3 g/dL — ABNORMAL LOW (ref 6.5–8.1)

## 2019-04-03 LAB — GLUCOSE, CAPILLARY
Glucose-Capillary: 84 mg/dL (ref 70–99)
Glucose-Capillary: 95 mg/dL (ref 70–99)
Glucose-Capillary: 95 mg/dL (ref 70–99)

## 2019-04-03 LAB — LIPASE, BLOOD: Lipase: 64 U/L — ABNORMAL HIGH (ref 11–51)

## 2019-04-03 MED ORDER — LORAZEPAM 2 MG/ML IJ SOLN: 1.0000 mg | INTRAMUSCULAR | Status: DC | PRN

## 2019-04-03 MED ORDER — FOLIC ACID 1 MG PO TABS: 1.0000 mg | ORAL_TABLET | Freq: Every day | ORAL | Status: DC

## 2019-04-03 MED ORDER — NICOTINE 21 MG/24HR TD PT24
21.0000 mg | MEDICATED_PATCH | Freq: Every day | TRANSDERMAL | Status: DC
  Administered 2019-04-03 – 2019-04-04 (×2): 21 mg via TRANSDERMAL
  Filled 2019-04-03 (×2): qty 1

## 2019-04-03 MED ORDER — ADULT MULTIVITAMIN W/MINERALS CH: 1.0000 | ORAL_TABLET | Freq: Every day | ORAL | Status: DC

## 2019-04-03 MED ORDER — LORAZEPAM 2 MG/ML IJ SOLN: 0.0000 mg | Freq: Two times a day (BID) | INTRAMUSCULAR | Status: DC

## 2019-04-03 MED ORDER — THIAMINE HCL 100 MG/ML IJ SOLN: 100.0000 mg | Freq: Every day | INTRAMUSCULAR | Status: DC

## 2019-04-03 MED ORDER — CHLORDIAZEPOXIDE HCL 5 MG PO CAPS
10.0000 mg | ORAL_CAPSULE | Freq: Four times a day (QID) | ORAL | Status: DC
  Administered 2019-04-03 – 2019-04-04 (×5): 10 mg via ORAL
  Filled 2019-04-03 (×5): qty 2

## 2019-04-03 MED ORDER — LORAZEPAM 1 MG PO TABS: 1.0000 mg | ORAL_TABLET | ORAL | Status: DC | PRN

## 2019-04-03 MED ORDER — LORAZEPAM 2 MG/ML IJ SOLN
0.0000 mg | Freq: Four times a day (QID) | INTRAMUSCULAR | Status: DC
  Administered 2019-04-04: 1 mg via INTRAVENOUS
  Filled 2019-04-03: qty 1

## 2019-04-03 MED ORDER — VITAMIN B-1 100 MG PO TABS: 100.0000 mg | ORAL_TABLET | Freq: Every day | ORAL | Status: DC

## 2019-04-03 NOTE — Progress Notes (Signed)
PROGRESS NOTE    Olivia Lester  [id number] DOB: [birth year] DOA: 03/31/2019 PCP: Soyla Dryer, PA-C   Brief Narrative:  43 year old female with history of alcohol abuse, polysubstance abuse, hepatitis C, chronic back pain recently hospitalized with acute alcoholic pancreatitis, left AMA on 8/18, continues to drink alcohol excessively and uses marijuana and cocaine, presented back to the emergency room yesterday evening with severe epigastric pain, nausea and vomiting, CT scan showed pancreatitis with elevated lipase and amylase as well and admitted   Subjective: -Still complaining of abdominal pain, nausea, and poor appetite, having tremors .  Assessment & Plan:   Acute alcoholic pancreatitis -She left AMA during recent hospitalization for the same, her MRCP few weeks ago did not show any gallstones or dilated CBD , -Continue with IV fluids, continue with PRN pain and nausea medication, she still with some abdominal pain, nausea, continue with clear liquid diet, with no advancement today . -She was counseled about alcohol cessation .  Alcohol abuse/mild withdrawals.  -She appears anxious, with tremors today, not delirious, reports she drinks on average 18 packs/day, continue with CIWA protocol, I have started on Librium 10 mg 4 times daily to prevent l further withdrawals.  Polysubstance abuse -Namely alcohol, marijuana, tobacco and cocaine -Counseled  Severe protein calorie malnutrition -Multifactorial from all of the above  Chronic thrombocytopenia -Likely from EtOH abuse, monitor  DVT prophylaxis: SCDs Code Status: Full code Family Communication: No family at bedside Disposition Plan: Home pending clinical improvement  Consultants:   None   Procedures:   Antimicrobials:    Objective: Vitals:   04/02/19 1838 04/02/19 2141 04/03/19 0508 04/03/19 0838  BP: (!) 136/99 (!) 151/109 (!) 148/93 (!) 133/110  Pulse: 81 77 73 100  Resp: 18 15 15 18   Temp: 98.9 F (37.2  C) 97.6 F (36.4 C) 98 F (36.7 C) 98.2 F (36.8 C)  TempSrc: Oral Oral Oral Oral  SpO2: 100% 100% 100% 100%  Weight:      Height:        Intake/Output Summary (Last 24 hours) at 04/03/2019 1130 Last data filed at 04/03/2019 1051 Gross per 24 hour  Intake 2910.52 ml  Output 2100 ml  Net 810.52 ml   Filed Weights   03/31/19 1800 03/31/19 2130 04/01/19 2103  Weight: 50.6 kg 50.6 kg 52.5 kg    Examination:  Awake Alert, Oriented X 3, No new F.N deficits, anxious, has tremors Symmetrical Chest wall movement, Good air movement bilaterally, CTAB RRR,No Gallops,Rubs or new Murmurs, No Parasternal Heave +ve B.Sounds, Abd Soft, has epigastric tenderness, No rebound - guarding or rigidity. No Cyanosis, Clubbing or edema, No new Rash or bruise      Data Reviewed:   CBC: Recent Labs  Lab 03/30/19 0801 03/31/19 1253 03/31/19 1815 04/01/19 0446 04/02/19 0531 04/03/19 0502  WBC 6.6 10.6* 8.3 7.0 5.2 4.9  NEUTROABS 2.9 7.9*  --   --   --   --   HGB 15.3* 14.9 14.5 13.7 13.8 14.2  HCT 44.8 42.5 43.1 41.4 41.4 43.0  MCV 95.7 93.4 95.8 97.6 96.3 97.3  PLT 102* 83* 67* 47* 48* 55*   Basic Metabolic Panel: Recent Labs  Lab 03/30/19 0801 03/31/19 1253 03/31/19 1815 04/01/19 0446 04/01/19 1640 04/02/19 0531 04/03/19 0502  NA 139 140  --  136  --  135 135  K 3.8 3.3*  --  4.9  --  3.4* 3.9  CL 103 99  --  98  --  99 99  CO2 23 24  --  26  --  27 25  GLUCOSE 93 93  --  67*  --  124* 86  BUN 8 5*  --  <5*  --  <5* <5*  CREATININE 0.53 0.70 0.54 0.75  --  0.52 0.58  CALCIUM 8.3* 9.1  --  7.9*  --  8.6* 9.1  MG  --   --  1.7  --  1.8  --   --   PHOS  --   --  2.8  --   --   --   --    GFR: Estimated Creatinine Clearance: 75.9 mL/min (by C-G formula based on SCr of 0.58 mg/dL). Liver Function Tests: Recent Labs  Lab 03/30/19 0801 03/31/19 1253 04/01/19 0446 04/02/19 0531 04/03/19 0502  AST 91* 83* 63* 52* 55*  ALT 41 39 32 30 33  ALKPHOS 49 49 44 43 45  BILITOT 0.5  0.7 1.6* 0.7 0.6  PROT 7.5 7.8 6.6 6.4* 6.3*  ALBUMIN 4.4 4.5 3.9 3.7 3.6   Recent Labs  Lab 03/30/19 0801 03/31/19 1253 04/02/19 0531 04/03/19 0502  LIPASE 303* 347* 88* 64*  AMYLASE  --  185*  --   --    No results for input(s): AMMONIA in the last 168 hours. Coagulation Profile: No results for input(s): INR, PROTIME in the last 168 hours. Cardiac Enzymes: No results for input(s): CKTOTAL, CKMB, CKMBINDEX, TROPONINI in the last 168 hours. BNP (last 3 results) No results for input(s): PROBNP in the last 8760 hours. HbA1C: No results for input(s): HGBA1C in the last 72 hours. CBG: Recent Labs  Lab 04/02/19 1135 04/02/19 1707 04/02/19 2143 04/03/19 0652 04/03/19 1123  GLUCAP 159* 104* 87 84 95   Lipid Profile: No results for input(s): CHOL, HDL, LDLCALC, TRIG, CHOLHDL, LDLDIRECT in the last 72 hours. Thyroid Function Tests: No results for input(s): TSH, T4TOTAL, FREET4, T3FREE, THYROIDAB in the last 72 hours. Anemia Panel: No results for input(s): VITAMINB12, FOLATE, FERRITIN, TIBC, IRON, RETICCTPCT in the last 72 hours. Urine analysis:    Component Value Date/Time   COLORURINE STRAW (A) 03/31/2019 1312   APPEARANCEUR CLEAR 03/31/2019 1312   APPEARANCEUR Clear 12/07/2013 0133   LABSPEC 1.005 03/31/2019 1312   LABSPEC 1.006 12/07/2013 0133   PHURINE 7.0 03/31/2019 1312   GLUCOSEU NEGATIVE 03/31/2019 1312   GLUCOSEU Negative 12/07/2013 0133   HGBUR NEGATIVE 03/31/2019 1312   BILIRUBINUR NEGATIVE 03/31/2019 1312   BILIRUBINUR Negative 12/07/2013 0133   KETONESUR NEGATIVE 03/31/2019 1312   PROTEINUR NEGATIVE 03/31/2019 1312   UROBILINOGEN 0.2 04/28/2015 1559   NITRITE NEGATIVE 03/31/2019 1312   LEUKOCYTESUR NEGATIVE 03/31/2019 1312   LEUKOCYTESUR Negative 12/07/2013 0133   Sepsis Labs: @LABRCNTIP (procalcitonin:4,lacticidven:4)  ) Recent Results (from the past 240 hour(s))  SARS CORONAVIRUS 2 (TAT 6-24 HRS) Nasopharyngeal Nasopharyngeal Swab     Status: None     Collection Time: 03/31/19  5:36 PM   Specimen: Nasopharyngeal Swab  Result Value Ref Range Status   SARS Coronavirus 2 NEGATIVE NEGATIVE Final    Comment: (NOTE) SARS-CoV-2 target nucleic acids are NOT DETECTED. The SARS-CoV-2 RNA is generally detectable in upper and lower respiratory specimens during the acute phase of infection. Negative results do not preclude SARS-CoV-2 infection, do not rule out co-infections with other pathogens, and should not be used as the sole basis for treatment or other patient management decisions. Negative results must be combined with clinical observations, patient history, and epidemiological information. The expected result is Negative.  Fact Sheet for Patients: SugarRoll.be Fact Sheet for Healthcare Providers: https://www.woods-mathews.com/ This test is not yet approved or cleared by the [place] FDA and  has been authorized for detection and/or diagnosis of SARS-CoV-2 by FDA under an Emergency Use Authorization (EUA). This EUA will remain  in effect (meaning this test can be used) for the duration of the COVID-19 declaration under Section 56 4(b)(1) of the Act, 21 U.S.C. section 360bbb-3(b)(1), unless the authorization is terminated or revoked sooner. Performed at [facility], [place] [address] [place], [place] [address]          Radiology Studies: No results found.      Scheduled Meds:  folic acid  1 mg Oral Daily   LORazepam  0-4 mg Intravenous Q6H   Followed by   Derrill Memo ON 04/04/2019] LORazepam  0-4 mg Intravenous Q12H   multivitamin with minerals  1 tablet Oral Daily   pantoprazole (PROTONIX) IV  40 mg Intravenous Q24H   thiamine  100 mg Oral Daily   Or   thiamine  100 mg Intravenous Daily   Continuous Infusions:  lactated ringers with kcl 125 mL/hr at 04/03/19 0948     LOS: 2 days    Phillips Climes  MD [facility]  04/03/2019, 11:30 AM

## 2019-04-03 NOTE — Plan of Care (Signed)
  Problem: Education: Goal: Knowledge of General Education information will improve Description: Including pain rating scale, medication(s)/side effects and non-pharmacologic comfort measures Outcome: Progressing   Problem: Clinical Measurements: Goal: Respiratory complications will improve Outcome: Completed/Met

## 2019-04-04 LAB — CBC
HCT: 43 % (ref 36.0–46.0)
Hemoglobin: 14 g/dL (ref 12.0–15.0)
MCH: 32.1 pg (ref 26.0–34.0)
MCHC: 32.6 g/dL (ref 30.0–36.0)
MCV: 98.6 fL (ref 80.0–100.0)
Platelets: 68 10*3/uL — ABNORMAL LOW (ref 150–400)
RBC: 4.36 MIL/uL (ref 3.87–5.11)
RDW: 13.4 % (ref 11.5–15.5)
WBC: 5.4 10*3/uL (ref 4.0–10.5)
nRBC: 0 % (ref 0.0–0.2)

## 2019-04-04 LAB — COMPREHENSIVE METABOLIC PANEL
ALT: 44 U/L (ref 0–44)
AST: 62 U/L — ABNORMAL HIGH (ref 15–41)
Albumin: 3.9 g/dL (ref 3.5–5.0)
Alkaline Phosphatase: 39 U/L (ref 38–126)
Anion gap: 9 (ref 5–15)
BUN: <5 mg/dL — ABNORMAL LOW (ref 6–20)
CO2: 27 mmol/L (ref 22–32)
Calcium: 9.4 mg/dL (ref 8.9–10.3)
Chloride: 103 mmol/L (ref 98–111)
Creatinine, Ser: 0.47 mg/dL (ref 0.44–1.00)
GFR calc Af Amer: >60 mL/min (ref >60)
GFR calc non Af Amer: >60 mL/min (ref >60)
Glucose, Bld: 87 mg/dL (ref 70–99)
Potassium: 3.9 mmol/L (ref 3.5–5.1)
Sodium: 139 mmol/L (ref 135–145)
Total Bilirubin: 0.6 mg/dL (ref 0.3–1.2)
Total Protein: 6.5 g/dL (ref 6.5–8.1)

## 2019-04-04 MED ORDER — AMLODIPINE BESYLATE 10 MG PO TABS
10.0000 mg | ORAL_TABLET | Freq: Every day | ORAL | Status: DC
  Administered 2019-04-04: 10 mg via ORAL
  Filled 2019-04-04: qty 1

## 2019-04-04 MED ORDER — MORPHINE SULFATE (PF) 2 MG/ML IV SOLN
1.0000 mg | INTRAVENOUS | Status: DC | PRN
  Administered 2019-04-04 (×2): 1 mg via INTRAVENOUS
  Filled 2019-04-04 (×2): qty 1

## 2019-04-04 MED ORDER — FOLIC ACID 1 MG PO TABS: 1.0000 mg | ORAL_TABLET | Freq: Every day | ORAL | 0 refills | Status: DC

## 2019-04-04 MED ORDER — THIAMINE HCL 100 MG PO TABS: 100.0000 mg | ORAL_TABLET | Freq: Every day | ORAL | 0 refills | Status: DC

## 2019-04-04 MED ORDER — AMLODIPINE BESYLATE 10 MG PO TABS: 10.0000 mg | ORAL_TABLET | Freq: Every day | ORAL | 0 refills | Status: DC

## 2019-04-04 NOTE — Plan of Care (Signed)
  Problem: Education: Goal: Knowledge of General Education information will improve Description: Including pain rating scale, medication(s)/side effects and non-pharmacologic comfort measures Outcome: Progressing   Problem: Pain Managment: Goal: General experience of comfort will improve Outcome: Progressing   

## 2019-04-04 NOTE — Discharge Summary (Signed)
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   ALBUMIN 4.6 12/07/2013   BILITOT 0.6 04/04/2019   BILITOT 0.2 08/30/2017   BILITOT 0.5 12/07/2013   ALKPHOS 39 04/04/2019   ALKPHOS 96 12/07/2013   AST 62 (H) 04/04/2019   AST 365 (H) 12/07/2013   ALT 44 04/04/2019   ALT 284 (H) 12/07/2013  .   Total Time in preparing paper work, data evaluation and todays exam - 76 minutes  Phillips Climes M.D on 04/04/2019 at 1:57 PM  [facility]   Office  [phone]

## 2019-04-04 NOTE — Progress Notes (Signed)
DISCHARGE NOTE HOME Bill Leichliter to be discharged Home per MD order. Discussed prescriptions and follow up appointments with the patient. Prescriptions given to patient; medication list explained in detail. Patient verbalized understanding.  Skin clean, dry and intact without evidence of skin break down, no evidence of skin tears noted. IV catheter discontinued intact. Site without signs and symptoms of complications. Dressing and pressure applied. Pt denies pain at the site currently. No complaints noted.  Patient free of lines, drains, and wounds.   An After Visit Summary (AVS) was printed and given to the patient. Patient escorted via wheelchair, and discharged home via private auto.  Aneta Mins BSN, RN3

## 2019-04-11 ENCOUNTER — Telehealth: Payer: Self-pay

## 2019-04-11 NOTE — Telephone Encounter (Signed)
Faxed over pt. medassist application and other docs.  -Charelle Petrakis 

## 2019-04-25 ENCOUNTER — Ambulatory Visit: Payer: Medicaid Other | Admitting: Physician Assistant

## 2019-05-16 ENCOUNTER — Emergency Department (HOSPITAL_COMMUNITY)
Admission: EM | Admit: 2019-05-16 | Discharge: 2019-05-16 | Disposition: A | Discharge status: Home / Self Care | Payer: MEDICAID

## 2019-05-17 ENCOUNTER — Encounter (HOSPITAL_COMMUNITY): Payer: Self-pay | Admitting: Emergency Medicine

## 2019-05-17 ENCOUNTER — Emergency Department (HOSPITAL_COMMUNITY)
Admission: EM | Admit: 2019-05-17 | Discharge: 2019-05-17 | Disposition: A | Discharge status: Home / Self Care | Payer: Medicaid Other | Attending: Emergency Medicine | Admitting: Emergency Medicine

## 2019-05-17 ENCOUNTER — Other Ambulatory Visit: Payer: Self-pay

## 2019-05-17 DIAGNOSIS — Z5321 Procedure and treatment not carried out due to patient leaving prior to being seen by health care provider: Secondary | ICD-10-CM | POA: Insufficient documentation

## 2019-05-17 LAB — CBC
HCT: 39.5 % (ref 36.0–46.0)
Hemoglobin: 13.2 g/dL (ref 12.0–15.0)
MCH: 32.4 pg (ref 26.0–34.0)
MCHC: 33.4 g/dL (ref 30.0–36.0)
MCV: 97.1 fL (ref 80.0–100.0)
Platelets: 269 10*3/uL (ref 150–400)
RBC: 4.07 MIL/uL (ref 3.87–5.11)
RDW: 16 % — ABNORMAL HIGH (ref 11.5–15.5)
WBC: 15.5 10*3/uL — ABNORMAL HIGH (ref 4.0–10.5)
nRBC: 0 % (ref 0.0–0.2)

## 2019-05-17 LAB — COMPREHENSIVE METABOLIC PANEL
ALT: 33 U/L (ref 0–44)
AST: 40 U/L (ref 15–41)
Albumin: 4.7 g/dL (ref 3.5–5.0)
Alkaline Phosphatase: 48 U/L (ref 38–126)
Anion gap: 13 (ref 5–15)
BUN: 11 mg/dL (ref 6–20)
CO2: 24 mmol/L (ref 22–32)
Calcium: 9.2 mg/dL (ref 8.9–10.3)
Chloride: 104 mmol/L (ref 98–111)
Creatinine, Ser: 0.48 mg/dL (ref 0.44–1.00)
GFR calc Af Amer: >60 mL/min (ref >60)
GFR calc non Af Amer: >60 mL/min (ref >60)
Glucose, Bld: 89 mg/dL (ref 70–99)
Potassium: 4.1 mmol/L (ref 3.5–5.1)
Sodium: 141 mmol/L (ref 135–145)
Total Bilirubin: 0.4 mg/dL (ref 0.3–1.2)
Total Protein: 8 g/dL (ref 6.5–8.1)

## 2019-05-17 LAB — LIPASE, BLOOD: Lipase: 72 U/L — ABNORMAL HIGH (ref 11–51)

## 2019-05-17 NOTE — ED Triage Notes (Signed)
Pt states that she is having abd pain and she is having swelling in her face that has been going on for a couple.

## 2019-05-17 NOTE — ED Notes (Signed)
Called for pt, pt not in waiting room

## 2019-05-19 ENCOUNTER — Emergency Department (HOSPITAL_COMMUNITY): Payer: Self-pay

## 2019-05-19 ENCOUNTER — Encounter (HOSPITAL_COMMUNITY): Payer: Self-pay | Admitting: Emergency Medicine

## 2019-05-19 ENCOUNTER — Other Ambulatory Visit: Payer: Self-pay

## 2019-05-19 ENCOUNTER — Emergency Department (HOSPITAL_COMMUNITY)
Admission: EM | Admit: 2019-05-19 | Discharge: 2019-05-19 | Disposition: A | Discharge status: Home / Self Care | Payer: Self-pay | Attending: Emergency Medicine | Admitting: Emergency Medicine

## 2019-05-19 DIAGNOSIS — F17210 Nicotine dependence, cigarettes, uncomplicated: Secondary | ICD-10-CM | POA: Insufficient documentation

## 2019-05-19 DIAGNOSIS — R569 Unspecified convulsions: Secondary | ICD-10-CM | POA: Insufficient documentation

## 2019-05-19 DIAGNOSIS — K0889 Other specified disorders of teeth and supporting structures: Secondary | ICD-10-CM | POA: Insufficient documentation

## 2019-05-19 DIAGNOSIS — Z79899 Other long term (current) drug therapy: Secondary | ICD-10-CM | POA: Insufficient documentation

## 2019-05-19 DIAGNOSIS — R1013 Epigastric pain: Secondary | ICD-10-CM | POA: Insufficient documentation

## 2019-05-19 DIAGNOSIS — R197 Diarrhea, unspecified: Secondary | ICD-10-CM | POA: Insufficient documentation

## 2019-05-19 DIAGNOSIS — R112 Nausea with vomiting, unspecified: Secondary | ICD-10-CM | POA: Insufficient documentation

## 2019-05-19 HISTORY — DX: Acute pancreatitis without necrosis or infection, unspecified: K85.90

## 2019-05-19 HISTORY — DX: Alcohol abuse, uncomplicated: F10.10

## 2019-05-19 LAB — CBC WITH DIFFERENTIAL/PLATELET
Abs Immature Granulocytes: 0.08 10*3/uL — ABNORMAL HIGH (ref 0.00–0.07)
Basophils Absolute: 0 10*3/uL (ref 0.0–0.1)
Basophils Relative: 0 %
Eosinophils Absolute: 0.1 10*3/uL (ref 0.0–0.5)
Eosinophils Relative: 1 %
HCT: 41.1 % (ref 36.0–46.0)
Hemoglobin: 14.2 g/dL (ref 12.0–15.0)
Immature Granulocytes: 0 %
Lymphocytes Relative: 18 %
Lymphs Abs: 3.3 10*3/uL (ref 0.7–4.0)
MCH: 32.6 pg (ref 26.0–34.0)
MCHC: 34.5 g/dL (ref 30.0–36.0)
MCV: 94.5 fL (ref 80.0–100.0)
Monocytes Absolute: 1 10*3/uL (ref 0.1–1.0)
Monocytes Relative: 5 %
Neutro Abs: 14.3 10*3/uL — ABNORMAL HIGH (ref 1.7–7.7)
Neutrophils Relative %: 76 %
Platelets: 225 10*3/uL (ref 150–400)
RBC: 4.35 MIL/uL (ref 3.87–5.11)
RDW: 15.1 % (ref 11.5–15.5)
WBC: 18.9 10*3/uL — ABNORMAL HIGH (ref 4.0–10.5)
nRBC: 0 % (ref 0.0–0.2)

## 2019-05-19 LAB — COMPREHENSIVE METABOLIC PANEL
ALT: 26 U/L (ref 0–44)
AST: 36 U/L (ref 15–41)
Albumin: 4.3 g/dL (ref 3.5–5.0)
Alkaline Phosphatase: 45 U/L (ref 38–126)
Anion gap: 16 — ABNORMAL HIGH (ref 5–15)
BUN: 8 mg/dL (ref 6–20)
CO2: 23 mmol/L (ref 22–32)
Calcium: 8.3 mg/dL — ABNORMAL LOW (ref 8.9–10.3)
Chloride: 97 mmol/L — ABNORMAL LOW (ref 98–111)
Creatinine, Ser: 0.55 mg/dL (ref 0.44–1.00)
GFR calc Af Amer: >60 mL/min (ref >60)
GFR calc non Af Amer: >60 mL/min (ref >60)
Glucose, Bld: 90 mg/dL (ref 70–99)
Potassium: 3.4 mmol/L — ABNORMAL LOW (ref 3.5–5.1)
Sodium: 136 mmol/L (ref 135–145)
Total Bilirubin: 0.3 mg/dL (ref 0.3–1.2)
Total Protein: 7.7 g/dL (ref 6.5–8.1)

## 2019-05-19 LAB — URINALYSIS, ROUTINE W REFLEX MICROSCOPIC
Bilirubin Urine: NEGATIVE
Glucose, UA: NEGATIVE mg/dL
Ketones, ur: NEGATIVE mg/dL
Nitrite: POSITIVE — AB
Protein, ur: 30 mg/dL — AB
Specific Gravity, Urine: 1.006 (ref 1.005–1.030)
pH: 6 (ref 5.0–8.0)

## 2019-05-19 LAB — RAPID URINE DRUG SCREEN, HOSP PERFORMED
Amphetamines: NOT DETECTED
Barbiturates: NOT DETECTED
Benzodiazepines: NOT DETECTED
Cocaine: NOT DETECTED
Opiates: NOT DETECTED
Tetrahydrocannabinol: NOT DETECTED

## 2019-05-19 LAB — POC OCCULT BLOOD, ED: Fecal Occult Bld: NEGATIVE

## 2019-05-19 LAB — PREGNANCY, URINE: Preg Test, Ur: NEGATIVE

## 2019-05-19 LAB — LIPASE, BLOOD: Lipase: 42 U/L (ref 11–51)

## 2019-05-19 LAB — ETHANOL: Alcohol, Ethyl (B): 380 mg/dL (ref <10)

## 2019-05-19 MED ORDER — SODIUM CHLORIDE 0.9 % IV BOLUS
1000.0000 mL | Freq: Once | INTRAVENOUS | Status: AC
  Administered 2019-05-19: 15:00:00 1000 mL via INTRAVENOUS

## 2019-05-19 MED ORDER — HYDROCODONE-ACETAMINOPHEN 5-325 MG PO TABS: ORAL_TABLET | ORAL | 0 refills | Status: DC

## 2019-05-19 MED ORDER — ONDANSETRON HCL 4 MG PO TABS: 4.0000 mg | ORAL_TABLET | Freq: Four times a day (QID) | ORAL | 0 refills | Status: DC

## 2019-05-19 MED ORDER — THIAMINE HCL 100 MG/ML IJ SOLN
100.0000 mg | Freq: Once | INTRAMUSCULAR | Status: AC
  Administered 2019-05-19: 14:00:00 100 mg via INTRAVENOUS
  Filled 2019-05-19: qty 2

## 2019-05-19 MED ORDER — HYDROMORPHONE HCL 1 MG/ML IJ SOLN
1.0000 mg | Freq: Once | INTRAMUSCULAR | Status: AC
  Administered 2019-05-19: 1 mg via INTRAVENOUS
  Filled 2019-05-19: qty 1

## 2019-05-19 MED ORDER — SODIUM CHLORIDE 0.9 % IV BOLUS
1000.0000 mL | Freq: Once | INTRAVENOUS | Status: AC
  Administered 2019-05-19: 1000 mL via INTRAVENOUS

## 2019-05-19 MED ORDER — CEPHALEXIN 500 MG PO CAPS: 500.0000 mg | ORAL_CAPSULE | Freq: Four times a day (QID) | ORAL | 0 refills | Status: DC

## 2019-05-19 MED ORDER — ONDANSETRON HCL 4 MG/2ML IJ SOLN
4.0000 mg | Freq: Once | INTRAMUSCULAR | Status: AC
  Administered 2019-05-19: 13:00:00 4 mg via INTRAVENOUS
  Filled 2019-05-19: qty 2

## 2019-05-19 MED ORDER — HYDROMORPHONE HCL 1 MG/ML IJ SOLN
0.5000 mg | Freq: Once | INTRAMUSCULAR | Status: AC
  Administered 2019-05-19: 14:00:00 0.5 mg via INTRAVENOUS
  Filled 2019-05-19: qty 1

## 2019-05-19 NOTE — Discharge Instructions (Addendum)
Try to avoid alcohol.  Take the antibiotic as directed.  Call a dentist to arrange a follow-up appt

## 2019-05-19 NOTE — ED Notes (Signed)
Date and time results received: 05/19/19 2:04 PM (use smartphrase ".now" to insert current time)  Test: etoh Critical Value: 380  Name of Provider Notified: tammy pa  Orders Received? Or Actions Taken?: Orders Received - See Orders for details

## 2019-05-19 NOTE — ED Triage Notes (Signed)
Pt c/o abdominal pain with n/v. Pt has had acute pancreatitis 2x in the past month. Pt admits to continuing to drink ETOH. Pt also reports bright red blood in her stool and an abscess in her mouth. Pt dry heaving at this time.

## 2019-05-19 NOTE — ED Notes (Signed)
Patient is resting comfortably. 

## 2019-05-19 NOTE — ED Provider Notes (Signed)
[facility] EMERGENCY DEPARTMENT Provider Note   CSN: [id number] Arrival date & time: 05/19/19  1225     History   Chief Complaint Chief Complaint  Patient presents with  . Abdominal Pain    HPI Alaira Nepper is a 43 y.o. female.     HPI   Gena Ayotte is a 43 y.o. female with past medical history of alcohol abuse, substance abuse, hepatitis C and acute pancreatitis presents to the Emergency Department complaining of 2-day history of upper abdominal pain, nausea, vomiting, and diarrhea.  She states that she has been unable to tolerate any food or liquids without vomiting and now having dry heaves.  She states the pain is sharp and radiating into her mid back.  States the pain feels similar to previous flares of her pancreatitis.  She continues to drink alcohol daily and last consumed alcohol at 4 AM this morning.  Also admits to marijuana, but denies recent use.  She also reports multiple episodes of loose stools and states that she noticed intermittent bright red blood from her rectum post defecation.  She also reports pain and swelling to her upper gums and lip.  States that she has "bad teeth" and an "abscess" inside her lip that began draining this morning.  She denies fever, chills, dysuria, chest pain or shortness of breath.     Past Medical History:  Diagnosis Date  . Alcohol abuse   . Back injury   . Gastric ulcer   . Gastric ulcer 10/14/2013  . Hepatitis C 10/14/2013  . History of kidney stones   . 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 Pancreatitis, acute   . Panic attack   . Pneumonia   . Polysubstance abuse ([place])   . PONV (postoperative nausea and vomiting)   . Seizures [facility])     Patient Active Problem List   Diagnosis Date Noted  . Hypokalemia 03/31/2019  . Cannabis abuse 03/11/2019  . Elevated transaminase level   . Alcohol abuse   . Acute pancreatitis 03/10/2019  . Polysubstance (excluding opioids) dependence, daily use ([place]) 09/13/2018  . Closed  displaced fracture of fifth metatarsal bone of right foot 08/02/2018  . Dental abscess 12/25/2017  . Gastric ulcer 10/14/2013  . Transaminitis 10/14/2013  . Tobacco dependence 10/14/2013  . Hepatitis C 10/14/2013  . Thrombocytopenia ([place]) 10/13/2013  . Duodenitis 10/12/2013  . Alcoholism ([place]) 10/12/2013  . Abdominal pain, epigastric 10/12/2013  . ANKLE PAIN 01/01/2008    Past Surgical History:  Procedure Laterality Date  . BACK SURGERY    . BREAST SURGERY    . ESOPHAGOGASTRODUODENOSCOPY (EGD) WITH PROPOFOL N/A 10/14/2013   Procedure: ESOPHAGOGASTRODUODENOSCOPY (EGD) WITH PROPOFOL;  Surgeon: Rogene Houston, MD;  Location: [facility];  Service: Endoscopy;  Laterality: N/A;  . ORIF TOE FRACTURE Right 08/08/2018   Procedure: RIGHT FOOT INTERNAL FIXATION 5TH METATARSAL;  Surgeon: Newt Minion, MD;  Location: [facility];  Service: Orthopedics;  Laterality: Right;  . TONSILLECTOMY       OB History    Gravida  7   Para  3   Term  3   Preterm      AB  4   Living  3     SAB  2   TAB  2   Ectopic      Multiple      Live Births  3            Home Medications  Prior to Admission medications   Medication Sig Start Date End Date Taking? Authorizing Provider  amLODipine (NORVASC) 10 MG tablet Take 1 tablet (10 mg total) by mouth daily. 04/04/19   Elgergawy, Silver Huguenin, MD  folic acid (FOLVITE) 1 MG tablet Take 1 tablet (1 mg total) by mouth daily. 04/05/19   Elgergawy, Silver Huguenin, MD  oxyCODONE-acetaminophen (PERCOCET/ROXICET) 5-325 MG tablet Take 1 tablet by mouth every 8 (eight) hours as needed. Patient not taking: Reported on 03/10/2019 09/06/18   Rayburn, Neta Mends, PA-C  pantoprazole (PROTONIX) 40 MG tablet Take 40 mg by mouth 2 (two) times daily.     [provider]  thiamine 100 MG tablet Take 1 tablet (100 mg total) by mouth daily. 04/05/19   Elgergawy, Silver Huguenin, MD    Family History Family History  Problem Relation Age of Onset  . Cancer Mother 67        ovarian   . Hypertension Mother   . Stroke Mother   . Early death Father   . Alcohol abuse Father     Social History Social History   Tobacco Use  . Smoking status: Current Every Day Smoker    Packs/day: 1.00    Years: 20.00    Pack years: 20.00    Types: Cigarettes  . Smokeless tobacco: Never Used  Substance Use Topics  . Alcohol use: Yes    Alcohol/week: 12.0 standard drinks    Types: 12 Cans of beer per week    Frequency: Never    Comment: several times weekly  . Drug use: Yes    Types: Marijuana, Cocaine, Methamphetamines     Allergies   Motrin [ibuprofen]   Review of Systems Review of Systems  Constitutional: Negative for appetite change, chills and fever.  HENT: Positive for dental problem. Negative for facial swelling, sore throat and trouble swallowing.   Respiratory: Negative for shortness of breath.   Cardiovascular: Negative for chest pain.  Gastrointestinal: Positive for abdominal pain, blood in stool, diarrhea, nausea and vomiting.  Genitourinary: Negative for decreased urine volume, difficulty urinating, dysuria and flank pain.  Musculoskeletal: Negative for back pain.  Skin: Negative for color change and rash.  Neurological: Negative for dizziness, weakness and numbness.  Hematological: Negative for adenopathy.     Physical Exam Updated Vital Signs BP (!) 145/109 (BP Location: Right Arm)   Pulse (!) 123   Temp 98 F (36.7 C) (Oral)   Resp 20   Ht 5' 6.5" (1.689 m)   Wt 49 kg   SpO2 99%   BMI 17.17 kg/m   Physical Exam Vitals signs and nursing note reviewed.  Constitutional:      General: She is not in acute distress.    Appearance: She is well-developed.     Comments: Pt appears older than stated age.   HENT:     Head: Normocephalic and atraumatic.     Mouth/Throat:     Mouth: Mucous membranes are dry.     Comments: Multiple dental caries with upper incisors decayed to the gumline.  There is a single, small draining pustule of the  gingiva of the right upper central incisor.  Mild edema noted of the mid upper lip.  No other facial edema noted.  Uvula is midline, no trismus.   Neck:     Musculoskeletal: Normal range of motion. No muscular tenderness.  Cardiovascular:     Rate and Rhythm: Normal rate and regular rhythm.     Heart sounds: Normal heart sounds.  No murmur.  Pulmonary:     Effort: Pulmonary effort is normal. No respiratory distress.     Breath sounds: Normal breath sounds.  Abdominal:     General: Bowel sounds are normal. There is no distension.     Palpations: Abdomen is soft. There is no mass.     Tenderness: There is abdominal tenderness in the epigastric area. There is no right CVA tenderness, left CVA tenderness, guarding or rebound.     Comments: ttp of epigastric region.  No guarding or rebound tenderness.    Genitourinary:    Rectum: Guaiac result negative. No mass, tenderness, anal fissure or external hemorrhoid. Normal anal tone.     Comments: No palpable rectal masses or tenderness on DRE.  Brown heme negative stool Musculoskeletal: Normal range of motion.  Lymphadenopathy:     Cervical: No cervical adenopathy.  Skin:    General: Skin is warm.     Findings: No rash.  Neurological:     Mental Status: She is alert and oriented to person, place, and time.     Motor: No abnormal muscle tone.     Coordination: Coordination normal.      ED Treatments / Results  Labs (all labs ordered are listed, but only abnormal results are displayed) Labs Reviewed  COMPREHENSIVE METABOLIC PANEL - Abnormal; Notable for the following components:      Result Value   Potassium 3.4 (*)    Chloride 97 (*)    Calcium 8.3 (*)    Anion gap 16 (*)    All other components within normal limits  ETHANOL - Abnormal; Notable for the following components:   Alcohol, Ethyl (B) 380 (*)    All other components within normal limits  CBC WITH DIFFERENTIAL/PLATELET - Abnormal; Notable for the following components:   WBC  18.9 (*)    Neutro Abs 14.3 (*)    Abs Immature Granulocytes 0.08 (*)    All other components within normal limits  URINALYSIS, ROUTINE W REFLEX MICROSCOPIC - Abnormal; Notable for the following components:   APPearance HAZY (*)    Hgb urine dipstick SMALL (*)    Protein, ur 30 (*)    Nitrite POSITIVE (*)    Leukocytes,Ua SMALL (*)    Bacteria, UA FEW (*)    All other components within normal limits  URINE CULTURE  LIPASE, BLOOD  PREGNANCY, URINE  RAPID URINE DRUG SCREEN, HOSP PERFORMED    EKG None  Radiology Dg Chest 1 View  Result Date: 05/19/2019 CLINICAL DATA:  Upper abdominal pain EXAM: CHEST  1 VIEW COMPARISON:  03/31/2019 FINDINGS: The heart size and mediastinal contours are within normal limits. Both lungs are clear. The visualized skeletal structures are unremarkable. IMPRESSION: No active disease. Electronically Signed   By: Kathreen Devoid   On: 05/19/2019 15:38    Procedures Procedures (including critical care time)  Medications Ordered in ED Medications  sodium chloride 0.9 % bolus 1,000 mL (0 mLs Intravenous Stopped 05/19/19 1446)  ondansetron (ZOFRAN) injection 4 mg (4 mg Intravenous Given 05/19/19 1323)  HYDROmorphone (DILAUDID) injection 1 mg (1 mg Intravenous Given 05/19/19 1323)  thiamine (B-1) injection 100 mg (100 mg Intravenous Given 05/19/19 1419)  HYDROmorphone (DILAUDID) injection 0.5 mg (0.5 mg Intravenous Given 05/19/19 1416)  sodium chloride 0.9 % bolus 1,000 mL (1,000 mLs Intravenous New Bag/Given 05/19/19 1455)     Initial Impression / Assessment and Plan / ED Course  I have reviewed the triage vital signs and the nursing notes.  Pertinent labs & imaging results that were available during my care of the patient were reviewed by me and considered in my medical decision making (see chart for details).      on review of medical records, pt had CT abd/pelvis 03/31/19 that revealed inflammatory changes of the pancreatic head and uncinate, no ductal  dilatation.    CIWA score on this visit is 9.    1455  On recheck, pt reports feeling better, n/v improved. Initial tachycardia also improved after IVF's  1530  Pt sitting upright on the stretcher, feeling better.  No further vomiting or dry heaves.  Will try oral fluid challenge.    1610  Tolerated fluid challenge.  Pt states that she is ready for d/c home.  She does have a likely dental abscess that is spontaneously draining.  No indication for I&D.  No concerning sx's for ludwig's angina.  I will prescribe abx for her dental infection along with anti-emetic and short course of pain medication. She agrees to arrange close dental and GI f/u with Dr. Laural Golden   Final Clinical Impressions(s) / ED Diagnoses   Final diagnoses:  Epigastric pain  Nausea vomiting and diarrhea  Pain, dental    ED Discharge Orders    None       Bufford Lope 05/20/19 [id number]    Noemi Chapel, MD 05/22/19 1005

## 2019-05-22 LAB — URINE CULTURE: Culture: >=100000 — AB

## 2019-05-23 ENCOUNTER — Telehealth (HOSPITAL_BASED_OUTPATIENT_CLINIC_OR_DEPARTMENT_OTHER): Payer: Self-pay

## 2019-05-23 NOTE — Telephone Encounter (Signed)
Post ED Visit - Positive Culture Follow-up  Culture report reviewed by antimicrobial stewardship pharmacist: [place] Team []  Elenor Quinones, Pharm.D. []  Heide Guile, Pharm.D., BCPS AQ-ID []  Parks Neptune, Pharm.D., BCPS []  Alycia Rossetti, Pharm.D., BCPS []  [place], [place].D., BCPS, AAHIVP []  Legrand Como, Pharm.D., BCPS, AAHIVP []  Salome Arnt, PharmD, BCPS []  Johnnette Gourd, PharmD, BCPS []  Hughes Better, PharmD, BCPS []  Leeroy Cha, PharmD []  Laqueta Linden, PharmD, BCPS []  Albertina Parr, PharmD Velia Meyer B. Francesca Jewett, PharmD  [place] Team []  Leodis Sias, PharmD []  Lindell Spar, PharmD []  Royetta Asal, PharmD []  Graylin Shiver, Rph []  Rema Fendt) Glennon Mac, PharmD []  Arlyn Dunning, PharmD []  Netta Cedars, PharmD []  Dia Sitter, PharmD []  Leone Haven, PharmD []  Gretta Arab, PharmD []  Theodis Shove, PharmD []  Peggyann Juba, PharmD []  Reuel Boom, PharmD   Positive urine culture, >/= 100,000 colonies -> E Coli Treated with Cephalexin, organism sensitive to the same and no further patient follow-up is required at this time.  Dortha Kern 05/23/2019, 10:25 AM

## 2019-05-31 ENCOUNTER — Other Ambulatory Visit: Payer: Self-pay

## 2019-05-31 ENCOUNTER — Encounter (HOSPITAL_COMMUNITY): Payer: Self-pay | Admitting: Emergency Medicine

## 2019-05-31 ENCOUNTER — Emergency Department (HOSPITAL_COMMUNITY)
Admission: EM | Admit: 2019-05-31 | Discharge: 2019-05-31 | Disposition: A | Discharge status: Home / Self Care | Payer: Medicaid Other | Attending: Emergency Medicine | Admitting: Emergency Medicine

## 2019-05-31 DIAGNOSIS — Z5321 Procedure and treatment not carried out due to patient leaving prior to being seen by health care provider: Secondary | ICD-10-CM | POA: Insufficient documentation

## 2019-05-31 DIAGNOSIS — M79671 Pain in right foot: Secondary | ICD-10-CM | POA: Insufficient documentation

## 2019-05-31 DIAGNOSIS — R05 Cough: Secondary | ICD-10-CM | POA: Insufficient documentation

## 2019-05-31 NOTE — ED Triage Notes (Signed)
Fever, cough x 2 days, RT foot pain.

## 2019-05-31 NOTE — ED Notes (Signed)
Called x 2 with no answer. 

## 2019-06-24 ENCOUNTER — Inpatient Hospital Stay (HOSPITAL_COMMUNITY)
Admission: EM | Admit: 2019-06-24 | Discharge: 2019-06-25 | DRG: 894 | Discharge status: Against Medical Advice | Payer: Self-pay | Attending: Family Medicine | Admitting: Family Medicine

## 2019-06-24 ENCOUNTER — Emergency Department (HOSPITAL_COMMUNITY): Payer: Self-pay

## 2019-06-24 ENCOUNTER — Encounter (HOSPITAL_COMMUNITY): Payer: Self-pay | Admitting: Emergency Medicine

## 2019-06-24 ENCOUNTER — Other Ambulatory Visit: Payer: Self-pay

## 2019-06-24 DIAGNOSIS — E876 Hypokalemia: Secondary | ICD-10-CM | POA: Diagnosis present

## 2019-06-24 DIAGNOSIS — Z886 Allergy status to analgesic agent status: Secondary | ICD-10-CM

## 2019-06-24 DIAGNOSIS — Z809 Family history of malignant neoplasm, unspecified: Secondary | ICD-10-CM

## 2019-06-24 DIAGNOSIS — R7401 Elevation of levels of liver transaminase levels: Secondary | ICD-10-CM | POA: Diagnosis present

## 2019-06-24 DIAGNOSIS — R000 Tachycardia, unspecified: Secondary | ICD-10-CM | POA: Diagnosis present

## 2019-06-24 DIAGNOSIS — R21 Rash and other nonspecific skin eruption: Secondary | ICD-10-CM | POA: Diagnosis present

## 2019-06-24 DIAGNOSIS — Z823 Family history of stroke: Secondary | ICD-10-CM

## 2019-06-24 DIAGNOSIS — I10 Essential (primary) hypertension: Secondary | ICD-10-CM | POA: Diagnosis present

## 2019-06-24 DIAGNOSIS — F10939 Alcohol use, unspecified with withdrawal, unspecified: Secondary | ICD-10-CM | POA: Diagnosis present

## 2019-06-24 DIAGNOSIS — F17210 Nicotine dependence, cigarettes, uncomplicated: Secondary | ICD-10-CM | POA: Diagnosis present

## 2019-06-24 DIAGNOSIS — Z20828 Contact with and (suspected) exposure to other viral communicable diseases: Secondary | ICD-10-CM | POA: Diagnosis present

## 2019-06-24 DIAGNOSIS — Y907 Blood alcohol level of 200-239 mg/100 ml: Secondary | ICD-10-CM | POA: Diagnosis present

## 2019-06-24 DIAGNOSIS — B1920 Unspecified viral hepatitis C without hepatic coma: Secondary | ICD-10-CM | POA: Diagnosis present

## 2019-06-24 DIAGNOSIS — Z5329 Procedure and treatment not carried out because of patient's decision for other reasons: Secondary | ICD-10-CM | POA: Diagnosis not present

## 2019-06-24 DIAGNOSIS — F1920 Other psychoactive substance dependence, uncomplicated: Secondary | ICD-10-CM | POA: Diagnosis present

## 2019-06-24 DIAGNOSIS — Z8719 Personal history of other diseases of the digestive system: Secondary | ICD-10-CM

## 2019-06-24 DIAGNOSIS — D696 Thrombocytopenia, unspecified: Secondary | ICD-10-CM | POA: Diagnosis present

## 2019-06-24 DIAGNOSIS — F1020 Alcohol dependence, uncomplicated: Secondary | ICD-10-CM | POA: Diagnosis present

## 2019-06-24 DIAGNOSIS — F1210 Cannabis abuse, uncomplicated: Secondary | ICD-10-CM | POA: Diagnosis present

## 2019-06-24 DIAGNOSIS — E860 Dehydration: Secondary | ICD-10-CM | POA: Diagnosis present

## 2019-06-24 DIAGNOSIS — F10239 Alcohol dependence with withdrawal, unspecified: Secondary | ICD-10-CM | POA: Diagnosis present

## 2019-06-24 DIAGNOSIS — Z79899 Other long term (current) drug therapy: Secondary | ICD-10-CM

## 2019-06-24 DIAGNOSIS — F17200 Nicotine dependence, unspecified, uncomplicated: Secondary | ICD-10-CM | POA: Diagnosis present

## 2019-06-24 DIAGNOSIS — D6959 Other secondary thrombocytopenia: Secondary | ICD-10-CM | POA: Diagnosis present

## 2019-06-24 DIAGNOSIS — Z8249 Family history of ischemic heart disease and other diseases of the circulatory system: Secondary | ICD-10-CM

## 2019-06-24 DIAGNOSIS — Z885 Allergy status to narcotic agent status: Secondary | ICD-10-CM

## 2019-06-24 DIAGNOSIS — F10231 Alcohol dependence with withdrawal delirium: Principal | ICD-10-CM | POA: Diagnosis present

## 2019-06-24 LAB — COMPREHENSIVE METABOLIC PANEL
ALT: 106 U/L — ABNORMAL HIGH (ref 0–44)
AST: 192 U/L — ABNORMAL HIGH (ref 15–41)
Albumin: 5.6 g/dL — ABNORMAL HIGH (ref 3.5–5.0)
Alkaline Phosphatase: 59 U/L (ref 38–126)
Anion gap: 20 — ABNORMAL HIGH (ref 5–15)
BUN: 13 mg/dL (ref 6–20)
CO2: 23 mmol/L (ref 22–32)
Calcium: 8.8 mg/dL — ABNORMAL LOW (ref 8.9–10.3)
Chloride: 99 mmol/L (ref 98–111)
Creatinine, Ser: 0.58 mg/dL (ref 0.44–1.00)
GFR calc Af Amer: >60 mL/min (ref >60)
GFR calc non Af Amer: >60 mL/min (ref >60)
Glucose, Bld: 80 mg/dL (ref 70–99)
Potassium: 3.1 mmol/L — ABNORMAL LOW (ref 3.5–5.1)
Sodium: 142 mmol/L (ref 135–145)
Total Bilirubin: 0.8 mg/dL (ref 0.3–1.2)
Total Protein: 8.9 g/dL — ABNORMAL HIGH (ref 6.5–8.1)

## 2019-06-24 LAB — CBC WITH DIFFERENTIAL/PLATELET
Abs Immature Granulocytes: 0.03 10*3/uL (ref 0.00–0.07)
Basophils Absolute: 0 10*3/uL (ref 0.0–0.1)
Basophils Relative: 1 %
Eosinophils Absolute: 0 10*3/uL (ref 0.0–0.5)
Eosinophils Relative: 0 %
HCT: 41.5 % (ref 36.0–46.0)
Hemoglobin: 13.8 g/dL (ref 12.0–15.0)
Immature Granulocytes: 1 %
Lymphocytes Relative: 19 %
Lymphs Abs: 1.2 10*3/uL (ref 0.7–4.0)
MCH: 33.3 pg (ref 26.0–34.0)
MCHC: 33.3 g/dL (ref 30.0–36.0)
MCV: 100 fL (ref 80.0–100.0)
Monocytes Absolute: 0.3 10*3/uL (ref 0.1–1.0)
Monocytes Relative: 4 %
Neutro Abs: 4.7 10*3/uL (ref 1.7–7.7)
Neutrophils Relative %: 75 %
Platelets: 122 10*3/uL — ABNORMAL LOW (ref 150–400)
RBC: 4.15 MIL/uL (ref 3.87–5.11)
RDW: 16.5 % — ABNORMAL HIGH (ref 11.5–15.5)
WBC: 6.3 10*3/uL (ref 4.0–10.5)
nRBC: 0 % (ref 0.0–0.2)

## 2019-06-24 LAB — RAPID URINE DRUG SCREEN, HOSP PERFORMED
Amphetamines: NOT DETECTED
Barbiturates: NOT DETECTED
Benzodiazepines: NOT DETECTED
Cocaine: NOT DETECTED
Opiates: NOT DETECTED
Tetrahydrocannabinol: POSITIVE — AB

## 2019-06-24 LAB — POC URINE PREG, ED: Preg Test, Ur: NEGATIVE

## 2019-06-24 LAB — PROTIME-INR
INR: 0.9 (ref 0.8–1.2)
Prothrombin Time: 11.6 seconds (ref 11.4–15.2)

## 2019-06-24 LAB — SARS CORONAVIRUS 2 (TAT 6-24 HRS): SARS Coronavirus 2: NEGATIVE

## 2019-06-24 LAB — LIPASE, BLOOD: Lipase: 57 U/L — ABNORMAL HIGH (ref 11–51)

## 2019-06-24 LAB — ETHANOL: Alcohol, Ethyl (B): 210 mg/dL — ABNORMAL HIGH (ref <10)

## 2019-06-24 LAB — PHOSPHORUS: Phosphorus: 2.2 mg/dL — ABNORMAL LOW (ref 2.5–4.6)

## 2019-06-24 LAB — MAGNESIUM: Magnesium: 1.7 mg/dL (ref 1.7–2.4)

## 2019-06-24 MED ORDER — ONDANSETRON HCL 4 MG PO TABS
4.0000 mg | ORAL_TABLET | Freq: Three times a day (TID) | ORAL | Status: DC | PRN
  Administered 2019-06-24: 4 mg via ORAL
  Filled 2019-06-24: qty 1

## 2019-06-24 MED ORDER — ONDANSETRON HCL 4 MG/2ML IJ SOLN
4.0000 mg | Freq: Four times a day (QID) | INTRAMUSCULAR | Status: DC | PRN
  Administered 2019-06-24 – 2019-06-25 (×4): 4 mg via INTRAVENOUS
  Filled 2019-06-24 (×5): qty 2

## 2019-06-24 MED ORDER — SODIUM CHLORIDE 0.9 % IV SOLN
INTRAVENOUS | Status: DC
  Administered 2019-06-24: 08:00:00 via INTRAVENOUS

## 2019-06-24 MED ORDER — FENTANYL CITRATE (PF) 100 MCG/2ML IJ SOLN: 12.5000 ug | INTRAMUSCULAR | Status: DC | PRN

## 2019-06-24 MED ORDER — LABETALOL HCL 5 MG/ML IV SOLN: 10.0000 mg | INTRAVENOUS | Status: DC | PRN

## 2019-06-24 MED ORDER — PANTOPRAZOLE SODIUM 40 MG PO TBEC
40.0000 mg | DELAYED_RELEASE_TABLET | Freq: Two times a day (BID) | ORAL | Status: DC
  Administered 2019-06-24 – 2019-06-25 (×3): 40 mg via ORAL
  Filled 2019-06-24 (×3): qty 1

## 2019-06-24 MED ORDER — MAGNESIUM SULFATE 2 GM/50ML IV SOLN
2.0000 g | Freq: Once | INTRAVENOUS | Status: AC
  Administered 2019-06-24: 2 g via INTRAVENOUS
  Filled 2019-06-24: qty 50

## 2019-06-24 MED ORDER — VITAMIN B-1 100 MG PO TABS: 100.0000 mg | ORAL_TABLET | Freq: Every day | ORAL | Status: DC

## 2019-06-24 MED ORDER — POTASSIUM CHLORIDE IN NACL 20-0.9 MEQ/L-% IV SOLN
INTRAVENOUS | Status: DC
  Administered 2019-06-24 (×2): via INTRAVENOUS

## 2019-06-24 MED ORDER — VITAMIN B-1 100 MG PO TABS
100.0000 mg | ORAL_TABLET | Freq: Every day | ORAL | Status: DC
  Administered 2019-06-24 – 2019-06-25 (×2): 100 mg via ORAL
  Filled 2019-06-24 (×2): qty 1

## 2019-06-24 MED ORDER — KETOROLAC TROMETHAMINE 15 MG/ML IJ SOLN: 7.5000 mg | Freq: Four times a day (QID) | INTRAMUSCULAR | Status: DC | PRN

## 2019-06-24 MED ORDER — LORAZEPAM 2 MG/ML IJ SOLN: 0.0000 mg | Freq: Two times a day (BID) | INTRAMUSCULAR | Status: DC

## 2019-06-24 MED ORDER — CHLORDIAZEPOXIDE HCL 5 MG PO CAPS
10.0000 mg | ORAL_CAPSULE | Freq: Three times a day (TID) | ORAL | Status: DC
  Administered 2019-06-24 – 2019-06-25 (×3): 10 mg via ORAL
  Filled 2019-06-24 (×3): qty 2

## 2019-06-24 MED ORDER — FOLIC ACID 1 MG PO TABS
1.0000 mg | ORAL_TABLET | Freq: Every day | ORAL | Status: DC
  Administered 2019-06-24 – 2019-06-25 (×2): 1 mg via ORAL
  Filled 2019-06-24 (×2): qty 1

## 2019-06-24 MED ORDER — LORAZEPAM 1 MG PO TABS: 1.0000 mg | ORAL_TABLET | ORAL | Status: DC | PRN

## 2019-06-24 MED ORDER — FENTANYL CITRATE (PF) 100 MCG/2ML IJ SOLN
12.5000 ug | INTRAMUSCULAR | Status: DC | PRN
  Administered 2019-06-25 (×6): 12.5 ug via INTRAVENOUS
  Filled 2019-06-24 (×6): qty 2

## 2019-06-24 MED ORDER — THIAMINE HCL 100 MG/ML IJ SOLN
100.0000 mg | Freq: Every day | INTRAMUSCULAR | Status: DC
  Administered 2019-06-24: 09:00:00 100 mg via INTRAVENOUS
  Filled 2019-06-24: qty 2

## 2019-06-24 MED ORDER — PROMETHAZINE HCL 12.5 MG PO TABS
12.5000 mg | ORAL_TABLET | Freq: Four times a day (QID) | ORAL | Status: DC | PRN
  Filled 2019-06-24: qty 1

## 2019-06-24 MED ORDER — THIAMINE HCL 100 MG/ML IJ SOLN: 100.0000 mg | Freq: Every day | INTRAMUSCULAR | Status: DC

## 2019-06-24 MED ORDER — SODIUM CHLORIDE 0.9 % IV BOLUS
500.0000 mL | Freq: Once | INTRAVENOUS | Status: AC
  Administered 2019-06-24: 500 mL via INTRAVENOUS

## 2019-06-24 MED ORDER — LORAZEPAM 2 MG/ML IJ SOLN
0.0000 mg | Freq: Four times a day (QID) | INTRAMUSCULAR | Status: DC
  Administered 2019-06-24: 1 mg via INTRAVENOUS
  Filled 2019-06-24: qty 1

## 2019-06-24 MED ORDER — PNEUMOCOCCAL VAC POLYVALENT 25 MCG/0.5ML IJ INJ: 0.5000 mL | INJECTION | INTRAMUSCULAR | Status: DC

## 2019-06-24 MED ORDER — NICOTINE 21 MG/24HR TD PT24
21.0000 mg | MEDICATED_PATCH | Freq: Every day | TRANSDERMAL | Status: DC
  Administered 2019-06-24 – 2019-06-25 (×2): 21 mg via TRANSDERMAL
  Filled 2019-06-24 (×2): qty 1

## 2019-06-24 MED ORDER — LORAZEPAM 1 MG PO TABS: 0.0000 mg | ORAL_TABLET | Freq: Two times a day (BID) | ORAL | Status: DC

## 2019-06-24 MED ORDER — ADULT MULTIVITAMIN W/MINERALS CH: 1.0000 | ORAL_TABLET | Freq: Every day | ORAL | Status: DC

## 2019-06-24 MED ORDER — FOLIC ACID 1 MG PO TABS: 1.0000 mg | ORAL_TABLET | Freq: Every day | ORAL | Status: DC

## 2019-06-24 MED ORDER — LORAZEPAM 2 MG/ML IJ SOLN
1.0000 mg | Freq: Once | INTRAMUSCULAR | Status: AC
  Administered 2019-06-24: 1 mg via INTRAVENOUS
  Filled 2019-06-24: qty 1

## 2019-06-24 MED ORDER — FENTANYL CITRATE (PF) 100 MCG/2ML IJ SOLN
12.5000 ug | INTRAMUSCULAR | Status: DC | PRN
  Administered 2019-06-24 (×3): 12.5 ug via INTRAVENOUS
  Filled 2019-06-24 (×3): qty 2

## 2019-06-24 MED ORDER — ENOXAPARIN SODIUM 40 MG/0.4ML ~~LOC~~ SOLN
40.0000 mg | SUBCUTANEOUS | Status: DC
  Administered 2019-06-24: 40 mg via SUBCUTANEOUS
  Filled 2019-06-24: qty 0.4

## 2019-06-24 MED ORDER — CHLORDIAZEPOXIDE HCL 25 MG PO CAPS
25.0000 mg | ORAL_CAPSULE | Freq: Three times a day (TID) | ORAL | Status: DC
  Administered 2019-06-24: 17:00:00 25 mg via ORAL
  Filled 2019-06-24 (×2): qty 1

## 2019-06-24 MED ORDER — ENSURE ENLIVE PO LIQD
237.0000 mL | Freq: Two times a day (BID) | ORAL | Status: DC
  Administered 2019-06-24 – 2019-06-25 (×3): 237 mL via ORAL

## 2019-06-24 MED ORDER — LORAZEPAM 1 MG PO TABS: 0.0000 mg | ORAL_TABLET | Freq: Four times a day (QID) | ORAL | Status: DC

## 2019-06-24 MED ORDER — LORAZEPAM 2 MG/ML IJ SOLN
1.0000 mg | INTRAMUSCULAR | Status: DC | PRN
  Administered 2019-06-24: 23:00:00 1 mg via INTRAVENOUS
  Administered 2019-06-24 (×2): 2 mg via INTRAVENOUS
  Administered 2019-06-24 – 2019-06-25 (×3): 1 mg via INTRAVENOUS
  Administered 2019-06-25: 2 mg via INTRAVENOUS
  Administered 2019-06-25: 1 mg via INTRAVENOUS
  Administered 2019-06-25 (×3): 2 mg via INTRAVENOUS
  Administered 2019-06-25 (×2): 1 mg via INTRAVENOUS
  Filled 2019-06-24 (×13): qty 1

## 2019-06-24 MED ORDER — AMLODIPINE BESYLATE 5 MG PO TABS
10.0000 mg | ORAL_TABLET | Freq: Every day | ORAL | Status: DC
  Administered 2019-06-24 – 2019-06-25 (×2): 10 mg via ORAL
  Filled 2019-06-24 (×2): qty 2

## 2019-06-24 MED ORDER — ADULT MULTIVITAMIN W/MINERALS CH
1.0000 | ORAL_TABLET | Freq: Every day | ORAL | Status: DC
  Administered 2019-06-24 – 2019-06-25 (×2): 1 via ORAL
  Filled 2019-06-24 (×2): qty 1

## 2019-06-24 MED ORDER — ONDANSETRON HCL 4 MG PO TABS: 4.0000 mg | ORAL_TABLET | Freq: Four times a day (QID) | ORAL | Status: DC | PRN

## 2019-06-24 NOTE — ED Provider Notes (Signed)
[facility] Provider Note   CSN: [id number] Arrival date & time: 06/24/19  0705     History   Chief Complaint Chief Complaint  Patient presents with   Delirium Tremens (DTS)    HPI Olivia Lester is a 43 y.o. female.     Patient presenting with concerns for alcohol withdrawal.  States that she last had alcohol 4:00 yesterday afternoon.  Patient states she normally starts developing a tremor within 4 hours.  Which she did.  Patient is also had multiple episodes of vomiting.  Did not see any blood in the vomit.  Patient denies any hallucinations or any tactile sensations.  Patient does states she has a mild headache.  Patient's had a history of acute pancreatitis in the past.  He has no abdominal pain.  Patient states that she drinks approximately 16-18 beers a day or 1/5 of alcohol.  Patient is also had a cough for several weeks.  But no recent fevers.     Past Medical History:  Diagnosis Date   Alcohol abuse    Back injury    Gastric ulcer    Gastric ulcer 10/14/2013   Hepatitis C 10/14/2013   History of kidney stones    Nonunion of foot fracture    right 5th metatarsal   Pancreatitis, acute    Panic attack    Pneumonia    Polysubstance abuse (HCC)    PONV (postoperative nausea and vomiting)    Seizures ([place])     Patient Active Problem List   Diagnosis Date Noted   Alcohol withdrawal ([place]) 06/24/2019   Sinus tachycardia 06/24/2019   Essential hypertension 06/24/2019   Hypokalemia 03/31/2019   Cannabis abuse 03/11/2019   Elevated transaminase level    Alcohol abuse    Polysubstance (excluding opioids) dependence, daily use ([place]) 09/13/2018   Closed displaced fracture of fifth metatarsal bone of right foot 08/02/2018   Dental abscess 12/25/2017   Gastric ulcer 10/14/2013   Transaminitis 10/14/2013   Tobacco dependence 10/14/2013   Hepatitis C 10/14/2013   Thrombocytopenia ([place]) 10/13/2013   Duodenitis 10/12/2013     Alcoholism ([place]) 10/12/2013   Abdominal pain, epigastric 10/12/2013   ANKLE PAIN 01/01/2008    Past Surgical History:  Procedure Laterality Date   BACK SURGERY     BREAST SURGERY     ESOPHAGOGASTRODUODENOSCOPY (EGD) WITH PROPOFOL N/A 10/14/2013   Procedure: ESOPHAGOGASTRODUODENOSCOPY (EGD) WITH PROPOFOL;  Surgeon: Rogene Houston, MD;  Location: [facility];  Service: Endoscopy;  Laterality: N/A;   ORIF TOE FRACTURE Right 08/08/2018   Procedure: RIGHT FOOT INTERNAL FIXATION 5TH METATARSAL;  Surgeon: Newt Minion, MD;  Location: [facility];  Service: Orthopedics;  Laterality: Right;   TONSILLECTOMY       OB History    Gravida  7   Para  3   Term  3   Preterm      AB  4   Living  3     SAB  2   TAB  2   Ectopic      Multiple      Live Births  3            Home Medications    Prior to Admission medications   Medication Sig Start Date End Date Taking? Authorizing Provider  amLODipine (NORVASC) 10 MG tablet Take 1 tablet (10 mg total) by mouth daily. 04/04/19   Elgergawy, Silver Huguenin, MD  folic acid (FOLVITE) 1 MG tablet Take 1 tablet (1  mg total) by mouth daily. 04/05/19   Elgergawy, Silver Huguenin, MD  ondansetron (ZOFRAN) 4 MG tablet Take 1 tablet (4 mg total) by mouth every 6 (six) hours. 05/19/19   Triplett, Tammy, PA-C  pantoprazole (PROTONIX) 40 MG tablet Take 40 mg by mouth 2 (two) times daily.     [provider]  thiamine 100 MG tablet Take 1 tablet (100 mg total) by mouth daily. 04/05/19   Elgergawy, Silver Huguenin, MD    Family History Family History  Problem Relation Age of Onset   Cancer Mother 49       ovarian    Hypertension Mother    Stroke Mother    Early death Father    Alcohol abuse Father     Social History Social History   Tobacco Use   Smoking status: Current Every Day Smoker    Packs/day: 1.00    Years: 20.00    Pack years: 20.00    Types: Cigarettes   Smokeless tobacco: Never Used  Substance Use Topics   Alcohol use:  Yes    Alcohol/week: 12.0 standard drinks    Types: 12 Cans of beer per week    Frequency: Never    Comment: daily   Drug use: Yes    Types: Marijuana, Cocaine, Methamphetamines    Comment: denies current cocaine/meth...current mj     Allergies   Asa [aspirin]   Review of Systems Review of Systems  Constitutional: Negative for chills and fever.  HENT: Negative for congestion, rhinorrhea and sore throat.   Eyes: Negative for visual disturbance.  Respiratory: Positive for cough. Negative for shortness of breath.   Cardiovascular: Negative for chest pain and leg swelling.  Gastrointestinal: Positive for nausea and vomiting. Negative for abdominal pain and diarrhea.  Genitourinary: Negative for dysuria.  Musculoskeletal: Negative for back pain and neck pain.  Skin: Negative for rash.  Neurological: Positive for tremors and headaches. Negative for dizziness and light-headedness.  Hematological: Does not bruise/bleed easily.  Psychiatric/Behavioral: Negative for confusion.     Physical Exam Updated Vital Signs BP (!) 143/101    Pulse 92    Temp 97.9 F (36.6 C) (Oral)    Resp 17    Ht 1.676 m (5\' 6" )    Wt 49.9 kg    SpO2 100%    BMI 17.75 kg/m   Physical Exam Vitals signs and nursing note reviewed.  Constitutional:      General: She is not in acute distress.    Appearance: Normal appearance. She is well-developed.  HENT:     Head: Normocephalic and atraumatic.  Eyes:     Extraocular Movements: Extraocular movements intact.     Conjunctiva/sclera: Conjunctivae normal.     Pupils: Pupils are equal, round, and reactive to light.  Neck:     Musculoskeletal: Normal range of motion and neck supple.  Cardiovascular:     Rate and Rhythm: Regular rhythm. Tachycardia present.     Heart sounds: No murmur.  Pulmonary:     Effort: Pulmonary effort is normal. No respiratory distress.     Breath sounds: Normal breath sounds.  Abdominal:     Palpations: Abdomen is soft.      Tenderness: There is no abdominal tenderness.  Musculoskeletal: Normal range of motion.        General: Signs of injury present.  Skin:    General: Skin is warm and dry.     Findings: Rash present.     Comments: Patient with a macular rash  anterior part of the abdomen.  Significant bruising to upper extremity predominately left side.  Bruising appears old.  Neurological:     Mental Status: She is alert and oriented to person, place, and time.     Cranial Nerves: No cranial nerve deficit.     Sensory: No sensory deficit.     Motor: No weakness.      ED Treatments / Results  Labs (all labs ordered are listed, but only abnormal results are displayed) Labs Reviewed  CBC WITH DIFFERENTIAL/PLATELET - Abnormal; Notable for the following components:      Result Value   RDW 16.5 (*)    Platelets 122 (*)    All other components within normal limits  COMPREHENSIVE METABOLIC PANEL - Abnormal; Notable for the following components:   Potassium 3.1 (*)    Calcium 8.8 (*)    Total Protein 8.9 (*)    Albumin 5.6 (*)    AST 192 (*)    ALT 106 (*)    Anion gap 20 (*)    All other components within normal limits  LIPASE, BLOOD - Abnormal; Notable for the following components:   Lipase 57 (*)    All other components within normal limits  ETHANOL - Abnormal; Notable for the following components:   Alcohol, Ethyl (B) 210 (*)    All other components within normal limits  RAPID URINE DRUG SCREEN, HOSP PERFORMED - Abnormal; Notable for the following components:   Tetrahydrocannabinol POSITIVE (*)    All other components within normal limits  SARS CORONAVIRUS 2 (TAT 6-24 HRS)  PROTIME-INR  POC URINE PREG, ED    EKG EKG Interpretation  Date/Time:  Monday June 24 2019 07:50:27 EST Ventricular Rate:  101 PR Interval:    QRS Duration: 99 QT Interval:  342 QTC Calculation: 444 R Axis:   74 Text Interpretation: Sinus tachycardia Minimal ST depression, inferior leads Baseline wander in  lead(s) I III aVL V2 V3 Confirmed by Fredia Sorrow [id number]) on 06/24/2019 8:09:29 AM   Radiology Dg Chest Port 1 View  Result Date: 06/24/2019 CLINICAL DATA:  Cough and fever EXAM: PORTABLE CHEST 1 VIEW COMPARISON:  May 19, 2019 FINDINGS: Lungs are clear. Heart size and pulmonary vascularity are normal. No adenopathy. No bone lesions. IMPRESSION: No edema or consolidation. Electronically Signed   By: Lowella Grip III M.D.   On: 06/24/2019 08:06    Procedures Procedures (including critical care time)  Medications Ordered in ED Medications  0.9 %  sodium chloride infusion ( Intravenous Transfusing/Transfer 06/24/19 1201)  LORazepam (ATIVAN) injection 0-4 mg (1 mg Intravenous Given 06/24/19 0909)    Or  LORazepam (ATIVAN) tablet 0-4 mg ( Oral See Alternative 06/24/19 0909)  LORazepam (ATIVAN) injection 0-4 mg (has no administration in time range)    Or  LORazepam (ATIVAN) tablet 0-4 mg (has no administration in time range)  thiamine (VITAMIN B-1) tablet 100 mg ( Oral See Alternative 06/24/19 0854)    Or  thiamine (B-1) injection 100 mg (100 mg Intravenous Given 06/24/19 0854)  ondansetron (ZOFRAN) tablet 4 mg (4 mg Oral Given 06/24/19 0853)  pneumococcal 23 valent vaccine (PNEUMOVAX-23) injection 0.5 mL (has no administration in time range)  labetalol (NORMODYNE) injection 10 mg (has no administration in time range)  LORazepam (ATIVAN) injection 1 mg (1 mg Intravenous Given 06/24/19 0758)  sodium chloride 0.9 % bolus 500 mL (0 mLs Intravenous Stopped 06/24/19 0913)     Initial Impression / Assessment and Plan / ED Course  I  have reviewed the triage vital signs and the nursing notes.  Pertinent labs & imaging results that were available during my care of the patient were reviewed by me and considered in my medical decision making (see chart for details).  Clinical Course as of Jun 23 1236  Mon Jun 24, 2019  0723 BP(!): 186/135 [ET]    Clinical Course User Index [ET]  Kandice Robinsons       CRITICAL CARE Performed by: Fredia Sorrow Total critical care time: 30 minutes Critical care time was exclusive of separately billable procedures and treating other patients. Critical care was necessary to treat or prevent imminent or life-threatening deterioration. Critical care was time spent personally by me on the following activities: development of treatment plan with patient and/or surrogate as well as nursing, discussions with consultants, evaluation of patient's response to treatment, examination of patient, obtaining history from patient or surrogate, ordering and performing treatments and interventions, ordering and review of laboratory studies, ordering and review of radiographic studies, pulse oximetry and re-evaluation of patient's condition.  Patient's initial CIWA score by my scoring was around 13.  Patient had pretty significant tremor.  Had frequent episodes of nausea and vomiting.  Little bit of a headache.  No hallucinations no tactile abnormalities.  Was interesting to find the patient alcohol level was still significantly elevated the low 200s.  Already showing fairly significant symptoms.  Patient was fairly tachycardic.  With Ativan 1 mg x 2 patient's heart rate came down.  Patient able to rest but still had fairly significant resting tremor.  Discussed with hospitalist anticipate that patient will have worsening alcohol withdrawal symptoms they agreed to admit.  Patient was started on [place] protocol here.  Patient chest x-ray negative for pneumonia.  Covid testing is pending.  Patient symptoms not very consistent with COVID-19 infection.   Final Clinical Impressions(s) / ED Diagnoses   Final diagnoses:  Alcohol withdrawal syndrome with complication [facility])    ED Discharge Orders    None       Fredia Sorrow, MD 06/24/19 1243

## 2019-06-24 NOTE — ED Triage Notes (Signed)
Pt states she drinks daily (1 fifth or 18 pk of beer) and has not drank since yesterday. Has been running a fever and coughing for several weeks as well.

## 2019-06-24 NOTE — H&P (Signed)
History and Physical  Olivia Lester [id number] DOB: 05-23-1976 DOA: 06/24/2019  Referring physician: Rogene Houston MD  PCP: Patient, No Pcp Per   Chief Complaint: shaking  Pt coming from: Home  HPI: Olivia Lester is a 43 y.o. female chronic alcoholic and polysubstance abuser, multiple recent hospitalizations for pancreatitis and alcohol withdrawal presented to ED by EMS with complaints of fever, cough, malaise, shaking and alcohol withdrawal.  She is reporting that she has not had a drink of alcohol since yesterday at 4pm.  She says that she feels like she could have an alcohol withdrawal seizure.  She reports that she has been nauseated with dizziness and not feeling well.  She was noted to have a highly elevated serum alcohol level, THC positive on UDS.  Her lipase was only minimally elevated.  She has left AMA during multiple prior hospitalizations but willing to be admitted today.  She denies weight loss, hematemesis, melena, chest pain and SOB.  She is being admitted for acute alcohol withdrawal symptoms, dehydration and hypokalemia.   Review of Systems: All systems reviewed and apart from history of presenting illness, are negative.  Past Medical History:  Diagnosis Date  . Alcohol abuse   . Back injury   . Gastric ulcer   . Gastric ulcer 10/14/2013  . Hepatitis C 10/14/2013  . History of kidney stones   . 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 Pancreatitis, acute   . Panic attack   . Pneumonia   . Polysubstance abuse ([place])   . PONV (postoperative nausea and vomiting)   . Seizures ([place])    Past Surgical History:  Procedure Laterality Date  . BACK SURGERY    . BREAST SURGERY    . ESOPHAGOGASTRODUODENOSCOPY (EGD) WITH PROPOFOL N/A 10/14/2013   Procedure: ESOPHAGOGASTRODUODENOSCOPY (EGD) WITH PROPOFOL;  Surgeon: Rogene Houston, MD;  Location[facility];  Service: Endoscopy;  Laterality: N/A;  . ORIF TOE FRACTURE Right 08/08/2018   Procedure: RIGHT FOOT INTERNAL FIXATION  5TH METATARSAL;  Surgeon: Newt Minion, MD;  Location: [facility];  Service: Orthopedics;  Laterality: Right;  . TONSILLECTOMY     Social History:  reports that she has been smoking cigarettes. She has a 20.00 pack-year smoking history. She has never used smokeless tobacco. She reports current alcohol use of about 12.0 standard drinks of alcohol per week. She reports current drug use. Drugs: Marijuana, Cocaine, and Methamphetamines.  Allergies  Allergen Reactions  . Asa [Aspirin] Rash    Family History  Problem Relation Age of Onset  . Cancer Mother 53       ovarian   . Hypertension Mother   . Stroke Mother   . Early death Father   . Alcohol abuse Father     Prior to Admission medications   Medication Sig Start Date End Date Taking? Authorizing Provider  amLODipine (NORVASC) 10 MG tablet Take 1 tablet (10 mg total) by mouth daily. 04/04/19   Elgergawy, Silver Huguenin, MD  folic acid (FOLVITE) 1 MG tablet Take 1 tablet (1 mg total) by mouth daily. 04/05/19   Elgergawy, Silver Huguenin, MD  ondansetron (ZOFRAN) 4 MG tablet Take 1 tablet (4 mg total) by mouth every 6 (six) hours. 05/19/19   Triplett, Tammy, PA-C  pantoprazole (PROTONIX) 40 MG tablet Take 40 mg by mouth 2 (two) times daily.     [provider]  thiamine 100 MG tablet Take 1 tablet (100 mg total) by mouth daily. 04/05/19  Elgergawy, Silver Huguenin, MD   Physical Exam: Vitals:   06/24/19 0800 06/24/19 0830 06/24/19 0857 06/24/19 0900  BP: (!) 161/111 (!) 155/102 (!) 155/102   Pulse: (!) 104 88 88 (!) 113  Resp: (!) 32 14    Temp:      TempSrc:      SpO2: 100% 96%  98%  Weight:      Height:         General exam: Moderately built and nourished patient, lying comfortably supine on the gurney in no obvious distress.  Head, eyes and ENT: Nontraumatic and normocephalic. Pupils equally reacting to light and accommodation. Oral mucosa dry.  Neck: Supple. No JVD, carotid bruit or thyromegaly.  Lymphatics: No lymphadenopathy.   Respiratory system: Clear to auscultation. No increased work of breathing.  Cardiovascular system: S1 and S2 heard, RRR. No JVD, murmurs, gallops, clicks or pedal edema.  Gastrointestinal system: Abdomen is nondistended, soft and nontender. Normal bowel sounds heard. No organomegaly or masses appreciated.  Central nervous system: Alert and oriented. No focal neurological deficits.  Extremities: Symmetric 5 x 5 power. Peripheral pulses symmetrically felt.   Skin: No rashes or acute findings.  Musculoskeletal system: Negative exam.  Psychiatry: Pleasant and cooperative.  Labs on Admission:  Basic Metabolic Panel: Recent Labs  Lab 06/24/19 0736  NA 142  K 3.1*  CL 99  CO2 23  GLUCOSE 80  BUN 13  CREATININE 0.58  CALCIUM 8.8*   Liver Function Tests: Recent Labs  Lab 06/24/19 0736  AST 192*  ALT 106*  ALKPHOS 59  BILITOT 0.8  PROT 8.9*  ALBUMIN 5.6*   Recent Labs  Lab 06/24/19 0736  LIPASE 57*   No results for input(s): AMMONIA in the last 168 hours. CBC: Recent Labs  Lab 06/24/19 0736  WBC 6.3  NEUTROABS 4.7  HGB 13.8  HCT 41.5  MCV 100.0  PLT 122*   Cardiac Enzymes: No results for input(s): CKTOTAL, CKMB, CKMBINDEX, TROPONINI in the last 168 hours.  BNP (last 3 results) No results for input(s): PROBNP in the last 8760 hours. CBG: No results for input(s): GLUCAP in the last 168 hours.  Radiological Exams on Admission: Dg Chest Port 1 View  Result Date: 06/24/2019 CLINICAL DATA:  Cough and fever EXAM: PORTABLE CHEST 1 VIEW COMPARISON:  May 19, 2019 FINDINGS: Lungs are clear. Heart size and pulmonary vascularity are normal. No adenopathy. No bone lesions. IMPRESSION: No edema or consolidation. Electronically Signed   By: Lowella Grip III M.D.   On: 06/24/2019 08:06    Assessment/Plan Principal Problem:   Alcohol withdrawal (HCC) Active Problems:   Alcoholism ([place])   Thrombocytopenia (HCC)   Transaminitis   Tobacco dependence    Hepatitis C   Polysubstance (excluding opioids) dependence, daily use (HCC)   Cannabis abuse   Hypokalemia   Sinus tachycardia   Essential hypertension   1. Alcohol withdrawal - Pt has history of heavy alcohol consumption and is at high risk for delirium tremens.  Admit to med surg, continue CIWA protocol, continue librium 25 mg TID to try to mitigate symptoms, continue IV fluids, electrolyte replacement and supportive care.   2. Hypokalemia - check magnesium, add potassium to IV fluids, follow CMP.  3. Elevated liver enzymes - likely secondary to chronic alcohol abuse and hepatitis C. Following daily CMP.  4. Sinus tachycardia - secondary to dehydration and acute alcohol withdrawal. Following.  5. Essential hypertension - continue home amlodipine. Follow and treat as needed.  6. Polysubstance  abuse - THC was found in UDS, consulted to Education officer, museum.  7. Thrombocytopenia - secondary to chronic liver disease - follow closely, DC lovenox for platelets less than 100.    DVT Prophylaxis: lovenox  Code Status: Full   Family Communication:   Disposition Plan: med surg inpatient hospitalization    Time spent: 22 minutes   Irwin Brakeman, MD [facility] How to contact the [facility] Attending or Consulting provider [place] or covering provider during after hours [place], for this patient?  1. Check the care team in [facility] and look for a) attending/consulting TRH provider listed and b) the [facility] team listed 2. Log into [email or web address] and use [place]'s universal password to access. If you do not have the password, please contact the hospital operator. 3. Locate the [facility] provider you are looking for under [facility] and page to a number that you can be directly reached. 4. If you still have difficulty reaching the provider, please page the [facility] (Director on Call) for the Hospitalists listed on amion for assistance.

## 2019-06-25 DIAGNOSIS — F1920 Other psychoactive substance dependence, uncomplicated: Secondary | ICD-10-CM

## 2019-06-25 DIAGNOSIS — E876 Hypokalemia: Secondary | ICD-10-CM

## 2019-06-25 DIAGNOSIS — F10239 Alcohol dependence with withdrawal, unspecified: Secondary | ICD-10-CM

## 2019-06-25 DIAGNOSIS — R000 Tachycardia, unspecified: Secondary | ICD-10-CM

## 2019-06-25 DIAGNOSIS — F17200 Nicotine dependence, unspecified, uncomplicated: Secondary | ICD-10-CM

## 2019-06-25 DIAGNOSIS — F1210 Cannabis abuse, uncomplicated: Secondary | ICD-10-CM

## 2019-06-25 DIAGNOSIS — D696 Thrombocytopenia, unspecified: Secondary | ICD-10-CM

## 2019-06-25 DIAGNOSIS — I10 Essential (primary) hypertension: Secondary | ICD-10-CM

## 2019-06-25 DIAGNOSIS — B182 Chronic viral hepatitis C: Secondary | ICD-10-CM

## 2019-06-25 DIAGNOSIS — F1020 Alcohol dependence, uncomplicated: Secondary | ICD-10-CM

## 2019-06-25 LAB — CBC WITH DIFFERENTIAL/PLATELET
Abs Immature Granulocytes: 0.04 10*3/uL (ref 0.00–0.07)
Basophils Absolute: 0 10*3/uL (ref 0.0–0.1)
Basophils Relative: 0 %
Eosinophils Absolute: 0 10*3/uL (ref 0.0–0.5)
Eosinophils Relative: 1 %
HCT: 37.2 % (ref 36.0–46.0)
Hemoglobin: 12.4 g/dL (ref 12.0–15.0)
Immature Granulocytes: 1 %
Lymphocytes Relative: 23 %
Lymphs Abs: 0.9 10*3/uL (ref 0.7–4.0)
MCH: 33.4 pg (ref 26.0–34.0)
MCHC: 33.3 g/dL (ref 30.0–36.0)
MCV: 100.3 fL — ABNORMAL HIGH (ref 80.0–100.0)
Monocytes Absolute: 0.2 10*3/uL (ref 0.1–1.0)
Monocytes Relative: 6 %
Neutro Abs: 2.7 10*3/uL (ref 1.7–7.7)
Neutrophils Relative %: 69 %
Platelets: 72 10*3/uL — ABNORMAL LOW (ref 150–400)
RBC: 3.71 MIL/uL — ABNORMAL LOW (ref 3.87–5.11)
RDW: 15.9 % — ABNORMAL HIGH (ref 11.5–15.5)
WBC: 3.9 10*3/uL — ABNORMAL LOW (ref 4.0–10.5)
nRBC: 0 % (ref 0.0–0.2)

## 2019-06-25 LAB — COMPREHENSIVE METABOLIC PANEL
ALT: 67 U/L — ABNORMAL HIGH (ref 0–44)
AST: 90 U/L — ABNORMAL HIGH (ref 15–41)
Albumin: 4 g/dL (ref 3.5–5.0)
Alkaline Phosphatase: 48 U/L (ref 38–126)
Anion gap: 11 (ref 5–15)
BUN: 9 mg/dL (ref 6–20)
CO2: 25 mmol/L (ref 22–32)
Calcium: 8.6 mg/dL — ABNORMAL LOW (ref 8.9–10.3)
Chloride: 99 mmol/L (ref 98–111)
Creatinine, Ser: 0.47 mg/dL (ref 0.44–1.00)
GFR calc Af Amer: >60 mL/min (ref >60)
GFR calc non Af Amer: >60 mL/min (ref >60)
Glucose, Bld: 89 mg/dL (ref 70–99)
Potassium: 3.2 mmol/L — ABNORMAL LOW (ref 3.5–5.1)
Sodium: 135 mmol/L (ref 135–145)
Total Bilirubin: 0.8 mg/dL (ref 0.3–1.2)
Total Protein: 6.6 g/dL (ref 6.5–8.1)

## 2019-06-25 LAB — MAGNESIUM: Magnesium: 1.9 mg/dL (ref 1.7–2.4)

## 2019-06-25 LAB — ETHANOL: Alcohol, Ethyl (B): <10 mg/dL (ref <10)

## 2019-06-25 LAB — PROTIME-INR
INR: 0.9 (ref 0.8–1.2)
Prothrombin Time: 12.2 seconds (ref 11.4–15.2)

## 2019-06-25 LAB — LIPASE, BLOOD: Lipase: 40 U/L (ref 11–51)

## 2019-06-25 MED ORDER — POTASSIUM CHLORIDE CRYS ER 20 MEQ PO TBCR
40.0000 meq | EXTENDED_RELEASE_TABLET | Freq: Once | ORAL | Status: AC
  Administered 2019-06-25: 40 meq via ORAL
  Filled 2019-06-25: qty 2

## 2019-06-25 NOTE — TOC Initial Note (Signed)
Transition of Care [facility]) - Initial/Assessment Note    Patient Details  Name: Olivia Lester MRN: [id number] Date of Birth: 1975/08/09  Transition of [facility]) CM/SW Contact:    Ihor Gully, LCSW Phone Number: 06/25/2019, 4:45 PM  Clinical Narrative:                 Patient admitted from home. High risk due to number of ED visits.   Patient requested that letter be prepared and sent to the courts as she missed court on 06/24/19. Letter faxed to clerk's office at [phone]. Patient provided original letter and fax confirmation for her records.  Patient provided both inpatient and out patient SA resources.  Expected Discharge Plan: Home/Self Care Barriers to Discharge: No Barriers Identified   Patient Goals and CMS Choice Patient states their goals for this hospitalization and ongoing recovery are:: Wants court letter      Expected Discharge Plan and Services Expected Discharge Plan: Home/Self Care                                              Prior Living Arrangements/Services     Patient language and need for interpreter reviewed:: Yes Do you feel safe going back to the place where you live?: Yes      Need for Family Participation in Patient Care: Yes (Comment) Care giver support system in place?: Yes (comment)   Criminal Activity/Legal Involvement Pertinent to Current Situation/Hospitalization: No - Comment as needed  Activities of Daily Living Home Assistive Devices/Equipment: Cane (specify quad or straight) ADL Screening (condition at time of admission) Patient's cognitive ability adequate to safely complete daily activities?: Yes Is the patient deaf or have difficulty hearing?: Yes Does the patient have difficulty seeing, even when wearing glasses/contacts?: No Does the patient have difficulty concentrating, remembering, or making decisions?: No Patient able to express need for assistance with ADLs?: Yes Does the patient have difficulty dressing or  bathing?: No Independently performs ADLs?: Yes (appropriate for developmental age) Does the patient have difficulty walking or climbing stairs?: No Weakness of Legs: None Weakness of Arms/Hands: None  Permission Sought/Granted   Permission granted to share information with : Yes, Verbal Permission Granted  Share Information with NAME: [facility] of court office        Permission granted to share info w Contact Information: fax [phone]  Emotional Assessment Appearance:: Appears stated age   Affect (typically observed): Appropriate Orientation: : Oriented to Self, Oriented to Place, Oriented to  Time, Oriented to Situation Alcohol / Substance Use: Alcohol Use, Illicit Drugs Psych Involvement: No (comment)  Admission diagnosis:  Alcohol withdrawal syndrome with complication [facility]) [id number] Patient Active Problem List   Diagnosis Date Noted  . Alcohol withdrawal ([place]) 06/24/2019  . Sinus tachycardia 06/24/2019  . Essential hypertension 06/24/2019  . Hypokalemia 03/31/2019  . Cannabis abuse 03/11/2019  . Elevated transaminase level   . Alcohol abuse   . Polysubstance (excluding opioids) dependence, daily use ([place]) 09/13/2018  . Closed displaced fracture of fifth metatarsal bone of right foot 08/02/2018  . Dental abscess 12/25/2017  . Gastric ulcer 10/14/2013  . Transaminitis 10/14/2013  . Tobacco dependence 10/14/2013  . Hepatitis C 10/14/2013  . Thrombocytopenia ([place]) 10/13/2013  . Duodenitis 10/12/2013  . Alcoholism ([place]) 10/12/2013  . Abdominal pain, epigastric 10/12/2013  . ANKLE PAIN 01/01/2008   PCP:  Patient, No  Pcp Per Pharmacy:   [facility] [address] [place] [place] [address] Phone: [phone] Fax: [phone]  [place], [place] - [place] [address] [place] [address] [place] [address] Phone: [phone] Fax:  [phone]     Social Determinants of Health (SDOH) Interventions    Readmission Risk Interventions No flowsheet data found.

## 2019-06-25 NOTE — Clinical Social Work Note (Signed)
Patient requested that letter be prepared and sent to the courts as she missed court on 06/24/19. Letter faxed to clerk's office at [phone]. Patient provided original letter and fax confirmation for her records.  Patient provided both inpatient and out patient SA resources.   TOC signing off.   Juliene Kirsh, Clydene Pugh, LCSW

## 2019-06-25 NOTE — Progress Notes (Signed)
Initial Nutrition Assessment  DOCUMENTATION CODES:      INTERVENTION:  Ensure Enlive po BID, each supplement provides 350 kcal and 20 grams of protein   Nursing obtained re-wt for RD- actual wt of 58 kg reported.   NUTRITION DIAGNOSIS:   Inadequate oral intake related to poor appetite as evidenced by per patient/family report(alchohol withdrawl, poor intake-dehydrated on admission).   GOAL:  Patient will meet greater than or equal to 90% of their needs   MONITOR:  Diet advancement, Supplement acceptance, PO intake, Weight trends   REASON FOR ASSESSMENT:   Malnutrition Screening Tool    ASSESSMENT: Patient is a 43 yo female with hx of ETOH, THC and tobacco abuse. Presents alcohol withdrawal symptoms, dehydration and hypokalemia.  Hx of pancreatitis, Hepatitis C and alcohol withdrawal. Patient reports decrease in intake the past 1-2 weeks.   Patient diet had advanced to full liquids. Intake is poor per patient. She is not feeling like eating. She is drinking the [employer] and reports consuming an Ensure Enlive last night. At risk for malnutrition due to her chronic ETOH/polysubstance abuse    Initial wt entry of 25 kg was incorrect. NT obtained re-wt of 58 kg. Usual  Weight has been ranging between 48-57 kg the past 8 months.   Medications reviewed and include: librium,Folvite, MVI, Nicoderm, Protonix and thiamine.  Labs: BMP Latest Ref Rng & Units 06/25/2019 06/24/2019 05/19/2019  Glucose 70 - 99 mg/dL 89 80 90  BUN 6 - 20 mg/dL 9 13 8   Creatinine 0.44 - 1.00 mg/dL 0.47 0.58 0.55  BUN/Creat Ratio 9 - 23 - - -  Sodium 135 - 145 mmol/L 135 142 136  Potassium 3.5 - 5.1 mmol/L 3.2(L) 3.1(L) 3.4(L)  Chloride 98 - 111 mmol/L 99 99 97(L)  CO2 22 - 32 mmol/L 25 23 23   Calcium 8.9 - 10.3 mg/dL 8.6(L) 8.8(L) 8.3(L)     NUTRITION - FOCUSED PHYSICAL EXAM:  Unable to complete Nutrition-Focused physical exam at this time.    Diet Order:   Diet Order            Diet full  liquid Room service appropriate? Yes; Fluid consistency: Thin  Diet effective now              EDUCATION NEEDS:   Not appropriate for education at this time Skin:  Skin Assessment: Reviewed RN Assessment  Last BM:  patient reported episode of loose stool this morning  Height:   Ht Readings from Last 1 Encounters:  06/24/19 5\' 6"  (1.676 m)    Weight:   Wt Readings from Last 1 Encounters:  06/25/19 58 kg    Ideal Body Weight:  59 kg  BMI:  Body mass index is 20.64 kg/m.  Estimated Nutritional Needs:   Kcal:  1600-1800  Protein:  70-75 gr  Fluid:  1.6-1.8 liters daily   Colman Cater MS,RD,CSG,LDN Office: [phone] Pager: [phone]

## 2019-06-25 NOTE — Progress Notes (Signed)
Patient left AMA. Patient educated on the risk of leaving against medical advice. Verbalized an understanding. AMA papers signed.  IV removed. 2x2 gauze applied. Patient stable and retrieved all belonging upon departure. Dr. Olevia Bowens notified via [place].

## 2019-06-25 NOTE — Progress Notes (Signed)
PROGRESS NOTE [facility]   Olivia Lester  [id number]  DOB: 1976/04/02  DOA: 06/24/2019 PCP: Patient, No Pcp Per   Brief Admission Hx: 43 y.o. female chronic alcoholic and polysubstance abuser, multiple recent hospitalizations for pancreatitis and alcohol withdrawal presented to ED by EMS with complaints of fever, cough, malaise, shaking and alcohol withdrawal.   MDM/Assessment & Plan:   1. Acute alcohol withdrawal-patient seems to be responding well to treatments.  Her CIWA is coming down. Continue CIWA. Continue scheduled low dose librium.  Continue vitamin supplements.  If patient continues to improve she hopefully will be able to discharge home tomorrow 2. Hypokalemia-potassium being replaced and IV fluids and also oral potassium has been given and magnesium has been replaced. 3. Thrombocytopenia-secondary to chronic liver disease-with drop in platelets will DC Lovenox and continue SCDs for DVT prophylaxis.  Patient having no bleeding at this time. 4. Elevated liver enzymes-secondary to chronic alcohol abuse and hepatitis C.  Enzymes have been trending down.  She will need to follow-up with GI after discharge. 5. Sinus tachycardia-resolved with IV fluid hydration and treatment of alcohol withdrawal. 6. Essential hypertension-blood pressure seem to be controlled with amlodipine. 7. Polysubstance abuse-THC was found in the UDS and social worker was consulted. 8. Social: Patient requesting that her lawyer be contacted as she apparently missed a court date yesterday.  I am directing this to the Education officer, museum.  DVT prophylaxis: SCDs Code Status: Full Family Communication: No family at bedside but I fully updated the patient and she verbalized understanding Disposition Plan: Home tomorrow if she remains stable   Consultants:  Education officer, museum  Procedures:  N/A  Antimicrobials:  N/A  Subjective: Patient says that she still has shakes but it is much less than it had been  yesterday.  Objective: Vitals:   06/25/19 0430 06/25/19 0700 06/25/19 1040 06/25/19 1233  BP: (!) 131/97 (!) 138/91 (!) 136/98   Pulse: 85 91 92   Resp:      Temp:      TempSrc:      SpO2:      Weight:    58 kg  Height:        Intake/Output Summary (Last 24 hours) at 06/25/2019 1358 Last data filed at 06/25/2019 0500 Gross per 24 hour  Intake 1326.12 ml  Output --  Net 1326.12 ml   Filed Weights   06/24/19 0717 06/24/19 1300 06/25/19 1233  Weight: 49.9 kg 25.4 kg 58 kg     REVIEW OF SYSTEMS  As per history otherwise all reviewed and reported negative  Exam:  General exam: Chronically ill-appearing thin emaciated female lying in the bed she is awake and alert and oriented x3 in no apparent distress.  She does have a resting tremor and intentional tremor. Respiratory system: Clear. No increased work of breathing. Cardiovascular system: S1 & S2 heard. No JVD, murmurs, gallops, clicks or pedal edema. Gastrointestinal system: Abdomen is nondistended, soft and nontender. Normal bowel sounds heard. Central nervous system: Alert and oriented. No focal neurological deficits. Extremities: no CCE.  Data Reviewed: Basic Metabolic Panel: Recent Labs  Lab 06/24/19 0736 06/24/19 0738 06/25/19 0442  NA 142  --  135  K 3.1*  --  3.2*  CL 99  --  99  CO2 23  --  25  GLUCOSE 80  --  89  BUN 13  --  9  CREATININE 0.58  --  0.47  CALCIUM 8.8*  --  8.6*  MG  --  1.7 1.9  PHOS  --  2.2*  --    Liver Function Tests: Recent Labs  Lab 06/24/19 0736 06/25/19 0442  AST 192* 90*  ALT 106* 67*  ALKPHOS 59 48  BILITOT 0.8 0.8  PROT 8.9* 6.6  ALBUMIN 5.6* 4.0   Recent Labs  Lab 06/24/19 0736 06/25/19 0442  LIPASE 57* 40   No results for input(s): AMMONIA in the last 168 hours. CBC: Recent Labs  Lab 06/24/19 0736 06/25/19 0442  WBC 6.3 3.9*  NEUTROABS 4.7 2.7  HGB 13.8 12.4  HCT 41.5 37.2  MCV 100.0 100.3*  PLT 122* 72*   Cardiac Enzymes: No results for  input(s): CKTOTAL, CKMB, CKMBINDEX, TROPONINI in the last 168 hours. CBG (last 3)  No results for input(s): GLUCAP in the last 72 hours. Recent Results (from the past 240 hour(s))  SARS CORONAVIRUS 2 (TAT 6-24 HRS) Nasopharyngeal Nasopharyngeal Swab     Status: None   Collection Time: 06/24/19 10:15 AM   Specimen: Nasopharyngeal Swab  Result Value Ref Range Status   SARS Coronavirus 2 NEGATIVE NEGATIVE Final    Comment: (NOTE) SARS-CoV-2 target nucleic acids are NOT DETECTED. The SARS-CoV-2 RNA is generally detectable in upper and lower respiratory specimens during the acute phase of infection. Negative results do not preclude SARS-CoV-2 infection, do not rule out co-infections with other pathogens, and should not be used as the sole basis for treatment or other patient management decisions. Negative results must be combined with clinical observations, patient history, and epidemiological information. The expected result is Negative. Fact Sheet for Patients: SugarRoll.be Fact Sheet for Healthcare Providers: https://www.woods-mathews.com/ This test is not yet approved or cleared by the [place] FDA and  has been authorized for detection and/or diagnosis of SARS-CoV-2 by FDA under an Emergency Use Authorization (EUA). This EUA will remain  in effect (meaning this test can be used) for the duration of the COVID-19 declaration under Section 56 4(b)(1) of the Act, 21 U.S.C. section 360bbb-3(b)(1), unless the authorization is terminated or revoked sooner. Performed at [facility], [place] [address], [place], [place] [address]      Studies: Dg Chest Port 1 View  Result Date: 06/24/2019 CLINICAL DATA:  Cough and fever EXAM: PORTABLE CHEST 1 VIEW COMPARISON:  May 19, 2019 FINDINGS: Lungs are clear. Heart size and pulmonary vascularity are normal. No adenopathy. No bone lesions. IMPRESSION: No edema or consolidation. Electronically  Signed   By: Lowella Grip III M.D.   On: 06/24/2019 08:06     Scheduled Meds:  amLODipine  10 mg Oral Daily   chlordiazePOXIDE  10 mg Oral TID   enoxaparin (LOVENOX) injection  40 mg Subcutaneous Q24H   feeding supplement (ENSURE ENLIVE)  237 mL Oral BID BM   folic acid  1 mg Oral Daily   multivitamin with minerals  1 tablet Oral Daily   nicotine  21 mg Transdermal Daily   pantoprazole  40 mg Oral BID   pneumococcal 23 valent vaccine  0.5 mL Intramuscular Tomorrow-1000   thiamine  100 mg Oral Daily   Or   thiamine  100 mg Intravenous Daily   Continuous Infusions:  0.9 % NaCl with KCl 20 mEq / L 100 mL/hr at 06/25/19 1123    Principal Problem:   Alcohol withdrawal ([place]) Active Problems:   Alcoholism ([place])   Thrombocytopenia (HCC)   Transaminitis   Tobacco dependence   Hepatitis C   Polysubstance (excluding opioids) dependence, daily use ([place])   Cannabis  abuse   Hypokalemia   Sinus tachycardia   Essential hypertension   Time spent:   Irwin Brakeman, MD [facility] 06/25/2019, 1:58 PM    LOS: 1 day  How to contact the [facility] Attending or Consulting provider [place] or covering provider during after hours [place], for this patient?  1. Check the care team in [facility] and look for a) attending/consulting TRH provider listed and b) the [facility] team listed 2. Log into [email or web address] and use [place]'s universal password to access. If you do not have the password, please contact the hospital operator. 3. Locate the [facility] provider you are looking for under [facility] and page to a number that you can be directly reached. 4. If you still have difficulty reaching the provider, please page the [facility] (Director on Call) for the Hospitalists listed on amion for assistance.

## 2019-06-26 NOTE — Discharge Summary (Signed)
Physician Discharge Summary  Olivia Lester [id number] DOB: [birth year] DOA: 06/24/2019  PCP: Patient, No Pcp Per  Admit date: 06/24/2019 Discharge date: 06/25/2019  Admitted From: Home  Disposition: [place]     Brief Hospitalization Summary: Please see all hospital notes, images, labs for full details of the hospitalization. The patient apparently has left against medical advice noted at 8:26 pm.  Pt was admitted with dehydration and acute alcohol withdrawal and was being treated with supportive care and CIWA protocol.  She remains very high risk for readmission and poor outcomes due to ongoing substance abuse and poor compliance with medical treatments.  Discharge Diagnoses:  Principal Problem:   Alcohol withdrawal ([place]) Active Problems:   Alcoholism ([place])   Thrombocytopenia (HCC)   Transaminitis   Tobacco dependence   Hepatitis C   Polysubstance (excluding opioids) dependence, daily use (HCC)   Cannabis abuse   Hypokalemia   Sinus tachycardia   Essential hypertension   Discharge Instructions:  Allergies as of 06/25/2019      Reactions   Toradol [ketorolac Tromethamine] Other (See Comments)   Patient advised not to take due to ulcers   Asa [aspirin] Rash      Medication List    ASK your doctor about these medications   multivitamin with minerals Tabs tablet Take 1 tablet by mouth daily.   pantoprazole 40 MG tablet Commonly known as: PROTONIX Take 40 mg by mouth 2 (two) times daily.       Allergies  Allergen Reactions  . Toradol [Ketorolac Tromethamine] Other (See Comments)    Patient advised not to take due to ulcers  . Asa [Aspirin] Rash   Allergies as of 06/25/2019      Reactions   Toradol [ketorolac Tromethamine] Other (See Comments)   Patient advised not to take due to ulcers   Asa [aspirin] Rash      Medication List    ASK your doctor about these medications   multivitamin with minerals Tabs tablet Take 1  tablet by mouth daily.   pantoprazole 40 MG tablet Commonly known as: PROTONIX Take 40 mg by mouth 2 (two) times daily.       Procedures/Studies: Dg Chest Port 1 View  Result Date: 06/24/2019 CLINICAL DATA:  Cough and fever EXAM: PORTABLE CHEST 1 VIEW COMPARISON:  May 19, 2019 FINDINGS: Lungs are clear. Heart size and pulmonary vascularity are normal. No adenopathy. No bone lesions. IMPRESSION: No edema or consolidation. Electronically Signed   By: Lowella Grip III M.D.   On: 06/24/2019 08:06      Subjective:   Discharge Exam: Vitals:   06/25/19 1848 06/25/19 1941  BP:    Pulse: 82   Resp:    Temp:    SpO2:  98%   Vitals:   06/25/19 1530 06/25/19 1632 06/25/19 1848 06/25/19 1941  BP:      Pulse: 88 84 82   Resp:      Temp:      TempSrc:      SpO2:    98%  Weight:      Height:         The results of significant diagnostics from this hospitalization (including imaging, microbiology, ancillary and laboratory) are listed below for reference.     Microbiology: Recent Results (from the past 240 hour(s))  SARS CORONAVIRUS 2 (TAT 6-24 HRS) Nasopharyngeal Nasopharyngeal Swab     Status: None   Collection Time: 06/24/19 10:15 AM   Specimen: Nasopharyngeal  Swab  Result Value Ref Range Status   SARS Coronavirus 2 NEGATIVE NEGATIVE Final    Comment: (NOTE) SARS-CoV-2 target nucleic acids are NOT DETECTED. The SARS-CoV-2 RNA is generally detectable in upper and lower respiratory specimens during the acute phase of infection. Negative results do not preclude SARS-CoV-2 infection, do not rule out co-infections with other pathogens, and should not be used as the sole basis for treatment or other patient management decisions. Negative results must be combined with clinical observations, patient history, and epidemiological information. The expected result is Negative. Fact Sheet for Patients: SugarRoll.be Fact Sheet for Healthcare  Providers: https://www.woods-mathews.com/ This test is not yet approved or cleared by the [place] FDA and  has been authorized for detection and/or diagnosis of SARS-CoV-2 by FDA under an Emergency Use Authorization (EUA). This EUA will remain  in effect (meaning this test can be used) for the duration of the COVID-19 declaration under Section 56 4(b)(1) of the Act, 21 U.S.C. section 360bbb-3(b)(1), unless the authorization is terminated or revoked sooner. Performed at [facility], [place] [address], [place], [place] [address]      Labs: BNP (last 3 results) No results for input(s): BNP in the last 8760 hours. Basic Metabolic Panel: Recent Labs  Lab 06/24/19 0736 06/24/19 0738 06/25/19 0442  NA 142  --  135  K 3.1*  --  3.2*  CL 99  --  99  CO2 23  --  25  GLUCOSE 80  --  89  BUN 13  --  9  CREATININE 0.58  --  0.47  CALCIUM 8.8*  --  8.6*  MG  --  1.7 1.9  PHOS  --  2.2*  --    Liver Function Tests: Recent Labs  Lab 06/24/19 0736 06/25/19 0442  AST 192* 90*  ALT 106* 67*  ALKPHOS 59 48  BILITOT 0.8 0.8  PROT 8.9* 6.6  ALBUMIN 5.6* 4.0   Recent Labs  Lab 06/24/19 0736 06/25/19 0442  LIPASE 57* 40   No results for input(s): AMMONIA in the last 168 hours. CBC: Recent Labs  Lab 06/24/19 0736 06/25/19 0442  WBC 6.3 3.9*  NEUTROABS 4.7 2.7  HGB 13.8 12.4  HCT 41.5 37.2  MCV 100.0 100.3*  PLT 122* 72*   Cardiac Enzymes: No results for input(s): CKTOTAL, CKMB, CKMBINDEX, TROPONINI in the last 168 hours. BNP: Invalid input(s): POCBNP CBG: No results for input(s): GLUCAP in the last 168 hours. D-Dimer No results for input(s): DDIMER in the last 72 hours. Hgb A1c No results for input(s): HGBA1C in the last 72 hours. Lipid Profile No results for input(s): CHOL, HDL, LDLCALC, TRIG, CHOLHDL, LDLDIRECT in the last 72 hours. Thyroid function studies No results for input(s): TSH, T4TOTAL, T3FREE, THYROIDAB in the last 72  hours.  Invalid input(s): FREET3 Anemia work up No results for input(s): VITAMINB12, FOLATE, FERRITIN, TIBC, IRON, RETICCTPCT in the last 72 hours. Urinalysis    Component Value Date/Time   COLORURINE YELLOW 05/19/2019 1312   APPEARANCEUR HAZY (A) 05/19/2019 1312   APPEARANCEUR Clear 12/07/2013 0133   LABSPEC 1.006 05/19/2019 1312   LABSPEC 1.006 12/07/2013 0133   PHURINE 6.0 05/19/2019 1312   GLUCOSEU NEGATIVE 05/19/2019 1312   GLUCOSEU Negative 12/07/2013 0133   HGBUR SMALL (A) 05/19/2019 1312   BILIRUBINUR NEGATIVE 05/19/2019 1312   BILIRUBINUR Negative 12/07/2013 0133   KETONESUR NEGATIVE 05/19/2019 1312   PROTEINUR 30 (A) 05/19/2019 1312   UROBILINOGEN 0.2 04/28/2015 1559   NITRITE POSITIVE (  A) 05/19/2019 1312   LEUKOCYTESUR SMALL (A) 05/19/2019 1312   LEUKOCYTESUR Negative 12/07/2013 0133   Sepsis Labs Invalid input(s): PROCALCITONIN,  WBC,  LACTICIDVEN Microbiology Recent Results (from the past 240 hour(s))  SARS CORONAVIRUS 2 (TAT 6-24 HRS) Nasopharyngeal Nasopharyngeal Swab     Status: None   Collection Time: 06/24/19 10:15 AM   Specimen: Nasopharyngeal Swab  Result Value Ref Range Status   SARS Coronavirus 2 NEGATIVE NEGATIVE Final    Comment: (NOTE) SARS-CoV-2 target nucleic acids are NOT DETECTED. The SARS-CoV-2 RNA is generally detectable in upper and lower respiratory specimens during the acute phase of infection. Negative results do not preclude SARS-CoV-2 infection, do not rule out co-infections with other pathogens, and should not be used as the sole basis for treatment or other patient management decisions. Negative results must be combined with clinical observations, patient history, and epidemiological information. The expected result is Negative. Fact Sheet for Patients: SugarRoll.be Fact Sheet for Healthcare Providers: https://www.woods-mathews.com/ This test is not yet approved or cleared by the [place] FDA and  has been authorized for detection and/or diagnosis of SARS-CoV-2 by FDA under an Emergency Use Authorization (EUA). This EUA will remain  in effect (meaning this test can be used) for the duration of the COVID-19 declaration under Section 56 4(b)(1) of the Act, 21 U.S.C. section 360bbb-3(b)(1), unless the authorization is terminated or revoked sooner. Performed at [facility], [place] [address] [place], [place] [address]    Time coordinating discharge:   SIGNED:  Irwin Brakeman, MD  [facility] 06/26/2019, 10:08 AM How to contact the [facility] Attending or Consulting provider [place] or covering provider during after hours [place], for this patient?  1. Check the care team in [facility] and look for a) attending/consulting TRH provider listed and b) the [facility] team listed 2. Log into [email or web address] and use [place]'s universal password to access. If you do not have the password, please contact the hospital operator. 3. Locate the [facility] provider you are looking for under [facility] and page to a number that you can be directly reached. 4. If you still have difficulty reaching the provider, please page the [facility] (Director on Call) for the Hospitalists listed on amion for assistance.

## 2019-09-04 ENCOUNTER — Inpatient Hospital Stay (HOSPITAL_COMMUNITY)
Admission: EM | Admit: 2019-09-04 | Discharge: 2019-09-07 | DRG: 391 | Disposition: A | Discharge status: Home / Self Care | Payer: Self-pay | Attending: Internal Medicine | Admitting: Internal Medicine

## 2019-09-04 ENCOUNTER — Other Ambulatory Visit: Payer: Self-pay

## 2019-09-04 ENCOUNTER — Emergency Department (HOSPITAL_COMMUNITY): Payer: Self-pay

## 2019-09-04 ENCOUNTER — Observation Stay (HOSPITAL_COMMUNITY): Payer: Self-pay

## 2019-09-04 ENCOUNTER — Encounter (HOSPITAL_COMMUNITY): Payer: Self-pay | Admitting: Emergency Medicine

## 2019-09-04 DIAGNOSIS — F10239 Alcohol dependence with withdrawal, unspecified: Secondary | ICD-10-CM | POA: Diagnosis present

## 2019-09-04 DIAGNOSIS — Z87442 Personal history of urinary calculi: Secondary | ICD-10-CM

## 2019-09-04 DIAGNOSIS — F10929 Alcohol use, unspecified with intoxication, unspecified: Secondary | ICD-10-CM | POA: Diagnosis present

## 2019-09-04 DIAGNOSIS — F17210 Nicotine dependence, cigarettes, uncomplicated: Secondary | ICD-10-CM | POA: Diagnosis present

## 2019-09-04 DIAGNOSIS — B9620 Unspecified Escherichia coli [E. coli] as the cause of diseases classified elsewhere: Secondary | ICD-10-CM | POA: Diagnosis present

## 2019-09-04 DIAGNOSIS — Z8701 Personal history of pneumonia (recurrent): Secondary | ICD-10-CM

## 2019-09-04 DIAGNOSIS — Z8619 Personal history of other infectious and parasitic diseases: Secondary | ICD-10-CM

## 2019-09-04 DIAGNOSIS — K2920 Alcoholic gastritis without bleeding: Principal | ICD-10-CM

## 2019-09-04 DIAGNOSIS — Y906 Blood alcohol level of 120-199 mg/100 ml: Secondary | ICD-10-CM | POA: Diagnosis present

## 2019-09-04 DIAGNOSIS — E876 Hypokalemia: Secondary | ICD-10-CM | POA: Diagnosis present

## 2019-09-04 DIAGNOSIS — K8689 Other specified diseases of pancreas: Secondary | ICD-10-CM | POA: Diagnosis present

## 2019-09-04 DIAGNOSIS — F410 Panic disorder [episodic paroxysmal anxiety] without agoraphobia: Secondary | ICD-10-CM | POA: Diagnosis present

## 2019-09-04 DIAGNOSIS — F10229 Alcohol dependence with intoxication, unspecified: Secondary | ICD-10-CM | POA: Diagnosis present

## 2019-09-04 DIAGNOSIS — R748 Abnormal levels of other serum enzymes: Secondary | ICD-10-CM

## 2019-09-04 DIAGNOSIS — K7010 Alcoholic hepatitis without ascites: Secondary | ICD-10-CM | POA: Diagnosis present

## 2019-09-04 DIAGNOSIS — K760 Fatty (change of) liver, not elsewhere classified: Secondary | ICD-10-CM | POA: Diagnosis present

## 2019-09-04 DIAGNOSIS — Z20822 Contact with and (suspected) exposure to covid-19: Secondary | ICD-10-CM | POA: Diagnosis present

## 2019-09-04 DIAGNOSIS — R112 Nausea with vomiting, unspecified: Secondary | ICD-10-CM

## 2019-09-04 DIAGNOSIS — F1210 Cannabis abuse, uncomplicated: Secondary | ICD-10-CM | POA: Diagnosis present

## 2019-09-04 DIAGNOSIS — F10231 Alcohol dependence with withdrawal delirium: Secondary | ICD-10-CM

## 2019-09-04 DIAGNOSIS — F10931 Alcohol use, unspecified with withdrawal delirium: Secondary | ICD-10-CM

## 2019-09-04 DIAGNOSIS — G8929 Other chronic pain: Secondary | ICD-10-CM | POA: Diagnosis present

## 2019-09-04 DIAGNOSIS — Z8711 Personal history of peptic ulcer disease: Secondary | ICD-10-CM

## 2019-09-04 DIAGNOSIS — F1110 Opioid abuse, uncomplicated: Secondary | ICD-10-CM | POA: Diagnosis present

## 2019-09-04 DIAGNOSIS — F419 Anxiety disorder, unspecified: Secondary | ICD-10-CM | POA: Diagnosis present

## 2019-09-04 DIAGNOSIS — D696 Thrombocytopenia, unspecified: Secondary | ICD-10-CM | POA: Diagnosis present

## 2019-09-04 DIAGNOSIS — N390 Urinary tract infection, site not specified: Secondary | ICD-10-CM

## 2019-09-04 DIAGNOSIS — Z681 Body mass index (BMI) 19 or less, adult: Secondary | ICD-10-CM

## 2019-09-04 DIAGNOSIS — Z886 Allergy status to analgesic agent status: Secondary | ICD-10-CM

## 2019-09-04 DIAGNOSIS — F10920 Alcohol use, unspecified with intoxication, uncomplicated: Secondary | ICD-10-CM

## 2019-09-04 DIAGNOSIS — Z8249 Family history of ischemic heart disease and other diseases of the circulatory system: Secondary | ICD-10-CM

## 2019-09-04 DIAGNOSIS — D6959 Other secondary thrombocytopenia: Secondary | ICD-10-CM | POA: Diagnosis present

## 2019-09-04 DIAGNOSIS — I10 Essential (primary) hypertension: Secondary | ICD-10-CM | POA: Diagnosis present

## 2019-09-04 DIAGNOSIS — E43 Unspecified severe protein-calorie malnutrition: Secondary | ICD-10-CM | POA: Diagnosis present

## 2019-09-04 DIAGNOSIS — F17200 Nicotine dependence, unspecified, uncomplicated: Secondary | ICD-10-CM | POA: Diagnosis present

## 2019-09-04 DIAGNOSIS — Z811 Family history of alcohol abuse and dependence: Secondary | ICD-10-CM

## 2019-09-04 DIAGNOSIS — Z9181 History of falling: Secondary | ICD-10-CM

## 2019-09-04 LAB — URINALYSIS, ROUTINE W REFLEX MICROSCOPIC
Bilirubin Urine: NEGATIVE
Glucose, UA: NEGATIVE mg/dL
Ketones, ur: NEGATIVE mg/dL
Nitrite: POSITIVE — AB
Protein, ur: 30 mg/dL — AB
Specific Gravity, Urine: 1.02 (ref 1.005–1.030)
WBC, UA: >50 WBC/hpf — ABNORMAL HIGH (ref 0–5)
pH: 6 (ref 5.0–8.0)

## 2019-09-04 LAB — POC URINE PREG, ED: Preg Test, Ur: NEGATIVE

## 2019-09-04 LAB — CBC
HCT: 37.5 % (ref 36.0–46.0)
Hemoglobin: 12.9 g/dL (ref 12.0–15.0)
MCH: 35.4 pg — ABNORMAL HIGH (ref 26.0–34.0)
MCHC: 34.4 g/dL (ref 30.0–36.0)
MCV: 103 fL — ABNORMAL HIGH (ref 80.0–100.0)
Platelets: 72 10*3/uL — ABNORMAL LOW (ref 150–400)
RBC: 3.64 MIL/uL — ABNORMAL LOW (ref 3.87–5.11)
RDW: 14.5 % (ref 11.5–15.5)
WBC: 5.8 10*3/uL (ref 4.0–10.5)
nRBC: 0 % (ref 0.0–0.2)

## 2019-09-04 LAB — RAPID URINE DRUG SCREEN, HOSP PERFORMED
Amphetamines: NOT DETECTED
Barbiturates: NOT DETECTED
Benzodiazepines: NOT DETECTED
Cocaine: NOT DETECTED
Opiates: NOT DETECTED
Tetrahydrocannabinol: POSITIVE — AB

## 2019-09-04 LAB — COMPREHENSIVE METABOLIC PANEL
ALT: 87 U/L — ABNORMAL HIGH (ref 0–44)
AST: 271 U/L — ABNORMAL HIGH (ref 15–41)
Albumin: 4.5 g/dL (ref 3.5–5.0)
Alkaline Phosphatase: 73 U/L (ref 38–126)
Anion gap: 15 (ref 5–15)
BUN: 8 mg/dL (ref 6–20)
CO2: 25 mmol/L (ref 22–32)
Calcium: 8.7 mg/dL — ABNORMAL LOW (ref 8.9–10.3)
Chloride: 100 mmol/L (ref 98–111)
Creatinine, Ser: 0.45 mg/dL (ref 0.44–1.00)
GFR calc Af Amer: >60 mL/min (ref >60)
GFR calc non Af Amer: >60 mL/min (ref >60)
Glucose, Bld: 79 mg/dL (ref 70–99)
Potassium: 3.4 mmol/L — ABNORMAL LOW (ref 3.5–5.1)
Sodium: 140 mmol/L (ref 135–145)
Total Bilirubin: 0.7 mg/dL (ref 0.3–1.2)
Total Protein: 7.6 g/dL (ref 6.5–8.1)

## 2019-09-04 LAB — RESPIRATORY PANEL BY RT PCR (FLU A&B, COVID)
Influenza A by PCR: NEGATIVE
Influenza B by PCR: NEGATIVE
SARS Coronavirus 2 by RT PCR: NEGATIVE

## 2019-09-04 LAB — ETHANOL: Alcohol, Ethyl (B): 156 mg/dL — ABNORMAL HIGH (ref <10)

## 2019-09-04 LAB — PHOSPHORUS: Phosphorus: 3 mg/dL (ref 2.5–4.6)

## 2019-09-04 LAB — LIPASE, BLOOD: Lipase: 87 U/L — ABNORMAL HIGH (ref 11–51)

## 2019-09-04 LAB — MAGNESIUM: Magnesium: 1.3 mg/dL — ABNORMAL LOW (ref 1.7–2.4)

## 2019-09-04 MED ORDER — THIAMINE HCL 100 MG/ML IJ SOLN
100.0000 mg | Freq: Every day | INTRAMUSCULAR | Status: DC
  Administered 2019-09-04: 13:00:00 100 mg via INTRAVENOUS
  Filled 2019-09-04: qty 2

## 2019-09-04 MED ORDER — LORAZEPAM 1 MG PO TABS: 0.0000 mg | ORAL_TABLET | Freq: Two times a day (BID) | ORAL | Status: DC

## 2019-09-04 MED ORDER — METHOCARBAMOL 500 MG PO TABS
500.0000 mg | ORAL_TABLET | Freq: Once | ORAL | Status: AC
  Administered 2019-09-04: 16:00:00 500 mg via ORAL
  Filled 2019-09-04: qty 1

## 2019-09-04 MED ORDER — IOHEXOL 9 MG/ML PO SOLN
500.0000 mL | ORAL | Status: AC
  Administered 2019-09-04 (×2): 500 mL via ORAL

## 2019-09-04 MED ORDER — ACETAMINOPHEN 325 MG PO TABS
650.0000 mg | ORAL_TABLET | Freq: Once | ORAL | Status: AC
  Administered 2019-09-04: 15:00:00 650 mg via ORAL
  Filled 2019-09-04: qty 2

## 2019-09-04 MED ORDER — SODIUM CHLORIDE 0.9 % IV SOLN
1.0000 g | Freq: Once | INTRAVENOUS | Status: AC
  Administered 2019-09-04: 16:00:00 1 g via INTRAVENOUS
  Filled 2019-09-04: qty 10

## 2019-09-04 MED ORDER — LORAZEPAM 2 MG/ML IJ SOLN
1.0000 mg | Freq: Once | INTRAMUSCULAR | Status: AC
  Administered 2019-09-04: 17:00:00 1 mg via INTRAVENOUS
  Filled 2019-09-04: qty 1

## 2019-09-04 MED ORDER — PANTOPRAZOLE SODIUM 40 MG IV SOLR
40.0000 mg | Freq: Once | INTRAVENOUS | Status: AC
  Administered 2019-09-04: 13:00:00 40 mg via INTRAVENOUS
  Filled 2019-09-04: qty 40

## 2019-09-04 MED ORDER — LORAZEPAM 1 MG PO TABS: 0.0000 mg | ORAL_TABLET | Freq: Four times a day (QID) | ORAL | Status: DC

## 2019-09-04 MED ORDER — SODIUM CHLORIDE 0.9 % IV BOLUS
1000.0000 mL | Freq: Once | INTRAVENOUS | Status: AC
  Administered 2019-09-04: 16:00:00 1000 mL via INTRAVENOUS

## 2019-09-04 MED ORDER — SODIUM CHLORIDE 0.9 % IV BOLUS
1000.0000 mL | Freq: Once | INTRAVENOUS | Status: AC
  Administered 2019-09-04: 1000 mL via INTRAVENOUS

## 2019-09-04 MED ORDER — LORAZEPAM 2 MG/ML IJ SOLN: 0.0000 mg | Freq: Two times a day (BID) | INTRAMUSCULAR | Status: DC

## 2019-09-04 MED ORDER — ONDANSETRON HCL 4 MG/2ML IJ SOLN
4.0000 mg | Freq: Once | INTRAMUSCULAR | Status: AC
  Administered 2019-09-04: 13:00:00 4 mg via INTRAVENOUS
  Filled 2019-09-04: qty 2

## 2019-09-04 MED ORDER — IOHEXOL 300 MG/ML  SOLN
100.0000 mL | Freq: Once | INTRAMUSCULAR | Status: AC | PRN
  Administered 2019-09-04: 22:00:00 75 mL via INTRAVENOUS

## 2019-09-04 MED ORDER — SODIUM CHLORIDE 0.9% FLUSH
3.0000 mL | Freq: Once | INTRAVENOUS | Status: AC
  Administered 2019-09-04: 16:00:00 3 mL via INTRAVENOUS

## 2019-09-04 MED ORDER — LORAZEPAM 2 MG/ML IJ SOLN
0.0000 mg | Freq: Four times a day (QID) | INTRAMUSCULAR | Status: DC
  Administered 2019-09-04 (×2): 2 mg via INTRAVENOUS
  Filled 2019-09-04: qty 2
  Filled 2019-09-04: qty 1

## 2019-09-04 MED ORDER — THIAMINE HCL 100 MG PO TABS: 100.0000 mg | ORAL_TABLET | Freq: Every day | ORAL | Status: DC

## 2019-09-04 NOTE — ED Triage Notes (Signed)
Patient reports emesis and diarrhea with cough x 4 days. Patient also reports withdrawal from alcohol. Last drink approx 0100 today.

## 2019-09-04 NOTE — ED Notes (Signed)
Pt reports last drink was vodka at 2 am. Typically drinks 18 cans of beer a day. Reports she had a seizure 2 nights ago and fell. Multiple bruises to right arm and leg. States she had cut back drinking because she was suppose to report to prison today for DUI's

## 2019-09-04 NOTE — ED Provider Notes (Signed)
[facility] EMERGENCY DEPARTMENT Provider Note   CSN: [id number] Arrival date & time: 09/04/19  1144     History Chief Complaint  Patient presents with  . Emesis    Olivia Lester is a 44 y.o. female.  44 year old female presents with complaint of nausea, vomiting, diarrhea with abdominal pain and alcohol withdrawal.  Patient reports typically drinking an 18 pack or half a gallon of liquor daily, last had a beer this morning at 2 AM.  Patient also reports marijuana use, denies other drug use.  Patient states that she did fall a few days ago while drunk, has multiple bruises to her body, states that she did hit the right side of her head and did lose consciousness.  Patient denies neck or back pain.  Emesis is reported to be green and nonbloody, diarrhea is loose and nonbloody.  Abdominal pain located in epigastric area, does not radiate, is constant.  Patient is tremulous, reports hallucinations, relates her symptoms to alcohol withdrawal.  History of similar symptoms in the past, last admitted in DTs November 30.  Also reports a chronic, nonproductive cough which she states is her bronchitis.  No other complaints or concerns today.        Past Medical History:  Diagnosis Date  . Alcohol abuse   . Back injury   . Gastric ulcer   . Gastric ulcer 10/14/2013  . Hepatitis C 10/14/2013  . History of kidney stones   . 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 Pancreatitis, acute   . Panic attack   . Pneumonia   . Polysubstance abuse ([place])   . PONV (postoperative nausea and vomiting)   . Seizures [facility])     Patient Active Problem List   Diagnosis Date Noted  . Alcohol withdrawal ([place]) 06/24/2019  . Sinus tachycardia 06/24/2019  . Essential hypertension 06/24/2019  . Hypokalemia 03/31/2019  . Cannabis abuse 03/11/2019  . Elevated transaminase level   . Alcohol abuse   . Polysubstance (excluding opioids) dependence, daily use ([place]) 09/13/2018  . Closed displaced fracture  of fifth metatarsal bone of right foot 08/02/2018  . Dental abscess 12/25/2017  . Gastric ulcer 10/14/2013  . Transaminitis 10/14/2013  . Tobacco dependence 10/14/2013  . Hepatitis C 10/14/2013  . Thrombocytopenia ([place]) 10/13/2013  . Duodenitis 10/12/2013  . Alcoholism ([place]) 10/12/2013  . Abdominal pain, epigastric 10/12/2013  . ANKLE PAIN 01/01/2008    Past Surgical History:  Procedure Laterality Date  . BACK SURGERY    . BREAST SURGERY    . ESOPHAGOGASTRODUODENOSCOPY (EGD) WITH PROPOFOL N/A 10/14/2013   Procedure: ESOPHAGOGASTRODUODENOSCOPY (EGD) WITH PROPOFOL;  Surgeon: Rogene Houston, MD;  Location: [facility];  Service: Endoscopy;  Laterality: N/A;  . ORIF TOE FRACTURE Right 08/08/2018   Procedure: RIGHT FOOT INTERNAL FIXATION 5TH METATARSAL;  Surgeon: Newt Minion, MD;  Location: [place];  Service: Orthopedics;  Laterality: Right;  . TONSILLECTOMY       OB History    Gravida  7   Para  3   Term  3   Preterm      AB  4   Living  3     SAB  2   TAB  2   Ectopic      Multiple      Live Births  3           Family History  Problem Relation Age of Onset  . Cancer Mother 31  ovarian   . Hypertension Mother   . Stroke Mother   . Early death Father   . Alcohol abuse Father     Social History   Tobacco Use  . Smoking status: Current Every Day Smoker    Packs/day: 1.00    Years: 20.00    Pack years: 20.00    Types: Cigarettes  . Smokeless tobacco: Never Used  Substance Use Topics  . Alcohol use: Yes    Alcohol/week: 12.0 standard drinks    Types: 12 Cans of beer per week    Comment: daily  . Drug use: Yes    Types: Marijuana, Cocaine, Methamphetamines    Comment: denies current cocaine/meth...current mj    Home Medications Prior to Admission medications   Medication Sig Start Date End Date Taking? Authorizing Provider  diphenhydrAMINE (BENADRYL) 25 mg capsule Take 25 mg by mouth daily as needed for allergies.   Yes [provider]  ibuprofen (ADVIL) 200 MG tablet Take 800 mg by mouth 3 (three) times daily as needed for mild pain or moderate pain.   Yes [provider]  Multiple Vitamin (MULTIVITAMIN WITH MINERALS) TABS tablet Take 1 tablet by mouth every other day.    Yes [provider]    Allergies    Toradol [ketorolac tromethamine] and Asa [aspirin]  Review of Systems   Review of Systems  Constitutional: Negative for chills, diaphoresis and fever.  Respiratory: Positive for cough. Negative for shortness of breath.   Cardiovascular: Negative for chest pain.  Gastrointestinal: Positive for abdominal pain, diarrhea, nausea and vomiting. Negative for blood in stool and constipation.  Genitourinary: Negative for dysuria.  Musculoskeletal: Positive for arthralgias, gait problem and myalgias. Negative for neck pain and neck stiffness.  Skin: Negative for wound.  Allergic/Immunologic: Negative for immunocompromised state.  Neurological: Positive for headaches. Negative for weakness.  Hematological: Bruises/bleeds easily.  Psychiatric/Behavioral: Positive for hallucinations. Negative for suicidal ideas. The patient is not nervous/anxious.   All other systems reviewed and are negative.   Physical Exam Updated Vital Signs BP 116/73   Pulse 81   Temp 98.3 F (36.8 C) (Oral)   Resp 19   Ht 5\' 7"  (1.702 m)   Wt 54.4 kg   LMP 06/04/2019   SpO2 98%   BMI 18.79 kg/m   Physical Exam Vitals and nursing note reviewed.  Constitutional:      General: She is not in acute distress.    Appearance: She is well-developed. She is not diaphoretic.  HENT:     Head: Normocephalic and atraumatic.  Cardiovascular:     Rate and Rhythm: Regular rhythm. Tachycardia present.     Pulses: Normal pulses.     Heart sounds: Normal heart sounds.  Pulmonary:     Effort: Pulmonary effort is normal.     Breath sounds: Normal breath sounds.  Abdominal:     Palpations: Abdomen is soft.     Tenderness: There is  abdominal tenderness in the epigastric area. There is no right CVA tenderness or left CVA tenderness.  Skin:    General: Skin is warm and dry.     Findings: Bruising present. No erythema or rash.     Comments: Ecchymosis to right upper arm, left inner thigh, right knee.  No bony tenderness or limited range of motion.  Neurological:     Mental Status: She is alert and oriented to person, place, and time.     Motor: Tremor present.  Psychiatric:  Behavior: Behavior normal.     ED Results / Procedures / Treatments   Labs (all labs ordered are listed, but only abnormal results are displayed) Labs Reviewed  LIPASE, BLOOD - Abnormal; Notable for the following components:      Result Value   Lipase 87 (*)    All other components within normal limits  COMPREHENSIVE METABOLIC PANEL - Abnormal; Notable for the following components:   Potassium 3.4 (*)    Calcium 8.7 (*)    AST 271 (*)    ALT 87 (*)    All other components within normal limits  CBC - Abnormal; Notable for the following components:   RBC 3.64 (*)    MCV 103.0 (*)    MCH 35.4 (*)    Platelets 72 (*)    All other components within normal limits  URINALYSIS, ROUTINE W REFLEX MICROSCOPIC - Abnormal; Notable for the following components:   Color, Urine AMBER (*)    APPearance HAZY (*)    Hgb urine dipstick SMALL (*)    Protein, ur 30 (*)    Nitrite POSITIVE (*)    Leukocytes,Ua SMALL (*)    WBC, UA >50 (*)    Bacteria, UA MANY (*)    All other components within normal limits  ETHANOL - Abnormal; Notable for the following components:   Alcohol, Ethyl (B) 156 (*)    All other components within normal limits  RESPIRATORY PANEL BY RT PCR (FLU A&B, COVID)  URINE CULTURE  POC URINE PREG, ED    EKG None  Radiology CT Head Wo Contrast  Result Date: 09/04/2019 CLINICAL DATA:  Ataxia, head trauma alcohol withdrawl Patient reports seizure 2 nights ago with fall. EXAM: CT HEAD WITHOUT CONTRAST TECHNIQUE: Contiguous  axial images were obtained from the base of the skull through the vertex without intravenous contrast. COMPARISON:  Head CT 09/25/2013 FINDINGS: Brain: No intracranial hemorrhage, mass effect, or midline shift. No hydrocephalus. The basilar cisterns are patent. No evidence of territorial infarct or acute ischemia. No evidence of pontine hypodensity. No extra-axial or intracranial fluid collection. Vascular: No hyperdense vessel or unexpected calcification. Skull: No fracture or focal lesion. Sinuses/Orbits: Paranasal sinuses and mastoid air cells are clear. The visualized orbits are unremarkable. Other: None. IMPRESSION: Unremarkable noncontrast head CT. Electronically Signed   By: Keith Rake M.D.   On: 09/04/2019 13:53   DG Chest Port 1 View  Result Date: 09/04/2019 CLINICAL DATA:  Cough.  Vomiting and diarrhea. EXAM: PORTABLE CHEST 1 VIEW COMPARISON:  06/24/2019 FINDINGS: The heart size and mediastinal contours are within normal limits. Both lungs are clear. The visualized skeletal structures are unremarkable. IMPRESSION: Normal exam. Electronically Signed   By: Lorriane Shire M.D.   On: 09/04/2019 12:59    Procedures Procedures (including critical care time)  Medications Ordered in ED Medications  sodium chloride flush (NS) 0.9 % injection 3 mL (has no administration in time range)  LORazepam (ATIVAN) injection 0-4 mg (2 mg Intravenous Given 09/04/19 1256)    Or  LORazepam (ATIVAN) tablet 0-4 mg ( Oral See Alternative 09/04/19 1256)  LORazepam (ATIVAN) injection 0-4 mg (has no administration in time range)    Or  LORazepam (ATIVAN) tablet 0-4 mg (has no administration in time range)  thiamine tablet 100 mg ( Oral See Alternative 09/04/19 1301)    Or  thiamine (B-1) injection 100 mg (100 mg Intravenous Given 09/04/19 1301)  cefTRIAXone (ROCEPHIN) 1 g in sodium chloride 0.9 % 100 mL IVPB (has no administration  in time range)  methocarbamol (ROBAXIN) tablet 500 mg (has no administration in  time range)  sodium chloride 0.9 % bolus 1,000 mL (0 mLs Intravenous Stopped 09/04/19 1438)  pantoprazole (PROTONIX) injection 40 mg (40 mg Intravenous Given 09/04/19 1259)  ondansetron (ZOFRAN) injection 4 mg (4 mg Intravenous Given 09/04/19 1300)  acetaminophen (TYLENOL) tablet 650 mg (650 mg Oral Given 09/04/19 1435)    ED Course  I have reviewed the triage vital signs and the nursing notes.  Pertinent labs & imaging results that were available during my care of the patient were reviewed by me and considered in my medical decision making (see chart for details).  Clinical Course as of Sep 04 1535  Wed Feb 10, 173  3462 44 year old female with history of heavy alcohol abuse and prior DTs presents with complaint of shaking, vomiting, diarrhea, hallucinations.  Patient says that she had a fall a few days ago resulting in bruising to the right side of her head, right arm and knee.  Unsure if this fall was related to a possible seizure. On exam patient is tremulous, not diaphoretic, bruising to right side of head, right arm, right knee, left groin without bony tenderness or crepitus.  CT head negative for acute injury. Lab work shows elevated LFTs specifically AST 271, ALT 87.  Patient's lipase is 87.  CBC with platelets of 72,000.  Urinalysis concerning for UTI with nitrites, leukocytes, many bacteria and white cells, will add culture and start Rocephin. Patient is no longer vomiting, reports ongoing abdominal pain, was given Ativan per CIWA protocol, also given Protonix, IV fluids, thiamine and multivitamin. Plan is to admit to hospitalist service for further monitoring.   [LM]  1537 Case discussed with Dr. Denton Brick who will consult for admission.    [LM]    Clinical Course User Index [LM] Roque Lias   MDM Rules/Calculators/A&P                      Final Clinical Impression(s) / ED Diagnoses Final diagnoses:  Nausea vomiting and diarrhea  DTs (delirium tremens) ([place])  Elevated  lipase  Urinary tract infection without hematuria, site unspecified    Rx / DC Orders ED Discharge Orders    None       Tacy Learn, PA-C 09/04/19 1538    Lajean Saver, MD 09/06/19 1241

## 2019-09-04 NOTE — H&P (Signed)
History and Physical    Olivia Lester [id number] DOB: 1976-01-28 DOA: 09/04/2019  PCP: Patient, No Pcp Per   Patient coming from: Home  I have personally briefly reviewed patient's old medical records in [place]  Chief Complaint: Vomiting, diarrhea, cough  HPI: Olivia Lester is a 44 y.o. female with medical history significant for substance abuse-tobacco, marijuana, alcohol intoxication, hypertension, tobacco abuse, hepatitis C.  Patient presented to the ED with complaints of vomiting and loose stools over the past 4 days.  She reports 3-4 episodes of vomiting daily, 2 bowel movements daily.  She reports upper mid abdominal pain.  Also reports dysuria over the past few days.  Reports use of ibuprofen for pain. She also reports a cough present for a month at least, productive of greenish phlegm.  She denies difficulty breathing.  No chest pain.  She reports a fall a few days ago, she admits she fell because she was intoxicated.  She hit her head and reports loss of consciousness.  Per intake note patient reported hallucinations, which she denies to me at this time, but she endorses tremulousness and thinks she is going into alcohol withdrawal. Patients last drink of alcohol was at about 2 AM early this morning.  She typically drinks 18 pack or half a gallon of liquor daily.  Patient tells me she is ready to quit drinking alcohol. [facility].  Similar admission 11/30-12/1 for alcohol withdrawal, patient left AGAINST MEDICAL ADVICE.  History of leaving AMA on multiple prior hospitalizations.  ED Course: Heart rate 70s to 90s, temperature 98.3.  Blood pressure 1 1 6-1 60s.  Chronic elevation in liver enzymes.  Blood alcohol level 156.  Head CT without acute abnormality.  WBC 5.8.  Respiratory virus panel negative for Covid 19 and influenza.  Blood alcohol level 156.  Lipase mildly elevated at 87.  UTI suggestive of UTI.  IV ceftriaxone started.   Patient was tremulous in the ED, with intermittent tachycardia, elevated blood pressure, with concern for alcohol withdrawal, vomiting and diarrhea hospitalist admit for further evaluation and management.  Review of Systems: As per HPI all other systems reviewed and negative.  Past Medical History:  Diagnosis Date  . Alcohol abuse   . Back injury   . Gastric ulcer   . Gastric ulcer 10/14/2013  . Hepatitis C 10/14/2013  . History of kidney stones   . 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 Pancreatitis, acute   . Panic attack   . Pneumonia   . Polysubstance abuse ([place])   . PONV (postoperative nausea and vomiting)   . Seizures ([place])     Past Surgical History:  Procedure Laterality Date  . BACK SURGERY    . BREAST SURGERY    . ESOPHAGOGASTRODUODENOSCOPY (EGD) WITH PROPOFOL N/A 10/14/2013   Procedure: ESOPHAGOGASTRODUODENOSCOPY (EGD) WITH PROPOFOL;  Surgeon: Rogene Houston, MD;  Location[facility];  Service: Endoscopy;  Laterality: N/A;  . ORIF TOE FRACTURE Right 08/08/2018   Procedure: RIGHT FOOT INTERNAL FIXATION 5TH METATARSAL;  Surgeon: Newt Minion, MD;  Location: [place];  Service: Orthopedics;  Laterality: Right;  . TONSILLECTOMY       reports that she has been smoking cigarettes. She has a 20.00 pack-year smoking history. She has never used smokeless tobacco. She reports current alcohol use of about 12.0 standard drinks of alcohol per week. She reports current drug use. Drugs: Marijuana, Cocaine, and Methamphetamines.  Allergies  Allergen Reactions  . Toradol [Ketorolac Tromethamine] Other (See Comments)    Patient advised not to take due to ulcers  . Asa [Aspirin] Rash    Family History  Problem Relation Age of Onset  . Cancer Mother 33       ovarian   . Hypertension Mother   . Stroke Mother   . Early death Father   . Alcohol abuse Father     Prior to Admission medications   Medication Sig Start Date End Date Taking? Authorizing Provider    diphenhydrAMINE (BENADRYL) 25 mg capsule Take 25 mg by mouth daily as needed for allergies.   Yes [provider]  ibuprofen (ADVIL) 200 MG tablet Take 800 mg by mouth 3 (three) times daily as needed for mild pain or moderate pain.   Yes [provider]  Multiple Vitamin (MULTIVITAMIN WITH MINERALS) TABS tablet Take 1 tablet by mouth every other day.    Yes [provider]    Physical Exam: Vitals:   09/04/19 1201 09/04/19 1330 09/04/19 1430 09/04/19 1500  BP:  (!) 136/94 (!) 137/94 116/73  Pulse:  75 81   Resp:  20 19   Temp:      TempSrc:      SpO2:  95% 98%   Weight: 54.4 kg     Height: 5\' 7"  (1.702 m)       Constitutional: NAD, calm, comfortable Vitals:   09/04/19 1201 09/04/19 1330 09/04/19 1430 09/04/19 1500  BP:  (!) 136/94 (!) 137/94 116/73  Pulse:  75 81   Resp:  20 19   Temp:      TempSrc:      SpO2:  95% 98%   Weight: 54.4 kg     Height: 5\' 7"  (1.702 m)      Eyes: PERRL, lids and conjunctivae normal ENMT: Mucous membranes are moist. Neck: normal, supple, no masses, no thyromegaly Respiratory:  Normal respiratory effort. No accessory muscle use.  Cardiovascular: Regular rate and rhythm,No extremity edema. 2+ pedal pulses.  Abdomen: Mild epigastric tenderness, no masses palpated. No hepatosplenomegaly. Bowel sounds positive.  Musculoskeletal: tremulous, no clubbing / cyanosis. No joint deformity upper and lower extremities. Good ROM, no contractures. Normal muscle tone.  Skin: no rashes, lesions, ulcers. No induration Neurologic: No gross cranial nerve abnormality, moving all extremities spontaneously. Psychiatric: Normal judgment and insight. Alert and oriented x 3. Normal mood.   Labs on Admission: I have personally reviewed following labs and imaging studies  CBC: Recent Labs  Lab 09/04/19 1229  WBC 5.8  HGB 12.9  HCT 37.5  MCV 103.0*  PLT 72*   Basic Metabolic Panel: Recent Labs  Lab 09/04/19 1229  NA 140  K 3.4*  CL  100  CO2 25  GLUCOSE 79  BUN 8  CREATININE 0.45  CALCIUM 8.7*   Liver Function Tests: Recent Labs  Lab 09/04/19 1229  AST 271*  ALT 87*  ALKPHOS 73  BILITOT 0.7  PROT 7.6  ALBUMIN 4.5   Recent Labs  Lab 09/04/19 1229  LIPASE 87*   Urine analysis:    Component Value Date/Time   COLORURINE AMBER (A) 09/04/2019 1204   APPEARANCEUR HAZY (A) 09/04/2019 1204   APPEARANCEUR Clear 12/07/2013 0133   LABSPEC 1.020 09/04/2019 1204   LABSPEC 1.006 12/07/2013 0133   PHURINE 6.0 09/04/2019 1204   GLUCOSEU NEGATIVE 09/04/2019 1204   GLUCOSEU Negative 12/07/2013 0133   HGBUR SMALL (A) 09/04/2019 [place] 09/04/2019 1204  BILIRUBINUR Negative 12/07/2013 0133   KETONESUR NEGATIVE 09/04/2019 1204   PROTEINUR 30 (A) 09/04/2019 1204   UROBILINOGEN 0.2 04/28/2015 1559   NITRITE POSITIVE (A) 09/04/2019 1204   LEUKOCYTESUR SMALL (A) 09/04/2019 1204   LEUKOCYTESUR Negative 12/07/2013 0133    Radiological Exams on Admission: CT Head Wo Contrast  Result Date: 09/04/2019 CLINICAL DATA:  Ataxia, head trauma alcohol withdrawl Patient reports seizure 2 nights ago with fall. EXAM: CT HEAD WITHOUT CONTRAST TECHNIQUE: Contiguous axial images were obtained from the base of the skull through the vertex without intravenous contrast. COMPARISON:  Head CT 09/25/2013 FINDINGS: Brain: No intracranial hemorrhage, mass effect, or midline shift. No hydrocephalus. The basilar cisterns are patent. No evidence of territorial infarct or acute ischemia. No evidence of pontine hypodensity. No extra-axial or intracranial fluid collection. Vascular: No hyperdense vessel or unexpected calcification. Skull: No fracture or focal lesion. Sinuses/Orbits: Paranasal sinuses and mastoid air cells are clear. The visualized orbits are unremarkable. Other: None. IMPRESSION: Unremarkable noncontrast head CT. Electronically Signed   By: Keith Rake M.D.   On: 09/04/2019 13:53   DG Chest Port 1  View  Result Date: 09/04/2019 CLINICAL DATA:  Cough.  Vomiting and diarrhea. EXAM: PORTABLE CHEST 1 VIEW COMPARISON:  06/24/2019 FINDINGS: The heart size and mediastinal contours are within normal limits. Both lungs are clear. The visualized skeletal structures are unremarkable. IMPRESSION: Normal exam. Electronically Signed   By: Lorriane Shire M.D.   On: 09/04/2019 12:59    EKG: Independently reviewed.  QTc 484.  No significant ST-T wave changes compared to prior.  Assessment/Plan Active Problems:   Alcohol intoxication ([place])    Alcohol intoxication and withdrawal- blood alcohol level 156, showing early signs of withdrawal with tremulousness.  Reported hallucinations.  Last alcoholic beverage ~2 a.m. this morning. She typically drinks 18 pack or half a gallon of liquor daily.  Reports she is ready to stop drinking alcohol. She is supposed to present at the [facility] [facility] today for DUI. -High risk for withdrawal, will start Librium 25 mg every 6 hourly x 1 day, prophylactically - CIWA scheduled and PRN -Thiamine, folate, multivitamins -Check magnesium, phosphorus  UTI-dysuria.  WBC 5.8.  Rules out for sepsis.  UA suggestive of UTI with positive nitrite small leukocytes, many bacteria.  Urine cultures from 04/2019-pansensitive E. coli. -Follow-up urine cultures -Continue IV ceftriaxone 1 g daily  Abdominal pain with vomiting and diarrhea-differentials alcoholic gastritis versus peptic ulcer disease.  She reports NSAID use -ibuprofen 3 times daily most days.  Chronic transaminitis, AST-271, ALT 87, likely alcoholic liver disease.  Hgb stable at 12.9. -Obtain abdominal pelvic CT with contrast -Clear liquid diet, advance as tolerated - D 5 N/s + 40 Kcl 100 cc/hr x 15 hrs -CBC, BMP a.m. -Stool C. difficile -IV Protonix 40 every 12 hourly  Hypokalemia-potassium 3.4 - Check mag -Replete  Substance abuse-tobacco, marijuana, alcohol. -Obtain UDS  Falls-likely from intoxication.  Head CT negative  for acute abnormality.  Chronic cough- > 1 month.  Chest x-ray without acute abnormality.  WBC 5.8.  Not hypoxic, no dyspnea, no chest pain.  Tobacco abuse history.. ?? Bronchitis.  Respiratory virus panel negative for influenza and COVID-19. -Follow-up as outpatient.  DVT prophylaxis: SCDs-in case of possible peptic ulcer disease. Code Status: Full code Family Communication: None at bedside Disposition Plan: 1 to 2 days pending urine cultures and resolution of alcohol withdrawal physiology. Consults called: None Admission status:  Obs, Tele    Bethena Roys MD [facility]  09/04/2019,  5:36 PM

## 2019-09-05 DIAGNOSIS — E876 Hypokalemia: Secondary | ICD-10-CM

## 2019-09-05 DIAGNOSIS — N3000 Acute cystitis without hematuria: Secondary | ICD-10-CM

## 2019-09-05 LAB — CBC
HCT: 40 % (ref 36.0–46.0)
Hemoglobin: 13.7 g/dL (ref 12.0–15.0)
MCH: 35 pg — ABNORMAL HIGH (ref 26.0–34.0)
MCHC: 34.3 g/dL (ref 30.0–36.0)
MCV: 102.3 fL — ABNORMAL HIGH (ref 80.0–100.0)
Platelets: 53 10*3/uL — ABNORMAL LOW (ref 150–400)
RBC: 3.91 MIL/uL (ref 3.87–5.11)
RDW: 13.9 % (ref 11.5–15.5)
WBC: 3.9 10*3/uL — ABNORMAL LOW (ref 4.0–10.5)
nRBC: 0 % (ref 0.0–0.2)

## 2019-09-05 LAB — GLUCOSE, CAPILLARY
Glucose-Capillary: 91 mg/dL (ref 70–99)
Glucose-Capillary: 83 mg/dL (ref 70–99)
Glucose-Capillary: 127 mg/dL — ABNORMAL HIGH (ref 70–99)

## 2019-09-05 LAB — COMPREHENSIVE METABOLIC PANEL
ALT: 77 U/L — ABNORMAL HIGH (ref 0–44)
AST: 204 U/L — ABNORMAL HIGH (ref 15–41)
Albumin: 4.1 g/dL (ref 3.5–5.0)
Alkaline Phosphatase: 68 U/L (ref 38–126)
Anion gap: 14 (ref 5–15)
BUN: 6 mg/dL (ref 6–20)
CO2: 27 mmol/L (ref 22–32)
Calcium: 8.1 mg/dL — ABNORMAL LOW (ref 8.9–10.3)
Chloride: 93 mmol/L — ABNORMAL LOW (ref 98–111)
Creatinine, Ser: 0.45 mg/dL (ref 0.44–1.00)
GFR calc Af Amer: >60 mL/min (ref >60)
GFR calc non Af Amer: >60 mL/min (ref >60)
Glucose, Bld: 69 mg/dL — ABNORMAL LOW (ref 70–99)
Potassium: 3.1 mmol/L — ABNORMAL LOW (ref 3.5–5.1)
Sodium: 134 mmol/L — ABNORMAL LOW (ref 135–145)
Total Bilirubin: 1.3 mg/dL — ABNORMAL HIGH (ref 0.3–1.2)
Total Protein: 7.5 g/dL (ref 6.5–8.1)

## 2019-09-05 MED ORDER — LORAZEPAM 2 MG/ML IJ SOLN
1.0000 mg | INTRAMUSCULAR | Status: DC | PRN
  Administered 2019-09-06 (×2): 2 mg via INTRAVENOUS
  Administered 2019-09-06: 3 mg via INTRAVENOUS
  Administered 2019-09-06 – 2019-09-07 (×3): 2 mg via INTRAVENOUS
  Administered 2019-09-07: 07:00:00 1 mg via INTRAVENOUS
  Administered 2019-09-07: 2 mg via INTRAVENOUS
  Filled 2019-09-05 (×8): qty 1

## 2019-09-05 MED ORDER — ONDANSETRON HCL 4 MG/2ML IJ SOLN: 4.0000 mg | Freq: Four times a day (QID) | INTRAMUSCULAR | Status: DC | PRN

## 2019-09-05 MED ORDER — ONDANSETRON HCL 4 MG PO TABS: 4.0000 mg | ORAL_TABLET | Freq: Four times a day (QID) | ORAL | Status: DC | PRN

## 2019-09-05 MED ORDER — CHLORDIAZEPOXIDE HCL 25 MG PO CAPS
25.0000 mg | ORAL_CAPSULE | Freq: Three times a day (TID) | ORAL | Status: DC
  Administered 2019-09-05 – 2019-09-06 (×3): 25 mg via ORAL
  Filled 2019-09-05 (×3): qty 1

## 2019-09-05 MED ORDER — ACETAMINOPHEN 650 MG RE SUPP: 650.0000 mg | Freq: Four times a day (QID) | RECTAL | Status: DC | PRN

## 2019-09-05 MED ORDER — FOLIC ACID 1 MG PO TABS
1.0000 mg | ORAL_TABLET | Freq: Every day | ORAL | Status: DC
  Administered 2019-09-06 – 2019-09-07 (×2): 1 mg via ORAL
  Filled 2019-09-05 (×3): qty 1

## 2019-09-05 MED ORDER — POTASSIUM CHLORIDE 10 MEQ/100ML IV SOLN
10.0000 meq | INTRAVENOUS | Status: AC
  Administered 2019-09-05 (×4): 10 meq via INTRAVENOUS
  Filled 2019-09-05 (×4): qty 100

## 2019-09-05 MED ORDER — THIAMINE HCL 100 MG PO TABS
100.0000 mg | ORAL_TABLET | Freq: Every day | ORAL | Status: DC
  Administered 2019-09-06 – 2019-09-07 (×2): 100 mg via ORAL
  Filled 2019-09-05 (×3): qty 1

## 2019-09-05 MED ORDER — DEXTROSE-NACL 5-0.9 % IV SOLN: INTRAVENOUS | Status: DC

## 2019-09-05 MED ORDER — PANTOPRAZOLE SODIUM 40 MG IV SOLR
40.0000 mg | Freq: Two times a day (BID) | INTRAVENOUS | Status: DC
  Administered 2019-09-05 – 2019-09-07 (×5): 40 mg via INTRAVENOUS
  Filled 2019-09-05 (×5): qty 40

## 2019-09-05 MED ORDER — POLYETHYLENE GLYCOL 3350 17 G PO PACK: 17.0000 g | PACK | Freq: Every day | ORAL | Status: DC | PRN

## 2019-09-05 MED ORDER — ACETAMINOPHEN 325 MG PO TABS
650.0000 mg | ORAL_TABLET | Freq: Four times a day (QID) | ORAL | Status: DC | PRN
  Administered 2019-09-05 – 2019-09-07 (×3): 650 mg via ORAL
  Filled 2019-09-05 (×4): qty 2

## 2019-09-05 MED ORDER — POTASSIUM CHLORIDE CRYS ER 20 MEQ PO TBCR
40.0000 meq | EXTENDED_RELEASE_TABLET | Freq: Once | ORAL | Status: AC
  Administered 2019-09-05: 11:00:00 40 meq via ORAL
  Filled 2019-09-05: qty 2

## 2019-09-05 MED ORDER — LORAZEPAM 2 MG/ML IJ SOLN
0.0000 mg | Freq: Four times a day (QID) | INTRAMUSCULAR | Status: AC
  Administered 2019-09-05: 05:00:00 2 mg via INTRAVENOUS
  Administered 2019-09-05 – 2019-09-06 (×4): 1 mg via INTRAVENOUS
  Administered 2019-09-06 (×2): 2 mg via INTRAVENOUS
  Administered 2019-09-07: 1 mg via INTRAVENOUS
  Filled 2019-09-05 (×8): qty 1

## 2019-09-05 MED ORDER — MAGNESIUM SULFATE 2 GM/50ML IV SOLN
2.0000 g | Freq: Once | INTRAVENOUS | Status: AC
  Administered 2019-09-05: 2 g via INTRAVENOUS
  Filled 2019-09-05: qty 50

## 2019-09-05 MED ORDER — THIAMINE HCL 100 MG/ML IJ SOLN: 100.0000 mg | Freq: Every day | INTRAMUSCULAR | Status: DC

## 2019-09-05 MED ORDER — LORAZEPAM 1 MG PO TABS: 1.0000 mg | ORAL_TABLET | ORAL | Status: DC | PRN

## 2019-09-05 MED ORDER — KCL IN DEXTROSE-NACL 40-5-0.9 MEQ/L-%-% IV SOLN: INTRAVENOUS | Status: DC

## 2019-09-05 MED ORDER — LORAZEPAM 2 MG/ML IJ SOLN
0.0000 mg | Freq: Two times a day (BID) | INTRAMUSCULAR | Status: DC
  Filled 2019-09-05 (×2): qty 1

## 2019-09-05 MED ORDER — ADULT MULTIVITAMIN W/MINERALS CH
1.0000 | ORAL_TABLET | Freq: Every day | ORAL | Status: DC
  Administered 2019-09-06 – 2019-09-07 (×2): 1 via ORAL
  Filled 2019-09-05 (×3): qty 1

## 2019-09-05 MED ORDER — SODIUM CHLORIDE 0.9 % IV SOLN
1.0000 g | INTRAVENOUS | Status: DC
  Administered 2019-09-05 – 2019-09-06 (×2): 1 g via INTRAVENOUS
  Filled 2019-09-05 (×2): qty 10

## 2019-09-05 MED ORDER — CHLORDIAZEPOXIDE HCL 25 MG PO CAPS
25.0000 mg | ORAL_CAPSULE | Freq: Four times a day (QID) | ORAL | Status: DC
  Administered 2019-09-05: 25 mg via ORAL
  Filled 2019-09-05: qty 1

## 2019-09-05 NOTE — Progress Notes (Signed)
Consult request has been received. CSW attempting to follow up at present time  Meredyth Hornung M. Kaira Stringfield LCSWA Transitions of Care  Clinical Social Worker  Ph: [phone] 

## 2019-09-05 NOTE — Progress Notes (Signed)
CSW attempted to contact patient on room phone to address consult and to offer education and referral of services for her alcohol abuse.  CSW was unsuccessful. CSW will attempt to address consult at bedside.   [place] Transitions of Care  Clinical Social Worker  Ph: [phone]

## 2019-09-05 NOTE — Progress Notes (Signed)
PROGRESS NOTE    Olivia Lester  [id number] DOB: Dec 15, 1975 DOA: 09/04/2019 PCP: Patient, No Pcp Per   Brief Narrative:  44 year old with history of substance abuse, tobacco use, marijuana, alcohol abuse, HTN, tobacco use, hepatitis C came to the hospital complains of intermittent diarrhea, epigastric abdominal pain and alcohol intoxication causing falls.  She was admitted to the hospital with concerns of diarrhea, gastritis, alcohol intoxication with concerns for acute withdrawal.   Assessment & Plan:   Active Problems:   Alcohol intoxication ([place])  Alcohol intoxication Alcohol abuse Polysubstance use -Currently on alcohol withdrawal protocol.  At very high risk of withdrawal given her alcohol abuse history -Change IV fluids to normal saline at 125 cc an hour -Librium every 8 hours.  Ativan as needed per CIWA -B12 and folate supplements -Counseled to quit using alcohol. -Closely monitor electrolytes  Acute cystitis without hematuria -Follow-up culture data.  Empirically on IV Rocephin  Epigastric discomfort -Suspect this is secondary to gastritis. -CT abdomen pelvis is negative -Lipase 86. -LFTs elevated-alcohol hepatitis pattern. -PPI IV every 12  Hypokalemia-aggressive repletion  Chronic cough-chest x-ray negative, COVID-19-negative.  No other smokers cough versus gastritis/reflux.  Advised to stop using alcohol and tobacco.  Patient is at risk of leaving AGAINST MEDICAL ADVICE  DVT prophylaxis: SCDs Code Status: Full code Family Communication: None Disposition Plan:   Patient From= home  Patient Anticipated D/C place= Home  Barriers= anticipate discharge home once she is out of the alcohol withdrawal phase.  Poor oral tolerance due to epigastric discomfort.   Subjective: Reports of epigastric discomfort causing poor oral intake.  Not an active withdrawal at this time.  Tells me she drinks about 18 cans of beers daily and half a gallon of liquor.  I explained her  to quit using any alcohol.  The minute I was done speaking with her, she immediately asked for her next Ativan dose.  I explained her that she was not having any active signs of withdrawal therefore it was not necessary.  Review of Systems Otherwise negative except as per HPI, including: General: Denies fever, chills, night sweats or unintended weight loss. Resp: Denies cough, wheezing, shortness of breath. Cardiac: Denies chest pain, palpitations, orthopnea, paroxysmal nocturnal dyspnea. GI: Denies abdominal pain, nausea, vomiting, diarrhea or constipation GU: Denies dysuria, frequency, hesitancy or incontinence MS: Denies muscle aches, joint pain or swelling Neuro: Denies headache, neurologic deficits (focal weakness, numbness, tingling), abnormal gait Psych: Denies anxiety, depression, SI/HI/AVH Skin: Denies new rashes or lesions ID: Denies sick contacts, exotic exposures, travel  Examination:  General exam: Appears calm and comfortable  Respiratory system: Clear to auscultation. Respiratory effort normal. Cardiovascular system: S1 & S2 heard, RRR. No JVD, murmurs, rubs, gallops or clicks. No pedal edema. Gastrointestinal system: Abdomen is nondistended, soft and nontender. No organomegaly or masses felt. Normal bowel sounds heard. Central nervous system: Alert and oriented. No focal neurological deficits. Extremities: Symmetric 5 x 5 power. Skin: No rashes, lesions or ulcers Psychiatry: Judgement and insight appear normal. Mood & affect appropriate.     Objective: Vitals:   09/05/19 0603 09/05/19 1030 09/05/19 1100 09/05/19 1210  BP: (!) 142/110 (!) 150/108 (!) 150/108   Pulse: 94 96 96 90  Resp: 18 18    Temp: 98.4 F (36.9 C) 98.9 F (37.2 C)    TempSrc: Oral     SpO2: 100% 99%    Weight:      Height:        Intake/Output Summary (Last 24 hours) at  09/05/2019 1246 Last data filed at 09/05/2019 0900 Gross per 24 hour  Intake 348.28 ml  Output --  Net 348.28 ml    Filed Weights   09/04/19 1201 09/04/19 1829  Weight: 54.4 kg 59.6 kg     Data Reviewed:   CBC: Recent Labs  Lab 09/04/19 1229 09/05/19 0506  WBC 5.8 3.9*  HGB 12.9 13.7  HCT 37.5 40.0  MCV 103.0* 102.3*  PLT 72* 53*   Basic Metabolic Panel: Recent Labs  Lab 09/04/19 1229 09/05/19 0506  NA 140 134*  K 3.4* 3.1*  CL 100 93*  CO2 25 27  GLUCOSE 79 69*  BUN 8 6  CREATININE 0.45 0.45  CALCIUM 8.7* 8.1*  MG 1.3*  --   PHOS 3.0  --    GFR: Estimated Creatinine Clearance: 85.3 mL/min (by C-G formula based on SCr of 0.45 mg/dL). Liver Function Tests: Recent Labs  Lab 09/04/19 1229 09/05/19 0506  AST 271* 204*  ALT 87* 77*  ALKPHOS 73 68  BILITOT 0.7 1.3*  PROT 7.6 7.5  ALBUMIN 4.5 4.1   Recent Labs  Lab 09/04/19 1229  LIPASE 87*   No results for input(s): AMMONIA in the last 168 hours. Coagulation Profile: No results for input(s): INR, PROTIME in the last 168 hours. Cardiac Enzymes: No results for input(s): CKTOTAL, CKMB, CKMBINDEX, TROPONINI in the last 168 hours. BNP (last 3 results) No results for input(s): PROBNP in the last 8760 hours. HbA1C: No results for input(s): HGBA1C in the last 72 hours. CBG: Recent Labs  Lab 09/05/19 0743 09/05/19 1111  GLUCAP 91 83   Lipid Profile: No results for input(s): CHOL, HDL, LDLCALC, TRIG, CHOLHDL, LDLDIRECT in the last 72 hours. Thyroid Function Tests: No results for input(s): TSH, T4TOTAL, FREET4, T3FREE, THYROIDAB in the last 72 hours. Anemia Panel: No results for input(s): VITAMINB12, FOLATE, FERRITIN, TIBC, IRON, RETICCTPCT in the last 72 hours. Sepsis Labs: No results for input(s): PROCALCITON, LATICACIDVEN in the last 168 hours.  Recent Results (from the past 240 hour(s))  Respiratory Panel by RT PCR (Flu A&B, Covid) - Nasopharyngeal Swab     Status: None   Collection Time: 09/04/19  2:54 PM   Specimen: Nasopharyngeal Swab  Result Value Ref Range Status   SARS Coronavirus 2 by RT PCR  NEGATIVE NEGATIVE Final    Comment: (NOTE) SARS-CoV-2 target nucleic acids are NOT DETECTED. The SARS-CoV-2 RNA is generally detectable in upper respiratoy specimens during the acute phase of infection. The lowest concentration of SARS-CoV-2 viral copies this assay can detect is 131 copies/mL. A negative result does not preclude SARS-Cov-2 infection and should not be used as the sole basis for treatment or other patient management decisions. A negative result may occur with  improper specimen collection/handling, submission of specimen other than nasopharyngeal swab, presence of viral mutation(s) within the areas targeted by this assay, and inadequate number of viral copies (<131 copies/mL). A negative result must be combined with clinical observations, patient history, and epidemiological information. The expected result is Negative. Fact Sheet for Patients:  PinkCheek.be Fact Sheet for Healthcare Providers:  GravelBags.it This test is not yet ap proved or cleared by the [place] FDA and  has been authorized for detection and/or diagnosis of SARS-CoV-2 by FDA under an Emergency Use Authorization (EUA). This EUA will remain  in effect (meaning this test can be used) for the duration of the COVID-19 declaration under Section 564(b)(1) of the Act, 21 U.S.C. section 360bbb-3(b)(1), unless the authorization is terminated  or revoked sooner.    Influenza A by PCR NEGATIVE NEGATIVE Final   Influenza B by PCR NEGATIVE NEGATIVE Final    Comment: (NOTE) The Xpert Xpress SARS-CoV-2/FLU/RSV assay is intended as an aid in  the diagnosis of influenza from Nasopharyngeal swab specimens and  should not be used as a sole basis for treatment. Nasal washings and  aspirates are unacceptable for Xpert Xpress SARS-CoV-2/FLU/RSV  testing. Fact Sheet for Patients: PinkCheek.be Fact Sheet for Healthcare  Providers: GravelBags.it This test is not yet approved or cleared by the [place] FDA and  has been authorized for detection and/or diagnosis of SARS-CoV-2 by  FDA under an Emergency Use Authorization (EUA). This EUA will remain  in effect (meaning this test can be used) for the duration of the  Covid-19 declaration under Section 564(b)(1) of the Act, 21  U.S.C. section 360bbb-3(b)(1), unless the authorization is  terminated or revoked. Performed at [facility], [address], [place], [place] [address]          Radiology Studies: CT Head Wo Contrast  Result Date: 09/04/2019 CLINICAL DATA:  Ataxia, head trauma alcohol withdrawl Patient reports seizure 2 nights ago with fall. EXAM: CT HEAD WITHOUT CONTRAST TECHNIQUE: Contiguous axial images were obtained from the base of the skull through the vertex without intravenous contrast. COMPARISON:  Head CT 09/25/2013 FINDINGS: Brain: No intracranial hemorrhage, mass effect, or midline shift. No hydrocephalus. The basilar cisterns are patent. No evidence of territorial infarct or acute ischemia. No evidence of pontine hypodensity. No extra-axial or intracranial fluid collection. Vascular: No hyperdense vessel or unexpected calcification. Skull: No fracture or focal lesion. Sinuses/Orbits: Paranasal sinuses and mastoid air cells are clear. The visualized orbits are unremarkable. Other: None. IMPRESSION: Unremarkable noncontrast head CT. Electronically Signed   By: Keith Rake M.D.   On: 09/04/2019 13:53   CT ABDOMEN PELVIS W CONTRAST  Result Date: 09/04/2019 CLINICAL DATA:  Nausea, vomiting EXAM: CT ABDOMEN AND PELVIS WITH CONTRAST TECHNIQUE: Multidetector CT imaging of the abdomen and pelvis was performed using the standard protocol following bolus administration of intravenous contrast. CONTRAST:  53mL OMNIPAQUE IOHEXOL 300 MG/ML  SOLN COMPARISON:  03/31/2019 FINDINGS: Lower chest: Lung bases are clear. No  effusions. Heart is normal size. Hepatobiliary: Severe diffuse fatty infiltration of the liver. No focal abnormality. Gallbladder unremarkable. Common bowel duct prominent at 8 mm. No visible ductal stone or mass. This is stable since prior study. Pancreas: No focal abnormality or ductal dilatation. No surrounding inflammation currently. Spleen: No focal abnormality.  Normal size. Adrenals/Urinary Tract: No adrenal abnormality. No focal renal abnormality. No stones or hydronephrosis. Urinary bladder is unremarkable. Stomach/Bowel: Normal appendix. Stomach, large and small bowel grossly unremarkable. Vascular/Lymphatic: Aortic atherosclerosis. No enlarged abdominal or pelvic lymph nodes. Reproductive: Uterus and left adnexar unremarkable. 2.1 cm fat containing mass in the right ovary compatible with dermoid. Other: No free fluid or free air. Musculoskeletal: Postoperative changes in the lumbar spine. No acute bony abnormality. IMPRESSION: Severe diffuse fatty infiltration of the liver. Stable common bile duct prominence, 8 mm. Stable right ovarian dermoid. Aortic atherosclerosis. No acute findings. Electronically Signed   By: Rolm Baptise M.D.   On: 09/04/2019 22:26   DG Chest Port 1 View  Result Date: 09/04/2019 CLINICAL DATA:  Cough.  Vomiting and diarrhea. EXAM: PORTABLE CHEST 1 VIEW COMPARISON:  06/24/2019 FINDINGS: The heart size and mediastinal contours are within normal limits. Both lungs are clear. The visualized skeletal structures are unremarkable. IMPRESSION: Normal exam. Electronically Signed  By: Lorriane Shire M.D.   On: 09/04/2019 12:59        Scheduled Meds: . chlordiazePOXIDE  25 mg Oral [id number]  . folic acid  1 mg Oral Daily  . LORazepam  0-4 mg Intravenous Q6H   Followed by  . [START ON 09/07/2019] LORazepam  0-4 mg Intravenous Q12H  . multivitamin with minerals  1 tablet Oral Daily  . pantoprazole (PROTONIX) IV  40 mg Intravenous Q12H  . thiamine  100 mg Oral Daily   Or  .  thiamine  100 mg Intravenous Daily   Continuous Infusions: . cefTRIAXone (ROCEPHIN)  IV    . dextrose 5 % and 0.9% NaCl 125 mL/hr at 09/05/19 1002     LOS: 0 days   Time spent=35 mins    Wayde Gopaul Arsenio Loader, MD [facility]  If 7PM-7AM, please contact night-coverage  09/05/2019, 12:46 PM

## 2019-09-06 DIAGNOSIS — R1013 Epigastric pain: Secondary | ICD-10-CM

## 2019-09-06 LAB — CBC
HCT: 37.1 % (ref 36.0–46.0)
Hemoglobin: 12.3 g/dL (ref 12.0–15.0)
MCH: 34.7 pg — ABNORMAL HIGH (ref 26.0–34.0)
MCHC: 33.2 g/dL (ref 30.0–36.0)
MCV: 104.8 fL — ABNORMAL HIGH (ref 80.0–100.0)
Platelets: 46 10*3/uL — ABNORMAL LOW (ref 150–400)
RBC: 3.54 MIL/uL — ABNORMAL LOW (ref 3.87–5.11)
RDW: 13.5 % (ref 11.5–15.5)
WBC: 2.6 10*3/uL — ABNORMAL LOW (ref 4.0–10.5)
nRBC: 0 % (ref 0.0–0.2)

## 2019-09-06 LAB — COMPREHENSIVE METABOLIC PANEL
ALT: 60 U/L — ABNORMAL HIGH (ref 0–44)
AST: 125 U/L — ABNORMAL HIGH (ref 15–41)
Albumin: 3.6 g/dL (ref 3.5–5.0)
Alkaline Phosphatase: 66 U/L (ref 38–126)
Anion gap: 8 (ref 5–15)
BUN: <5 mg/dL — ABNORMAL LOW (ref 6–20)
CO2: 22 mmol/L (ref 22–32)
Calcium: 8.1 mg/dL — ABNORMAL LOW (ref 8.9–10.3)
Chloride: 105 mmol/L (ref 98–111)
Creatinine, Ser: 0.32 mg/dL — ABNORMAL LOW (ref 0.44–1.00)
GFR calc Af Amer: >60 mL/min (ref >60)
GFR calc non Af Amer: >60 mL/min (ref >60)
Glucose, Bld: 107 mg/dL — ABNORMAL HIGH (ref 70–99)
Potassium: 3.5 mmol/L (ref 3.5–5.1)
Sodium: 135 mmol/L (ref 135–145)
Total Bilirubin: 1 mg/dL (ref 0.3–1.2)
Total Protein: 6.4 g/dL — ABNORMAL LOW (ref 6.5–8.1)

## 2019-09-06 LAB — MAGNESIUM: Magnesium: 1.9 mg/dL (ref 1.7–2.4)

## 2019-09-06 MED ORDER — DEXTROSE-NACL 5-0.9 % IV SOLN: INTRAVENOUS | Status: AC

## 2019-09-06 MED ORDER — CHLORDIAZEPOXIDE HCL 25 MG PO CAPS
25.0000 mg | ORAL_CAPSULE | Freq: Once | ORAL | Status: AC
  Administered 2019-09-07: 25 mg via ORAL
  Filled 2019-09-06: qty 1

## 2019-09-06 MED ORDER — POTASSIUM CHLORIDE CRYS ER 20 MEQ PO TBCR
40.0000 meq | EXTENDED_RELEASE_TABLET | Freq: Once | ORAL | Status: AC
  Administered 2019-09-06: 10:00:00 40 meq via ORAL
  Filled 2019-09-06: qty 2

## 2019-09-06 MED ORDER — CHLORDIAZEPOXIDE HCL 25 MG PO CAPS
25.0000 mg | ORAL_CAPSULE | Freq: Once | ORAL | Status: AC
  Administered 2019-09-06: 14:00:00 25 mg via ORAL
  Filled 2019-09-06: qty 1

## 2019-09-06 MED ORDER — FAMOTIDINE 20 MG PO TABS
20.0000 mg | ORAL_TABLET | Freq: Two times a day (BID) | ORAL | Status: DC
  Administered 2019-09-06 – 2019-09-07 (×3): 20 mg via ORAL
  Filled 2019-09-06 (×3): qty 1

## 2019-09-06 NOTE — Clinical Social Work Note (Signed)
Provided patient with multiple SA/ETOH resources. Patient was appreciative. No identified barriers to patient initiating services when she is ready.     Aulton Routt, Clydene Pugh, LCSW

## 2019-09-06 NOTE — Plan of Care (Signed)

## 2019-09-06 NOTE — Progress Notes (Signed)
PROGRESS NOTE    Olivia Lester  [id number] DOB: 04-Nov-1975 DOA: 09/04/2019 PCP: Patient, No Pcp Per   Brief Narrative:  44 year old with history of substance abuse, tobacco use, marijuana, alcohol abuse, HTN, tobacco use, hepatitis C came to the hospital complains of intermittent diarrhea, epigastric abdominal pain and alcohol intoxication causing falls.  She was admitted to the hospital with concerns of diarrhea, gastritis, alcohol intoxication with concerns for acute withdrawal.  Urine cultures growing E. coli.   Assessment & Plan:   Active Problems:   Alcohol intoxication ([place])  Alcohol intoxication, improved Alcohol abuse Polysubstance use -Continue alcohol withdrawal protocol.  IV fluids -Advance diet as tolerated.  Still has some poor oral intake -Librium reduced today, last day tomorrow -B12 and folate supplements -Counseled to quit using alcohol. -Closely monitor electrolytes  Acute cystitis without hematuria, E. Coli -Culture sensitivities pending.  Meantime continue IV Rocephin  Epigastric discomfort, improved -We will try and advance diet as tolerated today -CT abdomen pelvis is negative -Lipase 86. -LFTs elevated-alcohol hepatitis pattern. -PPI IV every 12.  Add Pepcid  Hypokalemia-aggressive repletion  Chronic cough-chest x-ray negative, COVID-19-negative.  No other smokers cough versus gastritis/reflux.  Advised to stop using alcohol and tobacco.  Patient is at risk of leaving AGAINST MEDICAL ADVICE  DVT prophylaxis: SCDs Code Status: Full code Family Communication: None Disposition Plan:   Patient From= home  Patient Anticipated D/C place= Home  Barriers= inconsistent oral intake at this time.  Maintain hospital stay until oral intake is consistent, in the meantime requires IV fluids.  Will attempt to advance her diet.  Monitor for any alcohol withdrawal symptoms.  Likely discharge in next 24 hours   Subjective: Still has feeling of some nausea and  epigastric discomfort but improved from yesterday.  Tells me she feels shaky but I do not see any evidence of this at that time.  Review of Systems Otherwise negative except as per HPI, including: General = no fevers, chills, dizziness,  fatigue HEENT/EYES = negative for loss of vision, double vision, blurred vision,  sore throa Cardiovascular= negative for chest pain, palpitation Respiratory/lungs= negative for shortness of breath, cough, wheezing; hemoptysis,  Gastrointestinal= negative for vomiting, abdominal pain Genitourinary= negative for Dysuria MSK = Negative for arthralgia, myalgias Neurology= Negative for headache, numbness, tingling  Psychiatry= Negative for suicidal and homocidal ideation Skin= Negative for Rash   Examination: Constitutional: Not in acute distress Respiratory: Clear to auscultation bilaterally Cardiovascular: Normal sinus rhythm, no rubs Abdomen: Nontender nondistended good bowel sounds Musculoskeletal: No edema noted Skin: No rashes seen Neurologic: CN 2-12 grossly intact.  And nonfocal Psychiatric: Normal judgment and insight. Alert and oriented x 3. Normal mood.     Objective: Vitals:   09/05/19 2300 09/06/19 0002 09/06/19 0518 09/06/19 0630  BP: (!) 149/106 (!) 147/104 (!) 172/113 (!) 142/102  Pulse: 68 71 72 67  Resp:   18   Temp:  98.2 F (36.8 C) 98.2 F (36.8 C)   TempSrc:  Oral Oral   SpO2:  100% 100%   Weight:      Height:        Intake/Output Summary (Last 24 hours) at 09/06/2019 1032 Last data filed at 09/05/2019 1700 Gross per 24 hour  Intake 480 ml  Output --  Net 480 ml   Filed Weights   09/04/19 1201 09/04/19 1829  Weight: 54.4 kg 59.6 kg     Data Reviewed:   CBC: Recent Labs  Lab 09/04/19 1229 09/05/19 0506 09/06/19 0550  WBC  5.8 3.9* 2.6*  HGB 12.9 13.7 12.3  HCT 37.5 40.0 37.1  MCV 103.0* 102.3* 104.8*  PLT 72* 53* 46*   Basic Metabolic Panel: Recent Labs  Lab 09/04/19 1229 09/05/19 0506  09/06/19 0550  NA 140 134* 135  K 3.4* 3.1* 3.5  CL 100 93* 105  CO2 25 27 22   GLUCOSE 79 69* 107*  BUN 8 6 <5*  CREATININE 0.45 0.45 0.32*  CALCIUM 8.7* 8.1* 8.1*  MG 1.3*  --  1.9  PHOS 3.0  --   --    GFR: Estimated Creatinine Clearance: 85.3 mL/min (A) (by C-G formula based on SCr of 0.32 mg/dL (L)). Liver Function Tests: Recent Labs  Lab 09/04/19 1229 09/05/19 0506 09/06/19 0550  AST 271* 204* 125*  ALT 87* 77* 60*  ALKPHOS 73 68 66  BILITOT 0.7 1.3* 1.0  PROT 7.6 7.5 6.4*  ALBUMIN 4.5 4.1 3.6   Recent Labs  Lab 09/04/19 1229  LIPASE 87*   No results for input(s): AMMONIA in the last 168 hours. Coagulation Profile: No results for input(s): INR, PROTIME in the last 168 hours. Cardiac Enzymes: No results for input(s): CKTOTAL, CKMB, CKMBINDEX, TROPONINI in the last 168 hours. BNP (last 3 results) No results for input(s): PROBNP in the last 8760 hours. HbA1C: No results for input(s): HGBA1C in the last 72 hours. CBG: Recent Labs  Lab 09/05/19 0743 09/05/19 1111 09/05/19 1624  GLUCAP 91 83 127*   Lipid Profile: No results for input(s): CHOL, HDL, LDLCALC, TRIG, CHOLHDL, LDLDIRECT in the last 72 hours. Thyroid Function Tests: No results for input(s): TSH, T4TOTAL, FREET4, T3FREE, THYROIDAB in the last 72 hours. Anemia Panel: No results for input(s): VITAMINB12, FOLATE, FERRITIN, TIBC, IRON, RETICCTPCT in the last 72 hours. Sepsis Labs: No results for input(s): PROCALCITON, LATICACIDVEN in the last 168 hours.  Recent Results (from the past 240 hour(s))  Respiratory Panel by RT PCR (Flu A&B, Covid) - Nasopharyngeal Swab     Status: None   Collection Time: 09/04/19  2:54 PM   Specimen: Nasopharyngeal Swab  Result Value Ref Range Status   SARS Coronavirus 2 by RT PCR NEGATIVE NEGATIVE Final    Comment: (NOTE) SARS-CoV-2 target nucleic acids are NOT DETECTED. The SARS-CoV-2 RNA is generally detectable in upper respiratoy specimens during the acute phase  of infection. The lowest concentration of SARS-CoV-2 viral copies this assay can detect is 131 copies/mL. A negative result does not preclude SARS-Cov-2 infection and should not be used as the sole basis for treatment or other patient management decisions. A negative result may occur with  improper specimen collection/handling, submission of specimen other than nasopharyngeal swab, presence of viral mutation(s) within the areas targeted by this assay, and inadequate number of viral copies (<131 copies/mL). A negative result must be combined with clinical observations, patient history, and epidemiological information. The expected result is Negative. Fact Sheet for Patients:  PinkCheek.be Fact Sheet for Healthcare Providers:  GravelBags.it This test is not yet ap proved or cleared by the [place] FDA and  has been authorized for detection and/or diagnosis of SARS-CoV-2 by FDA under an Emergency Use Authorization (EUA). This EUA will remain  in effect (meaning this test can be used) for the duration of the COVID-19 declaration under Section 564(b)(1) of the Act, 21 U.S.C. section 360bbb-3(b)(1), unless the authorization is terminated or revoked sooner.    Influenza A by PCR NEGATIVE NEGATIVE Final   Influenza B by PCR NEGATIVE NEGATIVE Final    Comment: (  NOTE) The Xpert Xpress SARS-CoV-2/FLU/RSV assay is intended as an aid in  the diagnosis of influenza from Nasopharyngeal swab specimens and  should not be used as a sole basis for treatment. Nasal washings and  aspirates are unacceptable for Xpert Xpress SARS-CoV-2/FLU/RSV  testing. Fact Sheet for Patients: PinkCheek.be Fact Sheet for Healthcare Providers: GravelBags.it This test is not yet approved or cleared by the [place] FDA and  has been authorized for detection and/or diagnosis of SARS-CoV-2 by  FDA  under an Emergency Use Authorization (EUA). This EUA will remain  in effect (meaning this test can be used) for the duration of the  Covid-19 declaration under Section 564(b)(1) of the Act, 21  U.S.C. section 360bbb-3(b)(1), unless the authorization is  terminated or revoked. Performed at [facility], [address] [place], [place] [address]   Urine culture     Status: Abnormal (Preliminary result)   Collection Time: 09/04/19  3:26 PM   Specimen: Urine, Random  Result Value Ref Range Status   Specimen Description   Final    URINE, RANDOM Performed at [facility], [address] [place], [place] [address]    Special Requests   Final    NONE Performed at [facility], [address], [place], [place] [address]    Culture (A)  Final    >=100,000 COLONIES/mL ESCHERICHIA COLI CULTURE REINCUBATED FOR BETTER GROWTH SUSCEPTIBILITIES TO FOLLOW Performed at [facility], [place] [address] [place], [place] [address]    Report Status PENDING  Incomplete         Radiology Studies: CT Head Wo Contrast  Result Date: 09/04/2019 CLINICAL DATA:  Ataxia, head trauma alcohol withdrawl Patient reports seizure 2 nights ago with fall. EXAM: CT HEAD WITHOUT CONTRAST TECHNIQUE: Contiguous axial images were obtained from the base of the skull through the vertex without intravenous contrast. COMPARISON:  Head CT 09/25/2013 FINDINGS: Brain: No intracranial hemorrhage, mass effect, or midline shift. No hydrocephalus. The basilar cisterns are patent. No evidence of territorial infarct or acute ischemia. No evidence of pontine hypodensity. No extra-axial or intracranial fluid collection. Vascular: No hyperdense vessel or unexpected calcification. Skull: No fracture or focal lesion. Sinuses/Orbits: Paranasal sinuses and mastoid air cells are clear. The visualized orbits are unremarkable. Other: None. IMPRESSION: Unremarkable noncontrast head CT. Electronically Signed   By: Keith Rake M.D.   On:  09/04/2019 13:53   CT ABDOMEN PELVIS W CONTRAST  Result Date: 09/04/2019 CLINICAL DATA:  Nausea, vomiting EXAM: CT ABDOMEN AND PELVIS WITH CONTRAST TECHNIQUE: Multidetector CT imaging of the abdomen and pelvis was performed using the standard protocol following bolus administration of intravenous contrast. CONTRAST:  46mL OMNIPAQUE IOHEXOL 300 MG/ML  SOLN COMPARISON:  03/31/2019 FINDINGS: Lower chest: Lung bases are clear. No effusions. Heart is normal size. Hepatobiliary: Severe diffuse fatty infiltration of the liver. No focal abnormality. Gallbladder unremarkable. Common bowel duct prominent at 8 mm. No visible ductal stone or mass. This is stable since prior study. Pancreas: No focal abnormality or ductal dilatation. No surrounding inflammation currently. Spleen: No focal abnormality.  Normal size. Adrenals/Urinary Tract: No adrenal abnormality. No focal renal abnormality. No stones or hydronephrosis. Urinary bladder is unremarkable. Stomach/Bowel: Normal appendix. Stomach, large and small bowel grossly unremarkable. Vascular/Lymphatic: Aortic atherosclerosis. No enlarged abdominal or pelvic lymph nodes. Reproductive: Uterus and left adnexar unremarkable. 2.1 cm fat containing mass in the right ovary compatible with dermoid. Other: No free fluid or free air. Musculoskeletal: Postoperative changes in the lumbar spine. No acute bony  abnormality. IMPRESSION: Severe diffuse fatty infiltration of the liver. Stable common bile duct prominence, 8 mm. Stable right ovarian dermoid. Aortic atherosclerosis. No acute findings. Electronically Signed   By: Rolm Baptise M.D.   On: 09/04/2019 22:26   DG Chest Port 1 View  Result Date: 09/04/2019 CLINICAL DATA:  Cough.  Vomiting and diarrhea. EXAM: PORTABLE CHEST 1 VIEW COMPARISON:  06/24/2019 FINDINGS: The heart size and mediastinal contours are within normal limits. Both lungs are clear. The visualized skeletal structures are unremarkable. IMPRESSION: Normal exam.  Electronically Signed   By: Lorriane Shire M.D.   On: 09/04/2019 12:59        Scheduled Meds: . chlordiazePOXIDE  25 mg Oral Once   Followed by  . [START ON 09/07/2019] chlordiazePOXIDE  25 mg Oral Once  . famotidine  20 mg Oral BID  . folic acid  1 mg Oral Daily  . LORazepam  0-4 mg Intravenous Q6H   Followed by  . [START ON 09/07/2019] LORazepam  0-4 mg Intravenous Q12H  . multivitamin with minerals  1 tablet Oral Daily  . pantoprazole (PROTONIX) IV  40 mg Intravenous Q12H  . thiamine  100 mg Oral Daily   Or  . thiamine  100 mg Intravenous Daily   Continuous Infusions: . cefTRIAXone (ROCEPHIN)  IV 1 g (09/05/19 1430)  . dextrose 5 % and 0.9% NaCl 125 mL/hr at 09/06/19 1013     LOS: 1 day   Time spent=35 mins    Sandhya Denherder Arsenio Loader, MD [facility]  If 7PM-7AM, please contact night-coverage  09/06/2019, 10:32 AM

## 2019-09-07 DIAGNOSIS — N390 Urinary tract infection, site not specified: Secondary | ICD-10-CM

## 2019-09-07 DIAGNOSIS — F10929 Alcohol use, unspecified with intoxication, unspecified: Secondary | ICD-10-CM

## 2019-09-07 DIAGNOSIS — F17200 Nicotine dependence, unspecified, uncomplicated: Secondary | ICD-10-CM

## 2019-09-07 DIAGNOSIS — D696 Thrombocytopenia, unspecified: Secondary | ICD-10-CM

## 2019-09-07 DIAGNOSIS — F10931 Alcohol use, unspecified with withdrawal delirium: Secondary | ICD-10-CM

## 2019-09-07 DIAGNOSIS — R748 Abnormal levels of other serum enzymes: Secondary | ICD-10-CM

## 2019-09-07 DIAGNOSIS — I10 Essential (primary) hypertension: Secondary | ICD-10-CM

## 2019-09-07 DIAGNOSIS — F10231 Alcohol dependence with withdrawal delirium: Secondary | ICD-10-CM

## 2019-09-07 DIAGNOSIS — K2920 Alcoholic gastritis without bleeding: Principal | ICD-10-CM

## 2019-09-07 LAB — CBC
HCT: 38.2 % (ref 36.0–46.0)
Hemoglobin: 12.3 g/dL (ref 12.0–15.0)
MCH: 34.6 pg — ABNORMAL HIGH (ref 26.0–34.0)
MCHC: 32.2 g/dL (ref 30.0–36.0)
MCV: 107.6 fL — ABNORMAL HIGH (ref 80.0–100.0)
Platelets: 55 10*3/uL — ABNORMAL LOW (ref 150–400)
RBC: 3.55 MIL/uL — ABNORMAL LOW (ref 3.87–5.11)
RDW: 13.4 % (ref 11.5–15.5)
WBC: 3.3 10*3/uL — ABNORMAL LOW (ref 4.0–10.5)
nRBC: 0 % (ref 0.0–0.2)

## 2019-09-07 LAB — COMPREHENSIVE METABOLIC PANEL WITH GFR
ALT: 73 U/L — ABNORMAL HIGH (ref 0–44)
AST: 143 U/L — ABNORMAL HIGH (ref 15–41)
Albumin: 3.6 g/dL (ref 3.5–5.0)
Alkaline Phosphatase: 62 U/L (ref 38–126)
Anion gap: 10 (ref 5–15)
BUN: <5 mg/dL — ABNORMAL LOW (ref 6–20)
CO2: 20 mmol/L — ABNORMAL LOW (ref 22–32)
Calcium: 8.4 mg/dL — ABNORMAL LOW (ref 8.9–10.3)
Chloride: 105 mmol/L (ref 98–111)
Creatinine, Ser: 0.38 mg/dL — ABNORMAL LOW (ref 0.44–1.00)
GFR calc Af Amer: >60 mL/min
GFR calc non Af Amer: >60 mL/min
Glucose, Bld: 96 mg/dL (ref 70–99)
Potassium: 3.5 mmol/L (ref 3.5–5.1)
Sodium: 135 mmol/L (ref 135–145)
Total Bilirubin: 0.7 mg/dL (ref 0.3–1.2)
Total Protein: 6.5 g/dL (ref 6.5–8.1)

## 2019-09-07 LAB — URINE CULTURE: Culture: >=100000 — AB

## 2019-09-07 LAB — MAGNESIUM: Magnesium: 1.7 mg/dL (ref 1.7–2.4)

## 2019-09-07 MED ORDER — CEFDINIR 300 MG PO CAPS: 300.0000 mg | ORAL_CAPSULE | Freq: Two times a day (BID) | ORAL | 0 refills | Status: DC

## 2019-09-07 MED ORDER — AMLODIPINE BESYLATE 5 MG PO TABS: 5.0000 mg | ORAL_TABLET | Freq: Every day | ORAL | 1 refills | Status: DC

## 2019-09-07 MED ORDER — CEFDINIR 300 MG PO CAPS
300.0000 mg | ORAL_CAPSULE | Freq: Two times a day (BID) | ORAL | Status: DC
  Administered 2019-09-07: 300 mg via ORAL

## 2019-09-07 MED ORDER — AMLODIPINE BESYLATE 5 MG PO TABS: 5.0000 mg | ORAL_TABLET | Freq: Every day | ORAL | Status: DC

## 2019-09-07 MED ORDER — MAGNESIUM SULFATE 2 GM/50ML IV SOLN
2.0000 g | Freq: Once | INTRAVENOUS | Status: AC
  Administered 2019-09-07: 13:00:00 2 g via INTRAVENOUS
  Filled 2019-09-07: qty 50

## 2019-09-07 NOTE — Plan of Care (Signed)

## 2019-09-07 NOTE — Discharge Summary (Addendum)
Physician Discharge Summary  Olivia Lester [id number] DOB: 1975/08/06 DOA: 09/04/2019  PCP: Patient, No Pcp Per  Admit date: 09/04/2019 Discharge date: 09/07/2019  Admitted From: Home Disposition:  Home   Recommendations for Outpatient Follow-up:  1. Follow up with PCP in 1-2 weeks 2. Please obtain BMP/CBC in one week     Discharge Condition: Stable CODE STATUS: FULL Diet recommendation: Heart Healthy   Brief/Interim Summary: 44 year old female with a history of alcohol abuse, hepatitis C,anxiety, chronic back painpresenting with nausea, vomiting, abdominal pain that began in the early a.m.09/04/19.  Patient reports typically drinking an 18 pack or half a gallon of liquor daily, last had a beer this morning at 2 AM. Emesis is reported to be green and nonbloody, diarrhea is loose and nonbloody. Abdominal pain located in epigastric area, does not radiate, is constant. Patient is tremulous, reports hallucinations, relates her symptoms to alcohol withdrawal.  The patient was was admitted to the hospital from 06/24/2019 to 06/25/2019 secondary to alcohol withdrawal.  She also had another recent admission from 03/31/2019 to 04/04/2019 for alcohol induced pancreatitis.  During her September admission, the patient also had alcohol withdrawal. She was admitted to the hospital with concerns of diarrhea,alcohol gastritis, alcohol intoxication with concerns for acute withdrawal.  Urine cultures growing E. coli.  Discharge Diagnoses:  Abdominal pain and vomiting -Secondary to alcoholic induced gastritis -Lipase 87 -09/03/2018 CT abdomen pelvis--fatty liver, prominent CBD 8 mm, no other acute findings -03/11/2019 MRCP--Mildly dilated common bile duct (8 mm diameter). No significant intrahepatic biliary ductal dilatation. No evidence of choledocholithiasis. No biliary strictures or masses -continue protonix -pt also likely has component of cannabis hyperemesis syndrome -her diet was advanced to a  soft diet which she was able to tolerate  Polysubstance abuse -Including tobacco, alcohol, opioids, marijuana -PMP Aware queried--last opioid prescription for Percocet on 09/06/2018 and norco 5/325 on 05/19/19, #8 -no opioids will be prescribed at time of d/c  EColi UTI -initially started on ceftriaxon -switch to cefdinir--3 more days to finish one week  Tobacco abuse -NicoDerm patch  Alcohol abuse and withdrawal -Alcohol withdrawal protocol -CIWA scores consistently <8 x nearly 24 hours -no DTs  Severe protein calorie malnutrition -Multifactorial from all of the above  Chronic thrombocytopenia -from EtOH abuse, monitor  Hypertension -Blood pressure uncontrolled -not on any agents as outpt -start amlodipine  Loose stools -likely due to pancreatic insufficiency -no leukocytosis or worsening abd pain   Discharge Instructions   Allergies as of 09/07/2019      Reactions   Toradol [ketorolac Tromethamine] Other (See Comments)   Patient advised not to take due to ulcers   Asa [aspirin] Rash      Medication List    STOP taking these medications   ibuprofen 200 MG tablet Commonly known as: ADVIL     TAKE these medications   cefdinir 300 MG capsule Commonly known as: OMNICEF Take 1 capsule (300 mg total) by mouth every 12 (twelve) hours.   diphenhydrAMINE 25 mg capsule Commonly known as: BENADRYL Take 25 mg by mouth daily as needed for allergies.   multivitamin with minerals Tabs tablet Take 1 tablet by mouth every other day.       Allergies  Allergen Reactions  . Toradol [Ketorolac Tromethamine] Other (See Comments)    Patient advised not to take due to ulcers  . Asa [Aspirin] Rash    Consultations:  none   Procedures/Studies: CT Head Wo Contrast  Result Date: 09/04/2019 CLINICAL DATA:  Ataxia, head trauma alcohol  withdrawl Patient reports seizure 2 nights ago with fall. EXAM: CT HEAD WITHOUT CONTRAST TECHNIQUE: Contiguous axial images  were obtained from the base of the skull through the vertex without intravenous contrast. COMPARISON:  Head CT 09/25/2013 FINDINGS: Brain: No intracranial hemorrhage, mass effect, or midline shift. No hydrocephalus. The basilar cisterns are patent. No evidence of territorial infarct or acute ischemia. No evidence of pontine hypodensity. No extra-axial or intracranial fluid collection. Vascular: No hyperdense vessel or unexpected calcification. Skull: No fracture or focal lesion. Sinuses/Orbits: Paranasal sinuses and mastoid air cells are clear. The visualized orbits are unremarkable. Other: None. IMPRESSION: Unremarkable noncontrast head CT. Electronically Signed   By: Keith Rake M.D.   On: 09/04/2019 13:53   CT ABDOMEN PELVIS W CONTRAST  Result Date: 09/04/2019 CLINICAL DATA:  Nausea, vomiting EXAM: CT ABDOMEN AND PELVIS WITH CONTRAST TECHNIQUE: Multidetector CT imaging of the abdomen and pelvis was performed using the standard protocol following bolus administration of intravenous contrast. CONTRAST:  25mL OMNIPAQUE IOHEXOL 300 MG/ML  SOLN COMPARISON:  03/31/2019 FINDINGS: Lower chest: Lung bases are clear. No effusions. Heart is normal size. Hepatobiliary: Severe diffuse fatty infiltration of the liver. No focal abnormality. Gallbladder unremarkable. Common bowel duct prominent at 8 mm. No visible ductal stone or mass. This is stable since prior study. Pancreas: No focal abnormality or ductal dilatation. No surrounding inflammation currently. Spleen: No focal abnormality.  Normal size. Adrenals/Urinary Tract: No adrenal abnormality. No focal renal abnormality. No stones or hydronephrosis. Urinary bladder is unremarkable. Stomach/Bowel: Normal appendix. Stomach, large and small bowel grossly unremarkable. Vascular/Lymphatic: Aortic atherosclerosis. No enlarged abdominal or pelvic lymph nodes. Reproductive: Uterus and left adnexar unremarkable. 2.1 cm fat containing mass in the right ovary compatible with  dermoid. Other: No free fluid or free air. Musculoskeletal: Postoperative changes in the lumbar spine. No acute bony abnormality. IMPRESSION: Severe diffuse fatty infiltration of the liver. Stable common bile duct prominence, 8 mm. Stable right ovarian dermoid. Aortic atherosclerosis. No acute findings. Electronically Signed   By: Rolm Baptise M.D.   On: 09/04/2019 22:26   DG Chest Port 1 View  Result Date: 09/04/2019 CLINICAL DATA:  Cough.  Vomiting and diarrhea. EXAM: PORTABLE CHEST 1 VIEW COMPARISON:  06/24/2019 FINDINGS: The heart size and mediastinal contours are within normal limits. Both lungs are clear. The visualized skeletal structures are unremarkable. IMPRESSION: Normal exam. Electronically Signed   By: Lorriane Shire M.D.   On: 09/04/2019 12:59         Discharge Exam: Vitals:   09/07/19 0518 09/07/19 1200  BP: (!) 167/107 (!) 141/100  Pulse: 65 76  Resp: 17 18  Temp: 98.3 F (36.8 C)   SpO2: 100% 100%   Vitals:   09/07/19 0000 09/07/19 0105 09/07/19 0518 09/07/19 1200  BP: (!) 154/98 (!) 156/96 (!) 167/107 (!) 141/100  Pulse: 69 70 65 76  Resp:   17 18  Temp:   98.3 F (36.8 C)   TempSrc:      SpO2:   100% 100%  Weight:      Height:        General: Pt is alert, awake, not in acute distress Cardiovascular: RRR, S1/S2 +, no rubs, no gallops Respiratory: CTA bilaterally, no wheezing, no rhonchi Abdominal: Soft, NT, ND, bowel sounds + Extremities: no edema, no cyanosis   The results of significant diagnostics from this hospitalization (including imaging, microbiology, ancillary and laboratory) are listed below for reference.    Significant Diagnostic Studies: CT Head Wo Contrast  Result  Date: 09/04/2019 CLINICAL DATA:  Ataxia, head trauma alcohol withdrawl Patient reports seizure 2 nights ago with fall. EXAM: CT HEAD WITHOUT CONTRAST TECHNIQUE: Contiguous axial images were obtained from the base of the skull through the vertex without intravenous contrast.  COMPARISON:  Head CT 09/25/2013 FINDINGS: Brain: No intracranial hemorrhage, mass effect, or midline shift. No hydrocephalus. The basilar cisterns are patent. No evidence of territorial infarct or acute ischemia. No evidence of pontine hypodensity. No extra-axial or intracranial fluid collection. Vascular: No hyperdense vessel or unexpected calcification. Skull: No fracture or focal lesion. Sinuses/Orbits: Paranasal sinuses and mastoid air cells are clear. The visualized orbits are unremarkable. Other: None. IMPRESSION: Unremarkable noncontrast head CT. Electronically Signed   By: Keith Rake M.D.   On: 09/04/2019 13:53   CT ABDOMEN PELVIS W CONTRAST  Result Date: 09/04/2019 CLINICAL DATA:  Nausea, vomiting EXAM: CT ABDOMEN AND PELVIS WITH CONTRAST TECHNIQUE: Multidetector CT imaging of the abdomen and pelvis was performed using the standard protocol following bolus administration of intravenous contrast. CONTRAST:  44mL OMNIPAQUE IOHEXOL 300 MG/ML  SOLN COMPARISON:  03/31/2019 FINDINGS: Lower chest: Lung bases are clear. No effusions. Heart is normal size. Hepatobiliary: Severe diffuse fatty infiltration of the liver. No focal abnormality. Gallbladder unremarkable. Common bowel duct prominent at 8 mm. No visible ductal stone or mass. This is stable since prior study. Pancreas: No focal abnormality or ductal dilatation. No surrounding inflammation currently. Spleen: No focal abnormality.  Normal size. Adrenals/Urinary Tract: No adrenal abnormality. No focal renal abnormality. No stones or hydronephrosis. Urinary bladder is unremarkable. Stomach/Bowel: Normal appendix. Stomach, large and small bowel grossly unremarkable. Vascular/Lymphatic: Aortic atherosclerosis. No enlarged abdominal or pelvic lymph nodes. Reproductive: Uterus and left adnexar unremarkable. 2.1 cm fat containing mass in the right ovary compatible with dermoid. Other: No free fluid or free air. Musculoskeletal: Postoperative changes in the  lumbar spine. No acute bony abnormality. IMPRESSION: Severe diffuse fatty infiltration of the liver. Stable common bile duct prominence, 8 mm. Stable right ovarian dermoid. Aortic atherosclerosis. No acute findings. Electronically Signed   By: Rolm Baptise M.D.   On: 09/04/2019 22:26   DG Chest Port 1 View  Result Date: 09/04/2019 CLINICAL DATA:  Cough.  Vomiting and diarrhea. EXAM: PORTABLE CHEST 1 VIEW COMPARISON:  06/24/2019 FINDINGS: The heart size and mediastinal contours are within normal limits. Both lungs are clear. The visualized skeletal structures are unremarkable. IMPRESSION: Normal exam. Electronically Signed   By: Lorriane Shire M.D.   On: 09/04/2019 12:59     Microbiology: Recent Results (from the past 240 hour(s))  Respiratory Panel by RT PCR (Flu A&B, Covid) - Nasopharyngeal Swab     Status: None   Collection Time: 09/04/19  2:54 PM   Specimen: Nasopharyngeal Swab  Result Value Ref Range Status   SARS Coronavirus 2 by RT PCR NEGATIVE NEGATIVE Final    Comment: (NOTE) SARS-CoV-2 target nucleic acids are NOT DETECTED. The SARS-CoV-2 RNA is generally detectable in upper respiratoy specimens during the acute phase of infection. The lowest concentration of SARS-CoV-2 viral copies this assay can detect is 131 copies/mL. A negative result does not preclude SARS-Cov-2 infection and should not be used as the sole basis for treatment or other patient management decisions. A negative result may occur with  improper specimen collection/handling, submission of specimen other than nasopharyngeal swab, presence of viral mutation(s) within the areas targeted by this assay, and inadequate number of viral copies (<131 copies/mL). A negative result must be combined with clinical observations, patient history,  and epidemiological information. The expected result is Negative. Fact Sheet for Patients:  PinkCheek.be Fact Sheet for Healthcare Providers:   GravelBags.it This test is not yet ap proved or cleared by the [place] FDA and  has been authorized for detection and/or diagnosis of SARS-CoV-2 by FDA under an Emergency Use Authorization (EUA). This EUA will remain  in effect (meaning this test can be used) for the duration of the COVID-19 declaration under Section 564(b)(1) of the Act, 21 U.S.C. section 360bbb-3(b)(1), unless the authorization is terminated or revoked sooner.    Influenza A by PCR NEGATIVE NEGATIVE Final   Influenza B by PCR NEGATIVE NEGATIVE Final    Comment: (NOTE) The Xpert Xpress SARS-CoV-2/FLU/RSV assay is intended as an aid in  the diagnosis of influenza from Nasopharyngeal swab specimens and  should not be used as a sole basis for treatment. Nasal washings and  aspirates are unacceptable for Xpert Xpress SARS-CoV-2/FLU/RSV  testing. Fact Sheet for Patients: PinkCheek.be Fact Sheet for Healthcare Providers: GravelBags.it This test is not yet approved or cleared by the [place] FDA and  has been authorized for detection and/or diagnosis of SARS-CoV-2 by  FDA under an Emergency Use Authorization (EUA). This EUA will remain  in effect (meaning this test can be used) for the duration of the  Covid-19 declaration under Section 564(b)(1) of the Act, 21  U.S.C. section 360bbb-3(b)(1), unless the authorization is  terminated or revoked. Performed at [facility], [address], [place], [place] [address]   Urine culture     Status: Abnormal   Collection Time: 09/04/19  3:26 PM   Specimen: Urine, Random  Result Value Ref Range Status   Specimen Description   Final    URINE, RANDOM Performed at [facility], [address] [place], [place] [address]    Special Requests   Final    NONE Performed at [facility], [address] [place], [place] [address]    Culture >=100,000 COLONIES/mL ESCHERICHIA COLI (A)  Final    Report Status 09/07/2019 FINAL  Final   Organism ID, Bacteria ESCHERICHIA COLI (A)  Final      Susceptibility   Escherichia coli - MIC*    AMPICILLIN 8 SENSITIVE Sensitive     CEFAZOLIN <=4 SENSITIVE Sensitive     CEFTRIAXONE <=0.25 SENSITIVE Sensitive     CIPROFLOXACIN <=0.25 SENSITIVE Sensitive     GENTAMICIN <=1 SENSITIVE Sensitive     IMIPENEM <=0.25 SENSITIVE Sensitive     NITROFURANTOIN <=16 SENSITIVE Sensitive     TRIMETH/SULFA <=20 SENSITIVE Sensitive     AMPICILLIN/SULBACTAM 8 SENSITIVE Sensitive     PIP/TAZO <=4 SENSITIVE Sensitive     * >=100,000 COLONIES/mL ESCHERICHIA COLI     Labs: Basic Metabolic Panel: Recent Labs  Lab 09/04/19 1229 09/04/19 1229 09/05/19 0506 09/05/19 0506 09/06/19 0550 09/07/19 0600  NA 140  --  134*  --  135 135  K 3.4*   < > 3.1*   < > 3.5 3.5  CL 100  --  93*  --  105 105  CO2 25  --  27  --  22 20*  GLUCOSE 79  --  69*  --  107* 96  BUN 8  --  6  --  <5* <5*  CREATININE 0.45  --  0.45  --  0.32* 0.38*  CALCIUM 8.7*  --  8.1*  --  8.1* 8.4*  MG 1.3*  --   --   --  1.9 1.7  PHOS  3.0  --   --   --   --   --    < > = values in this interval not displayed.   Liver Function Tests: Recent Labs  Lab 09/04/19 1229 09/05/19 0506 09/06/19 0550 09/07/19 0600  AST 271* 204* 125* 143*  ALT 87* 77* 60* 73*  ALKPHOS 73 68 66 62  BILITOT 0.7 1.3* 1.0 0.7  PROT 7.6 7.5 6.4* 6.5  ALBUMIN 4.5 4.1 3.6 3.6   Recent Labs  Lab 09/04/19 1229  LIPASE 87*   No results for input(s): AMMONIA in the last 168 hours. CBC: Recent Labs  Lab 09/04/19 1229 09/05/19 0506 09/06/19 0550 09/07/19 0600  WBC 5.8 3.9* 2.6* 3.3*  HGB 12.9 13.7 12.3 12.3  HCT 37.5 40.0 37.1 38.2  MCV 103.0* 102.3* 104.8* 107.6*  PLT 72* 53* 46* 55*   Cardiac Enzymes: No results for input(s): CKTOTAL, CKMB, CKMBINDEX, TROPONINI in the last 168 hours. BNP: Invalid input(s): POCBNP CBG: Recent Labs  Lab 09/05/19 0743 09/05/19 1111 09/05/19 1624  GLUCAP 91 83  127*    Time coordinating discharge:  36 minutes  Signed:  Orson Eva, DO [facility] Pager: [phone] 09/07/2019, 3:45 PM

## 2019-09-07 NOTE — Progress Notes (Signed)
Pt walking halls. Removed her tele. States she is ready to go if we are not going to give her any more ativan.  Notified md.

## 2019-09-07 NOTE — Progress Notes (Signed)
Nsg Discharge Note  Admit Date:  09/04/2019 Discharge date: 09/07/2019   Olivia Lester to be D/C'd Home per MD order.  AVS completed.  Copy for chart, and copy for patient signed, and dated. Reviewed d/c paperwork with patient. Reviewed new medications.  Patient/caregiver able to verbalize understanding. Answered all questions. Dr. Carles Collet gave her her work note. Walked stable patient to short stay entrance where she sat waiting for her boyfriend.  Discharge Medication: Allergies as of 09/07/2019      Reactions   Toradol [ketorolac Tromethamine] Other (See Comments)   Patient advised not to take due to ulcers   Asa [aspirin] Rash      Medication List    STOP taking these medications   ibuprofen 200 MG tablet Commonly known as: ADVIL     TAKE these medications   amLODipine 5 MG tablet Commonly known as: NORVASC Take 1 tablet (5 mg total) by mouth daily.   cefdinir 300 MG capsule Commonly known as: OMNICEF Take 1 capsule (300 mg total) by mouth every 12 (twelve) hours.   diphenhydrAMINE 25 mg capsule Commonly known as: BENADRYL Take 25 mg by mouth daily as needed for allergies.   multivitamin with minerals Tabs tablet Take 1 tablet by mouth every other day.       Discharge Assessment: Vitals:   09/07/19 0518 09/07/19 1200  BP: (!) 167/107 (!) 141/100  Pulse: 65 76  Resp: 17 18  Temp: 98.3 F (36.8 C)   SpO2: 100% 100%   Skin clean, dry and intact without evidence of skin break down, no evidence of skin tears noted. IV catheter discontinued intact. Site without signs and symptoms of complications - no redness or edema noted at insertion site, patient denies c/o pain - only slight tenderness at site.  Dressing with slight pressure applied.  D/c Instructions-Education: Discharge instructions given to patient/family with verbalized understanding. D/c education completed with patient/family including follow up instructions, medication list, d/c activities limitations if  indicated, with other d/c instructions as indicated by MD - patient able to verbalize understanding, all questions fully answered. Patient instructed to return to ED, call 911, or call MD for any changes in condition.  Patient escorted via [place], and D/C home via private auto.  Santa Lighter, RN 09/07/2019 4:24 PM

## 2019-09-07 NOTE — Progress Notes (Addendum)
PROGRESS NOTE  Denay Lutterman [id number] DOB: [birth year] DOA: 09/04/2019 PCP: Patient, No Pcp Per  Brief History:  44 year old female with a history of alcohol abuse, hepatitis C,anxiety, chronic back painpresenting with nausea, vomiting, abdominal pain that began in the early a.m.09/04/19.  Patient reports typically drinking an 18 pack or half a gallon of liquor daily, last had a beer this morning at 2 AM.   Emesis is reported to be green and nonbloody, diarrhea is loose and nonbloody.  Abdominal pain located in epigastric area, does not radiate, is constant.  Patient is tremulous, reports hallucinations, relates her symptoms to alcohol withdrawal.  The patient was was admitted to the hospital from 06/24/2019 to 06/25/2019 secondary to alcohol withdrawal.  She also had another recent admission from 03/31/2019 to 04/04/2019 for alcohol induced pancreatitis.  During her September admission, the patient also had alcohol withdrawal.  Assessment/Plan: Abdominal pain and vomiting -Secondary to alcoholic induced gastritis -Lipase 87 -09/03/2018 CT abdomen pelvis--fatty liver, prominent CBD 8 mm, no other acute findings -03/11/2019 MRCP--Mildly dilated common bile duct (8 mm diameter). No significant intrahepatic biliary ductal dilatation. No evidence of choledocholithiasis. No biliary strictures or masses -continue protonix -pt also likely has component of cannabis hyperemesis syndrome  Polysubstance abuse -Including tobacco, alcohol, opioids, marijuana -PMP Aware queried--last opioid prescription for Percocet on 09/06/2018  [facility] UTI -initially started on ceftriaxon -switch to cefdinir  Tobacco abuse -NicoDerm patch  Alcohol abuse and withdrawal -Alcohol withdrawal protocol  Severe protein calorie malnutrition -Multifactorial from all of the above  Chronic thrombocytopenia -from EtOH abuse, monitor  Hypertension -Blood pressure uncontrolled  Loose stools -likely due to  pancreatic insufficiency -no leukocytosis or worsening abd pain    Disposition Plan:   Home when able to tolerate soft diet Family Communication:  No Family at bedside  Consultants:  none  Code Status:  FULL   DVT Prophylaxis:  SCDs   Procedures: As Listed in Progress Note Above  Antibiotics: Ceftriaxone 2/10>>> Cefdinir 2/13>>>       Subjective: Pt continues to have epigastric abdominal pain, but slightly better than 3 days ago.  Denies any vomiting, but has some nausea.  Denies f/c, cp, sob.  She has loose stool without hematochezia or melena  Objective: Vitals:   09/06/19 2218 09/07/19 0000 09/07/19 0105 09/07/19 0518  BP: (!) 156/96 (!) 154/98 (!) 156/96 (!) 167/107  Pulse: 69 69 70 65  Resp:    17  Temp:    98.3 F (36.8 C)  TempSrc:      SpO2:    100%  Weight:      Height:        Intake/Output Summary (Last 24 hours) at 09/07/2019 1006 Last data filed at 09/07/2019 [id number] Gross per 24 hour  Intake 957.42 ml  Output 550 ml  Net 407.42 ml   Weight change:  Exam:   General:  Pt is alert, follows commands appropriately, not in acute distress  HEENT: No icterus, No thrush, No neck mass, [place]/AT  Cardiovascular: RRR, S1/S2, no rubs, no gallops  Respiratory: CTA bilaterally, no wheezing, no crackles, no rhonchi  Abdomen: Soft/+BS, epigastric tender, non distended, no guarding  Extremities: No edema, No lymphangitis, No petechiae, No rashes, no synovitis   Data Reviewed: I have personally reviewed following labs and imaging studies Basic Metabolic Panel: Recent Labs  Lab 09/04/19 1229 09/05/19 0506 09/06/19 0550 09/07/19 0600  NA 140 134* 135 135  K 3.4* 3.1* 3.5  3.5  CL 100 93* 105 105  CO2 25 27 22  20*  GLUCOSE 79 69* 107* 96  BUN 8 6 <5* <5*  CREATININE 0.45 0.45 0.32* 0.38*  CALCIUM 8.7* 8.1* 8.1* 8.4*  MG 1.3*  --  1.9 1.7  PHOS 3.0  --   --   --    Liver Function Tests: Recent Labs  Lab 09/04/19 1229 09/05/19 0506  09/06/19 0550 09/07/19 0600  AST 271* 204* 125* 143*  ALT 87* 77* 60* 73*  ALKPHOS 73 68 66 62  BILITOT 0.7 1.3* 1.0 0.7  PROT 7.6 7.5 6.4* 6.5  ALBUMIN 4.5 4.1 3.6 3.6   Recent Labs  Lab 09/04/19 1229  LIPASE 87*   No results for input(s): AMMONIA in the last 168 hours. Coagulation Profile: No results for input(s): INR, PROTIME in the last 168 hours. CBC: Recent Labs  Lab 09/04/19 1229 09/05/19 0506 09/06/19 0550 09/07/19 0600  WBC 5.8 3.9* 2.6* 3.3*  HGB 12.9 13.7 12.3 12.3  HCT 37.5 40.0 37.1 38.2  MCV 103.0* 102.3* 104.8* 107.6*  PLT 72* 53* 46* 55*   Cardiac Enzymes: No results for input(s): CKTOTAL, CKMB, CKMBINDEX, TROPONINI in the last 168 hours. BNP: Invalid input(s): POCBNP CBG: Recent Labs  Lab 09/05/19 0743 09/05/19 1111 09/05/19 1624  GLUCAP 91 83 127*   HbA1C: No results for input(s): HGBA1C in the last 72 hours. Urine analysis:    Component Value Date/Time   COLORURINE AMBER (A) 09/04/2019 1204   APPEARANCEUR HAZY (A) 09/04/2019 1204   APPEARANCEUR Clear 12/07/2013 0133   LABSPEC 1.020 09/04/2019 1204   LABSPEC 1.006 12/07/2013 0133   PHURINE 6.0 09/04/2019 1204   GLUCOSEU NEGATIVE 09/04/2019 1204   GLUCOSEU Negative 12/07/2013 0133   HGBUR SMALL (A) 09/04/2019 1204   BILIRUBINUR NEGATIVE 09/04/2019 1204   BILIRUBINUR Negative 12/07/2013 0133   KETONESUR NEGATIVE 09/04/2019 1204   PROTEINUR 30 (A) 09/04/2019 1204   UROBILINOGEN 0.2 04/28/2015 1559   NITRITE POSITIVE (A) 09/04/2019 1204   LEUKOCYTESUR SMALL (A) 09/04/2019 1204   LEUKOCYTESUR Negative 12/07/2013 0133   Sepsis Labs: @LABRCNTIP (procalcitonin:4,lacticidven:4) ) Recent Results (from the past 240 hour(s))  Respiratory Panel by RT PCR (Flu A&B, Covid) - Nasopharyngeal Swab     Status: None   Collection Time: 09/04/19  2:54 PM   Specimen: Nasopharyngeal Swab  Result Value Ref Range Status   SARS Coronavirus 2 by RT PCR NEGATIVE NEGATIVE Final    Comment:  (NOTE) SARS-CoV-2 target nucleic acids are NOT DETECTED. The SARS-CoV-2 RNA is generally detectable in upper respiratoy specimens during the acute phase of infection. The lowest concentration of SARS-CoV-2 viral copies this assay can detect is 131 copies/mL. A negative result does not preclude SARS-Cov-2 infection and should not be used as the sole basis for treatment or other patient management decisions. A negative result may occur with  improper specimen collection/handling, submission of specimen other than nasopharyngeal swab, presence of viral mutation(s) within the areas targeted by this assay, and inadequate number of viral copies (<131 copies/mL). A negative result must be combined with clinical observations, patient history, and epidemiological information. The expected result is Negative. Fact Sheet for Patients:  PinkCheek.be Fact Sheet for Healthcare Providers:  GravelBags.it This test is not yet ap proved or cleared by the [place] FDA and  has been authorized for detection and/or diagnosis of SARS-CoV-2 by FDA under an Emergency Use Authorization (EUA). This EUA will remain  in effect (meaning this test can be used) for the  duration of the COVID-19 declaration under Section 564(b)(1) of the Act, 21 U.S.C. section 360bbb-3(b)(1), unless the authorization is terminated or revoked sooner.    Influenza A by PCR NEGATIVE NEGATIVE Final   Influenza B by PCR NEGATIVE NEGATIVE Final    Comment: (NOTE) The Xpert Xpress SARS-CoV-2/FLU/RSV assay is intended as an aid in  the diagnosis of influenza from Nasopharyngeal swab specimens and  should not be used as a sole basis for treatment. Nasal washings and  aspirates are unacceptable for Xpert Xpress SARS-CoV-2/FLU/RSV  testing. Fact Sheet for Patients: PinkCheek.be Fact Sheet for Healthcare  Providers: GravelBags.it This test is not yet approved or cleared by the [place] FDA and  has been authorized for detection and/or diagnosis of SARS-CoV-2 by  FDA under an Emergency Use Authorization (EUA). This EUA will remain  in effect (meaning this test can be used) for the duration of the  Covid-19 declaration under Section 564(b)(1) of the Act, 21  U.S.C. section 360bbb-3(b)(1), unless the authorization is  terminated or revoked. Performed at [facility], [address], [place], [place] [address]   Urine culture     Status: Abnormal   Collection Time: 09/04/19  3:26 PM   Specimen: Urine, Random  Result Value Ref Range Status   Specimen Description   Final    URINE, RANDOM Performed at [facility], [address] [place], [place] [address]    Special Requests   Final    NONE Performed at [facility], [address] [place], [place] [address]    Culture >=100,000 COLONIES/mL ESCHERICHIA COLI (A)  Final   Report Status 09/07/2019 FINAL  Final   Organism ID, Bacteria ESCHERICHIA COLI (A)  Final      Susceptibility   Escherichia coli - MIC*    AMPICILLIN 8 SENSITIVE Sensitive     CEFAZOLIN <=4 SENSITIVE Sensitive     CEFTRIAXONE <=0.25 SENSITIVE Sensitive     CIPROFLOXACIN <=0.25 SENSITIVE Sensitive     GENTAMICIN <=1 SENSITIVE Sensitive     IMIPENEM <=0.25 SENSITIVE Sensitive     NITROFURANTOIN <=16 SENSITIVE Sensitive     TRIMETH/SULFA <=20 SENSITIVE Sensitive     AMPICILLIN/SULBACTAM 8 SENSITIVE Sensitive     PIP/TAZO <=4 SENSITIVE Sensitive     * >=100,000 COLONIES/mL ESCHERICHIA COLI     Scheduled Meds: . famotidine  20 mg Oral BID  . folic acid  1 mg Oral Daily  . LORazepam  0-4 mg Intravenous Q12H  . multivitamin with minerals  1 tablet Oral Daily  . pantoprazole (PROTONIX) IV  40 mg Intravenous Q12H  . thiamine  100 mg Oral Daily   Or  . thiamine  100 mg Intravenous Daily   Continuous Infusions: . cefTRIAXone (ROCEPHIN)   IV Stopped (09/06/19 1447)    Procedures/Studies: CT Head Wo Contrast  Result Date: 09/04/2019 CLINICAL DATA:  Ataxia, head trauma alcohol withdrawl Patient reports seizure 2 nights ago with fall. EXAM: CT HEAD WITHOUT CONTRAST TECHNIQUE: Contiguous axial images were obtained from the base of the skull through the vertex without intravenous contrast. COMPARISON:  Head CT 09/25/2013 FINDINGS: Brain: No intracranial hemorrhage, mass effect, or midline shift. No hydrocephalus. The basilar cisterns are patent. No evidence of territorial infarct or acute ischemia. No evidence of pontine hypodensity. No extra-axial or intracranial fluid collection. Vascular: No hyperdense vessel or unexpected calcification. Skull: No fracture or focal lesion. Sinuses/Orbits: Paranasal sinuses and mastoid air cells are clear. The visualized orbits are unremarkable. Other: None. IMPRESSION: Unremarkable noncontrast head CT.  Electronically Signed   By: Keith Rake M.D.   On: 09/04/2019 13:53   CT ABDOMEN PELVIS W CONTRAST  Result Date: 09/04/2019 CLINICAL DATA:  Nausea, vomiting EXAM: CT ABDOMEN AND PELVIS WITH CONTRAST TECHNIQUE: Multidetector CT imaging of the abdomen and pelvis was performed using the standard protocol following bolus administration of intravenous contrast. CONTRAST:  39mL OMNIPAQUE IOHEXOL 300 MG/ML  SOLN COMPARISON:  03/31/2019 FINDINGS: Lower chest: Lung bases are clear. No effusions. Heart is normal size. Hepatobiliary: Severe diffuse fatty infiltration of the liver. No focal abnormality. Gallbladder unremarkable. Common bowel duct prominent at 8 mm. No visible ductal stone or mass. This is stable since prior study. Pancreas: No focal abnormality or ductal dilatation. No surrounding inflammation currently. Spleen: No focal abnormality.  Normal size. Adrenals/Urinary Tract: No adrenal abnormality. No focal renal abnormality. No stones or hydronephrosis. Urinary bladder is unremarkable. Stomach/Bowel:  Normal appendix. Stomach, large and small bowel grossly unremarkable. Vascular/Lymphatic: Aortic atherosclerosis. No enlarged abdominal or pelvic lymph nodes. Reproductive: Uterus and left adnexar unremarkable. 2.1 cm fat containing mass in the right ovary compatible with dermoid. Other: No free fluid or free air. Musculoskeletal: Postoperative changes in the lumbar spine. No acute bony abnormality. IMPRESSION: Severe diffuse fatty infiltration of the liver. Stable common bile duct prominence, 8 mm. Stable right ovarian dermoid. Aortic atherosclerosis. No acute findings. Electronically Signed   By: Rolm Baptise M.D.   On: 09/04/2019 22:26   DG Chest Port 1 View  Result Date: 09/04/2019 CLINICAL DATA:  Cough.  Vomiting and diarrhea. EXAM: PORTABLE CHEST 1 VIEW COMPARISON:  06/24/2019 FINDINGS: The heart size and mediastinal contours are within normal limits. Both lungs are clear. The visualized skeletal structures are unremarkable. IMPRESSION: Normal exam. Electronically Signed   By: Lorriane Shire M.D.   On: 09/04/2019 12:59    Orson Eva, DO  [facility] Pager [phone]  If 7PM-7AM, please contact night-coverage [email or web address] Password [email or web address] 09/07/2019, 10:06 AM   LOS: 2 days

## 2019-09-07 NOTE — Plan of Care (Signed)

## 2019-10-25 ENCOUNTER — Other Ambulatory Visit: Payer: Self-pay

## 2019-10-25 ENCOUNTER — Emergency Department (HOSPITAL_COMMUNITY)
Admission: EM | Admit: 2019-10-25 | Discharge: 2019-10-25 | Disposition: A | Discharge status: Home / Self Care | Payer: Self-pay | Attending: Emergency Medicine | Admitting: Emergency Medicine

## 2019-10-25 ENCOUNTER — Emergency Department (HOSPITAL_COMMUNITY): Payer: Self-pay

## 2019-10-25 ENCOUNTER — Encounter (HOSPITAL_COMMUNITY): Payer: Self-pay | Admitting: Radiology

## 2019-10-25 DIAGNOSIS — F1020 Alcohol dependence, uncomplicated: Secondary | ICD-10-CM

## 2019-10-25 DIAGNOSIS — S300XXA Contusion of lower back and pelvis, initial encounter: Secondary | ICD-10-CM | POA: Insufficient documentation

## 2019-10-25 DIAGNOSIS — F17210 Nicotine dependence, cigarettes, uncomplicated: Secondary | ICD-10-CM | POA: Insufficient documentation

## 2019-10-25 DIAGNOSIS — Y9389 Activity, other specified: Secondary | ICD-10-CM | POA: Insufficient documentation

## 2019-10-25 DIAGNOSIS — Z79899 Other long term (current) drug therapy: Secondary | ICD-10-CM | POA: Insufficient documentation

## 2019-10-25 DIAGNOSIS — M79671 Pain in right foot: Secondary | ICD-10-CM | POA: Insufficient documentation

## 2019-10-25 DIAGNOSIS — I10 Essential (primary) hypertension: Secondary | ICD-10-CM | POA: Insufficient documentation

## 2019-10-25 DIAGNOSIS — F10920 Alcohol use, unspecified with intoxication, uncomplicated: Secondary | ICD-10-CM

## 2019-10-25 DIAGNOSIS — F10220 Alcohol dependence with intoxication, uncomplicated: Secondary | ICD-10-CM | POA: Insufficient documentation

## 2019-10-25 DIAGNOSIS — R4182 Altered mental status, unspecified: Secondary | ICD-10-CM | POA: Insufficient documentation

## 2019-10-25 DIAGNOSIS — M79672 Pain in left foot: Secondary | ICD-10-CM | POA: Insufficient documentation

## 2019-10-25 DIAGNOSIS — W01198A Fall on same level from slipping, tripping and stumbling with subsequent striking against other object, initial encounter: Secondary | ICD-10-CM | POA: Insufficient documentation

## 2019-10-25 DIAGNOSIS — W19XXXA Unspecified fall, initial encounter: Secondary | ICD-10-CM

## 2019-10-25 DIAGNOSIS — Y999 Unspecified external cause status: Secondary | ICD-10-CM | POA: Insufficient documentation

## 2019-10-25 DIAGNOSIS — Y9289 Other specified places as the place of occurrence of the external cause: Secondary | ICD-10-CM | POA: Insufficient documentation

## 2019-10-25 LAB — COMPREHENSIVE METABOLIC PANEL
ALT: 114 U/L — ABNORMAL HIGH (ref 0–44)
AST: 222 U/L — ABNORMAL HIGH (ref 15–41)
Albumin: 5.1 g/dL — ABNORMAL HIGH (ref 3.5–5.0)
Alkaline Phosphatase: 94 U/L (ref 38–126)
Anion gap: 14 (ref 5–15)
BUN: 6 mg/dL (ref 6–20)
CO2: 30 mmol/L (ref 22–32)
Calcium: 9.2 mg/dL (ref 8.9–10.3)
Chloride: 105 mmol/L (ref 98–111)
Creatinine, Ser: 0.49 mg/dL (ref 0.44–1.00)
GFR calc Af Amer: >60 mL/min (ref >60)
GFR calc non Af Amer: >60 mL/min (ref >60)
Glucose, Bld: 90 mg/dL (ref 70–99)
Potassium: 3.5 mmol/L (ref 3.5–5.1)
Sodium: 149 mmol/L — ABNORMAL HIGH (ref 135–145)
Total Bilirubin: 0.3 mg/dL (ref 0.3–1.2)
Total Protein: 9.2 g/dL — ABNORMAL HIGH (ref 6.5–8.1)

## 2019-10-25 LAB — RAPID URINE DRUG SCREEN, HOSP PERFORMED
Amphetamines: NOT DETECTED
Barbiturates: NOT DETECTED
Benzodiazepines: NOT DETECTED
Cocaine: NOT DETECTED
Opiates: NOT DETECTED
Tetrahydrocannabinol: POSITIVE — AB

## 2019-10-25 LAB — CBC
HCT: 45.7 % (ref 36.0–46.0)
Hemoglobin: 15.1 g/dL — ABNORMAL HIGH (ref 12.0–15.0)
MCH: 34.7 pg — ABNORMAL HIGH (ref 26.0–34.0)
MCHC: 33 g/dL (ref 30.0–36.0)
MCV: 105.1 fL — ABNORMAL HIGH (ref 80.0–100.0)
Platelets: 115 10*3/uL — ABNORMAL LOW (ref 150–400)
RBC: 4.35 MIL/uL (ref 3.87–5.11)
RDW: 13.6 % (ref 11.5–15.5)
WBC: 6.2 10*3/uL (ref 4.0–10.5)
nRBC: 0 % (ref 0.0–0.2)

## 2019-10-25 LAB — PREGNANCY, URINE: Preg Test, Ur: NEGATIVE

## 2019-10-25 LAB — ETHANOL: Alcohol, Ethyl (B): 485 mg/dL (ref <10)

## 2019-10-25 MED ORDER — ONDANSETRON HCL 4 MG/2ML IJ SOLN
4.0000 mg | Freq: Once | INTRAMUSCULAR | Status: AC
  Administered 2019-10-25: 4 mg via INTRAVENOUS
  Filled 2019-10-25: qty 2

## 2019-10-25 MED ORDER — SODIUM CHLORIDE 0.9 % IV BOLUS
1000.0000 mL | Freq: Once | INTRAVENOUS | Status: AC
  Administered 2019-10-25: 1000 mL via INTRAVENOUS

## 2019-10-25 MED ORDER — LORAZEPAM 2 MG/ML IJ SOLN
1.0000 mg | Freq: Once | INTRAMUSCULAR | Status: AC
  Administered 2019-10-25: 1 mg via INTRAVENOUS
  Filled 2019-10-25: qty 1

## 2019-10-25 NOTE — ED Provider Notes (Signed)
[facility] EMERGENCY DEPARTMENT Provider Note   CSN: [id number] Arrival date & time: 10/25/19  1325     History Chief Complaint  Patient presents with  . Alcohol Intoxication    Olivia Lester is a 44 y.o. female.  Level 5 caveat for alcohol intoxication.  Patient apparently fell into the shrubbery earlier this morning after drinking excessive amount of vodka last night.  She feels shaky and like she is withdrawing.  Past history of alcohol withdrawal seizures.  Review of systems positive for right and left foot pain and lower back pain secondary to fall.  No obvious head or neck trauma.        Past Medical History:  Diagnosis Date  . Alcohol abuse   . Back injury   . Gastric ulcer   . Gastric ulcer 10/14/2013  . Hepatitis C 10/14/2013  . History of kidney stones   . 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 Pancreatitis, acute   . Panic attack   . Pneumonia   . Polysubstance abuse ([place])   . PONV (postoperative nausea and vomiting)   . Seizures [facility])     Patient Active Problem List   Diagnosis Date Noted  . Alcoholic gastritis [id number]  . DTs (delirium tremens) ([place])   . Urinary tract infection without hematuria   . Alcohol intoxication ([place]) 09/04/2019  . Alcohol withdrawal ([place]) 06/24/2019  . Sinus tachycardia 06/24/2019  . Essential hypertension 06/24/2019  . Hypokalemia 03/31/2019  . Cannabis abuse 03/11/2019  . Elevated transaminase level   . Alcohol abuse   . Polysubstance (excluding opioids) dependence, daily use ([place]) 09/13/2018  . Closed displaced fracture of fifth metatarsal bone of right foot 08/02/2018  . Dental abscess 12/25/2017  . Gastric ulcer 10/14/2013  . Transaminitis 10/14/2013  . Tobacco dependence 10/14/2013  . Hepatitis C 10/14/2013  . Thrombocytopenia ([place]) 10/13/2013  . Duodenitis 10/12/2013  . Alcoholism ([place]) 10/12/2013  . Abdominal pain, epigastric 10/12/2013  . ANKLE PAIN 01/01/2008    Past Surgical History:    Procedure Laterality Date  . BACK SURGERY    . BREAST SURGERY    . ESOPHAGOGASTRODUODENOSCOPY (EGD) WITH PROPOFOL N/A 10/14/2013   Procedure: ESOPHAGOGASTRODUODENOSCOPY (EGD) WITH PROPOFOL;  Surgeon: Rogene Houston, MD;  Location: [facility];  Service: Endoscopy;  Laterality: N/A;  . ORIF TOE FRACTURE Right 08/08/2018   Procedure: RIGHT FOOT INTERNAL FIXATION 5TH METATARSAL;  Surgeon: Newt Minion, MD;  Location: [place];  Service: Orthopedics;  Laterality: Right;  . TONSILLECTOMY       OB History    Gravida  7   Para  3   Term  3   Preterm      AB  4   Living  3     SAB  2   TAB  2   Ectopic      Multiple      Live Births  3           Family History  Problem Relation Age of Onset  . Cancer Mother 63       ovarian   . Hypertension Mother   . Stroke Mother   . Early death Father   . Alcohol abuse Father     Social History   Tobacco Use  . Smoking status: Current Every Day Smoker    Packs/day: 1.00    Years: 20.00    Pack years: 20.00    Types: Cigarettes  . Smokeless tobacco: Never  Used  Substance Use Topics  . Alcohol use: Yes    Alcohol/week: 12.0 standard drinks    Types: 12 Cans of beer per week    Comment: daily  . Drug use: Yes    Types: Marijuana, Cocaine, Methamphetamines    Comment: denies current cocaine/meth...current mj    Home Medications Prior to Admission medications   Medication Sig Start Date End Date Taking? Authorizing Provider  amLODipine (NORVASC) 5 MG tablet Take 1 tablet (5 mg total) by mouth daily. 09/07/19   Orson Eva, MD  cefdinir (OMNICEF) 300 MG capsule Take 1 capsule (300 mg total) by mouth every 12 (twelve) hours. 09/07/19   Orson Eva, MD  diphenhydrAMINE (BENADRYL) 25 mg capsule Take 25 mg by mouth daily as needed for allergies.    [provider]  Multiple Vitamin (MULTIVITAMIN WITH MINERALS) TABS tablet Take 1 tablet by mouth every other day.     [provider]    Allergies    Toradol  [ketorolac tromethamine] and Asa [aspirin]  Review of Systems   Review of Systems  Unable to perform ROS: Other    Physical Exam Updated Vital Signs Ht 5\' 7"  (1.702 m)   Wt 55.8 kg   BMI 19.26 kg/m   Physical Exam Vitals and nursing note reviewed.  Constitutional:      Appearance: She is well-developed.     Comments: Appears intoxicated  HENT:     Head: Normocephalic and atraumatic.  Eyes:     Conjunctiva/sclera: Conjunctivae normal.  Cardiovascular:     Rate and Rhythm: Normal rate and regular rhythm.  Pulmonary:     Effort: Pulmonary effort is normal.     Breath sounds: Normal breath sounds.  Abdominal:     General: Bowel sounds are normal.     Palpations: Abdomen is soft.  Musculoskeletal:     Cervical back: Neck supple.     Comments: Tender lumbar spine, lateral aspect left foot, medial/ lateral aspect of right foot.  Skin:    General: Skin is warm and dry.  Neurological:     General: No focal deficit present.     Mental Status: She is oriented to person, place, and time.  Psychiatric:        Behavior: Behavior normal.     ED Results / Procedures / Treatments   Labs (all labs ordered are listed, but only abnormal results are displayed) Labs Reviewed  COMPREHENSIVE METABOLIC PANEL - Abnormal; Notable for the following components:      Result Value   Sodium 149 (*)    Total Protein 9.2 (*)    Albumin 5.1 (*)    AST 222 (*)    ALT 114 (*)    All other components within normal limits  CBC - Abnormal; Notable for the following components:   Hemoglobin 15.1 (*)    MCV 105.1 (*)    MCH 34.7 (*)    Platelets 115 (*)    All other components within normal limits  RAPID URINE DRUG SCREEN, HOSP PERFORMED - Abnormal; Notable for the following components:   Tetrahydrocannabinol POSITIVE (*)    All other components within normal limits  PREGNANCY, URINE  ETHANOL    EKG None  Radiology No results found.  Procedures Procedures (including critical care  time)  Medications Ordered in ED Medications  sodium chloride 0.9 % bolus 1,000 mL (has no administration in time range)  LORazepam (ATIVAN) injection 1 mg (has no administration in time range)  ondansetron (ZOFRAN) injection  4 mg (has no administration in time range)    ED Course  I have reviewed the triage vital signs and the nursing notes.  Pertinent labs & imaging results that were available during my care of the patient were reviewed by me and considered in my medical decision making (see chart for details).    MDM Rules/Calculators/A&P                      Patient apparently was intoxicated and fell into the bushes.  She feels shaky from alcohol withdrawal.  Will obtain alcohol level, drug screen, CT head, plain films of lumbar spine, right foot, left foot.  Intravenous Ativan administered.  1500:  Disc c Dr Roderic Palau Final Clinical Impression(s) / ED Diagnoses Final diagnoses:  Fall, initial encounter  Alcoholism [facility])  Right foot pain  Left foot pain  Lumbar contusion, initial encounter    Rx / DC Orders ED Discharge Orders    None       Nat Christen, MD 10/25/19 1440

## 2019-10-25 NOTE — Discharge Instructions (Signed)
Take Tylenol or Motrin for pain.  Follow-up with [facility] to help you stop drinking

## 2019-10-25 NOTE — ED Notes (Signed)
Pt up to bathroom.

## 2019-10-25 NOTE — ED Triage Notes (Signed)
Patient presents to the ED by [facility] due to alcohol intoxication and withdrawal and a fall into shrubbery. Patient has an abrasion to lower back.  Patient states her last drink was a half gallon of vodka this morning at 0400.  Patient states her usual drinking includes 18 pack a beer a day or 1/2 vodka a day.  EMS reports finding patient into shrubbery after patient had fallen and called 911.  Patient is shaky in triage.

## 2019-10-25 NOTE — ED Notes (Signed)
Date and time results received: 10/25/19 1438 (use smartphrase ".now" to insert current time)  Test: alcohol Critical Value: 485  Name of Provider Notified: Dr. Lacinda Axon  Orders Received? Or Actions Taken?: no/na

## 2019-10-28 ENCOUNTER — Other Ambulatory Visit: Payer: Self-pay

## 2019-10-28 ENCOUNTER — Encounter (HOSPITAL_COMMUNITY): Payer: Self-pay

## 2019-10-28 ENCOUNTER — Observation Stay (HOSPITAL_COMMUNITY)
Admission: EM | Admit: 2019-10-28 | Discharge: 2019-10-28 | Disposition: A | Discharge status: Home / Self Care | Payer: Self-pay | Attending: Family Medicine | Admitting: Family Medicine

## 2019-10-28 ENCOUNTER — Emergency Department (HOSPITAL_COMMUNITY): Payer: Self-pay

## 2019-10-28 DIAGNOSIS — F1210 Cannabis abuse, uncomplicated: Secondary | ICD-10-CM | POA: Insufficient documentation

## 2019-10-28 DIAGNOSIS — E876 Hypokalemia: Principal | ICD-10-CM | POA: Insufficient documentation

## 2019-10-28 DIAGNOSIS — I10 Essential (primary) hypertension: Secondary | ICD-10-CM | POA: Insufficient documentation

## 2019-10-28 DIAGNOSIS — F10929 Alcohol use, unspecified with intoxication, unspecified: Secondary | ICD-10-CM | POA: Diagnosis present

## 2019-10-28 DIAGNOSIS — F1910 Other psychoactive substance abuse, uncomplicated: Secondary | ICD-10-CM | POA: Diagnosis present

## 2019-10-28 DIAGNOSIS — F1010 Alcohol abuse, uncomplicated: Secondary | ICD-10-CM | POA: Diagnosis present

## 2019-10-28 DIAGNOSIS — K529 Noninfective gastroenteritis and colitis, unspecified: Secondary | ICD-10-CM | POA: Insufficient documentation

## 2019-10-28 DIAGNOSIS — B1920 Unspecified viral hepatitis C without hepatic coma: Secondary | ICD-10-CM | POA: Insufficient documentation

## 2019-10-28 DIAGNOSIS — Z20822 Contact with and (suspected) exposure to covid-19: Secondary | ICD-10-CM | POA: Insufficient documentation

## 2019-10-28 DIAGNOSIS — F17200 Nicotine dependence, unspecified, uncomplicated: Secondary | ICD-10-CM | POA: Diagnosis present

## 2019-10-28 DIAGNOSIS — F10229 Alcohol dependence with intoxication, unspecified: Secondary | ICD-10-CM | POA: Insufficient documentation

## 2019-10-28 DIAGNOSIS — Z9114 Patient's other noncompliance with medication regimen: Secondary | ICD-10-CM | POA: Insufficient documentation

## 2019-10-28 DIAGNOSIS — E872 Acidosis: Secondary | ICD-10-CM | POA: Insufficient documentation

## 2019-10-28 DIAGNOSIS — D696 Thrombocytopenia, unspecified: Secondary | ICD-10-CM | POA: Diagnosis present

## 2019-10-28 DIAGNOSIS — K7010 Alcoholic hepatitis without ascites: Secondary | ICD-10-CM | POA: Insufficient documentation

## 2019-10-28 DIAGNOSIS — F10288 Alcohol dependence with other alcohol-induced disorder: Secondary | ICD-10-CM | POA: Insufficient documentation

## 2019-10-28 DIAGNOSIS — K2920 Alcoholic gastritis without bleeding: Secondary | ICD-10-CM | POA: Insufficient documentation

## 2019-10-28 DIAGNOSIS — Y906 Blood alcohol level of 120-199 mg/100 ml: Secondary | ICD-10-CM | POA: Insufficient documentation

## 2019-10-28 DIAGNOSIS — E8729 Other acidosis: Secondary | ICD-10-CM

## 2019-10-28 DIAGNOSIS — F1310 Sedative, hypnotic or anxiolytic abuse, uncomplicated: Secondary | ICD-10-CM | POA: Insufficient documentation

## 2019-10-28 DIAGNOSIS — F17210 Nicotine dependence, cigarettes, uncomplicated: Secondary | ICD-10-CM | POA: Insufficient documentation

## 2019-10-28 DIAGNOSIS — D6959 Other secondary thrombocytopenia: Secondary | ICD-10-CM | POA: Insufficient documentation

## 2019-10-28 LAB — CBC WITH DIFFERENTIAL/PLATELET
Abs Immature Granulocytes: 0.05 10*3/uL (ref 0.00–0.07)
Basophils Absolute: 0 10*3/uL (ref 0.0–0.1)
Basophils Relative: 0 %
Eosinophils Absolute: 0 10*3/uL (ref 0.0–0.5)
Eosinophils Relative: 0 %
HCT: 36.9 % (ref 36.0–46.0)
Hemoglobin: 12.5 g/dL (ref 12.0–15.0)
Immature Granulocytes: 1 %
Lymphocytes Relative: 4 %
Lymphs Abs: 0.5 10*3/uL — ABNORMAL LOW (ref 0.7–4.0)
MCH: 35.2 pg — ABNORMAL HIGH (ref 26.0–34.0)
MCHC: 33.9 g/dL (ref 30.0–36.0)
MCV: 103.9 fL — ABNORMAL HIGH (ref 80.0–100.0)
Monocytes Absolute: 0.5 10*3/uL (ref 0.1–1.0)
Monocytes Relative: 5 %
Neutro Abs: 9.3 10*3/uL — ABNORMAL HIGH (ref 1.7–7.7)
Neutrophils Relative %: 90 %
Platelets: 97 10*3/uL — ABNORMAL LOW (ref 150–400)
RBC: 3.55 MIL/uL — ABNORMAL LOW (ref 3.87–5.11)
RDW: 13.4 % (ref 11.5–15.5)
WBC: 10.3 10*3/uL (ref 4.0–10.5)
nRBC: 0 % (ref 0.0–0.2)

## 2019-10-28 LAB — URINALYSIS, ROUTINE W REFLEX MICROSCOPIC
Bacteria, UA: NONE SEEN
Bilirubin Urine: NEGATIVE
Glucose, UA: >=500 mg/dL — AB
Hgb urine dipstick: NEGATIVE
Ketones, ur: 20 mg/dL — AB
Leukocytes,Ua: NEGATIVE
Nitrite: NEGATIVE
Protein, ur: NEGATIVE mg/dL
Specific Gravity, Urine: 1.01 (ref 1.005–1.030)
pH: 7 (ref 5.0–8.0)

## 2019-10-28 LAB — COMPREHENSIVE METABOLIC PANEL
ALT: 87 U/L — ABNORMAL HIGH (ref 0–44)
AST: 178 U/L — ABNORMAL HIGH (ref 15–41)
Albumin: 4.5 g/dL (ref 3.5–5.0)
Alkaline Phosphatase: 77 U/L (ref 38–126)
Anion gap: 20 — ABNORMAL HIGH (ref 5–15)
BUN: 6 mg/dL (ref 6–20)
CO2: 24 mmol/L (ref 22–32)
Calcium: 8.6 mg/dL — ABNORMAL LOW (ref 8.9–10.3)
Chloride: 95 mmol/L — ABNORMAL LOW (ref 98–111)
Creatinine, Ser: 0.52 mg/dL (ref 0.44–1.00)
GFR calc Af Amer: >60 mL/min (ref >60)
GFR calc non Af Amer: >60 mL/min (ref >60)
Glucose, Bld: 79 mg/dL (ref 70–99)
Potassium: 2.7 mmol/L — CL (ref 3.5–5.1)
Sodium: 139 mmol/L (ref 135–145)
Total Bilirubin: 1 mg/dL (ref 0.3–1.2)
Total Protein: 7.5 g/dL (ref 6.5–8.1)

## 2019-10-28 LAB — BASIC METABOLIC PANEL
Anion gap: 9 (ref 5–15)
BUN: <5 mg/dL — ABNORMAL LOW (ref 6–20)
CO2: 23 mmol/L (ref 22–32)
Calcium: 7.4 mg/dL — ABNORMAL LOW (ref 8.9–10.3)
Chloride: 99 mmol/L (ref 98–111)
Creatinine, Ser: 0.5 mg/dL (ref 0.44–1.00)
GFR calc Af Amer: >60 mL/min (ref >60)
GFR calc non Af Amer: >60 mL/min (ref >60)
Glucose, Bld: 210 mg/dL — ABNORMAL HIGH (ref 70–99)
Potassium: 3.8 mmol/L (ref 3.5–5.1)
Sodium: 131 mmol/L — ABNORMAL LOW (ref 135–145)

## 2019-10-28 LAB — MAGNESIUM
Magnesium: 0.9 mg/dL — CL (ref 1.7–2.4)
Magnesium: 3 mg/dL — ABNORMAL HIGH (ref 1.7–2.4)

## 2019-10-28 LAB — PREGNANCY, URINE: Preg Test, Ur: NEGATIVE

## 2019-10-28 LAB — RAPID URINE DRUG SCREEN, HOSP PERFORMED
Amphetamines: NOT DETECTED
Barbiturates: NOT DETECTED
Benzodiazepines: POSITIVE — AB
Cocaine: NOT DETECTED
Opiates: NOT DETECTED
Tetrahydrocannabinol: POSITIVE — AB

## 2019-10-28 LAB — LIPASE, BLOOD: Lipase: 69 U/L — ABNORMAL HIGH (ref 11–51)

## 2019-10-28 LAB — ETHANOL: Alcohol, Ethyl (B): 187 mg/dL — ABNORMAL HIGH (ref <10)

## 2019-10-28 MED ORDER — THIAMINE HCL 100 MG PO TABS: 100.0000 mg | ORAL_TABLET | Freq: Every day | ORAL | 1 refills | Status: DC

## 2019-10-28 MED ORDER — LORAZEPAM 2 MG/ML IJ SOLN: 0.0000 mg | Freq: Four times a day (QID) | INTRAMUSCULAR | Status: DC

## 2019-10-28 MED ORDER — POTASSIUM CHLORIDE 10 MEQ/100ML IV SOLN
10.0000 meq | INTRAVENOUS | Status: AC
  Administered 2019-10-28 (×4): 10 meq via INTRAVENOUS
  Filled 2019-10-28 (×4): qty 100

## 2019-10-28 MED ORDER — POTASSIUM CHLORIDE 10 MEQ/100ML IV SOLN
10.0000 meq | Freq: Once | INTRAVENOUS | Status: AC
  Administered 2019-10-28: 10 meq via INTRAVENOUS
  Filled 2019-10-28: qty 100

## 2019-10-28 MED ORDER — MAGNESIUM SULFATE 2 GM/50ML IV SOLN
2.0000 g | Freq: Once | INTRAVENOUS | Status: AC
  Administered 2019-10-28: 11:00:00 2 g via INTRAVENOUS
  Filled 2019-10-28: qty 50

## 2019-10-28 MED ORDER — POTASSIUM CHLORIDE CRYS ER 20 MEQ PO TBCR
40.0000 meq | EXTENDED_RELEASE_TABLET | Freq: Once | ORAL | Status: AC
  Administered 2019-10-28: 40 meq via ORAL
  Filled 2019-10-28: qty 2

## 2019-10-28 MED ORDER — THIAMINE HCL 100 MG/ML IJ SOLN: 100.0000 mg | Freq: Every day | INTRAMUSCULAR | Status: DC

## 2019-10-28 MED ORDER — LORAZEPAM 2 MG/ML IJ SOLN
1.0000 mg | INTRAMUSCULAR | Status: DC | PRN
  Administered 2019-10-28 (×2): 1 mg via INTRAVENOUS
  Filled 2019-10-28 (×2): qty 1

## 2019-10-28 MED ORDER — PANTOPRAZOLE SODIUM 40 MG PO TBEC: 40.0000 mg | DELAYED_RELEASE_TABLET | Freq: Every day | ORAL | 5 refills | Status: DC

## 2019-10-28 MED ORDER — PANCRELIPASE (LIP-PROT-AMYL) 12000-38000 UNITS PO CPEP
24000.0000 [IU] | ORAL_CAPSULE | Freq: Three times a day (TID) | ORAL | Status: DC
  Filled 2019-10-28 (×6): qty 2

## 2019-10-28 MED ORDER — SODIUM CHLORIDE 0.9 % IV BOLUS
1000.0000 mL | Freq: Once | INTRAVENOUS | Status: AC
  Administered 2019-10-28: 10:00:00 1000 mL via INTRAVENOUS

## 2019-10-28 MED ORDER — LORAZEPAM 2 MG/ML IJ SOLN
1.0000 mg | Freq: Once | INTRAMUSCULAR | Status: AC
  Administered 2019-10-28: 10:00:00 1 mg via INTRAVENOUS
  Filled 2019-10-28: qty 1

## 2019-10-28 MED ORDER — FOLIC ACID 1 MG PO TABS: 1.0000 mg | ORAL_TABLET | Freq: Every day | ORAL | Status: DC

## 2019-10-28 MED ORDER — DEXTROSE 50 % IV SOLN
1.0000 | Freq: Once | INTRAVENOUS | Status: AC
  Administered 2019-10-28: 14:00:00 50 mL via INTRAVENOUS
  Filled 2019-10-28: qty 50

## 2019-10-28 MED ORDER — PANCRELIPASE (LIP-PROT-AMYL) 24000-76000 UNITS PO CPEP: 24000.0000 [IU] | ORAL_CAPSULE | Freq: Three times a day (TID) | ORAL | 5 refills | Status: DC

## 2019-10-28 MED ORDER — LORAZEPAM 1 MG PO TABS: 1.0000 mg | ORAL_TABLET | ORAL | Status: DC | PRN

## 2019-10-28 MED ORDER — ONDANSETRON HCL 4 MG/2ML IJ SOLN: 4.0000 mg | Freq: Once | INTRAMUSCULAR | Status: DC

## 2019-10-28 MED ORDER — AMOXICILLIN-POT CLAVULANATE 875-125 MG PO TABS
1.0000 | ORAL_TABLET | Freq: Two times a day (BID) | ORAL | Status: DC
  Administered 2019-10-28: 18:00:00 1 via ORAL
  Filled 2019-10-28: qty 1

## 2019-10-28 MED ORDER — FOLIC ACID 1 MG PO TABS: 1.0000 mg | ORAL_TABLET | Freq: Every day | ORAL | 1 refills | Status: DC

## 2019-10-28 MED ORDER — LABETALOL HCL 5 MG/ML IV SOLN: 10.0000 mg | INTRAVENOUS | Status: DC | PRN

## 2019-10-28 MED ORDER — MAGNESIUM SULFATE 4 GM/100ML IV SOLN
4.0000 g | Freq: Once | INTRAVENOUS | Status: AC
  Administered 2019-10-28: 4 g via INTRAVENOUS
  Filled 2019-10-28: qty 100

## 2019-10-28 MED ORDER — ONDANSETRON HCL 4 MG/2ML IJ SOLN
INTRAMUSCULAR | Status: AC
  Administered 2019-10-28: 4 mg
  Filled 2019-10-28: qty 2

## 2019-10-28 MED ORDER — THIAMINE HCL 100 MG PO TABS
100.0000 mg | ORAL_TABLET | Freq: Once | ORAL | Status: AC
  Administered 2019-10-28: 100 mg via ORAL
  Filled 2019-10-28: qty 1

## 2019-10-28 MED ORDER — AMOXICILLIN-POT CLAVULANATE 875-125 MG PO TABS: 1.0000 | ORAL_TABLET | Freq: Two times a day (BID) | ORAL | 0 refills | Status: AC

## 2019-10-28 MED ORDER — LORAZEPAM 2 MG/ML IJ SOLN
1.0000 mg | Freq: Once | INTRAMUSCULAR | Status: AC
  Administered 2019-10-28: 14:00:00 1 mg via INTRAVENOUS
  Filled 2019-10-28: qty 1

## 2019-10-28 MED ORDER — DEXTROSE-NACL 5-0.45 % IV SOLN: INTRAVENOUS | Status: AC

## 2019-10-28 MED ORDER — AMLODIPINE BESYLATE 5 MG PO TABS: 5.0000 mg | ORAL_TABLET | Freq: Every day | ORAL | 5 refills | Status: DC

## 2019-10-28 MED ORDER — ADULT MULTIVITAMIN W/MINERALS CH: 1.0000 | ORAL_TABLET | Freq: Every day | ORAL | Status: DC

## 2019-10-28 MED ORDER — POTASSIUM CHLORIDE CRYS ER 20 MEQ PO TBCR
40.0000 meq | EXTENDED_RELEASE_TABLET | ORAL | Status: DC
  Administered 2019-10-28: 18:00:00 40 meq via ORAL
  Filled 2019-10-28: qty 2

## 2019-10-28 MED ORDER — THIAMINE HCL 100 MG PO TABS: 100.0000 mg | ORAL_TABLET | Freq: Every day | ORAL | Status: DC

## 2019-10-28 MED ORDER — ONDANSETRON HCL 4 MG/2ML IJ SOLN
4.0000 mg | Freq: Once | INTRAMUSCULAR | Status: AC
  Administered 2019-10-28: 4 mg via INTRAVENOUS
  Filled 2019-10-28: qty 2

## 2019-10-28 MED ORDER — PANTOPRAZOLE SODIUM 40 MG PO TBEC: 40.0000 mg | DELAYED_RELEASE_TABLET | Freq: Every day | ORAL | Status: DC

## 2019-10-28 MED ORDER — IOHEXOL 300 MG/ML  SOLN
100.0000 mL | Freq: Once | INTRAMUSCULAR | Status: AC | PRN
  Administered 2019-10-28: 15:00:00 100 mL via INTRAVENOUS

## 2019-10-28 MED ORDER — LORAZEPAM 2 MG/ML IJ SOLN: 0.0000 mg | Freq: Two times a day (BID) | INTRAMUSCULAR | Status: DC

## 2019-10-28 NOTE — ED Notes (Signed)
Error in discharge position. Patient discharged home by hospitalist.

## 2019-10-28 NOTE — ED Notes (Signed)
Patient discharged home. Discharge instructions given and reviewed. Patient transported home with her mother POV. Prescriptions reviewed.

## 2019-10-28 NOTE — Progress Notes (Signed)
Patient brought in the ED today due to alchohol intoxication. Patient has 6 ED visits within the past 6 months which has triggered a CSW visit.  CSW attempting to follow up at present time  [place] Worker  Ph: [phone]

## 2019-10-28 NOTE — ED Notes (Addendum)
Date and time results received: 10/28/19 10:36 AM  (use smartphrase ".now" to insert current time)  Test: potassium Critical Value: 2.7  Name of Provider Notified: [place]  Orders Received? Or Actions Taken?:

## 2019-10-28 NOTE — Discharge Instructions (Signed)
1) as discussed with you and your mother you have been given information on how to access alcohol on substance abuse programs--- you strongly advised to get help with your alcohol and polysubstance abuse problems 2) please take medications as prescribed--- 3) strongly  advised to quit smoking both tobacco and marijuana  4)Avoid ibuprofen/Advil/Aleve/Motrin/[place]y Powders/Naproxen/BC powders/Meloxicam/Diclofenac/Indomethacin and other Nonsteroidal anti-inflammatory medications as these will make you more likely to bleed and can cause stomach ulcers, can also cause Kidney problems.

## 2019-10-28 NOTE — Progress Notes (Signed)
CSW at bedside to address the issue of alcohol abuse. Patient states that she drinks alcohol daily. CSW inquired if patient has ever received treatment in the past and also inquired about her longest sober period. Patient states that she has received treatment from a [facility] once before when she was incarcerated. Patient states that her longest sobriety period was 2 years. CSW asked if patient was interested in getting sober from alcohol. Patient shook her head yes. CSW inquired about stressors or situations that trigger patients alcohol use. CSW and patient conversed briefly around her triggers. Patient CSW provided patient with a list outpatient/inpatient substance use and alcohol treatment centers. CSW asked patient if there was anything else that could be done at this time besides the list. Patient responded, "no not at this time".  No further needs at this time  [place] Worker  Ph: [phone]

## 2019-10-28 NOTE — ED Triage Notes (Signed)
Pt presents with complaints similar to visit on 10/25/2019. States she is shaky, has N/V/D and blurred vision. C/O stomach, chest and back pain.   Pt states she has had another fall since last visit and c/o of worsened back pain since fall.   Pt alert and oriented at this time. States she last had a drink at 2000 last night and drinks 1/5 of liquor to 1/2 gallon or 18 beers per day.

## 2019-10-28 NOTE — Consult Note (Signed)
Olivia Lester Demographics:    Olivia Lester, is a 44 y.o. female  MRN: [id number]   DOB - [birth year]  Admit Date - 10/28/2019  Outpatient Primary MD for the Olivia Lester is Olivia Lester, No Pcp Per   Assessment & Plan:    Principal Problem:   Hypomagnesemia Active Problems:   Alcohol abuse   Hypokalemia   Polysubstance abuse (HCC)   Thrombocytopenia (HCC)   Tobacco dependence   Hepatitis C   Cannabis abuse   Essential hypertension   Alcohol intoxication ([place])   Colitis   Alcoholic hepatitis without ascites    Brief Summary: 44 year old female with past medical history relevant for EtOH abuse, tobacco abuse, and THC, and benzo abuse as well as HTN,  history of hep C, anxiety disorder, status post prior ORIF of right ankle, history of alcoholic esophageal gastro duodenitis and chronic alcohol induced thrombocytopenia Olivia Lester reports typically drinking an 18 pack or half a gallon of liquor daily--- States she last had a etoh drink at 2000 last night --- its been a long time since Olivia Lester went a couple of days without drinking-- --Olivia Lester is not interested in alcohol rehab or drug rehab -Okay to discharge home after replacement of electrolytes   A/p 1)Severe Hypokalemia and Severe Hypomagnesiemia--- suspect due to GI losses in an alcoholic female with some degree of possible pancreatic insufficiency and chronic diarrhea Mag is 0.9, K is 2.7 --After initial replacement magnesium is up to 3.0 potassium is up to 3.8 - Replaced iv and po -Bicarb initially was 24 with anion gap of 20 -Repeat bicarb is 23 anion gap is down to 9 prior to additional IV fluids  2)Abdominal pain and Nausea--- -Secondary to alcoholic induced gastritis, possible Colitis -Lipase 69 -pt also likely has component of cannabis hyperemesis syndrome CT  Abd suggest Possible Colitis-- "soft" diagnosis - - Olivia Lester is afebrile, no leukocytosis,  --- No change in her usual stool pattern -will treat empirically with Augmentin, avoid Flagyl due to Etoh use CT Abd suggest Rt Sided PyeloNephritis-- no UTI sxs, UA is not suggestive of UTI --Urine cx in February 2021 grew E.coli--  3)Polysubstance Abuse -UDS positive for Benzo, THC --Pt uses Tobacco, Benzo, Alcohol, opioids, marijuana --Olivia Lester declines rehab please see #5 below  4)Tobacco abuse - use Nicotine patch  5)Alcohol Abuse with alcoholic hepatitis --- AST 178, , ALT is 87,, BAL is 187, give MVI --Education officer, museum visited area with Olivia Lester and gave her resources for rehab -Olivia Lester tells me and her mother that she is not ready to go to rehab --Olivia Lester states she is not really ready to quit drinking now -She lives with her significant other who drinks heavily too -Despite persuasion from Olivia Lester's mother she is not ready to quit drinking -MCV 103.9  6)Loose stools -likely due to pancreatic insufficiency -no leukocytosis no fevers -Okay to use Creon  7)Chronic Thrombocytopenia -Platelets is 97,  --from EtOH abuse,   8)Hypertension -not compliant with PTA Amlodipine -  Compliance with amlodipine advised  Disposition:- --Olivia Lester declines rehab/help with alcohol and polysubstance abuse despite persuasion from myself and Olivia Lester's mother- Okay to discharge home -1) as discussed with you and your mother you have been given information on how to access alcohol on substance abuse programs--- you strongly advised to get help with your alcohol and polysubstance abuse problems 2) please take medications as prescribed--- 3) strongly  advised to quit smoking both tobacco and marijuana  4)Avoid ibuprofen/Advil/Aleve/Motrin/Goody Powders/Naproxen/BC powders/Meloxicam/Diclofenac/Indomethacin and other Nonsteroidal anti-inflammatory medications as these will make you more likely to bleed and  can cause stomach ulcers, can also cause Kidney problems.    With History of - Reviewed by me  Past Medical History:  Diagnosis Date  . Alcohol abuse   . Back injury   . Gastric ulcer   . Gastric ulcer 10/14/2013  . Hepatitis C 10/14/2013  . History of kidney stones   . 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 Pancreatitis, acute   . Panic attack   . Pneumonia   . Polysubstance abuse ([place])   . PONV (postoperative nausea and vomiting)   . Seizures ([place])       Past Surgical History:  Procedure Laterality Date  . BACK SURGERY    . BREAST SURGERY    . ESOPHAGOGASTRODUODENOSCOPY (EGD) WITH PROPOFOL N/A 10/14/2013   Procedure: ESOPHAGOGASTRODUODENOSCOPY (EGD) WITH PROPOFOL;  Surgeon: Rogene Houston, MD;  Location: [facility];  Service: Endoscopy;  Laterality: N/A;  . ORIF TOE FRACTURE Right 08/08/2018   Procedure: RIGHT FOOT INTERNAL FIXATION 5TH METATARSAL;  Surgeon: Newt Minion, MD;  Location: [facility];  Service: Orthopedics;  Laterality: Right;  . TONSILLECTOMY      Chief Complaint  Olivia Lester presents with  . Alcohol Intoxication      HPI:    Olivia Lester  is a 44 y.o. female with past medical history relevant for EtOH abuse, tobacco abuse, and THC, and benzo abuse as well as hypertension, history of hep C, anxiety disorder, status post prior ORIF of right ankle, history of alcoholic esophageal gastro duodenitis and chronic alcohol induced thrombocytopenia presents to the ED with multiple complaints similar to the visit of on 10/25/2019. C/O stomach, chest and back pain.   -Olivia Lester has chronic diarrhea apparently due to pancreatic insufficiency -Recurrent episodes of nonbilious emesis at home without blood --Olivia Lester specifically denies bile or blood in emesis, Olivia Lester denies blood or mucus in stools --Blood alcohol level is 184 Olivia Lester reports recurrent falls at home  States she last had a drink at 2000 last night and drinks 1/2 gallon  of liquor or 18 beers per day-I---  its been a long time since Olivia Lester went a couple of days without drinking  - Abdominal pain located in epigastric area, does not radiate, is constant. No fever  Or chills  -Additional history obtained from Olivia Lester's mother at bedside  --Social worker visited area with Olivia Lester and gave her resources for rehab  -Olivia Lester tells me and her mother that she is not ready to go to rehab -Additional history obtained from Olivia Lester's mother at bedside  --Olivia Lester states she is not really ready to quit drinking now -She lives with her significant other who drinks heavily too  -Despite persuasion from Olivia Lester's mother she is not ready to quit drinking  In ED CT abdomen and pelvis suggestive of possible right-sided pyelonephritis, however Olivia Lester has no UTI symptoms and her UA is not suggestive of UTI  -- In ED CT  abdomen and pelvis also suggestive of possible colitis--- Olivia Lester is afebrile, no leukocytosis,  --- No change in her usual stool pattern  - --CT head without acute findings   --Please see disposition in assessment and plan section above   Review of systems:    In addition to the HPI above,   A full Review of  Systems was done, all other systems reviewed are negative except as noted above in HPI , .    Social History:  Reviewed by me    Social History   Tobacco Use  . Smoking status: Current Every Day Smoker    Packs/day: 1.00    Years: 20.00    Pack years: 20.00    Types: Cigarettes  . Smokeless tobacco: Never Used  Substance Use Topics  . Alcohol use: Yes    Alcohol/week: 18.0 standard drinks    Types: 18 Cans of beer per week    Comment: 1/5 -1/2 gallon of liqour daily or 18 beers daily     Family History :  Reviewed by me    Family History  Problem Relation Age of Onset  . Cancer Mother 78       ovarian   . Hypertension Mother   . Stroke Mother   . Early death Father   . Alcohol abuse Father      Home Medications:   Prior to Admission medications     Medication Sig Start Date End Date Taking? Authorizing Provider  amLODipine (NORVASC) 5 MG tablet Take 1 tablet (5 mg total) by mouth daily. 09/07/19  Yes Tat, Shanon Brow, MD  diphenhydrAMINE (BENADRYL) 25 mg capsule Take 25 mg by mouth daily as needed for allergies.   Yes [provider]  Multiple Vitamin (MULTIVITAMIN WITH MINERALS) TABS tablet Take 1 tablet by mouth every other day.    Yes [provider]  cefdinir (OMNICEF) 300 MG capsule Take 1 capsule (300 mg total) by mouth every 12 (twelve) hours. Olivia Lester not taking: Reported on 10/28/2019 09/07/19   Orson Eva, MD     Allergies:     Allergies  Allergen Reactions  . Toradol [Ketorolac Tromethamine] Other (See Comments)    Olivia Lester advised not to take due to ulcers  . Asa [Aspirin] Rash     Physical Exam:   Vitals  Blood pressure 124/90, pulse 75, temperature 99.2 F (37.3 C), temperature source Oral, resp. rate 16, height 5\' 6"  (1.676 m), weight 54.4 kg, SpO2 99 %.  Physical Examination: General appearance - alert, well appearing, and in no distress  Mental status - alert, oriented to person, place, and time, , calm after IV lorazepa Eyes - sclera anicteric Neck - supple, no JVD elevation , Chest - clear  to auscultation bilaterally, symmetrical air movement,  Heart - S1 and S2 normal, regular  Abdomen - soft,  nondistended, mild generalized abdominal tenderness worse in the epigastric area, no rebound or guarding, no CVA area tenderness Neurological - screening mental status exam normal, neck supple without rigidity, cranial nerves II through XII intact, DTR's normal and symmetric Extremities - no pedal edema noted, intact peripheral pulses  Skin - warm, dry    Data Review:    CBC Recent Labs  Lab 10/25/19 1354 10/28/19 0959  WBC 6.2 10.3  HGB 15.1* 12.5  HCT 45.7 36.9  PLT 115* 97*  MCV 105.1* 103.9*  MCH 34.7* 35.2*  MCHC 33.0 33.9  RDW 13.6 13.4  LYMPHSABS  --  0.5*  MONOABS  --  0.5  EOSABS   --  0.0  BASOSABS  --  0.0    Chemistries  Recent Labs  Lab 10/25/19 1354 10/28/19 0959 10/28/19 2007  NA 149* 139 131*  K 3.5 2.7* 3.8  CL 105 95* 99  CO2 30 24 23   GLUCOSE 90 79 210*  BUN 6 6 <5*  CREATININE 0.49 0.52 0.50  CALCIUM 9.2 8.6* 7.4*  MG  --  0.9* 3.0*  AST 222* 178*  --   ALT 114* 87*  --   ALKPHOS 94 77  --   BILITOT 0.3 1.0  --    ------------------------------------------------------------------------------------------------------------------ estimated creatinine clearance is 77.9 mL/min (by C-G formula based on SCr of 0.5 mg/dL). ------------------------------------------------------------------------------------------------------------------ No results for input(s): TSH, T4TOTAL, T3FREE, THYROIDAB in the last 72 hours.  Invalid input(s): FREET3   Coagulation profile No results for input(s): INR, PROTIME in the last 168 hours. ------------------------------------------------------------------------------------------------------------------- No results for input(s): DDIMER in the last 72 hours. -------------------------------------------------------------------------------------------------------------------  Cardiac Enzymes No results for input(s): CKMB, TROPONINI, MYOGLOBIN in the last 168 hours.  Invalid input(s): CK ------------------------------------------------------------------------------------------------------------------ No results found for: BNP   Urinalysis    Component Value Date/Time   COLORURINE YELLOW 10/28/2019 1433   APPEARANCEUR CLEAR 10/28/2019 1433   APPEARANCEUR Clear 12/07/2013 0133   LABSPEC 1.010 10/28/2019 1433   LABSPEC 1.006 12/07/2013 0133   PHURINE 7.0 10/28/2019 1433   GLUCOSEU >=500 (A) 10/28/2019 1433   GLUCOSEU Negative 12/07/2013 0133   HGBUR NEGATIVE 10/28/2019 1433   BILIRUBINUR NEGATIVE 10/28/2019 1433   BILIRUBINUR Negative 12/07/2013 0133   KETONESUR 20 (A) 10/28/2019 1433   PROTEINUR NEGATIVE  10/28/2019 1433   UROBILINOGEN 0.2 04/28/2015 1559   NITRITE NEGATIVE 10/28/2019 1433   LEUKOCYTESUR NEGATIVE 10/28/2019 1433   LEUKOCYTESUR Negative 12/07/2013 0133    ----------------------------------------------------------------------------------------------------------------   Imaging Results:    CT Head Wo Contrast  Result Date: 10/28/2019 CLINICAL DATA:  Headache EXAM: CT HEAD WITHOUT CONTRAST TECHNIQUE: Contiguous axial images were obtained from the base of the skull through the vertex without intravenous contrast. COMPARISON:  10/25/2019 FINDINGS: Brain: No acute intracranial abnormality. Specifically, no hemorrhage, hydrocephalus, mass lesion, acute infarction, or significant intracranial injury. Vascular: No hyperdense vessel or unexpected calcification. Skull: No acute calvarial abnormality. Sinuses/Orbits: Visualized paranasal sinuses and mastoids clear. Orbital soft tissues unremarkable. Other: None IMPRESSION: No acute intracranial abnormality. Electronically Signed   By: Rolm Baptise M.D.   On: 10/28/2019 15:42   CT ABDOMEN PELVIS W CONTRAST  Result Date: 10/28/2019 CLINICAL DATA:  44 year old female with abdominal pain. History of chronic alcoholism and pancreatitis. Concern for infection or abscess. EXAM: CT ABDOMEN AND PELVIS WITH CONTRAST TECHNIQUE: Multidetector CT imaging of the abdomen and pelvis was performed using the standard protocol following bolus administration of intravenous contrast. CONTRAST:  124mL OMNIPAQUE IOHEXOL 300 MG/ML  SOLN COMPARISON:  CT abdomen pelvis dated 09/04/2019. FINDINGS: Lower chest: The visualized lung bases are clear. No intra-abdominal free air or free fluid. Hepatobiliary: Severe fatty infiltration of the liver. No intrahepatic biliary ductal dilatation. Probable sludge within the gallbladder. No calcified stone or evidence of acute cholecystitis by CT. Pancreas: Unremarkable. No pancreatic ductal dilatation or surrounding inflammatory changes.  Spleen: Normal in size without focal abnormality. Adrenals/Urinary Tract: The adrenal glands are unremarkable. There is heterogeneous enhancement of the right kidney with mild right perinephric stranding concerning for right-sided pyelonephritis. Correlation with urinalysis recommended. No drainable fluid collection or abscess. The left kidney is unremarkable. The urinary bladder is partially distended and grossly unremarkable. Stomach/Bowel: Mildly  thickened appearance of the distal colon likely related to underdistention. Colitis is less likely. Clinical correlation is recommended. There is no bowel obstruction. The appendix is normal. Vascular/Lymphatic: Mild aortoiliac atherosclerotic disease. There is a circumaortic left renal vein anatomy. The IVC is unremarkable. No portal venous gas. There is no adenopathy. Reproductive: The uterus is anteverted and grossly unremarkable. There is a 2.5 cm fat containing lesion in the right ovary compatible with a dermoid. The left ovary is unremarkable. Other: None Musculoskeletal: Lower lumbar posterior fusion. No acute osseous pathology. IMPRESSION: 1. Right-sided pyelonephritis. Correlation with urinalysis recommended. No abscess. 2. Underdistention of the distal colon versus less likely colitis. Clinical correlation is recommended. No bowel obstruction. Normal appendix. 3. Severe fatty liver. 4. 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 5. Aortic Atherosclerosis (ICD10-I70.0). Electronically Signed   By: Anner Crete M.D.   On: 10/28/2019 15:52    Radiological Exams on Admission: CT Head Wo Contrast  Result Date: 10/28/2019 CLINICAL DATA:  Headache EXAM: CT HEAD WITHOUT CONTRAST TECHNIQUE: Contiguous axial images were obtained from the base of the skull through the vertex without intravenous contrast. COMPARISON:  10/25/2019 FINDINGS: Brain: No acute intracranial abnormality. Specifically, no hemorrhage, hydrocephalus, mass lesion, acute infarction, or significant  intracranial injury. Vascular: No hyperdense vessel or unexpected calcification. Skull: No acute calvarial abnormality. Sinuses/Orbits: Visualized paranasal sinuses and mastoids clear. Orbital soft tissues unremarkable. Other: None IMPRESSION: No acute intracranial abnormality. Electronically Signed   By: Rolm Baptise M.D.   On: 10/28/2019 15:42   CT ABDOMEN PELVIS W CONTRAST  Result Date: 10/28/2019 CLINICAL DATA:  44 year old female with abdominal pain. History of chronic alcoholism and pancreatitis. Concern for infection or abscess. EXAM: CT ABDOMEN AND PELVIS WITH CONTRAST TECHNIQUE: Multidetector CT imaging of the abdomen and pelvis was performed using the standard protocol following bolus administration of intravenous contrast. CONTRAST:  181mL OMNIPAQUE IOHEXOL 300 MG/ML  SOLN COMPARISON:  CT abdomen pelvis dated 09/04/2019. FINDINGS: Lower chest: The visualized lung bases are clear. No intra-abdominal free air or free fluid. Hepatobiliary: Severe fatty infiltration of the liver. No intrahepatic biliary ductal dilatation. Probable sludge within the gallbladder. No calcified stone or evidence of acute cholecystitis by CT. Pancreas: Unremarkable. No pancreatic ductal dilatation or surrounding inflammatory changes. Spleen: Normal in size without focal abnormality. Adrenals/Urinary Tract: The adrenal glands are unremarkable. There is heterogeneous enhancement of the right kidney with mild right perinephric stranding concerning for right-sided pyelonephritis. Correlation with urinalysis recommended. No drainable fluid collection or abscess. The left kidney is unremarkable. The urinary bladder is partially distended and grossly unremarkable. Stomach/Bowel: Mildly thickened appearance of the distal colon likely related to underdistention. Colitis is less likely. Clinical correlation is recommended. There is no bowel obstruction. The appendix is normal. Vascular/Lymphatic: Mild aortoiliac atherosclerotic disease.  There is a circumaortic left renal vein anatomy. The IVC is unremarkable. No portal venous gas. There is no adenopathy. Reproductive: The uterus is anteverted and grossly unremarkable. There is a 2.5 cm fat containing lesion in the right ovary compatible with a dermoid. The left ovary is unremarkable. Other: None Musculoskeletal: Lower lumbar posterior fusion. No acute osseous pathology. IMPRESSION: 1. Right-sided pyelonephritis. Correlation with urinalysis recommended. No abscess. 2. Underdistention of the distal colon versus less likely colitis. Clinical correlation is recommended. No bowel obstruction. Normal appendix. 3. Severe fatty liver. 4. 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 5. Aortic Atherosclerosis (ICD10-I70.0). Electronically Signed   By: Anner Crete M.D.   On: 10/28/2019 15:52   AM Labs Ordered, also please  review Full Orders  Family Communication: Admission, patients condition and plan of care including tests being ordered have been discussed with the Olivia Lester and her mother at bedside who indicate understanding and agree with the plan   Code Status - Full Code  Likely DC to  Home   Condition   stable  Roxan Hockey M.D on 10/28/2019 at 9:24 PM Go to [email or web address] -  for contact info  [facility] - Office  [phone]

## 2019-10-28 NOTE — ED Notes (Signed)
Date and time results received: 10/28/19 10:37 AM  (use smartphrase ".now" to insert current time)  Test: Magnesium Critical Value: 0.9  Name of Provider Notified: [facility]  Orders Received? Or Actions Taken?:

## 2019-10-28 NOTE — ED Provider Notes (Signed)
[facility] EMERGENCY DEPARTMENT Provider Note   CSN: [id number] Arrival date & time: 10/28/19  [id number]     History Chief Complaint  Patient presents with  . Alcohol Intoxication    Olivia Lester is a 44 y.o. female.  HPI      Olivia Lester is a 44 y.o. female with a longstanding history of alcohol abuse and consumption of at least 1/5 to 1/2 gallon of liquor or half case of beer daily, alcohol related seizures, polysubstance abuse, and pancreatitis.  she presents to the Emergency Department complaining of abdominal pain, vomiting, headache and low back pain.  She was seen here 3 days ago secondary to a alcohol related fall she was evaluated and states emergency department and discharged.  She reports another fall 2 days ago in which she fell backwards off a porch landing on her back.  Since that time she complains of increasing pain to her lower back that radiates into her right leg.  Pain is worse with bending and weightbearing.  Improves somewhat at rest.  She also states that she struck her head during the fall and briefly lost consciousness.  Since that time she complains of frontal headache.  She woke during the early morning hours with persistent, episodic vomiting and states that she feels "shaky."  Last drink of alcohol was 8 PM last evening.   Past Medical History:  Diagnosis Date  . Alcohol abuse   . Back injury   . Gastric ulcer   . Gastric ulcer 10/14/2013  . Hepatitis C 10/14/2013  . History of kidney stones   . 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 Pancreatitis, acute   . Panic attack   . Pneumonia   . Polysubstance abuse ([place])   . PONV (postoperative nausea and vomiting)   . Seizures [facility])     Patient Active Problem List   Diagnosis Date Noted  . Alcoholic gastritis [id number]  . DTs (delirium tremens) ([place])   . Urinary tract infection without hematuria   . Alcohol intoxication ([place]) 09/04/2019  . Alcohol withdrawal ([place]) 06/24/2019  . Sinus tachycardia  06/24/2019  . Essential hypertension 06/24/2019  . Hypokalemia 03/31/2019  . Cannabis abuse 03/11/2019  . Elevated transaminase level   . Alcohol abuse   . Polysubstance (excluding opioids) dependence, daily use ([place]) 09/13/2018  . Closed displaced fracture of fifth metatarsal bone of right foot 08/02/2018  . Dental abscess 12/25/2017  . Gastric ulcer 10/14/2013  . Transaminitis 10/14/2013  . Tobacco dependence 10/14/2013  . Hepatitis C 10/14/2013  . Thrombocytopenia ([place]) 10/13/2013  . Duodenitis 10/12/2013  . Alcoholism ([place]) 10/12/2013  . Abdominal pain, epigastric 10/12/2013  . ANKLE PAIN 01/01/2008    Past Surgical History:  Procedure Laterality Date  . BACK SURGERY    . BREAST SURGERY    . ESOPHAGOGASTRODUODENOSCOPY (EGD) WITH PROPOFOL N/A 10/14/2013   Procedure: ESOPHAGOGASTRODUODENOSCOPY (EGD) WITH PROPOFOL;  Surgeon: Rogene Houston, MD;  Location: [facility];  Service: Endoscopy;  Laterality: N/A;  . ORIF TOE FRACTURE Right 08/08/2018   Procedure: RIGHT FOOT INTERNAL FIXATION 5TH METATARSAL;  Surgeon: Newt Minion, MD;  Location: [facility];  Service: Orthopedics;  Laterality: Right;  . TONSILLECTOMY       OB History    Gravida  7   Para  3   Term  3   Preterm      AB  4   Living  3     SAB  2  TAB  2   Ectopic      Multiple      Live Births  3           Family History  Problem Relation Age of Onset  . Cancer Mother 29       ovarian   . Hypertension Mother   . Stroke Mother   . Early death Father   . Alcohol abuse Father     Social History   Tobacco Use  . Smoking status: Current Every Day Smoker    Packs/day: 1.00    Years: 20.00    Pack years: 20.00    Types: Cigarettes  . Smokeless tobacco: Never Used  Substance Use Topics  . Alcohol use: Yes    Alcohol/week: 18.0 standard drinks    Types: 18 Cans of beer per week    Comment: 1/5 -1/2 gallon of liqour daily or 18 beers daily  . Drug use: Yes    Types: Marijuana, Cocaine,  Methamphetamines    Comment: denies current cocaine/meth...current mj    Home Medications Prior to Admission medications   Medication Sig Start Date End Date Taking? Authorizing Provider  amLODipine (NORVASC) 5 MG tablet Take 1 tablet (5 mg total) by mouth daily. 09/07/19   Orson Eva, MD  cefdinir (OMNICEF) 300 MG capsule Take 1 capsule (300 mg total) by mouth every 12 (twelve) hours. 09/07/19   Orson Eva, MD  diphenhydrAMINE (BENADRYL) 25 mg capsule Take 25 mg by mouth daily as needed for allergies.    [provider]  Multiple Vitamin (MULTIVITAMIN WITH MINERALS) TABS tablet Take 1 tablet by mouth every other day.     [provider]    Allergies    Toradol [ketorolac tromethamine] and Asa [aspirin]  Review of Systems   Review of Systems  Constitutional: Negative for chills and fever.  HENT: Negative for trouble swallowing.   Respiratory: Negative for shortness of breath.   Cardiovascular: Negative for chest pain and leg swelling.  Gastrointestinal: Positive for abdominal pain, nausea and vomiting. Negative for abdominal distention.  Genitourinary: Negative for difficulty urinating, dysuria, flank pain and hematuria.  Musculoskeletal: Positive for back pain. Negative for arthralgias, joint swelling and neck pain.  Skin: Negative for color change and wound.  Neurological: Positive for tremors, syncope and headaches. Negative for dizziness, seizures and speech difficulty.  Psychiatric/Behavioral: Negative for confusion.    Physical Exam Updated Vital Signs BP 124/75   Pulse 89   Temp 99.2 F (37.3 C) (Oral)   Ht 5\' 6"  (1.676 m)   Wt 54.4 kg   SpO2 97%   BMI 19.37 kg/m   Physical Exam Vitals and nursing note reviewed.  Constitutional:      Appearance: She is not toxic-appearing.     Comments: Patient is thin and tremulous on exam  HENT:     Mouth/Throat:     Mouth: Mucous membranes are moist.  Eyes:     Extraocular Movements: Extraocular movements  intact.     Conjunctiva/sclera: Conjunctivae normal.     Pupils: Pupils are equal, round, and reactive to light.  Cardiovascular:     Rate and Rhythm: Normal rate and regular rhythm.     Pulses: Normal pulses.  Pulmonary:     Effort: Pulmonary effort is normal.     Breath sounds: Normal breath sounds.  Chest:     Chest wall: No tenderness.  Abdominal:     General: There is no distension.     Palpations: Abdomen  is soft. There is no mass.     Tenderness: There is abdominal tenderness. There is no right CVA tenderness, left CVA tenderness or guarding.  Musculoskeletal:        General: Tenderness and signs of injury present.     Cervical back: Normal range of motion. No tenderness.     Comments: Tenderness to palpation of the lower lumbar spine and bilateral paraspinal muscles.  Several healing abrasions and areas of ecchymosis to the lower back.  No hematomas.    Skin:    General: Skin is warm.     Capillary Refill: Capillary refill takes less than 2 seconds.     Findings: Bruising present.  Neurological:     General: No focal deficit present.     Mental Status: She is alert.     Sensory: Sensation is intact. No sensory deficit.     Motor: No weakness.     Comments: CN II through XII grossly intact.  Speech clear     ED Results / Procedures / Treatments   Labs (all labs ordered are listed, but only abnormal results are displayed) Labs Reviewed  COMPREHENSIVE METABOLIC PANEL - Abnormal; Notable for the following components:      Result Value   Potassium 2.7 (*)    Chloride 95 (*)    Calcium 8.6 (*)    AST 178 (*)    ALT 87 (*)    Anion gap 20 (*)    All other components within normal limits  LIPASE, BLOOD - Abnormal; Notable for the following components:   Lipase 69 (*)    All other components within normal limits  CBC WITH DIFFERENTIAL/PLATELET - Abnormal; Notable for the following components:   RBC 3.55 (*)    MCV 103.9 (*)    MCH 35.2 (*)    Platelets 97 (*)     Neutro Abs 9.3 (*)    Lymphs Abs 0.5 (*)    All other components within normal limits  ETHANOL - Abnormal; Notable for the following components:   Alcohol, Ethyl (B) 187 (*)    All other components within normal limits  URINALYSIS, ROUTINE W REFLEX MICROSCOPIC - Abnormal; Notable for the following components:   Glucose, UA >=500 (*)    Ketones, ur 20 (*)    All other components within normal limits  RAPID URINE DRUG SCREEN, HOSP PERFORMED - Abnormal; Notable for the following components:   Benzodiazepines POSITIVE (*)    Tetrahydrocannabinol POSITIVE (*)    All other components within normal limits  MAGNESIUM - Abnormal; Notable for the following components:   Magnesium 0.9 (*)    All other components within normal limits  SARS CORONAVIRUS 2 (TAT 6-24 HRS)  PREGNANCY, URINE    EKG None     Radiology CT Head Wo Contrast  Result Date: 10/28/2019 CLINICAL DATA:  Headache EXAM: CT HEAD WITHOUT CONTRAST TECHNIQUE: Contiguous axial images were obtained from the base of the skull through the vertex without intravenous contrast. COMPARISON:  10/25/2019 FINDINGS: Brain: No acute intracranial abnormality. Specifically, no hemorrhage, hydrocephalus, mass lesion, acute infarction, or significant intracranial injury. Vascular: No hyperdense vessel or unexpected calcification. Skull: No acute calvarial abnormality. Sinuses/Orbits: Visualized paranasal sinuses and mastoids clear. Orbital soft tissues unremarkable. Other: None IMPRESSION: No acute intracranial abnormality. Electronically Signed   By: Rolm Baptise M.D.   On: 10/28/2019 15:42   CT ABDOMEN PELVIS W CONTRAST  Result Date: 10/28/2019 CLINICAL DATA:  44 year old female with abdominal pain. History of chronic alcoholism  and pancreatitis. Concern for infection or abscess. EXAM: CT ABDOMEN AND PELVIS WITH CONTRAST TECHNIQUE: Multidetector CT imaging of the abdomen and pelvis was performed using the standard protocol following bolus  administration of intravenous contrast. CONTRAST:  134mL OMNIPAQUE IOHEXOL 300 MG/ML  SOLN COMPARISON:  CT abdomen pelvis dated 09/04/2019. FINDINGS: Lower chest: The visualized lung bases are clear. No intra-abdominal free air or free fluid. Hepatobiliary: Severe fatty infiltration of the liver. No intrahepatic biliary ductal dilatation. Probable sludge within the gallbladder. No calcified stone or evidence of acute cholecystitis by CT. Pancreas: Unremarkable. No pancreatic ductal dilatation or surrounding inflammatory changes. Spleen: Normal in size without focal abnormality. Adrenals/Urinary Tract: The adrenal glands are unremarkable. There is heterogeneous enhancement of the right kidney with mild right perinephric stranding concerning for right-sided pyelonephritis. Correlation with urinalysis recommended. No drainable fluid collection or abscess. The left kidney is unremarkable. The urinary bladder is partially distended and grossly unremarkable. Stomach/Bowel: Mildly thickened appearance of the distal colon likely related to underdistention. Colitis is less likely. Clinical correlation is recommended. There is no bowel obstruction. The appendix is normal. Vascular/Lymphatic: Mild aortoiliac atherosclerotic disease. There is a circumaortic left renal vein anatomy. The IVC is unremarkable. No portal venous gas. There is no adenopathy. Reproductive: The uterus is anteverted and grossly unremarkable. There is a 2.5 cm fat containing lesion in the right ovary compatible with a dermoid. The left ovary is unremarkable. Other: None Musculoskeletal: Lower lumbar posterior fusion. No acute osseous pathology. IMPRESSION: 1. Right-sided pyelonephritis. Correlation with urinalysis recommended. No abscess. 2. Underdistention of the distal colon versus less likely colitis. Clinical correlation is recommended. No bowel obstruction. Normal appendix. 3. Severe fatty liver. 4. 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 5. Aortic  Atherosclerosis (ICD10-I70.0). Electronically Signed   By: Anner Crete M.D.   On: 10/28/2019 15:52    Procedures Procedures (including critical care time)  Medications Ordered in ED Medications  dextrose 50 % solution 50 mL (has no administration in time range)  thiamine tablet 100 mg (has no administration in time range)  LORazepam (ATIVAN) injection 1 mg (has no administration in time range)  sodium chloride 0.9 % bolus 1,000 mL (0 mLs Intravenous Stopped 10/28/19 1305)  ondansetron (ZOFRAN) injection 4 mg (4 mg Intravenous Given 10/28/19 1002)  LORazepam (ATIVAN) injection 1 mg (1 mg Intravenous Given 10/28/19 1002)  magnesium sulfate IVPB 2 g 50 mL (0 g Intravenous Stopped 10/28/19 1305)  potassium chloride 10 mEq in 100 mL IVPB (0 mEq Intravenous Stopped 10/28/19 1305)  potassium chloride SA (KLOR-CON) CR tablet 40 mEq (40 mEq Oral Given 10/28/19 1101)    ED Course  I have reviewed the triage vital signs and the nursing notes.  Pertinent labs & imaging results that were available during my care of the patient were reviewed by me and considered in my medical decision making (see chart for details).    MDM Rules/Calculators/A&P                      Pt with hx of alcohol abuse and frequent ER visits, complaining of abdominal pain, vomiting and recurrent fall 2 days ago.  She is tremulous on exam and actively vomiting on arrival.  No significant abdominal tenderness on exam, abd is soft.     Laboratory show hypomagnesemia, elevated anion gap and hypokalemia all likely related to alcoholic ketoacidosis.  CT head reassuring.  CT abdomen/pelvis indicating pyelonephritis, but patient is without dysuria and urinalysis does not show evidence of  infection.  UDS shows positive for benzos and THC.  Urine obtained after patient given Ativan which is likely because of positive benzo result.   will consult hospitalist for admission.  Patient admitted by hospitalist   Final Clinical Impression(s)  / ED Diagnoses Final diagnoses:  Alcoholic ketoacidosis  Hypomagnesemia    Rx / DC Orders ED Discharge Orders    None       Kem Parkinson, PA-C 10/28/19 1631    Maudie Flakes, MD 10/29/19 [phone]

## 2019-10-28 NOTE — ED Notes (Signed)
Lab advised to draw labs at 20:00

## 2019-10-29 LAB — SARS CORONAVIRUS 2 (TAT 6-24 HRS): SARS Coronavirus 2: NEGATIVE

## 2019-12-01 ENCOUNTER — Emergency Department (HOSPITAL_COMMUNITY): Payer: Self-pay

## 2019-12-01 ENCOUNTER — Emergency Department (HOSPITAL_COMMUNITY)
Admission: EM | Admit: 2019-12-01 | Discharge: 2019-12-01 | Disposition: A | Discharge status: Home / Self Care | Payer: Self-pay | Attending: Emergency Medicine | Admitting: Emergency Medicine

## 2019-12-01 ENCOUNTER — Other Ambulatory Visit: Payer: Self-pay

## 2019-12-01 ENCOUNTER — Encounter (HOSPITAL_COMMUNITY): Payer: Self-pay | Admitting: Emergency Medicine

## 2019-12-01 DIAGNOSIS — W01198A Fall on same level from slipping, tripping and stumbling with subsequent striking against other object, initial encounter: Secondary | ICD-10-CM | POA: Insufficient documentation

## 2019-12-01 DIAGNOSIS — Z7982 Long term (current) use of aspirin: Secondary | ICD-10-CM | POA: Insufficient documentation

## 2019-12-01 DIAGNOSIS — Z23 Encounter for immunization: Secondary | ICD-10-CM | POA: Insufficient documentation

## 2019-12-01 DIAGNOSIS — S0181XA Laceration without foreign body of other part of head, initial encounter: Secondary | ICD-10-CM | POA: Insufficient documentation

## 2019-12-01 DIAGNOSIS — F17210 Nicotine dependence, cigarettes, uncomplicated: Secondary | ICD-10-CM | POA: Insufficient documentation

## 2019-12-01 DIAGNOSIS — Y9389 Activity, other specified: Secondary | ICD-10-CM | POA: Insufficient documentation

## 2019-12-01 DIAGNOSIS — Y92008 Other place in unspecified non-institutional (private) residence as the place of occurrence of the external cause: Secondary | ICD-10-CM | POA: Insufficient documentation

## 2019-12-01 DIAGNOSIS — Z79899 Other long term (current) drug therapy: Secondary | ICD-10-CM | POA: Insufficient documentation

## 2019-12-01 DIAGNOSIS — Y999 Unspecified external cause status: Secondary | ICD-10-CM | POA: Insufficient documentation

## 2019-12-01 DIAGNOSIS — S0990XA Unspecified injury of head, initial encounter: Secondary | ICD-10-CM

## 2019-12-01 MED ORDER — LIDOCAINE-EPINEPHRINE (PF) 2 %-1:200000 IJ SOLN
10.0000 mL | Freq: Once | INTRAMUSCULAR | Status: AC
  Administered 2019-12-01: 10 mL
  Filled 2019-12-01: qty 10

## 2019-12-01 MED ORDER — TETANUS-DIPHTH-ACELL PERTUSSIS 5-2.5-18.5 LF-MCG/0.5 IM SUSP
0.5000 mL | Freq: Once | INTRAMUSCULAR | Status: AC
  Administered 2019-12-01: 0.5 mL via INTRAMUSCULAR
  Filled 2019-12-01: qty 0.5

## 2019-12-01 MED ORDER — LIDOCAINE-EPINEPHRINE-TETRACAINE (LET) TOPICAL GEL
3.0000 mL | Freq: Once | TOPICAL | Status: AC
  Administered 2019-12-01: 04:00:00 3 mL via TOPICAL
  Filled 2019-12-01: qty 3

## 2019-12-01 NOTE — ED Triage Notes (Signed)
Pt reports tripping over her dog around 1900 last night. Pt has laceration to the right brow. Bleeding controlled at this time. Pt reports LOC for "a few minutes."

## 2019-12-01 NOTE — ED Provider Notes (Signed)
[facility] EMERGENCY DEPARTMENT Provider Note   CSN: [id number] Arrival date & time: 12/01/19  0342   Time seen 4:10 AM  History Chief Complaint  Patient presents with  . Laceration    Olivia Lester is a 44 y.o. female.  HPI   Patient told me about 8 PM last night, May 8 she tripped and fell over her dog and sustained a laceration on her head.  She states she was unconscious for 2 to 3 minutes.  Patient admits she had been drinking.  She states she put some Band-Aids on it and went to bed.  However she woke up in the middle the night and it was bleeding which prompted her ED visit.  Patient is now is very concerned about cosmetic repair and having a small scar.  She has had some nausea and states she has vomited twice.  She denies blurred vision or new numbness or tingling in her extremities.  She states her last tetanus was probably 10 years ago.  PCP Patient, No Pcp Per   Past Medical History:  Diagnosis Date  . Alcohol abuse   . Back injury   . Gastric ulcer   . Gastric ulcer 10/14/2013  . Hepatitis C 10/14/2013  . History of kidney stones   . 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 Pancreatitis, acute   . Panic attack   . Pneumonia   . Polysubstance abuse ([place])   . PONV (postoperative nausea and vomiting)   . Seizures [facility])     Patient Active Problem List   Diagnosis Date Noted  . Colitis 10/28/2019  . Polysubstance abuse ([place]) 10/28/2019  . Hypomagnesemia 10/28/2019  . Alcoholic hepatitis without ascites 10/28/2019  . Alcoholic gastritis [id number]  . DTs (delirium tremens) ([place])   . Urinary tract infection without hematuria   . Alcohol intoxication ([place]) 09/04/2019  . Alcohol withdrawal ([place]) 06/24/2019  . Sinus tachycardia 06/24/2019  . Essential hypertension 06/24/2019  . Hypokalemia 03/31/2019  . Cannabis abuse 03/11/2019  . Elevated transaminase level   . Alcohol abuse   . Closed displaced fracture of fifth metatarsal bone of right foot  08/02/2018  . Dental abscess 12/25/2017  . Gastric ulcer 10/14/2013  . Transaminitis 10/14/2013  . Tobacco dependence 10/14/2013  . Hepatitis C 10/14/2013  . Thrombocytopenia ([place]) 10/13/2013  . Duodenitis 10/12/2013  . Alcoholism ([place]) 10/12/2013  . Abdominal pain, epigastric 10/12/2013  . ANKLE PAIN 01/01/2008    Past Surgical History:  Procedure Laterality Date  . BACK SURGERY    . BREAST SURGERY    . ESOPHAGOGASTRODUODENOSCOPY (EGD) WITH PROPOFOL N/A 10/14/2013   Procedure: ESOPHAGOGASTRODUODENOSCOPY (EGD) WITH PROPOFOL;  Surgeon: Rogene Houston, MD;  Location: [facility];  Service: Endoscopy;  Laterality: N/A;  . ORIF TOE FRACTURE Right 08/08/2018   Procedure: RIGHT FOOT INTERNAL FIXATION 5TH METATARSAL;  Surgeon: Newt Minion, MD;  Location: [place];  Service: Orthopedics;  Laterality: Right;  . TONSILLECTOMY       OB History    Gravida  7   Para  3   Term  3   Preterm      AB  4   Living  3     SAB  2   TAB  2   Ectopic      Multiple      Live Births  3           Family History  Problem Relation Age of Onset  .  Cancer Mother 17       ovarian   . Hypertension Mother   . Stroke Mother   . Early death Father   . Alcohol abuse Father     Social History   Tobacco Use  . Smoking status: Current Every Day Smoker    Packs/day: 1.00    Years: 20.00    Pack years: 20.00    Types: Cigarettes  . Smokeless tobacco: Never Used  Substance Use Topics  . Alcohol use: Yes    Alcohol/week: 18.0 standard drinks    Types: 18 Cans of beer per week    Comment: 1/5 -1/2 gallon of liqour daily or 18 beers daily  . Drug use: Yes    Types: Marijuana, Cocaine, Methamphetamines    Comment: denies current cocaine/meth...current mj  lives with boyfriend  Home Medications Prior to Admission medications   Medication Sig Start Date End Date Taking? Authorizing Provider  amLODipine (NORVASC) 5 MG tablet Take 1 tablet (5 mg total) by mouth daily. 10/28/19   Roxan Hockey, MD  diphenhydrAMINE (BENADRYL) 25 mg capsule Take 25 mg by mouth daily as needed for allergies.    [provider]  folic acid (FOLVITE) 1 MG tablet Take 1 tablet (1 mg total) by mouth daily. 10/29/19   Roxan Hockey, MD  lipase/protease/amylase 24000-76000 units CPEP Take 1 capsule (24,000 Units total) by mouth 3 (three) times daily before meals. 10/29/19   Roxan Hockey, MD  Multiple Vitamin (MULTIVITAMIN WITH MINERALS) TABS tablet Take 1 tablet by mouth every other day.     [provider]  pantoprazole (PROTONIX) 40 MG tablet Take 1 tablet (40 mg total) by mouth daily. 10/29/19   Roxan Hockey, MD  thiamine 100 MG tablet Take 1 tablet (100 mg total) by mouth daily. 10/29/19   Roxan Hockey, MD    Allergies    Toradol [ketorolac tromethamine] and Asa [aspirin]  Review of Systems   Review of Systems  All other systems reviewed and are negative.   Physical Exam Updated Vital Signs BP (!) 123/98 (BP Location: Left Arm)   Pulse 77   Temp 98.2 F (36.8 C) (Oral)   Resp 16   Ht 5\' 6"  (1.676 m)   Wt 54.4 kg   SpO2 99%   BMI 19.36 kg/m   Physical Exam Vitals and nursing note reviewed.  Constitutional:      Appearance: Normal appearance.     Comments: Very underweight female who is uncoordinated and shaky when she came out of the bathroom.  She appears to be intoxicated.  HENT:     Head: Normocephalic.     Comments: Patient has a 4 cm laceration on her right forehead/temple area.  She is noted have a lot of bruising and swelling in that same area.    Right Ear: External ear normal.     Left Ear: External ear normal.     Nose: Nose normal.  Eyes:     Extraocular Movements: Extraocular movements intact.     Conjunctiva/sclera: Conjunctivae normal.     Pupils: Pupils are equal, round, and reactive to light.  Cardiovascular:     Rate and Rhythm: Normal rate and regular rhythm.  Pulmonary:     Effort: Pulmonary effort is normal. No respiratory  distress.     Breath sounds: Normal breath sounds.  Musculoskeletal:        General: Normal range of motion.     Cervical back: Normal range of motion and neck supple. No  rigidity or tenderness.  Skin:    General: Skin is warm and dry.  Neurological:     Mental Status: She is alert.     Cranial Nerves: Cranial nerves are intact.     Motor: Motor function is intact.     Comments: Patient has some uncoordination and seems a little bit off balance when she is walking.  Psychiatric:        Mood and Affect: Affect is labile.        Speech: Speech is rapid and pressured.        Behavior: Behavior is cooperative.     Comments: Starts crying because today's mother stating her children are go see her with the laceration.         ED Results / Procedures / Treatments   Labs (all labs ordered are listed, but only abnormal results are displayed) Labs Reviewed - No data to display  EKG None  Radiology CT Head Wo Contrast  CT Maxillofacial WO CM  Result Date: 12/01/2019 CLINICAL DATA:  Initial evaluation for acute trauma, fall. EXAM: CT HEAD WITHOUT CONTRAST CT MAXILLOFACIAL WITHOUT CONTRAST TECHNIQUE: Multidetector CT imaging of the head and maxillofacial structures were performed using the standard protocol without intravenous contrast. Multiplanar CT image reconstructions of the maxillofacial structures were also generated. COMPARISON:  Prior head CT from 10/28/2019. FINDINGS: CT HEAD FINDINGS Brain: Mild age-related cerebral atrophy with chronic small vessel ischemic disease. No acute intracranial hemorrhage. No acute large vessel territory infarct. No mass lesion, midline shift or mass effect. No hydrocephalus or extra-axial fluid collection. Vascular: No hyperdense vessel. Skull: Soft tissue contusion present at the right frontotemporal scalp. No calvarial fracture. Other: Mastoid air cells are clear. CT MAXILLOFACIAL FINDINGS Osseous: Zygomatic arches intact. No acute maxillary fracture.  Pterygoid plates intact. Nasal bones intact. Right-to-left nasal septal deviation without fracture. No acute mandibular fracture. Mandibular condyles normally situated. Multiple scattered dental caries noted about the remaining dentition. Orbits: Globes and orbital soft tissues within normal limits. Bony orbits intact. Sinuses: Mild mucosal thickening noted within the right maxillary sinus. Paranasal sinuses are otherwise clear. No hemosinus. Soft tissues: Soft tissue contusion present at the right frontotemporal/right zygomatic region. No other acute maxillofacial injury. IMPRESSION: 1. Soft tissue contusion/laceration overlying the right frontotemporal scalp/zygomatic region. 2. No acute intracranial abnormality. 3. No other acute maxillofacial injury.  No fracture. Electronically Signed   By: Jeannine Boga M.D.   On: 12/01/2019 05:16    Procedures .Marland KitchenLaceration Repair  Date/Time: 12/01/2019 6:40 AM Performed by: Rolland Porter, MD Authorized by: Rolland Porter, MD   Consent:    Consent obtained:  Verbal   Consent given by:  Patient Anesthesia (see MAR for exact dosages):    Anesthesia method:  Topical application and local infiltration   Topical anesthetic:  LET   Local anesthetic:  Lidocaine 2% WITH epi Laceration details:    Location:  Face   Length (cm):  4   Laceration depth: into subcutaneous tissue. Repair type:    Repair type:  Intermediate Pre-procedure details:    Preparation:  Patient was prepped and draped in usual sterile fashion and imaging obtained to evaluate for foreign bodies Exploration:    Hemostasis achieved with:  Direct pressure   Wound extent: no areolar tissue violation noted, no foreign bodies/material noted, no muscle damage noted, no tendon damage noted, no underlying fracture noted and no vascular damage noted     Contaminated: no   Treatment:    Area cleansed with:  Saline  Amount of cleaning:  Standard Subcutaneous repair:    Suture size:  6-0   Suture  material:  Vicryl   Number of sutures:  6 Skin repair:    Repair method:  Sutures   Suture size:  6-0   Suture material:  Nylon   Suture technique:  Simple interrupted   Number of sutures:  12 Approximation:    Approximation:  Close Post-procedure details:    Dressing:  Open (no dressing)   Patient tolerance of procedure:  Tolerated well, no immediate complications   (including critical care time)  Medications Ordered in ED Medications  Tdap (BOOSTRIX) injection 0.5 mL (0.5 mLs Intramuscular Given 12/01/19 0426)  lidocaine-EPINEPHrine-tetracaine (LET) topical gel (3 mLs Topical Given 12/01/19 0427)  lidocaine-EPINEPHrine (XYLOCAINE W/EPI) 2 %-1:200000 (PF) injection 10 mL (10 mLs Infiltration Given by Other 12/01/19 [id number])    ED Course  I have reviewed the triage vital signs and the nursing notes.  Pertinent labs & imaging results that were available during my care of the patient were reviewed by me and considered in my medical decision making (see chart for details).    MDM Rules/Calculators/A&P                      Patient's tetanus status was updated.  CT of the head and maxillofacial was done.  Patient had no pain to palpation of her neck.  Patient scans were normal.  I sutured her wounds.  I had made another patient wait to be seen why I sutured her wounds because the other patient was not in the room when I went to see her.  I told her I would do her discharge instructions as soon as I saw that patient quickly.  However nurses report patient slipped out of the ED and did not get discharge instructions.  Final Clinical Impression(s) / ED Diagnoses Final diagnoses:  Facial laceration, initial encounter  Injury of head, initial encounter    Rx / DC Orders ED Discharge Orders    None      Patient eloped after she was sutured.  Rolland Porter, MD, Barbette Or, MD 12/01/19 [phone]

## 2020-01-07 ENCOUNTER — Inpatient Hospital Stay (HOSPITAL_COMMUNITY)
Admission: RE | Admit: 2020-01-07 | Discharge: 2020-01-08 | DRG: 894 | Discharge status: Against Medical Advice | Payer: Self-pay | Attending: Family Medicine | Admitting: Family Medicine

## 2020-01-07 ENCOUNTER — Emergency Department (HOSPITAL_COMMUNITY): Payer: Self-pay

## 2020-01-07 ENCOUNTER — Encounter (HOSPITAL_COMMUNITY): Payer: Self-pay

## 2020-01-07 ENCOUNTER — Other Ambulatory Visit: Payer: Self-pay

## 2020-01-07 DIAGNOSIS — R1011 Right upper quadrant pain: Secondary | ICD-10-CM

## 2020-01-07 DIAGNOSIS — Z811 Family history of alcohol abuse and dependence: Secondary | ICD-10-CM

## 2020-01-07 DIAGNOSIS — Z8249 Family history of ischemic heart disease and other diseases of the circulatory system: Secondary | ICD-10-CM

## 2020-01-07 DIAGNOSIS — I10 Essential (primary) hypertension: Secondary | ICD-10-CM | POA: Diagnosis present

## 2020-01-07 DIAGNOSIS — Z888 Allergy status to other drugs, medicaments and biological substances status: Secondary | ICD-10-CM

## 2020-01-07 DIAGNOSIS — K2920 Alcoholic gastritis without bleeding: Secondary | ICD-10-CM | POA: Diagnosis present

## 2020-01-07 DIAGNOSIS — F10230 Alcohol dependence with withdrawal, uncomplicated: Principal | ICD-10-CM | POA: Diagnosis present

## 2020-01-07 DIAGNOSIS — B1920 Unspecified viral hepatitis C without hepatic coma: Secondary | ICD-10-CM | POA: Diagnosis present

## 2020-01-07 DIAGNOSIS — Z20822 Contact with and (suspected) exposure to covid-19: Secondary | ICD-10-CM | POA: Diagnosis present

## 2020-01-07 DIAGNOSIS — Z79899 Other long term (current) drug therapy: Secondary | ICD-10-CM

## 2020-01-07 DIAGNOSIS — F1010 Alcohol abuse, uncomplicated: Secondary | ICD-10-CM | POA: Diagnosis present

## 2020-01-07 DIAGNOSIS — F419 Anxiety disorder, unspecified: Secondary | ICD-10-CM | POA: Diagnosis present

## 2020-01-07 DIAGNOSIS — F10239 Alcohol dependence with withdrawal, unspecified: Secondary | ICD-10-CM

## 2020-01-07 DIAGNOSIS — K8681 Exocrine pancreatic insufficiency: Secondary | ICD-10-CM | POA: Diagnosis present

## 2020-01-07 DIAGNOSIS — Z886 Allergy status to analgesic agent status: Secondary | ICD-10-CM

## 2020-01-07 DIAGNOSIS — E876 Hypokalemia: Secondary | ICD-10-CM | POA: Diagnosis present

## 2020-01-07 DIAGNOSIS — D6959 Other secondary thrombocytopenia: Secondary | ICD-10-CM | POA: Diagnosis present

## 2020-01-07 DIAGNOSIS — K860 Alcohol-induced chronic pancreatitis: Secondary | ICD-10-CM | POA: Diagnosis present

## 2020-01-07 DIAGNOSIS — K859 Acute pancreatitis without necrosis or infection, unspecified: Secondary | ICD-10-CM | POA: Diagnosis present

## 2020-01-07 DIAGNOSIS — K7010 Alcoholic hepatitis without ascites: Secondary | ICD-10-CM | POA: Diagnosis present

## 2020-01-07 DIAGNOSIS — F1910 Other psychoactive substance abuse, uncomplicated: Secondary | ICD-10-CM | POA: Diagnosis present

## 2020-01-07 DIAGNOSIS — F17210 Nicotine dependence, cigarettes, uncomplicated: Secondary | ICD-10-CM | POA: Diagnosis present

## 2020-01-07 DIAGNOSIS — R569 Unspecified convulsions: Secondary | ICD-10-CM

## 2020-01-07 DIAGNOSIS — Z8711 Personal history of peptic ulcer disease: Secondary | ICD-10-CM

## 2020-01-07 DIAGNOSIS — F1210 Cannabis abuse, uncomplicated: Secondary | ICD-10-CM | POA: Diagnosis present

## 2020-01-07 DIAGNOSIS — Z87442 Personal history of urinary calculi: Secondary | ICD-10-CM

## 2020-01-07 DIAGNOSIS — F10939 Alcohol use, unspecified with withdrawal, unspecified: Secondary | ICD-10-CM | POA: Diagnosis present

## 2020-01-07 LAB — CBC WITH DIFFERENTIAL/PLATELET
Abs Immature Granulocytes: 0.04 10*3/uL (ref 0.00–0.07)
Basophils Absolute: 0 10*3/uL (ref 0.0–0.1)
Basophils Relative: 0 %
Eosinophils Absolute: 0 10*3/uL (ref 0.0–0.5)
Eosinophils Relative: 0 %
HCT: 42.4 % (ref 36.0–46.0)
Hemoglobin: 14.9 g/dL (ref 12.0–15.0)
Immature Granulocytes: 1 %
Lymphocytes Relative: 8 %
Lymphs Abs: 0.6 10*3/uL — ABNORMAL LOW (ref 0.7–4.0)
MCH: 35.4 pg — ABNORMAL HIGH (ref 26.0–34.0)
MCHC: 35.1 g/dL (ref 30.0–36.0)
MCV: 100.7 fL — ABNORMAL HIGH (ref 80.0–100.0)
Monocytes Absolute: 0.4 10*3/uL (ref 0.1–1.0)
Monocytes Relative: 5 %
Neutro Abs: 7.1 10*3/uL (ref 1.7–7.7)
Neutrophils Relative %: 86 %
Platelets: 98 10*3/uL — ABNORMAL LOW (ref 150–400)
RBC: 4.21 MIL/uL (ref 3.87–5.11)
RDW: 13.2 % (ref 11.5–15.5)
WBC: 8.2 10*3/uL (ref 4.0–10.5)
nRBC: 0 % (ref 0.0–0.2)

## 2020-01-07 LAB — URINALYSIS, ROUTINE W REFLEX MICROSCOPIC
Glucose, UA: NEGATIVE mg/dL
Ketones, ur: 20 mg/dL — AB
Leukocytes,Ua: NEGATIVE
Nitrite: NEGATIVE
Protein, ur: 100 mg/dL — AB
RBC / HPF: >50 RBC/hpf — ABNORMAL HIGH (ref 0–5)
Specific Gravity, Urine: 1.029 (ref 1.005–1.030)
pH: 7 (ref 5.0–8.0)

## 2020-01-07 LAB — BASIC METABOLIC PANEL
BUN: 10 mg/dL (ref 6–20)
CO2: 22 mmol/L (ref 22–32)
Calcium: 8.8 mg/dL — ABNORMAL LOW (ref 8.9–10.3)
Chloride: 88 mmol/L — ABNORMAL LOW (ref 98–111)
Creatinine, Ser: 1.05 mg/dL — ABNORMAL HIGH (ref 0.44–1.00)
GFR calc Af Amer: >60 mL/min (ref >60)
GFR calc non Af Amer: >60 mL/min (ref >60)
Glucose, Bld: 83 mg/dL (ref 70–99)
Potassium: 2.3 mmol/L — CL (ref 3.5–5.1)
Sodium: 136 mmol/L (ref 135–145)

## 2020-01-07 LAB — PHOSPHORUS: Phosphorus: 3.8 mg/dL (ref 2.5–4.6)

## 2020-01-07 LAB — CBG MONITORING, ED: Glucose-Capillary: 95 mg/dL (ref 70–99)

## 2020-01-07 LAB — SARS CORONAVIRUS 2 BY RT PCR (HOSPITAL ORDER, PERFORMED IN ~~LOC~~ HOSPITAL LAB): SARS Coronavirus 2: NEGATIVE

## 2020-01-07 LAB — MAGNESIUM: Magnesium: 0.7 mg/dL — CL (ref 1.7–2.4)

## 2020-01-07 LAB — POC URINE PREG, ED: Preg Test, Ur: NEGATIVE

## 2020-01-07 LAB — LIPASE, BLOOD: Lipase: 334 U/L — ABNORMAL HIGH (ref 11–51)

## 2020-01-07 MED ORDER — LORAZEPAM 2 MG/ML IJ SOLN
INTRAMUSCULAR | Status: AC
  Administered 2020-01-07: 2 mg via INTRAVENOUS
  Filled 2020-01-07: qty 1

## 2020-01-07 MED ORDER — ADULT MULTIVITAMIN W/MINERALS CH: 1.0000 | ORAL_TABLET | ORAL | Status: DC

## 2020-01-07 MED ORDER — LORAZEPAM 2 MG/ML IJ SOLN: 0.0000 mg | Freq: Two times a day (BID) | INTRAMUSCULAR | Status: DC

## 2020-01-07 MED ORDER — LORAZEPAM 1 MG PO TABS
0.0000 mg | ORAL_TABLET | Freq: Four times a day (QID) | ORAL | Status: DC
  Filled 2020-01-07: qty 2

## 2020-01-07 MED ORDER — DIAZEPAM 2 MG PO TABS
2.0000 mg | ORAL_TABLET | Freq: Three times a day (TID) | ORAL | Status: DC
  Administered 2020-01-07 – 2020-01-08 (×4): 2 mg via ORAL
  Filled 2020-01-07 (×4): qty 1

## 2020-01-07 MED ORDER — POTASSIUM CHLORIDE CRYS ER 20 MEQ PO TBCR
40.0000 meq | EXTENDED_RELEASE_TABLET | Freq: Once | ORAL | Status: AC
  Administered 2020-01-07: 40 meq via ORAL
  Filled 2020-01-07: qty 2

## 2020-01-07 MED ORDER — NICOTINE 14 MG/24HR TD PT24
14.0000 mg | MEDICATED_PATCH | Freq: Every day | TRANSDERMAL | Status: DC
  Administered 2020-01-07 – 2020-01-08 (×2): 14 mg via TRANSDERMAL
  Filled 2020-01-07 (×2): qty 1

## 2020-01-07 MED ORDER — FENTANYL CITRATE (PF) 100 MCG/2ML IJ SOLN: 50.0000 ug | Freq: Once | INTRAMUSCULAR | Status: DC

## 2020-01-07 MED ORDER — MAGNESIUM SULFATE 2 GM/50ML IV SOLN
2.0000 g | Freq: Once | INTRAVENOUS | Status: AC
  Administered 2020-01-07: 2 g via INTRAVENOUS
  Filled 2020-01-07: qty 50

## 2020-01-07 MED ORDER — POTASSIUM CHLORIDE 10 MEQ/100ML IV SOLN
10.0000 meq | INTRAVENOUS | Status: AC
  Administered 2020-01-07 (×4): 10 meq via INTRAVENOUS
  Filled 2020-01-07 (×4): qty 100

## 2020-01-07 MED ORDER — SODIUM CHLORIDE 0.9 % IV SOLN: 250.0000 mL | INTRAVENOUS | Status: DC | PRN

## 2020-01-07 MED ORDER — SODIUM CHLORIDE 0.9% FLUSH: 3.0000 mL | INTRAVENOUS | Status: DC | PRN

## 2020-01-07 MED ORDER — PANCRELIPASE (LIP-PROT-AMYL) 12000-38000 UNITS PO CPEP
24000.0000 [IU] | ORAL_CAPSULE | Freq: Three times a day (TID) | ORAL | Status: DC
  Administered 2020-01-07 – 2020-01-08 (×3): 24000 [IU] via ORAL
  Filled 2020-01-07 (×3): qty 2

## 2020-01-07 MED ORDER — MORPHINE SULFATE (PF) 4 MG/ML IV SOLN
6.0000 mg | Freq: Once | INTRAVENOUS | Status: AC
  Administered 2020-01-07: 6 mg via INTRAVENOUS
  Filled 2020-01-07: qty 2

## 2020-01-07 MED ORDER — ADULT MULTIVITAMIN W/MINERALS CH
1.0000 | ORAL_TABLET | Freq: Every day | ORAL | Status: DC
  Administered 2020-01-07 – 2020-01-08 (×2): 1 via ORAL
  Filled 2020-01-07 (×2): qty 1

## 2020-01-07 MED ORDER — DEXTROSE 50 % IV SOLN: 25.0000 g | Freq: Once | INTRAVENOUS | Status: DC | PRN

## 2020-01-07 MED ORDER — LIDOCAINE VISCOUS HCL 2 % MT SOLN
15.0000 mL | Freq: Once | OROMUCOSAL | Status: AC
  Administered 2020-01-07: 15 mL via ORAL
  Filled 2020-01-07: qty 15

## 2020-01-07 MED ORDER — LORAZEPAM 1 MG PO TABS: 0.0000 mg | ORAL_TABLET | Freq: Two times a day (BID) | ORAL | Status: DC

## 2020-01-07 MED ORDER — THIAMINE HCL 100 MG PO TABS
100.0000 mg | ORAL_TABLET | Freq: Every day | ORAL | Status: DC
  Administered 2020-01-07 – 2020-01-08 (×2): 100 mg via ORAL
  Filled 2020-01-07 (×2): qty 1

## 2020-01-07 MED ORDER — POTASSIUM CHLORIDE 10 MEQ/100ML IV SOLN: 10.0000 meq | INTRAVENOUS | Status: DC

## 2020-01-07 MED ORDER — METOCLOPRAMIDE HCL 5 MG/ML IJ SOLN
10.0000 mg | Freq: Once | INTRAMUSCULAR | Status: AC
  Administered 2020-01-07: 10 mg via INTRAVENOUS
  Filled 2020-01-07: qty 2

## 2020-01-07 MED ORDER — PANTOPRAZOLE SODIUM 40 MG IV SOLR
40.0000 mg | Freq: Once | INTRAVENOUS | Status: AC
  Administered 2020-01-07: 40 mg via INTRAVENOUS
  Filled 2020-01-07: qty 40

## 2020-01-07 MED ORDER — AMLODIPINE BESYLATE 5 MG PO TABS
5.0000 mg | ORAL_TABLET | Freq: Every day | ORAL | Status: DC
  Administered 2020-01-07 – 2020-01-08 (×2): 5 mg via ORAL
  Filled 2020-01-07 (×2): qty 1

## 2020-01-07 MED ORDER — ONDANSETRON HCL 4 MG/2ML IJ SOLN: 4.0000 mg | Freq: Four times a day (QID) | INTRAMUSCULAR | Status: DC | PRN

## 2020-01-07 MED ORDER — LORAZEPAM 2 MG/ML IJ SOLN: 1.0000 mg | Freq: Once | INTRAMUSCULAR | Status: DC | PRN

## 2020-01-07 MED ORDER — HEPARIN SODIUM (PORCINE) 5000 UNIT/ML IJ SOLN
5000.0000 [IU] | Freq: Three times a day (TID) | INTRAMUSCULAR | Status: DC
  Administered 2020-01-07 – 2020-01-08 (×2): 5000 [IU] via SUBCUTANEOUS
  Filled 2020-01-07 (×2): qty 1

## 2020-01-07 MED ORDER — LACTATED RINGERS IV SOLN: INTRAVENOUS | Status: AC

## 2020-01-07 MED ORDER — TRAZODONE HCL 50 MG PO TABS
150.0000 mg | ORAL_TABLET | Freq: Every day | ORAL | Status: DC
  Administered 2020-01-07: 150 mg via ORAL
  Filled 2020-01-07: qty 3

## 2020-01-07 MED ORDER — PANTOPRAZOLE SODIUM 40 MG PO TBEC
40.0000 mg | DELAYED_RELEASE_TABLET | Freq: Every day | ORAL | Status: DC
  Administered 2020-01-07 – 2020-01-08 (×2): 40 mg via ORAL
  Filled 2020-01-07 (×2): qty 1

## 2020-01-07 MED ORDER — SODIUM CHLORIDE 0.9% FLUSH
3.0000 mL | Freq: Two times a day (BID) | INTRAVENOUS | Status: DC
  Administered 2020-01-07 – 2020-01-08 (×2): 3 mL via INTRAVENOUS

## 2020-01-07 MED ORDER — LORAZEPAM 2 MG/ML IJ SOLN
0.0000 mg | Freq: Four times a day (QID) | INTRAMUSCULAR | Status: DC
  Administered 2020-01-07: 1 mg via INTRAVENOUS
  Administered 2020-01-07: 2 mg via INTRAVENOUS
  Administered 2020-01-08 (×2): 1 mg via INTRAVENOUS
  Filled 2020-01-07 (×5): qty 1

## 2020-01-07 MED ORDER — ALUM & MAG HYDROXIDE-SIMETH 200-200-20 MG/5ML PO SUSP
30.0000 mL | Freq: Once | ORAL | Status: AC
  Administered 2020-01-07: 30 mL via ORAL
  Filled 2020-01-07: qty 30

## 2020-01-07 MED ORDER — POLYETHYLENE GLYCOL 3350 17 G PO PACK: 17.0000 g | PACK | Freq: Every day | ORAL | Status: DC | PRN

## 2020-01-07 MED ORDER — ALBUTEROL SULFATE (2.5 MG/3ML) 0.083% IN NEBU: 2.5000 mg | INHALATION_SOLUTION | RESPIRATORY_TRACT | Status: DC | PRN

## 2020-01-07 MED ORDER — THIAMINE HCL 100 MG/ML IJ SOLN
100.0000 mg | Freq: Once | INTRAMUSCULAR | Status: AC
  Administered 2020-01-07: 100 mg via INTRAVENOUS
  Filled 2020-01-07: qty 2

## 2020-01-07 MED ORDER — FOLIC ACID 1 MG PO TABS
1.0000 mg | ORAL_TABLET | Freq: Every day | ORAL | Status: DC
  Administered 2020-01-07 – 2020-01-08 (×2): 1 mg via ORAL
  Filled 2020-01-07 (×2): qty 1

## 2020-01-07 MED ORDER — POTASSIUM CHLORIDE CRYS ER 20 MEQ PO TBCR
40.0000 meq | EXTENDED_RELEASE_TABLET | ORAL | Status: AC
  Administered 2020-01-07: 40 meq via ORAL
  Filled 2020-01-07: qty 2

## 2020-01-07 MED ORDER — LACTATED RINGERS IV BOLUS
1000.0000 mL | Freq: Once | INTRAVENOUS | Status: AC
  Administered 2020-01-07: 1000 mL via INTRAVENOUS

## 2020-01-07 MED ORDER — DIPHENHYDRAMINE HCL 25 MG PO CAPS: 25.0000 mg | ORAL_CAPSULE | Freq: Every day | ORAL | Status: DC | PRN

## 2020-01-07 MED ORDER — ONDANSETRON HCL 4 MG PO TABS: 4.0000 mg | ORAL_TABLET | Freq: Four times a day (QID) | ORAL | Status: DC | PRN

## 2020-01-07 NOTE — ED Provider Notes (Signed)
[facility] EMERGENCY DEPARTMENT Provider Note   CSN: [id number] Arrival date & time: 01/07/20  4403     History Chief Complaint  Patient presents with  . Seizures    Olivia Lester is a 44 y.o. female.  HPI     44 year old female comes in a chief complaint of abdominal pain, seizure-like activity, nausea and vomiting. Patient reports that her last alcoholic beverage was on Sunday morning.  Sunday evening she started having nausea, vomiting and abdominal pain.  Yesterday she had 3 episodes of seizures.  She does not have any underlying seizure disorders.  She has had severe alcohol withdrawals before and is suspecting that she is having them again.  She is having tremors.  Patient has epigastric abdominal pain that is radiating towards the side and back.  She has had several episodes of vomiting without any diarrhea.  Past Medical History:  Diagnosis Date  . Alcohol abuse   . Back injury   . Gastric ulcer   . Gastric ulcer 10/14/2013  . Hepatitis C 10/14/2013  . History of kidney stones   . 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 Pancreatitis, acute   . Panic attack   . Pneumonia   . Polysubstance abuse ([place])   . PONV (postoperative nausea and vomiting)   . Seizures [facility])     Patient Active Problem List   Diagnosis Date Noted  . Colitis 10/28/2019  . Polysubstance abuse ([place]) 10/28/2019  . Hypomagnesemia 10/28/2019  . Alcoholic hepatitis without ascites 10/28/2019  . Alcoholic gastritis 47/42/5956  . DTs (delirium tremens) ([place])   . Urinary tract infection without hematuria   . Alcohol intoxication ([place]) 09/04/2019  . Alcohol withdrawal ([place]) 06/24/2019  . Sinus tachycardia 06/24/2019  . Essential hypertension 06/24/2019  . Hypokalemia 03/31/2019  . Cannabis abuse 03/11/2019  . Elevated transaminase level   . Alcohol abuse   . Closed displaced fracture of fifth metatarsal bone of right foot 08/02/2018  . Dental abscess 12/25/2017  . Gastric ulcer  10/14/2013  . Transaminitis 10/14/2013  . Tobacco dependence 10/14/2013  . Hepatitis C 10/14/2013  . Thrombocytopenia ([place]) 10/13/2013  . Duodenitis 10/12/2013  . Alcoholism ([place]) 10/12/2013  . Abdominal pain, epigastric 10/12/2013  . ANKLE PAIN 01/01/2008    Past Surgical History:  Procedure Laterality Date  . BACK SURGERY    . BREAST SURGERY    . ESOPHAGOGASTRODUODENOSCOPY (EGD) WITH PROPOFOL N/A 10/14/2013   Procedure: ESOPHAGOGASTRODUODENOSCOPY (EGD) WITH PROPOFOL;  Surgeon: Rogene Houston, MD;  Location: [facility];  Service: Endoscopy;  Laterality: N/A;  . ORIF TOE FRACTURE Right 08/08/2018   Procedure: RIGHT FOOT INTERNAL FIXATION 5TH METATARSAL;  Surgeon: Newt Minion, MD;  Location: [place];  Service: Orthopedics;  Laterality: Right;  . TONSILLECTOMY       OB History    Gravida  7   Para  3   Term  3   Preterm      AB  4   Living  3     SAB  2   TAB  2   Ectopic      Multiple      Live Births  3           Family History  Problem Relation Age of Onset  . Cancer Mother 74       ovarian   . Hypertension Mother   . Stroke Mother   . Early death Father   . Alcohol abuse Father  Social History   Tobacco Use  . Smoking status: Current Every Day Smoker    Packs/day: 1.00    Years: 20.00    Pack years: 20.00    Types: Cigarettes  . Smokeless tobacco: Never Used  Vaping Use  . Vaping Use: Never used  Substance Use Topics  . Alcohol use: Yes    Alcohol/week: 18.0 standard drinks    Types: 18 Cans of beer per week    Comment: 1/5 -1/2 gallon of liqour daily or 18 beers daily  . Drug use: Yes    Types: Marijuana, Cocaine, Methamphetamines    Comment: denies current cocaine/meth...current mj    Home Medications Prior to Admission medications   Medication Sig Start Date End Date Taking? Authorizing Provider  amLODipine (NORVASC) 5 MG tablet Take 1 tablet (5 mg total) by mouth daily. 10/28/19   Roxan Hockey, MD  diphenhydrAMINE (BENADRYL)  25 mg capsule Take 25 mg by mouth daily as needed for allergies.    [provider]  folic acid (FOLVITE) 1 MG tablet Take 1 tablet (1 mg total) by mouth daily. 10/29/19   Roxan Hockey, MD  lipase/protease/amylase 24000-76000 units CPEP Take 1 capsule (24,000 Units total) by mouth 3 (three) times daily before meals. 10/29/19   Roxan Hockey, MD  Multiple Vitamin (MULTIVITAMIN WITH MINERALS) TABS tablet Take 1 tablet by mouth every other day.     [provider]  pantoprazole (PROTONIX) 40 MG tablet Take 1 tablet (40 mg total) by mouth daily. 10/29/19   Roxan Hockey, MD  thiamine 100 MG tablet Take 1 tablet (100 mg total) by mouth daily. 10/29/19   Roxan Hockey, MD    Allergies    Toradol [ketorolac tromethamine] and Asa [aspirin]  Review of Systems   Review of Systems  Constitutional: Positive for activity change.  Cardiovascular: Positive for chest pain.  Gastrointestinal: Positive for abdominal pain, nausea and vomiting.  Allergic/Immunologic: Negative for immunocompromised state.  Neurological: Positive for seizures. Negative for weakness, numbness and headaches.  All other systems reviewed and are negative.   Physical Exam Updated Vital Signs BP (!) 122/93   Pulse (!) 134   Temp 98.6 F (37 C) (Oral)   Resp 20   Ht 5\' 7"  (1.702 m)   Wt 53.5 kg   SpO2 99%   BMI 18.48 kg/m   Physical Exam Vitals and nursing note reviewed.  Constitutional:      Appearance: She is well-developed.  HENT:     Head: Normocephalic and atraumatic.  Eyes:     Extraocular Movements: Extraocular movements intact.     Pupils: Pupils are equal, round, and reactive to light.  Cardiovascular:     Rate and Rhythm: Regular rhythm. Tachycardia present.     Heart sounds: Normal heart sounds. No murmur heard.   Pulmonary:     Effort: Pulmonary effort is normal. No respiratory distress.  Abdominal:     General: There is no distension.     Palpations: Abdomen is soft.      Tenderness: There is abdominal tenderness. There is no guarding or rebound.  Musculoskeletal:     Cervical back: Neck supple.  Skin:    General: Skin is warm and dry.  Neurological:     Mental Status: She is alert and oriented to person, place, and time.     ED Results / Procedures / Treatments   Labs (all labs ordered are listed, but only abnormal results are displayed) Labs Reviewed  BASIC METABOLIC PANEL -  Abnormal; Notable for the following components:      Result Value   Potassium 2.3 (*)    Chloride 88 (*)    Creatinine, Ser 1.05 (*)    Calcium 8.8 (*)    All other components within normal limits  MAGNESIUM - Abnormal; Notable for the following components:   Magnesium 0.7 (*)    All other components within normal limits  LIPASE, BLOOD - Abnormal; Notable for the following components:   Lipase 334 (*)    All other components within normal limits  SARS CORONAVIRUS 2 BY RT PCR (HOSPITAL ORDER, [facility])  PHOSPHORUS  CBC WITH DIFFERENTIAL/PLATELET  URINALYSIS, ROUTINE W REFLEX MICROSCOPIC  CBG MONITORING, ED  POC URINE PREG, ED    EKG None  Radiology No results found.  Procedures .Critical Care Performed by: Varney Biles, MD Authorized by: Varney Biles, MD   Critical care provider statement:    Critical care time (minutes):  52   Critical care was necessary to treat or prevent imminent or life-threatening deterioration of the following conditions:  Dehydration, circulatory failure and CNS failure or compromise   Critical care was time spent personally by me on the following activities:  Discussions with consultants, evaluation of patient's response to treatment, examination of patient, ordering and performing treatments and interventions, ordering and review of laboratory studies, ordering and review of radiographic studies, pulse oximetry, re-evaluation of patient's condition, obtaining history from patient or surrogate and review  of old charts   (including critical care time)  Medications Ordered in ED Medications  LORazepam (ATIVAN) injection 0-4 mg (2 mg Intravenous Given 01/07/20 0722)    Or  LORazepam (ATIVAN) tablet 0-4 mg ( Oral See Alternative 01/07/20 0722)  LORazepam (ATIVAN) injection 0-4 mg (has no administration in time range)    Or  LORazepam (ATIVAN) tablet 0-4 mg (has no administration in time range)  lactated ringers infusion ( Intravenous New Bag/Given 01/07/20 0803)  LORazepam (ATIVAN) injection 1 mg (has no administration in time range)  dextrose 50 % solution 25 g (has no administration in time range)  magnesium sulfate IVPB 2 g 50 mL (has no administration in time range)  potassium chloride SA (KLOR-CON) CR tablet 40 mEq (has no administration in time range)  potassium chloride 10 mEq in 100 mL IVPB (has no administration in time range)  morphine 4 MG/ML injection 6 mg (has no administration in time range)  pantoprazole (PROTONIX) injection 40 mg (has no administration in time range)  thiamine (B-1) injection 100 mg (100 mg Intravenous Given 01/07/20 0803)  alum & mag hydroxide-simeth (MAALOX/MYLANTA) 200-200-20 MG/5ML suspension 30 mL (30 mLs Oral Given 01/07/20 0836)    And  lidocaine (XYLOCAINE) 2 % viscous mouth solution 15 mL (15 mLs Oral Given 01/07/20 0836)    ED Course  I have reviewed the triage vital signs and the nursing notes.  Pertinent labs & imaging results that were available during my care of the patient were reviewed by me and considered in my medical decision making (see chart for details).    MDM Rules/Calculators/A&P                          44 year old female comes in a chief complaint of abdominal pain, nausea, vomiting, seizure-like activity. She has history of alcoholism and severe alcohol withdrawals before.  Last drink was Sunday morning, 48 hours ago.  She is noted to be tachycardic and dry.  She  is also having tremors.  She is clearly having some alcohol  withdrawal-like symptoms, and the seizures she had likely were alcohol withdrawal seizures.  Appropriate lab work-up has been initiated.  CIWA protocol also started.  She will likely need hydration and admission.  Abdominal exam is not showing any signs of peritonitis.  Previous CT scans and MRCP reviewed.  It appears that patient has known history of pancreatitis and chronic biliary dilatation.  Reassessment: Labs reviewed.  Patient has profound hypokalemia and hypomagnesemia. We will order replenishment of those electrolytes.  IV hydration has already been initiated.  Lipase is elevated at 350.  We will get ultrasound, however it appears that this is a chronic pancreatitis and not acute issue.  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

## 2020-01-07 NOTE — ED Notes (Signed)
Date and time results received: 01/07/20 0904  Test: Potassium & Magnesium Critical Value: Potassium 2.3, Magnesium 0.7  Name of Provider Notified: Dr. Kathrynn Humble  Orders Received? Or Actions Taken?: EDP notified

## 2020-01-07 NOTE — H&P (Signed)
Patient Demographics:    Olivia Lester, is a 44 y.o. female  MRN: [id number]   DOB - 06/08/1976  Admit Date - 01/07/2020  Outpatient Primary MD for the patient is Patient, No Pcp Per   Assessment & Plan:    Principal Problem:   Alcohol withdrawal ([place]) Active Problems:   Alcohol abuse   Polysubstance abuse ([place])   Cannabis abuse   Essential hypertension    Brief Summary: 44 year old female with past medical history relevant for EtOH abuse, tobacco abuse, and THC, and benzo abuse as well as HTN,  history of hep C, anxiety disorder, status post prior ORIF of right ankle, history of alcoholic esophageal gastro duodenitis and chronic alcohol induced thrombocytopenia Patient reports typically drinking an 18 pack or half a gallon of liquor daily--- -- its been a long time since patient went a couple of days without drinking-- --patient is not interested in alcohol rehab or drug rehab   A/p 1)Severe Hypokalemia and Severe Hypomagnesiemia--- suspect due to GI losses in an alcoholic female with some degree of possible pancreatic insufficiency and chronic diarrhea Mag is 0.7, K is 2.3 -Replace IV until tolerating oral intake better  2) acute on chronic pancreatitis with abdominal pain and Nausea--- -Lipase is 334 -Secondary to alcoholic induced gastritis, possible Colitis -pt also likely has component of cannabis hyperemesis syndrome -Bowel rest, as needed antiemetics -Advance diet slowly as tolerated -Give Creon for acute on chronic pancreatitis  3)Polysubstance Abuse ---Pt uses Tobacco, Benzo, Alcohol, opioids, marijuana --Patient repeatedly declines rehab   4)Tobacco abuse - use Nicotine patch  5)Alcohol Abuse with alcohol withdrawal symptoms---?? seizure ----Patient states she is not really ready to  quit drinking now -She lives with her significant other who drinks heavily too -Lorazepam per CIWA protocol, folic acid and thiamine as ordered   6)Chronic Thrombocytopenia -Platelets is 98,  --from EtOH abuse,   7)Hypertension -not compliant with PTA Amlodipine -Compliance with amlodipine advised  Status is: Inpatient  Remains inpatient appropriate because:IV treatments appropriate due to intensity of illness or inability to take PO and Inpatient level of care appropriate due to severity of illness   Dispo: The patient is from: Home              Anticipated d/c is to: Home              Anticipated d/c date is: 2 days              Patient currently is not medically stable to d/c. Barriers: Not Clinically Stable-alcohol withdrawal symptoms with possible seizures and severe electrolyte abnormalities   With History of - Reviewed by me  Past Medical History:  Diagnosis Date  . Alcohol abuse   . Back injury   . Gastric ulcer   . Gastric ulcer 10/14/2013  . Hepatitis C 10/14/2013  . History of kidney stones   . 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  Pancreatitis, acute   . Panic attack   . Pneumonia   . Polysubstance abuse ([place])   . PONV (postoperative nausea and vomiting)   . Seizures ([place])       Past Surgical History:  Procedure Laterality Date  . BACK SURGERY    . BREAST SURGERY    . ESOPHAGOGASTRODUODENOSCOPY (EGD) WITH PROPOFOL N/A 10/14/2013   Procedure: ESOPHAGOGASTRODUODENOSCOPY (EGD) WITH PROPOFOL;  Surgeon: Rogene Houston, MD;  Location: [facility];  Service: Endoscopy;  Laterality: N/A;  . ORIF TOE FRACTURE Right 08/08/2018   Procedure: RIGHT FOOT INTERNAL FIXATION 5TH METATARSAL;  Surgeon: Newt Minion, MD;  Location: [facility];  Service: Orthopedics;  Laterality: Right;  . TONSILLECTOMY       Chief Complaint  Patient presents with  . Seizures      HPI:    Olivia Lester  is a 44 y.o. female   with past medical history relevant for EtOH abuse,  tobacco abuse, and THC, and benzo abuse as well as hypertension, history of hep C, anxiety disorder, status post prior ORIF of right ankle, history of alcoholic esophageal gastro duodenitis and chronic alcohol induced thrombocytopenia presents to the ED with multiple complaints patient states that she quit drinking around 01/05/2020 unless been having withdrawal symptoms including seizures  -Patient has chronic diarrhea apparently due to pancreatic insufficiency -Denies emesis at this time -- - recurrent falls at home due to alcohol abuse and ED visits with scalp laceration due to falls while drunk  States she  drinks 1/2 gallon  of liquor or 18 beers per day---- its been a long time since patient went a couple of days without drinking  Gallbladder ultrasound reveals gallbladder sludge without stones, patient has of diffuse hepatic steatosis  -Lipase is elevated at 334 -Phosphorus is 3.8 -Magnesium low at 0.7 -Platelet count is 98 -Potassium is 2.3, chloride is 88 and creatinine is 1.0      Review of systems:    In addition to the HPI above,   A full Review of  Systems was done, all other systems reviewed are negative except as noted above in HPI , .    Social History:  Reviewed by me    Social History   Tobacco Use  . Smoking status: Current Every Day Smoker    Packs/day: 1.00    Years: 20.00    Pack years: 20.00    Types: Cigarettes  . Smokeless tobacco: Never Used  Substance Use Topics  . Alcohol use: Yes    Alcohol/week: 18.0 standard drinks    Types: 18 Cans of beer per week    Comment: 1/5 -1/2 gallon of liqour daily or 18 beers daily       Family History :  Reviewed by me    Family History  Problem Relation Age of Onset  . Cancer Mother 21       ovarian   . Hypertension Mother   . Stroke Mother   . Early death Father   . Alcohol abuse Father      Home Medications:   Prior to Admission medications   Medication Sig Start Date End Date Taking?  Authorizing Provider  amLODipine (NORVASC) 5 MG tablet Take 1 tablet (5 mg total) by mouth daily. 10/28/19  Yes Roxan Hockey, MD  diphenhydrAMINE (BENADRYL) 25 mg capsule Take 25 mg by mouth daily as needed for allergies.   Yes [provider]  naproxen sodium (ALEVE) 220 MG tablet Take 440 mg by mouth daily  as needed.   Yes [provider]  pantoprazole (PROTONIX) 40 MG tablet Take 1 tablet (40 mg total) by mouth daily. 10/29/19  Yes Demaryius Imran, MD  folic acid (FOLVITE) 1 MG tablet Take 1 tablet (1 mg total) by mouth daily. Patient not taking: Reported on 01/07/2020 10/29/19   Roxan Hockey, MD  lipase/protease/amylase 24000-76000 units CPEP Take 1 capsule (24,000 Units total) by mouth 3 (three) times daily before meals. Patient not taking: Reported on 01/07/2020 10/29/19   Roxan Hockey, MD  Multiple Vitamin (MULTIVITAMIN WITH MINERALS) TABS tablet Take 1 tablet by mouth every other day.  Patient not taking: Reported on 01/07/2020    [provider]  thiamine 100 MG tablet Take 1 tablet (100 mg total) by mouth daily. Patient not taking: Reported on 01/07/2020 10/29/19   Roxan Hockey, MD     Allergies:     Allergies  Allergen Reactions  . Toradol [Ketorolac Tromethamine] Other (See Comments)    Patient advised not to take due to ulcers  . Asa [Aspirin] Rash     Physical Exam:   Vitals  Blood pressure (!) 139/91, pulse 86, temperature 99.1 F (37.3 C), temperature source Oral, resp. rate 18, height 5\' 7"  (1.702 m), weight 53.5 kg, SpO2 100 %.  Physical Examination: General appearance - alert, well appearing, and in no distress  Mental status - alert, oriented to person, place, and time,  Eyes - sclera anicteric Neck - supple, no JVD elevation , Chest - clear  to auscultation bilaterally, symmetrical air movement,  Heart - S1 and S2 normal, regular  Abdomen - soft, epigastric discomfort without rebound or guarding nondistended,  Neurological -  screening mental status exam normal, neck supple without rigidity, cranial nerves II through XII intact, DTR's normal and symmetric, tremors noted Extremities - no pedal edema noted, intact peripheral pulses  Skin - warm, dry     Data Review:    CBC Recent Labs  Lab 01/07/20 0753  WBC 8.2  HGB 14.9  HCT 42.4  PLT 98*  MCV 100.7*  MCH 35.4*  MCHC 35.1  RDW 13.2  LYMPHSABS 0.6*  MONOABS 0.4  EOSABS 0.0  BASOSABS 0.0   ------------------------------------------------------------------------------------------------------------------  Chemistries  Recent Labs  Lab 01/07/20 0753  NA 136  K 2.3*  CL 88*  CO2 22  GLUCOSE 83  BUN 10  CREATININE 1.05*  CALCIUM 8.8*  MG 0.7*   ------------------------------------------------------------------------------------------------------------------ estimated creatinine clearance is 58.3 mL/min (A) (by C-G formula based on SCr of 1.05 mg/dL (H)). ------------------------------------------------------------------------------------------------------------------ No results for input(s): TSH, T4TOTAL, T3FREE, THYROIDAB in the last 72 hours.  Invalid input(s): FREET3   Coagulation profile No results for input(s): INR, PROTIME in the last 168 hours. ------------------------------------------------------------------------------------------------------------------- No results for input(s): DDIMER in the last 72 hours. -------------------------------------------------------------------------------------------------------------------  Cardiac Enzymes No results for input(s): CKMB, TROPONINI, MYOGLOBIN in the last 168 hours.  Invalid input(s): CK ------------------------------------------------------------------------------------------------------------------ No results found for: BNP   ---------------------------------------------------------------------------------------------------------------  Urinalysis    Component Value  Date/Time   COLORURINE AMBER (A) 01/07/2020 1051   APPEARANCEUR CLOUDY (A) 01/07/2020 1051   APPEARANCEUR Clear 12/07/2013 0133   LABSPEC 1.029 01/07/2020 1051   LABSPEC 1.006 12/07/2013 0133   PHURINE 7.0 01/07/2020 1051   GLUCOSEU NEGATIVE 01/07/2020 1051   GLUCOSEU Negative 12/07/2013 0133   HGBUR MODERATE (A) 01/07/2020 1051   BILIRUBINUR MODERATE (A) 01/07/2020 1051   BILIRUBINUR Negative 12/07/2013 0133   KETONESUR 20 (A) 01/07/2020 1051   PROTEINUR 100 (A) 01/07/2020 1051  UROBILINOGEN 0.2 04/28/2015 1559   NITRITE NEGATIVE 01/07/2020 1051   LEUKOCYTESUR NEGATIVE 01/07/2020 1051   LEUKOCYTESUR Negative 12/07/2013 0133    ----------------------------------------------------------------------------------------------------------------   Imaging Results:    US Abdomen Limited RUQ  Result Date: 01/07/2020 CLINICAL DATA:  Mid to upper abdominal pain with vomiting for 1 day. EXAM: ULTRASOUND ABDOMEN LIMITED RIGHT UPPER QUADRANT COMPARISON:  10/28/2019 CT abdomen/pelvis. FINDINGS: Gallbladder: Layering sludge in the nondistended gallbladder. No shadowing gallstones. No gallbladder wall thickening. No pericholecystic fluid. No sonographic Murphy sign. Common bile duct: Diameter: 6 mm Liver: Liver parenchyma is diffusely echogenic with posterior acoustic attenuation, in keeping with diffuse hepatic steatosis. No definite liver surface irregularity. No liver masses, noting decreased sensitivity in the setting of an echogenic liver. Portal vein is patent on color Doppler imaging with normal direction of blood flow towards the liver. Other: None. IMPRESSION: 1. Gallbladder sludge.  No cholelithiasis.  No acute cholecystitis. 2. Top normal caliber common bile duct, 6 mm diameter. 3. Diffuse hepatic steatosis. Electronically Signed   By: Ilona Sorrel M.D.   On: 01/07/2020 10:08    Radiological Exams on Admission: US Abdomen Limited RUQ  Result Date: 01/07/2020 CLINICAL DATA:  Mid to upper  abdominal pain with vomiting for 1 day. EXAM: ULTRASOUND ABDOMEN LIMITED RIGHT UPPER QUADRANT COMPARISON:  10/28/2019 CT abdomen/pelvis. FINDINGS: Gallbladder: Layering sludge in the nondistended gallbladder. No shadowing gallstones. No gallbladder wall thickening. No pericholecystic fluid. No sonographic Murphy sign. Common bile duct: Diameter: 6 mm Liver: Liver parenchyma is diffusely echogenic with posterior acoustic attenuation, in keeping with diffuse hepatic steatosis. No definite liver surface irregularity. No liver masses, noting decreased sensitivity in the setting of an echogenic liver. Portal vein is patent on color Doppler imaging with normal direction of blood flow towards the liver. Other: None. IMPRESSION: 1. Gallbladder sludge.  No cholelithiasis.  No acute cholecystitis. 2. Top normal caliber common bile duct, 6 mm diameter. 3. Diffuse hepatic steatosis. Electronically Signed   By: Ilona Sorrel M.D.   On: 01/07/2020 10:08    DVT Prophylaxis -SCD  AM Labs Ordered, also please review Full Orders  Family Communication: Admission, patients condition and plan of care including tests being ordered have been discussed with the patient who indicate understanding and agree with the plan   Code Status - Full Code  Likely DC to home after resolution of alcohol withdrawal symptoms and tolerance of oral intake  Condition   stable*  Roxan Hockey M.D on 01/07/2020 at 7:17 PM Go to [email or web address] -  for contact info  [facility] - Office  [phone]

## 2020-01-07 NOTE — ED Triage Notes (Signed)
Pt reports drinks daily.  Last etoh was Sunday.  Pt has tremors, reports has had 3 seizures in the past 24 hours.  EDP notified and instructed to medicate based off CIWA score.  PT also c/o abd pain and n/v.

## 2020-01-07 NOTE — ED Notes (Signed)
Date and time results received: 01/07/20 01/07/20 1000  Test: Lactic Acid  Critical Value: 2.0  Name of Provider Notified: Dr. Denton Brick  Orders Received? Or Actions Taken?: MD notified

## 2020-01-07 NOTE — ED Notes (Signed)
ED Provider at bedside. 

## 2020-01-07 NOTE — Progress Notes (Signed)
Patient requested "I need my ativan." tremors noticeable, appeared anxious. CIWA score 8. See CIWA flowsheet. Notified Dr. Denton Brick. Patient seen by MD, ativan to be given at next scheduled time. No new orders at that time. On reassessment, patient had no tremors noted, verbalized understanding of next time ativan was due. Night shift RN aware to monitor.

## 2020-01-08 LAB — COMPREHENSIVE METABOLIC PANEL
ALT: 49 U/L — ABNORMAL HIGH (ref 0–44)
AST: 120 U/L — ABNORMAL HIGH (ref 15–41)
Albumin: 3.6 g/dL (ref 3.5–5.0)
Alkaline Phosphatase: 104 U/L (ref 38–126)
Anion gap: 15 (ref 5–15)
BUN: 7 mg/dL (ref 6–20)
CO2: 22 mmol/L (ref 22–32)
Calcium: 8.8 mg/dL — ABNORMAL LOW (ref 8.9–10.3)
Chloride: 95 mmol/L — ABNORMAL LOW (ref 98–111)
Creatinine, Ser: 0.46 mg/dL (ref 0.44–1.00)
GFR calc Af Amer: >60 mL/min (ref >60)
GFR calc non Af Amer: >60 mL/min (ref >60)
Glucose, Bld: 60 mg/dL — ABNORMAL LOW (ref 70–99)
Potassium: 4.4 mmol/L (ref 3.5–5.1)
Sodium: 132 mmol/L — ABNORMAL LOW (ref 135–145)
Total Bilirubin: 1.9 mg/dL — ABNORMAL HIGH (ref 0.3–1.2)
Total Protein: 6.9 g/dL (ref 6.5–8.1)

## 2020-01-08 LAB — CBC
HCT: 37.2 % (ref 36.0–46.0)
Hemoglobin: 12.6 g/dL (ref 12.0–15.0)
MCH: 35.6 pg — ABNORMAL HIGH (ref 26.0–34.0)
MCHC: 33.9 g/dL (ref 30.0–36.0)
MCV: 105.1 fL — ABNORMAL HIGH (ref 80.0–100.0)
Platelets: 65 10*3/uL — ABNORMAL LOW (ref 150–400)
RBC: 3.54 MIL/uL — ABNORMAL LOW (ref 3.87–5.11)
RDW: 13.3 % (ref 11.5–15.5)
WBC: 7.1 10*3/uL (ref 4.0–10.5)
nRBC: 0 % (ref 0.0–0.2)

## 2020-01-08 MED ORDER — HYDROXYZINE HCL 25 MG PO TABS
25.0000 mg | ORAL_TABLET | Freq: Three times a day (TID) | ORAL | Status: DC
  Administered 2020-01-08: 25 mg via ORAL
  Filled 2020-01-08: qty 1

## 2020-01-08 MED ORDER — LORAZEPAM 2 MG/ML IJ SOLN
2.0000 mg | Freq: Once | INTRAMUSCULAR | Status: AC
  Administered 2020-01-08: 2 mg via INTRAVENOUS

## 2020-01-08 MED ORDER — DIAZEPAM 2 MG PO TABS: 2.0000 mg | ORAL_TABLET | Freq: Three times a day (TID) | ORAL | Status: DC

## 2020-01-08 NOTE — Discharge Summary (Signed)
Olivia Lester, is a 44 y.o. female  DOB September 01, 1975  MRN [id number].  Admission date:  01/07/2020  Admitting Physician  Roxan Hockey, MD  Discharge Date:  01/08/2020   Primary MD  Patient, No Pcp Per  Recommendations for primary care physician for things to follow:    --Patient Absconded from the floor/unit--- went AWOL apparently to go smoke down stairs outside the hospital -Despite repeated conversations patient wanted to leave the hospital -I called and discussed case with patient's boyfriend Mr. Carrolyn Leigh--- who stated that is okay for patient to leave, apparently a friend Simona Huh will pick patient up--- as Dominica Severin is currently at work -Patient left [place]  Admission Diagnosis  Alcohol withdrawal ([place]) [F10.239] Acute hypokalemia [E87.6] Hypomagnesemia [E83.42] Seizure ([place]) [R56.9] Alcohol-induced chronic pancreatitis ([place]) [K86.0] Abdominal pain, RUQ [R10.11] Severe alcohol withdrawal without perceptual disturbances with complication [facility]) [D22.025]   Discharge Diagnosis  Alcohol withdrawal ([place]) [F10.239] Acute hypokalemia [E87.6] Hypomagnesemia [E83.42] Seizure ([place]) [R56.9] Alcohol-induced chronic pancreatitis ([place]) [K86.0] Abdominal pain, RUQ [R10.11] Severe alcohol withdrawal without perceptual disturbances with complication ([place]) [K27.062]    Principal Problem:   Alcohol withdrawal ([place]) Active Problems:   Alcohol abuse   Polysubstance abuse ([place])   Cannabis abuse   Essential hypertension      Past Medical History:  Diagnosis Date  . Alcohol abuse   . Back injury   . Gastric ulcer   . Gastric ulcer 10/14/2013  . Hepatitis C 10/14/2013  . History of kidney stones   . 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 Pancreatitis, acute   . Panic attack   . Pneumonia   . Polysubstance abuse ([place])   . PONV (postoperative nausea and vomiting)   . Seizures  ([place])     Past Surgical History:  Procedure Laterality Date  . BACK SURGERY    . BREAST SURGERY    . ESOPHAGOGASTRODUODENOSCOPY (EGD) WITH PROPOFOL N/A 10/14/2013   Procedure: ESOPHAGOGASTRODUODENOSCOPY (EGD) WITH PROPOFOL;  Surgeon: Rogene Houston, MD;  Location: [facility];  Service: Endoscopy;  Laterality: N/A;  . ORIF TOE FRACTURE Right 08/08/2018   Procedure: RIGHT FOOT INTERNAL FIXATION 5TH METATARSAL;  Surgeon: Newt Minion, MD;  Location: [place];  Service: Orthopedics;  Laterality: Right;  . TONSILLECTOMY         HPI  from the history and physical done on the day of admission:    Olivia Lester  is a 44 y.o. female  with past medical history relevant for EtOH abuse, tobacco abuse, and THC, and benzo abuse as well as hypertension,history of hep C,anxiety disorder, status post prior ORIF of right ankle, history of alcoholic esophageal gastro duodenitis and chronic alcohol induced thrombocytopenia presents to the ED with multiple complaints patient states that she quit drinking around 01/05/2020 unless been having withdrawal symptoms including seizures  -Patient has chronic diarrhea apparently due to pancreatic insufficiency -Denies emesis at this time -- - recurrent falls at home due to alcohol abuse and ED visits with  scalp laceration due to falls while drunk  States she  drinks 1/2 gallonof liquor or 18 beers per day---- its been a long time since patient went a couple of days without drinking  Gallbladder ultrasound reveals gallbladder sludge without stones, patient has of diffuse hepatic steatosis  -Lipase is elevated at 334 -Phosphorus is 3.8 -Magnesium low at 0.7 -Platelet count is 98 -Potassium is 2.3, chloride is 88 and creatinine is 1.0     Hospital Course:     Brief Summary: 44 year old femalewith past medical history relevant for EtOH abuse, tobacco abuse, and THC, and benzo abuse as well asHTN,history of hep C,anxiety disorder, status post prior ORIF of  right ankle, history of alcoholic esophageal gastro duodenitis and chronic alcohol induced thrombocytopenia Patient reports typically drinking an 18 pack or half a gallon of liquor daily--- -- its been a long time since patient went a couple of days without drinking-- --patient is not interested in alcohol rehab or drug rehab --Patient left AGAINST MEDICAL ADVICE   A/p 1)Severe Hypokalemia and Severe Hypomagnesiemia---suspect due to GI losses in an alcoholic female with some degree of possible pancreatic insufficiency and chronic diarrhea Mag is 0.7, K is 2.3 -Potassium was up to 4.4 after replacement ---Patient Absconded from the floor/unit--- went AWOL apparently to go smoke down stairs outside the hospital -Despite repeated conversations patient wanted to leave the hospital -I called and discussed case with patient's boyfriend Mr. Carrolyn Leigh--- who stated that is okay for patient to leave, apparently a friend Simona Huh will pick patient up--- as Dominica Severin is currently at work -Patient left [place]  2)Acute on chronic pancreatitis with abdominal pain andNausea--- -Lipase is 334 -Secondary to alcoholic induced gastritis, possible Colitis -pt also likely has component of cannabis hyperemesis syndrome -Bowel rest, as needed antiemetics -Advised to use Creon -Patient left [facility]  3)PolysubstanceAbuse ---Pt uses Tobacco,Benzo, Alcohol, opioids, marijuana --Patient repeatedly declines rehab  --Patient left AGAINST MEDICAL ADVICE  4)Tobacco abuse -useNicotinepatch  5)Alcohol Abusewith alcohol withdrawal symptoms---?? seizure ----Patient states she is not really ready to quit drinking now -She lives with her significant other who drinks heavily too --Patient left [place]  6)ChronicThrombocytopenia --from EtOH abuse,   7)Hypertension -notcompliant withPTAAmlodipine -Compliance with amlodipine advised    Disposition: - --Patient  Absconded from the floor/unit--- went AWOL apparently to go smoke down stairs outside the hospital -Despite repeated conversations patient wanted to leave the hospital -I called and discussed case with patient's boyfriend Mr. Carrolyn Leigh--- who stated that is okay for patient to leave, apparently a friend Simona Huh will pick patient up--- as Dominica Severin is currently at work -Patient left [place]   Discharge Condition: -Patient left AGAINST MEDICAL ADVICE  Follow UP-----Patient left AGAINST MEDICAL ADVICE   Consults obtained -   Diet and Activity recommendation:  As advised  Discharge Instructions  --- -Patient left AGAINST MEDICAL ADVICE    Discharge Medications    Major procedures and Radiology Reports - PLEASE review detailed and final reports for all details, in brief -   US Abdomen Limited RUQ  Result Date: 01/07/2020 CLINICAL DATA:  Mid to upper abdominal pain with vomiting for 1 day. EXAM: ULTRASOUND ABDOMEN LIMITED RIGHT UPPER QUADRANT COMPARISON:  10/28/2019 CT abdomen/pelvis. FINDINGS: Gallbladder: Layering sludge in the nondistended gallbladder. No shadowing gallstones. No gallbladder wall thickening. No pericholecystic fluid. No sonographic Murphy sign. Common bile duct: Diameter: 6 mm Liver: Liver parenchyma is diffusely echogenic with posterior acoustic attenuation, in keeping with diffuse hepatic steatosis.  No definite liver surface irregularity. No liver masses, noting decreased sensitivity in the setting of an echogenic liver. Portal vein is patent on color Doppler imaging with normal direction of blood flow towards the liver. Other: None. IMPRESSION: 1. Gallbladder sludge.  No cholelithiasis.  No acute cholecystitis. 2. Top normal caliber common bile duct, 6 mm diameter. 3. Diffuse hepatic steatosis. Electronically Signed   By: Ilona Sorrel M.D.   On: 01/07/2020 10:08    Micro Results   Recent Results (from the past 240 hour(s))  SARS Coronavirus 2 by RT PCR  (hospital order, performed in [facility] hospital lab) Nasopharyngeal Nasopharyngeal Swab     Status: None   Collection Time: 01/07/20  8:47 AM   Specimen: Nasopharyngeal Swab  Result Value Ref Range Status   SARS Coronavirus 2 NEGATIVE NEGATIVE Final    Comment: (NOTE) SARS-CoV-2 target nucleic acids are NOT DETECTED.  The SARS-CoV-2 RNA is generally detectable in upper and lower respiratory specimens during the acute phase of infection. The lowest concentration of SARS-CoV-2 viral copies this assay can detect is 250 copies / mL. A negative result does not preclude SARS-CoV-2 infection and should not be used as the sole basis for treatment or other patient management decisions.  A negative result may occur with improper specimen collection / handling, submission of specimen other than nasopharyngeal swab, presence of viral mutation(s) within the areas targeted by this assay, and inadequate number of viral copies (<250 copies / mL). A negative result must be combined with clinical observations, patient history, and epidemiological information.  Fact Sheet for Patients:   StrictlyIdeas.no  Fact Sheet for Healthcare Providers: BankingDealers.co.za  This test is not yet approved or  cleared by the [place] FDA and has been authorized for detection and/or diagnosis of SARS-CoV-2 by FDA under an Emergency Use Authorization (EUA).  This EUA will remain in effect (meaning this test can be used) for the duration of the COVID-19 declaration under Section 564(b)(1) of the Act, 21 U.S.C. section 360bbb-3(b)(1), unless the authorization is terminated or revoked sooner.  Performed at [facility], [address] [place], [place] [address]     Today   Subjective    Quanetta Peterka today--Patient left AGAINST MEDICAL ADVICE          Patient has been seen and examined prior to discharge   Objective   Blood pressure (!) 140/106, pulse 96,  temperature 99.1 F (37.3 C), temperature source Oral, resp. rate 20, height 5\' 7"  (1.702 m), weight 53.5 kg, SpO2 100 %.   Intake/Output Summary (Last 24 hours) at 01/08/2020 1339 Last data filed at 01/07/2020 1648 Gross per 24 hour  Intake 408.29 ml  Output --  Net 408.29 ml   Exam Gen:- Awake Alert, no acute distress  HEENT:- [place].AT, No sclera icterus Neck-Supple Neck,No JVD,.  Lungs-  CTAB , good air movement bilaterally  CV- S1, S2 normal, regular Abd-  +ve B.Sounds, Abd Soft, No tenderness,    Extremity/Skin:- No  edema,   good pulses Psych-affect is slightly anxious, oriented x3, patient is coherent and argumentative wanting to leave [place] [facility]-no new focal deficits,    Data Review   CBC w Diff:  Lab Results  Component Value Date   WBC 7.1 01/08/2020   HGB 12.6 01/08/2020   HGB 14.2 12/07/2013   HCT 37.2 01/08/2020   HCT 41.6 12/07/2013   PLT 65 (L) 01/08/2020   PLT 155 12/07/2013   LYMPHOPCT 8 01/07/2020   LYMPHOPCT  55.1 12/07/2013   MONOPCT 5 01/07/2020   MONOPCT 3.9 12/07/2013   EOSPCT 0 01/07/2020   EOSPCT 1.1 12/07/2013   BASOPCT 0 01/07/2020   BASOPCT 1.3 12/07/2013    CMP:  Lab Results  Component Value Date   NA 132 (L) 01/08/2020   NA 141 08/30/2017   NA 142 12/07/2013   K 4.4 01/08/2020   K 3.7 12/07/2013   CL 95 (L) 01/08/2020   CL 107 12/07/2013   CO2 22 01/08/2020   CO2 25 12/07/2013   BUN 7 01/08/2020   BUN 8 08/30/2017   BUN 7 12/07/2013   CREATININE 0.46 01/08/2020   CREATININE 0.46 (L) 12/07/2013   PROT 6.9 01/08/2020   PROT 7.3 08/30/2017   PROT 8.9 (H) 12/07/2013   ALBUMIN 3.6 01/08/2020   ALBUMIN 4.7 08/30/2017   ALBUMIN 4.6 12/07/2013   BILITOT 1.9 (H) 01/08/2020   BILITOT 0.2 08/30/2017   BILITOT 0.5 12/07/2013   ALKPHOS 104 01/08/2020   ALKPHOS 96 12/07/2013   AST 120 (H) 01/08/2020   AST 365 (H) 12/07/2013   ALT 49 (H) 01/08/2020   ALT 284 (H) 12/07/2013  .   Total Discharge time is about 33  minutes  Roxan Hockey M.D on 01/08/2020 at 1:39 PM  Go to [email or web address] -  for contact info  [facility] - Office  [phone]

## 2020-01-08 NOTE — Plan of Care (Signed)

## 2020-01-26 ENCOUNTER — Emergency Department (HOSPITAL_COMMUNITY): Payer: Self-pay

## 2020-01-26 ENCOUNTER — Inpatient Hospital Stay (HOSPITAL_COMMUNITY)
Admission: EM | Admit: 2020-01-26 | Discharge: 2020-01-29 | DRG: 439 | Discharge status: Against Medical Advice | Payer: Self-pay | Attending: Family Medicine | Admitting: Family Medicine

## 2020-01-26 ENCOUNTER — Encounter (HOSPITAL_COMMUNITY): Payer: Self-pay | Admitting: *Deleted

## 2020-01-26 ENCOUNTER — Other Ambulatory Visit: Payer: Self-pay

## 2020-01-26 DIAGNOSIS — F17210 Nicotine dependence, cigarettes, uncomplicated: Secondary | ICD-10-CM | POA: Diagnosis present

## 2020-01-26 DIAGNOSIS — D6959 Other secondary thrombocytopenia: Secondary | ICD-10-CM | POA: Diagnosis present

## 2020-01-26 DIAGNOSIS — IMO0002 Reserved for concepts with insufficient information to code with codable children: Secondary | ICD-10-CM | POA: Diagnosis present

## 2020-01-26 DIAGNOSIS — K852 Alcohol induced acute pancreatitis without necrosis or infection: Principal | ICD-10-CM | POA: Diagnosis present

## 2020-01-26 DIAGNOSIS — F10230 Alcohol dependence with withdrawal, uncomplicated: Secondary | ICD-10-CM

## 2020-01-26 DIAGNOSIS — F1110 Opioid abuse, uncomplicated: Secondary | ICD-10-CM | POA: Diagnosis present

## 2020-01-26 DIAGNOSIS — F410 Panic disorder [episodic paroxysmal anxiety] without agoraphobia: Secondary | ICD-10-CM | POA: Diagnosis present

## 2020-01-26 DIAGNOSIS — E876 Hypokalemia: Secondary | ICD-10-CM | POA: Diagnosis present

## 2020-01-26 DIAGNOSIS — K7010 Alcoholic hepatitis without ascites: Secondary | ICD-10-CM | POA: Diagnosis present

## 2020-01-26 DIAGNOSIS — K2920 Alcoholic gastritis without bleeding: Secondary | ICD-10-CM | POA: Diagnosis present

## 2020-01-26 DIAGNOSIS — Z20822 Contact with and (suspected) exposure to covid-19: Secondary | ICD-10-CM | POA: Diagnosis present

## 2020-01-26 DIAGNOSIS — K700 Alcoholic fatty liver: Secondary | ICD-10-CM | POA: Diagnosis present

## 2020-01-26 DIAGNOSIS — F10239 Alcohol dependence with withdrawal, unspecified: Secondary | ICD-10-CM | POA: Diagnosis present

## 2020-01-26 DIAGNOSIS — F1210 Cannabis abuse, uncomplicated: Secondary | ICD-10-CM | POA: Diagnosis present

## 2020-01-26 DIAGNOSIS — F1020 Alcohol dependence, uncomplicated: Secondary | ICD-10-CM

## 2020-01-26 DIAGNOSIS — F1310 Sedative, hypnotic or anxiolytic abuse, uncomplicated: Secondary | ICD-10-CM | POA: Diagnosis present

## 2020-01-26 DIAGNOSIS — K859 Acute pancreatitis without necrosis or infection, unspecified: Secondary | ICD-10-CM

## 2020-01-26 DIAGNOSIS — I10 Essential (primary) hypertension: Secondary | ICD-10-CM | POA: Diagnosis present

## 2020-01-26 DIAGNOSIS — F10939 Alcohol use, unspecified with withdrawal, unspecified: Secondary | ICD-10-CM | POA: Diagnosis present

## 2020-01-26 LAB — CBC WITH DIFFERENTIAL/PLATELET
Abs Immature Granulocytes: 0.07 10*3/uL (ref 0.00–0.07)
Basophils Absolute: 0.1 10*3/uL (ref 0.0–0.1)
Basophils Relative: 1 %
Eosinophils Absolute: 0 10*3/uL (ref 0.0–0.5)
Eosinophils Relative: 0 %
HCT: 45.5 % (ref 36.0–46.0)
Hemoglobin: 15 g/dL (ref 12.0–15.0)
Immature Granulocytes: 0 %
Lymphocytes Relative: 7 %
Lymphs Abs: 1.4 10*3/uL (ref 0.7–4.0)
MCH: 34.9 pg — ABNORMAL HIGH (ref 26.0–34.0)
MCHC: 33 g/dL (ref 30.0–36.0)
MCV: 105.8 fL — ABNORMAL HIGH (ref 80.0–100.0)
Monocytes Absolute: 0.7 10*3/uL (ref 0.1–1.0)
Monocytes Relative: 4 %
Neutro Abs: 17.7 10*3/uL — ABNORMAL HIGH (ref 1.7–7.7)
Neutrophils Relative %: 88 %
Platelets: 202 10*3/uL (ref 150–400)
RBC: 4.3 MIL/uL (ref 3.87–5.11)
RDW: 13.7 % (ref 11.5–15.5)
WBC: 20 10*3/uL — ABNORMAL HIGH (ref 4.0–10.5)
nRBC: 0 % (ref 0.0–0.2)

## 2020-01-26 LAB — COMPREHENSIVE METABOLIC PANEL
ALT: 59 U/L — ABNORMAL HIGH (ref 0–44)
AST: 107 U/L — ABNORMAL HIGH (ref 15–41)
Albumin: 4.4 g/dL (ref 3.5–5.0)
Alkaline Phosphatase: 103 U/L (ref 38–126)
Anion gap: 27 — ABNORMAL HIGH (ref 5–15)
BUN: 6 mg/dL (ref 6–20)
CO2: 18 mmol/L — ABNORMAL LOW (ref 22–32)
Calcium: 9.3 mg/dL (ref 8.9–10.3)
Chloride: 98 mmol/L (ref 98–111)
Creatinine, Ser: 0.53 mg/dL (ref 0.44–1.00)
GFR calc Af Amer: >60 mL/min (ref >60)
GFR calc non Af Amer: >60 mL/min (ref >60)
Glucose, Bld: 100 mg/dL — ABNORMAL HIGH (ref 70–99)
Potassium: 3.4 mmol/L — ABNORMAL LOW (ref 3.5–5.1)
Sodium: 143 mmol/L (ref 135–145)
Total Bilirubin: 0.7 mg/dL (ref 0.3–1.2)
Total Protein: 8.2 g/dL — ABNORMAL HIGH (ref 6.5–8.1)

## 2020-01-26 LAB — URINALYSIS, ROUTINE W REFLEX MICROSCOPIC
Bilirubin Urine: NEGATIVE
Glucose, UA: NEGATIVE mg/dL
Hgb urine dipstick: NEGATIVE
Ketones, ur: 20 mg/dL — AB
Leukocytes,Ua: NEGATIVE
Nitrite: NEGATIVE
Protein, ur: NEGATIVE mg/dL
Specific Gravity, Urine: 1.011 (ref 1.005–1.030)
pH: 6 (ref 5.0–8.0)

## 2020-01-26 LAB — MAGNESIUM: Magnesium: 1.2 mg/dL — ABNORMAL LOW (ref 1.7–2.4)

## 2020-01-26 LAB — PREGNANCY, URINE: Preg Test, Ur: NEGATIVE

## 2020-01-26 LAB — TROPONIN I (HIGH SENSITIVITY)
Troponin I (High Sensitivity): 7 ng/L (ref <18)
Troponin I (High Sensitivity): 10 ng/L (ref <18)

## 2020-01-26 LAB — SARS CORONAVIRUS 2 BY RT PCR (HOSPITAL ORDER, PERFORMED IN ~~LOC~~ HOSPITAL LAB): SARS Coronavirus 2: NEGATIVE

## 2020-01-26 LAB — ETHANOL: Alcohol, Ethyl (B): 27 mg/dL — ABNORMAL HIGH (ref <10)

## 2020-01-26 LAB — MRSA PCR SCREENING: MRSA by PCR: NEGATIVE

## 2020-01-26 LAB — LIPASE, BLOOD: Lipase: 1379 U/L — ABNORMAL HIGH (ref 11–51)

## 2020-01-26 MED ORDER — SODIUM CHLORIDE 0.9 % IV BOLUS
1000.0000 mL | Freq: Once | INTRAVENOUS | Status: AC
  Administered 2020-01-26: 1000 mL via INTRAVENOUS

## 2020-01-26 MED ORDER — MAGNESIUM SULFATE 2 GM/50ML IV SOLN
2.0000 g | Freq: Once | INTRAVENOUS | Status: AC
  Administered 2020-01-26: 2 g via INTRAVENOUS
  Filled 2020-01-26: qty 50

## 2020-01-26 MED ORDER — THIAMINE HCL 100 MG PO TABS
100.0000 mg | ORAL_TABLET | Freq: Every day | ORAL | Status: DC
  Administered 2020-01-27 – 2020-01-29 (×3): 100 mg via ORAL
  Filled 2020-01-26 (×3): qty 1

## 2020-01-26 MED ORDER — ACETAMINOPHEN 650 MG RE SUPP: 650.0000 mg | Freq: Four times a day (QID) | RECTAL | Status: DC | PRN

## 2020-01-26 MED ORDER — FENTANYL CITRATE (PF) 100 MCG/2ML IJ SOLN
50.0000 ug | Freq: Once | INTRAMUSCULAR | Status: AC
  Administered 2020-01-26: 50 ug via INTRAVENOUS
  Filled 2020-01-26: qty 2

## 2020-01-26 MED ORDER — MORPHINE SULFATE (PF) 2 MG/ML IV SOLN
2.0000 mg | INTRAVENOUS | Status: DC | PRN
  Administered 2020-01-26 – 2020-01-28 (×16): 2 mg via INTRAVENOUS
  Filled 2020-01-26 (×16): qty 1

## 2020-01-26 MED ORDER — ADULT MULTIVITAMIN W/MINERALS CH
1.0000 | ORAL_TABLET | Freq: Every day | ORAL | Status: DC
  Administered 2020-01-26 – 2020-01-29 (×4): 1 via ORAL
  Filled 2020-01-26 (×4): qty 1

## 2020-01-26 MED ORDER — FOLIC ACID 1 MG PO TABS
1.0000 mg | ORAL_TABLET | Freq: Every day | ORAL | Status: DC
  Administered 2020-01-26 – 2020-01-29 (×4): 1 mg via ORAL
  Filled 2020-01-26 (×4): qty 1

## 2020-01-26 MED ORDER — AMLODIPINE BESYLATE 5 MG PO TABS
10.0000 mg | ORAL_TABLET | Freq: Once | ORAL | Status: AC
  Administered 2020-01-26: 10 mg via ORAL
  Filled 2020-01-26: qty 2

## 2020-01-26 MED ORDER — DEXTROSE-NACL 5-0.45 % IV SOLN: INTRAVENOUS | Status: AC

## 2020-01-26 MED ORDER — ENOXAPARIN SODIUM 40 MG/0.4ML ~~LOC~~ SOLN
40.0000 mg | SUBCUTANEOUS | Status: DC
  Administered 2020-01-26 – 2020-01-28 (×3): 40 mg via SUBCUTANEOUS
  Filled 2020-01-26 (×3): qty 0.4

## 2020-01-26 MED ORDER — LORAZEPAM 2 MG/ML IJ SOLN
1.0000 mg | Freq: Once | INTRAMUSCULAR | Status: AC
  Administered 2020-01-26: 1 mg via INTRAVENOUS
  Filled 2020-01-26: qty 1

## 2020-01-26 MED ORDER — PANTOPRAZOLE SODIUM 40 MG PO TBEC
40.0000 mg | DELAYED_RELEASE_TABLET | Freq: Every day | ORAL | Status: DC
  Administered 2020-01-26 – 2020-01-29 (×4): 40 mg via ORAL
  Filled 2020-01-26 (×5): qty 1

## 2020-01-26 MED ORDER — LORAZEPAM 2 MG/ML IJ SOLN
0.0000 mg | Freq: Two times a day (BID) | INTRAMUSCULAR | Status: DC
  Administered 2020-01-28 – 2020-01-29 (×2): 2 mg via INTRAVENOUS
  Filled 2020-01-26 (×2): qty 1

## 2020-01-26 MED ORDER — MAGNESIUM OXIDE 400 (241.3 MG) MG PO TABS
400.0000 mg | ORAL_TABLET | Freq: Every day | ORAL | Status: AC
  Administered 2020-01-26 – 2020-01-29 (×4): 400 mg via ORAL
  Filled 2020-01-26 (×4): qty 1

## 2020-01-26 MED ORDER — THIAMINE HCL 100 MG PO TABS
100.0000 mg | ORAL_TABLET | Freq: Once | ORAL | Status: AC
  Administered 2020-01-26: 100 mg via ORAL
  Filled 2020-01-26: qty 1

## 2020-01-26 MED ORDER — CHLORHEXIDINE GLUCONATE CLOTH 2 % EX PADS
6.0000 | MEDICATED_PAD | Freq: Every day | CUTANEOUS | Status: DC
  Administered 2020-01-26 – 2020-01-28 (×2): 6 via TOPICAL

## 2020-01-26 MED ORDER — NICOTINE 21 MG/24HR TD PT24
21.0000 mg | MEDICATED_PATCH | Freq: Every day | TRANSDERMAL | Status: DC
  Administered 2020-01-26 – 2020-01-29 (×4): 21 mg via TRANSDERMAL
  Filled 2020-01-26 (×4): qty 1

## 2020-01-26 MED ORDER — ONDANSETRON HCL 4 MG/2ML IJ SOLN: 4.0000 mg | Freq: Once | INTRAMUSCULAR | Status: AC

## 2020-01-26 MED ORDER — PANCRELIPASE (LIP-PROT-AMYL) 12000-38000 UNITS PO CPEP
12000.0000 [IU] | ORAL_CAPSULE | Freq: Three times a day (TID) | ORAL | Status: AC
  Administered 2020-01-26 – 2020-01-27 (×4): 12000 [IU] via ORAL
  Filled 2020-01-26 (×4): qty 1

## 2020-01-26 MED ORDER — ONDANSETRON HCL 4 MG/2ML IJ SOLN
INTRAMUSCULAR | Status: AC
  Administered 2020-01-26: 4 mg via INTRAVENOUS
  Filled 2020-01-26: qty 2

## 2020-01-26 MED ORDER — IOHEXOL 300 MG/ML  SOLN
100.0000 mL | Freq: Once | INTRAMUSCULAR | Status: AC | PRN
  Administered 2020-01-26: 100 mL via INTRAVENOUS

## 2020-01-26 MED ORDER — THIAMINE HCL 100 MG PO TABS: 100.0000 mg | ORAL_TABLET | Freq: Every day | ORAL | Status: DC

## 2020-01-26 MED ORDER — LORAZEPAM 1 MG PO TABS
1.0000 mg | ORAL_TABLET | ORAL | Status: DC | PRN
  Administered 2020-01-28: 2 mg via ORAL
  Administered 2020-01-28: 1 mg via ORAL
  Filled 2020-01-26: qty 2
  Filled 2020-01-26: qty 1

## 2020-01-26 MED ORDER — LORAZEPAM 2 MG/ML IJ SOLN
0.0000 mg | Freq: Four times a day (QID) | INTRAMUSCULAR | Status: AC
  Administered 2020-01-26 – 2020-01-27 (×2): 2 mg via INTRAVENOUS
  Administered 2020-01-27 – 2020-01-28 (×3): 1 mg via INTRAVENOUS
  Administered 2020-01-28: 2 mg via INTRAVENOUS
  Filled 2020-01-26 (×6): qty 1

## 2020-01-26 MED ORDER — LORAZEPAM 2 MG/ML IJ SOLN
1.0000 mg | INTRAMUSCULAR | Status: DC | PRN
  Administered 2020-01-26 – 2020-01-27 (×3): 1 mg via INTRAVENOUS
  Administered 2020-01-28 – 2020-01-29 (×4): 2 mg via INTRAVENOUS
  Filled 2020-01-26 (×7): qty 1

## 2020-01-26 MED ORDER — ONDANSETRON HCL 4 MG/2ML IJ SOLN
4.0000 mg | Freq: Four times a day (QID) | INTRAMUSCULAR | Status: DC | PRN
  Administered 2020-01-26 – 2020-01-27 (×3): 4 mg via INTRAVENOUS
  Filled 2020-01-26 (×3): qty 2

## 2020-01-26 MED ORDER — ACETAMINOPHEN 325 MG PO TABS
650.0000 mg | ORAL_TABLET | Freq: Four times a day (QID) | ORAL | Status: DC | PRN
  Administered 2020-01-26: 650 mg via ORAL
  Filled 2020-01-26: qty 2

## 2020-01-26 MED ORDER — POTASSIUM CHLORIDE CRYS ER 20 MEQ PO TBCR
20.0000 meq | EXTENDED_RELEASE_TABLET | Freq: Every day | ORAL | Status: AC
  Administered 2020-01-26 – 2020-01-29 (×4): 20 meq via ORAL
  Filled 2020-01-26 (×4): qty 1

## 2020-01-26 MED ORDER — HYDRALAZINE HCL 10 MG PO TABS
10.0000 mg | ORAL_TABLET | Freq: Four times a day (QID) | ORAL | Status: DC | PRN
  Administered 2020-01-26: 10 mg via ORAL
  Filled 2020-01-26: qty 1

## 2020-01-26 MED ORDER — AMLODIPINE BESYLATE 5 MG PO TABS
5.0000 mg | ORAL_TABLET | Freq: Every day | ORAL | Status: DC
  Filled 2020-01-26: qty 1
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
## 2020-01-26 NOTE — ED Notes (Signed)
To ct

## 2020-01-26 NOTE — ED Triage Notes (Signed)
Patient presents to the ED with nausea and vomiting since 0100.  Patient reports drinking an unknown amount of vodka since yesterday.

## 2020-01-26 NOTE — ED Provider Notes (Signed)
[facility] Provider Note   CSN: [id number] Arrival date & time: 01/26/20  1123     History Chief Complaint  Patient presents with  . Emesis    Olivia Lester is a 44 y.o. female.  HPI      Olivia Lester is a 44 y.o. female with past medical history of chronic alcohol abuse, pancreatitis, hepatitis C, and polysubstance abuse, who presents to the Emergency Department complaining of persistent nausea vomiting and abdominal pain beginning around 1 AM today.  She states that she drinks alcohol daily and admits to heavy drinking yesterday.  She describes pain of her abdomen that is constant and sharp from her upper abdomen radiating into her back.  She states the pain feels like previous flares of her pancreatitis.  She reports multiple episodes of vomiting and now having dry heaves.  She states the vomiting has caused pain to her chest and making it difficult for her to breathe.  Denies bloody vomitus.  She states that she feels very anxious and tremulous and admits to history of alcohol related seizures.  No recent seizures.   Past Medical History:  Diagnosis Date  . Alcohol abuse   . Back injury   . Gastric ulcer   . Gastric ulcer 10/14/2013  . Hepatitis C 10/14/2013  . History of kidney stones   . 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 Pancreatitis, acute   . Panic attack   . Pneumonia   . Polysubstance abuse ([place])   . PONV (postoperative nausea and vomiting)   . Seizures [facility])     Patient Active Problem List   Diagnosis Date Noted  . Colitis 10/28/2019  . Polysubstance abuse ([place]) 10/28/2019  . Hypomagnesemia 10/28/2019  . Alcoholic hepatitis without ascites 10/28/2019  . Alcoholic gastritis 62/70/3500  . DTs (delirium tremens) ([place])   . Urinary tract infection without hematuria   . Alcohol intoxication ([place]) 09/04/2019  . Alcohol withdrawal ([place]) 06/24/2019  . Sinus tachycardia 06/24/2019  . Essential hypertension 06/24/2019  . Hypokalemia  03/31/2019  . Cannabis abuse 03/11/2019  . Elevated transaminase level   . Alcohol abuse   . Closed displaced fracture of fifth metatarsal bone of right foot 08/02/2018  . Dental abscess 12/25/2017  . Gastric ulcer 10/14/2013  . Transaminitis 10/14/2013  . Tobacco dependence 10/14/2013  . Hepatitis C 10/14/2013  . Thrombocytopenia ([place]) 10/13/2013  . Duodenitis 10/12/2013  . Alcoholism ([place]) 10/12/2013  . Abdominal pain, epigastric 10/12/2013  . ANKLE PAIN 01/01/2008    Past Surgical History:  Procedure Laterality Date  . BACK SURGERY    . BREAST SURGERY    . ESOPHAGOGASTRODUODENOSCOPY (EGD) WITH PROPOFOL N/A 10/14/2013   Procedure: ESOPHAGOGASTRODUODENOSCOPY (EGD) WITH PROPOFOL;  Surgeon: Rogene Houston, MD;  Location: [facility];  Service: Endoscopy;  Laterality: N/A;  . ORIF TOE FRACTURE Right 08/08/2018   Procedure: RIGHT FOOT INTERNAL FIXATION 5TH METATARSAL;  Surgeon: Newt Minion, MD;  Location: [facility];  Service: Orthopedics;  Laterality: Right;  . TONSILLECTOMY       OB History    Gravida  7   Para  3   Term  3   Preterm      AB  4   Living  3     SAB  2   TAB  2   Ectopic      Multiple      Live Births  3  Family History  Problem Relation Age of Onset  . Cancer Mother 22       ovarian   . Hypertension Mother   . Stroke Mother   . Early death Father   . Alcohol abuse Father     Social History   Tobacco Use  . Smoking status: Current Every Day Smoker    Packs/day: 1.00    Years: 20.00    Pack years: 20.00    Types: Cigarettes  . Smokeless tobacco: Never Used  Vaping Use  . Vaping Use: Never used  Substance Use Topics  . Alcohol use: Yes    Alcohol/week: 18.0 standard drinks    Types: 18 Cans of beer per week    Comment: 1/5 -1/2 gallon of liqour daily or 18 beers daily  . Drug use: Yes    Types: Marijuana, Cocaine, Methamphetamines    Comment: denies current cocaine/meth...current mj    Home Medications Prior to  Admission medications   Medication Sig Start Date End Date Taking? Authorizing Provider  amLODipine (NORVASC) 5 MG tablet Take 1 tablet (5 mg total) by mouth daily. 10/28/19   Roxan Hockey, MD  diphenhydrAMINE (BENADRYL) 25 mg capsule Take 25 mg by mouth daily as needed for allergies.    [provider]  folic acid (FOLVITE) 1 MG tablet Take 1 tablet (1 mg total) by mouth daily. Patient not taking: Reported on 01/07/2020 10/29/19   Roxan Hockey, MD  lipase/protease/amylase 24000-76000 units CPEP Take 1 capsule (24,000 Units total) by mouth 3 (three) times daily before meals. Patient not taking: Reported on 01/07/2020 10/29/19   Roxan Hockey, MD  Multiple Vitamin (MULTIVITAMIN WITH MINERALS) TABS tablet Take 1 tablet by mouth every other day.  Patient not taking: Reported on 01/07/2020    [provider]  naproxen sodium (ALEVE) 220 MG tablet Take 440 mg by mouth daily as needed.    [provider]  pantoprazole (PROTONIX) 40 MG tablet Take 1 tablet (40 mg total) by mouth daily. 10/29/19   Roxan Hockey, MD  thiamine 100 MG tablet Take 1 tablet (100 mg total) by mouth daily. Patient not taking: Reported on 01/07/2020 10/29/19   Roxan Hockey, MD    Allergies    Toradol [ketorolac tromethamine] and Asa [aspirin]  Review of Systems   Review of Systems  Constitutional: Positive for appetite change. Negative for chills, fatigue and fever.  HENT: Negative for sore throat.   Respiratory: Positive for shortness of breath. Negative for cough.   Cardiovascular: Positive for chest pain. Negative for palpitations.  Gastrointestinal: Positive for abdominal pain, nausea and vomiting. Negative for abdominal distention, blood in stool and diarrhea.  Genitourinary: Negative for difficulty urinating, dysuria and flank pain.  Musculoskeletal: Positive for back pain. Negative for arthralgias, myalgias, neck pain and neck stiffness.  Skin: Negative for rash.  Neurological:  Negative for dizziness, weakness, numbness and headaches.  Hematological: Does not bruise/bleed easily.    Physical Exam Updated Vital Signs BP (!) 174/133 (BP Location: Right Arm) Comment: patient vomiting, tearful  Pulse (!) 140   Temp 97.8 F (36.6 C)   Resp (!) 27   Ht 5\' 7"  (1.702 m)   Wt 53 kg   SpO2 100%   BMI 18.30 kg/m   Physical Exam Vitals and nursing note reviewed.  Constitutional:      Appearance: Normal appearance. She is not ill-appearing or toxic-appearing.  HENT:     Head: Atraumatic.     Mouth/Throat:     Mouth:  Mucous membranes are dry.  Eyes:     General: No scleral icterus.    Extraocular Movements: Extraocular movements intact.     Conjunctiva/sclera: Conjunctivae normal.     Pupils: Pupils are equal, round, and reactive to light.  Cardiovascular:     Rate and Rhythm: Normal rate and regular rhythm.     Pulses: Normal pulses.  Pulmonary:     Effort: Pulmonary effort is normal.     Breath sounds: Normal breath sounds.  Abdominal:     Palpations: Abdomen is soft.     Tenderness: There is abdominal tenderness. There is no right CVA tenderness or left CVA tenderness.  Musculoskeletal:     Cervical back: Normal range of motion.  Neurological:     Mental Status: She is alert.     ED Results / Procedures / Treatments   Labs (all labs ordered are listed, but only abnormal results are displayed) Labs Reviewed  CBC WITH DIFFERENTIAL/PLATELET - Abnormal; Notable for the following components:      Result Value   WBC 20.0 (*)    MCV 105.8 (*)    MCH 34.9 (*)    Neutro Abs 17.7 (*)    All other components within normal limits  COMPREHENSIVE METABOLIC PANEL - Abnormal; Notable for the following components:   Potassium 3.4 (*)    CO2 18 (*)    Glucose, Bld 100 (*)    Total Protein 8.2 (*)    AST 107 (*)    ALT 59 (*)    Anion gap 27 (*)    All other components within normal limits  ETHANOL - Abnormal; Notable for the following components:    Alcohol, Ethyl (B) 27 (*)    All other components within normal limits  LIPASE, BLOOD - Abnormal; Notable for the following components:   Lipase 1,379 (*)    All other components within normal limits  MAGNESIUM - Abnormal; Notable for the following components:   Magnesium 1.2 (*)    All other components within normal limits  URINALYSIS, ROUTINE W REFLEX MICROSCOPIC - Abnormal; Notable for the following components:   Ketones, ur 20 (*)    All other components within normal limits  SARS CORONAVIRUS 2 BY RT PCR (HOSPITAL ORDER, [facility])  PREGNANCY, URINE  TROPONIN I (HIGH SENSITIVITY)  TROPONIN I (HIGH SENSITIVITY)    EKG None  Radiology CT ABDOMEN PELVIS W CONTRAST  Result Date: 01/26/2020 CLINICAL DATA:  Abdominal abscess/infection suspected. Nausea, vomiting. Patient drank an unknown amount of lack of since yesterday. History of kidney stones. EXAM: CT ABDOMEN AND PELVIS WITH CONTRAST TECHNIQUE: Multidetector CT imaging of the abdomen and pelvis was performed using the standard protocol following bolus administration of intravenous contrast. CONTRAST:  123mL OMNIPAQUE IOHEXOL 300 MG/ML  SOLN COMPARISON:  10/28/2019 FINDINGS: Lower chest: Lung bases are unremarkable. Hepatobiliary: There is severe hepatic steatosis. No focal liver lesion identified. Gallbladder is present. Pancreas: There is fluid surrounding the pancreatic head, body, and tail. There is stranding within the retroperitoneum. Pancreatic parenchyma enhances normally. No discrete pseudocysts. Spleen: Normal in size without focal abnormality. Adrenals/Urinary Tract: Normal adrenal glands. Kidneys and ureters are unremarkable. The bladder and visualized portion of the urethra are normal. Stomach/Bowel: Stomach is unremarkable. There is fluid surrounding the duodenal, adjacent to the pancreatic head and likely secondary to pancreatitis rather than duodenal disease. The remainder of the small bowel loops  are unremarkable. Normal appendix. Vascular/Lymphatic: There is atherosclerotic calcification of the abdominal aorta.  No aneurysm. There is normal vascular opacification of the celiac axis, superior mesenteric artery, and inferior mesenteric artery. Normal appearance of the portal venous system and inferior vena cava. No retroperitoneal or mesenteric adenopathy. Reproductive: Uterus is present. RIGHT ovarian dermoid is 2.0 centimeters, stable in appearance. LEFT adnexal region is unremarkable. Other: No free pelvic fluid. Anterior abdominal wall is unremarkable. Musculoskeletal: Remote posterior fusion of L5-S1. IMPRESSION: 1. Findings are consistent with acute pancreatitis. No pancreatic necrosis or discrete pseudocysts. 2. Severe hepatic steatosis. 3. Stable appearance of RIGHT ovarian dermoid. 4. Remote posterior fusion of L5-S1. 5. Aortic Atherosclerosis (ICD10-I70.0). Electronically Signed   By: Nolon Nations M.D.   On: 01/26/2020 15:46    Procedures Procedures (including critical care time)  Medications Ordered in ED Medications  sodium chloride 0.9 % bolus 1,000 mL (has no administration in time range)  thiamine tablet 100 mg (has no administration in time range)  LORazepam (ATIVAN) injection 1 mg (has no administration in time range)  fentaNYL (SUBLIMAZE) injection 50 mcg (has no administration in time range)  sodium chloride 0.9 % bolus 1,000 mL (1,000 mLs Intravenous New Bag/Given 01/26/20 1159)  ondansetron (ZOFRAN) injection 4 mg (4 mg Intravenous Given 01/26/20 1159)    ED Course  I have reviewed the triage vital signs and the nursing notes.  Pertinent labs & imaging results that were available during my care of the patient were reviewed by me and considered in my medical decision making (see chart for details).    MDM Rules/Calculators/A&P                         Patient with history of pancreatitis and alcohol abuse, admits to heavy alcohol last evening with last drink around 3  PM yesterday.  History of seizures, but denies recent seizure activity.  Actively vomiting on arrival.  She appears tremulous and pain to her upper abdomen that is radiating to her back. States that pain is similar to previous flares of her pancreatitis.  Patient nontoxic-appearing.  Appears dehydrated. Will obtain labs, IV fluids and likely CT abdomen and pelvis.  Patient has leukocytosis that may be related to persistent vomiting, no history of fever or recent illness.  No dysuria symptoms.  Lipase is elevated at greater than 1300.  Hypomagnesemia and elevated blood alcohol level of 27.  Symptoms likely related to acute on chronic pancreatitis related to alcohol abuse.  Patient has received IV normal saline, magnesium, Ativan, antiemetic and pain medication.    On recheck, patient resting no active vomiting but continues to have pain to her upper abdomen.  Will order CT of the abdomen pelvis.  Will consult hospitalist for admission.  Consulted hospitalist, Dr. Linda Hedges, who agrees to admit.     Final Clinical Impression(s) / ED Diagnoses Final diagnoses:  Alcohol-induced acute pancreatitis, unspecified complication status    Rx / DC Orders ED Discharge Orders    None       Kem Parkinson, PA-C 01/26/20 1551    Milton Ferguson, MD 01/28/20 1343

## 2020-01-26 NOTE — ED Notes (Signed)
Hx polysubstance addiction Pancreatitis  Drinks a pint of liquor daily  Presents with N/V since 0500 after imbibing  Complaint of abd pain with request of pain meds   IV est, labs drawn by this RN- to lab   IV where pt pointed as she has used IV in the past

## 2020-01-26 NOTE — ED Notes (Signed)
Reality therapy with pt   Having left AMA in past, she is asked if she will commit to staying should she be admitted   Is educated that we are only one part of her care And need her part in her care for it to be successful including staying until she is discharged  She is asked if she is interested in Detox inpt and psych eval  She replies "probably"

## 2020-01-26 NOTE — ED Notes (Signed)
Report to Ellise, RN

## 2020-01-27 LAB — COMPREHENSIVE METABOLIC PANEL
ALT: 41 U/L (ref 0–44)
AST: 64 U/L — ABNORMAL HIGH (ref 15–41)
Albumin: 3.6 g/dL (ref 3.5–5.0)
Alkaline Phosphatase: 84 U/L (ref 38–126)
Anion gap: 14 (ref 5–15)
BUN: <5 mg/dL — ABNORMAL LOW (ref 6–20)
CO2: 25 mmol/L (ref 22–32)
Calcium: 8 mg/dL — ABNORMAL LOW (ref 8.9–10.3)
Chloride: 94 mmol/L — ABNORMAL LOW (ref 98–111)
Creatinine, Ser: 0.33 mg/dL — ABNORMAL LOW (ref 0.44–1.00)
GFR calc Af Amer: >60 mL/min (ref >60)
GFR calc non Af Amer: >60 mL/min (ref >60)
Glucose, Bld: 118 mg/dL — ABNORMAL HIGH (ref 70–99)
Potassium: 3.2 mmol/L — ABNORMAL LOW (ref 3.5–5.1)
Sodium: 133 mmol/L — ABNORMAL LOW (ref 135–145)
Total Bilirubin: 1 mg/dL (ref 0.3–1.2)
Total Protein: 6.6 g/dL (ref 6.5–8.1)

## 2020-01-27 LAB — CBC
HCT: 39.4 % (ref 36.0–46.0)
Hemoglobin: 13.5 g/dL (ref 12.0–15.0)
MCH: 35.4 pg — ABNORMAL HIGH (ref 26.0–34.0)
MCHC: 34.3 g/dL (ref 30.0–36.0)
MCV: 103.4 fL — ABNORMAL HIGH (ref 80.0–100.0)
Platelets: 110 10*3/uL — ABNORMAL LOW (ref 150–400)
RBC: 3.81 MIL/uL — ABNORMAL LOW (ref 3.87–5.11)
RDW: 13.2 % (ref 11.5–15.5)
WBC: 10.6 10*3/uL — ABNORMAL HIGH (ref 4.0–10.5)
nRBC: 0 % (ref 0.0–0.2)

## 2020-01-27 LAB — LIPASE, BLOOD: Lipase: 698 U/L — ABNORMAL HIGH (ref 11–51)

## 2020-01-27 MED ORDER — AMLODIPINE BESYLATE 5 MG PO TABS
10.0000 mg | ORAL_TABLET | Freq: Every day | ORAL | Status: DC
  Administered 2020-01-27 – 2020-01-29 (×3): 10 mg via ORAL
  Filled 2020-01-27 (×3): qty 2

## 2020-01-27 MED ORDER — POTASSIUM CHLORIDE CRYS ER 20 MEQ PO TBCR
40.0000 meq | EXTENDED_RELEASE_TABLET | ORAL | Status: AC
  Administered 2020-01-27 (×2): 40 meq via ORAL
  Filled 2020-01-27 (×2): qty 2

## 2020-01-27 MED ORDER — HYDRALAZINE HCL 10 MG PO TABS: 10.0000 mg | ORAL_TABLET | ORAL | Status: DC | PRN

## 2020-01-27 MED ORDER — HYDRALAZINE HCL 10 MG PO TABS
10.0000 mg | ORAL_TABLET | ORAL | Status: DC | PRN
  Administered 2020-01-27: 10 mg via ORAL
  Filled 2020-01-27: qty 1

## 2020-01-27 NOTE — Progress Notes (Signed)
Scherrie November MD notified of patient's elevated BP even after the PRN hydralazine. Continue to monitor. Awaiting response and further orders.

## 2020-01-27 NOTE — Progress Notes (Signed)
Patient Demographics:    Olivia Lester, is a 44 y.o. female, DOB - March 11, 1976, [id number]  Admit date - 01/26/2020   Admitting Physician Neena Rhymes, MD  Outpatient Primary MD for the patient is Patient, No Pcp Per  LOS - 1   Chief Complaint  Patient presents with  . Emesis        Subjective:    Olivia Lester today has no fevers, No chest pain,   -Complains of nausea, abdominal pain --RN Brandi at bedside -Patient states she is not interested in alcohol rehab programs  Assessment  & Plan :    Active Problems:   Alcohol withdrawal (HCC)   Essential hypertension   Alcoholism (HCC)   Pancreatitis, recurrent   Brief Summary: 44 year old femalewith past medical history relevant for EtOH abuse, tobacco abuse, and THC, and benzo abuse as well asHTN,history of hep C,anxiety disorder, status post prior ORIF of right ankle, history of alcoholic esophageal gastro duodenitis, severe hepatic steatosis and chronic alcohol induced thrombocytopenia, recurrent episodes of alcoholic pancreatitis -Readmitted on 01/26/2020 with flareup of pancreatitis and electrolyte abnormalities in the setting of ongoing alcohol abuse Patient reports typically drinking an 18 pack or half a gallon of liquor daily--- -- its been a long time since patient went a couple of days without drinking-- --patient is not interested in alcohol rehab or drug rehab  A/p 1)Hypokalemia and Severe Hypomagnesiemia---suspect due to GI losses in an alcoholic female with some degree of possible pancreatic insufficiency and chronic diarrhea Mag is 1.2, -Replaced IV and orally as tolerated  2)Acute on chronic pancreatitis with abdominal pain andNausea--- -Lipase is down to 1,379 >>> 698 --Clear liquids, advance slowly, as needed opiates and as needed antiemetics  3) intractable emesis-- -Secondary to alcoholic induced gastritis and  pancreatitis -pt also likely has component of cannabis hyperemesis syndrome -Bowel rest, as needed antiemetics -Advised to use Creon  4)PolysubstanceAbuse ---Pt uses Tobacco,Benzo, Alcohol, opioids, marijuana --Patientrepeatedlydeclines rehab   5)Tobacco abuse -Since is not interested in smoking cessation,useNicotinepatch while in the hospital  6)Alcohol Abusewithalcohol withdrawal symptoms---?? seizure ----Patient states she is not really ready to quit drinking now -She lives with her significant other who drinks heavily too -Lorazepam per [facility] protocol, thiamine and folic acid as ordered  7)ChronicThrombocytopenia --from EtOH abuse,   8)Hypertension -notcompliant withPTAAmlodipine -Compliance with amlodipine advised Okay to use as needed hydralazine   Disposition/Need for in-Hospital Stay- patient unable to be discharged at this time due to --- severe electrolyte abnormalities, needs iv pain medicine IV antiemetics and IV fluids for acute Pancreatitis with persistent emesis and alcohol withdrawal  Status is: Inpatient  Remains inpatient appropriate because:severe electrolyte abnormalities, needs iv pain medicine IV antiemetics and IV fluids for acute Pancreatitis with persistent emesis and alcohol withdrawal   Disposition: The patient is from: Home              Anticipated d/c is to: Home              Anticipated d/c date is: 2 days              Patient currently is not medically stable to d/c. Barriers: Not Clinically Stable- severe electrolyte abnormalities, needs iv pain medicine IV antiemetics and IV fluids for acute  Pancreatitis with persistent emesis and alcohol withdrawal  Code Status : full  Family Communication:   (patient is alert, awake and coherent)   Consults  :  na  DVT Prophylaxis  :  Lovenox - SCDs   Lab Results  Component Value Date   PLT 110 (L) 01/27/2020    Inpatient Medications  Scheduled Meds: . amLODipine  10 mg Oral  Daily  . Chlorhexidine Gluconate Cloth  6 each Topical Daily  . enoxaparin (LOVENOX) injection  40 mg Subcutaneous Q24H  . folic acid  1 mg Oral Daily  . lipase/protease/amylase  12,000 Units Oral TID WC  . LORazepam  0-4 mg Intravenous Q6H   Followed by  . [START ON 01/28/2020] LORazepam  0-4 mg Intravenous Q12H  . magnesium oxide  400 mg Oral Daily  . multivitamin with minerals  1 tablet Oral Daily  . nicotine  21 mg Transdermal Daily  . pantoprazole  40 mg Oral Daily  . potassium chloride  20 mEq Oral Daily  . potassium chloride  40 mEq Oral Q3H  . thiamine  100 mg Oral Daily   Continuous Infusions: . dextrose 5 % and 0.45% NaCl 50 mL/hr at 01/27/20 0509   PRN Meds:.acetaminophen **OR** acetaminophen, hydrALAZINE, LORazepam **OR** LORazepam, morphine injection, ondansetron (ZOFRAN) IV    Anti-infectives (From admission, onward)   None        Objective:   Vitals:   01/27/20 0600 01/27/20 0700 01/27/20 0755 01/27/20 0800  BP: (!) 139/99 (!) 146/104  134/89  Pulse: 92 92  94  Resp: (!) 24 (!) 21  17  Temp:   98.5 F (36.9 C)   TempSrc:   Oral   SpO2: 97% 97%  96%  Weight:      Height:        Wt Readings from Last 3 Encounters:  01/27/20 55.8 kg  01/07/20 53.5 kg  12/01/19 54.4 kg     Intake/Output Summary (Last 24 hours) at 01/27/2020 0930 Last data filed at 01/27/2020 0509 Gross per 24 hour  Intake 2846.16 ml  Output 1200 ml  Net 1646.16 ml    Physical Exam  Gen:- Awake Alert,   HEENT:- [place].AT, No sclera icterus Neck-Supple Neck,No JVD,.  Lungs-  CTAB , fair symmetrical air movement CV- S1, S2 normal, regular  Abd-  +ve B.Sounds, Abd Soft, left upper quadrant, epigastric and periumbilical area tenderness without rebound or guarding  extremity/Skin:- No  edema, pedal pulses present  Psych-affect is appropriate, oriented x3 Neuro-no new focal deficits, +ve tremors   Data Review:   Micro Results Recent Results (from the past 240 hour(s))  SARS  Coronavirus 2 by RT PCR (hospital order, performed in [facility] hospital lab) Nasopharyngeal Nasopharyngeal Swab     Status: None   Collection Time: 01/26/20  2:03 PM   Specimen: Nasopharyngeal Swab  Result Value Ref Range Status   SARS Coronavirus 2 NEGATIVE NEGATIVE Final    Comment: (NOTE) SARS-CoV-2 target nucleic acids are NOT DETECTED.  The SARS-CoV-2 RNA is generally detectable in upper and lower respiratory specimens during the acute phase of infection. The lowest concentration of SARS-CoV-2 viral copies this assay can detect is 250 copies / mL. A negative result does not preclude SARS-CoV-2 infection and should not be used as the sole basis for treatment or other patient management decisions.  A negative result may occur with improper specimen collection / handling, submission of specimen other than nasopharyngeal swab, presence of viral mutation(s) within the areas targeted  by this assay, and inadequate number of viral copies (<250 copies / mL). A negative result must be combined with clinical observations, patient history, and epidemiological information.  Fact Sheet for Patients:   StrictlyIdeas.no  Fact Sheet for Healthcare Providers: BankingDealers.co.za  This test is not yet approved or  cleared by the [place] FDA and has been authorized for detection and/or diagnosis of SARS-CoV-2 by FDA under an Emergency Use Authorization (EUA).  This EUA will remain in effect (meaning this test can be used) for the duration of the COVID-19 declaration under Section 564(b)(1) of the Act, 21 U.S.C. section 360bbb-3(b)(1), unless the authorization is terminated or revoked sooner.  Performed at [facility], [address], [place], [place] [address]   MRSA PCR Screening     Status: None   Collection Time: 01/26/20  4:32 PM   Specimen: Nasopharyngeal  Result Value Ref Range Status   MRSA by PCR NEGATIVE NEGATIVE Final     Comment:        The GeneXpert MRSA Assay (FDA approved for NASAL specimens only), is one component of a comprehensive MRSA colonization surveillance program. It is not intended to diagnose MRSA infection nor to guide or monitor treatment for MRSA infections. Performed at [facility], [address], [place], [place] [address]     Radiology Reports CT ABDOMEN PELVIS W CONTRAST  Result Date: 01/26/2020 CLINICAL DATA:  Abdominal abscess/infection suspected. Nausea, vomiting. Patient drank an unknown amount of lack of since yesterday. History of kidney stones. EXAM: CT ABDOMEN AND PELVIS WITH CONTRAST TECHNIQUE: Multidetector CT imaging of the abdomen and pelvis was performed using the standard protocol following bolus administration of intravenous contrast. CONTRAST:  152mL OMNIPAQUE IOHEXOL 300 MG/ML  SOLN COMPARISON:  10/28/2019 FINDINGS: Lower chest: Lung bases are unremarkable. Hepatobiliary: There is severe hepatic steatosis. No focal liver lesion identified. Gallbladder is present. Pancreas: There is fluid surrounding the pancreatic head, body, and tail. There is stranding within the retroperitoneum. Pancreatic parenchyma enhances normally. No discrete pseudocysts. Spleen: Normal in size without focal abnormality. Adrenals/Urinary Tract: Normal adrenal glands. Kidneys and ureters are unremarkable. The bladder and visualized portion of the urethra are normal. Stomach/Bowel: Stomach is unremarkable. There is fluid surrounding the duodenal, adjacent to the pancreatic head and likely secondary to pancreatitis rather than duodenal disease. The remainder of the small bowel loops are unremarkable. Normal appendix. Vascular/Lymphatic: There is atherosclerotic calcification of the abdominal aorta. No aneurysm. There is normal vascular opacification of the celiac axis, superior mesenteric artery, and inferior mesenteric artery. Normal appearance of the portal venous system and inferior vena cava. No  retroperitoneal or mesenteric adenopathy. Reproductive: Uterus is present. RIGHT ovarian dermoid is 2.0 centimeters, stable in appearance. LEFT adnexal region is unremarkable. Other: No free pelvic fluid. Anterior abdominal wall is unremarkable. Musculoskeletal: Remote posterior fusion of L5-S1. IMPRESSION: 1. Findings are consistent with acute pancreatitis. No pancreatic necrosis or discrete pseudocysts. 2. Severe hepatic steatosis. 3. Stable appearance of RIGHT ovarian dermoid. 4. Remote posterior fusion of L5-S1. 5. Aortic Atherosclerosis (ICD10-I70.0). Electronically Signed   By: Nolon Nations M.D.   On: 01/26/2020 15:46   US Abdomen Limited RUQ  Result Date: 01/07/2020 CLINICAL DATA:  Mid to upper abdominal pain with vomiting for 1 day. EXAM: ULTRASOUND ABDOMEN LIMITED RIGHT UPPER QUADRANT COMPARISON:  10/28/2019 CT abdomen/pelvis. FINDINGS: Gallbladder: Layering sludge in the nondistended gallbladder. No shadowing gallstones. No gallbladder wall thickening. No pericholecystic fluid. No sonographic Murphy sign. Common bile duct: Diameter: 6 mm Liver: Liver parenchyma is  diffusely echogenic with posterior acoustic attenuation, in keeping with diffuse hepatic steatosis. No definite liver surface irregularity. No liver masses, noting decreased sensitivity in the setting of an echogenic liver. Portal vein is patent on color Doppler imaging with normal direction of blood flow towards the liver. Other: None. IMPRESSION: 1. Gallbladder sludge.  No cholelithiasis.  No acute cholecystitis. 2. Top normal caliber common bile duct, 6 mm diameter. 3. Diffuse hepatic steatosis. Electronically Signed   By: Ilona Sorrel M.D.   On: 01/07/2020 10:08     CBC Recent Labs  Lab 01/26/20 1158 01/27/20 0422  WBC 20.0* 10.6*  HGB 15.0 13.5  HCT 45.5 39.4  PLT 202 110*  MCV 105.8* 103.4*  MCH 34.9* 35.4*  MCHC 33.0 34.3  RDW 13.7 13.2  LYMPHSABS 1.4  --   MONOABS 0.7  --   EOSABS 0.0  --   BASOSABS 0.1  --      Chemistries  Recent Labs  Lab 01/26/20 1158 01/26/20 1216 01/27/20 0422  NA 143  --  133*  K 3.4*  --  3.2*  CL 98  --  94*  CO2 18*  --  25  GLUCOSE 100*  --  118*  BUN 6  --  <5*  CREATININE 0.53  --  0.33*  CALCIUM 9.3  --  8.0*  MG  --  1.2*  --   AST 107*  --  64*  ALT 59*  --  41  ALKPHOS 103  --  84  BILITOT 0.7  --  1.0   ------------------------------------------------------------------------------------------------------------------ No results for input(s): CHOL, HDL, LDLCALC, TRIG, CHOLHDL, LDLDIRECT in the last 72 hours.  No results found for: HGBA1C ------------------------------------------------------------------------------------------------------------------ No results for input(s): TSH, T4TOTAL, T3FREE, THYROIDAB in the last 72 hours.  Invalid input(s): FREET3 ------------------------------------------------------------------------------------------------------------------ No results for input(s): VITAMINB12, FOLATE, FERRITIN, TIBC, IRON, RETICCTPCT in the last 72 hours.  Coagulation profile No results for input(s): INR, PROTIME in the last 168 hours.  No results for input(s): DDIMER in the last 72 hours.  Cardiac Enzymes No results for input(s): CKMB, TROPONINI, MYOGLOBIN in the last 168 hours.  Invalid input(s): CK ------------------------------------------------------------------------------------------------------------------ No results found for: BNP   Roxan Hockey M.D on 01/27/2020 at 9:30 AM  Go to [email or web address] - for contact info  [facility] - Office  [phone]

## 2020-01-28 LAB — COMPREHENSIVE METABOLIC PANEL
ALT: 33 U/L (ref 0–44)
AST: 41 U/L (ref 15–41)
Albumin: 3.4 g/dL — ABNORMAL LOW (ref 3.5–5.0)
Alkaline Phosphatase: 83 U/L (ref 38–126)
Anion gap: 11 (ref 5–15)
BUN: 5 mg/dL — ABNORMAL LOW (ref 6–20)
CO2: 23 mmol/L (ref 22–32)
Calcium: 8.8 mg/dL — ABNORMAL LOW (ref 8.9–10.3)
Chloride: 100 mmol/L (ref 98–111)
Creatinine, Ser: 0.34 mg/dL — ABNORMAL LOW (ref 0.44–1.00)
GFR calc Af Amer: >60 mL/min (ref >60)
GFR calc non Af Amer: >60 mL/min (ref >60)
Glucose, Bld: 84 mg/dL (ref 70–99)
Potassium: 3.9 mmol/L (ref 3.5–5.1)
Sodium: 134 mmol/L — ABNORMAL LOW (ref 135–145)
Total Bilirubin: 1.1 mg/dL (ref 0.3–1.2)
Total Protein: 6.4 g/dL — ABNORMAL LOW (ref 6.5–8.1)

## 2020-01-28 LAB — CBC
HCT: 40.5 % (ref 36.0–46.0)
Hemoglobin: 13.2 g/dL (ref 12.0–15.0)
MCH: 34.4 pg — ABNORMAL HIGH (ref 26.0–34.0)
MCHC: 32.6 g/dL (ref 30.0–36.0)
MCV: 105.5 fL — ABNORMAL HIGH (ref 80.0–100.0)
Platelets: 90 10*3/uL — ABNORMAL LOW (ref 150–400)
RBC: 3.84 MIL/uL — ABNORMAL LOW (ref 3.87–5.11)
RDW: 13 % (ref 11.5–15.5)
WBC: 6.3 10*3/uL (ref 4.0–10.5)
nRBC: 0 % (ref 0.0–0.2)

## 2020-01-28 LAB — MAGNESIUM: Magnesium: 1.7 mg/dL (ref 1.7–2.4)

## 2020-01-28 MED ORDER — MORPHINE SULFATE (PF) 2 MG/ML IV SOLN
2.0000 mg | INTRAVENOUS | Status: DC | PRN
  Administered 2020-01-28 – 2020-01-29 (×5): 2 mg via INTRAVENOUS
  Filled 2020-01-28 (×5): qty 1

## 2020-01-28 MED ORDER — MAGNESIUM SULFATE 2 GM/50ML IV SOLN
2.0000 g | Freq: Once | INTRAVENOUS | Status: AC
  Administered 2020-01-28: 2 g via INTRAVENOUS
  Filled 2020-01-28: qty 50

## 2020-01-28 MED ORDER — PANCRELIPASE (LIP-PROT-AMYL) 12000-38000 UNITS PO CPEP
12000.0000 [IU] | ORAL_CAPSULE | Freq: Three times a day (TID) | ORAL | Status: DC
  Administered 2020-01-28 – 2020-01-29 (×3): 12000 [IU] via ORAL
  Filled 2020-01-28 (×3): qty 1

## 2020-01-28 NOTE — Progress Notes (Deleted)
Resting comfortably. Did complain of left shoulder and arm pain two times but was able to stop on its own. Did notify Dr. Manuella Ghazi. Dr. Manuella Ghazi ask that we not drop blood pressure to quickly and to keep above 160 but below 672 systolic.

## 2020-01-28 NOTE — Progress Notes (Signed)
Patient Demographics:    Olivia Lester, is a 44 y.o. female, DOB - [birth year], ATF:[id number]  Admit date - 01/26/2020   Admitting Physician Neena Rhymes, MD  Outpatient Primary MD for the patient is Patient, No Pcp Per  LOS - 2   Chief Complaint  Patient presents with  . Emesis        Subjective:    Olivia Lester today has no fevers, No chest pain,    --RN Lawrence at bedside -tolerating clear liquids  -Complains of anxiety and tremors -Has nausea and abdominal pain but no emesis -We will try to advance diet and see if patient tolerates oral intake   Assessment  & Plan :    Active Problems:   Hypomagnesemia   Alcohol withdrawal (HCC)   Essential hypertension   Alcoholism (HCC)   Pancreatitis, recurrent   Brief Summary: 44 year old femalewith past medical history relevant for EtOH abuse, tobacco abuse, and THC, and benzo abuse as well asHTN,history of hep C,anxiety disorder, status post prior ORIF of right ankle, history of alcoholic esophageal gastro duodenitis, severe hepatic steatosis and chronic alcohol induced thrombocytopenia, recurrent episodes of alcoholic pancreatitis -Readmitted on 01/26/2020 with flareup of pancreatitis and electrolyte abnormalities in the setting of ongoing alcohol abuse Patient reports typically drinking an 18 pack or half a gallon of liquor daily--- -- its been a long time since patient went a couple of days without drinking-- --patient is not interested in alcohol rehab or drug rehab  A/p 1)Hypokalemia and Severe Hypomagnesiemia---suspect due to GI losses in an alcoholic female with some degree of possible pancreatic insufficiency and chronic diarrhea Mag is up to 1.7 from 1.2, give additional IV mag -Replaced IV and orally as tolerated  2)Acute on chronic pancreatitis with abdominal pain andNausea--- -Lipase is down to 1,379 >>> 698 --c/n as needed  opiates and as needed antiemetics Advance to full liquid  3)Intractable Emesis-- -Secondary to alcoholic induced gastritis and pancreatitis -pt also likely has component of cannabis hyperemesis syndrome -Bowel rest, as needed antiemetics -Advised to use Creon -Continue as needed Zofran  4)PolysubstanceAbuse ---Pt uses Tobacco,Benzo, Alcohol, opioids, marijuana --Patientrepeatedlydeclines rehab   5)Tobacco abuse -Since is not interested in smoking cessation,useNicotinepatch while in the hospital  6)Alcohol Abusewithalcohol withdrawal symptoms---?? seizure ----Patient states she is not really ready to quit drinking now -She lives with her significant other who drinks heavily too -Lorazepam per CIWA protocol, thiamine and folic acid as ordered  7)ChronicThrombocytopenia --from EtOH abuse,   8)Hypertension -notcompliant withPTAAmlodipine -Compliance with amlodipine advised Okay to use as needed iv hydralazine   Disposition/Need for in-Hospital Stay- patient unable to be discharged at this time due to --- severe electrolyte abnormalities, needs iv pain medicine IV antiemetics and IV fluids for acute Pancreatitis with persistent emesis and alcohol withdrawal  Status is: Inpatient  Remains inpatient appropriate because:severe electrolyte abnormalities, needs iv pain medicine IV antiemetics and IV fluids for acute Pancreatitis with persistent emesis and alcohol withdrawal   Disposition: The patient is from: Home              Anticipated d/c is to: Home              Anticipated d/c date is: 2 days  Patient currently is not medically stable to d/c. Barriers: Not Clinically Stable- severe electrolyte abnormalities, needs iv pain medicine IV antiemetics and IV fluids for acute Pancreatitis with persistent emesis and alcohol withdrawal -We will try to advance diet and see if patient tolerates oral intake  Code Status : full  Family Communication:    (patient is alert, awake and coherent)   Consults  :  na  DVT Prophylaxis  :  Lovenox - SCDs   Lab Results  Component Value Date   PLT 90 (L) 01/28/2020   Inpatient Medications  Scheduled Meds: . amLODipine  10 mg Oral Daily  . Chlorhexidine Gluconate Cloth  6 each Topical Daily  . enoxaparin (LOVENOX) injection  40 mg Subcutaneous Q24H  . folic acid  1 mg Oral Daily  . LORazepam  0-4 mg Intravenous Q6H   Followed by  . LORazepam  0-4 mg Intravenous Q12H  . magnesium oxide  400 mg Oral Daily  . multivitamin with minerals  1 tablet Oral Daily  . nicotine  21 mg Transdermal Daily  . pantoprazole  40 mg Oral Daily  . potassium chloride  20 mEq Oral Daily  . thiamine  100 mg Oral Daily   Continuous Infusions: . magnesium sulfate bolus IVPB 2 g (01/28/20 0925)   PRN Meds:.acetaminophen **OR** acetaminophen, hydrALAZINE, LORazepam **OR** LORazepam, morphine injection, ondansetron (ZOFRAN) IV    Anti-infectives (From admission, onward)   None        Objective:   Vitals:   01/28/20 0600 01/28/20 0700 01/28/20 0737 01/28/20 0744  BP: 117/85 138/88    Pulse: 74 65 71   Resp: 14     Temp:    97.9 F (36.6 C)  TempSrc:    Oral  SpO2: 98% 98% 99%   Weight:      Height:        Wt Readings from Last 3 Encounters:  01/28/20 55.5 kg  01/07/20 53.5 kg  12/01/19 54.4 kg     Intake/Output Summary (Last 24 hours) at 01/28/2020 0935 Last data filed at 01/28/2020 0400 Gross per 24 hour  Intake 360 ml  Output 1250 ml  Net -890 ml   Physical Exam  Gen:- Awake Alert,   HEENT:- [place].AT, No sclera icterus Neck-Supple Neck,No JVD,.  Lungs-  CTAB , fair symmetrical air movement CV- S1, S2 normal, regular  Abd-  +ve B.Sounds, Abd Soft, left upper quadrant, epigastric and periumbilical area tenderness without rebound or guarding  extremity/Skin:- No  edema, pedal pulses present  Psych-affect is appropriate, oriented x3 Neuro-no new focal deficits, +ve tremors   Data Review:     Micro Results Recent Results (from the past 240 hour(s))  SARS Coronavirus 2 by RT PCR (hospital order, performed in [facility] hospital lab) Nasopharyngeal Nasopharyngeal Swab     Status: None   Collection Time: 01/26/20  2:03 PM   Specimen: Nasopharyngeal Swab  Result Value Ref Range Status   SARS Coronavirus 2 NEGATIVE NEGATIVE Final    Comment: (NOTE) SARS-CoV-2 target nucleic acids are NOT DETECTED.  The SARS-CoV-2 RNA is generally detectable in upper and lower respiratory specimens during the acute phase of infection. The lowest concentration of SARS-CoV-2 viral copies this assay can detect is 250 copies / mL. A negative result does not preclude SARS-CoV-2 infection and should not be used as the sole basis for treatment or other patient management decisions.  A negative result may occur with improper specimen collection / handling, submission of specimen other than  nasopharyngeal swab, presence of viral mutation(s) within the areas targeted by this assay, and inadequate number of viral copies (<250 copies / mL). A negative result must be combined with clinical observations, patient history, and epidemiological information.  Fact Sheet for Patients:   StrictlyIdeas.no  Fact Sheet for Healthcare Providers: BankingDealers.co.za  This test is not yet approved or  cleared by the [place] FDA and has been authorized for detection and/or diagnosis of SARS-CoV-2 by FDA under an Emergency Use Authorization (EUA).  This EUA will remain in effect (meaning this test can be used) for the duration of the COVID-19 declaration under Section 564(b)(1) of the Act, 21 U.S.C. section 360bbb-3(b)(1), unless the authorization is terminated or revoked sooner.  Performed at [facility], [address], [place], [place] [address]   MRSA PCR Screening     Status: None   Collection Time: 01/26/20  4:32 PM   Specimen: Nasopharyngeal  Result  Value Ref Range Status   MRSA by PCR NEGATIVE NEGATIVE Final    Comment:        The GeneXpert MRSA Assay (FDA approved for NASAL specimens only), is one component of a comprehensive MRSA colonization surveillance program. It is not intended to diagnose MRSA infection nor to guide or monitor treatment for MRSA infections. Performed at [facility], [address] [place], [place] [address]     Radiology Reports CT ABDOMEN PELVIS W CONTRAST  Result Date: 01/26/2020 CLINICAL DATA:  Abdominal abscess/infection suspected. Nausea, vomiting. Patient drank an unknown amount of lack of since yesterday. History of kidney stones. EXAM: CT ABDOMEN AND PELVIS WITH CONTRAST TECHNIQUE: Multidetector CT imaging of the abdomen and pelvis was performed using the standard protocol following bolus administration of intravenous contrast. CONTRAST:  154mL OMNIPAQUE IOHEXOL 300 MG/ML  SOLN COMPARISON:  10/28/2019 FINDINGS: Lower chest: Lung bases are unremarkable. Hepatobiliary: There is severe hepatic steatosis. No focal liver lesion identified. Gallbladder is present. Pancreas: There is fluid surrounding the pancreatic head, body, and tail. There is stranding within the retroperitoneum. Pancreatic parenchyma enhances normally. No discrete pseudocysts. Spleen: Normal in size without focal abnormality. Adrenals/Urinary Tract: Normal adrenal glands. Kidneys and ureters are unremarkable. The bladder and visualized portion of the urethra are normal. Stomach/Bowel: Stomach is unremarkable. There is fluid surrounding the duodenal, adjacent to the pancreatic head and likely secondary to pancreatitis rather than duodenal disease. The remainder of the small bowel loops are unremarkable. Normal appendix. Vascular/Lymphatic: There is atherosclerotic calcification of the abdominal aorta. No aneurysm. There is normal vascular opacification of the celiac axis, superior mesenteric artery, and inferior mesenteric artery. Normal  appearance of the portal venous system and inferior vena cava. No retroperitoneal or mesenteric adenopathy. Reproductive: Uterus is present. RIGHT ovarian dermoid is 2.0 centimeters, stable in appearance. LEFT adnexal region is unremarkable. Other: No free pelvic fluid. Anterior abdominal wall is unremarkable. Musculoskeletal: Remote posterior fusion of L5-S1. IMPRESSION: 1. Findings are consistent with acute pancreatitis. No pancreatic necrosis or discrete pseudocysts. 2. Severe hepatic steatosis. 3. Stable appearance of RIGHT ovarian dermoid. 4. Remote posterior fusion of L5-S1. 5. Aortic Atherosclerosis (ICD10-I70.0). Electronically Signed   By: Nolon Nations M.D.   On: 01/26/2020 15:46   US Abdomen Limited RUQ  Result Date: 01/07/2020 CLINICAL DATA:  Mid to upper abdominal pain with vomiting for 1 day. EXAM: ULTRASOUND ABDOMEN LIMITED RIGHT UPPER QUADRANT COMPARISON:  10/28/2019 CT abdomen/pelvis. FINDINGS: Gallbladder: Layering sludge in the nondistended gallbladder. No shadowing gallstones. No gallbladder wall thickening. No pericholecystic fluid. No sonographic Murphy sign.  Common bile duct: Diameter: 6 mm Liver: Liver parenchyma is diffusely echogenic with posterior acoustic attenuation, in keeping with diffuse hepatic steatosis. No definite liver surface irregularity. No liver masses, noting decreased sensitivity in the setting of an echogenic liver. Portal vein is patent on color Doppler imaging with normal direction of blood flow towards the liver. Other: None. IMPRESSION: 1. Gallbladder sludge.  No cholelithiasis.  No acute cholecystitis. 2. Top normal caliber common bile duct, 6 mm diameter. 3. Diffuse hepatic steatosis. Electronically Signed   By: Ilona Sorrel M.D.   On: 01/07/2020 10:08     CBC Recent Labs  Lab 01/26/20 1158 01/27/20 0422 01/28/20 0509  WBC 20.0* 10.6* 6.3  HGB 15.0 13.5 13.2  HCT 45.5 39.4 40.5  PLT 202 110* 90*  MCV 105.8* 103.4* 105.5*  MCH 34.9* 35.4* 34.4*    MCHC 33.0 34.3 32.6  RDW 13.7 13.2 13.0  LYMPHSABS 1.4  --   --   MONOABS 0.7  --   --   EOSABS 0.0  --   --   BASOSABS 0.1  --   --     Chemistries  Recent Labs  Lab 01/26/20 1158 01/26/20 1216 01/27/20 0422 01/28/20 0509  NA 143  --  133* 134*  K 3.4*  --  3.2* 3.9  CL 98  --  94* 100  CO2 18*  --  25 23  GLUCOSE 100*  --  118* 84  BUN 6  --  <5* 5*  CREATININE 0.53  --  0.33* 0.34*  CALCIUM 9.3  --  8.0* 8.8*  MG  --  1.2*  --  1.7  AST 107*  --  64* 41  ALT 59*  --  41 33  ALKPHOS 103  --  84 83  BILITOT 0.7  --  1.0 1.1   ------------------------------------------------------------------------------------------------------------------ No results for input(s): CHOL, HDL, LDLCALC, TRIG, CHOLHDL, LDLDIRECT in the last 72 hours.  No results found for: HGBA1C ------------------------------------------------------------------------------------------------------------------ No results for input(s): TSH, T4TOTAL, T3FREE, THYROIDAB in the last 72 hours.  Invalid input(s): FREET3 ------------------------------------------------------------------------------------------------------------------ No results for input(s): VITAMINB12, FOLATE, FERRITIN, TIBC, IRON, RETICCTPCT in the last 72 hours.  Coagulation profile No results for input(s): INR, PROTIME in the last 168 hours.  No results for input(s): DDIMER in the last 72 hours.  Cardiac Enzymes No results for input(s): CKMB, TROPONINI, MYOGLOBIN in the last 168 hours.  Invalid input(s): CK ------------------------------------------------------------------------------------------------------------------ No results found for: BNP   Roxan Hockey M.D on 01/28/2020 at 9:35 AM  Go to [email or web address] - for contact info  [facility] - Office  [phone]

## 2020-01-29 DIAGNOSIS — K700 Alcoholic fatty liver: Secondary | ICD-10-CM | POA: Diagnosis present

## 2020-01-29 MED ORDER — LORAZEPAM 2 MG/ML IJ SOLN
2.0000 mg | Freq: Once | INTRAMUSCULAR | Status: AC
  Administered 2020-01-29: 2 mg via INTRAVENOUS
  Filled 2020-01-29: qty 1

## 2020-01-29 NOTE — Discharge Summary (Addendum)
Olivia Lester, is a 44 y.o. female  DOB [birth year]  MRN [id number].  Admission date:  01/26/2020  Admitting Physician  Neena Rhymes, MD  Discharge Date:  01/29/2020   Primary MD  Patient, No Pcp Per  Recommendations for primary care physician for things to follow:   -Patient left [place] prior to my drug being completed  Admission Diagnosis  Recurrent pancreatitis [K85.90] Alcohol-induced acute pancreatitis, unspecified complication status [X03.83]   Discharge Diagnosis  Recurrent pancreatitis [K85.90] Alcohol-induced acute pancreatitis, unspecified complication status [F38.32]    Active Problems:   Hypomagnesemia   Alcoholic hepatitis without ascites--Severe hepatic steatosis   Fatty liver, alcoholic/Severe hepatic steatosis   Alcohol withdrawal (HCC)   Essential hypertension   Alcoholism ([place])   Pancreatitis, recurrent      Past Medical History:  Diagnosis Date  . Alcohol abuse   . Back injury   . Gastric ulcer   . Gastric ulcer 10/14/2013  . Hepatitis C 10/14/2013  . History of kidney stones   . 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 Pancreatitis, acute   . Panic attack   . Pneumonia   . Polysubstance abuse ([place])   . PONV (postoperative nausea and vomiting)   . Seizures ([place])     Past Surgical History:  Procedure Laterality Date  . BACK SURGERY    . BREAST SURGERY    . ESOPHAGOGASTRODUODENOSCOPY (EGD) WITH PROPOFOL N/A 10/14/2013   Procedure: ESOPHAGOGASTRODUODENOSCOPY (EGD) WITH PROPOFOL;  Surgeon: Rogene Houston, MD;  Location: [facility];  Service: Endoscopy;  Laterality: N/A;  . ORIF TOE FRACTURE Right 08/08/2018   Procedure: RIGHT FOOT INTERNAL FIXATION 5TH METATARSAL;  Surgeon: Newt Minion, MD;  Location: [facility];  Service: Orthopedics;  Laterality: Right;  . TONSILLECTOMY       HPI  from the history and physical done on the day of admission:     Chief Complaint: Nausea/vomiting, abdominal pain  HPI: Olivia Lester is a 44 y.o. female with medical history significant of EtOH abuse, tobacco abuse, and THC, and benzo abuse as well asHTN,history of hep C,anxiety disorder, status post prior ORIF of right ankle, history of alcoholic esophageal gastro duodenitis and chronic alcohol induced thrombocytopenia. Patient reports typically drinking an 18 pack or half a gallon of liquor daily, her last drink was 24 hrs prior to admission. She developed N/V, shaking, abdominal pain  and presents to [facility] for evaluation. Her visit hx is extensive: June 15th for hypokalemia, EtOH abuse; May 9th facial laceration; April 5th hypomagnesemia and EtOH abuse ; April 2nd after a fall, EtOH abuse; Feb 10th Alcohol withdrawal; Nov 30th 2020 Alcohol withdrawal; Sept 6th 2020 pnacreatitis and alcohol withdrawal.  ED Course: At presentation Afebrile but hypertensive to 164/100. Initial lab revealed Mg 1.2, Alt 59, AST 207, Lipase 1379, WBC 20  88/7/4, Hgb 15, Troponon negative x 2. She was started on IVF, given pain medication. Sent for imaging of abdomen results pending. TR[facility] called to admit for acute on  chronic pancreatitis management and high risk of EtOH withdrawal.   Review of Systems: As per HPI otherwise 10 point review of systems negative. Patient endorses substernal chest pain w/o radiation, diaphoresis; she has pleuritic pain with deep inspiration; c/o ankle pain    Hospital Course:     Please note the patient was seen, examined and care plan was discussed with her with RN at bedside prior to patient deciding to leave AMA    Brief Summary: 44 year old femalewith past medical history relevant for EtOH abuse, tobacco abuse, and THC, and benzo abuse as well asHTN,history of hep C,anxiety disorder, status post prior ORIF of right ankle, history of alcoholic esophageal gastro duodenitis, severe hepatic steatosis and chronic alcohol induced thrombocytopenia,  recurrent episodes of alcoholic pancreatitis -Readmitted on 01/26/2020 with flareup of pancreatitis and electrolyte abnormalities in the setting of ongoing alcohol abuse Patient reports typically drinking an 18 pack or half a gallon of liquor daily--- -- its been a long time since patient went a couple of days without drinking-- --patient is not interested in alcohol rehab or drug rehab -Patient was previously on liquid diet --I attempted to have patient try soft diet prior to discharge, however -, she refused to try soft diet.  She wanted real solid food right away, she also requested and wanted IV narcotics, Patient left [place] prior to my drug being completed  A/p 1)Hypokalemia and Severe Hypomagnesiemia---suspect due to GI losses in an alcoholic female with some degree of possible pancreatic insufficiency and chronic diarrhea Mag is up to 1.7 from 1.2, give additional IV mag -Replaced IV and orally as tolerated  2)Acute on chronic pancreatitis with abdominal pain andNausea--- -Lipase is down to 1,379 >>> 698 She was treated with needed opiates and as needed antiemetics She was advance to full liquid ---patient is not interested in alcohol rehab or drug rehab -Patient was previously on liquid diet --I attempted to have patient try soft diet prior to discharge, however -, she refused to try soft diet.  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 seizure ----Patient states she is not really ready to quit drinking now -She lives with her significant other who drinks heavily too -We used lorazepam per [facility]WA protocol, thiamine and folic acid as ordered  7)ChronicThrombocytopenia --from EtOH abuse,   8)Hypertension -notcompliant withPTAAmlodipine -Compliance with amlodipine advised We used as needed iv hydralazine   Disposition----- --patient is not interested in alcohol rehab or drug rehab -Patient was previously on liquid diet --I attempted to have patient try soft diet prior to discharge, however -, she refused to try soft diet.  She wanted real solid food right away, she also requested and wanted IV narcotics, Patient left [place] prior to my drug being completed   Code Status : full  Family Communication:   (patient is alert, awake and coherent)   Consults  :  na  Discharge Condition: --patient is not interested in alcohol rehab or drug rehab -Patient was previously on liquid diet --I attempted to have patient  try soft diet prior to discharge, however -, she refused to try soft diet.  She wanted real solid food right away, she also requested and wanted IV narcotics, Patient left [place] prior to my drug being completed  Follow UP--PCP as advised  Diet and Activity recommendation:  As advised  Discharge Instructions    v--patient is not interested in alcohol rehab or drug rehab -Patient was previously on liquid diet --I attempted to have patient try soft diet prior to discharge, however -, she refused to try soft diet.  She wanted real solid food right away, she also requested and wanted IV narcotics, Patient left [place] prior to my drug being completed    Discharge Medications   Patient left AGAINST MEDICAL ADVICE prior to reconciliation of her med  rec   Major procedures and Radiology Reports - PLEASE review detailed and final reports for all details, in brief -   CT ABDOMEN PELVIS W CONTRAST  Result Date: 01/26/2020 CLINICAL DATA:  Abdominal abscess/infection suspected. Nausea, vomiting. Patient drank an unknown amount of lack of since yesterday. History of kidney stones. EXAM: CT ABDOMEN AND PELVIS WITH CONTRAST TECHNIQUE: Multidetector CT imaging of the abdomen and pelvis was performed using the standard protocol following bolus administration of intravenous contrast. CONTRAST:  169mL OMNIPAQUE IOHEXOL 300 MG/ML  SOLN COMPARISON:  10/28/2019 FINDINGS: Lower chest: Lung bases are unremarkable. Hepatobiliary: There is severe hepatic steatosis. No focal liver lesion identified. Gallbladder is present. Pancreas: There is fluid surrounding the pancreatic head, body, and tail. There is stranding within the retroperitoneum. Pancreatic parenchyma enhances normally. No discrete pseudocysts. Spleen: Normal in size without focal abnormality. Adrenals/Urinary Tract: Normal adrenal glands. Kidneys and ureters are unremarkable. The bladder and visualized portion of the urethra are normal. Stomach/Bowel: Stomach is unremarkable. There is fluid surrounding the duodenal, adjacent to the pancreatic head and likely secondary to pancreatitis rather than duodenal disease. The remainder of the small bowel loops are unremarkable. Normal appendix. Vascular/Lymphatic: There is atherosclerotic calcification of the abdominal aorta. No aneurysm. There is normal vascular opacification of the celiac axis, superior mesenteric artery, and inferior mesenteric artery. Normal appearance of the portal venous system and inferior vena cava. No retroperitoneal or mesenteric adenopathy. Reproductive: Uterus is present. RIGHT ovarian dermoid is 2.0 centimeters, stable in appearance. LEFT adnexal region is unremarkable. Other: No free pelvic fluid. Anterior abdominal wall is unremarkable.  Musculoskeletal: Remote posterior fusion of L5-S1. IMPRESSION: 1. Findings are consistent with acute pancreatitis. No pancreatic necrosis or discrete pseudocysts. 2. Severe hepatic steatosis. 3. Stable appearance of RIGHT ovarian dermoid. 4. Remote posterior fusion of L5-S1. 5. Aortic Atherosclerosis (ICD10-I70.0). Electronically Signed   By: Nolon Nations M.D.   On: 01/26/2020 15:46   US Abdomen Limited RUQ  Result Date: 01/07/2020 CLINICAL DATA:  Mid to upper abdominal pain with vomiting for 1 day. EXAM: ULTRASOUND ABDOMEN LIMITED RIGHT UPPER QUADRANT COMPARISON:  10/28/2019 CT abdomen/pelvis. FINDINGS: Gallbladder: Layering sludge in the nondistended gallbladder. No shadowing gallstones. No gallbladder wall thickening. No pericholecystic fluid. No sonographic Murphy sign. Common bile duct: Diameter: 6 mm Liver: Liver parenchyma is diffusely echogenic with posterior acoustic attenuation, in keeping with diffuse hepatic steatosis. No definite liver surface irregularity. No liver masses, noting decreased sensitivity in the setting of an echogenic liver. Portal vein is patent on color Doppler imaging with normal direction of blood flow towards the liver. Other: None. IMPRESSION: 1. Gallbladder sludge.  No cholelithiasis.  No acute cholecystitis. 2. Top normal caliber common bile  duct, 6 mm diameter. 3. Diffuse hepatic steatosis. Electronically Signed   By: Ilona Sorrel M.D.   On: 01/07/2020 10:08   Micro Results   Recent Results (from the past 240 hour(s))  SARS Coronavirus 2 by RT PCR (hospital order, performed in [facility] hospital lab) Nasopharyngeal Nasopharyngeal Swab     Status: None   Collection Time: 01/26/20  2:03 PM   Specimen: Nasopharyngeal Swab  Result Value Ref Range Status   SARS Coronavirus 2 NEGATIVE NEGATIVE Final    Comment: (NOTE) SARS-CoV-2 target nucleic acids are NOT DETECTED.  The SARS-CoV-2 RNA is generally detectable in upper and lower respiratory specimens during the  acute phase of infection. The lowest concentration of SARS-CoV-2 viral copies this assay can detect is 250 copies / mL. A negative result does not preclude SARS-CoV-2 infection and should not be used as the sole basis for treatment or other patient management decisions.  A negative result may occur with improper specimen collection / handling, submission of specimen other than nasopharyngeal swab, presence of viral mutation(s) within the areas targeted by this assay, and inadequate number of viral copies (<250 copies / mL). A negative result must be combined with clinical observations, patient history, and epidemiological information.  Fact Sheet for Patients:   StrictlyIdeas.no  Fact Sheet for Healthcare Providers: BankingDealers.co.za  This test is not yet approved or  cleared by the [place] FDA and has been authorized for detection and/or diagnosis of SARS-CoV-2 by FDA under an Emergency Use Authorization (EUA).  This EUA will remain in effect (meaning this test can be used) for the duration of the COVID-19 declaration under Section 564(b)(1) of the Act, 21 U.S.C. section 360bbb-3(b)(1), unless the authorization is terminated or revoked sooner.  Performed at [facility], [address], [place], [place] [address]   MRSA PCR Screening     Status: None   Collection Time: 01/26/20  4:32 PM   Specimen: Nasopharyngeal  Result Value Ref Range Status   MRSA by PCR NEGATIVE NEGATIVE Final    Comment:        The GeneXpert MRSA Assay (FDA approved for NASAL specimens only), is one component of a comprehensive MRSA colonization surveillance program. It is not intended to diagnose MRSA infection nor to guide or monitor treatment for MRSA infections. Performed at [facility], [address], [place], [place] [address]        Today   Subjective    Olivia Lester today has no new complaints  No Nausea, Vomiting or Diarrhea No  fever  Or chills  ---patient is not interested in alcohol rehab or drug rehab -Patient was previously on liquid diet --I attempted to have patient try soft diet prior to discharge, however -, she refused to try soft diet.  She wanted real solid food right away, she also requested and wanted IV narcotics, Patient left [place] prior to my drug being completed           Please note the patient was seen, examined and care plan was discussed with her with RN at bedside prior to patient deciding to leave AMA    Patient has been seen and examined prior to discharge   Objective   Blood pressure (!) 148/107, pulse 100, temperature 98.5 F (36.9 C), temperature source Oral, resp. rate 15, height 5\' 6"  (1.676 m), weight 54.8 kg, SpO2 100 %.   Intake/Output Summary (Last 24 hours) at 01/29/2020 1048 Last data filed at 01/29/2020 0600 Gross per 24  hour  Intake 660 ml  Output 600 ml  Net 60 ml   Exam Gen:- Awake Alert, no acute distress  HEENT:- .AT, No sclera icterus Neck-Supple Neck,No JVD,.  Lungs-  CTAB , good air movement bilaterally  CV- S1, S2 normal, regular Abd-  +ve B.Sounds, Abd Soft, much improved abdominal tenderness without rebound or guarding Extremity/Skin:- No  edema,   good pulses Psych-affect is appropriate, oriented x3 Neuro-no new focal deficits, mostly resolved tremors    Data Review   CBC w Diff:  Lab Results  Component Value Date   WBC 6.3 01/28/2020   HGB 13.2 01/28/2020   HGB 14.2 12/07/2013   HCT 40.5 01/28/2020   HCT 41.6 12/07/2013   PLT 90 (L) 01/28/2020   PLT 155 12/07/2013   LYMPHOPCT 7 01/26/2020   LYMPHOPCT 55.1 12/07/2013   MONOPCT 4 01/26/2020   MONOPCT 3.9 12/07/2013   EOSPCT 0 01/26/2020   EOSPCT 1.1 12/07/2013   BASOPCT 1 01/26/2020   BASOPCT 1.3 12/07/2013    CMP:  Lab Results  Component Value Date   NA 134 (L) 01/28/2020   NA 141 08/30/2017   NA 142 12/07/2013   K 3.9 01/28/2020   K 3.7 12/07/2013   CL 100  01/28/2020   CL 107 12/07/2013   CO2 23 01/28/2020   CO2 25 12/07/2013   BUN 5 (L) 01/28/2020   BUN 8 08/30/2017   BUN 7 12/07/2013   CREATININE 0.34 (L) 01/28/2020   CREATININE 0.46 (L) 12/07/2013   PROT 6.4 (L) 01/28/2020   PROT 7.3 08/30/2017   PROT 8.9 (H) 12/07/2013   ALBUMIN 3.4 (L) 01/28/2020   ALBUMIN 4.7 08/30/2017   ALBUMIN 4.6 12/07/2013   BILITOT 1.1 01/28/2020   BILITOT 0.2 08/30/2017   BILITOT 0.5 12/07/2013   ALKPHOS 83 01/28/2020   ALKPHOS 96 12/07/2013   AST 41 01/28/2020   AST 365 (H) 12/07/2013   ALT 33 01/28/2020   ALT 284 (H) 12/07/2013  .   Please note the patient was seen, examined and care plan was discussed with her with RN at bedside prior to patient deciding to leave AMA   Total Discharge time is about 33 minutes  Roxan Hockey M.D on 01/29/2020 at 10:48 AM  Go to [email or web address] -  for contact info  [facility] - Office  [phone]

## 2020-01-29 NOTE — Progress Notes (Signed)
Pt informed she would be discharged today and advised doctor would like her to stay for lunch and eat a soft diet prior to discharge. Pt informed she would not be getting IV pain medications. Pt refused to wait on discharge paperwork to be completed and prescription to be obtained. Pt called her ride and they were on the way. MD made aware. Pt signed AMA paperwork prior to leaving. IV and monitor was taken out. Pt ambulated herself off unit.

## 2020-02-17 ENCOUNTER — Emergency Department (HOSPITAL_COMMUNITY)
Admission: EM | Admit: 2020-02-17 | Discharge: 2020-02-17 | Disposition: A | Discharge status: Home / Self Care | Payer: Medicaid Other | Attending: Emergency Medicine | Admitting: Emergency Medicine

## 2020-02-17 ENCOUNTER — Other Ambulatory Visit: Payer: Self-pay

## 2020-02-17 ENCOUNTER — Encounter (HOSPITAL_COMMUNITY): Payer: Self-pay | Admitting: *Deleted

## 2020-02-17 DIAGNOSIS — Z79899 Other long term (current) drug therapy: Secondary | ICD-10-CM | POA: Insufficient documentation

## 2020-02-17 DIAGNOSIS — F1010 Alcohol abuse, uncomplicated: Secondary | ICD-10-CM

## 2020-02-17 DIAGNOSIS — R112 Nausea with vomiting, unspecified: Secondary | ICD-10-CM

## 2020-02-17 DIAGNOSIS — I10 Essential (primary) hypertension: Secondary | ICD-10-CM | POA: Insufficient documentation

## 2020-02-17 DIAGNOSIS — B86 Scabies: Secondary | ICD-10-CM

## 2020-02-17 LAB — CBC
HCT: 47.1 % — ABNORMAL HIGH (ref 36.0–46.0)
Hemoglobin: 15.2 g/dL — ABNORMAL HIGH (ref 12.0–15.0)
MCH: 33.7 pg (ref 26.0–34.0)
MCHC: 32.3 g/dL (ref 30.0–36.0)
MCV: 104.4 fL — ABNORMAL HIGH (ref 80.0–100.0)
Platelets: 181 10*3/uL (ref 150–400)
RBC: 4.51 MIL/uL (ref 3.87–5.11)
RDW: 13 % (ref 11.5–15.5)
WBC: 7.9 10*3/uL (ref 4.0–10.5)
nRBC: 0 % (ref 0.0–0.2)

## 2020-02-17 LAB — POC URINE PREG, ED: Preg Test, Ur: NEGATIVE

## 2020-02-17 LAB — ETHANOL: Alcohol, Ethyl (B): <10 mg/dL (ref <10)

## 2020-02-17 LAB — COMPREHENSIVE METABOLIC PANEL
ALT: 35 U/L (ref 0–44)
AST: 57 U/L — ABNORMAL HIGH (ref 15–41)
Albumin: 4.2 g/dL (ref 3.5–5.0)
Alkaline Phosphatase: 86 U/L (ref 38–126)
Anion gap: 19 — ABNORMAL HIGH (ref 5–15)
BUN: 9 mg/dL (ref 6–20)
CO2: 20 mmol/L — ABNORMAL LOW (ref 22–32)
Calcium: 8.8 mg/dL — ABNORMAL LOW (ref 8.9–10.3)
Chloride: 96 mmol/L — ABNORMAL LOW (ref 98–111)
Creatinine, Ser: 0.57 mg/dL (ref 0.44–1.00)
GFR calc Af Amer: >60 mL/min (ref >60)
GFR calc non Af Amer: >60 mL/min (ref >60)
Glucose, Bld: 93 mg/dL (ref 70–99)
Potassium: 3.6 mmol/L (ref 3.5–5.1)
Sodium: 135 mmol/L (ref 135–145)
Total Bilirubin: 1.2 mg/dL (ref 0.3–1.2)
Total Protein: 7.7 g/dL (ref 6.5–8.1)

## 2020-02-17 LAB — LIPASE, BLOOD: Lipase: 37 U/L (ref 11–51)

## 2020-02-17 LAB — TROPONIN I (HIGH SENSITIVITY): Troponin I (High Sensitivity): 11 ng/L (ref <18)

## 2020-02-17 LAB — MAGNESIUM: Magnesium: 1.3 mg/dL — ABNORMAL LOW (ref 1.7–2.4)

## 2020-02-17 MED ORDER — FENTANYL CITRATE (PF) 100 MCG/2ML IJ SOLN
50.0000 ug | Freq: Once | INTRAMUSCULAR | Status: AC
  Administered 2020-02-17: 50 ug via INTRAVENOUS
  Filled 2020-02-17: qty 2

## 2020-02-17 MED ORDER — ONDANSETRON 4 MG PO TBDP: 4.0000 mg | ORAL_TABLET | Freq: Three times a day (TID) | ORAL | 0 refills | Status: DC | PRN

## 2020-02-17 MED ORDER — MAGNESIUM SULFATE 2 GM/50ML IV SOLN
2.0000 g | Freq: Once | INTRAVENOUS | Status: AC
  Administered 2020-02-17: 2 g via INTRAVENOUS
  Filled 2020-02-17: qty 50

## 2020-02-17 MED ORDER — LORAZEPAM 2 MG/ML IJ SOLN
0.5000 mg | Freq: Once | INTRAMUSCULAR | Status: AC
  Administered 2020-02-17: 0.5 mg via INTRAVENOUS
  Filled 2020-02-17: qty 1

## 2020-02-17 MED ORDER — CHLORDIAZEPOXIDE HCL 25 MG PO CAPS
ORAL_CAPSULE | ORAL | 0 refills | Status: DC
Start: 2020-02-17 — End: 2020-04-23

## 2020-02-17 MED ORDER — ONDANSETRON HCL 4 MG/2ML IJ SOLN
4.0000 mg | Freq: Once | INTRAMUSCULAR | Status: AC
  Administered 2020-02-17: 4 mg via INTRAVENOUS
  Filled 2020-02-17: qty 2

## 2020-02-17 MED ORDER — SODIUM CHLORIDE 0.9 % IV BOLUS
1000.0000 mL | Freq: Once | INTRAVENOUS | Status: AC
  Administered 2020-02-17: 1000 mL via INTRAVENOUS

## 2020-02-17 MED ORDER — PERMETHRIN 5 % EX CREA
TOPICAL_CREAM | CUTANEOUS | 1 refills | Status: DC
Start: 2020-02-17 — End: 2020-06-12

## 2020-02-17 MED ORDER — LORAZEPAM 2 MG/ML IJ SOLN
1.0000 mg | Freq: Once | INTRAMUSCULAR | Status: AC
  Administered 2020-02-17: 1 mg via INTRAVENOUS
  Filled 2020-02-17: qty 1

## 2020-02-17 NOTE — ED Notes (Signed)
Pt in bed, pt c/o abd pain, pt requests medication, pa notified, ativan given.  Pt then requests something more for pain, explained that this should help for all her symptoms.  Pt c/o nausea, crackers and water given for po challenge.

## 2020-02-17 NOTE — ED Notes (Signed)
Pt requests ice chips, pt verbalized understanding d/c instructions, pt ambulatory from dpt with steady gait.

## 2020-02-17 NOTE — ED Provider Notes (Signed)
[facility] EMERGENCY DEPARTMENT Provider Note   CSN: [id number] Arrival date & time: 02/17/20  1125     History Chief Complaint  Patient presents with  . Emesis    Olivia Lester is a 44 y.o. female with a history significant for daily EtOH abuse, stating she can drink a 12 pack or 1/5 of liquor most days, also history of pancreatitis, hepatitis C history of gastric ulcers, in addition of polysubstance abuse, also history of DTs presenting with increased tremor, nausea and uncontrolled vomiting along with upper abdominal pain which started yesterday morning.  She last drank 1/5 of liquor Saturday and then woke the next morning with the above symptoms which have been escalating to today.  She endorses having a "seizure" this morning, describing all over muscle "locking up" which lasted for about 15 minutes.  She was awake during this entire episode.  Her emesis has mostly been dry heaves, but she does endorse small strands of blood-tinged emesis.  She denies fevers or chills, she does endorse generalized fatigue and weakness.  She denies confusion, hallucinations, but feels she is on the verge of going into full DTs.  Of note she was admitted for alcohol induced pancreatitis on July 4.  The history is provided by the patient.       Past Medical History:  Diagnosis Date  . Alcohol abuse   . Back injury   . Gastric ulcer   . Gastric ulcer 10/14/2013  . Hepatitis C 10/14/2013  . History of kidney stones   . 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 Pancreatitis, acute   . Panic attack   . Pneumonia   . Polysubstance abuse ([place])   . PONV (postoperative nausea and vomiting)   . Seizures [facility])     Patient Active Problem List   Diagnosis Date Noted  . Fatty liver, alcoholic/Severe hepatic steatosis 01/29/2020  . Pancreatitis, recurrent 01/26/2020  . Colitis 10/28/2019  . Polysubstance abuse ([place]) 10/28/2019  . Hypomagnesemia 10/28/2019  . Alcoholic hepatitis without  ascites--Severe hepatic steatosis 10/28/2019  . Alcoholic gastritis 68/09/2120  . DTs (delirium tremens) ([place])   . Urinary tract infection without hematuria   . Alcohol intoxication ([place]) 09/04/2019  . Alcohol withdrawal ([place]) 06/24/2019  . Sinus tachycardia 06/24/2019  . Essential hypertension 06/24/2019  . Hypokalemia 03/31/2019  . Cannabis abuse 03/11/2019  . Elevated transaminase level   . Alcohol abuse   . Closed displaced fracture of fifth metatarsal bone of right foot 08/02/2018  . Dental abscess 12/25/2017  . Gastric ulcer 10/14/2013  . Transaminitis 10/14/2013  . Tobacco dependence 10/14/2013  . Hepatitis C 10/14/2013  . Thrombocytopenia ([place]) 10/13/2013  . Duodenitis 10/12/2013  . Alcoholism ([place]) 10/12/2013  . Abdominal pain, epigastric 10/12/2013  . ANKLE PAIN 01/01/2008    Past Surgical History:  Procedure Laterality Date  . BACK SURGERY    . BREAST SURGERY    . ESOPHAGOGASTRODUODENOSCOPY (EGD) WITH PROPOFOL N/A 10/14/2013   Procedure: ESOPHAGOGASTRODUODENOSCOPY (EGD) WITH PROPOFOL;  Surgeon: Rogene Houston, MD;  Location: [facility];  Service: Endoscopy;  Laterality: N/A;  . ORIF TOE FRACTURE Right 08/08/2018   Procedure: RIGHT FOOT INTERNAL FIXATION 5TH METATARSAL;  Surgeon: Newt Minion, MD;  Location: [place];  Service: Orthopedics;  Laterality: Right;  . TONSILLECTOMY       OB History    Gravida  7   Para  3   Term  3   Preterm  AB  4   Living  3     SAB  2   TAB  2   Ectopic      Multiple      Live Births  3           Family History  Problem Relation Age of Onset  . Cancer Mother 61       ovarian   . Hypertension Mother   . Stroke Mother   . Early death Father   . Alcohol abuse Father     Social History   Tobacco Use  . Smoking status: Current Every Day Smoker    Packs/day: 1.00    Years: 20.00    Pack years: 20.00    Types: Cigarettes  . Smokeless tobacco: Never Used  Vaping Use  . Vaping Use: Never used    Substance Use Topics  . Alcohol use: Yes    Alcohol/week: 18.0 standard drinks    Types: 18 Cans of beer per week    Comment: 1/5 -1/2 gallon of liqour daily or 18 beers daily  . Drug use: Yes    Types: Marijuana, Cocaine, Methamphetamines    Comment: denies current cocaine/meth...current mj    Home Medications Prior to Admission medications   Medication Sig Start Date End Date Taking? Authorizing Provider  amLODipine (NORVASC) 5 MG tablet Take 1 tablet (5 mg total) by mouth daily. Patient not taking: Reported on 01/26/2020 10/28/19   Roxan Hockey, MD  chlordiazePOXIDE (LIBRIUM) 25 MG capsule 50mg  PO TID x 1D, then 25-50mg  PO BID X 1D, then 25-50mg  PO QD X 1D 02/17/20   Labella Zahradnik, Almyra Free, PA-C  diphenhydrAMINE (BENADRYL) 25 mg capsule Take 25 mg by mouth daily as needed for allergies. Patient not taking: Reported on 01/26/2020    [provider]  folic acid (FOLVITE) 1 MG tablet Take 1 tablet (1 mg total) by mouth daily. Patient not taking: Reported on 01/07/2020 10/29/19   Roxan Hockey, MD  lipase/protease/amylase 24000-76000 units CPEP Take 1 capsule (24,000 Units total) by mouth 3 (three) times daily before meals. Patient not taking: Reported on 01/07/2020 10/29/19   Roxan Hockey, MD  Multiple Vitamin (MULTIVITAMIN WITH MINERALS) TABS tablet Take 1 tablet by mouth every other day.  Patient not taking: Reported on 01/07/2020    [provider]  naproxen sodium (ALEVE) 220 MG tablet Take 440 mg by mouth daily as needed.    [provider]  ondansetron (ZOFRAN ODT) 4 MG disintegrating tablet Take 1 tablet (4 mg total) by mouth every 8 (eight) hours as needed for nausea or vomiting. 02/17/20   Anahlia Iseminger, Almyra Free, PA-C  pantoprazole (PROTONIX) 40 MG tablet Take 1 tablet (40 mg total) by mouth daily. Patient not taking: Reported on 01/26/2020 10/29/19   Roxan Hockey, MD  permethrin (ELIMITE) 5 % cream Apply from neck down, leaving on for 10 hours then shower.  Repeat in 10 days  if symptoms persist 02/17/20   Evalee Jefferson, PA-C  thiamine 100 MG tablet Take 1 tablet (100 mg total) by mouth daily. Patient not taking: Reported on 01/07/2020 10/29/19   Roxan Hockey, MD    Allergies    Toradol [ketorolac tromethamine] and Asa [aspirin]  Review of Systems   Review of Systems  Constitutional: Negative for chills and fever.  HENT: Negative for congestion.   Eyes: Negative.   Respiratory: Positive for chest tightness. Negative for shortness of breath.   Cardiovascular: Negative for chest pain.  Gastrointestinal: Positive for abdominal pain, nausea  and vomiting.  Genitourinary: Negative.   Musculoskeletal: Positive for myalgias. Negative for arthralgias, joint swelling and neck pain.  Skin: Negative.  Negative for rash and wound.  Neurological: Positive for tremors and seizures. Negative for dizziness, weakness, light-headedness, numbness and headaches.  Psychiatric/Behavioral: Negative.     Physical Exam Updated Vital Signs BP (!) 143/108 (BP Location: Right Arm)   Pulse 88   Temp 98.3 F (36.8 C) (Oral)   Resp 18   Ht 5\' 10"  (1.778 m)   Wt 54.4 kg   SpO2 98%   BMI 17.22 kg/m   Physical Exam Vitals and nursing note reviewed.  Constitutional:      Appearance: She is well-developed.  HENT:     Head: Normocephalic and atraumatic.  Eyes:     Conjunctiva/sclera: Conjunctivae normal.  Cardiovascular:     Rate and Rhythm: Normal rate and regular rhythm.     Heart sounds: Normal heart sounds.  Pulmonary:     Effort: Pulmonary effort is normal.     Breath sounds: Normal breath sounds. No wheezing.  Abdominal:     General: Bowel sounds are normal. There is no distension.     Palpations: Abdomen is soft.     Tenderness: There is abdominal tenderness in the epigastric area and left upper quadrant. There is no guarding or rebound.  Musculoskeletal:        General: Normal range of motion.     Cervical back: Normal range of motion.  Skin:    General: Skin is  warm and dry.     Coloration: Skin is not pale.     Findings: Rash present. No erythema.     Comments: Scattered small papular rash around ankles, popliteal space, also on forearms including elbow creases and finger web spaces.  Neurological:     Mental Status: She is alert.     Cranial Nerves: Cranial nerves are intact.     Sensory: Sensation is intact.     Motor: Motor function is intact. No tremor.     ED Results / Procedures / Treatments   Labs (all labs ordered are listed, but only abnormal results are displayed) Labs Reviewed  COMPREHENSIVE METABOLIC PANEL - Abnormal; Notable for the following components:      Result Value   Chloride 96 (*)    CO2 20 (*)    Calcium 8.8 (*)    AST 57 (*)    Anion gap 19 (*)    All other components within normal limits  CBC - Abnormal; Notable for the following components:   Hemoglobin 15.2 (*)    HCT 47.1 (*)    MCV 104.4 (*)    All other components within normal limits  MAGNESIUM - Abnormal; Notable for the following components:   Magnesium 1.3 (*)    All other components within normal limits  LIPASE, BLOOD  ETHANOL  POC URINE PREG, ED  TROPONIN I (HIGH SENSITIVITY)    EKG None  Radiology No results found.  Procedures Procedures (including critical care time)  Medications Ordered in ED Medications  LORazepam (ATIVAN) injection 1 mg (1 mg Intravenous Given 02/17/20 1230)  sodium chloride 0.9 % bolus 1,000 mL (0 mLs Intravenous Stopped 02/17/20 1555)  ondansetron (ZOFRAN) injection 4 mg (4 mg Intravenous Given 02/17/20 1341)  fentaNYL (SUBLIMAZE) injection 50 mcg (50 mcg Intravenous Given 02/17/20 1342)  magnesium sulfate IVPB 2 g 50 mL (0 g Intravenous Stopped 02/17/20 1702)  LORazepam (ATIVAN) injection 0.5 mg (0.5 mg Intravenous Given 02/17/20  [facility])    ED Course  I have reviewed the triage vital signs and the nursing notes.  Pertinent labs & imaging results that were available during my care of the patient were reviewed by  me and considered in my medical decision making (see chart for details).    MDM Rules/Calculators/A&P                          Patient with history of alcohol abuse, history of DTs, presenting with nausea and vomiting, abdominal pain with her last EtOH intake 2 nights ago.  Abdominal exam is nonacute, no guarding, unchanged at reexam prior to discharge.  She has had no evidence of DTs here including no tremor, her vital signs have been stable with a normal pulse rate.  She was given Zofran and had no vomiting, was able to maintain p.o. intake.  She is interested in outpatient assistance for EtOH cessation.  She was given a prescription for Librium taper and outpatient referrals were given.  Prior to discharge she also pointed out a pruritic rash which has been spreading started at her ankles and now involving her upper extremities including finger webspaces.  She lives alone, no obvious exposures.  She has dogs which are indoors and treated for fleas.  Rash suggest possible scabies.  She was given a prescription for permethrin.  Her magnesium level was low here, this was replaced in addition to given IV fluids.  She felt improved at time of discharge. Final Clinical Impression(s) / ED Diagnoses Final diagnoses:  Non-intractable vomiting with nausea, unspecified vomiting type  Hypomagnesemia  ETOH abuse  Scabies    Rx / DC Orders ED Discharge Orders         Ordered    ondansetron (ZOFRAN ODT) 4 MG disintegrating tablet  Every 8 hours PRN     Discontinue  Reprint     02/17/20 1713    chlordiazePOXIDE (LIBRIUM) 25 MG capsule     Discontinue  Reprint     02/17/20 1713    permethrin (ELIMITE) 5 % cream     Discontinue  Reprint     02/17/20 1713           Evalee Jefferson, PA-C 02/17/20 1719    Davonna Belling, MD 02/18/20 [phone]

## 2020-02-17 NOTE — ED Triage Notes (Signed)
Pt states she has been vomiting since this am; pt states she had a seizure this and she thinks she is going through alcohol withdrawals, her last drink was yesterday am

## 2020-02-17 NOTE — Discharge Instructions (Signed)
Take the librium only if you are committed to not drinking any more alcohol.  See the resource guide for assistance with outpatient treatment options.

## 2020-02-17 NOTE — ED Notes (Signed)
Pt states that she is going to be d/c, pt requests more food and a soda.

## 2020-03-12 ENCOUNTER — Emergency Department (HOSPITAL_COMMUNITY)
Admission: EM | Admit: 2020-03-12 | Discharge: 2020-03-13 | Disposition: A | Discharge status: Home / Self Care | Payer: Self-pay | Attending: Emergency Medicine | Admitting: Emergency Medicine

## 2020-03-12 ENCOUNTER — Other Ambulatory Visit: Payer: Self-pay

## 2020-03-12 ENCOUNTER — Emergency Department (HOSPITAL_COMMUNITY): Payer: Self-pay

## 2020-03-12 DIAGNOSIS — R519 Headache, unspecified: Secondary | ICD-10-CM | POA: Insufficient documentation

## 2020-03-12 DIAGNOSIS — H539 Unspecified visual disturbance: Secondary | ICD-10-CM | POA: Insufficient documentation

## 2020-03-12 DIAGNOSIS — Y999 Unspecified external cause status: Secondary | ICD-10-CM | POA: Insufficient documentation

## 2020-03-12 DIAGNOSIS — S300XXA Contusion of lower back and pelvis, initial encounter: Secondary | ICD-10-CM | POA: Insufficient documentation

## 2020-03-12 DIAGNOSIS — Z5321 Procedure and treatment not carried out due to patient leaving prior to being seen by health care provider: Secondary | ICD-10-CM | POA: Insufficient documentation

## 2020-03-12 DIAGNOSIS — Y9289 Other specified places as the place of occurrence of the external cause: Secondary | ICD-10-CM | POA: Insufficient documentation

## 2020-03-12 DIAGNOSIS — Y9389 Activity, other specified: Secondary | ICD-10-CM | POA: Insufficient documentation

## 2020-03-12 LAB — URINALYSIS, ROUTINE W REFLEX MICROSCOPIC
Bilirubin Urine: NEGATIVE
Glucose, UA: NEGATIVE mg/dL
Hgb urine dipstick: NEGATIVE
Ketones, ur: NEGATIVE mg/dL
Leukocytes,Ua: NEGATIVE
Nitrite: NEGATIVE
Protein, ur: NEGATIVE mg/dL
Specific Gravity, Urine: 1.008 (ref 1.005–1.030)
pH: 5 (ref 5.0–8.0)

## 2020-03-12 LAB — I-STAT BETA HCG BLOOD, ED (MC, WL, AP ONLY): I-stat hCG, quantitative: <5 m[IU]/mL (ref <5)

## 2020-03-12 LAB — BASIC METABOLIC PANEL
Anion gap: 19 — ABNORMAL HIGH (ref 5–15)
BUN: 5 mg/dL — ABNORMAL LOW (ref 6–20)
CO2: 19 mmol/L — ABNORMAL LOW (ref 22–32)
Calcium: 9 mg/dL (ref 8.9–10.3)
Chloride: 106 mmol/L (ref 98–111)
Creatinine, Ser: 0.6 mg/dL (ref 0.44–1.00)
GFR calc Af Amer: >60 mL/min (ref >60)
GFR calc non Af Amer: >60 mL/min (ref >60)
Glucose, Bld: 82 mg/dL (ref 70–99)
Potassium: 3.7 mmol/L (ref 3.5–5.1)
Sodium: 144 mmol/L (ref 135–145)

## 2020-03-12 LAB — CBC
HCT: 45.4 % (ref 36.0–46.0)
Hemoglobin: 15.1 g/dL — ABNORMAL HIGH (ref 12.0–15.0)
MCH: 33 pg (ref 26.0–34.0)
MCHC: 33.3 g/dL (ref 30.0–36.0)
MCV: 99.1 fL (ref 80.0–100.0)
Platelets: 183 10*3/uL (ref 150–400)
RBC: 4.58 MIL/uL (ref 3.87–5.11)
RDW: 13.7 % (ref 11.5–15.5)
WBC: 6.7 10*3/uL (ref 4.0–10.5)
nRBC: 0 % (ref 0.0–0.2)

## 2020-03-12 NOTE — ED Notes (Signed)
Called pts name 3x for updated VS with no response. Pt not outside.
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
## 2020-03-30 ENCOUNTER — Emergency Department (HOSPITAL_COMMUNITY)
Admission: EM | Admit: 2020-03-30 | Discharge: 2020-03-30 | Disposition: A | Discharge status: Home / Self Care | Payer: Medicaid Other | Attending: Emergency Medicine | Admitting: Emergency Medicine

## 2020-03-30 ENCOUNTER — Other Ambulatory Visit: Payer: Self-pay

## 2020-03-30 ENCOUNTER — Encounter (HOSPITAL_COMMUNITY): Payer: Self-pay | Admitting: *Deleted

## 2020-03-30 DIAGNOSIS — F17210 Nicotine dependence, cigarettes, uncomplicated: Secondary | ICD-10-CM | POA: Insufficient documentation

## 2020-03-30 DIAGNOSIS — R1013 Epigastric pain: Secondary | ICD-10-CM | POA: Insufficient documentation

## 2020-03-30 DIAGNOSIS — Z79899 Other long term (current) drug therapy: Secondary | ICD-10-CM | POA: Insufficient documentation

## 2020-03-30 DIAGNOSIS — R112 Nausea with vomiting, unspecified: Secondary | ICD-10-CM | POA: Insufficient documentation

## 2020-03-30 DIAGNOSIS — I10 Essential (primary) hypertension: Secondary | ICD-10-CM | POA: Insufficient documentation

## 2020-03-30 LAB — URINALYSIS, ROUTINE W REFLEX MICROSCOPIC
Glucose, UA: NEGATIVE mg/dL
Ketones, ur: 80 mg/dL — AB
Leukocytes,Ua: NEGATIVE
Nitrite: NEGATIVE
Protein, ur: >=300 mg/dL — AB
Specific Gravity, Urine: 1.027 (ref 1.005–1.030)
pH: 6 (ref 5.0–8.0)

## 2020-03-30 LAB — COMPREHENSIVE METABOLIC PANEL
ALT: 124 U/L — ABNORMAL HIGH (ref 0–44)
AST: 398 U/L — ABNORMAL HIGH (ref 15–41)
Albumin: 4.7 g/dL (ref 3.5–5.0)
Alkaline Phosphatase: 86 U/L (ref 38–126)
Anion gap: 20 — ABNORMAL HIGH (ref 5–15)
BUN: 7 mg/dL (ref 6–20)
CO2: 25 mmol/L (ref 22–32)
Calcium: 9 mg/dL (ref 8.9–10.3)
Chloride: 84 mmol/L — ABNORMAL LOW (ref 98–111)
Creatinine, Ser: 0.61 mg/dL (ref 0.44–1.00)
GFR calc Af Amer: >60 mL/min (ref >60)
GFR calc non Af Amer: >60 mL/min (ref >60)
Glucose, Bld: 110 mg/dL — ABNORMAL HIGH (ref 70–99)
Potassium: 2.9 mmol/L — ABNORMAL LOW (ref 3.5–5.1)
Sodium: 129 mmol/L — ABNORMAL LOW (ref 135–145)
Total Bilirubin: 2.8 mg/dL — ABNORMAL HIGH (ref 0.3–1.2)
Total Protein: 8.3 g/dL — ABNORMAL HIGH (ref 6.5–8.1)

## 2020-03-30 LAB — LIPASE, BLOOD: Lipase: 46 U/L (ref 11–51)

## 2020-03-30 LAB — CBC
HCT: 48.9 % — ABNORMAL HIGH (ref 36.0–46.0)
Hemoglobin: 16.7 g/dL — ABNORMAL HIGH (ref 12.0–15.0)
MCH: 33.3 pg (ref 26.0–34.0)
MCHC: 34.2 g/dL (ref 30.0–36.0)
MCV: 97.6 fL (ref 80.0–100.0)
Platelets: 112 10*3/uL — ABNORMAL LOW (ref 150–400)
RBC: 5.01 MIL/uL (ref 3.87–5.11)
RDW: 14.5 % (ref 11.5–15.5)
WBC: 6.4 10*3/uL (ref 4.0–10.5)
nRBC: 0 % (ref 0.0–0.2)

## 2020-03-30 MED ORDER — ALUM & MAG HYDROXIDE-SIMETH 200-200-20 MG/5ML PO SUSP
30.0000 mL | Freq: Once | ORAL | Status: AC
  Administered 2020-03-30: 30 mL via ORAL
  Filled 2020-03-30: qty 30

## 2020-03-30 MED ORDER — PROMETHAZINE HCL 25 MG PO TABS: 25.0000 mg | ORAL_TABLET | Freq: Three times a day (TID) | ORAL | 0 refills | Status: DC | PRN

## 2020-03-30 MED ORDER — MORPHINE SULFATE (PF) 4 MG/ML IV SOLN
4.0000 mg | Freq: Once | INTRAVENOUS | Status: AC
  Administered 2020-03-30: 4 mg via INTRAVENOUS
  Filled 2020-03-30: qty 1

## 2020-03-30 MED ORDER — PROMETHAZINE HCL 25 MG/ML IJ SOLN
12.5000 mg | Freq: Once | INTRAMUSCULAR | Status: AC
  Administered 2020-03-30: 12.5 mg via INTRAVENOUS
  Filled 2020-03-30: qty 1

## 2020-03-30 MED ORDER — LORAZEPAM 2 MG/ML IJ SOLN
1.0000 mg | Freq: Once | INTRAMUSCULAR | Status: AC
  Administered 2020-03-30: 1 mg via INTRAMUSCULAR
  Filled 2020-03-30: qty 1

## 2020-03-30 MED ORDER — LORAZEPAM 2 MG/ML IJ SOLN: 1.0000 mg | Freq: Once | INTRAMUSCULAR | Status: DC

## 2020-03-30 MED ORDER — POTASSIUM CHLORIDE CRYS ER 20 MEQ PO TBCR
60.0000 meq | EXTENDED_RELEASE_TABLET | Freq: Once | ORAL | Status: AC
  Administered 2020-03-30: 60 meq via ORAL
  Filled 2020-03-30: qty 3

## 2020-03-30 MED ORDER — LACTATED RINGERS IV BOLUS
1000.0000 mL | Freq: Once | INTRAVENOUS | Status: AC
  Administered 2020-03-30: 1000 mL via INTRAVENOUS

## 2020-03-30 NOTE — ED Triage Notes (Signed)
Pt states she has been vomiting x one day

## 2020-03-31 LAB — POC URINE PREG, ED: Preg Test, Ur: NEGATIVE

## 2020-04-02 NOTE — ED Provider Notes (Signed)
[facility] EMERGENCY DEPARTMENT Provider Note   CSN: [id number] Arrival date & time: 03/30/20  8832     History Chief Complaint  Patient presents with  . Emesis    Olivia Lester is a 44 y.o. female.  HPI   45 year old female with abdominal pain and nausea/vomiting.  Onset yesterday.  Persistent since.  Pain is in the upper abdomen.  Radiates into the back.  She has a past history of alcohol abuse and pancreatitis.  She is concerned that she may have pancreatitis currently.  No fevers or chills.  No blood or coffee ground emesis.  No melena or bright red blood per rectum.  No fevers.  Has felt shaky.  No acute urinary complaints.  Past Medical History:  Diagnosis Date  . Alcohol abuse   . Back injury   . Gastric ulcer   . Gastric ulcer 10/14/2013  . Hepatitis C 10/14/2013  . History of kidney stones   . 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 Pancreatitis, acute   . Panic attack   . Pneumonia   . Polysubstance abuse ([place])   . PONV (postoperative nausea and vomiting)   . Seizures [facility])     Patient Active Problem List   Diagnosis Date Noted  . Fatty liver, alcoholic/Severe hepatic steatosis 01/29/2020  . Pancreatitis, recurrent 01/26/2020  . Colitis 10/28/2019  . Polysubstance abuse ([place]) 10/28/2019  . Hypomagnesemia 10/28/2019  . Alcoholic hepatitis without ascites--Severe hepatic steatosis 10/28/2019  . Alcoholic gastritis 54/98/2641  . DTs (delirium tremens) ([place])   . Urinary tract infection without hematuria   . Alcohol intoxication ([place]) 09/04/2019  . Alcohol withdrawal ([place]) 06/24/2019  . Sinus tachycardia 06/24/2019  . Essential hypertension 06/24/2019  . Hypokalemia 03/31/2019  . Cannabis abuse 03/11/2019  . Elevated transaminase level   . Alcohol abuse   . Closed displaced fracture of fifth metatarsal bone of right foot 08/02/2018  . Dental abscess 12/25/2017  . Gastric ulcer 10/14/2013  . Transaminitis 10/14/2013  . Tobacco dependence  10/14/2013  . Hepatitis C 10/14/2013  . Thrombocytopenia ([place]) 10/13/2013  . Duodenitis 10/12/2013  . Alcoholism ([place]) 10/12/2013  . Abdominal pain, epigastric 10/12/2013  . ANKLE PAIN 01/01/2008    Past Surgical History:  Procedure Laterality Date  . BACK SURGERY    . BREAST SURGERY    . ESOPHAGOGASTRODUODENOSCOPY (EGD) WITH PROPOFOL N/A 10/14/2013   Procedure: ESOPHAGOGASTRODUODENOSCOPY (EGD) WITH PROPOFOL;  Surgeon: Rogene Houston, MD;  Location: [facility];  Service: Endoscopy;  Laterality: N/A;  . ORIF TOE FRACTURE Right 08/08/2018   Procedure: RIGHT FOOT INTERNAL FIXATION 5TH METATARSAL;  Surgeon: Newt Minion, MD;  Location: [place];  Service: Orthopedics;  Laterality: Right;  . TONSILLECTOMY       OB History    Gravida  7   Para  3   Term  3   Preterm      AB  4   Living  3     SAB  2   TAB  2   Ectopic      Multiple      Live Births  3           Family History  Problem Relation Age of Onset  . Cancer Mother 26       ovarian   . Hypertension Mother   . Stroke Mother   . Early death Father   . Alcohol abuse Father     Social History   Tobacco  Use  . Smoking status: Current Every Day Smoker    Packs/day: 1.00    Years: 20.00    Pack years: 20.00    Types: Cigarettes  . Smokeless tobacco: Never Used  Vaping Use  . Vaping Use: Never used  Substance Use Topics  . Alcohol use: Yes    Alcohol/week: 18.0 standard drinks    Types: 18 Cans of beer per week    Comment: 1/5 -1/2 gallon of liqour daily or 18 beers daily  . Drug use: Yes    Types: Marijuana, Cocaine, Methamphetamines    Comment: denies current cocaine/meth...current mj    Home Medications Prior to Admission medications   Medication Sig Start Date End Date Taking? Authorizing Provider  naproxen sodium (ALEVE) 220 MG tablet Take 440 mg by mouth daily as needed.   Yes [provider]  amLODipine (NORVASC) 5 MG tablet Take 1 tablet (5 mg total) by mouth daily. Patient not  taking: Reported on 01/26/2020 10/28/19   Roxan Hockey, MD  chlordiazePOXIDE (LIBRIUM) 25 MG capsule 50mg  PO TID x 1D, then 25-50mg  PO BID X 1D, then 25-50mg  PO QD X 1D Patient not taking: Reported on 03/30/2020 02/17/20   Evalee Jefferson, PA-C  diphenhydrAMINE (BENADRYL) 25 mg capsule Take 25 mg by mouth daily as needed for allergies. Patient not taking: Reported on 01/26/2020    [provider]  folic acid (FOLVITE) 1 MG tablet Take 1 tablet (1 mg total) by mouth daily. Patient not taking: Reported on 01/07/2020 10/29/19   Roxan Hockey, MD  lipase/protease/amylase 24000-76000 units CPEP Take 1 capsule (24,000 Units total) by mouth 3 (three) times daily before meals. Patient not taking: Reported on 01/07/2020 10/29/19   Roxan Hockey, MD  Multiple Vitamin (MULTIVITAMIN WITH MINERALS) TABS tablet Take 1 tablet by mouth every other day.  Patient not taking: Reported on 01/07/2020    [provider]  ondansetron (ZOFRAN ODT) 4 MG disintegrating tablet Take 1 tablet (4 mg total) by mouth every 8 (eight) hours as needed for nausea or vomiting. Patient not taking: Reported on 03/30/2020 02/17/20   Evalee Jefferson, PA-C  pantoprazole (PROTONIX) 40 MG tablet Take 1 tablet (40 mg total) by mouth daily. Patient not taking: Reported on 01/26/2020 10/29/19   Roxan Hockey, MD  permethrin (ELIMITE) 5 % cream Apply from neck down, leaving on for 10 hours then shower.  Repeat in 10 days if symptoms persist Patient not taking: Reported on 03/30/2020 02/17/20   Evalee Jefferson, PA-C  promethazine (PHENERGAN) 25 MG tablet Take 1 tablet (25 mg total) by mouth every 8 (eight) hours as needed for nausea or vomiting. 03/30/20   Virgel Manifold, MD  thiamine 100 MG tablet Take 1 tablet (100 mg total) by mouth daily. Patient not taking: Reported on 01/07/2020 10/29/19   Roxan Hockey, MD    Allergies    Toradol [ketorolac tromethamine] and Diona Fanti [aspirin]  Review of Systems   Review of Systems All systems reviewed and  negative, other than as noted in HPI.  Physical Exam Updated Vital Signs BP (!) 172/110   Pulse (!) 111   Temp 98 F (36.7 C)   Resp 12   Ht 5\' 6"  (1.676 m)   Wt 49.9 kg   SpO2 99%   BMI 17.75 kg/m   Physical Exam Vitals and nursing note reviewed.  Constitutional:      General: She is not in acute distress.    Appearance: She is well-developed.     Comments: Laying in  bed.  Mildly tremulous.  No acute distress.  HENT:     Head: Normocephalic and atraumatic.  Eyes:     General:        Right eye: No discharge.        Left eye: No discharge.     Conjunctiva/sclera: Conjunctivae normal.  Cardiovascular:     Rate and Rhythm: Regular rhythm. Tachycardia present.     Heart sounds: Normal heart sounds. No murmur heard.  No friction rub. No gallop.   Pulmonary:     Effort: Pulmonary effort is normal. No respiratory distress.     Breath sounds: Normal breath sounds.  Abdominal:     General: There is no distension.     Palpations: Abdomen is soft.     Tenderness: There is abdominal tenderness.     Comments: ttp upper abdomen w/o rebound or guarding  Musculoskeletal:        General: No tenderness.     Cervical back: Neck supple.  Skin:    General: Skin is warm and dry.  Neurological:     Mental Status: She is alert.  Psychiatric:        Behavior: Behavior normal.        Thought Content: Thought content normal.     ED Results / Procedures / Treatments   Labs (all labs ordered are listed, but only abnormal results are displayed) Labs Reviewed  COMPREHENSIVE METABOLIC PANEL - Abnormal; Notable for the following components:      Result Value   Sodium 129 (*)    Potassium 2.9 (*)    Chloride 84 (*)    Glucose, Bld 110 (*)    Total Protein 8.3 (*)    AST 398 (*)    ALT 124 (*)    Total Bilirubin 2.8 (*)    Anion gap 20 (*)    All other components within normal limits  CBC - Abnormal; Notable for the following components:   Hemoglobin 16.7 (*)    HCT 48.9 (*)     Platelets 112 (*)    All other components within normal limits  URINALYSIS, ROUTINE W REFLEX MICROSCOPIC - Abnormal; Notable for the following components:   Color, Urine AMBER (*)    APPearance HAZY (*)    Hgb urine dipstick SMALL (*)    Bilirubin Urine SMALL (*)    Ketones, ur 80 (*)    Protein, ur >=300 (*)    Bacteria, UA RARE (*)    All other components within normal limits  LIPASE, BLOOD  POC URINE PREG, ED    EKG EKG Interpretation  Date/Time:  Monday March 30 2020 13:32:35 EDT Ventricular Rate:  68 PR Interval:    QRS Duration: 102 QT Interval:  452 QTC Calculation: 481 R Axis:   81 Text Interpretation: Sinus rhythm Short PR interval Borderline T wave abnormalities Confirmed by Virgel Manifold [phone]) on 03/30/2020 1:47:04 PM   Radiology No results found.  Procedures Procedures (including critical care time)  Medications Ordered in ED Medications  lactated ringers bolus 1,000 mL (0 mLs Intravenous Stopped 03/30/20 1601)  promethazine (PHENERGAN) injection 12.5 mg (12.5 mg Intravenous Given 03/30/20 1154)  morphine 4 MG/ML injection 4 mg (4 mg Intravenous Given 03/30/20 1130)  alum & mag hydroxide-simeth (MAALOX/MYLANTA) 200-200-20 MG/5ML suspension 30 mL (30 mLs Oral Given 03/30/20 1131)  LORazepam (ATIVAN) injection 1 mg (1 mg Intramuscular Given 03/30/20 1130)  potassium chloride SA (KLOR-CON) CR tablet 60 mEq (60 mEq Oral Given 03/30/20 1341)  ED Course  I have reviewed the triage vital signs and the nursing notes.  Pertinent labs & imaging results that were available during my care of the patient were reviewed by me and considered in my medical decision making (see chart for details).    MDM Rules/Calculators/A&P                          44 year old female with what I suspect is alcoholic gastritis.  She has some tenderness in the upper abdomen but no peritonitis.  Labs noted, but not unexpected given her history of alcohol abuse.  She is afebrile.  Feeling much  better after IV fluids, antiemetics and dose of Ativan.  Doubt acute surgical process.  Advised to abstain from alcohol use.  Return precautions were discussed.  Outpatient follow-up otherwise.  Final Clinical Impression(s) / ED Diagnoses Final diagnoses:  Epigastric pain  Nausea and vomiting, intractability of vomiting not specified, unspecified vomiting type    Rx / DC Orders ED Discharge Orders         Ordered    promethazine (PHENERGAN) 25 MG tablet  Every 8 hours PRN        03/30/20 1516           Virgel Manifold, MD 04/02/20 [phone]

## 2020-04-17 ENCOUNTER — Encounter (HOSPITAL_COMMUNITY): Payer: Self-pay | Admitting: *Deleted

## 2020-04-17 ENCOUNTER — Other Ambulatory Visit: Payer: Self-pay

## 2020-04-17 ENCOUNTER — Emergency Department (HOSPITAL_COMMUNITY): Payer: Self-pay

## 2020-04-17 ENCOUNTER — Inpatient Hospital Stay (HOSPITAL_COMMUNITY)
Admission: EM | Admit: 2020-04-17 | Discharge: 2020-04-23 | DRG: 871 | Disposition: A | Discharge status: Home / Self Care | Payer: Self-pay | Attending: Internal Medicine | Admitting: Internal Medicine

## 2020-04-17 DIAGNOSIS — J189 Pneumonia, unspecified organism: Secondary | ICD-10-CM | POA: Diagnosis present

## 2020-04-17 DIAGNOSIS — K760 Fatty (change of) liver, not elsewhere classified: Secondary | ICD-10-CM | POA: Diagnosis present

## 2020-04-17 DIAGNOSIS — E861 Hypovolemia: Secondary | ICD-10-CM | POA: Diagnosis present

## 2020-04-17 DIAGNOSIS — F329 Major depressive disorder, single episode, unspecified: Secondary | ICD-10-CM | POA: Diagnosis present

## 2020-04-17 DIAGNOSIS — Z20822 Contact with and (suspected) exposure to covid-19: Secondary | ICD-10-CM | POA: Diagnosis present

## 2020-04-17 DIAGNOSIS — R52 Pain, unspecified: Secondary | ICD-10-CM

## 2020-04-17 DIAGNOSIS — R160 Hepatomegaly, not elsewhere classified: Secondary | ICD-10-CM | POA: Diagnosis present

## 2020-04-17 DIAGNOSIS — G9341 Metabolic encephalopathy: Secondary | ICD-10-CM | POA: Diagnosis present

## 2020-04-17 DIAGNOSIS — F17200 Nicotine dependence, unspecified, uncomplicated: Secondary | ICD-10-CM | POA: Diagnosis present

## 2020-04-17 DIAGNOSIS — B9620 Unspecified Escherichia coli [E. coli] as the cause of diseases classified elsewhere: Secondary | ICD-10-CM | POA: Diagnosis present

## 2020-04-17 DIAGNOSIS — D696 Thrombocytopenia, unspecified: Secondary | ICD-10-CM | POA: Diagnosis present

## 2020-04-17 DIAGNOSIS — A4151 Sepsis due to Escherichia coli [E. coli]: Principal | ICD-10-CM | POA: Diagnosis present

## 2020-04-17 DIAGNOSIS — I10 Essential (primary) hypertension: Secondary | ICD-10-CM | POA: Diagnosis present

## 2020-04-17 DIAGNOSIS — A419 Sepsis, unspecified organism: Secondary | ICD-10-CM | POA: Diagnosis present

## 2020-04-17 DIAGNOSIS — F10920 Alcohol use, unspecified with intoxication, uncomplicated: Secondary | ICD-10-CM

## 2020-04-17 DIAGNOSIS — Z7141 Alcohol abuse counseling and surveillance of alcoholic: Secondary | ICD-10-CM

## 2020-04-17 DIAGNOSIS — F17210 Nicotine dependence, cigarettes, uncomplicated: Secondary | ICD-10-CM | POA: Diagnosis present

## 2020-04-17 DIAGNOSIS — Z888 Allergy status to other drugs, medicaments and biological substances status: Secondary | ICD-10-CM

## 2020-04-17 DIAGNOSIS — N10 Acute tubulo-interstitial nephritis: Secondary | ICD-10-CM | POA: Diagnosis present

## 2020-04-17 DIAGNOSIS — Z886 Allergy status to analgesic agent status: Secondary | ICD-10-CM

## 2020-04-17 DIAGNOSIS — G894 Chronic pain syndrome: Secondary | ICD-10-CM | POA: Diagnosis present

## 2020-04-17 DIAGNOSIS — Z681 Body mass index (BMI) 19 or less, adult: Secondary | ICD-10-CM

## 2020-04-17 DIAGNOSIS — F1210 Cannabis abuse, uncomplicated: Secondary | ICD-10-CM | POA: Diagnosis present

## 2020-04-17 DIAGNOSIS — F419 Anxiety disorder, unspecified: Secondary | ICD-10-CM | POA: Diagnosis present

## 2020-04-17 DIAGNOSIS — F10129 Alcohol abuse with intoxication, unspecified: Secondary | ICD-10-CM | POA: Diagnosis present

## 2020-04-17 DIAGNOSIS — K8689 Other specified diseases of pancreas: Secondary | ICD-10-CM | POA: Diagnosis present

## 2020-04-17 DIAGNOSIS — Z716 Tobacco abuse counseling: Secondary | ICD-10-CM

## 2020-04-17 DIAGNOSIS — R4781 Slurred speech: Secondary | ICD-10-CM | POA: Diagnosis present

## 2020-04-17 DIAGNOSIS — K2920 Alcoholic gastritis without bleeding: Secondary | ICD-10-CM | POA: Diagnosis present

## 2020-04-17 DIAGNOSIS — E871 Hypo-osmolality and hyponatremia: Secondary | ICD-10-CM | POA: Diagnosis present

## 2020-04-17 DIAGNOSIS — F1010 Alcohol abuse, uncomplicated: Secondary | ICD-10-CM | POA: Diagnosis present

## 2020-04-17 DIAGNOSIS — E440 Moderate protein-calorie malnutrition: Secondary | ICD-10-CM | POA: Diagnosis present

## 2020-04-17 DIAGNOSIS — R6520 Severe sepsis without septic shock: Secondary | ICD-10-CM | POA: Diagnosis present

## 2020-04-17 DIAGNOSIS — Z811 Family history of alcohol abuse and dependence: Secondary | ICD-10-CM

## 2020-04-17 DIAGNOSIS — R7401 Elevation of levels of liver transaminase levels: Secondary | ICD-10-CM | POA: Diagnosis present

## 2020-04-17 DIAGNOSIS — E876 Hypokalemia: Secondary | ICD-10-CM | POA: Diagnosis present

## 2020-04-17 DIAGNOSIS — B379 Candidiasis, unspecified: Secondary | ICD-10-CM | POA: Diagnosis not present

## 2020-04-17 DIAGNOSIS — Z79899 Other long term (current) drug therapy: Secondary | ICD-10-CM

## 2020-04-17 LAB — RESP PANEL BY RT PCR (RSV, FLU A&B, COVID)
Influenza A by PCR: NEGATIVE
Influenza B by PCR: NEGATIVE
Respiratory Syncytial Virus by PCR: NEGATIVE
SARS Coronavirus 2 by RT PCR: NEGATIVE

## 2020-04-17 LAB — COMPREHENSIVE METABOLIC PANEL WITH GFR
ALT: 55 U/L — ABNORMAL HIGH (ref 0–44)
AST: 58 U/L — ABNORMAL HIGH (ref 15–41)
Albumin: 2.5 g/dL — ABNORMAL LOW (ref 3.5–5.0)
Alkaline Phosphatase: 148 U/L — ABNORMAL HIGH (ref 38–126)
Anion gap: 16 — ABNORMAL HIGH (ref 5–15)
BUN: 5 mg/dL — ABNORMAL LOW (ref 6–20)
CO2: 26 mmol/L (ref 22–32)
Calcium: 7.6 mg/dL — ABNORMAL LOW (ref 8.9–10.3)
Chloride: 90 mmol/L — ABNORMAL LOW (ref 98–111)
Creatinine, Ser: 0.46 mg/dL (ref 0.44–1.00)
GFR calc Af Amer: >60 mL/min
GFR calc non Af Amer: >60 mL/min
Glucose, Bld: 77 mg/dL (ref 70–99)
Potassium: 2.9 mmol/L — ABNORMAL LOW (ref 3.5–5.1)
Sodium: 132 mmol/L — ABNORMAL LOW (ref 135–145)
Total Bilirubin: 1.3 mg/dL — ABNORMAL HIGH (ref 0.3–1.2)
Total Protein: 5.9 g/dL — ABNORMAL LOW (ref 6.5–8.1)

## 2020-04-17 LAB — CBC WITH DIFFERENTIAL/PLATELET
Abs Immature Granulocytes: 0.51 10*3/uL — ABNORMAL HIGH (ref 0.00–0.07)
Basophils Absolute: 0.1 10*3/uL (ref 0.0–0.1)
Basophils Relative: 1 %
Eosinophils Absolute: 0 10*3/uL (ref 0.0–0.5)
Eosinophils Relative: 0 %
HCT: 37.4 % (ref 36.0–46.0)
Hemoglobin: 12.5 g/dL (ref 12.0–15.0)
Immature Granulocytes: 3 %
Lymphocytes Relative: 8 %
Lymphs Abs: 1.3 10*3/uL (ref 0.7–4.0)
MCH: 32.9 pg (ref 26.0–34.0)
MCHC: 33.4 g/dL (ref 30.0–36.0)
MCV: 98.4 fL (ref 80.0–100.0)
Monocytes Absolute: 0.8 10*3/uL (ref 0.1–1.0)
Monocytes Relative: 5 %
Neutro Abs: 13.5 10*3/uL — ABNORMAL HIGH (ref 1.7–7.7)
Neutrophils Relative %: 83 %
Platelets: 191 10*3/uL (ref 150–400)
RBC: 3.8 MIL/uL — ABNORMAL LOW (ref 3.87–5.11)
RDW: 15.9 % — ABNORMAL HIGH (ref 11.5–15.5)
WBC: 16.2 10*3/uL — ABNORMAL HIGH (ref 4.0–10.5)
nRBC: 0 % (ref 0.0–0.2)

## 2020-04-17 LAB — URINALYSIS, ROUTINE W REFLEX MICROSCOPIC
Bilirubin Urine: NEGATIVE
Glucose, UA: NEGATIVE mg/dL
Ketones, ur: NEGATIVE mg/dL
Nitrite: POSITIVE — AB
Protein, ur: 30 mg/dL — AB
Specific Gravity, Urine: 1.008 (ref 1.005–1.030)
WBC, UA: >50 WBC/hpf — ABNORMAL HIGH (ref 0–5)
pH: 7 (ref 5.0–8.0)

## 2020-04-17 LAB — ETHANOL: Alcohol, Ethyl (B): <10 mg/dL (ref <10)

## 2020-04-17 LAB — PROTIME-INR
INR: 1.2 (ref 0.8–1.2)
Prothrombin Time: 14.4 s (ref 11.4–15.2)

## 2020-04-17 LAB — LACTIC ACID, PLASMA
Lactic Acid, Venous: 2.5 mmol/L (ref 0.5–1.9)
Lactic Acid, Venous: 2.2 mmol/L (ref 0.5–1.9)

## 2020-04-17 LAB — LIPASE, BLOOD: Lipase: 16 U/L (ref 11–51)

## 2020-04-17 LAB — APTT: aPTT: 44 s — ABNORMAL HIGH (ref 24–36)

## 2020-04-17 LAB — PREGNANCY, URINE: Preg Test, Ur: NEGATIVE

## 2020-04-17 LAB — AMMONIA: Ammonia: 30 umol/L (ref 9–35)

## 2020-04-17 MED ORDER — LACTATED RINGERS IV BOLUS (SEPSIS)
500.0000 mL | Freq: Once | INTRAVENOUS | Status: AC
  Administered 2020-04-17: 500 mL via INTRAVENOUS

## 2020-04-17 MED ORDER — LACTATED RINGERS IV SOLN: INTRAVENOUS | Status: DC

## 2020-04-17 MED ORDER — LORAZEPAM 2 MG/ML IJ SOLN
2.0000 mg | Freq: Once | INTRAMUSCULAR | Status: AC
  Administered 2020-04-17: 2 mg via INTRAVENOUS
  Filled 2020-04-17: qty 1

## 2020-04-17 MED ORDER — ONDANSETRON HCL 4 MG/2ML IJ SOLN
4.0000 mg | Freq: Once | INTRAMUSCULAR | Status: AC
  Administered 2020-04-17: 4 mg via INTRAVENOUS
  Filled 2020-04-17: qty 2

## 2020-04-17 MED ORDER — IOHEXOL 300 MG/ML  SOLN
100.0000 mL | Freq: Once | INTRAMUSCULAR | Status: AC | PRN
  Administered 2020-04-17: 100 mL via INTRAVENOUS

## 2020-04-17 MED ORDER — PIPERACILLIN-TAZOBACTAM 3.375 G IVPB 30 MIN
3.3750 g | Freq: Once | INTRAVENOUS | Status: AC
  Administered 2020-04-17: 3.375 g via INTRAVENOUS
  Filled 2020-04-17: qty 50

## 2020-04-17 MED ORDER — POTASSIUM CHLORIDE 10 MEQ/100ML IV SOLN
10.0000 meq | Freq: Once | INTRAVENOUS | Status: AC
  Administered 2020-04-17: 10 meq via INTRAVENOUS
  Filled 2020-04-17: qty 100

## 2020-04-17 MED ORDER — SODIUM CHLORIDE 0.9 % IV BOLUS
1000.0000 mL | Freq: Once | INTRAVENOUS | Status: AC
  Administered 2020-04-17: 1000 mL via INTRAVENOUS

## 2020-04-17 MED ORDER — ACETAMINOPHEN 500 MG PO TABS
1000.0000 mg | ORAL_TABLET | Freq: Once | ORAL | Status: AC
  Administered 2020-04-17: 1000 mg via ORAL
  Filled 2020-04-17: qty 2

## 2020-04-17 MED ORDER — MORPHINE SULFATE (PF) 2 MG/ML IV SOLN
2.0000 mg | Freq: Once | INTRAVENOUS | Status: AC
  Administered 2020-04-17: 2 mg via INTRAVENOUS
  Filled 2020-04-17: qty 1

## 2020-04-17 MED ORDER — SODIUM CHLORIDE 0.9 % IV SOLN
1.0000 g | Freq: Once | INTRAVENOUS | Status: AC
  Administered 2020-04-17: 1 g via INTRAVENOUS
  Filled 2020-04-17: qty 10

## 2020-04-17 NOTE — Progress Notes (Signed)
Pharmacy Antibiotic Note  Olivia Lester is a 44 y.o. female admitted on 04/17/2020 with intra-abdominal infection.  Pharmacy has been consulted for Zosyn dosing.  Plan: Start Zosyn 3.375g IV q8h (4-hr infusion) Pharmacy will continue to monitor renal function,  cultures and patient progress.   Height: 5\' 6"  (167.6 cm) Weight: 49.9 kg (110 lb 0.2 oz) IBW/kg (Calculated) : 59.3  Temp (24hrs), Avg:100.6 F (38.1 C), Min:100.6 F (38.1 C), Max:100.6 F (38.1 C)  Recent Labs  Lab 04/17/20 1400  WBC 16.2*    Estimated Creatinine Clearance: 71.4 mL/min (by C-G formula based on SCr of 0.61 mg/dL).    Allergies  Allergen Reactions  . Toradol [Ketorolac Tromethamine] Other (See Comments)    Patient advised not to take due to ulcers  . Asa [Aspirin] Rash    Antimicrobials this admission: Zosyn 9/24 >>    Microbiology results:  9/24 Resp PCR: SARS CoV-2 negative; Flu A/B negative     Thank you for allowing pharmacy to be a part of this patient's care.  Despina Pole 04/17/2020 3:27 PM

## 2020-04-17 NOTE — ED Notes (Signed)
Hx of alcohol related illness   Here due to stomach swelling fever, malaise

## 2020-04-17 NOTE — ED Notes (Addendum)
Pt with slurred speech   Odoriferious of ETOH beverage  Moved to rm 7 with removal of open bottles from her purse by nursing staff due to fear of spill  Report to [place], [place]

## 2020-04-17 NOTE — ED Notes (Signed)
Pt told she was NPO, pt took a cheeseburger out of her bag and ate it.

## 2020-04-17 NOTE — ED Notes (Addendum)
Pt to CT      Pt refuses to go until she speaks with PA

## 2020-04-17 NOTE — H&P (Signed)
TRH H&P    Patient Demographics:    Olivia Lester, is a 44 y.o. female  MRN: [id number]  DOB - 04-10-1976  Admit Date - 04/17/2020  Referring MD/NP/PA: Sabra Heck  Outpatient Primary MD for the patient is Patient, No Pcp Per  Patient coming from: Home  Chief complaint- Abdominal pain   HPI:    Olivia Lester  is a 44 y.o. female,with history of PUD, Hep C, Pancreatitis, polysub abuse, and more who presents to the ED for fever, abdominal pain and myalgias. While in the ER, patient was drinking from a bottle of liquor in her bag and became intoxicated. She was also given ativan for withdrawal prevention/ agitation. So, at the time of my exam, patient is incoherent and with slurred speech. She is irritable, and when she does wake up enough to produce clear speech she reports that she is not answering these questions. This history is gathered from chart review. Patient reports that 2 days ago she had the onset of n/v/d and cough. She reported that the cough is productive of sputum. She doesn't quantify how many episodes of non-bloody diarrhea, but reports that there have been many. She also reported fatigue and myalgias that have been worsening. This morning, she thought her abdomen may be distended, and she presented to the hospital for that reason. She thinks she may have been exposed to covid and also wanted a covid test, which was negative. She denied CP and dyspnea to ER provider.  Of note - patient does have a history of EtOH abuse with the last drink being in the ER. She reports that she is "cutting back" from "gallons" of liquor per day to just one fifth.   Patient also smokws 1 ppd.    IN the ED T 100.6, HR 111, RR 17-26, BP 123/82  WBC 16.2, lactic acid 2.5 and then 2.2 after 1 L bolus. Covid, flu a, and Flu B are negative LFTs elevated CT shows hepatic steatosis and pyelonephritis Rocephin and Zosyn started in ED CXR  = No focal airspace disease.  UA in indicative of UTI Blood cultures and Urine culture penidng.   Review of systems:    In addition to the HPI above,  Review of Systems  Unable to perform ROS: Mental status change    All other systems reviewed and are negative.    Past History of the following :    Past Medical History:  Diagnosis Date  . Alcohol abuse   . Back injury   . Gastric ulcer   . Gastric ulcer 10/14/2013  . Hepatitis C 10/14/2013  . History of kidney stones   . 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 Pancreatitis, acute   . Panic attack   . Pneumonia   . Polysubstance abuse ([place])   . PONV (postoperative nausea and vomiting)   . Seizures ([place])       Past Surgical History:  Procedure Laterality Date  . BACK SURGERY    . BREAST SURGERY    . ESOPHAGOGASTRODUODENOSCOPY (  EGD) WITH PROPOFOL N/A 10/14/2013   Procedure: ESOPHAGOGASTRODUODENOSCOPY (EGD) WITH PROPOFOL;  Surgeon: Rogene Houston, MD;  Location: [facility];  Service: Endoscopy;  Laterality: N/A;  . ORIF TOE FRACTURE Right 08/08/2018   Procedure: RIGHT FOOT INTERNAL FIXATION 5TH METATARSAL;  Surgeon: Newt Minion, MD;  Location: [facility];  Service: Orthopedics;  Laterality: Right;  . TONSILLECTOMY        Social History:      Social History   Tobacco Use  . Smoking status: Current Every Day Smoker    Packs/day: 1.00    Years: 20.00    Pack years: 20.00    Types: Cigarettes  . Smokeless tobacco: Never Used  Substance Use Topics  . Alcohol use: Yes    Alcohol/week: 18.0 standard drinks    Types: 18 Cans of beer per week    Comment: 1/5 -1/2 gallon of liqour daily or 18 beers daily       Family History :     Family History  Problem Relation Age of Onset  . Cancer Mother 8       ovarian   . Hypertension Mother   . Stroke Mother   . Early death Father   . Alcohol abuse Father       Home Medications:   Prior to Admission medications   Medication Sig Start Date End Date  Taking? Authorizing Provider  naproxen sodium (ALEVE) 220 MG tablet Take 440 mg by mouth daily as needed.   Yes [provider]  pantoprazole (PROTONIX) 40 MG tablet Take 1 tablet (40 mg total) by mouth daily. 10/29/19  Yes Emokpae, Courage, MD  amLODipine (NORVASC) 5 MG tablet Take 1 tablet (5 mg total) by mouth daily. Patient not taking: Reported on 01/26/2020 10/28/19   Roxan Hockey, MD  chlordiazePOXIDE (LIBRIUM) 25 MG capsule 25m PO TID x 1D, then 25-51mPO BID X 1D, then 25-5043mO QD X 1D Patient not taking: Reported on 03/30/2020 02/17/20   IdoEvalee JeffersonA-C  diphenhydrAMINE (BENADRYL) 25 mg capsule Take 25 mg by mouth daily as needed for allergies. Patient not taking: Reported on 01/26/2020    [provider]  folic acid (FOLVITE) 1 MG tablet Take 1 tablet (1 mg total) by mouth daily. Patient not taking: Reported on 01/07/2020 10/29/19   EmoRoxan HockeyD  lipase/protease/amylase 24000-76000 units CPEP Take 1 capsule (24,000 Units total) by mouth 3 (three) times daily before meals. Patient not taking: Reported on 01/07/2020 10/29/19   EmoRoxan HockeyD  Multiple Vitamin (MULTIVITAMIN WITH MINERALS) TABS tablet Take 1 tablet by mouth every other day.  Patient not taking: Reported on 01/07/2020    [provider]  ondansetron (ZOFRAN ODT) 4 MG disintegrating tablet Take 1 tablet (4 mg total) by mouth every 8 (eight) hours as needed for nausea or vomiting. Patient not taking: Reported on 03/30/2020 02/17/20   IdoEvalee JeffersonA-C  permethrin (ELIMITE) 5 % cream Apply from neck down, leaving on for 10 hours then shower.  Repeat in 10 days if symptoms persist Patient not taking: Reported on 03/30/2020 02/17/20   IdoEvalee JeffersonA-C  promethazine (PHENERGAN) 25 MG tablet Take 1 tablet (25 mg total) by mouth every 8 (eight) hours as needed for nausea or vomiting. 03/30/20   KohVirgel ManifoldD  thiamine 100 MG tablet Take 1 tablet (100 mg total) by mouth daily. Patient not taking:  Reported on 01/07/2020 10/29/19   EmoRoxan HockeyD     Allergies:  Allergies  Allergen Reactions  . Toradol [Ketorolac Tromethamine] Other (See Comments)    Patient advised not to take due to ulcers  . Asa [Aspirin] Rash     Physical Exam:   Vitals  Blood pressure 123/82, pulse (!) 106, temperature (!) 100.6 F (38.1 C), resp. rate 17, height 5' 6"  (1.676 m), weight 49.9 kg, SpO2 93 %.  1.  General: Lying supine in bed, asleep  2. Psychiatric: Answer questions inappropriately Irritable Oriented to self  3. Neurologic: GCS = 13 Moves all 4 extremities voluntarily Occasional tremors in the upper extremities, no asterixis  4. HEENMT:  Poor dentition Mucous membranes dry Pupils reactive Neck supple Trachea midline  5. Respiratory : Wheeze in BL lung fields No accessory muscle use No crackles or rhonchi  6. Cardiovascular : HR tachycardic Systolic murmur Rhythm regular Peripheral pulses palpated  7. Gastrointestinal:  Abdomen is soft, non distended, and tender to palpation of the Lower quadrants BL, and the right upper quadrant  8. Skin:  Bruising on L forearm  No other acute lesions on limited skin eam  9.Musculoskeletal:  No acute deformity No peripheral edema    Data Review:    CBC Recent Labs  Lab 04/17/20 1400  WBC 16.2*  HGB 12.5  HCT 37.4  PLT 191  MCV 98.4  MCH 32.9  MCHC 33.4  RDW 15.9*  LYMPHSABS 1.3  MONOABS 0.8  EOSABS 0.0  BASOSABS 0.1   ------------------------------------------------------------------------------------------------------------------  Results for orders placed or performed during the hospital encounter of 04/17/20 (from the past 48 hour(s))  Resp Panel by RT PCR (RSV, Flu A&B, Covid) - Nasopharyngeal Swab     Status: None   Collection Time: 04/17/20  1:15 PM   Specimen: Nasopharyngeal Swab  Result Value Ref Range   SARS Coronavirus 2 by RT PCR NEGATIVE NEGATIVE    Comment: (NOTE) SARS-CoV-2 target  nucleic acids are NOT DETECTED.  The SARS-CoV-2 RNA is generally detectable in upper respiratoy specimens during the acute phase of infection. The lowest concentration of SARS-CoV-2 viral copies this assay can detect is 131 copies/mL. A negative result does not preclude SARS-Cov-2 infection and should not be used as the sole basis for treatment or other patient management decisions. A negative result may occur with  improper specimen collection/handling, submission of specimen other than nasopharyngeal swab, presence of viral mutation(s) within the areas targeted by this assay, and inadequate number of viral copies (<131 copies/mL). A negative result must be combined with clinical observations, patient history, and epidemiological information. The expected result is Negative.  Fact Sheet for Patients:  PinkCheek.be  Fact Sheet for Healthcare Providers:  GravelBags.it  This test is no t yet approved or cleared by the [place] FDA and  has been authorized for detection and/or diagnosis of SARS-CoV-2 by FDA under an Emergency Use Authorization (EUA). This EUA will remain  in effect (meaning this test can be used) for the duration of the COVID-19 declaration under Section 564(b)(1) of the Act, 21 U.S.C. section 360bbb-3(b)(1), unless the authorization is terminated or revoked sooner.     Influenza A by PCR NEGATIVE NEGATIVE   Influenza B by PCR NEGATIVE NEGATIVE    Comment: (NOTE) The Xpert Xpress SARS-CoV-2/FLU/RSV assay is intended as an aid in  the diagnosis of influenza from Nasopharyngeal swab specimens and  should not be used as a sole basis for treatment. Nasal washings and  aspirates are unacceptable for Xpert Xpress SARS-CoV-2/FLU/RSV  testing.  Fact Sheet for Patients: PinkCheek.be  Fact Sheet for Healthcare Providers: GravelBags.it  This test is  not yet approved or cleared by the [place] FDA and  has been authorized for detection and/or diagnosis of SARS-CoV-2 by  FDA under an Emergency Use Authorization (EUA). This EUA will remain  in effect (meaning this test can be used) for the duration of the  Covid-19 declaration under Section 564(b)(1) of the Act, 21  U.S.C. section 360bbb-3(b)(1), unless the authorization is  terminated or revoked.    Respiratory Syncytial Virus by PCR NEGATIVE NEGATIVE    Comment: (NOTE) Fact Sheet for Patients: PinkCheek.be  Fact Sheet for Healthcare Providers: GravelBags.it  This test is not yet approved or cleared by the [place] FDA and  has been authorized for detection and/or diagnosis of SARS-CoV-2 by  FDA under an Emergency Use Authorization (EUA). This EUA will remain  in effect (meaning this test can be used) for the duration of the  COVID-19 declaration under Section 564(b)(1) of the Act, 21 U.S.C.  section 360bbb-3(b)(1), unless the authorization is terminated or  revoked. Performed at [facility], [address] [place], [place] [address]   CBC with Differential     Status: Abnormal   Collection Time: 04/17/20  2:00 PM  Result Value Ref Range   WBC 16.2 (H) 4.0 - 10.5 K/uL   RBC 3.80 (L) 3.87 - 5.11 MIL/uL   Hemoglobin 12.5 12.0 - 15.0 g/dL   HCT 37.4 36 - 46 %   MCV 98.4 80.0 - 100.0 fL   MCH 32.9 26.0 - 34.0 pg   MCHC 33.4 30.0 - 36.0 g/dL   RDW 15.9 (H) 11.5 - 15.5 %   Platelets 191 150 - 400 K/uL   nRBC 0.0 0.0 - 0.2 %   Neutrophils Relative % 83 %   Neutro Abs 13.5 (H) 1.7 - 7.7 K/uL   Lymphocytes Relative 8 %   Lymphs Abs 1.3 0.7 - 4.0 K/uL   Monocytes Relative 5 %   Monocytes Absolute 0.8 0 - 1 K/uL   Eosinophils Relative 0 %   Eosinophils Absolute 0.0 0 - 0 K/uL   Basophils Relative 1 %   Basophils Absolute 0.1 0 - 0 K/uL   Immature Granulocytes 3 %   Abs Immature Granulocytes 0.51 (H) 0.00 -  0.07 K/uL    Comment: Performed at [facility], [address], [place], [place] [address]  Urinalysis, Routine w reflex microscopic Urine, Clean Catch     Status: Abnormal   Collection Time: 04/17/20  2:00 PM  Result Value Ref Range   Color, Urine YELLOW YELLOW   APPearance HAZY (A) CLEAR   Specific Gravity, Urine 1.008 1.005 - 1.030   pH 7.0 5.0 - 8.0   Glucose, UA NEGATIVE NEGATIVE mg/dL   Hgb urine dipstick MODERATE (A) NEGATIVE   Bilirubin Urine NEGATIVE NEGATIVE   Ketones, ur NEGATIVE NEGATIVE mg/dL   Protein, ur 30 (A) NEGATIVE mg/dL   Nitrite POSITIVE (A) NEGATIVE   Leukocytes,Ua LARGE (A) NEGATIVE   RBC / HPF 11-20 0 - 5 RBC/hpf   WBC, UA >50 (H) 0 - 5 WBC/hpf   Bacteria, UA MANY (A) NONE SEEN   Squamous Epithelial / LPF 0-5 0 - 5   Budding Yeast PRESENT     Comment: Performed at [facility], [address] [place], [place] [address]  Pregnancy, urine     Status: None   Collection Time: 04/17/20  2:00 PM  Result Value Ref Range   Preg Test, Ur  NEGATIVE NEGATIVE    Comment:        THE SENSITIVITY OF THIS METHODOLOGY IS >20 mIU/mL. Performed at [facility], [address] [place], [place] [address]   Lactic acid, plasma     Status: Abnormal   Collection Time: 04/17/20  2:53 PM  Result Value Ref Range   Lactic Acid, Venous 2.5 (HH) 0.5 - 1.9 mmol/L    Comment: CRITICAL RESULT CALLED TO, READ BACK BY AND VERIFIED WITH: TUTTLE,A ON 04/17/20 AT 1555 BY LOY,C Performed at [facility], [address], [place], [place] [address]   Ammonia     Status: None   Collection Time: 04/17/20  2:53 PM  Result Value Ref Range   Ammonia 30 9 - 35 umol/L    Comment: Performed at [facility], [address], [place], [place] [address]  Comprehensive metabolic panel     Status: Abnormal   Collection Time: 04/17/20  2:53 PM  Result Value Ref Range   Sodium 132 (L) 135 - 145 mmol/L   Potassium 2.9 (L) 3.5 - 5.1 mmol/L   Chloride 90 (L) 98 - 111 mmol/L   CO2 26 22 - 32 mmol/L    Glucose, Bld 77 70 - 99 mg/dL    Comment: Glucose reference range applies only to samples taken after fasting for at least 8 hours.   BUN 5 (L) 6 - 20 mg/dL   Creatinine, Ser 0.46 0.44 - 1.00 mg/dL   Calcium 7.6 (L) 8.9 - 10.3 mg/dL   Total Protein 5.9 (L) 6.5 - 8.1 g/dL   Albumin 2.5 (L) 3.5 - 5.0 g/dL   AST 58 (H) 15 - 41 U/L   ALT 55 (H) 0 - 44 U/L   Alkaline Phosphatase 148 (H) 38 - 126 U/L   Total Bilirubin 1.3 (H) 0.3 - 1.2 mg/dL   GFR calc non Af Amer >60 >60 mL/min   GFR calc Af Amer >60 >60 mL/min   Anion gap 16 (H) 5 - 15    Comment: Performed at [facility], [address] [place], [place] [address]  Lipase, blood     Status: None   Collection Time: 04/17/20  2:53 PM  Result Value Ref Range   Lipase 16 11 - 51 U/L    Comment: Performed at [facility], [address], [place], [place] [address]  Lactic acid, plasma     Status: Abnormal   Collection Time: 04/17/20  4:11 PM  Result Value Ref Range   Lactic Acid, Venous 2.2 (HH) 0.5 - 1.9 mmol/L    Comment: CRITICAL VALUE NOTED.  VALUE IS CONSISTENT WITH PREVIOUSLY REPORTED AND CALLED VALUE. Performed at [facility], [address], [place],  [address]     Chemistries  Recent Labs  Lab 04/17/20 1453  NA 132*  K 2.9*  CL 90*  CO2 26  GLUCOSE 77  BUN 5*  CREATININE 0.46  CALCIUM 7.6*  AST 58*  ALT 55*  ALKPHOS 148*  BILITOT 1.3*   ------------------------------------------------------------------------------------------------------------------  ------------------------------------------------------------------------------------------------------------------ GFR: Estimated Creatinine Clearance: 71.4 mL/min (by C-G formula based on SCr of 0.46 mg/dL). Liver Function Tests: Recent Labs  Lab 04/17/20 1453  AST 58*  ALT 55*  ALKPHOS 148*  BILITOT 1.3*  PROT 5.9*  ALBUMIN 2.5*   Recent Labs  Lab 04/17/20 1453  LIPASE 16   Recent Labs  Lab 04/17/20 1453  AMMONIA 30   Coagulation  Profile: No results for input(s): INR, PROTIME in the last 168 hours. Cardiac Enzymes:  No results for input(s): CKTOTAL, CKMB, CKMBINDEX, TROPONINI in the last 168 hours. BNP (last 3 results) No results for input(s): PROBNP in the last 8760 hours. HbA1C: No results for input(s): HGBA1C in the last 72 hours. CBG: No results for input(s): GLUCAP in the last 168 hours. Lipid Profile: No results for input(s): CHOL, HDL, LDLCALC, TRIG, CHOLHDL, LDLDIRECT in the last 72 hours. Thyroid Function Tests: No results for input(s): TSH, T4TOTAL, FREET4, T3FREE, THYROIDAB in the last 72 hours. Anemia Panel: No results for input(s): VITAMINB12, FOLATE, FERRITIN, TIBC, IRON, RETICCTPCT in the last 72 hours.  --------------------------------------------------------------------------------------------------------------- Urine analysis:    Component Value Date/Time   COLORURINE YELLOW 04/17/2020 1400   APPEARANCEUR HAZY (A) 04/17/2020 1400   APPEARANCEUR Clear 12/07/2013 0133   LABSPEC 1.008 04/17/2020 1400   LABSPEC 1.006 12/07/2013 0133   PHURINE 7.0 04/17/2020 1400   GLUCOSEU NEGATIVE 04/17/2020 1400   GLUCOSEU Negative 12/07/2013 0133   HGBUR MODERATE (A) 04/17/2020 1400   BILIRUBINUR NEGATIVE 04/17/2020 1400   BILIRUBINUR Negative 12/07/2013 0133   KETONESUR NEGATIVE 04/17/2020 1400   PROTEINUR 30 (A) 04/17/2020 1400   UROBILINOGEN 0.2 04/28/2015 1559   NITRITE POSITIVE (A) 04/17/2020 1400   LEUKOCYTESUR LARGE (A) 04/17/2020 1400   LEUKOCYTESUR Negative 12/07/2013 0133      Imaging Results:    CT ABDOMEN PELVIS W CONTRAST  Result Date: 04/17/2020 CLINICAL DATA:  Acute nonlocalized abdominal pain. Fever and malaise. Abdominal swelling. EXAM: CT ABDOMEN AND PELVIS WITH CONTRAST TECHNIQUE: Multidetector CT imaging of the abdomen and pelvis was performed using the standard protocol following bolus administration of intravenous contrast. CONTRAST:  146m OMNIPAQUE IOHEXOL 300 MG/ML  SOLN  COMPARISON:  Most recent CT 01/26/2020 FINDINGS: Lower chest: No pleural fluid or focal airspace disease. The heart is normal in size. Hepatobiliary: Again seen advanced hepatic steatosis. Mildly enlarged liver spanning 21 cm cranial caudal. No evidence of focal hepatic lesion. Patent portal vein. Pancreas: Ill-defined fat planes adjacent to the pancreatic head. No acute peripancreatic collection. No ductal dilatation. Spleen: Normal in size without focal abnormality. Adrenals/Urinary Tract: No adrenal nodule. Diffusely heterogeneous left renal enhancement with striated nephrogram in areas of diminished perfusion involving the upper, mid lower kidney. There is left perinephric edema. No evidence of perirenal fluid collection. No renal or ureteral calculi. Slight heterogeneous enhancement of the upper right kidney. Minimal adjacent perinephric edema. The urinary bladder is physiologically distended without wall thickening. Stomach/Bowel: Bowel evaluation is limited in the absence of enteric contrast as well as mild motion artifact. Decompressed stomach. Normal positioning of the duodenum and ligament of Treitz. There is no small bowel obstruction or inflammatory change. Normal appendix. Small volume of colonic stool. There is no colonic wall thickening or inflammation. Vascular/Lymphatic: Moderate aorto bi-iliac atherosclerosis, advanced for age. No aortic aneurysm. The portal vein is patent. There multiple prominent retroperitoneal lymph nodes are borderline enlarged. Prominent central mesenteric nodes. The renal arteries appear patent. Reproductive: Free fluid adjacent to the uterus which is otherwise unremarkable. 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 Other: Small amount of free fluid in the pelvis. There is no upper abdominal ascites. No abdominal wall hernia. There is generalized body wall edema. Musculoskeletal: Posterior lumbar fusion with intact hardware. There are no acute or suspicious osseous abnormalities.  IMPRESSION: 1. Heterogeneous left renal enhancement with striated nephrogram and areas of diminished perfusion involving the upper, mid lower kidney, consistent with pyelonephritis. No perirenal fluid collection. Findings also suspicious for pyelonephritis of the upper pole of the right kidney. 2.  Ill-defined fat planes adjacent to the pancreatic head, may represent acute pancreatitis. Recommend correlation with pancreatic enzymes. 3. Advanced hepatic steatosis and mild hepatomegaly. 4. 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 5. Small amount of free fluid in the pelvis. Generalized body wall edema. 6. Aortic atherosclerosis is advanced for age. Aortic Atherosclerosis (ICD10-I70.0). Electronically Signed   By: Keith Rake M.D.   On: 04/17/2020 19:03   DG Chest Portable 1 View  Result Date: 04/17/2020 CLINICAL DATA:  Cough EXAM: PORTABLE CHEST 1 VIEW COMPARISON:  09/04/2019 chest radiograph and prior. FINDINGS: The heart size and mediastinal contours are within normal limits. Both lungs are clear. No pneumothorax or pleural effusion. No acute osseous abnormality. IMPRESSION: No focal airspace disease. Electronically Signed   By: Primitivo Gauze M.D.   On: 04/17/2020 14:52    My personal review of EKG: Rhythm ST, Rate 106 /min, QTc 480, T wave inversions in lateral leads   Assessment & Plan:    Active Problems:   Sepsis ([place])   1. Sepsis 2/2 Pyelonephritis 1. T 100.6, HR 111, RR 26, WBC 16.2, Blood pressure stable 2. Lactic acid 2.5 and 2.2 3. Blood cultures pending 4. Urine culture pending 5. Pyelonephritis seen on CT + UA is indicative of UTI 6. Zosyn and Rocephin given in the ED, continue Rocephin 7. 1 L bolus in ED, continue NS at 15m/hour 8. Continue to monitor 2. Pyelonephritis 1. Seen on CT 2. Continue Rocephin 3. Metabolic encephalopathy 1. Answering questions inappropriately, can't stay awake 2. Likely 2/2 EtOH and ativan 3. Sepsis and electrolyte imbalances could also be  contributing.  4. EtOH level pending 5. Start thiamine and folic acid supplementation 6. UDS pending, continue to monitor 4. Hypokalemia 1. Replace and recheck 5. Hyponatremia 1. Hypovolemic hyponatremia at 132 2. Continue fluids 3. Trend in the AM 6. EtOH Abuse 1. Last drink was in the ER 2. Patient reports that she has "cut back" from gallons of liquor per day to one fifth 3. Will start CIWA protocol 4. Admitting to stepdown due to the high likelihood that patient will require precedex 7. Protein cal malnutrition 1. Albumin 2.5 2. Encourage nutrient dense food choices 3. Liver disease also likely contributing to hypoalbunemia  8. Hepatic steatosis 1. T bili 1.3 2. Alk phos 148 3. AST 58 4. ALT 55 5. Hepatic steatosis seen on CT   DVT Prophylaxis-   Heparin - SCDs   AM Labs Ordered, also please review Full Orders  Family Communication: No family at bedside Code Status:  Full  Admission status: Inpatient :The appropriate admission status for this patient is INPATIENT. Inpatient status is judged to be reasonable and necessary in order to provide the required intensity of service to ensure the patient's safety. The patient's presenting symptoms, physical exam findings, and initial radiographic and laboratory data in the context of their chronic comorbidities is felt to place them at high risk for further clinical deterioration. Furthermore, it is not anticipated that the patient will be medically stable for discharge from the hospital within 2 midnights of admission. The following factors support the admission status of inpatient.     The patient's presenting symptoms include abdominal pain, body aches The worrisome physical exam findings include tenderness to abdomen, SIRS criteria The initial radiographic and laboratory data are worrisome because of CT shows signs of pyelonephritis The chronic co-morbidities include EtOH abuse       * I certify that at the point of  admission it is my clinical judgment  that the patient will require inpatient hospital care spanning beyond 2 midnights from the point of admission due to high intensity of service, high risk for further deterioration and high frequency of surveillance required.*  Time spent in minutes : [place]

## 2020-04-17 NOTE — ED Notes (Signed)
covd spec to lab

## 2020-04-17 NOTE — ED Provider Notes (Signed)
[facility] EMERGENCY DEPARTMENT Provider Note   CSN: [id number] Arrival date & time: 04/17/20  1212     History Chief Complaint  Patient presents with   Covid Exposure   Generalized Body Aches    Olivia Lester is a 44 y.o. female with PMH/o gastric ulcer, hepatitis C, pancreatitis, polysubstance abuse, who presents for evaluation of fevers, abdominal pain and swelling, generalized body aches, nausea/vomiting/diarrhea and cough that has been ongoing for 2 days. She states cough is productive of phlegm. She has not been able to tolerate any PO for the last 2 days. She also reports that she has had multiple episodes of diarrhea. She does not think there has been any blood in the stool. She reports feeling achey and fatigued. She has had some generalized abdominal pain that worsened this AM. She also felt like her abdomen was getting swollen this morning. She does report exposure to COVID. She has not been vaccinated. She does report that she has been drinking alcohol and her last intake was 4 shots of liquor this morning. She states she usually does 6-7 shots of liquor/day. She denies any CP, SOB.    The history is provided by the patient.       Past Medical History:  Diagnosis Date   Alcohol abuse    Back injury    Gastric ulcer    Gastric ulcer 10/14/2013   Hepatitis C 10/14/2013   History of kidney stones    Nonunion of foot fracture    right 5th metatarsal   Pancreatitis, acute    Panic attack    Pneumonia    Polysubstance abuse ([place])    PONV (postoperative nausea and vomiting)    Seizures ([place])     Patient Active Problem List   Diagnosis Date Noted   Sepsis ([place]) 04/17/2020   Fatty liver, alcoholic/Severe hepatic steatosis 01/29/2020   Pancreatitis, recurrent 01/26/2020   Colitis 10/28/2019   Polysubstance abuse ([place]) 10/28/2019   Hypomagnesemia 88/89/1694   Alcoholic hepatitis without ascites--Severe hepatic steatosis 50/38/8828   Alcoholic  gastritis 00/34/9179   DTs (delirium tremens) ([place])    Urinary tract infection without hematuria    Alcohol intoxication ([place]) 09/04/2019   Alcohol withdrawal ([place]) 06/24/2019   Sinus tachycardia 06/24/2019   Essential hypertension 06/24/2019   Hypokalemia 03/31/2019   Cannabis abuse 03/11/2019   Elevated transaminase level    Alcohol abuse    Closed displaced fracture of fifth metatarsal bone of right foot 08/02/2018   Dental abscess 12/25/2017   Gastric ulcer 10/14/2013   Transaminitis 10/14/2013   Tobacco dependence 10/14/2013   Hepatitis C 10/14/2013   Thrombocytopenia ([place]) 10/13/2013   Duodenitis 10/12/2013   Alcoholism ([place]) 10/12/2013   Abdominal pain, epigastric 10/12/2013   ANKLE PAIN 01/01/2008    Past Surgical History:  Procedure Laterality Date   BACK SURGERY     BREAST SURGERY     ESOPHAGOGASTRODUODENOSCOPY (EGD) WITH PROPOFOL N/A 10/14/2013   Procedure: ESOPHAGOGASTRODUODENOSCOPY (EGD) WITH PROPOFOL;  Surgeon: Rogene Houston, MD;  Location: [facility];  Service: Endoscopy;  Laterality: N/A;   ORIF TOE FRACTURE Right 08/08/2018   Procedure: RIGHT FOOT INTERNAL FIXATION 5TH METATARSAL;  Surgeon: Newt Minion, MD;  Location: [facility];  Service: Orthopedics;  Laterality: Right;   TONSILLECTOMY       OB History    Gravida  7   Para  3   Term  3   Preterm      AB  4   Living  3     SAB  2   TAB  2   Ectopic      Multiple      Live Births  3           Family History  Problem Relation Age of Onset   Cancer Mother 12       ovarian    Hypertension Mother    Stroke Mother    Early death Father    Alcohol abuse Father     Social History   Tobacco Use   Smoking status: Current Every Day Smoker    Packs/day: 1.00    Years: 20.00    Pack years: 20.00    Types: Cigarettes   Smokeless tobacco: Never Used  Vaping Use   Vaping Use: Never used  Substance Use Topics   Alcohol use: Yes    Alcohol/week: 18.0  standard drinks    Types: 18 Cans of beer per week    Comment: 1/5 -1/2 gallon of liqour daily or 18 beers daily   Drug use: Yes    Types: Marijuana, Cocaine, Methamphetamines    Comment: denies current cocaine/meth...current mj    Home Medications Prior to Admission medications   Medication Sig Start Date End Date Taking? Authorizing Provider  naproxen sodium (ALEVE) 220 MG tablet Take 440 mg by mouth daily as needed.   Yes [provider]  pantoprazole (PROTONIX) 40 MG tablet Take 1 tablet (40 mg total) by mouth daily. 10/29/19  Yes Emokpae, Courage, MD  amLODipine (NORVASC) 5 MG tablet Take 1 tablet (5 mg total) by mouth daily. Patient not taking: Reported on 01/26/2020 10/28/19   Roxan Hockey, MD  chlordiazePOXIDE (LIBRIUM) 25 MG capsule 63m PO TID x 1D, then 25-547mPO BID X 1D, then 25-5030mO QD X 1D Patient not taking: Reported on 03/30/2020 02/17/20   IdoEvalee JeffersonA-C  diphenhydrAMINE (BENADRYL) 25 mg capsule Take 25 mg by mouth daily as needed for allergies. Patient not taking: Reported on 01/26/2020    [provider]  folic acid (FOLVITE) 1 MG tablet Take 1 tablet (1 mg total) by mouth daily. Patient not taking: Reported on 01/07/2020 10/29/19   EmoRoxan HockeyD  lipase/protease/amylase 24000-76000 units CPEP Take 1 capsule (24,000 Units total) by mouth 3 (three) times daily before meals. Patient not taking: Reported on 01/07/2020 10/29/19   EmoRoxan HockeyD  Multiple Vitamin (MULTIVITAMIN WITH MINERALS) TABS tablet Take 1 tablet by mouth every other day.  Patient not taking: Reported on 01/07/2020    [provider]  ondansetron (ZOFRAN ODT) 4 MG disintegrating tablet Take 1 tablet (4 mg total) by mouth every 8 (eight) hours as needed for nausea or vomiting. Patient not taking: Reported on 03/30/2020 02/17/20   IdoEvalee JeffersonA-C  permethrin (ELIMITE) 5 % cream Apply from neck down, leaving on for 10 hours then shower.  Repeat in 10 days if symptoms  persist Patient not taking: Reported on 03/30/2020 02/17/20   IdoEvalee JeffersonA-C  promethazine (PHENERGAN) 25 MG tablet Take 1 tablet (25 mg total) by mouth every 8 (eight) hours as needed for nausea or vomiting. 03/30/20   KohVirgel ManifoldD  thiamine 100 MG tablet Take 1 tablet (100 mg total) by mouth daily. Patient not taking: Reported on 01/07/2020 10/29/19   EmoRoxan HockeyD    Allergies    Toradol [ketorolac tromethamine] and Asa [aspirin]  Review of Systems   Review of Systems  Constitutional: Positive for appetite change, fatigue and  fever.  Respiratory: Positive for cough. Negative for shortness of breath.   Cardiovascular: Negative for chest pain.  Gastrointestinal: Positive for abdominal pain, diarrhea, nausea and vomiting.  Genitourinary: Negative for dysuria and hematuria.  Musculoskeletal: Positive for myalgias.  Neurological: Negative for headaches.  All other systems reviewed and are negative.   Physical Exam Updated Vital Signs BP 123/82    Pulse (!) 106    Temp (!) 100.6 F (38.1 C)    Resp 17    Ht 5' 6" (1.676 m)    Wt 49.9 kg    SpO2 93%    BMI 17.76 kg/m   Physical Exam Vitals and nursing note reviewed.  Constitutional:      Appearance: Normal appearance. She is well-developed.     Comments: Appears uncomfortable  HENT:     Head: Normocephalic and atraumatic.  Eyes:     General: Lids are normal.     Conjunctiva/sclera: Conjunctivae normal.     Pupils: Pupils are equal, round, and reactive to light.  Cardiovascular:     Rate and Rhythm: Normal rate and regular rhythm.     Pulses: Normal pulses.     Heart sounds: Normal heart sounds. No murmur heard.  No friction rub. No gallop.   Pulmonary:     Effort: Pulmonary effort is normal.     Breath sounds: Normal breath sounds.     Comments: Lungs clear to auscultation bilaterally.  Symmetric chest rise.  No wheezing, rales, rhonchi. Abdominal:     General: There is distension.     Palpations: Abdomen is not  rigid.     Tenderness: There is generalized abdominal tenderness. There is guarding.     Comments: Diffuse tenderness noted. Some slight abdominal distention but no rigidity. Voluntary guarding noted.   Musculoskeletal:        General: Normal range of motion.     Cervical back: Full passive range of motion without pain.  Skin:    General: Skin is warm and dry.     Capillary Refill: Capillary refill takes less than 2 seconds.  Neurological:     Mental Status: She is alert and oriented to person, place, and time.  Psychiatric:        Speech: Speech normal.     ED Results / Procedures / Treatments   Labs (all labs ordered are listed, but only abnormal results are displayed) Labs Reviewed  CBC WITH DIFFERENTIAL/PLATELET - Abnormal; Notable for the following components:      Result Value   WBC 16.2 (*)    RBC 3.80 (*)    RDW 15.9 (*)    Neutro Abs 13.5 (*)    Abs Immature Granulocytes 0.51 (*)    All other components within normal limits  URINALYSIS, ROUTINE W REFLEX MICROSCOPIC - Abnormal; Notable for the following components:   APPearance HAZY (*)    Hgb urine dipstick MODERATE (*)    Protein, ur 30 (*)    Nitrite POSITIVE (*)    Leukocytes,Ua LARGE (*)    WBC, UA >50 (*)    Bacteria, UA MANY (*)    All other components within normal limits  LACTIC ACID, PLASMA - Abnormal; Notable for the following components:   Lactic Acid, Venous 2.5 (*)    All other components within normal limits  LACTIC ACID, PLASMA - Abnormal; Notable for the following components:   Lactic Acid, Venous 2.2 (*)    All other components within normal limits  COMPREHENSIVE METABOLIC PANEL - Abnormal;  Notable for the following components:   Sodium 132 (*)    Potassium 2.9 (*)    Chloride 90 (*)    BUN 5 (*)    Calcium 7.6 (*)    Total Protein 5.9 (*)    Albumin 2.5 (*)    AST 58 (*)    ALT 55 (*)    Alkaline Phosphatase 148 (*)    Total Bilirubin 1.3 (*)    Anion gap 16 (*)    All other  components within normal limits  APTT - Abnormal; Notable for the following components:   aPTT 44 (*)    All other components within normal limits  RESP PANEL BY RT PCR (RSV, FLU A&B, COVID)  CULTURE, BLOOD (ROUTINE X 2)  CULTURE, BLOOD (ROUTINE X 2)  URINE CULTURE  AMMONIA  LIPASE, BLOOD  PREGNANCY, URINE  PROTIME-INR  RAPID URINE DRUG SCREEN, HOSP PERFORMED  ETHANOL  POC URINE PREG, ED  I-STAT BETA HCG BLOOD, ED ([facility], [facility], [facility] ONLY)    EKG EKG Interpretation  Date/Time:  Friday April 17 2020 16:56:20 EDT Ventricular Rate:  106 PR Interval:    QRS Duration: 84 QT Interval:  361 QTC Calculation: 480 R Axis:   88 Text Interpretation: Sinus tachycardia Minimal ST depression, inferior leads Baseline wander in lead(s) V5 Confirmed by Fredia Sorrow [phone]) on 04/17/2020 5:04:05 PM   Radiology CT ABDOMEN PELVIS W CONTRAST  Result Date: 04/17/2020 CLINICAL DATA:  Acute nonlocalized abdominal pain. Fever and malaise. Abdominal swelling. EXAM: CT ABDOMEN AND PELVIS WITH CONTRAST TECHNIQUE: Multidetector CT imaging of the abdomen and pelvis was performed using the standard protocol following bolus administration of intravenous contrast. CONTRAST:  144m OMNIPAQUE IOHEXOL 300 MG/ML  SOLN COMPARISON:  Most recent CT 01/26/2020 FINDINGS: Lower chest: No pleural fluid or focal airspace disease. The heart is normal in size. Hepatobiliary: Again seen advanced hepatic steatosis. Mildly enlarged liver spanning 21 cm cranial caudal. No evidence of focal hepatic lesion. Patent portal vein. Pancreas: Ill-defined fat planes adjacent to the pancreatic head. No acute peripancreatic collection. No ductal dilatation. Spleen: Normal in size without focal abnormality. Adrenals/Urinary Tract: No adrenal nodule. Diffusely heterogeneous left renal enhancement with striated nephrogram in areas of diminished perfusion involving the upper, mid lower kidney. There is left perinephric edema. No evidence of  perirenal fluid collection. No renal or ureteral calculi. Slight heterogeneous enhancement of the upper right kidney. Minimal adjacent perinephric edema. The urinary bladder is physiologically distended without wall thickening. Stomach/Bowel: Bowel evaluation is limited in the absence of enteric contrast as well as mild motion artifact. Decompressed stomach. Normal positioning of the duodenum and ligament of Treitz. There is no small bowel obstruction or inflammatory change. Normal appendix. Small volume of colonic stool. There is no colonic wall thickening or inflammation. Vascular/Lymphatic: Moderate aorto bi-iliac atherosclerosis, advanced for age. No aortic aneurysm. The portal vein is patent. There multiple prominent retroperitoneal lymph nodes are borderline enlarged. Prominent central mesenteric nodes. The renal arteries appear patent. Reproductive: Free fluid adjacent to the uterus which is otherwise unremarkable. 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 Other: Small amount of free fluid in the pelvis. There is no upper abdominal ascites. No abdominal wall hernia. There is generalized body wall edema. Musculoskeletal: Posterior lumbar fusion with intact hardware. There are no acute or suspicious osseous abnormalities. IMPRESSION: 1. Heterogeneous left renal enhancement with striated nephrogram and areas of diminished perfusion involving the upper, mid lower kidney, consistent with pyelonephritis. No perirenal fluid collection. Findings also suspicious for pyelonephritis  of the upper pole of the right kidney. 2. Ill-defined fat planes adjacent to the pancreatic head, may represent acute pancreatitis. Recommend correlation with pancreatic enzymes. 3. Advanced hepatic steatosis and mild hepatomegaly. 4. 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 5. Small amount of free fluid in the pelvis. Generalized body wall edema. 6. Aortic atherosclerosis is advanced for age. Aortic Atherosclerosis (ICD10-I70.0). Electronically  Signed   By: Keith Rake M.D.   On: 04/17/2020 19:03   DG Chest Portable 1 View  Result Date: 04/17/2020 CLINICAL DATA:  Cough EXAM: PORTABLE CHEST 1 VIEW COMPARISON:  09/04/2019 chest radiograph and prior. FINDINGS: The heart size and mediastinal contours are within normal limits. Both lungs are clear. No pneumothorax or pleural effusion. No acute osseous abnormality. IMPRESSION: No focal airspace disease. Electronically Signed   By: Primitivo Gauze M.D.   On: 04/17/2020 14:52    Procedures .Critical Care Performed by: Volanda Napoleon, PA-C Authorized by: Volanda Napoleon, PA-C   Critical care provider statement:    Critical care time (minutes):  45   Critical care was necessary to treat or prevent imminent or life-threatening deterioration of the following conditions:  Sepsis   Critical care was time spent personally by me on the following activities:  Discussions with consultants, evaluation of patient's response to treatment, examination of patient, ordering and performing treatments and interventions, ordering and review of laboratory studies, ordering and review of radiographic studies, pulse oximetry, re-evaluation of patient's condition, obtaining history from patient or surrogate and review of old charts   (including critical care time)  Medications Ordered in ED Medications  potassium chloride 10 mEq in 100 mL IVPB (10 mEq Intravenous New Bag/Given 04/17/20 2006)  lactated ringers infusion ( Intravenous Restarted 04/17/20 2006)  sodium chloride 0.9 % bolus 1,000 mL (1,000 mLs Intravenous New Bag/Given 04/17/20 1420)  ondansetron (ZOFRAN) injection 4 mg (4 mg Intravenous Given 04/17/20 1420)  morphine 2 MG/ML injection 2 mg (2 mg Intravenous Given 04/17/20 1426)  piperacillin-tazobactam (ZOSYN) IVPB 3.375 g (0 g Intravenous Stopped 04/17/20 1555)  LORazepam (ATIVAN) injection 2 mg (2 mg Intravenous Given 04/17/20 1519)  acetaminophen (TYLENOL) tablet 1,000 mg (1,000 mg Oral  Given 04/17/20 1750)  iohexol (OMNIPAQUE) 300 MG/ML solution 100 mL (100 mLs Intravenous Contrast Given 04/17/20 1819)  lactated ringers bolus 500 mL (500 mLs Intravenous New Bag/Given 04/17/20 1748)  cefTRIAXone (ROCEPHIN) 1 g in sodium chloride 0.9 % 100 mL IVPB (1 g Intravenous New Bag/Given 04/17/20 1748)    ED Course  I have reviewed the triage vital signs and the nursing notes.  Pertinent labs & imaging results that were available during my care of the patient were reviewed by me and considered in my medical decision making (see chart for details).    MDM Rules/Calculators/A&P                          44 year old female who presents for evaluation of fever, abdominal pain, cough x2 days.  She also reports body aches.  She is not vaccinated for Covid.  She does report she had some Covid exposure.  She does report that she drinks daily alcohol.  Reports that she last drank about 4 shots of liquor this morning at 9 AM.  She reports she feels like her abdomen is swollen.  On initial arrival, she is febrile, tachypneic.  She has diffuse tenderness to the abdomen, slightly worse in the lower abdomen.  She is slightly distended but  no rigidity.  Plan check labs, Covid.  Repeat vitals show patient is tachycardic.  Patient given fluids, lactic.  Lactic is 2.5.  Lipase is normal.  Ammonia level is normal.  CMP shows sodium 132, potassium of 2.9.  BUN of 5, creatinine of 0.46.  LFTs are elevated with AST of 58, ALT of 55, alk phos of 148.  This is slightly improved.  She does have an anion gap.  I suspect this is alcoholic ketoacidosis.  UA does show positive nitrites, large leukocytes, pyuria. Code sepsis initiated. Patient started on broad-spectrum Zosyn.  We will add additional Rocephin.  RN inform me that patient was refusing CT scan.  Additionally, there was some concern about some slurred speech.  On my repeat evaluation, patient appeared slightly intoxicated, which was a change from when I saw  her.  She had gotten Ativan in the ED but nursing staff found a bottle that was suspected to be filled with alcohol that we think patient may have been drinking from.  CT abd/pelvis shows findings concerning for pyelonephritis.  No fluid collection noted.  Ill-defined fat planes adjacent to the pancreatic head.  She has hepatic steatosis and mild hepatomegaly.  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  At this time, concerned that she is septic from pyelonephritis. Patient started on Zosyn and Rocephin. We will plan to admit her.  Discussed patient with Dr. Clearence Ped (hospitalist) who accepts patient for admission.   Portions of this note were generated with [employer]. Dictation errors may occur despite best attempts at proofreading.    Final Clinical Impression(s) / ED Diagnoses Final diagnoses:  Sepsis, due to unspecified organism, unspecified whether acute organ dysfunction present [facility])  Alcoholic intoxication without complication [facility])    Rx / DC Orders ED Discharge Orders    None       Desma Mcgregor 04/17/20 2047    Noemi Chapel, MD 04/18/20 [phone]

## 2020-04-17 NOTE — ED Triage Notes (Signed)
Fever, abdominal swelling

## 2020-04-18 DIAGNOSIS — R7401 Elevation of levels of liver transaminase levels: Secondary | ICD-10-CM

## 2020-04-18 DIAGNOSIS — F10920 Alcohol use, unspecified with intoxication, uncomplicated: Secondary | ICD-10-CM

## 2020-04-18 DIAGNOSIS — K2920 Alcoholic gastritis without bleeding: Secondary | ICD-10-CM

## 2020-04-18 DIAGNOSIS — D696 Thrombocytopenia, unspecified: Secondary | ICD-10-CM

## 2020-04-18 DIAGNOSIS — F1210 Cannabis abuse, uncomplicated: Secondary | ICD-10-CM

## 2020-04-18 DIAGNOSIS — F1010 Alcohol abuse, uncomplicated: Secondary | ICD-10-CM

## 2020-04-18 DIAGNOSIS — F17200 Nicotine dependence, unspecified, uncomplicated: Secondary | ICD-10-CM

## 2020-04-18 DIAGNOSIS — N10 Acute tubulo-interstitial nephritis: Secondary | ICD-10-CM

## 2020-04-18 DIAGNOSIS — E876 Hypokalemia: Secondary | ICD-10-CM

## 2020-04-18 LAB — CBC
HCT: 29.6 % — ABNORMAL LOW (ref 36.0–46.0)
Hemoglobin: 10.4 g/dL — ABNORMAL LOW (ref 12.0–15.0)
MCH: 33.3 pg (ref 26.0–34.0)
MCHC: 35.1 g/dL (ref 30.0–36.0)
MCV: 94.9 fL (ref 80.0–100.0)
Platelets: 172 K/uL (ref 150–400)
RBC: 3.12 MIL/uL — ABNORMAL LOW (ref 3.87–5.11)
RDW: 15.9 % — ABNORMAL HIGH (ref 11.5–15.5)
WBC: 17.3 K/uL — ABNORMAL HIGH (ref 4.0–10.5)
nRBC: 0 % (ref 0.0–0.2)

## 2020-04-18 LAB — RAPID URINE DRUG SCREEN, HOSP PERFORMED
Amphetamines: NOT DETECTED
Barbiturates: NOT DETECTED
Benzodiazepines: NOT DETECTED
Cocaine: NOT DETECTED
Opiates: POSITIVE — AB
Tetrahydrocannabinol: POSITIVE — AB

## 2020-04-18 LAB — COMPREHENSIVE METABOLIC PANEL
ALT: 48 U/L — ABNORMAL HIGH (ref 0–44)
AST: 99 U/L — ABNORMAL HIGH (ref 15–41)
Albumin: 1.9 g/dL — ABNORMAL LOW (ref 3.5–5.0)
Alkaline Phosphatase: 121 U/L (ref 38–126)
Anion gap: 12 (ref 5–15)
BUN: <5 mg/dL — ABNORMAL LOW (ref 6–20)
CO2: 28 mmol/L (ref 22–32)
Calcium: 7 mg/dL — ABNORMAL LOW (ref 8.9–10.3)
Chloride: 90 mmol/L — ABNORMAL LOW (ref 98–111)
Creatinine, Ser: 0.47 mg/dL (ref 0.44–1.00)
GFR calc Af Amer: >60 mL/min (ref >60)
GFR calc non Af Amer: >60 mL/min (ref >60)
Glucose, Bld: 112 mg/dL — ABNORMAL HIGH (ref 70–99)
Potassium: 2.3 mmol/L — CL (ref 3.5–5.1)
Sodium: 130 mmol/L — ABNORMAL LOW (ref 135–145)
Total Bilirubin: 1.1 mg/dL (ref 0.3–1.2)
Total Protein: 4.8 g/dL — ABNORMAL LOW (ref 6.5–8.1)

## 2020-04-18 LAB — HIV ANTIBODY (ROUTINE TESTING W REFLEX): HIV Screen 4th Generation wRfx: NONREACTIVE

## 2020-04-18 LAB — TSH: TSH: 0.506 u[IU]/mL (ref 0.350–4.500)

## 2020-04-18 LAB — TROPONIN I (HIGH SENSITIVITY)
Troponin I (High Sensitivity): 51 ng/L — ABNORMAL HIGH (ref <18)
Troponin I (High Sensitivity): 41 ng/L — ABNORMAL HIGH (ref <18)

## 2020-04-18 LAB — MAGNESIUM: Magnesium: 1 mg/dL — ABNORMAL LOW (ref 1.7–2.4)

## 2020-04-18 MED ORDER — PANTOPRAZOLE SODIUM 40 MG PO TBEC
40.0000 mg | DELAYED_RELEASE_TABLET | Freq: Two times a day (BID) | ORAL | Status: DC
  Administered 2020-04-18 – 2020-04-23 (×11): 40 mg via ORAL
  Filled 2020-04-18 (×11): qty 1

## 2020-04-18 MED ORDER — AMLODIPINE BESYLATE 5 MG PO TABS: 5.0000 mg | ORAL_TABLET | Freq: Every day | ORAL | Status: DC

## 2020-04-18 MED ORDER — POTASSIUM CHLORIDE 10 MEQ/100ML IV SOLN
10.0000 meq | INTRAVENOUS | Status: AC
  Administered 2020-04-18 (×5): 10 meq via INTRAVENOUS
  Filled 2020-04-18 (×5): qty 100

## 2020-04-18 MED ORDER — OXYCODONE HCL 5 MG PO TABS
5.0000 mg | ORAL_TABLET | ORAL | Status: DC | PRN
  Administered 2020-04-18 – 2020-04-23 (×19): 5 mg via ORAL
  Filled 2020-04-18 (×20): qty 1

## 2020-04-18 MED ORDER — LORAZEPAM 2 MG/ML IJ SOLN
1.0000 mg | INTRAMUSCULAR | Status: AC | PRN
  Administered 2020-04-18 – 2020-04-20 (×6): 2 mg via INTRAVENOUS
  Filled 2020-04-18 (×3): qty 1

## 2020-04-18 MED ORDER — ACETAMINOPHEN 325 MG PO TABS
650.0000 mg | ORAL_TABLET | Freq: Four times a day (QID) | ORAL | Status: DC | PRN
  Administered 2020-04-18 – 2020-04-23 (×7): 650 mg via ORAL
  Filled 2020-04-18 (×8): qty 2

## 2020-04-18 MED ORDER — MAGNESIUM SULFATE 2 GM/50ML IV SOLN
2.0000 g | Freq: Once | INTRAVENOUS | Status: AC
  Administered 2020-04-18: 2 g via INTRAVENOUS
  Filled 2020-04-18: qty 50

## 2020-04-18 MED ORDER — LORAZEPAM 2 MG/ML IJ SOLN
0.0000 mg | INTRAMUSCULAR | Status: AC
  Administered 2020-04-18 (×2): 2 mg via INTRAVENOUS
  Administered 2020-04-18 (×2): 1 mg via INTRAVENOUS
  Administered 2020-04-19 (×3): 2 mg via INTRAVENOUS
  Filled 2020-04-18 (×9): qty 1

## 2020-04-18 MED ORDER — SODIUM CHLORIDE 0.9 % IV SOLN: INTRAVENOUS | Status: DC

## 2020-04-18 MED ORDER — NICOTINE 21 MG/24HR TD PT24
21.0000 mg | MEDICATED_PATCH | Freq: Every day | TRANSDERMAL | Status: DC
  Administered 2020-04-18 – 2020-04-23 (×6): 21 mg via TRANSDERMAL
  Filled 2020-04-18 (×6): qty 1

## 2020-04-18 MED ORDER — ADULT MULTIVITAMIN W/MINERALS CH
1.0000 | ORAL_TABLET | Freq: Every day | ORAL | Status: DC
  Administered 2020-04-19 – 2020-04-23 (×5): 1 via ORAL
  Filled 2020-04-18 (×5): qty 1

## 2020-04-18 MED ORDER — POLYETHYLENE GLYCOL 3350 17 G PO PACK
17.0000 g | PACK | Freq: Every day | ORAL | Status: DC | PRN
  Administered 2020-04-22: 17 g via ORAL
  Filled 2020-04-18: qty 1

## 2020-04-18 MED ORDER — SODIUM CHLORIDE 0.9 % IV SOLN
1.0000 g | INTRAVENOUS | Status: DC
  Administered 2020-04-18 – 2020-04-20 (×3): 1 g via INTRAVENOUS
  Filled 2020-04-18 (×3): qty 10

## 2020-04-18 MED ORDER — HEPARIN SODIUM (PORCINE) 5000 UNIT/ML IJ SOLN
5000.0000 [IU] | Freq: Three times a day (TID) | INTRAMUSCULAR | Status: DC
  Administered 2020-04-18 – 2020-04-23 (×17): 5000 [IU] via SUBCUTANEOUS
  Filled 2020-04-18 (×17): qty 1

## 2020-04-18 MED ORDER — FOLIC ACID 1 MG PO TABS
1.0000 mg | ORAL_TABLET | Freq: Every day | ORAL | Status: DC
  Administered 2020-04-19 – 2020-04-23 (×5): 1 mg via ORAL
  Filled 2020-04-18 (×5): qty 1

## 2020-04-18 MED ORDER — LORAZEPAM 2 MG/ML IJ SOLN
0.0000 mg | Freq: Three times a day (TID) | INTRAMUSCULAR | Status: AC
  Administered 2020-04-20 (×2): 2 mg via INTRAVENOUS
  Administered 2020-04-21 (×2): 1 mg via INTRAVENOUS
  Filled 2020-04-18 (×5): qty 1

## 2020-04-18 MED ORDER — ACETAMINOPHEN 650 MG RE SUPP: 650.0000 mg | Freq: Four times a day (QID) | RECTAL | Status: DC | PRN

## 2020-04-18 MED ORDER — CHLORHEXIDINE GLUCONATE CLOTH 2 % EX PADS
6.0000 | MEDICATED_PAD | Freq: Every day | CUTANEOUS | Status: DC
  Administered 2020-04-18 – 2020-04-21 (×3): 6 via TOPICAL

## 2020-04-18 MED ORDER — LORAZEPAM 1 MG PO TABS
1.0000 mg | ORAL_TABLET | ORAL | Status: AC | PRN
  Filled 2020-04-18: qty 2

## 2020-04-18 MED ORDER — THIAMINE HCL 100 MG PO TABS
100.0000 mg | ORAL_TABLET | Freq: Every day | ORAL | Status: DC
  Administered 2020-04-19 – 2020-04-23 (×5): 100 mg via ORAL
  Filled 2020-04-18 (×5): qty 1

## 2020-04-18 MED ORDER — POTASSIUM CHLORIDE IN NACL 40-0.9 MEQ/L-% IV SOLN
INTRAVENOUS | Status: DC
  Filled 2020-04-18 (×8): qty 1000

## 2020-04-18 MED ORDER — IPRATROPIUM-ALBUTEROL 0.5-2.5 (3) MG/3ML IN SOLN: 3.0000 mL | RESPIRATORY_TRACT | Status: DC | PRN

## 2020-04-18 NOTE — Progress Notes (Addendum)
PROGRESS NOTE  Olivia Lester LPF:[id number] DOB: Mar 16, 1976 DOA: 04/17/2020 PCP: Patient, No Pcp Per  Brief History:  44 year old female with a history of polysubstance abuse including alcohol, cannabis, tobacco, pancreatic insufficiency, hepatitis C, anxiety/depression, chronic back pain, chronic pain syndrome, thrombocytopenia, hypertension, severe malnutrition presenting with 3-day history of nausea, vomiting with associated myalgias, arthralgias, and worsening abdominal pain.  The patient states that her symptoms began on 04/14/2020.  Unfortunately, she continues to drink 4 shots of liquor on a daily basis.  She previously drank 1/5 on a daily basis.  She states that her last drink was on the day of admission.  However, it was noted that the patient had a bottle of liquor in the emergency department which she was consuming given to her medical care.  The patient also complained of fevers up to 104.0 F on 04/16/2020.  She denies any hematemesis, hematochezia, melena.  She complains of some dysuria. In the emergency department, the patient was febrile up to 100.6 F with tachycardia.  She was hemodynamically stable.  Oxygen saturation was 95% on room air.  BMP with sodium 132, potassium 2.9, serum creatinine 0.46.  WBC 16.2 with hemoglobin 12.5 and platelets 172,000.  The patient was started on ceftriaxone and alcohol withdrawal protocol.   Assessment/Plan: Severe Sepsis -present on admission -due to UTI -continue IVF -lactic acid peaked 2.5 -continue ceftriaxone pending cultures   Pyelonephritis -UA>50 WBC -04/17/2020 CT abdomen-heterogeneous left renal enhancement with a striated nephrogram.  There was perinephric stranding on the left without fluid collection.  There is slight enhancement of the right upper kidney. -Continue ceftriaxone pending culture data  Acute metabolic encephalopathy -Secondary to sepsis and alcohol -Mental status improving on the morning of  04/18/2020  Intractable nausea and vomiting -In part due to alcoholic gastritis -Start PPI -Continue IV fluids  Polysubstance abuse -Including tobacco, alcohol, and cannabis -Cessation discussed -nicoderm patch  Alcohol abuse -Alcohol withdrawal protocol  Essential hypertension -Holding amlodipine secondary to soft blood pressure  Hypokalemia/Hypomagnesemia -Replete -Check magnesium--1.0  Chronic thrombocytopenia -from EtOH abuse and sepsis, monitor -am CBC  Loose stools -likely due to pancreatic insufficiency -check cdiff  Chest pain -likely due to vomiting -atypical by history -cycle troponins -personally reviewed CXR--no edema or consolidation    Status is: Inpatient  Remains inpatient appropriate because:Hemodynamically unstable, Unsafe d/c plan and IV treatments appropriate due to intensity of illness or inability to take PO   Dispo: The patient is from: Home              Anticipated d/c is to: Home              Anticipated d/c date is: 2 days              Patient currently is not medically stable to d/c.        Family Communication:   No Family at bedside  Consultants:  none  Code Status:  FULL   DVT Prophylaxis:  [place] Heparin   Procedures: As Listed in Progress Note Above  Antibiotics: Ceftriaxone 9/24>>>     Subjective: Patient states vomiting is improving, but she remains nauseous.  Complains of lower>upper abd pain.  Denies headache, but complains of some sob.  Objective: Vitals:   04/18/20 0930 04/18/20 1000 04/18/20 1030 04/18/20 1045  BP: 105/72 107/70 (!) 127/92   Pulse:   64   Resp: (!) 32 (!) 22 (!) 21   Temp:  TempSrc:      SpO2:    98%  Weight:      Height:        Intake/Output Summary (Last 24 hours) at 04/18/2020 1145 Last data filed at 04/18/2020 1116 Gross per 24 hour  Intake 432.4 ml  Output --  Net 432.4 ml   Weight change:  Exam:   General:  Pt is alert, follows commands appropriately, not in  acute distress  HEENT: No icterus, No thrush, No neck mass, [place]/AT  Cardiovascular: RRR, S1/S2, no rubs, no gallops  Respiratory: CTA bilaterally, no wheezing, no crackles, no rhonchi  Abdomen: Soft/+BS, diffusely tender, non distended, no guarding  Extremities: No edema, No lymphangitis, No petechiae, No rashes, no synovitis   Data Reviewed: I have personally reviewed following labs and imaging studies Basic Metabolic Panel: Recent Labs  Lab 04/17/20 1453 04/18/20 0604  NA 132* 130*  K 2.9* 2.3*  CL 90* 90*  CO2 26 28  GLUCOSE 77 112*  BUN 5* <5*  CREATININE 0.46 0.47  CALCIUM 7.6* 7.0*  MG  --  1.0*   Liver Function Tests: Recent Labs  Lab 04/17/20 1453 04/18/20 0604  AST 58* 99*  ALT 55* 48*  ALKPHOS 148* 121  BILITOT 1.3* 1.1  PROT 5.9* 4.8*  ALBUMIN 2.5* 1.9*   Recent Labs  Lab 04/17/20 1453  LIPASE 16   Recent Labs  Lab 04/17/20 1453  AMMONIA 30   Coagulation Profile: Recent Labs  Lab 04/17/20 1922  INR 1.2   CBC: Recent Labs  Lab 04/17/20 1400 04/18/20 0604  WBC 16.2* 17.3*  NEUTROABS 13.5*  --   HGB 12.5 10.4*  HCT 37.4 29.6*  MCV 98.4 94.9  PLT 191 172   Cardiac Enzymes: No results for input(s): CKTOTAL, CKMB, CKMBINDEX, TROPONINI in the last 168 hours. BNP: Invalid input(s): POCBNP CBG: No results for input(s): GLUCAP in the last 168 hours. HbA1C: No results for input(s): HGBA1C in the last 72 hours. Urine analysis:    Component Value Date/Time   COLORURINE YELLOW 04/17/2020 1400   APPEARANCEUR HAZY (A) 04/17/2020 1400   APPEARANCEUR Clear 12/07/2013 0133   LABSPEC 1.008 04/17/2020 1400   LABSPEC 1.006 12/07/2013 0133   PHURINE 7.0 04/17/2020 1400   GLUCOSEU NEGATIVE 04/17/2020 1400   GLUCOSEU Negative 12/07/2013 0133   HGBUR MODERATE (A) 04/17/2020 1400   BILIRUBINUR NEGATIVE 04/17/2020 1400   BILIRUBINUR Negative 12/07/2013 0133   KETONESUR NEGATIVE 04/17/2020 1400   PROTEINUR 30 (A) 04/17/2020 1400    UROBILINOGEN 0.2 04/28/2015 1559   NITRITE POSITIVE (A) 04/17/2020 1400   LEUKOCYTESUR LARGE (A) 04/17/2020 1400   LEUKOCYTESUR Negative 12/07/2013 0133   Sepsis Labs: @LABRCNTIP (procalcitonin:4,lacticidven:4) ) Recent Results (from the past 240 hour(s))  Resp Panel by RT PCR (RSV, Flu A&B, Covid) - Nasopharyngeal Swab     Status: None   Collection Time: 04/17/20  1:15 PM   Specimen: Nasopharyngeal Swab  Result Value Ref Range Status   SARS Coronavirus 2 by RT PCR NEGATIVE NEGATIVE Final    Comment: (NOTE) SARS-CoV-2 target nucleic acids are NOT DETECTED.  The SARS-CoV-2 RNA is generally detectable in upper respiratoy specimens during the acute phase of infection. The lowest concentration of SARS-CoV-2 viral copies this assay can detect is 131 copies/mL. A negative result does not preclude SARS-Cov-2 infection and should not be used as the sole basis for treatment or other patient management decisions. A negative result may occur with  improper specimen collection/handling, submission of specimen other than nasopharyngeal  swab, presence of viral mutation(s) within the areas targeted by this assay, and inadequate number of viral copies (<131 copies/mL). A negative result must be combined with clinical observations, patient history, and epidemiological information. The expected result is Negative.  Fact Sheet for Patients:  PinkCheek.be  Fact Sheet for Healthcare Providers:  GravelBags.it  This test is no t yet approved or cleared by the [place] FDA and  has been authorized for detection and/or diagnosis of SARS-CoV-2 by FDA under an Emergency Use Authorization (EUA). This EUA will remain  in effect (meaning this test can be used) for the duration of the COVID-19 declaration under Section 564(b)(1) of the Act, 21 U.S.C. section 360bbb-3(b)(1), unless the authorization is terminated or revoked sooner.      Influenza A by PCR NEGATIVE NEGATIVE Final   Influenza B by PCR NEGATIVE NEGATIVE Final    Comment: (NOTE) The Xpert Xpress SARS-CoV-2/FLU/RSV assay is intended as an aid in  the diagnosis of influenza from Nasopharyngeal swab specimens and  should not be used as a sole basis for treatment. Nasal washings and  aspirates are unacceptable for Xpert Xpress SARS-CoV-2/FLU/RSV  testing.  Fact Sheet for Patients: PinkCheek.be  Fact Sheet for Healthcare Providers: GravelBags.it  This test is not yet approved or cleared by the [place] FDA and  has been authorized for detection and/or diagnosis of SARS-CoV-2 by  FDA under an Emergency Use Authorization (EUA). This EUA will remain  in effect (meaning this test can be used) for the duration of the  Covid-19 declaration under Section 564(b)(1) of the Act, 21  U.S.C. section 360bbb-3(b)(1), unless the authorization is  terminated or revoked.    Respiratory Syncytial Virus by PCR NEGATIVE NEGATIVE Final    Comment: (NOTE) Fact Sheet for Patients: PinkCheek.be  Fact Sheet for Healthcare Providers: GravelBags.it  This test is not yet approved or cleared by the [place] FDA and  has been authorized for detection and/or diagnosis of SARS-CoV-2 by  FDA under an Emergency Use Authorization (EUA). This EUA will remain  in effect (meaning this test can be used) for the duration of the  COVID-19 declaration under Section 564(b)(1) of the Act, 21 U.S.C.  section 360bbb-3(b)(1), unless the authorization is terminated or  revoked. Performed at [facility], [address], [place], [place] [address]   Blood Culture (routine x 2)     Status: None (Preliminary result)   Collection Time: 04/17/20  4:11 PM   Specimen: BLOOD RIGHT HAND  Result Value Ref Range Status   Specimen Description BLOOD RIGHT HAND  Final   Special Requests    Final    BOTTLES DRAWN AEROBIC ONLY Blood Culture results may not be optimal due to an inadequate volume of blood received in culture bottles   Culture   Final    NO GROWTH < 24 HOURS Performed at [facility], [address], [place], [place] [address]    Report Status PENDING  Incomplete  Blood Culture (routine x 2)     Status: None (Preliminary result)   Collection Time: 04/17/20  7:22 PM   Specimen: Left Antecubital; Blood  Result Value Ref Range Status   Specimen Description LEFT ANTECUBITAL  Final   Special Requests   Final    BOTTLES DRAWN AEROBIC ONLY Blood Culture results may not be optimal due to an inadequate volume of blood received in culture bottles   Culture   Final    NO GROWTH < 12 HOURS Performed at [facility],  [address], [place], [place] [address]    Report Status PENDING  Incomplete     Scheduled Meds: . folic acid  1 mg Oral Daily  . heparin  5,000 Units Subcutaneous Q8H  . LORazepam  0-4 mg Intravenous Q4H   Followed by  . [START ON 04/20/2020] LORazepam  0-4 mg Intravenous Q8H  . multivitamin with minerals  1 tablet Oral Daily  . nicotine  21 mg Transdermal Daily  . thiamine  100 mg Oral Daily   Continuous Infusions: . 0.9 % NaCl with KCl 40 mEq / L    . cefTRIAXone (ROCEPHIN)  IV    . potassium chloride 10 mEq (04/18/20 1118)    Procedures/Studies: CT ABDOMEN PELVIS W CONTRAST  Result Date: 04/17/2020 CLINICAL DATA:  Acute nonlocalized abdominal pain. Fever and malaise. Abdominal swelling. EXAM: CT ABDOMEN AND PELVIS WITH CONTRAST TECHNIQUE: Multidetector CT imaging of the abdomen and pelvis was performed using the standard protocol following bolus administration of intravenous contrast. CONTRAST:  19mL OMNIPAQUE IOHEXOL 300 MG/ML  SOLN COMPARISON:  Most recent CT 01/26/2020 FINDINGS: Lower chest: No pleural fluid or focal airspace disease. The heart is normal in size. Hepatobiliary: Again seen advanced hepatic steatosis. Mildly enlarged liver  spanning 21 cm cranial caudal. No evidence of focal hepatic lesion. Patent portal vein. Pancreas: Ill-defined fat planes adjacent to the pancreatic head. No acute peripancreatic collection. No ductal dilatation. Spleen: Normal in size without focal abnormality. Adrenals/Urinary Tract: No adrenal nodule. Diffusely heterogeneous left renal enhancement with striated nephrogram in areas of diminished perfusion involving the upper, mid lower kidney. There is left perinephric edema. No evidence of perirenal fluid collection. No renal or ureteral calculi. Slight heterogeneous enhancement of the upper right kidney. Minimal adjacent perinephric edema. The urinary bladder is physiologically distended without wall thickening. Stomach/Bowel: Bowel evaluation is limited in the absence of enteric contrast as well as mild motion artifact. Decompressed stomach. Normal positioning of the duodenum and ligament of Treitz. There is no small bowel obstruction or inflammatory change. Normal appendix. Small volume of colonic stool. There is no colonic wall thickening or inflammation. Vascular/Lymphatic: Moderate aorto bi-iliac atherosclerosis, advanced for age. No aortic aneurysm. The portal vein is patent. There multiple prominent retroperitoneal lymph nodes are borderline enlarged. Prominent central mesenteric nodes. The renal arteries appear patent. Reproductive: Free fluid adjacent to the uterus which is otherwise unremarkable. 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 Other: Small amount of free fluid in the pelvis. There is no upper abdominal ascites. No abdominal wall hernia. There is generalized body wall edema. Musculoskeletal: Posterior lumbar fusion with intact hardware. There are no acute or suspicious osseous abnormalities. IMPRESSION: 1. Heterogeneous left renal enhancement with striated nephrogram and areas of diminished perfusion involving the upper, mid lower kidney, consistent with pyelonephritis. No perirenal fluid  collection. Findings also suspicious for pyelonephritis of the upper pole of the right kidney. 2. Ill-defined fat planes adjacent to the pancreatic head, may represent acute pancreatitis. Recommend correlation with pancreatic enzymes. 3. Advanced hepatic steatosis and mild hepatomegaly. 4. 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 5. Small amount of free fluid in the pelvis. Generalized body wall edema. 6. Aortic atherosclerosis is advanced for age. Aortic Atherosclerosis (ICD10-I70.0). Electronically Signed   By: Keith Rake M.D.   On: 04/17/2020 19:03   DG Chest Portable 1 View  Result Date: 04/17/2020 CLINICAL DATA:  Cough EXAM: PORTABLE CHEST 1 VIEW COMPARISON:  09/04/2019 chest radiograph and prior. FINDINGS: The heart size and mediastinal contours are within normal limits.  Both lungs are clear. No pneumothorax or pleural effusion. No acute osseous abnormality. IMPRESSION: No focal airspace disease. Electronically Signed   By: Primitivo Gauze M.D.   On: 04/17/2020 14:52    Orson Eva, DO  [facility]  If 7PM-7AM, please contact night-coverage [email or web address] Password [email or web address] 04/18/2020, 11:45 AM   LOS: 1 day

## 2020-04-18 NOTE — ED Notes (Signed)
ED TO INPATIENT HANDOFF REPORT  ED Nurse Name and Phone #:   S Name/Age/Gender Aija Basil 44 y.o. female Room/Bed: APA17/APA17  Code Status   Code Status: Full Code  Home/SNF/Other Home Patient oriented to: self, place, time and situation Is this baseline? Yes   Triage Complete: Triage complete  Chief Complaint Sepsis [facility]) [A41.9]  Triage Note Fever, abdominal swelling    Allergies Allergies  Allergen Reactions  . Toradol [Ketorolac Tromethamine] Other (See Comments)    Patient advised not to take due to ulcers  . Asa [Aspirin] Rash    Level of Care/Admitting Diagnosis ED Disposition    ED Disposition Condition [facility] Area: [facility] [[id number]]  Level of Care: Stepdown [14]  Covid Evaluation: Confirmed COVID Negative  Diagnosis: Sepsis [facility]) [[id number]]  Admitting Physician: Rolla Plate [[id number]]  Attending Physician: Rolla Plate [[id number]]  Estimated length of stay: past midnight tomorrow  Certification:: I certify this patient will need inpatient services for at least 2 midnights       B Medical/Surgery History Past Medical History:  Diagnosis Date  . Alcohol abuse   . Back injury   . Gastric ulcer   . Gastric ulcer 10/14/2013  . Hepatitis C 10/14/2013  . History of kidney stones   . 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 Pancreatitis, acute   . Panic attack   . Pneumonia   . Polysubstance abuse ([place])   . PONV (postoperative nausea and vomiting)   . Seizures ([place])    Past Surgical History:  Procedure Laterality Date  . BACK SURGERY    . BREAST SURGERY    . ESOPHAGOGASTRODUODENOSCOPY (EGD) WITH PROPOFOL N/A 10/14/2013   Procedure: ESOPHAGOGASTRODUODENOSCOPY (EGD) WITH PROPOFOL;  Surgeon: Rogene Houston, MD;  Location: [facility];  Service: Endoscopy;  Laterality: N/A;  . ORIF TOE FRACTURE Right 08/08/2018   Procedure: RIGHT FOOT INTERNAL FIXATION 5TH METATARSAL;  Surgeon: Newt Minion, MD;   Location: [place];  Service: Orthopedics;  Laterality: Right;  . TONSILLECTOMY       A IV Location/Drains/Wounds Patient Lines/Drains/Airways Status    Active Line/Drains/Airways    Name Placement date Placement time Site Days   Peripheral IV 04/17/20 Left;Posterior Forearm 04/17/20  1404  Forearm  1          Intake/Output Last 24 hours  Intake/Output Summary (Last 24 hours) at 04/18/2020 1633 Last data filed at 04/18/2020 1501 Gross per 24 hour  Intake 1332.4 ml  Output --  Net 1332.4 ml    Labs/Imaging Results for orders placed or performed during the hospital encounter of 04/17/20 (from the past 48 hour(s))  Resp Panel by RT PCR (RSV, Flu A&B, Covid) - Nasopharyngeal Swab     Status: None   Collection Time: 04/17/20  1:15 PM   Specimen: Nasopharyngeal Swab  Result Value Ref Range   SARS Coronavirus 2 by RT PCR NEGATIVE NEGATIVE    Comment: (NOTE) SARS-CoV-2 target nucleic acids are NOT DETECTED.  The SARS-CoV-2 RNA is generally detectable in upper respiratoy specimens during the acute phase of infection. The lowest concentration of SARS-CoV-2 viral copies this assay can detect is 131 copies/mL. A negative result does not preclude SARS-Cov-2 infection and should not be used as the sole basis for treatment or other patient management decisions. A negative result may occur with  improper specimen collection/handling, submission of specimen other than nasopharyngeal swab, presence of viral mutation(s) within the areas  targeted by this assay, and inadequate number of viral copies (<131 copies/mL). A negative result must be combined with clinical observations, patient history, and epidemiological information. The expected result is Negative.  Fact Sheet for Patients:  PinkCheek.be  Fact Sheet for Healthcare Providers:  GravelBags.it  This test is no t yet approved or cleared by the [place] FDA and  has been  authorized for detection and/or diagnosis of SARS-CoV-2 by FDA under an Emergency Use Authorization (EUA). This EUA will remain  in effect (meaning this test can be used) for the duration of the COVID-19 declaration under Section 564(b)(1) of the Act, 21 U.S.C. section 360bbb-3(b)(1), unless the authorization is terminated or revoked sooner.     Influenza A by PCR NEGATIVE NEGATIVE   Influenza B by PCR NEGATIVE NEGATIVE    Comment: (NOTE) The Xpert Xpress SARS-CoV-2/FLU/RSV assay is intended as an aid in  the diagnosis of influenza from Nasopharyngeal swab specimens and  should not be used as a sole basis for treatment. Nasal washings and  aspirates are unacceptable for Xpert Xpress SARS-CoV-2/FLU/RSV  testing.  Fact Sheet for Patients: PinkCheek.be  Fact Sheet for Healthcare Providers: GravelBags.it  This test is not yet approved or cleared by the [place] FDA and  has been authorized for detection and/or diagnosis of SARS-CoV-2 by  FDA under an Emergency Use Authorization (EUA). This EUA will remain  in effect (meaning this test can be used) for the duration of the  Covid-19 declaration under Section 564(b)(1) of the Act, 21  U.S.C. section 360bbb-3(b)(1), unless the authorization is  terminated or revoked.    Respiratory Syncytial Virus by PCR NEGATIVE NEGATIVE    Comment: (NOTE) Fact Sheet for Patients: PinkCheek.be  Fact Sheet for Healthcare Providers: GravelBags.it  This test is not yet approved or cleared by the [place] FDA and  has been authorized for detection and/or diagnosis of SARS-CoV-2 by  FDA under an Emergency Use Authorization (EUA). This EUA will remain  in effect (meaning this test can be used) for the duration of the  COVID-19 declaration under Section 564(b)(1) of the Act, 21 U.S.C.  section 360bbb-3(b)(1), unless the  authorization is terminated or  revoked. Performed at [facility], [address] [place], [place] [address]   CBC with Differential     Status: Abnormal   Collection Time: 04/17/20  2:00 PM  Result Value Ref Range   WBC 16.2 (H) 4.0 - 10.5 K/uL   RBC 3.80 (L) 3.87 - 5.11 MIL/uL   Hemoglobin 12.5 12.0 - 15.0 g/dL   HCT 37.4 36 - 46 %   MCV 98.4 80.0 - 100.0 fL   MCH 32.9 26.0 - 34.0 pg   MCHC 33.4 30.0 - 36.0 g/dL   RDW 15.9 (H) 11.5 - 15.5 %   Platelets 191 150 - 400 K/uL   nRBC 0.0 0.0 - 0.2 %   Neutrophils Relative % 83 %   Neutro Abs 13.5 (H) 1.7 - 7.7 K/uL   Lymphocytes Relative 8 %   Lymphs Abs 1.3 0.7 - 4.0 K/uL   Monocytes Relative 5 %   Monocytes Absolute 0.8 0 - 1 K/uL   Eosinophils Relative 0 %   Eosinophils Absolute 0.0 0 - 0 K/uL   Basophils Relative 1 %   Basophils Absolute 0.1 0 - 0 K/uL   Immature Granulocytes 3 %   Abs Immature Granulocytes 0.51 (H) 0.00 - 0.07 K/uL    Comment: Performed at [facility], [address]  [place], [place] [address]  Urinalysis, Routine w reflex microscopic Urine, Clean Catch     Status: Abnormal   Collection Time: 04/17/20  2:00 PM  Result Value Ref Range   Color, Urine YELLOW YELLOW   APPearance HAZY (A) CLEAR   Specific Gravity, Urine 1.008 1.005 - 1.030   pH 7.0 5.0 - 8.0   Glucose, UA NEGATIVE NEGATIVE mg/dL   Hgb urine dipstick MODERATE (A) NEGATIVE   Bilirubin Urine NEGATIVE NEGATIVE   Ketones, ur NEGATIVE NEGATIVE mg/dL   Protein, ur 30 (A) NEGATIVE mg/dL   Nitrite POSITIVE (A) NEGATIVE   Leukocytes,Ua LARGE (A) NEGATIVE   RBC / HPF 11-20 0 - 5 RBC/hpf   WBC, UA >50 (H) 0 - 5 WBC/hpf   Bacteria, UA MANY (A) NONE SEEN   Squamous Epithelial / LPF 0-5 0 - 5   Budding Yeast PRESENT     Comment: Performed at [facility], [address] [place], [place] [address]  Pregnancy, urine     Status: None   Collection Time: 04/17/20  2:00 PM  Result Value Ref Range   Preg Test, Ur NEGATIVE NEGATIVE    Comment:         THE SENSITIVITY OF THIS METHODOLOGY IS >20 mIU/mL. Performed at [facility], [address], [place], [place] [address]   Urine rapid drug screen (hosp performed)     Status: Abnormal   Collection Time: 04/17/20  2:00 PM  Result Value Ref Range   Opiates POSITIVE (A) NONE DETECTED   Cocaine NONE DETECTED NONE DETECTED   Benzodiazepines NONE DETECTED NONE DETECTED   Amphetamines NONE DETECTED NONE DETECTED   Tetrahydrocannabinol POSITIVE (A) NONE DETECTED   Barbiturates NONE DETECTED NONE DETECTED    Comment: (NOTE) DRUG SCREEN FOR MEDICAL PURPOSES ONLY.  IF CONFIRMATION IS NEEDED FOR ANY PURPOSE, NOTIFY LAB WITHIN 5 DAYS.  LOWEST DETECTABLE LIMITS FOR URINE DRUG SCREEN Drug Class                     Cutoff (ng/mL) Amphetamine and metabolites    1000 Barbiturate and metabolites    200 Benzodiazepine                 440 Tricyclics and metabolites     300 Opiates and metabolites        300 Cocaine and metabolites        300 THC                            50 Performed at [facility], [address], [place], [place] [address]   Lactic acid, plasma     Status: Abnormal   Collection Time: 04/17/20  2:53 PM  Result Value Ref Range   Lactic Acid, Venous 2.5 (HH) 0.5 - 1.9 mmol/L    Comment: CRITICAL RESULT CALLED TO, READ BACK BY AND VERIFIED WITH: TUTTLE,A ON 04/17/20 AT 5366 BY LOY,C Performed at [facility], [address], [place], [place] [address]   Ammonia     Status: None   Collection Time: 04/17/20  2:53 PM  Result Value Ref Range   Ammonia 30 9 - 35 umol/L    Comment: Performed at [facility], [address] [place],  [address]  Comprehensive metabolic panel     Status: Abnormal   Collection Time: 04/17/20  2:53 PM  Result Value Ref Range   Sodium 132 (L) 135 - 145 mmol/L   Potassium  2.9 (L) 3.5 - 5.1 mmol/L   Chloride 90 (L) 98 - 111 mmol/L   CO2 26 22 - 32 mmol/L   Glucose, Bld 77 70 - 99 mg/dL    Comment: Glucose reference range applies only to  samples taken after fasting for at least 8 hours.   BUN 5 (L) 6 - 20 mg/dL   Creatinine, Ser 0.46 0.44 - 1.00 mg/dL   Calcium 7.6 (L) 8.9 - 10.3 mg/dL   Total Protein 5.9 (L) 6.5 - 8.1 g/dL   Albumin 2.5 (L) 3.5 - 5.0 g/dL   AST 58 (H) 15 - 41 U/L   ALT 55 (H) 0 - 44 U/L   Alkaline Phosphatase 148 (H) 38 - 126 U/L   Total Bilirubin 1.3 (H) 0.3 - 1.2 mg/dL   GFR calc non Af Amer >60 >60 mL/min   GFR calc Af Amer >60 >60 mL/min   Anion gap 16 (H) 5 - 15    Comment: Performed at [facility], [address], [place], [place] [address]  Lipase, blood     Status: None   Collection Time: 04/17/20  2:53 PM  Result Value Ref Range   Lipase 16 11 - 51 U/L    Comment: Performed at [facility], [address], [place], [place] [address]  Lactic acid, plasma     Status: Abnormal   Collection Time: 04/17/20  4:11 PM  Result Value Ref Range   Lactic Acid, Venous 2.2 (HH) 0.5 - 1.9 mmol/L    Comment: CRITICAL VALUE NOTED.  VALUE IS CONSISTENT WITH PREVIOUSLY REPORTED AND CALLED VALUE. Performed at [facility], [address] [place], [place] [address]   Blood Culture (routine x 2)     Status: None (Preliminary result)   Collection Time: 04/17/20  4:11 PM   Specimen: Peripheral; Blood  Result Value Ref Range   Specimen Description BLOOD RIGHT HAND    Special Requests      BOTTLES DRAWN AEROBIC ONLY Blood Culture results may not be optimal due to an inadequate volume of blood received in culture bottles   Culture      NO GROWTH < 24 HOURS Performed at [facility], [address], [place], [place] [address]    Report Status PENDING   Protime-INR     Status: None   Collection Time: 04/17/20  7:22 PM  Result Value Ref Range   Prothrombin Time 14.4 11.4 - 15.2 seconds   INR 1.2 0.8 - 1.2    Comment: (NOTE) INR goal varies based on device and disease states. Performed at [facility], [address], [place], [place] [address]   APTT     Status: Abnormal   Collection Time: 04/17/20  7:22 PM   Result Value Ref Range   aPTT 44 (H) 24 - 36 seconds    Comment:        IF BASELINE aPTT IS ELEVATED, SUGGEST PATIENT RISK ASSESSMENT BE USED TO DETERMINE APPROPRIATE ANTICOAGULANT THERAPY. Performed at [facility], [address] [place],  [address]   Blood Culture (routine x 2)     Status: None (Preliminary result)   Collection Time: 04/17/20  7:22 PM   Specimen: Peripheral; Blood  Result Value Ref Range   Specimen Description LEFT ANTECUBITAL    Special Requests      BOTTLES DRAWN AEROBIC ONLY Blood Culture results may not be optimal due to an inadequate volume of blood received in culture bottles   Culture  NO GROWTH < 12 HOURS Performed at [facility], [address] [place], [place] [address]    Report Status PENDING   Ethanol     Status: None   Collection Time: 04/17/20  7:22 PM  Result Value Ref Range   Alcohol, Ethyl (B) <10 <10 mg/dL    Comment: (NOTE) Lowest detectable limit for serum alcohol is 10 mg/dL.  For medical purposes only. Performed at [facility], [address], [place], [place] [address]   HIV Antibody (routine testing w rflx)     Status: None   Collection Time: 04/18/20  6:04 AM  Result Value Ref Range   HIV Screen 4th Generation wRfx Non Reactive Non Reactive    Comment: Performed at [facility], [place] [address] [place], [place] [address]  Magnesium     Status: Abnormal   Collection Time: 04/18/20  6:04 AM  Result Value Ref Range   Magnesium 1.0 (L) 1.7 - 2.4 mg/dL    Comment: Performed at [facility], [address], [place], [place] [address]  CBC     Status: Abnormal   Collection Time: 04/18/20  6:04 AM  Result Value Ref Range   WBC 17.3 (H) 4.0 - 10.5 K/uL   RBC 3.12 (L) 3.87 - 5.11 MIL/uL   Hemoglobin 10.4 (L) 12.0 - 15.0 g/dL   HCT 29.6 (L) 36 - 46 %   MCV 94.9 80.0 - 100.0 fL   MCH 33.3 26.0 - 34.0 pg   MCHC 35.1 30.0 - 36.0 g/dL   RDW 15.9 (H) 11.5 - 15.5 %   Platelets 172 150 - 400 K/uL   nRBC 0.0 0.0 - 0.2  %    Comment: Performed at [facility], [address], [place], [place] [address]  Comprehensive metabolic panel     Status: Abnormal   Collection Time: 04/18/20  6:04 AM  Result Value Ref Range   Sodium 130 (L) 135 - 145 mmol/L   Potassium 2.3 (LL) 3.5 - 5.1 mmol/L    Comment: CRITICAL RESULT CALLED TO, READ BACK BY AND VERIFIED WITH: HOLCULM R. @ 0805 ON [id number] BY HENDERSON L,    Chloride 90 (L) 98 - 111 mmol/L   CO2 28 22 - 32 mmol/L   Glucose, Bld 112 (H) 70 - 99 mg/dL    Comment: Glucose reference range applies only to samples taken after fasting for at least 8 hours.   BUN <5 (L) 6 - 20 mg/dL   Creatinine, Ser 0.47 0.44 - 1.00 mg/dL   Calcium 7.0 (L) 8.9 - 10.3 mg/dL   Total Protein 4.8 (L) 6.5 - 8.1 g/dL   Albumin 1.9 (L) 3.5 - 5.0 g/dL   AST 99 (H) 15 - 41 U/L   ALT 48 (H) 0 - 44 U/L   Alkaline Phosphatase 121 38 - 126 U/L   Total Bilirubin 1.1 0.3 - 1.2 mg/dL   GFR calc non Af Amer >60 >60 mL/min   GFR calc Af Amer >60 >60 mL/min   Anion gap 12 5 - 15    Comment: Performed at [facility], [address] [place], [place] [address]  TSH     Status: None   Collection Time: 04/18/20  6:04 AM  Result Value Ref Range   TSH 0.506 0.350 - 4.500 uIU/mL    Comment: Performed by a 3rd Generation assay with a functional sensitivity of <=0.01 uIU/mL. Performed at [facility], [address], [place],  [address]   Troponin I (  High Sensitivity)     Status: Abnormal   Collection Time: 04/18/20  1:36 PM  Result Value Ref Range   Troponin I (High Sensitivity) 51 (H) <18 ng/L    Comment: (NOTE) Elevated high sensitivity troponin I (hsTnI) values and significant  changes across serial measurements may suggest ACS but many other  chronic and acute conditions are known to elevate hsTnI results.  Refer to the "Links" section for chest pain algorithms and additional  guidance. Performed at [facility], [address], [place], [place] [address]   Troponin I (High Sensitivity)      Status: Abnormal   Collection Time: 04/18/20  3:38 PM  Result Value Ref Range   Troponin I (High Sensitivity) 41 (H) <18 ng/L    Comment: (NOTE) Elevated high sensitivity troponin I (hsTnI) values and significant  changes across serial measurements may suggest ACS but many other  chronic and acute conditions are known to elevate hsTnI results.  Refer to the "Links" section for chest pain algorithms and additional  guidance. Performed at [facility], [address] [place], [place] [address]    CT ABDOMEN PELVIS W CONTRAST  Result Date: 04/17/2020 CLINICAL DATA:  Acute nonlocalized abdominal pain. Fever and malaise. Abdominal swelling. EXAM: CT ABDOMEN AND PELVIS WITH CONTRAST TECHNIQUE: Multidetector CT imaging of the abdomen and pelvis was performed using the standard protocol following bolus administration of intravenous contrast. CONTRAST:  115mL OMNIPAQUE IOHEXOL 300 MG/ML  SOLN COMPARISON:  Most recent CT 01/26/2020 FINDINGS: Lower chest: No pleural fluid or focal airspace disease. The heart is normal in size. Hepatobiliary: Again seen advanced hepatic steatosis. Mildly enlarged liver spanning 21 cm cranial caudal. No evidence of focal hepatic lesion. Patent portal vein. Pancreas: Ill-defined fat planes adjacent to the pancreatic head. No acute peripancreatic collection. No ductal dilatation. Spleen: Normal in size without focal abnormality. Adrenals/Urinary Tract: No adrenal nodule. Diffusely heterogeneous left renal enhancement with striated nephrogram in areas of diminished perfusion involving the upper, mid lower kidney. There is left perinephric edema. No evidence of perirenal fluid collection. No renal or ureteral calculi. Slight heterogeneous enhancement of the upper right kidney. Minimal adjacent perinephric edema. The urinary bladder is physiologically distended without wall thickening. Stomach/Bowel: Bowel evaluation is limited in the absence of enteric contrast as well as mild  motion artifact. Decompressed stomach. Normal positioning of the duodenum and ligament of Treitz. There is no small bowel obstruction or inflammatory change. Normal appendix. Small volume of colonic stool. There is no colonic wall thickening or inflammation. Vascular/Lymphatic: Moderate aorto bi-iliac atherosclerosis, advanced for age. No aortic aneurysm. The portal vein is patent. There multiple prominent retroperitoneal lymph nodes are borderline enlarged. Prominent central mesenteric nodes. The renal arteries appear patent. Reproductive: Free fluid adjacent to the uterus which is otherwise unremarkable. 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 Other: Small amount of free fluid in the pelvis. There is no upper abdominal ascites. No abdominal wall hernia. There is generalized body wall edema. Musculoskeletal: Posterior lumbar fusion with intact hardware. There are no acute or suspicious osseous abnormalities. IMPRESSION: 1. Heterogeneous left renal enhancement with striated nephrogram and areas of diminished perfusion involving the upper, mid lower kidney, consistent with pyelonephritis. No perirenal fluid collection. Findings also suspicious for pyelonephritis of the upper pole of the right kidney. 2. Ill-defined fat planes adjacent to the pancreatic head, may represent acute pancreatitis. Recommend correlation with pancreatic enzymes. 3. Advanced hepatic steatosis and mild hepatomegaly. 4. 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 5. Small amount of free  fluid in the pelvis. Generalized body wall edema. 6. Aortic atherosclerosis is advanced for age. Aortic Atherosclerosis (ICD10-I70.0). Electronically Signed   By: Keith Rake M.D.   On: 04/17/2020 19:03   DG Chest Portable 1 View  Result Date: 04/17/2020 CLINICAL DATA:  Cough EXAM: PORTABLE CHEST 1 VIEW COMPARISON:  09/04/2019 chest radiograph and prior. FINDINGS: The heart size and mediastinal contours are within normal limits. Both lungs are clear. No  pneumothorax or pleural effusion. No acute osseous abnormality. IMPRESSION: No focal airspace disease. Electronically Signed   By: Primitivo Gauze M.D.   On: 04/17/2020 14:52    Pending Labs Unresulted Labs (From admission, onward)          Start     Ordered   04/19/20 0500  Comprehensive metabolic panel  Tomorrow morning,   R        04/18/20 1205   04/19/20 0500  Magnesium  Tomorrow morning,   R        04/18/20 1205   04/19/20 0500  CBC with Differential/Platelet  Tomorrow morning,   R        04/18/20 1208   04/18/20 1209  C Difficile Quick Screen w PCR reflex  (C Difficile quick screen w PCR reflex panel)  Once, for 24 hours,   STAT       References:&nbsp;&nbsp;&nbsp;&nbsp;CDiff Information Tool   04/18/20 1208   04/17/20 1557  Urine culture  (Septic presentation on arrival (screening labs, nursing and treatment orders for obvious sepsis))  ONCE - STAT,   STAT        04/17/20 1557          Vitals/Pain Today's Vitals   04/18/20 1030 04/18/20 1045 04/18/20 1620 04/18/20 1623  BP: (!) 127/92  (!) 149/98 (!) 149/98  Pulse: 64  92 92  Resp: (!) 21  16   Temp:      TempSrc:      SpO2:  98% 96%   Weight:      Height:      PainSc:        Isolation Precautions Enteric precautions (UV disinfection)  Medications Medications  cefTRIAXone (ROCEPHIN) 1 g in sodium chloride 0.9 % 100 mL IVPB (has no administration in time range)  thiamine tablet 100 mg (100 mg Oral Not Given 11/15/51 6144)  folic acid (FOLVITE) tablet 1 mg (1 mg Oral Not Given 04/18/20 1022)  heparin injection 5,000 Units (5,000 Units Subcutaneous Given 04/18/20 1508)  acetaminophen (TYLENOL) tablet 650 mg (650 mg Oral Given 04/18/20 1140)    Or  acetaminophen (TYLENOL) suppository 650 mg ( Rectal See Alternative 04/18/20 1140)  oxyCODONE (Oxy IR/ROXICODONE) immediate release tablet 5 mg (has no administration in time range)  polyethylene glycol (MIRALAX / GLYCOLAX) packet 17 g (has no administration in time  range)  nicotine (NICODERM CQ - dosed in mg/24 hours) patch 21 mg (21 mg Transdermal Patch Applied 04/18/20 1021)  LORazepam (ATIVAN) tablet 1-4 mg (has no administration in time range)    Or  LORazepam (ATIVAN) injection 1-4 mg (has no administration in time range)  multivitamin with minerals tablet 1 tablet (1 tablet Oral Not Given 04/18/20 1024)  LORazepam (ATIVAN) injection 0-4 mg (0 mg Intravenous Not Given 04/18/20 1023)    Followed by  LORazepam (ATIVAN) injection 0-4 mg (has no administration in time range)  ipratropium-albuterol (DUONEB) 0.5-2.5 (3) MG/3ML nebulizer solution 3 mL (has no administration in time range)  0.9 % NaCl with KCl 40 mEq / L  infusion (  Intravenous New Bag/Given 04/18/20 1507)  pantoprazole (PROTONIX) EC tablet 40 mg (has no administration in time range)  sodium chloride 0.9 % bolus 1,000 mL (0 mLs Intravenous Stopping Infusion hung by another clincian 04/18/20 0806)  ondansetron (ZOFRAN) injection 4 mg (4 mg Intravenous Given 04/17/20 1420)  morphine 2 MG/ML injection 2 mg (2 mg Intravenous Given 04/17/20 1426)  piperacillin-tazobactam (ZOSYN) IVPB 3.375 g (0 g Intravenous Stopped 04/17/20 1555)  LORazepam (ATIVAN) injection 2 mg (2 mg Intravenous Given 04/17/20 1519)  potassium chloride 10 mEq in 100 mL IVPB (0 mEq Intravenous Stopped 04/17/20 2106)  acetaminophen (TYLENOL) tablet 1,000 mg (1,000 mg Oral Given 04/17/20 1750)  iohexol (OMNIPAQUE) 300 MG/ML solution 100 mL (100 mLs Intravenous Contrast Given 04/17/20 1819)  lactated ringers bolus 500 mL (0 mLs Intravenous Stopping Infusion hung by another clincian 04/18/20 0806)  cefTRIAXone (ROCEPHIN) 1 g in sodium chloride 0.9 % 100 mL IVPB (0 g Intravenous Stopped 04/17/20 1818)  potassium chloride 10 mEq in 100 mL IVPB (0 mEq Intravenous Stopped 04/18/20 0735)  magnesium sulfate IVPB 2 g 50 mL ( Intravenous Stopped 04/18/20 0905)  potassium chloride 10 mEq in 100 mL IVPB (10 mEq Intravenous New Bag/Given 04/18/20 1500)     Mobility walks High fall risk   Focused Assessments   R Recommendations: See Admitting Provider Note  Report given to:   Additional Notes:

## 2020-04-18 NOTE — ED Notes (Signed)
CRITICAL VALUE ALERT  Critical Value:  Potassium 2.3  Date & Time Notied:  04/18/20 0806  Provider Notified: Tat MD  Orders Received/Actions taken: n/a

## 2020-04-18 NOTE — Progress Notes (Signed)
Patient O2 sats mid 80s. Placed on [facility] and now sats are mid 90s.

## 2020-04-19 LAB — CBC WITH DIFFERENTIAL/PLATELET
Abs Immature Granulocytes: 0.09 10*3/uL — ABNORMAL HIGH (ref 0.00–0.07)
Basophils Absolute: 0 10*3/uL (ref 0.0–0.1)
Basophils Relative: 0 %
Eosinophils Absolute: 0 10*3/uL (ref 0.0–0.5)
Eosinophils Relative: 0 %
HCT: 29.6 % — ABNORMAL LOW (ref 36.0–46.0)
Hemoglobin: 10.2 g/dL — ABNORMAL LOW (ref 12.0–15.0)
Immature Granulocytes: 1 %
Lymphocytes Relative: 13 %
Lymphs Abs: 1.6 10*3/uL (ref 0.7–4.0)
MCH: 33.4 pg (ref 26.0–34.0)
MCHC: 34.5 g/dL (ref 30.0–36.0)
MCV: 97 fL (ref 80.0–100.0)
Monocytes Absolute: 0.6 10*3/uL (ref 0.1–1.0)
Monocytes Relative: 5 %
Neutro Abs: 10.4 10*3/uL — ABNORMAL HIGH (ref 1.7–7.7)
Neutrophils Relative %: 81 %
Platelets: 146 10*3/uL — ABNORMAL LOW (ref 150–400)
RBC: 3.05 MIL/uL — ABNORMAL LOW (ref 3.87–5.11)
RDW: 16.8 % — ABNORMAL HIGH (ref 11.5–15.5)
WBC: 12.8 10*3/uL — ABNORMAL HIGH (ref 4.0–10.5)
nRBC: 0 % (ref 0.0–0.2)

## 2020-04-19 LAB — MAGNESIUM: Magnesium: 1.6 mg/dL — ABNORMAL LOW (ref 1.7–2.4)

## 2020-04-19 LAB — COMPREHENSIVE METABOLIC PANEL
ALT: 63 U/L — ABNORMAL HIGH (ref 0–44)
AST: 134 U/L — ABNORMAL HIGH (ref 15–41)
Albumin: 1.9 g/dL — ABNORMAL LOW (ref 3.5–5.0)
Alkaline Phosphatase: 106 U/L (ref 38–126)
Anion gap: 8 (ref 5–15)
BUN: <5 mg/dL — ABNORMAL LOW (ref 6–20)
CO2: 25 mmol/L (ref 22–32)
Calcium: 7.1 mg/dL — ABNORMAL LOW (ref 8.9–10.3)
Chloride: 98 mmol/L (ref 98–111)
Creatinine, Ser: 0.49 mg/dL (ref 0.44–1.00)
GFR calc Af Amer: >60 mL/min (ref >60)
GFR calc non Af Amer: >60 mL/min (ref >60)
Glucose, Bld: 97 mg/dL (ref 70–99)
Potassium: 3.5 mmol/L (ref 3.5–5.1)
Sodium: 131 mmol/L — ABNORMAL LOW (ref 135–145)
Total Bilirubin: 1.3 mg/dL — ABNORMAL HIGH (ref 0.3–1.2)
Total Protein: 4.6 g/dL — ABNORMAL LOW (ref 6.5–8.1)

## 2020-04-19 LAB — MRSA PCR SCREENING: MRSA by PCR: NEGATIVE

## 2020-04-19 LAB — D-DIMER, QUANTITATIVE: D-Dimer, Quant: 6.2 ug/mL-FEU — ABNORMAL HIGH (ref 0.00–0.50)

## 2020-04-19 MED ORDER — MAGNESIUM SULFATE 2 GM/50ML IV SOLN
2.0000 g | Freq: Once | INTRAVENOUS | Status: AC
  Administered 2020-04-19: 2 g via INTRAVENOUS
  Filled 2020-04-19: qty 50

## 2020-04-19 NOTE — Progress Notes (Signed)
PROGRESS NOTE  Olivia Lester ENI:[id number] DOB: 11/26/1975 DOA: 04/17/2020 PCP: Patient, No Pcp Per  Brief History:  44 year old female with a history of polysubstance abuse including alcohol, cannabis, tobacco, pancreatic insufficiency, hepatitis C, anxiety/depression, chronic back pain, chronic pain syndrome, thrombocytopenia, hypertension, severe malnutrition presenting with 3-day history of nausea, vomiting with associated myalgias, arthralgias, and worsening abdominal pain.  The patient states that her symptoms began on 04/14/2020.  Unfortunately, she continues to drink 4 shots of liquor on a daily basis.  She previously drank 1/5 on a daily basis.  She states that her last drink was on the day of admission.  However, it was noted that the patient had a bottle of liquor in the emergency department which she was consuming given to her medical care.  The patient also complained of fevers up to 104.0 F on 04/16/2020.  She denies any hematemesis, hematochezia, melena.  She complains of some dysuria. In the emergency department, the patient was febrile up to 100.6 F with tachycardia.  She was hemodynamically stable.  Oxygen saturation was 95% on room air.  BMP with sodium 132, potassium 2.9, serum creatinine 0.46.  WBC 16.2 with hemoglobin 12.5 and platelets 172,000.  The patient was started on ceftriaxone and alcohol withdrawal protocol.   Assessment/Plan: Severe Sepsis -present on admission -due to UTI -continue IVF -lactic acid peaked 2.5 -continue ceftriaxone pending cultures   Pyelonephritis -UA>50 WBC -04/17/2020 CT abdomen-heterogeneous left renal enhancement with a striated nephrogram.  There was perinephric stranding on the left without fluid collection.  There is slight enhancement of the right upper kidney. -Continue ceftriaxone pending culture data  Acute metabolic encephalopathy -Secondary to sepsis and alcohol -Mental status improving on the morning of  04/19/2020  Intractable nausea and vomiting -In part due to alcoholic gastritis -full liquid diet per pt request -Start PPI -Continue IV fluids  Polysubstance abuse -Including tobacco, alcohol, and cannabis -Cessation discussed -nicoderm patch  Alcohol abuse -Alcohol withdrawal protocol  Essential hypertension -Holding amlodipine secondary to soft blood pressure initially  Hypokalemia/Hypomagnesemia -Replete -Check magnesium--1.0>>1.6  Chronic thrombocytopenia -from EtOH abuse and sepsis, monitor -am CBC  Loose stools -likely due to pancreatic insufficiency -check cdiff  Chest pain -likely due to vomiting -atypical by history -cycle troponins 51>>41 -personally reviewed CXR--no edema or consolidation -D-dimer    Status is: Inpatient  Remains inpatient appropriate because:Hemodynamically unstable, Unsafe d/c plan and IV treatments appropriate due to intensity of illness or inability to take PO   Dispo: The patient is from: Home  Anticipated d/c is to: Home  Anticipated d/c date is: 2 days  Patient currently is not medically stable to d/c.        Family Communication:   No Family at bedside  Consultants:  none  Code Status:  FULL   DVT Prophylaxis:  [place] Heparin   Procedures: As Listed in Progress Note Above  Antibiotics: Ceftriaxone 9/24>>>     Subjective: Patient complains of abd and chest pain.  She has some sob.  Denies hemoptysis.  She has dry cough.  Had one episode of emesis last night.  Denies diarrhea, hematochezia, melena  Objective: Vitals:   04/19/20 0700 04/19/20 0800 04/19/20 0813 04/19/20 0900  BP: (!) 148/88     Pulse: 88 95 94 99  Resp:      Temp:   98 F (36.7 C)   TempSrc:   Oral   SpO2: 95% 95% 97% 96%  Weight:  Height:        Intake/Output Summary (Last 24 hours) at 04/19/2020 0952 Last data filed at 04/19/2020 0900 Gross per 24 hour  Intake  3562.42 ml  Output --  Net 3562.42 ml   Weight change: 13.2 kg Exam:   General:  Pt is alert, follows commands appropriately, not in acute distress  HEENT: No icterus, No thrush, No neck mass, [place]/AT  Cardiovascular: RRR, S1/S2, no rubs, no gallops  Respiratory: fine bibasilar rales. No wheeze  Abdomen: Soft/+BS, upper abd bilateral tender, non distended, no guarding  Extremities: No edema, No lymphangitis, No petechiae, No rashes, no synovitis   Data Reviewed: I have personally reviewed following labs and imaging studies Basic Metabolic Panel: Recent Labs  Lab 04/17/20 1453 04/18/20 0604 04/19/20 0638  NA 132* 130* 131*  K 2.9* 2.3* 3.5  CL 90* 90* 98  CO2 26 28 25   GLUCOSE 77 112* 97  BUN 5* <5* <5*  CREATININE 0.46 0.47 0.49  CALCIUM 7.6* 7.0* 7.1*  MG  --  1.0* 1.6*   Liver Function Tests: Recent Labs  Lab 04/17/20 1453 04/18/20 0604 04/19/20 0638  AST 58* 99* 134*  ALT 55* 48* 63*  ALKPHOS 148* 121 106  BILITOT 1.3* 1.1 1.3*  PROT 5.9* 4.8* 4.6*  ALBUMIN 2.5* 1.9* 1.9*   Recent Labs  Lab 04/17/20 1453  LIPASE 16   Recent Labs  Lab 04/17/20 1453  AMMONIA 30   Coagulation Profile: Recent Labs  Lab 04/17/20 1922  INR 1.2   CBC: Recent Labs  Lab 04/17/20 1400 04/18/20 0604 04/19/20 0638  WBC 16.2* 17.3* 12.8*  NEUTROABS 13.5*  --  10.4*  HGB 12.5 10.4* 10.2*  HCT 37.4 29.6* 29.6*  MCV 98.4 94.9 97.0  PLT 191 172 146*   Cardiac Enzymes: No results for input(s): CKTOTAL, CKMB, CKMBINDEX, TROPONINI in the last 168 hours. BNP: Invalid input(s): POCBNP CBG: No results for input(s): GLUCAP in the last 168 hours. HbA1C: No results for input(s): HGBA1C in the last 72 hours. Urine analysis:    Component Value Date/Time   COLORURINE YELLOW 04/17/2020 1400   APPEARANCEUR HAZY (A) 04/17/2020 1400   APPEARANCEUR Clear 12/07/2013 0133   LABSPEC 1.008 04/17/2020 1400   LABSPEC 1.006 12/07/2013 0133   PHURINE 7.0 04/17/2020 1400    GLUCOSEU NEGATIVE 04/17/2020 1400   GLUCOSEU Negative 12/07/2013 0133   HGBUR MODERATE (A) 04/17/2020 1400   BILIRUBINUR NEGATIVE 04/17/2020 1400   BILIRUBINUR Negative 12/07/2013 0133   KETONESUR NEGATIVE 04/17/2020 1400   PROTEINUR 30 (A) 04/17/2020 1400   UROBILINOGEN 0.2 04/28/2015 1559   NITRITE POSITIVE (A) 04/17/2020 1400   LEUKOCYTESUR LARGE (A) 04/17/2020 1400   LEUKOCYTESUR Negative 12/07/2013 0133   Sepsis Labs: @LABRCNTIP (procalcitonin:4,lacticidven:4) ) Recent Results (from the past 240 hour(s))  Resp Panel by RT PCR (RSV, Flu A&B, Covid) - Nasopharyngeal Swab     Status: None   Collection Time: 04/17/20  1:15 PM   Specimen: Nasopharyngeal Swab  Result Value Ref Range Status   SARS Coronavirus 2 by RT PCR NEGATIVE NEGATIVE Final    Comment: (NOTE) SARS-CoV-2 target nucleic acids are NOT DETECTED.  The SARS-CoV-2 RNA is generally detectable in upper respiratoy specimens during the acute phase of infection. The lowest concentration of SARS-CoV-2 viral copies this assay can detect is 131 copies/mL. A negative result does not preclude SARS-Cov-2 infection and should not be used as the sole basis for treatment or other patient management decisions. A negative result may occur with  improper specimen collection/handling, submission of specimen other than nasopharyngeal swab, presence of viral mutation(s) within the areas targeted by this assay, and inadequate number of viral copies (<131 copies/mL). A negative result must be combined with clinical observations, patient history, and epidemiological information. The expected result is Negative.  Fact Sheet for Patients:  PinkCheek.be  Fact Sheet for Healthcare Providers:  GravelBags.it  This test is no t yet approved or cleared by the [place] FDA and  has been authorized for detection and/or diagnosis of SARS-CoV-2 by FDA under an Emergency Use  Authorization (EUA). This EUA will remain  in effect (meaning this test can be used) for the duration of the COVID-19 declaration under Section 564(b)(1) of the Act, 21 U.S.C. section 360bbb-3(b)(1), unless the authorization is terminated or revoked sooner.     Influenza A by PCR NEGATIVE NEGATIVE Final   Influenza B by PCR NEGATIVE NEGATIVE Final    Comment: (NOTE) The Xpert Xpress SARS-CoV-2/FLU/RSV assay is intended as an aid in  the diagnosis of influenza from Nasopharyngeal swab specimens and  should not be used as a sole basis for treatment. Nasal washings and  aspirates are unacceptable for Xpert Xpress SARS-CoV-2/FLU/RSV  testing.  Fact Sheet for Patients: PinkCheek.be  Fact Sheet for Healthcare Providers: GravelBags.it  This test is not yet approved or cleared by the [place] FDA and  has been authorized for detection and/or diagnosis of SARS-CoV-2 by  FDA under an Emergency Use Authorization (EUA). This EUA will remain  in effect (meaning this test can be used) for the duration of the  Covid-19 declaration under Section 564(b)(1) of the Act, 21  U.S.C. section 360bbb-3(b)(1), unless the authorization is  terminated or revoked.    Respiratory Syncytial Virus by PCR NEGATIVE NEGATIVE Final    Comment: (NOTE) Fact Sheet for Patients: PinkCheek.be  Fact Sheet for Healthcare Providers: GravelBags.it  This test is not yet approved or cleared by the [place] FDA and  has been authorized for detection and/or diagnosis of SARS-CoV-2 by  FDA under an Emergency Use Authorization (EUA). This EUA will remain  in effect (meaning this test can be used) for the duration of the  COVID-19 declaration under Section 564(b)(1) of the Act, 21 U.S.C.  section 360bbb-3(b)(1), unless the authorization is terminated or  revoked. Performed at [facility], [address], [place], [place] [address]   Blood Culture (routine x 2)     Status: None (Preliminary result)   Collection Time: 04/17/20  4:11 PM   Specimen: Peripheral; Blood  Result Value Ref Range Status   Specimen Description BLOOD RIGHT HAND  Final   Special Requests   Final    BOTTLES DRAWN AEROBIC ONLY Blood Culture results may not be optimal due to an inadequate volume of blood received in culture bottles   Culture   Final    NO GROWTH < 24 HOURS Performed at [facility], [address] [place], [place] [address]    Report Status PENDING  Incomplete  Blood Culture (routine x 2)     Status: None (Preliminary result)   Collection Time: 04/17/20  7:22 PM   Specimen: Peripheral; Blood  Result Value Ref Range Status   Specimen Description LEFT ANTECUBITAL  Final   Special Requests   Final    BOTTLES DRAWN AEROBIC ONLY Blood Culture results may not be optimal due to an inadequate volume of blood received in culture bottles   Culture   Final    NO GROWTH <  12 HOURS Performed at [facility], [address] [place], [place] [address]    Report Status PENDING  Incomplete  MRSA PCR Screening     Status: None   Collection Time: 04/18/20  5:26 PM   Specimen: Nasal Mucosa; Nasopharyngeal  Result Value Ref Range Status   MRSA by PCR NEGATIVE NEGATIVE Final    Comment:        The GeneXpert MRSA Assay (FDA approved for NASAL specimens only), is one component of a comprehensive MRSA colonization surveillance program. It is not intended to diagnose MRSA infection nor to guide or monitor treatment for MRSA infections. Performed at [facility], [address] [place], [place] [address]      Scheduled Meds: . Chlorhexidine Gluconate Cloth  6 each Topical Daily  . folic acid  1 mg Oral Daily  . heparin  5,000 Units Subcutaneous Q8H  . LORazepam  0-4 mg Intravenous Q4H   Followed by  . [START ON 04/20/2020] LORazepam  0-4 mg Intravenous Q8H  . multivitamin with minerals  1 tablet Oral  Daily  . nicotine  21 mg Transdermal Daily  . pantoprazole  40 mg Oral BID AC  . thiamine  100 mg Oral Daily   Continuous Infusions: . 0.9 % NaCl with KCl 40 mEq / L 125 mL/hr at 04/19/20 0639  . cefTRIAXone (ROCEPHIN)  IV 1 g (04/18/20 1801)    Procedures/Studies: CT ABDOMEN PELVIS W CONTRAST  Result Date: 04/17/2020 CLINICAL DATA:  Acute nonlocalized abdominal pain. Fever and malaise. Abdominal swelling. EXAM: CT ABDOMEN AND PELVIS WITH CONTRAST TECHNIQUE: Multidetector CT imaging of the abdomen and pelvis was performed using the standard protocol following bolus administration of intravenous contrast. CONTRAST:  162mL OMNIPAQUE IOHEXOL 300 MG/ML  SOLN COMPARISON:  Most recent CT 01/26/2020 FINDINGS: Lower chest: No pleural fluid or focal airspace disease. The heart is normal in size. Hepatobiliary: Again seen advanced hepatic steatosis. Mildly enlarged liver spanning 21 cm cranial caudal. No evidence of focal hepatic lesion. Patent portal vein. Pancreas: Ill-defined fat planes adjacent to the pancreatic head. No acute peripancreatic collection. No ductal dilatation. Spleen: Normal in size without focal abnormality. Adrenals/Urinary Tract: No adrenal nodule. Diffusely heterogeneous left renal enhancement with striated nephrogram in areas of diminished perfusion involving the upper, mid lower kidney. There is left perinephric edema. No evidence of perirenal fluid collection. No renal or ureteral calculi. Slight heterogeneous enhancement of the upper right kidney. Minimal adjacent perinephric edema. The urinary bladder is physiologically distended without wall thickening. Stomach/Bowel: Bowel evaluation is limited in the absence of enteric contrast as well as mild motion artifact. Decompressed stomach. Normal positioning of the duodenum and ligament of Treitz. There is no small bowel obstruction or inflammatory change. Normal appendix. Small volume of colonic stool. There is no colonic wall thickening  or inflammation. Vascular/Lymphatic: Moderate aorto bi-iliac atherosclerosis, advanced for age. No aortic aneurysm. The portal vein is patent. There multiple prominent retroperitoneal lymph nodes are borderline enlarged. Prominent central mesenteric nodes. The renal arteries appear patent. Reproductive: Free fluid adjacent to the uterus which is otherwise unremarkable. 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 Other: Small amount of free fluid in the pelvis. There is no upper abdominal ascites. No abdominal wall hernia. There is generalized body wall edema. Musculoskeletal: Posterior lumbar fusion with intact hardware. There are no acute or suspicious osseous abnormalities. IMPRESSION: 1. Heterogeneous left renal enhancement with striated nephrogram and areas of diminished perfusion involving the upper, mid lower kidney, consistent with pyelonephritis. No perirenal fluid  collection. Findings also suspicious for pyelonephritis of the upper pole of the right kidney. 2. Ill-defined fat planes adjacent to the pancreatic head, may represent acute pancreatitis. Recommend correlation with pancreatic enzymes. 3. Advanced hepatic steatosis and mild hepatomegaly. 4. 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 5. Small amount of free fluid in the pelvis. Generalized body wall edema. 6. Aortic atherosclerosis is advanced for age. Aortic Atherosclerosis (ICD10-I70.0). Electronically Signed   By: Keith Rake M.D.   On: 04/17/2020 19:03   DG Chest Portable 1 View  Result Date: 04/17/2020 CLINICAL DATA:  Cough EXAM: PORTABLE CHEST 1 VIEW COMPARISON:  09/04/2019 chest radiograph and prior. FINDINGS: The heart size and mediastinal contours are within normal limits. Both lungs are clear. No pneumothorax or pleural effusion. No acute osseous abnormality. IMPRESSION: No focal airspace disease. Electronically Signed   By: Primitivo Gauze M.D.   On: 04/17/2020 14:52    Orson Eva, DO  [facility]  If 7PM-7AM, please contact  night-coverage [email or web address] Password [email or web address] 04/19/2020, 9:52 AM   LOS: 2 days

## 2020-04-20 ENCOUNTER — Inpatient Hospital Stay (HOSPITAL_COMMUNITY): Payer: Self-pay

## 2020-04-20 DIAGNOSIS — A4151 Sepsis due to Escherichia coli [E. coli]: Principal | ICD-10-CM

## 2020-04-20 LAB — CBC
HCT: 35.4 % — ABNORMAL LOW (ref 36.0–46.0)
Hemoglobin: 11.7 g/dL — ABNORMAL LOW (ref 12.0–15.0)
MCH: 32.6 pg (ref 26.0–34.0)
MCHC: 33.1 g/dL (ref 30.0–36.0)
MCV: 98.6 fL (ref 80.0–100.0)
Platelets: 168 10*3/uL (ref 150–400)
RBC: 3.59 MIL/uL — ABNORMAL LOW (ref 3.87–5.11)
RDW: 16.9 % — ABNORMAL HIGH (ref 11.5–15.5)
WBC: 13.7 10*3/uL — ABNORMAL HIGH (ref 4.0–10.5)
nRBC: 0 % (ref 0.0–0.2)

## 2020-04-20 LAB — COMPREHENSIVE METABOLIC PANEL
ALT: 69 U/L — ABNORMAL HIGH (ref 0–44)
AST: 122 U/L — ABNORMAL HIGH (ref 15–41)
Albumin: 2.2 g/dL — ABNORMAL LOW (ref 3.5–5.0)
Alkaline Phosphatase: 117 U/L (ref 38–126)
Anion gap: 9 (ref 5–15)
BUN: <5 mg/dL — ABNORMAL LOW (ref 6–20)
CO2: 23 mmol/L (ref 22–32)
Calcium: 8 mg/dL — ABNORMAL LOW (ref 8.9–10.3)
Chloride: 103 mmol/L (ref 98–111)
Creatinine, Ser: 0.46 mg/dL (ref 0.44–1.00)
GFR calc Af Amer: >60 mL/min (ref >60)
GFR calc non Af Amer: >60 mL/min (ref >60)
Glucose, Bld: 79 mg/dL (ref 70–99)
Potassium: 4.7 mmol/L (ref 3.5–5.1)
Sodium: 135 mmol/L (ref 135–145)
Total Bilirubin: 1.7 mg/dL — ABNORMAL HIGH (ref 0.3–1.2)
Total Protein: 5.5 g/dL — ABNORMAL LOW (ref 6.5–8.1)

## 2020-04-20 LAB — URINE CULTURE: Culture: >=100000 — AB

## 2020-04-20 LAB — MAGNESIUM: Magnesium: 1.9 mg/dL (ref 1.7–2.4)

## 2020-04-20 MED ORDER — ONDANSETRON HCL 4 MG/2ML IJ SOLN
4.0000 mg | Freq: Four times a day (QID) | INTRAMUSCULAR | Status: DC
  Administered 2020-04-20 – 2020-04-22 (×10): 4 mg via INTRAVENOUS
  Filled 2020-04-20 (×12): qty 2

## 2020-04-20 MED ORDER — LACTATED RINGERS IV SOLN: INTRAVENOUS | Status: DC

## 2020-04-20 NOTE — Progress Notes (Signed)
Denies vomiting since this morning at 0600,  Gave zofran and she had broth for breakfast.  C/O back pain and received oxycodone.  Has been mostly sleeping but ambulates to BR.

## 2020-04-20 NOTE — Progress Notes (Signed)
   04/20/20 1844  Assess: MEWS Score  Temp (!) 102.8 F (39.3 C)  BP (!) 150/108  Pulse Rate 96  Resp 16  SpO2 97 %  O2 Device Room Air  Assess: MEWS Score  MEWS Temp 2  MEWS Systolic 0  MEWS Pulse 0  MEWS RR 0  MEWS LOC 0  MEWS Score 2  MEWS Score Color Yellow  Assess: if the MEWS score is Yellow or Red  Were vital signs taken at a resting state? Yes  Focused Assessment No change from prior assessment  Early Detection of Sepsis Score *See Row Information* High  MEWS guidelines implemented *See Row Information* Yes  Treat  MEWS Interventions Administered scheduled meds/treatments  Pain Scale 0-10  Pain Score 8  Pain Type Acute pain  Pain Location Back  Pain Orientation Mid  Pain Onset On-going  Pain Intervention(s) Medication (See eMAR)  Breathing 0  Take Vital Signs  Increase Vital Sign Frequency  Yellow: Q 2hr X 2 then Q 4hr X 2, if remains yellow, continue Q 4hrs  Escalate  MEWS: Escalate Yellow: discuss with charge nurse/RN and consider discussing with provider and RRT  Notify: Charge Nurse/RN  Name of Charge Nurse/RN Notified Lattie Haw  Date Charge Nurse/RN Notified 04/20/20  Time Charge Nurse/RN Notified 1912

## 2020-04-20 NOTE — Progress Notes (Signed)
Multiple IV attempts for 20 gauge.  Supervisor will check with night shift ED staff for someone to start.

## 2020-04-20 NOTE — Progress Notes (Signed)
PROGRESS NOTE  Olivia Lester LGX:[id number] DOB: [birth year] DOA: 04/17/2020 PCP: Patient, No Pcp Per  Brief History: 44 year old female with a history of polysubstance abuse including alcohol, cannabis, tobacco, pancreatic insufficiency, hepatitis C, anxiety/depression, chronic back pain, chronic pain syndrome, thrombocytopenia, hypertension, severe malnutrition presenting with 3-day history of nausea, vomiting with associated myalgias, arthralgias, and worsening abdominal pain. The patient states that her symptoms began on 04/14/2020. Unfortunately, she continues to drink 4 shots of liquor on a daily basis. She previously drank 1/5 on a daily basis. She states that her last drink was on the day of admission. However, it was noted that the patient had a bottle of liquor in the emergency department which she was consuming given to her medical care. The patient also complained of fevers up to 104.0 F on 04/16/2020. She denies any hematemesis, hematochezia, melena. She complains of some dysuria. In the emergency department, the patient was febrile up to 100.6 F with tachycardia. She was hemodynamically stable. Oxygen saturation was 95% on room air. BMP with sodium 132, potassium 2.9, serum creatinine 0.46. WBC 16.2 with hemoglobin 12.5 and platelets 172,000. The patient was started on ceftriaxone and alcohol withdrawal protocol.   Assessment/Plan: Severe Sepsis -present on admission -due to UTI -continue IVF -lactic acid peaked 2.5 -continue ceftriaxone  -continues to have low grade fevers  Pyelonephritis--EColi -UA>50 WBC -04/17/2020 CT abdomen-heterogeneous left renal enhancement with a striated nephrogram. There was perinephric stranding on the left without fluid collection. There is slight enhancement of the right upper kidney. -Continue ceftriaxone -am CBC  Acute metabolic encephalopathy -Secondary to sepsis and alcohol -Mental status improving on the morning of  04/19/2020  Intractable nausea and vomiting -In part due to alcoholic gastritis and cannabis hyperemesis syndrome -claims to continued vomiting but has potato chips, ice cream and sandwich at bedside. -down grade to npo except ice chips -continue PPI -zofran q 6 hours ATC -Continue IV fluids  Polysubstance abuse -Including tobacco, alcohol, and cannabis -Cessation discussed -nicoderm patch  Alcohol abuse -Alcohol withdrawal protocol  Essential hypertension -Holding amlodipine secondary to soft blood pressure initially  Hypokalemia/Hypomagnesemia -Replete -Check magnesium--1.0>>1.6>>1.9  Chronic thrombocytopenia -from EtOH abuseand sepsis, monitor -am CBC  Loose stools -likely due to pancreatic insufficiency -check cdiff--no more diarrhea  Chest pain -likely due to vomiting -atypical by history -cycle troponins 51>>41 -personally reviewed CXR--no edema or consolidation -D-dimer-6.20 -personally reviewed EKG--sinus, no STT changes -CTA chest   Status is: Inpatient  Remains inpatient appropriate because:Hemodynamically unstable, Unsafe d/c plan and IV treatments appropriate due to intensity of illness or inability to take PO   Dispo: The patient is from:Home Anticipated d/c is CX:KGYJ Anticipated d/c date is: 2 days Patient currently is not medically stable to d/c.        Family Communication:NoFamily at bedside  Consultants:none  Code Status: FULL   DVT Prophylaxis: [place] Heparin   Procedures: As Listed in Progress Note Above  Antibiotics: Ceftriaxone 9/24>>>     Subjective: Patient continues to complain of cp, abd pain.  States abd pain is about 10-25% better.  Denies diarrhea but continues to complain of n/v without blood.    Objective: Vitals:   04/20/20 0204 04/20/20 0500 04/20/20 0544 04/20/20 1151  BP: (!) 148/99  (!) 152/100 (!) 141/108  Pulse: 82  (!) 101  81  Resp: 18  18 16   Temp:   99.2 F (37.3 C) 98.4 F (36.9 C)  TempSrc:   Oral Oral  SpO2: 99%  97% 96%  Weight:  66.1 kg    Height:       No intake or output data in the 24 hours ending 04/20/20 1650 Weight change: 3 kg Exam:   General:  Pt is alert, follows commands appropriately, not in acute distress  HEENT: No icterus, No thrush, No neck mass, [place]/AT  Cardiovascular: RRR, S1/S2, no rubs, no gallops  Respiratory: bibasilar rales. No wheeze  Abdomen: Soft/+BS, diffusely tender, non distended, no guarding  Extremities: No edema, No lymphangitis, No petechiae, No rashes, no synovitis   Data Reviewed: I have personally reviewed following labs and imaging studies Basic Metabolic Panel: Recent Labs  Lab 04/17/20 1453 04/18/20 0604 04/19/20 0638 04/20/20 0606  NA 132* 130* 131* 135  K 2.9* 2.3* 3.5 4.7  CL 90* 90* 98 103  CO2 26 28 25 23   GLUCOSE 77 112* 97 79  BUN 5* <5* <5* <5*  CREATININE 0.46 0.47 0.49 0.46  CALCIUM 7.6* 7.0* 7.1* 8.0*  MG  --  1.0* 1.6* 1.9   Liver Function Tests: Recent Labs  Lab 04/17/20 1453 04/18/20 0604 04/19/20 0638 04/20/20 0606  AST 58* 99* 134* 122*  ALT 55* 48* 63* 69*  ALKPHOS 148* 121 106 117  BILITOT 1.3* 1.1 1.3* 1.7*  PROT 5.9* 4.8* 4.6* 5.5*  ALBUMIN 2.5* 1.9* 1.9* 2.2*   Recent Labs  Lab 04/17/20 1453  LIPASE 16   Recent Labs  Lab 04/17/20 1453  AMMONIA 30   Coagulation Profile: Recent Labs  Lab 04/17/20 1922  INR 1.2   CBC: Recent Labs  Lab 04/17/20 1400 04/18/20 0604 04/19/20 0638 04/20/20 0606  WBC 16.2* 17.3* 12.8* 13.7*  NEUTROABS 13.5*  --  10.4*  --   HGB 12.5 10.4* 10.2* 11.7*  HCT 37.4 29.6* 29.6* 35.4*  MCV 98.4 94.9 97.0 98.6  PLT 191 172 146* 168   Cardiac Enzymes: No results for input(s): CKTOTAL, CKMB, CKMBINDEX, TROPONINI in the last 168 hours. BNP: Invalid input(s): POCBNP CBG: No results for input(s): GLUCAP in the last 168 hours. HbA1C: No results for input(s):  HGBA1C in the last 72 hours. Urine analysis:    Component Value Date/Time   COLORURINE YELLOW 04/17/2020 1400   APPEARANCEUR HAZY (A) 04/17/2020 1400   APPEARANCEUR Clear 12/07/2013 0133   LABSPEC 1.008 04/17/2020 1400   LABSPEC 1.006 12/07/2013 0133   PHURINE 7.0 04/17/2020 1400   GLUCOSEU NEGATIVE 04/17/2020 1400   GLUCOSEU Negative 12/07/2013 0133   HGBUR MODERATE (A) 04/17/2020 1400   BILIRUBINUR NEGATIVE 04/17/2020 1400   BILIRUBINUR Negative 12/07/2013 0133   KETONESUR NEGATIVE 04/17/2020 1400   PROTEINUR 30 (A) 04/17/2020 1400   UROBILINOGEN 0.2 04/28/2015 1559   NITRITE POSITIVE (A) 04/17/2020 1400   LEUKOCYTESUR LARGE (A) 04/17/2020 1400   LEUKOCYTESUR Negative 12/07/2013 0133   Sepsis Labs: @LABRCNTIP (procalcitonin:4,lacticidven:4) ) Recent Results (from the past 240 hour(s))  Resp Panel by RT PCR (RSV, Flu A&B, Covid) - Nasopharyngeal Swab     Status: None   Collection Time: 04/17/20  1:15 PM   Specimen: Nasopharyngeal Swab  Result Value Ref Range Status   SARS Coronavirus 2 by RT PCR NEGATIVE NEGATIVE Final    Comment: (NOTE) SARS-CoV-2 target nucleic acids are NOT DETECTED.  The SARS-CoV-2 RNA is generally detectable in upper respiratoy specimens during the acute phase of infection. The lowest concentration of SARS-CoV-2 viral copies this assay can detect is 131 copies/mL. A negative result does not preclude SARS-Cov-2 infection and should not be used  as the sole basis for treatment or other patient management decisions. A negative result may occur with  improper specimen collection/handling, submission of specimen other than nasopharyngeal swab, presence of viral mutation(s) within the areas targeted by this assay, and inadequate number of viral copies (<131 copies/mL). A negative result must be combined with clinical observations, patient history, and epidemiological information. The expected result is Negative.  Fact Sheet for Patients:    PinkCheek.be  Fact Sheet for Healthcare Providers:  GravelBags.it  This test is no t yet approved or cleared by the [place] FDA and  has been authorized for detection and/or diagnosis of SARS-CoV-2 by FDA under an Emergency Use Authorization (EUA). This EUA will remain  in effect (meaning this test can be used) for the duration of the COVID-19 declaration under Section 564(b)(1) of the Act, 21 U.S.C. section 360bbb-3(b)(1), unless the authorization is terminated or revoked sooner.     Influenza A by PCR NEGATIVE NEGATIVE Final   Influenza B by PCR NEGATIVE NEGATIVE Final    Comment: (NOTE) The Xpert Xpress SARS-CoV-2/FLU/RSV assay is intended as an aid in  the diagnosis of influenza from Nasopharyngeal swab specimens and  should not be used as a sole basis for treatment. Nasal washings and  aspirates are unacceptable for Xpert Xpress SARS-CoV-2/FLU/RSV  testing.  Fact Sheet for Patients: PinkCheek.be  Fact Sheet for Healthcare Providers: GravelBags.it  This test is not yet approved or cleared by the [place] FDA and  has been authorized for detection and/or diagnosis of SARS-CoV-2 by  FDA under an Emergency Use Authorization (EUA). This EUA will remain  in effect (meaning this test can be used) for the duration of the  Covid-19 declaration under Section 564(b)(1) of the Act, 21  U.S.C. section 360bbb-3(b)(1), unless the authorization is  terminated or revoked.    Respiratory Syncytial Virus by PCR NEGATIVE NEGATIVE Final    Comment: (NOTE) Fact Sheet for Patients: PinkCheek.be  Fact Sheet for Healthcare Providers: GravelBags.it  This test is not yet approved or cleared by the [place] FDA and  has been authorized for detection and/or diagnosis of SARS-CoV-2 by  FDA under an Emergency  Use Authorization (EUA). This EUA will remain  in effect (meaning this test can be used) for the duration of the  COVID-19 declaration under Section 564(b)(1) of the Act, 21 U.S.C.  section 360bbb-3(b)(1), unless the authorization is terminated or  revoked. Performed at [facility], [address] [place], [place] [address]   Urine culture     Status: Abnormal   Collection Time: 04/17/20  3:57 PM   Specimen: In/Out Cath Urine  Result Value Ref Range Status   Specimen Description   Final    IN/OUT CATH URINE Performed at [facility], [address], [place], [place] [address]    Special Requests   Final    NONE Performed at [facility], [address] [place] [address], [place],  [address]    Culture >=100,000 COLONIES/mL ESCHERICHIA COLI (A)  Final   Report Status 04/20/2020 FINAL  Final   Organism ID, Bacteria ESCHERICHIA COLI (A)  Final      Susceptibility   Escherichia coli - MIC*    AMPICILLIN <=2 SENSITIVE Sensitive     CEFAZOLIN <=4 SENSITIVE Sensitive     CEFTRIAXONE <=0.25 SENSITIVE Sensitive     CIPROFLOXACIN <=0.25 SENSITIVE Sensitive     GENTAMICIN <=1 SENSITIVE Sensitive     IMIPENEM <=0.25 SENSITIVE Sensitive     NITROFURANTOIN <=16 SENSITIVE Sensitive  TRIMETH/SULFA <=20 SENSITIVE Sensitive     AMPICILLIN/SULBACTAM <=2 SENSITIVE Sensitive     PIP/TAZO <=4 SENSITIVE Sensitive     * >=100,000 COLONIES/mL ESCHERICHIA COLI  Blood Culture (routine x 2)     Status: None (Preliminary result)   Collection Time: 04/17/20  4:11 PM   Specimen: BLOOD RIGHT HAND  Result Value Ref Range Status   Specimen Description BLOOD RIGHT HAND  Final   Special Requests   Final    BOTTLES DRAWN AEROBIC ONLY Blood Culture results may not be optimal due to an inadequate volume of blood received in culture bottles   Culture   Final    NO GROWTH 3 DAYS Performed at [facility], [address] [place], [place] [address]    Report Status PENDING  Incomplete  Blood Culture (routine x 2)      Status: None (Preliminary result)   Collection Time: 04/17/20  7:22 PM   Specimen: Left Antecubital; Blood  Result Value Ref Range Status   Specimen Description LEFT ANTECUBITAL  Final   Special Requests   Final    BOTTLES DRAWN AEROBIC ONLY Blood Culture results may not be optimal due to an inadequate volume of blood received in culture bottles   Culture   Final    NO GROWTH 3 DAYS Performed at [facility], [address], [place], [place] [address]    Report Status PENDING  Incomplete  MRSA PCR Screening     Status: None   Collection Time: 04/18/20  5:26 PM   Specimen: Nasal Mucosa; Nasopharyngeal  Result Value Ref Range Status   MRSA by PCR NEGATIVE NEGATIVE Final    Comment:        The GeneXpert MRSA Assay (FDA approved for NASAL specimens only), is one component of a comprehensive MRSA colonization surveillance program. It is not intended to diagnose MRSA infection nor to guide or monitor treatment for MRSA infections. Performed at [facility], [address], [place],  [address]      Scheduled Meds:  Chlorhexidine Gluconate Cloth  6 each Topical Daily   folic acid  1 mg Oral Daily   heparin  5,000 Units Subcutaneous Q8H   LORazepam  0-4 mg Intravenous Q8H   multivitamin with minerals  1 tablet Oral Daily   nicotine  21 mg Transdermal Daily   ondansetron (ZOFRAN) IV  4 mg Intravenous Q6H   pantoprazole  40 mg Oral BID AC   thiamine  100 mg Oral Daily   Continuous Infusions:  cefTRIAXone (ROCEPHIN)  IV 1 g (04/19/20 1812)   lactated ringers      Procedures/Studies: CT ABDOMEN PELVIS W CONTRAST  Result Date: 04/17/2020 CLINICAL DATA:  Acute nonlocalized abdominal pain. Fever and malaise. Abdominal swelling. EXAM: CT ABDOMEN AND PELVIS WITH CONTRAST TECHNIQUE: Multidetector CT imaging of the abdomen and pelvis was performed using the standard protocol following bolus administration of intravenous contrast. CONTRAST:  111mL OMNIPAQUE IOHEXOL 300  MG/ML  SOLN COMPARISON:  Most recent CT 01/26/2020 FINDINGS: Lower chest: No pleural fluid or focal airspace disease. The heart is normal in size. Hepatobiliary: Again seen advanced hepatic steatosis. Mildly enlarged liver spanning 21 cm cranial caudal. No evidence of focal hepatic lesion. Patent portal vein. Pancreas: Ill-defined fat planes adjacent to the pancreatic head. No acute peripancreatic collection. No ductal dilatation. Spleen: Normal in size without focal abnormality. Adrenals/Urinary Tract: No adrenal nodule. Diffusely heterogeneous left renal enhancement with striated nephrogram in areas of diminished perfusion involving the upper, mid lower kidney.  There is left perinephric edema. No evidence of perirenal fluid collection. No renal or ureteral calculi. Slight heterogeneous enhancement of the upper right kidney. Minimal adjacent perinephric edema. The urinary bladder is physiologically distended without wall thickening. Stomach/Bowel: Bowel evaluation is limited in the absence of enteric contrast as well as mild motion artifact. Decompressed stomach. Normal positioning of the duodenum and ligament of Treitz. There is no small bowel obstruction or inflammatory change. Normal appendix. Small volume of colonic stool. There is no colonic wall thickening or inflammation. Vascular/Lymphatic: Moderate aorto bi-iliac atherosclerosis, advanced for age. No aortic aneurysm. The portal vein is patent. There multiple prominent retroperitoneal lymph nodes are borderline enlarged. Prominent central mesenteric nodes. The renal arteries appear patent. Reproductive: Free fluid adjacent to the uterus which is otherwise unremarkable. 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 Other: Small amount of free fluid in the pelvis. There is no upper abdominal ascites. No abdominal wall hernia. There is generalized body wall edema. Musculoskeletal: Posterior lumbar fusion with intact hardware. There are no acute or suspicious osseous  abnormalities. IMPRESSION: 1. Heterogeneous left renal enhancement with striated nephrogram and areas of diminished perfusion involving the upper, mid lower kidney, consistent with pyelonephritis. No perirenal fluid collection. Findings also suspicious for pyelonephritis of the upper pole of the right kidney. 2. Ill-defined fat planes adjacent to the pancreatic head, may represent acute pancreatitis. Recommend correlation with pancreatic enzymes. 3. Advanced hepatic steatosis and mild hepatomegaly. 4. 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 5. Small amount of free fluid in the pelvis. Generalized body wall edema. 6. Aortic atherosclerosis is advanced for age. Aortic Atherosclerosis (ICD10-I70.0). Electronically Signed   By: Keith Rake M.D.   On: 04/17/2020 19:03   DG Chest Portable 1 View  Result Date: 04/17/2020 CLINICAL DATA:  Cough EXAM: PORTABLE CHEST 1 VIEW COMPARISON:  09/04/2019 chest radiograph and prior. FINDINGS: The heart size and mediastinal contours are within normal limits. Both lungs are clear. No pneumothorax or pleural effusion. No acute osseous abnormality. IMPRESSION: No focal airspace disease. Electronically Signed   By: Primitivo Gauze M.D.   On: 04/17/2020 14:52    Orson Eva, DO  [facility]  If 7PM-7AM, please contact night-coverage [email or web address] Password [email or web address] 04/20/2020, 4:50 PM   LOS: 3 days

## 2020-04-20 NOTE — Progress Notes (Signed)
Midlevel notified of staff multiple unsuccessful attempts of IV 20 gauge.

## 2020-04-21 ENCOUNTER — Inpatient Hospital Stay (HOSPITAL_COMMUNITY): Payer: Self-pay

## 2020-04-21 ENCOUNTER — Inpatient Hospital Stay: Payer: Self-pay

## 2020-04-21 LAB — CBC WITH DIFFERENTIAL/PLATELET
Abs Immature Granulocytes: 0.54 10*3/uL — ABNORMAL HIGH (ref 0.00–0.07)
Basophils Absolute: 0.1 10*3/uL (ref 0.0–0.1)
Basophils Relative: 1 %
Eosinophils Absolute: 0.1 10*3/uL (ref 0.0–0.5)
Eosinophils Relative: 1 %
HCT: 37.1 % (ref 36.0–46.0)
Hemoglobin: 12.2 g/dL (ref 12.0–15.0)
Immature Granulocytes: 4 %
Lymphocytes Relative: 17 %
Lymphs Abs: 2.7 10*3/uL (ref 0.7–4.0)
MCH: 32.4 pg (ref 26.0–34.0)
MCHC: 32.9 g/dL (ref 30.0–36.0)
MCV: 98.7 fL (ref 80.0–100.0)
Monocytes Absolute: 1.1 10*3/uL — ABNORMAL HIGH (ref 0.1–1.0)
Monocytes Relative: 7 %
Neutro Abs: 11.1 10*3/uL — ABNORMAL HIGH (ref 1.7–7.7)
Neutrophils Relative %: 70 %
Platelets: 215 10*3/uL (ref 150–400)
RBC: 3.76 MIL/uL — ABNORMAL LOW (ref 3.87–5.11)
RDW: 16.9 % — ABNORMAL HIGH (ref 11.5–15.5)
WBC: 15.6 10*3/uL — ABNORMAL HIGH (ref 4.0–10.5)
nRBC: 0 % (ref 0.0–0.2)

## 2020-04-21 LAB — URINALYSIS, COMPLETE (UACMP) WITH MICROSCOPIC
Bacteria, UA: NONE SEEN
Bilirubin Urine: NEGATIVE
Glucose, UA: NEGATIVE mg/dL
Ketones, ur: 20 mg/dL — AB
Leukocytes,Ua: NEGATIVE
Nitrite: NEGATIVE
Protein, ur: NEGATIVE mg/dL
Specific Gravity, Urine: 1.031 — ABNORMAL HIGH (ref 1.005–1.030)
pH: 6 (ref 5.0–8.0)

## 2020-04-21 LAB — COMPREHENSIVE METABOLIC PANEL
ALT: 56 U/L — ABNORMAL HIGH (ref 0–44)
AST: 64 U/L — ABNORMAL HIGH (ref 15–41)
Albumin: 2.2 g/dL — ABNORMAL LOW (ref 3.5–5.0)
Alkaline Phosphatase: 117 U/L (ref 38–126)
Anion gap: 10 (ref 5–15)
BUN: 5 mg/dL — ABNORMAL LOW (ref 6–20)
CO2: 21 mmol/L — ABNORMAL LOW (ref 22–32)
Calcium: 8.5 mg/dL — ABNORMAL LOW (ref 8.9–10.3)
Chloride: 102 mmol/L (ref 98–111)
Creatinine, Ser: 0.48 mg/dL (ref 0.44–1.00)
GFR calc Af Amer: >60 mL/min (ref >60)
GFR calc non Af Amer: >60 mL/min (ref >60)
Glucose, Bld: 72 mg/dL (ref 70–99)
Potassium: 4.3 mmol/L (ref 3.5–5.1)
Sodium: 133 mmol/L — ABNORMAL LOW (ref 135–145)
Total Bilirubin: 1.4 mg/dL — ABNORMAL HIGH (ref 0.3–1.2)
Total Protein: 5.7 g/dL — ABNORMAL LOW (ref 6.5–8.1)

## 2020-04-21 LAB — MAGNESIUM: Magnesium: 1.6 mg/dL — ABNORMAL LOW (ref 1.7–2.4)

## 2020-04-21 MED ORDER — GADOBUTROL 1 MMOL/ML IV SOLN
5.0000 mL | Freq: Once | INTRAVENOUS | Status: AC | PRN
  Administered 2020-04-21: 5 mL via INTRAVENOUS

## 2020-04-21 MED ORDER — LORAZEPAM 2 MG/ML IJ SOLN
2.0000 mg | Freq: Once | INTRAMUSCULAR | Status: AC
  Administered 2020-04-21: 2 mg via INTRAVENOUS
  Filled 2020-04-21: qty 1

## 2020-04-21 MED ORDER — SODIUM CHLORIDE 0.9 % IV SOLN
2.0000 g | Freq: Three times a day (TID) | INTRAVENOUS | Status: DC
  Administered 2020-04-21 – 2020-04-22 (×4): 2 g via INTRAVENOUS
  Filled 2020-04-21 (×5): qty 2

## 2020-04-21 MED ORDER — IOHEXOL 300 MG/ML  SOLN
75.0000 mL | Freq: Once | INTRAMUSCULAR | Status: AC | PRN
  Administered 2020-04-21: 75 mL via INTRAVENOUS

## 2020-04-21 MED ORDER — IOHEXOL 350 MG/ML SOLN
100.0000 mL | Freq: Once | INTRAVENOUS | Status: AC | PRN
  Administered 2020-04-21: 75 mL via INTRAVENOUS

## 2020-04-21 MED ORDER — SODIUM CHLORIDE 0.9 % IV SOLN
500.0000 mg | INTRAVENOUS | Status: DC
  Administered 2020-04-21 – 2020-04-22 (×2): 500 mg via INTRAVENOUS
  Filled 2020-04-21 (×2): qty 500

## 2020-04-21 MED ORDER — FENTANYL CITRATE (PF) 100 MCG/2ML IJ SOLN
12.5000 ug | Freq: Once | INTRAMUSCULAR | Status: AC
  Administered 2020-04-21: 12.5 ug via INTRAMUSCULAR
  Filled 2020-04-21: qty 2

## 2020-04-21 NOTE — Progress Notes (Signed)
PROGRESS NOTE  Nivea Wojdyla AQT:[id number] DOB: 06-11-1976 DOA: 04/17/2020 PCP: Patient, No Pcp Per  Brief History: 44 year old female with a history of polysubstance abuse including alcohol, cannabis, tobacco, pancreatic insufficiency, hepatitis C, anxiety/depression, chronic back pain, chronic pain syndrome, thrombocytopenia, hypertension, severe malnutrition presenting with 3-day history of nausea, vomiting with associated myalgias, arthralgias, and worsening abdominal pain. The patient states that her symptoms began on 04/14/2020. Unfortunately, she continues to drink 4 shots of liquor on a daily basis. She previously drank 1/5 on a daily basis. She states that her last drink was on the day of admission. However, it was noted that the patient had a bottle of liquor in the emergency department which she was consuming given to her medical care. The patient also complained of fevers up to 104.0 F on 04/16/2020. She denies any hematemesis, hematochezia, melena. She complains of some dysuria. In the emergency department, the patient was febrile up to 100.6 F with tachycardia. She was hemodynamically stable. Oxygen saturation was 95% on room air. BMP with sodium 132, potassium 2.9, serum creatinine 0.46. WBC 16.2 with hemoglobin 12.5 and platelets 172,000. The patient was started on ceftriaxone and alcohol withdrawal protocol.   Assessment/Plan: Severe Sepsis -present on admission -due to UTI and pneumonia -COVID--negative -continue IVF -lactic acid peaked 2.5 -continue ceftriaxone  -continues to have fevers daily -9/28 repeat CT abd--bilateral pyelonephritis unchanged, no abscess; increase CBD caliber; no cholecystitis -MRCP -9/28 CTA chest--no PE; bilateral effusion with LLL consolidation and mosaic attenuation upper lobe -add azithromycin to cover for atypicals -d/c ceftriaxone, start cefepime -repeat blood cultures  Pyelonephritis--EColi -UA>50 WBC -04/17/2020 CT  abdomen-heterogeneous left renal enhancement with a striated nephrogram. There was perinephric stranding on the left without fluid collection. There is slight enhancement of the right upper kidney. -04/21/20 repeat CT abd--no abscess; pyelonephritis unchanged -am CBC  Acute metabolic encephalopathy -Secondary to sepsis and alcohol -Mental status improving on the morning of 04/19/2020 -04/21/20--back to baseline per mother at bedside  Intractable nausea and vomiting -In part due to alcoholic gastritis and cannabis hyperemesis syndrome -claims to continued vomiting but has potato chips, ice cream and sandwich at bedside. -down grade to npo except ice chips -continue PPI -zofran q 6 hours ATC -Continue IV fluids  Polysubstance abuse -Including tobacco, alcohol, and cannabis -Cessation discussed -nicoderm patch  Alcohol abuse -Alcohol withdrawal protocol  Essential hypertension -Holding amlodipine secondary to soft blood pressureinitially  Hypokalemia/Hypomagnesemia -Replete -Check magnesium--1.0>>1.6>>1.9  Chronic thrombocytopenia -from EtOH abuseand sepsis, monitor -am CBC  Loose stools -likely due to pancreatic insufficiency -check cdiff--no more diarrhea  Chest pain -likely due to vomiting -atypical by history -cycle troponins51>>41 -personally reviewed CXR--no edema or consolidation -D-dimer-6.20 -personally reviewed EKG--sinus, no STT changes -CTA chest--no PE   Status is: Inpatient  Remains inpatient appropriate because:Hemodynamically unstable, Unsafe d/c plan and IV treatments appropriate due to intensity of illness or inability to take PO   Dispo: The patient is from:Home Anticipated d/c is TG:YBWL Anticipated d/c date is: 2 days Patient currently is not medically stable to d/c.        Family Communication:mother updated 9/28  Consultants:none  Code Status: FULL   DVT  Prophylaxis: [place] Heparin   Procedures: As Listed in Progress Note Above  Antibiotics: Ceftriaxone 9/24>>>9/27 Cefepime 9/28>> azithro 9/28>>      Subjective: Patient continues to complain of abdominal pain, primarily upper abd.  Still having some emesis but is improving.  Denies diarrhea.  Has cough  with cp and dyspnea on exertion.  No hematochezia, no melena  Objective: Vitals:   04/21/20 0625 04/21/20 0856 04/21/20 1158 04/21/20 1413  BP: (!) 143/89 (!) 148/107 (!) 152/102 (!) 142/98  Pulse: 96 91 85 85  Resp: 18 20 20 20   Temp: 99.8 F (37.7 C) 99 F (37.2 C) 98 F (36.7 C) 98.6 F (37 C)  TempSrc: Oral Oral Oral Oral  SpO2: 95% 96% 98% 98%  Weight:      Height:        Intake/Output Summary (Last 24 hours) at 04/21/2020 1549 Last data filed at 04/21/2020 0400 Gross per 24 hour  Intake 1001.77 ml  Output --  Net 1001.77 ml   Weight change: -0.6 kg Exam:   General:  Pt is alert, follows commands appropriately, not in acute distress  HEENT: No icterus, No thrush, No neck mass, [place]/AT  Cardiovascular: RRR, S1/S2, no rubs, no gallops  Respiratory: bibasilar rales. No wheeze  Abdomen: Soft/+BS, upper abd tender, non distended, no guarding  Extremities: No edema, No lymphangitis, No petechiae, No rashes, no synovitis   Data Reviewed: I have personally reviewed following labs and imaging studies Basic Metabolic Panel: Recent Labs  Lab 04/17/20 1453 04/18/20 0604 04/19/20 0638 04/20/20 0606 04/21/20 0322  NA 132* 130* 131* 135 133*  K 2.9* 2.3* 3.5 4.7 4.3  CL 90* 90* 98 103 102  CO2 26 28 25 23  21*  GLUCOSE 77 112* 97 79 72  BUN 5* <5* <5* <5* 5*  CREATININE 0.46 0.47 0.49 0.46 0.48  CALCIUM 7.6* 7.0* 7.1* 8.0* 8.5*  MG  --  1.0* 1.6* 1.9 1.6*   Liver Function Tests: Recent Labs  Lab 04/17/20 1453 04/18/20 0604 04/19/20 0638 04/20/20 0606 04/21/20 0322  AST 58* 99* 134* 122* 64*  ALT 55* 48* 63* 69* 56*  ALKPHOS 148* 121 106 117 117   BILITOT 1.3* 1.1 1.3* 1.7* 1.4*  PROT 5.9* 4.8* 4.6* 5.5* 5.7*  ALBUMIN 2.5* 1.9* 1.9* 2.2* 2.2*   Recent Labs  Lab 04/17/20 1453  LIPASE 16   Recent Labs  Lab 04/17/20 1453  AMMONIA 30   Coagulation Profile: Recent Labs  Lab 04/17/20 1922  INR 1.2   CBC: Recent Labs  Lab 04/17/20 1400 04/18/20 0604 04/19/20 0638 04/20/20 0606 04/21/20 0322  WBC 16.2* 17.3* 12.8* 13.7* 15.6*  NEUTROABS 13.5*  --  10.4*  --  11.1*  HGB 12.5 10.4* 10.2* 11.7* 12.2  HCT 37.4 29.6* 29.6* 35.4* 37.1  MCV 98.4 94.9 97.0 98.6 98.7  PLT 191 172 146* 168 215   Cardiac Enzymes: No results for input(s): CKTOTAL, CKMB, CKMBINDEX, TROPONINI in the last 168 hours. BNP: Invalid input(s): POCBNP CBG: No results for input(s): GLUCAP in the last 168 hours. HbA1C: No results for input(s): HGBA1C in the last 72 hours. Urine analysis:    Component Value Date/Time   COLORURINE YELLOW 04/17/2020 1400   APPEARANCEUR HAZY (A) 04/17/2020 1400   APPEARANCEUR Clear 12/07/2013 0133   LABSPEC 1.008 04/17/2020 1400   LABSPEC 1.006 12/07/2013 0133   PHURINE 7.0 04/17/2020 1400   GLUCOSEU NEGATIVE 04/17/2020 1400   GLUCOSEU Negative 12/07/2013 0133   HGBUR MODERATE (A) 04/17/2020 1400   BILIRUBINUR NEGATIVE 04/17/2020 1400   BILIRUBINUR Negative 12/07/2013 0133   KETONESUR NEGATIVE 04/17/2020 1400   PROTEINUR 30 (A) 04/17/2020 1400   UROBILINOGEN 0.2 04/28/2015 1559   NITRITE POSITIVE (A) 04/17/2020 1400   LEUKOCYTESUR LARGE (A) 04/17/2020 1400   LEUKOCYTESUR Negative 12/07/2013 0133  Sepsis Labs: @LABRCNTIP (procalcitonin:4,lacticidven:4) ) Recent Results (from the past 240 hour(s))  Resp Panel by RT PCR (RSV, Flu A&B, Covid) - Nasopharyngeal Swab     Status: None   Collection Time: 04/17/20  1:15 PM   Specimen: Nasopharyngeal Swab  Result Value Ref Range Status   SARS Coronavirus 2 by RT PCR NEGATIVE NEGATIVE Final    Comment: (NOTE) SARS-CoV-2 target nucleic acids are NOT  DETECTED.  The SARS-CoV-2 RNA is generally detectable in upper respiratoy specimens during the acute phase of infection. The lowest concentration of SARS-CoV-2 viral copies this assay can detect is 131 copies/mL. A negative result does not preclude SARS-Cov-2 infection and should not be used as the sole basis for treatment or other patient management decisions. A negative result may occur with  improper specimen collection/handling, submission of specimen other than nasopharyngeal swab, presence of viral mutation(s) within the areas targeted by this assay, and inadequate number of viral copies (<131 copies/mL). A negative result must be combined with clinical observations, patient history, and epidemiological information. The expected result is Negative.  Fact Sheet for Patients:  PinkCheek.be  Fact Sheet for Healthcare Providers:  GravelBags.it  This test is no t yet approved or cleared by the [place] FDA and  has been authorized for detection and/or diagnosis of SARS-CoV-2 by FDA under an Emergency Use Authorization (EUA). This EUA will remain  in effect (meaning this test can be used) for the duration of the COVID-19 declaration under Section 564(b)(1) of the Act, 21 U.S.C. section 360bbb-3(b)(1), unless the authorization is terminated or revoked sooner.     Influenza A by PCR NEGATIVE NEGATIVE Final   Influenza B by PCR NEGATIVE NEGATIVE Final    Comment: (NOTE) The Xpert Xpress SARS-CoV-2/FLU/RSV assay is intended as an aid in  the diagnosis of influenza from Nasopharyngeal swab specimens and  should not be used as a sole basis for treatment. Nasal washings and  aspirates are unacceptable for Xpert Xpress SARS-CoV-2/FLU/RSV  testing.  Fact Sheet for Patients: PinkCheek.be  Fact Sheet for Healthcare Providers: GravelBags.it  This test is not yet  approved or cleared by the [place] FDA and  has been authorized for detection and/or diagnosis of SARS-CoV-2 by  FDA under an Emergency Use Authorization (EUA). This EUA will remain  in effect (meaning this test can be used) for the duration of the  Covid-19 declaration under Section 564(b)(1) of the Act, 21  U.S.C. section 360bbb-3(b)(1), unless the authorization is  terminated or revoked.    Respiratory Syncytial Virus by PCR NEGATIVE NEGATIVE Final    Comment: (NOTE) Fact Sheet for Patients: PinkCheek.be  Fact Sheet for Healthcare Providers: GravelBags.it  This test is not yet approved or cleared by the [place] FDA and  has been authorized for detection and/or diagnosis of SARS-CoV-2 by  FDA under an Emergency Use Authorization (EUA). This EUA will remain  in effect (meaning this test can be used) for the duration of the  COVID-19 declaration under Section 564(b)(1) of the Act, 21 U.S.C.  section 360bbb-3(b)(1), unless the authorization is terminated or  revoked. Performed at [facility], [address], [place], [place] [address]   Urine culture     Status: Abnormal   Collection Time: 04/17/20  3:57 PM   Specimen: In/Out Cath Urine  Result Value Ref Range Status   Specimen Description   Final    IN/OUT CATH URINE Performed at [facility], [address], [place],  [address]    Special Requests  Final    NONE Performed at [facility], [address] [place], [place] [address]    Culture >=100,000 COLONIES/mL ESCHERICHIA COLI (A)  Final   Report Status 04/20/2020 FINAL  Final   Organism ID, Bacteria ESCHERICHIA COLI (A)  Final      Susceptibility   Escherichia coli - MIC*    AMPICILLIN <=2 SENSITIVE Sensitive     CEFAZOLIN <=4 SENSITIVE Sensitive     CEFTRIAXONE <=0.25 SENSITIVE Sensitive     CIPROFLOXACIN <=0.25 SENSITIVE Sensitive     GENTAMICIN <=1 SENSITIVE Sensitive     IMIPENEM <=0.25  SENSITIVE Sensitive     NITROFURANTOIN <=16 SENSITIVE Sensitive     TRIMETH/SULFA <=20 SENSITIVE Sensitive     AMPICILLIN/SULBACTAM <=2 SENSITIVE Sensitive     PIP/TAZO <=4 SENSITIVE Sensitive     * >=100,000 COLONIES/mL ESCHERICHIA COLI  Blood Culture (routine x 2)     Status: None (Preliminary result)   Collection Time: 04/17/20  4:11 PM   Specimen: BLOOD RIGHT HAND  Result Value Ref Range Status   Specimen Description BLOOD RIGHT HAND  Final   Special Requests   Final    BOTTLES DRAWN AEROBIC ONLY Blood Culture results may not be optimal due to an inadequate volume of blood received in culture bottles   Culture   Final    NO GROWTH 3 DAYS Performed at [facility], [address], [place], [place] [address]    Report Status PENDING  Incomplete  Blood Culture (routine x 2)     Status: None (Preliminary result)   Collection Time: 04/17/20  7:22 PM   Specimen: Left Antecubital; Blood  Result Value Ref Range Status   Specimen Description LEFT ANTECUBITAL  Final   Special Requests   Final    BOTTLES DRAWN AEROBIC ONLY Blood Culture results may not be optimal due to an inadequate volume of blood received in culture bottles   Culture   Final    NO GROWTH 3 DAYS Performed at [facility], [address], [place], [place] [address]    Report Status PENDING  Incomplete  MRSA PCR Screening     Status: None   Collection Time: 04/18/20  5:26 PM   Specimen: Nasal Mucosa; Nasopharyngeal  Result Value Ref Range Status   MRSA by PCR NEGATIVE NEGATIVE Final    Comment:        The GeneXpert MRSA Assay (FDA approved for NASAL specimens only), is one component of a comprehensive MRSA colonization surveillance program. It is not intended to diagnose MRSA infection nor to guide or monitor treatment for MRSA infections. Performed at [facility], [address], [place], [place] [address]      Scheduled Meds:  Chlorhexidine Gluconate Cloth  6 each Topical Daily   folic acid  1 mg Oral  Daily   heparin  5,000 Units Subcutaneous Q8H   LORazepam  0-4 mg Intravenous Q8H   multivitamin with minerals  1 tablet Oral Daily   nicotine  21 mg Transdermal Daily   ondansetron (ZOFRAN) IV  4 mg Intravenous Q6H   pantoprazole  40 mg Oral BID AC   thiamine  100 mg Oral Daily   Continuous Infusions:  azithromycin     ceFEPime (MAXIPIME) IV     lactated ringers 125 mL/hr at 04/20/20 1944    Procedures/Studies: CT ANGIO CHEST PE W OR WO CONTRAST  Result Date: 04/21/2020 CLINICAL DATA:  Shortness of breath with chest pain and elevated D-dimer EXAM: CT ANGIOGRAPHY CHEST WITH  CONTRAST TECHNIQUE: Multidetector CT imaging of the chest was performed using the standard protocol during bolus administration of intravenous contrast. Multiplanar CT image reconstructions and MIPs were obtained to evaluate the vascular anatomy. CONTRAST:  56mL OMNIPAQUE IOHEXOL 350 MG/ML SOLN COMPARISON:  Chest radiograph April 17, 2020 FINDINGS: Cardiovascular: There is no demonstrable pulmonary embolus. There is no thoracic aortic aneurysm or dissection. Visualized great vessels appear normal. There is a small pericardial effusion. No pericardial thickening evident. Mediastinum/Nodes: Thyroid appears unremarkable. No evident thoracic adenopathy. No esophageal lesions are appreciable. Lungs/Pleura: There are moderate free-flowing pleural effusions bilaterally. There is patchy airspace opacity in the left base with compressive atelectasis in the right base. There is slight mosaic attenuation in the upper lobe regions. Areas of atelectatic change noted in the lingula. Upper Abdomen: Diffuse decreased attenuation of the liver is indicative of hepatic steatosis. Visualized upper abdominal structures otherwise appear unremarkable. Musculoskeletal: No blastic or lytic bone lesions. No evident chest wall lesions. Review of the MIP images confirms the above findings. IMPRESSION: 1. No demonstrable pulmonary embolus. No  thoracic aortic aneurysm or dissection. 2.  Small pericardial effusion. 3. Sizable pleural effusions bilaterally with compressive atelectasis in the lung bases as well as an area felt to represent superimposed pneumonia in the lateral left base. Areas of atelectasis in the lingula noted. 4. Somewhat mosaic attenuation in the upper lobes may indicate underlying small airways obstructive disease. This somewhat ill-defined opacity can be seen with atypical organism pneumonia. Correlation with COVID-19 status advised in this regard. 5.  No evident adenopathy. 6.  Hepatic steatosis. Electronically Signed   By: Lowella Grip III M.D.   On: 04/21/2020 12:55   CT ABDOMEN PELVIS W CONTRAST  Result Date: 04/21/2020 CLINICAL DATA:  History of persistent fever, pyelonephritis EXAM: CT ABDOMEN AND PELVIS WITH CONTRAST TECHNIQUE: Multidetector CT imaging of the abdomen and pelvis was performed using the standard protocol following bolus administration of intravenous contrast. CONTRAST:  68mL OMNIPAQUE IOHEXOL 300 MG/ML  SOLN COMPARISON:  April 17, 2020 FINDINGS: Lower chest: Small to moderate bilateral pleural effusions, layering dependently within the visualized portions of the chest and associated with basilar volume loss. These are new compared to previous imaging. Hepatobiliary: Severe hepatic steatosis, no focal, suspicious hepatic lesion. No pericholecystic stranding. Common bile duct with slight increase in size compared to previous studies though similar to the recent prior, maximal caliber approximately 6-7 mm. Pancreas: Pancreas without peripancreatic fluid or ductal dilation. Spleen: Spleen normal in size and contour. Adrenals/Urinary Tract: Adrenal glands are normal. Signs of bilateral pyelonephritis worse on the LEFT. No hydronephrosis. Stomach/Bowel: Normal appendix. Stomach is under distended limiting assessment. Small bowel without dilation or perienteric stranding. No acute gastrointestinal process.  Vascular/Lymphatic: Calcified and noncalcified atheromatous plaque of the abdominal aorta. Numerous scattered retroperitoneal lymph nodes, slightly increased since July of 2021 and stable since the most recent comparison of September of 2021, largest approximately 8-9 mm along the LEFT periaortic chain. No pelvic adenopathy Reproductive: Small RIGHT ovarian dermoid with similar appearance. Other: Body wall edema increased from the previous exam. No free air. The small volume pelvic ascites similar to the previous study. Musculoskeletal: No acute bone finding. No destructive bone process. Spinal degenerative changes. L4-5 spinal fusion as before. IMPRESSION: 1. Signs of bilateral pyelonephritis worse on the LEFT. Findings are similar to previous imaging and not associated with abscess or hydronephrosis. 2. Increased caliber of the common bile duct as compared to more remote studies with similar appearance compared to the most recent  comparison. Correlate with biliary enzymes and with liver enzymes with MRCP as warranted. No gallbladder distension or pericholecystic stranding. 3. Worsening anasarca with increased body wall edema and development of bilateral effusions. 4. Severe hepatic steatosis. 5. Numerous scattered retroperitoneal lymph nodes, slightly increased since July of 2021 and stable since the most recent comparison of September of 2021 and stable since the most recent comparison of September of 2021, largest approximately 8-9 mm along the LEFT periaortic chain. These are likely reactive. 6. Small RIGHT ovarian dermoid with similar appearance. 7. Aortic atherosclerosis. Aortic Atherosclerosis (ICD10-I70.0). Electronically Signed   By: Zetta Bills M.D.   On: 04/21/2020 10:31   CT ABDOMEN PELVIS W CONTRAST  Result Date: 04/17/2020 CLINICAL DATA:  Acute nonlocalized abdominal pain. Fever and malaise. Abdominal swelling. EXAM: CT ABDOMEN AND PELVIS WITH CONTRAST TECHNIQUE: Multidetector CT imaging of the  abdomen and pelvis was performed using the standard protocol following bolus administration of intravenous contrast. CONTRAST:  160mL OMNIPAQUE IOHEXOL 300 MG/ML  SOLN COMPARISON:  Most recent CT 01/26/2020 FINDINGS: Lower chest: No pleural fluid or focal airspace disease. The heart is normal in size. Hepatobiliary: Again seen advanced hepatic steatosis. Mildly enlarged liver spanning 21 cm cranial caudal. No evidence of focal hepatic lesion. Patent portal vein. Pancreas: Ill-defined fat planes adjacent to the pancreatic head. No acute peripancreatic collection. No ductal dilatation. Spleen: Normal in size without focal abnormality. Adrenals/Urinary Tract: No adrenal nodule. Diffusely heterogeneous left renal enhancement with striated nephrogram in areas of diminished perfusion involving the upper, mid lower kidney. There is left perinephric edema. No evidence of perirenal fluid collection. No renal or ureteral calculi. Slight heterogeneous enhancement of the upper right kidney. Minimal adjacent perinephric edema. The urinary bladder is physiologically distended without wall thickening. Stomach/Bowel: Bowel evaluation is limited in the absence of enteric contrast as well as mild motion artifact. Decompressed stomach. Normal positioning of the duodenum and ligament of Treitz. There is no small bowel obstruction or inflammatory change. Normal appendix. Small volume of colonic stool. There is no colonic wall thickening or inflammation. Vascular/Lymphatic: Moderate aorto bi-iliac atherosclerosis, advanced for age. No aortic aneurysm. The portal vein is patent. There multiple prominent retroperitoneal lymph nodes are borderline enlarged. Prominent central mesenteric nodes. The renal arteries appear patent. Reproductive: Free fluid adjacent to the uterus which is otherwise unremarkable. 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 Other: Small amount of free fluid in the pelvis. There is no upper abdominal ascites. No abdominal  wall hernia. There is generalized body wall edema. Musculoskeletal: Posterior lumbar fusion with intact hardware. There are no acute or suspicious osseous abnormalities. IMPRESSION: 1. Heterogeneous left renal enhancement with striated nephrogram and areas of diminished perfusion involving the upper, mid lower kidney, consistent with pyelonephritis. No perirenal fluid collection. Findings also suspicious for pyelonephritis of the upper pole of the right kidney. 2. Ill-defined fat planes adjacent to the pancreatic head, may represent acute pancreatitis. Recommend correlation with pancreatic enzymes. 3. Advanced hepatic steatosis and mild hepatomegaly. 4. 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 5. Small amount of free fluid in the pelvis. Generalized body wall edema. 6. Aortic atherosclerosis is advanced for age. Aortic Atherosclerosis (ICD10-I70.0). Electronically Signed   By: Keith Rake M.D.   On: 04/17/2020 19:03   DG Chest Portable 1 View  Result Date: 04/17/2020 CLINICAL DATA:  Cough EXAM: PORTABLE CHEST 1 VIEW COMPARISON:  09/04/2019 chest radiograph and prior. FINDINGS: The heart size and mediastinal contours are within normal limits. Both lungs are clear. No pneumothorax or pleural  effusion. No acute osseous abnormality. IMPRESSION: No focal airspace disease. Electronically Signed   By: Primitivo Gauze M.D.   On: 04/17/2020 14:52   [place] EKG SITE RITE  Result Date: 04/21/2020 If Site Rite image not attached, placement could not be confirmed due to current cardiac rhythm.   Orson Eva, DO  [facility]  If 7PM-7AM, please contact night-coverage [email or web address] Password [facility] 04/21/2020, 3:49 PM   LOS: 4 days

## 2020-04-21 NOTE — Progress Notes (Signed)
Unsuccessful attempt of 20 gauge IV access with [place]. Midlevel made aware.

## 2020-04-22 LAB — URINE CULTURE: Culture: <10000 — AB

## 2020-04-22 LAB — CULTURE, BLOOD (ROUTINE X 2)
Culture: NO GROWTH
Culture: NO GROWTH

## 2020-04-22 LAB — LEGIONELLA PNEUMOPHILA SEROGP 1 UR AG: L. pneumophila Serogp 1 Ur Ag: NEGATIVE

## 2020-04-22 MED ORDER — SODIUM CHLORIDE 0.9 % IV SOLN: INTRAVENOUS | Status: AC

## 2020-04-22 MED ORDER — MAGNESIUM SULFATE 50 % IJ SOLN: 1.0000 g | Freq: Once | INTRAMUSCULAR | Status: DC

## 2020-04-22 MED ORDER — FAMOTIDINE 20 MG PO TABS
20.0000 mg | ORAL_TABLET | Freq: Every day | ORAL | Status: DC
  Administered 2020-04-22: 20 mg via ORAL
  Filled 2020-04-22: qty 1

## 2020-04-22 MED ORDER — HYDROMORPHONE HCL 1 MG/ML IJ SOLN
1.0000 mg | INTRAMUSCULAR | Status: DC | PRN
  Administered 2020-04-22 (×3): 1 mg via INTRAVENOUS
  Filled 2020-04-22 (×4): qty 1

## 2020-04-22 MED ORDER — MAGNESIUM SULFATE IN D5W 1-5 GM/100ML-% IV SOLN
1.0000 g | Freq: Once | INTRAVENOUS | Status: AC
  Administered 2020-04-22: 1 g via INTRAVENOUS
  Filled 2020-04-22: qty 100

## 2020-04-22 NOTE — Progress Notes (Signed)
PROGRESS NOTE  Minette Manders YIR:[id number] DOB: 03/17/1976 DOA: 04/17/2020 PCP: Patient, No Pcp Per  Brief History: 44 year old female with a history of polysubstance abuse including alcohol, cannabis, tobacco, pancreatic insufficiency, hepatitis C, anxiety/depression, chronic back pain, chronic pain syndrome, thrombocytopenia, hypertension, severe malnutrition presenting with 3-day history of nausea, vomiting with associated myalgias, arthralgias, and worsening abdominal pain. The patient states that her symptoms began on 04/14/2020. Unfortunately, she continues to drink 4 shots of liquor on a daily basis. She previously drank 1/5 on a daily basis. She states that her last drink was on the day of admission. However, it was noted that the patient had a bottle of liquor in the emergency department which she was consuming given to her medical care. The patient also complained of fevers up to 104.0 F on 04/16/2020. She denies any hematemesis, hematochezia, melena. She complains of some dysuria. In the emergency department, the patient was febrile up to 100.6 F with tachycardia. She was hemodynamically stable. Oxygen saturation was 95% on room air. BMP with sodium 132, potassium 2.9, serum creatinine 0.46. WBC 16.2 with hemoglobin 12.5 and platelets 172,000. The patient was started on ceftriaxone and alcohol withdrawal protocol.   Assessment/Plan: Severe Sepsis -present on admission -due to UTI and pneumonia -COVID--negative -continue IVF -lactic acid peaked 2.5 -continue ceftriaxone  -continues to have fevers daily -9/28 repeat CT abd--bilateral pyelonephritis unchanged, no abscess; increase CBD caliber; no cholecystitis -MRCP -9/28 CTA chest--no PE; bilateral effusion with LLL consolidation and mosaic attenuation upper lobe -Continue cefepime and Zithromax. -repeat blood cultures without growth.  Pyelonephritis--EColi -UA>50 WBC -04/17/2020 CT abdomen-heterogeneous  left renal enhancement with a striated nephrogram. There was perinephric stranding on the left without fluid collection. There is slight enhancement of the right upper kidney. -04/21/20 repeat CT abd--no abscess; pyelonephritis unchanged -Continue current antibiotic therapy. -Based on culture; Micronase and is pansensitive.  Acute metabolic encephalopathy -Secondary to sepsis and alcohol -Mental status improved and back to baseline.   -Continue to follow clinical response. -Constant reorientation and supportive care will be provided.  Intractable nausea and vomiting -In part due to alcoholic gastritis and cannabis hyperemesis syndrome -Continue PPI -Continue IV fluids -Slowly advancing diet. -Continue as needed antiemetics.  Polysubstance abuse -Including tobacco, alcohol, and cannabis -Extensive cessation counseling provided -Continue nicoderm patch  Alcohol abuse -Continue alcohol withdrawal protocol  Transaminitis -In the setting of alcohol abuse and sepsis -LFTs downtrending and improving constantly -Continue to follow trend intermittently. -Patient advised to stop alcohol consumption.  Essential hypertension -Continue holding amlodipine secondary to soft blood pressure -Follow vital signs.  Hypokalemia/Hypomagnesemia -Continue to follow electrolytes trend and further replete as needed.  Chronic thrombocytopenia -from EtOH abuseand sepsis, monitor -No signs of overt bleeding -Follow platelets count.  Loose stools -likely due to pancreatic insufficiency -check cdiff--no more diarrhea -We will advance diet.  Chest pain -likely due to vomiting -atypical by history -cycle troponins51>>41 -personally reviewed CXR--no edema or consolidation -D-dimer-6.20 -personally reviewed EKG--sinus, no STT changes -CTA chest--no PE -Continue supportive care and analgesics.   Status is: Inpatient  Remains inpatient appropriate because:Hemodynamically  unstable, Unsafe d/c plan and IV treatments appropriate due to intensity of illness or inability to take PO   Dispo: The patient is from:Home Anticipated d/c is JK:KXFG Anticipated d/c date is: 2 days Patient currently is not medically stable to d/c.  Continue IV antibiotics; slowly advance diet and assess for tolerance.  Continue IV fluids and supportive care.  Family Communication:No family at bedside.  Consultants:none  Code Status: FULL   DVT Prophylaxis: [place] Heparin   Procedures: As Listed in Progress Note Above  Antibiotics: Ceftriaxone 9/24>>>9/27 Cefepime 9/28>> azithro 9/28>>   Subjective: Low-grade temperature overnight;  no further episode of fever.  Patient reports significant back pain and increased frequency.  No further vomiting.  Objective: Vitals:   04/21/20 1413 04/22/20 0018 04/22/20 0605 04/22/20 1512  BP: (!) 142/98 111/81 (!) 134/97 127/83  Pulse: 85 88 83 80  Resp: 20 18 20 16   Temp: 98.6 F (37 C) 98.7 F (37.1 C) 98.6 F (37 C) 98.5 F (36.9 C)  TempSrc: Oral Oral Oral Oral  SpO2: 98% 95% 99% 100%  Weight:   62.7 kg   Height:        Intake/Output Summary (Last 24 hours) at 04/22/2020 1825 Last data filed at 04/22/2020 1700 Gross per 24 hour  Intake 3091.11 ml  Output --  Net 3091.11 ml   Weight change: -2.8 kg  Exam: General exam: Alert, awake, oriented x 3; complaining of severe lower back pain and reports increased frequency.  At this time denies any further episode of vomiting and very little nausea.  Would like to have her diet advanced.  Low-grade temperature overnight but since then, has remained afebrile. Respiratory system: Good oxygen saturation on room air; no using accessory muscles.  Positive scattered rhonchi. Cardiovascular system:RRR. No murmurs, rubs, gallops.  No JVD. Gastrointestinal system: Abdomen is nondistended, soft and nontender. No organomegaly  or masses felt. Normal bowel sounds heard. Central nervous system: Alert and oriented. No focal neurological deficits. Extremities: No cyanosis or clubbing. Skin: No rashes, no petechiae. Psychiatry: Judgement and insight appear normal. Mood & affect appropriate.     Data Reviewed: I have personally reviewed following labs and imaging studies  Basic Metabolic Panel: Recent Labs  Lab 04/17/20 1453 04/18/20 0604 04/19/20 0638 04/20/20 0606 04/21/20 0322  NA 132* 130* 131* 135 133*  K 2.9* 2.3* 3.5 4.7 4.3  CL 90* 90* 98 103 102  CO2 26 28 25 23  21*  GLUCOSE 77 112* 97 79 72  BUN 5* <5* <5* <5* 5*  CREATININE 0.46 0.47 0.49 0.46 0.48  CALCIUM 7.6* 7.0* 7.1* 8.0* 8.5*  MG  --  1.0* 1.6* 1.9 1.6*   Liver Function Tests: Recent Labs  Lab 04/17/20 1453 04/18/20 0604 04/19/20 0638 04/20/20 0606 04/21/20 0322  AST 58* 99* 134* 122* 64*  ALT 55* 48* 63* 69* 56*  ALKPHOS 148* 121 106 117 117  BILITOT 1.3* 1.1 1.3* 1.7* 1.4*  PROT 5.9* 4.8* 4.6* 5.5* 5.7*  ALBUMIN 2.5* 1.9* 1.9* 2.2* 2.2*   Recent Labs  Lab 04/17/20 1453  LIPASE 16   Recent Labs  Lab 04/17/20 1453  AMMONIA 30   Coagulation Profile: Recent Labs  Lab 04/17/20 1922  INR 1.2   CBC: Recent Labs  Lab 04/17/20 1400 04/18/20 0604 04/19/20 0638 04/20/20 0606 04/21/20 0322  WBC 16.2* 17.3* 12.8* 13.7* 15.6*  NEUTROABS 13.5*  --  10.4*  --  11.1*  HGB 12.5 10.4* 10.2* 11.7* 12.2  HCT 37.4 29.6* 29.6* 35.4* 37.1  MCV 98.4 94.9 97.0 98.6 98.7  PLT 191 172 146* 168 215   Urine analysis:    Component Value Date/Time   COLORURINE YELLOW 04/21/2020 1630   APPEARANCEUR CLEAR 04/21/2020 1630   APPEARANCEUR Clear 12/07/2013 0133   LABSPEC 1.031 (H) 04/21/2020 1630   LABSPEC 1.006 12/07/2013 0133   PHURINE 6.0 04/21/2020  [place] 04/21/2020 1630   GLUCOSEU Negative 12/07/2013 0133   HGBUR SMALL (A) 04/21/2020 1630   BILIRUBINUR NEGATIVE 04/21/2020 1630   BILIRUBINUR Negative  12/07/2013 0133   KETONESUR 20 (A) 04/21/2020 1630   PROTEINUR NEGATIVE 04/21/2020 1630   UROBILINOGEN 0.2 04/28/2015 1559   NITRITE NEGATIVE 04/21/2020 1630   LEUKOCYTESUR NEGATIVE 04/21/2020 1630   LEUKOCYTESUR Negative 12/07/2013 0133    Recent Results (from the past 240 hour(s))  Resp Panel by RT PCR (RSV, Flu A&B, Covid) - Nasopharyngeal Swab     Status: None   Collection Time: 04/17/20  1:15 PM   Specimen: Nasopharyngeal Swab  Result Value Ref Range Status   SARS Coronavirus 2 by RT PCR NEGATIVE NEGATIVE Final    Comment: (NOTE) SARS-CoV-2 target nucleic acids are NOT DETECTED.  The SARS-CoV-2 RNA is generally detectable in upper respiratoy specimens during the acute phase of infection. The lowest concentration of SARS-CoV-2 viral copies this assay can detect is 131 copies/mL. A negative result does not preclude SARS-Cov-2 infection and should not be used as the sole basis for treatment or other patient management decisions. A negative result may occur with  improper specimen collection/handling, submission of specimen other than nasopharyngeal swab, presence of viral mutation(s) within the areas targeted by this assay, and inadequate number of viral copies (<131 copies/mL). A negative result must be combined with clinical observations, patient history, and epidemiological information. The expected result is Negative.  Fact Sheet for Patients:  PinkCheek.be  Fact Sheet for Healthcare Providers:  GravelBags.it  This test is no t yet approved or cleared by the [place] FDA and  has been authorized for detection and/or diagnosis of SARS-CoV-2 by FDA under an Emergency Use Authorization (EUA). This EUA will remain  in effect (meaning this test can be used) for the duration of the COVID-19 declaration under Section 564(b)(1) of the Act, 21 U.S.C. section 360bbb-3(b)(1), unless the authorization is terminated  or revoked sooner.     Influenza A by PCR NEGATIVE NEGATIVE Final   Influenza B by PCR NEGATIVE NEGATIVE Final    Comment: (NOTE) The Xpert Xpress SARS-CoV-2/FLU/RSV assay is intended as an aid in  the diagnosis of influenza from Nasopharyngeal swab specimens and  should not be used as a sole basis for treatment. Nasal washings and  aspirates are unacceptable for Xpert Xpress SARS-CoV-2/FLU/RSV  testing.  Fact Sheet for Patients: PinkCheek.be  Fact Sheet for Healthcare Providers: GravelBags.it  This test is not yet approved or cleared by the [place] FDA and  has been authorized for detection and/or diagnosis of SARS-CoV-2 by  FDA under an Emergency Use Authorization (EUA). This EUA will remain  in effect (meaning this test can be used) for the duration of the  Covid-19 declaration under Section 564(b)(1) of the Act, 21  U.S.C. section 360bbb-3(b)(1), unless the authorization is  terminated or revoked.    Respiratory Syncytial Virus by PCR NEGATIVE NEGATIVE Final    Comment: (NOTE) Fact Sheet for Patients: PinkCheek.be  Fact Sheet for Healthcare Providers: GravelBags.it  This test is not yet approved or cleared by the [place] FDA and  has been authorized for detection and/or diagnosis of SARS-CoV-2 by  FDA under an Emergency Use Authorization (EUA). This EUA will remain  in effect (meaning this test can be used) for the duration of the  COVID-19 declaration under Section 564(b)(1) of the Act, 21 U.S.C.  section 360bbb-3(b)(1), unless the authorization is terminated or  revoked. Performed at  [facility], [address] [place], [place] [address]   Urine culture     Status: Abnormal   Collection Time: 04/17/20  3:57 PM   Specimen: In/Out Cath Urine  Result Value Ref Range Status   Specimen Description   Final    IN/OUT CATH URINE Performed at  [facility], [address], [place], [place] [address]    Special Requests   Final    NONE Performed at [facility], [address], [place], [place] [address]    Culture >=100,000 COLONIES/mL ESCHERICHIA COLI (A)  Final   Report Status 04/20/2020 FINAL  Final   Organism ID, Bacteria ESCHERICHIA COLI (A)  Final      Susceptibility   Escherichia coli - MIC*    AMPICILLIN <=2 SENSITIVE Sensitive     CEFAZOLIN <=4 SENSITIVE Sensitive     CEFTRIAXONE <=0.25 SENSITIVE Sensitive     CIPROFLOXACIN <=0.25 SENSITIVE Sensitive     GENTAMICIN <=1 SENSITIVE Sensitive     IMIPENEM <=0.25 SENSITIVE Sensitive     NITROFURANTOIN <=16 SENSITIVE Sensitive     TRIMETH/SULFA <=20 SENSITIVE Sensitive     AMPICILLIN/SULBACTAM <=2 SENSITIVE Sensitive     PIP/TAZO <=4 SENSITIVE Sensitive     * >=100,000 COLONIES/mL ESCHERICHIA COLI  Blood Culture (routine x 2)     Status: None   Collection Time: 04/17/20  4:11 PM   Specimen: BLOOD RIGHT HAND  Result Value Ref Range Status   Specimen Description BLOOD RIGHT HAND  Final   Special Requests   Final    BOTTLES DRAWN AEROBIC ONLY Blood Culture results may not be optimal due to an inadequate volume of blood received in culture bottles   Culture   Final    NO GROWTH 5 DAYS Performed at [facility], [address], [place], [place] [address]    Report Status 04/22/2020 FINAL  Final  Blood Culture (routine x 2)     Status: None   Collection Time: 04/17/20  7:22 PM   Specimen: Left Antecubital; Blood  Result Value Ref Range Status   Specimen Description LEFT ANTECUBITAL  Final   Special Requests   Final    BOTTLES DRAWN AEROBIC ONLY Blood Culture results may not be optimal due to an inadequate volume of blood received in culture bottles   Culture   Final    NO GROWTH 5 DAYS Performed at [facility], [address], [place], [place] [address]    Report Status 04/22/2020 FINAL  Final  MRSA PCR Screening     Status: None   Collection Time: 04/18/20  5:26  PM   Specimen: Nasal Mucosa; Nasopharyngeal  Result Value Ref Range Status   MRSA by PCR NEGATIVE NEGATIVE Final    Comment:        The GeneXpert MRSA Assay (FDA approved for NASAL specimens only), is one component of a comprehensive MRSA colonization surveillance program. It is not intended to diagnose MRSA infection nor to guide or monitor treatment for MRSA infections. Performed at [facility], [address] [place], [place] [address]   Culture, blood (Routine X 2) w Reflex to ID Panel     Status: None (Preliminary result)   Collection Time: 04/21/20  4:09 PM   Specimen: BLOOD RIGHT FOREARM  Result Value Ref Range Status   Specimen Description BLOOD RIGHT FOREARM  Final   Special Requests   Final    BOTTLES DRAWN AEROBIC ONLY Blood Culture adequate volume   Culture   Final  NO GROWTH < 24 HOURS Performed at [facility], [address] [place], [place] [address]    Report Status PENDING  Incomplete  Culture, blood (Routine X 2) w Reflex to ID Panel     Status: None (Preliminary result)   Collection Time: 04/21/20  4:16 PM   Specimen: BLOOD RIGHT HAND  Result Value Ref Range Status   Specimen Description BLOOD RIGHT HAND  Final   Special Requests   Final    BOTTLES DRAWN AEROBIC ONLY Blood Culture adequate volume   Culture   Final    NO GROWTH < 24 HOURS Performed at [facility], [address], [place], [place] [address]    Report Status PENDING  Incomplete     Scheduled Meds:  Chlorhexidine Gluconate Cloth  6 each Topical Daily   famotidine  20 mg Oral QHS   folic acid  1 mg Oral Daily   heparin  5,000 Units Subcutaneous Q8H   multivitamin with minerals  1 tablet Oral Daily   nicotine  21 mg Transdermal Daily   ondansetron (ZOFRAN) IV  4 mg Intravenous Q6H   pantoprazole  40 mg Oral BID AC   thiamine  100 mg Oral Daily   Continuous Infusions:  sodium chloride 100 mL/hr at 04/22/20 0830   azithromycin 500 mg (04/22/20 1805)   ceFEPime  (MAXIPIME) IV 2 g (04/22/20 1817)   lactated ringers 125 mL/hr at 04/22/20 0240    Procedures/Studies: CT ANGIO CHEST PE W OR WO CONTRAST  Result Date: 04/21/2020 CLINICAL DATA:  Shortness of breath with chest pain and elevated D-dimer EXAM: CT ANGIOGRAPHY CHEST WITH CONTRAST TECHNIQUE: Multidetector CT imaging of the chest was performed using the standard protocol during bolus administration of intravenous contrast. Multiplanar CT image reconstructions and MIPs were obtained to evaluate the vascular anatomy. CONTRAST:  58mL OMNIPAQUE IOHEXOL 350 MG/ML SOLN COMPARISON:  Chest radiograph April 17, 2020 FINDINGS: Cardiovascular: There is no demonstrable pulmonary embolus. There is no thoracic aortic aneurysm or dissection. Visualized great vessels appear normal. There is a small pericardial effusion. No pericardial thickening evident. Mediastinum/Nodes: Thyroid appears unremarkable. No evident thoracic adenopathy. No esophageal lesions are appreciable. Lungs/Pleura: There are moderate free-flowing pleural effusions bilaterally. There is patchy airspace opacity in the left base with compressive atelectasis in the right base. There is slight mosaic attenuation in the upper lobe regions. Areas of atelectatic change noted in the lingula. Upper Abdomen: Diffuse decreased attenuation of the liver is indicative of hepatic steatosis. Visualized upper abdominal structures otherwise appear unremarkable. Musculoskeletal: No blastic or lytic bone lesions. No evident chest wall lesions. Review of the MIP images confirms the above findings. IMPRESSION: 1. No demonstrable pulmonary embolus. No thoracic aortic aneurysm or dissection. 2.  Small pericardial effusion. 3. Sizable pleural effusions bilaterally with compressive atelectasis in the lung bases as well as an area felt to represent superimposed pneumonia in the lateral left base. Areas of atelectasis in the lingula noted. 4. Somewhat mosaic attenuation in the upper  lobes may indicate underlying small airways obstructive disease. This somewhat ill-defined opacity can be seen with atypical organism pneumonia. Correlation with COVID-19 status advised in this regard. 5.  No evident adenopathy. 6.  Hepatic steatosis. Electronically Signed   By: Lowella Grip III M.D.   On: 04/21/2020 12:55   CT ABDOMEN PELVIS W CONTRAST  Result Date: 04/21/2020 CLINICAL DATA:  History of persistent fever, pyelonephritis EXAM: CT ABDOMEN AND PELVIS WITH CONTRAST TECHNIQUE: Multidetector CT imaging of the abdomen and pelvis was  performed using the standard protocol following bolus administration of intravenous contrast. CONTRAST:  30mL OMNIPAQUE IOHEXOL 300 MG/ML  SOLN COMPARISON:  April 17, 2020 FINDINGS: Lower chest: Small to moderate bilateral pleural effusions, layering dependently within the visualized portions of the chest and associated with basilar volume loss. These are new compared to previous imaging. Hepatobiliary: Severe hepatic steatosis, no focal, suspicious hepatic lesion. No pericholecystic stranding. Common bile duct with slight increase in size compared to previous studies though similar to the recent prior, maximal caliber approximately 6-7 mm. Pancreas: Pancreas without peripancreatic fluid or ductal dilation. Spleen: Spleen normal in size and contour. Adrenals/Urinary Tract: Adrenal glands are normal. Signs of bilateral pyelonephritis worse on the LEFT. No hydronephrosis. Stomach/Bowel: Normal appendix. Stomach is under distended limiting assessment. Small bowel without dilation or perienteric stranding. No acute gastrointestinal process. Vascular/Lymphatic: Calcified and noncalcified atheromatous plaque of the abdominal aorta. Numerous scattered retroperitoneal lymph nodes, slightly increased since July of 2021 and stable since the most recent comparison of September of 2021, largest approximately 8-9 mm along the LEFT periaortic chain. No pelvic adenopathy  Reproductive: Small RIGHT ovarian dermoid with similar appearance. Other: Body wall edema increased from the previous exam. No free air. The small volume pelvic ascites similar to the previous study. Musculoskeletal: No acute bone finding. No destructive bone process. Spinal degenerative changes. L4-5 spinal fusion as before. IMPRESSION: 1. Signs of bilateral pyelonephritis worse on the LEFT. Findings are similar to previous imaging and not associated with abscess or hydronephrosis. 2. Increased caliber of the common bile duct as compared to more remote studies with similar appearance compared to the most recent comparison. Correlate with biliary enzymes and with liver enzymes with MRCP as warranted. No gallbladder distension or pericholecystic stranding. 3. Worsening anasarca with increased body wall edema and development of bilateral effusions. 4. Severe hepatic steatosis. 5. Numerous scattered retroperitoneal lymph nodes, slightly increased since July of 2021 and stable since the most recent comparison of September of 2021 and stable since the most recent comparison of September of 2021, largest approximately 8-9 mm along the LEFT periaortic chain. These are likely reactive. 6. Small RIGHT ovarian dermoid with similar appearance. 7. Aortic atherosclerosis. Aortic Atherosclerosis (ICD10-I70.0). Electronically Signed   By: Zetta Bills M.D.   On: 04/21/2020 10:31   CT ABDOMEN PELVIS W CONTRAST  Result Date: 04/17/2020 CLINICAL DATA:  Acute nonlocalized abdominal pain. Fever and malaise. Abdominal swelling. EXAM: CT ABDOMEN AND PELVIS WITH CONTRAST TECHNIQUE: Multidetector CT imaging of the abdomen and pelvis was performed using the standard protocol following bolus administration of intravenous contrast. CONTRAST:  184mL OMNIPAQUE IOHEXOL 300 MG/ML  SOLN COMPARISON:  Most recent CT 01/26/2020 FINDINGS: Lower chest: No pleural fluid or focal airspace disease. The heart is normal in size. Hepatobiliary: Again  seen advanced hepatic steatosis. Mildly enlarged liver spanning 21 cm cranial caudal. No evidence of focal hepatic lesion. Patent portal vein. Pancreas: Ill-defined fat planes adjacent to the pancreatic head. No acute peripancreatic collection. No ductal dilatation. Spleen: Normal in size without focal abnormality. Adrenals/Urinary Tract: No adrenal nodule. Diffusely heterogeneous left renal enhancement with striated nephrogram in areas of diminished perfusion involving the upper, mid lower kidney. There is left perinephric edema. No evidence of perirenal fluid collection. No renal or ureteral calculi. Slight heterogeneous enhancement of the upper right kidney. Minimal adjacent perinephric edema. The urinary bladder is physiologically distended without wall thickening. Stomach/Bowel: Bowel evaluation is limited in the absence of enteric contrast as well as mild motion artifact. Decompressed stomach. Normal  positioning of the duodenum and ligament of Treitz. There is no small bowel obstruction or inflammatory change. Normal appendix. Small volume of colonic stool. There is no colonic wall thickening or inflammation. Vascular/Lymphatic: Moderate aorto bi-iliac atherosclerosis, advanced for age. No aortic aneurysm. The portal vein is patent. There multiple prominent retroperitoneal lymph nodes are borderline enlarged. Prominent central mesenteric nodes. The renal arteries appear patent. Reproductive: Free fluid adjacent to the uterus which is otherwise unremarkable. 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 Other: Small amount of free fluid in the pelvis. There is no upper abdominal ascites. No abdominal wall hernia. There is generalized body wall edema. Musculoskeletal: Posterior lumbar fusion with intact hardware. There are no acute or suspicious osseous abnormalities. IMPRESSION: 1. Heterogeneous left renal enhancement with striated nephrogram and areas of diminished perfusion involving the upper, mid lower kidney,  consistent with pyelonephritis. No perirenal fluid collection. Findings also suspicious for pyelonephritis of the upper pole of the right kidney. 2. Ill-defined fat planes adjacent to the pancreatic head, may represent acute pancreatitis. Recommend correlation with pancreatic enzymes. 3. Advanced hepatic steatosis and mild hepatomegaly. 4. 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 5. Small amount of free fluid in the pelvis. Generalized body wall edema. 6. Aortic atherosclerosis is advanced for age. Aortic Atherosclerosis (ICD10-I70.0). Electronically Signed   By: Keith Rake M.D.   On: 04/17/2020 19:03   MR 3D Recon At Scanner  Result Date: 04/22/2020 CLINICAL DATA:  Nausea/vomiting, fatty liver, dilated common bile duct on CT, hyperbilirubinemia EXAM: MRI ABDOMEN WITHOUT AND WITH CONTRAST (INCLUDING MRCP) TECHNIQUE: Multiplanar multisequence MR imaging of the abdomen was performed both before and after the administration of intravenous contrast. Heavily T2-weighted images of the biliary and pancreatic ducts were obtained, and three-dimensional MRCP images were rendered by post processing. CONTRAST:  20mL GADAVIST GADOBUTROL 1 MMOL/ML IV SOLN COMPARISON:  CT abdomen/pelvis dated 04/21/2020 FINDINGS: Lower chest: Small bilateral pleural effusions. Hepatobiliary: Severe hepatic steatosis with focal fatty sparing along the gallbladder fossa. No morphologic findings of cirrhosis. No suspicious/enhancing hepatic lesions. Gallbladder is unremarkable. No intrahepatic ductal dilatation. Mildly prominent common duct, measuring 9 mm (series 5/image 12), although smoothly tapering at the ampulla and similar dating back to multiple CTs and an MR in 2020. No choledocholithiasis is seen. Pancreas: Within normal limits. No peripancreatic inflammatory changes/fluid. Spleen:  Within normal limits. Adrenals/Urinary Tract:  Adrenal glands are within normal limits. Kidneys are within normal limits, noting very mild differential  delayed enhancement in the left kidney, likely reflecting sequela of recent infection/pyelonephritis. No perirenal abscess. No hydronephrosis. Stomach/Bowel: Stomach is within normal limits. Visualized bowel is grossly unremarkable. Vascular/Lymphatic:  No evidence of abdominal aortic aneurysm. No suspicious abdominal lymphadenopathy. Other:  No abdominal ascites. Musculoskeletal: No focal osseous lesions. IMPRESSION: Mildly prominent common duct, measuring 9 mm. However, no cholelithiasis or choledocholithiasis is seen. This appearance is similar dating back to multiple CTs and MR in 2020. As such, this is not acute and of questionable clinical significance. Severe hepatic steatosis with focal fatty sparing. Very mild sequela of recent infection/pyelonephritis involving the left kidney. No perirenal abscess. Electronically Signed   By: Julian Hy M.D.   On: 04/22/2020 06:14   DG Chest Portable 1 View  Result Date: 04/17/2020 CLINICAL DATA:  Cough EXAM: PORTABLE CHEST 1 VIEW COMPARISON:  09/04/2019 chest radiograph and prior. FINDINGS: The heart size and mediastinal contours are within normal limits. Both lungs are clear. No pneumothorax or pleural effusion. No acute osseous abnormality. IMPRESSION: No focal airspace disease. Electronically Signed  By: Primitivo Gauze M.D.   On: 04/17/2020 14:52   MR ABDOMEN MRCP W WO CONTAST  Result Date: 04/22/2020 CLINICAL DATA:  Nausea/vomiting, fatty liver, dilated common bile duct on CT, hyperbilirubinemia EXAM: MRI ABDOMEN WITHOUT AND WITH CONTRAST (INCLUDING MRCP) TECHNIQUE: Multiplanar multisequence MR imaging of the abdomen was performed both before and after the administration of intravenous contrast. Heavily T2-weighted images of the biliary and pancreatic ducts were obtained, and three-dimensional MRCP images were rendered by post processing. CONTRAST:  60mL GADAVIST GADOBUTROL 1 MMOL/ML IV SOLN COMPARISON:  CT abdomen/pelvis dated 04/21/2020  FINDINGS: Lower chest: Small bilateral pleural effusions. Hepatobiliary: Severe hepatic steatosis with focal fatty sparing along the gallbladder fossa. No morphologic findings of cirrhosis. No suspicious/enhancing hepatic lesions. Gallbladder is unremarkable. No intrahepatic ductal dilatation. Mildly prominent common duct, measuring 9 mm (series 5/image 12), although smoothly tapering at the ampulla and similar dating back to multiple CTs and an MR in 2020. No choledocholithiasis is seen. Pancreas: Within normal limits. No peripancreatic inflammatory changes/fluid. Spleen:  Within normal limits. Adrenals/Urinary Tract:  Adrenal glands are within normal limits. Kidneys are within normal limits, noting very mild differential delayed enhancement in the left kidney, likely reflecting sequela of recent infection/pyelonephritis. No perirenal abscess. No hydronephrosis. Stomach/Bowel: Stomach is within normal limits. Visualized bowel is grossly unremarkable. Vascular/Lymphatic:  No evidence of abdominal aortic aneurysm. No suspicious abdominal lymphadenopathy. Other:  No abdominal ascites. Musculoskeletal: No focal osseous lesions. IMPRESSION: Mildly prominent common duct, measuring 9 mm. However, no cholelithiasis or choledocholithiasis is seen. This appearance is similar dating back to multiple CTs and MR in 2020. As such, this is not acute and of questionable clinical significance. Severe hepatic steatosis with focal fatty sparing. Very mild sequela of recent infection/pyelonephritis involving the left kidney. No perirenal abscess. Electronically Signed   By: Julian Hy M.D.   On: 04/22/2020 06:14   [place] EKG SITE RITE  Result Date: 04/21/2020 If Site Rite image not attached, placement could not be confirmed due to current cardiac rhythm.   Barton Dubois, MD [facility]  If 7PM-7AM, please contact night-coverage [email or web address] Password [facility] 04/22/2020, 6:25 PM   LOS: 5 days

## 2020-04-23 LAB — COMPREHENSIVE METABOLIC PANEL
ALT: 28 U/L (ref 0–44)
AST: 31 U/L (ref 15–41)
Albumin: 2.1 g/dL — ABNORMAL LOW (ref 3.5–5.0)
Alkaline Phosphatase: 92 U/L (ref 38–126)
Anion gap: 12 (ref 5–15)
BUN: 5 mg/dL — ABNORMAL LOW (ref 6–20)
CO2: 23 mmol/L (ref 22–32)
Calcium: 7.9 mg/dL — ABNORMAL LOW (ref 8.9–10.3)
Chloride: 100 mmol/L (ref 98–111)
Creatinine, Ser: 0.49 mg/dL (ref 0.44–1.00)
GFR calc Af Amer: >60 mL/min (ref >60)
GFR calc non Af Amer: >60 mL/min (ref >60)
Glucose, Bld: 81 mg/dL (ref 70–99)
Potassium: 3.9 mmol/L (ref 3.5–5.1)
Sodium: 135 mmol/L (ref 135–145)
Total Bilirubin: 1.2 mg/dL (ref 0.3–1.2)
Total Protein: 5.2 g/dL — ABNORMAL LOW (ref 6.5–8.1)

## 2020-04-23 LAB — CBC
HCT: 34.7 % — ABNORMAL LOW (ref 36.0–46.0)
Hemoglobin: 12 g/dL (ref 12.0–15.0)
MCH: 32.9 pg (ref 26.0–34.0)
MCHC: 34.6 g/dL (ref 30.0–36.0)
MCV: 95.1 fL (ref 80.0–100.0)
Platelets: 277 10*3/uL (ref 150–400)
RBC: 3.65 MIL/uL — ABNORMAL LOW (ref 3.87–5.11)
RDW: 16.5 % — ABNORMAL HIGH (ref 11.5–15.5)
WBC: 12.3 10*3/uL — ABNORMAL HIGH (ref 4.0–10.5)
nRBC: 0 % (ref 0.0–0.2)

## 2020-04-23 LAB — MAGNESIUM: Magnesium: 1.6 mg/dL — ABNORMAL LOW (ref 1.7–2.4)

## 2020-04-23 MED ORDER — NICOTINE 21 MG/24HR TD PT24: 21.0000 mg | MEDICATED_PATCH | Freq: Every day | TRANSDERMAL | 0 refills | Status: DC

## 2020-04-23 MED ORDER — ONDANSETRON 4 MG PO TBDP
4.0000 mg | ORAL_TABLET | Freq: Four times a day (QID) | ORAL | Status: DC | PRN
  Administered 2020-04-23: 4 mg via ORAL
  Filled 2020-04-23: qty 1

## 2020-04-23 MED ORDER — ONDANSETRON 4 MG PO TBDP: 4.0000 mg | ORAL_TABLET | Freq: Four times a day (QID) | ORAL | Status: DC

## 2020-04-23 MED ORDER — PROMETHAZINE HCL 25 MG PO TABS: 25.0000 mg | ORAL_TABLET | Freq: Three times a day (TID) | ORAL | 0 refills | Status: DC | PRN

## 2020-04-23 MED ORDER — AMOXICILLIN-POT CLAVULANATE 875-125 MG PO TABS
1.0000 | ORAL_TABLET | Freq: Two times a day (BID) | ORAL | Status: DC
  Administered 2020-04-23: 1 via ORAL
  Filled 2020-04-23: qty 1

## 2020-04-23 MED ORDER — ACETAMINOPHEN 325 MG PO TABS: 650.0000 mg | ORAL_TABLET | Freq: Four times a day (QID) | ORAL | 0 refills | Status: DC | PRN

## 2020-04-23 MED ORDER — FLUCONAZOLE 100 MG PO TABS: 100.0000 mg | ORAL_TABLET | Freq: Every day | ORAL | 0 refills | Status: AC

## 2020-04-23 MED ORDER — PANTOPRAZOLE SODIUM 40 MG PO TBEC
40.0000 mg | DELAYED_RELEASE_TABLET | Freq: Two times a day (BID) | ORAL | 1 refills | Status: DC
Start: 2020-04-23 — End: 2020-06-03

## 2020-04-23 MED ORDER — AMOXICILLIN-POT CLAVULANATE 875-125 MG PO TABS: 1.0000 | ORAL_TABLET | Freq: Two times a day (BID) | ORAL | 0 refills | Status: AC

## 2020-04-23 NOTE — Progress Notes (Signed)
PROGRESS NOTE  Olivia Lester OVZ:[id number] DOB: Mar 21, 1976 DOA: 04/17/2020 PCP: Patient, No Pcp Per  Brief History: 44 year old female with a history of polysubstance abuse including alcohol, cannabis, tobacco, pancreatic insufficiency, hepatitis C, anxiety/depression, chronic back pain, chronic pain syndrome, thrombocytopenia, hypertension, severe malnutrition presenting with 3-day history of nausea, vomiting with associated myalgias, arthralgias, and worsening abdominal pain. The patient states that her symptoms began on 04/14/2020. Unfortunately, she continues to drink 4 shots of liquor on a daily basis. She previously drank 1/5 on a daily basis. She states that her last drink was on the day of admission. However, it was noted that the patient had a bottle of liquor in the emergency department which she was consuming given to her medical care. The patient also complained of fevers up to 104.0 F on 04/16/2020. She denies any hematemesis, hematochezia, melena. She complains of some dysuria. In the emergency department, the patient was febrile up to 100.6 F with tachycardia. She was hemodynamically stable. Oxygen saturation was 95% on room air. BMP with sodium 132, potassium 2.9, serum creatinine 0.46. WBC 16.2 with hemoglobin 12.5 and platelets 172,000. The patient was started on ceftriaxone and alcohol withdrawal protocol.   Assessment/Plan: Severe Sepsis -present on admission -due to UTI and pneumonia -COVID--negative -continue IVF -lactic acid peaked 2.5 -continue antibiotics but will attempt oral route using augmentin -continues to have fevers daily -9/28 repeat CT abd--bilateral pyelonephritis unchanged, no abscess; increase CBD caliber; no cholecystitis -MRCP -9/28 CTA chest--no PE; bilateral effusion with LLL consolidation and mosaic attenuation upper lobe -repeat blood cultures without growth.  Pyelonephritis--EColi -UA>50 WBC. -04/17/2020 CT  abdomen-heterogeneous left renal enhancement with a striated nephrogram. There was perinephric stranding on the left without fluid collection. There is slight enhancement of the right upper kidney. -04/21/20 repeat CT abd--no abscess; pyelonephritis unchanged -Continue abx using augmentin  -Based on culture; Microorganism and is pansensitive.  Acute metabolic encephalopathy -Secondary to sepsis and alcohol -Mental status improved and back to baseline.   -Continue to follow clinical response. -Constant reorientation and supportive care will be provided.  Intractable nausea and vomiting -In part due to alcoholic gastritis and cannabis hyperemesis syndrome -Continue PPI -Continue IV fluids -Slowly advancing diet. -Continue as needed antiemetics.  Polysubstance abuse -Including tobacco, alcohol, and cannabis -Extensive cessation counseling provided -Continue nicoderm patch -continue CIWA -no acute withdrawal appreciated.  Transaminitis -In the setting of alcohol abuse and sepsis -LFTs downtrending and improving continuously. -Continue to follow trend intermittently. -Patient advised to stop alcohol consumption.  Essential hypertension -Continue holding amlodipine secondary to soft blood pressure -Follow vital signs.  Hypokalemia/Hypomagnesemia -Continue to follow electrolytes trend and further replete as needed.  Chronic thrombocytopenia -from EtOH abuseand sepsis, monitor -No signs of overt bleeding -Follow platelets count.  Loose stools -likely due to pancreatic insufficiency -check cdiff--no more diarrhea -Continue advancing diet.  Chest pain -likely due to vomiting -atypical by history -cycle troponins51>>41 -personally reviewed CXR--no edema or consolidation -D-dimer-6.20 -personally reviewed EKG--sinus, no STT changes -CTA chest--no PE -Continue supportive care and oral analgesics.   Status is: Inpatient  Remains inpatient appropriate  because:Hemodynamically unstable, Unsafe d/c plan and IV treatments appropriate due to intensity of illness or inability to take PO   Dispo: The patient is from:Home Anticipated d/c is AJ:OINO Anticipated d/c date is: 04/24/20 Patient currently is not medically stable to d/c.  Continue supportive care; and slowly advance diet to full there and transition pain medications and antibiotics to oral route;  if able to tolerate it will discharge home in a.m.     Family Communication:No family at bedside.  Consultants:none  Code Status: FULL   DVT Prophylaxis: [place] Heparin   Procedures: As Listed in Progress Note Above  Antibiotics: Ceftriaxone 9/24>>>9/27 Cefepime 9/28>>9/30 azithro 9/28>>9/30   Subjective: Continue complaining of lower abdominal pain and back pain.  Currently afebrile for 24 hours.  One episode of nausea/vomiting.  Still willing to have diet advance and transition medications to oral regimen.  Objective: Vitals:   04/22/20 1512 04/23/20 0500 04/23/20 0600 04/23/20 1343  BP: 127/83  (!) 162/99 131/88  Pulse: 80  79 83  Resp: 16  18 19   Temp: 98.5 F (36.9 C)  98.1 F (36.7 C) 98.3 F (36.8 C)  TempSrc: Oral  Oral Oral  SpO2: 100%  99% 100%  Weight:  59.7 kg    Height:        Intake/Output Summary (Last 24 hours) at 04/23/2020 1414 Last data filed at 04/23/2020 0900 Gross per 24 hour  Intake 2302.83 ml  Output --  Net 2302.83 ml   Weight change: -2.961 kg  Exam: General exam: Alert, awake, oriented x 3; afebrile now for 24 hours and expressing no chest pain or shortness of breath.  Still having back pain and lower abdominal discomfort.  Patient with one episode of nausea/vomiting. Respiratory system: Good oxygen saturation on room air; no using accessory muscle.  No wheezing, normal respiratory effort.  Positive scattered rhonchi on auscultation. Cardiovascular system:RRR. No murmurs, rubs,  gallops.  No JVD. Gastrointestinal system: Abdomen is soft, no guarding, mild lower abdominal discomfort on deep palpation.  Mild distention appreciated.  Positive bowel sounds.   Central nervous system: Alert and oriented. No focal neurological deficits. Extremities: No cyanosis or clubbing. Skin: No rashes, no petechiae. Psychiatry: Judgement and insight appear normal. Mood & affect appropriate.    Data Reviewed: I have personally reviewed following labs and imaging studies  Basic Metabolic Panel: Recent Labs  Lab 04/18/20 0604 04/19/20 0638 04/20/20 0606 04/21/20 0322 04/23/20 0702  NA 130* 131* 135 133* 135  K 2.3* 3.5 4.7 4.3 3.9  CL 90* 98 103 102 100  CO2 28 25 23  21* 23  GLUCOSE 112* 97 79 72 81  BUN <5* <5* <5* 5* 5*  CREATININE 0.47 0.49 0.46 0.48 0.49  CALCIUM 7.0* 7.1* 8.0* 8.5* 7.9*  MG 1.0* 1.6* 1.9 1.6* 1.6*   Liver Function Tests: Recent Labs  Lab 04/18/20 0604 04/19/20 0638 04/20/20 0606 04/21/20 0322 04/23/20 0702  AST 99* 134* 122* 64* 31  ALT 48* 63* 69* 56* 28  ALKPHOS 121 106 117 117 92  BILITOT 1.1 1.3* 1.7* 1.4* 1.2  PROT 4.8* 4.6* 5.5* 5.7* 5.2*  ALBUMIN 1.9* 1.9* 2.2* 2.2* 2.1*   Recent Labs  Lab 04/17/20 1453  LIPASE 16   Recent Labs  Lab 04/17/20 1453  AMMONIA 30   Coagulation Profile: Recent Labs  Lab 04/17/20 1922  INR 1.2   CBC: Recent Labs  Lab 04/17/20 1400 04/17/20 1400 04/18/20 0604 04/19/20 2297 04/20/20 0606 04/21/20 0322 04/23/20 0702  WBC 16.2*   < > 17.3* 12.8* 13.7* 15.6* 12.3*  NEUTROABS 13.5*  --   --  10.4*  --  11.1*  --   HGB 12.5   < > 10.4* 10.2* 11.7* 12.2 12.0  HCT 37.4   < > 29.6* 29.6* 35.4* 37.1 34.7*  MCV 98.4   < > 94.9 97.0 98.6 98.7 95.1  PLT  191   < > 172 146* 168 215 277   < > = values in this interval not displayed.   Urine analysis:    Component Value Date/Time   COLORURINE YELLOW 04/21/2020 1630   APPEARANCEUR CLEAR 04/21/2020 1630   APPEARANCEUR Clear 12/07/2013 0133    LABSPEC 1.031 (H) 04/21/2020 1630   LABSPEC 1.006 12/07/2013 0133   PHURINE 6.0 04/21/2020 1630   GLUCOSEU NEGATIVE 04/21/2020 1630   GLUCOSEU Negative 12/07/2013 0133   HGBUR SMALL (A) 04/21/2020 1630   BILIRUBINUR NEGATIVE 04/21/2020 1630   BILIRUBINUR Negative 12/07/2013 0133   KETONESUR 20 (A) 04/21/2020 1630   PROTEINUR NEGATIVE 04/21/2020 1630   UROBILINOGEN 0.2 04/28/2015 1559   NITRITE NEGATIVE 04/21/2020 1630   LEUKOCYTESUR NEGATIVE 04/21/2020 1630   LEUKOCYTESUR Negative 12/07/2013 0133    Recent Results (from the past 240 hour(s))  Resp Panel by RT PCR (RSV, Flu A&B, Covid) - Nasopharyngeal Swab     Status: None   Collection Time: 04/17/20  1:15 PM   Specimen: Nasopharyngeal Swab  Result Value Ref Range Status   SARS Coronavirus 2 by RT PCR NEGATIVE NEGATIVE Final    Comment: (NOTE) SARS-CoV-2 target nucleic acids are NOT DETECTED.  The SARS-CoV-2 RNA is generally detectable in upper respiratoy specimens during the acute phase of infection. The lowest concentration of SARS-CoV-2 viral copies this assay can detect is 131 copies/mL. A negative result does not preclude SARS-Cov-2 infection and should not be used as the sole basis for treatment or other patient management decisions. A negative result may occur with  improper specimen collection/handling, submission of specimen other than nasopharyngeal swab, presence of viral mutation(s) within the areas targeted by this assay, and inadequate number of viral copies (<131 copies/mL). A negative result must be combined with clinical observations, patient history, and epidemiological information. The expected result is Negative.  Fact Sheet for Patients:  PinkCheek.be  Fact Sheet for Healthcare Providers:  GravelBags.it  This test is no t yet approved or cleared by the [place] FDA and  has been authorized for detection and/or diagnosis of SARS-CoV-2  by FDA under an Emergency Use Authorization (EUA). This EUA will remain  in effect (meaning this test can be used) for the duration of the COVID-19 declaration under Section 564(b)(1) of the Act, 21 U.S.C. section 360bbb-3(b)(1), unless the authorization is terminated or revoked sooner.     Influenza A by PCR NEGATIVE NEGATIVE Final   Influenza B by PCR NEGATIVE NEGATIVE Final    Comment: (NOTE) The Xpert Xpress SARS-CoV-2/FLU/RSV assay is intended as an aid in  the diagnosis of influenza from Nasopharyngeal swab specimens and  should not be used as a sole basis for treatment. Nasal washings and  aspirates are unacceptable for Xpert Xpress SARS-CoV-2/FLU/RSV  testing.  Fact Sheet for Patients: PinkCheek.be  Fact Sheet for Healthcare Providers: GravelBags.it  This test is not yet approved or cleared by the [place] FDA and  has been authorized for detection and/or diagnosis of SARS-CoV-2 by  FDA under an Emergency Use Authorization (EUA). This EUA will remain  in effect (meaning this test can be used) for the duration of the  Covid-19 declaration under Section 564(b)(1) of the Act, 21  U.S.C. section 360bbb-3(b)(1), unless the authorization is  terminated or revoked.    Respiratory Syncytial Virus by PCR NEGATIVE NEGATIVE Final    Comment: (NOTE) Fact Sheet for Patients: PinkCheek.be  Fact Sheet for Healthcare Providers: GravelBags.it  This test is not yet approved or cleared  by the [place] and  has been authorized for detection and/or diagnosis of SARS-CoV-2 by  FDA under an Emergency Use Authorization (EUA). This EUA will remain  in effect (meaning this test can be used) for the duration of the  COVID-19 declaration under Section 564(b)(1) of the Act, 21 U.S.C.  section 360bbb-3(b)(1), unless the authorization is terminated or   revoked. Performed at [facility], [address] [place], [place] [address]   Urine culture     Status: Abnormal   Collection Time: 04/17/20  3:57 PM   Specimen: In/Out Cath Urine  Result Value Ref Range Status   Specimen Description   Final    IN/OUT CATH URINE Performed at [facility], [address], [place], [place] [address]    Special Requests   Final    NONE Performed at [facility], [address] [place], [place] [address]    Culture >=100,000 COLONIES/mL ESCHERICHIA COLI (A)  Final   Report Status 04/20/2020 FINAL  Final   Organism ID, Bacteria ESCHERICHIA COLI (A)  Final      Susceptibility   Escherichia coli - MIC*    AMPICILLIN <=2 SENSITIVE Sensitive     CEFAZOLIN <=4 SENSITIVE Sensitive     CEFTRIAXONE <=0.25 SENSITIVE Sensitive     CIPROFLOXACIN <=0.25 SENSITIVE Sensitive     GENTAMICIN <=1 SENSITIVE Sensitive     IMIPENEM <=0.25 SENSITIVE Sensitive     NITROFURANTOIN <=16 SENSITIVE Sensitive     TRIMETH/SULFA <=20 SENSITIVE Sensitive     AMPICILLIN/SULBACTAM <=2 SENSITIVE Sensitive     PIP/TAZO <=4 SENSITIVE Sensitive     * >=100,000 COLONIES/mL ESCHERICHIA COLI  Blood Culture (routine x 2)     Status: None   Collection Time: 04/17/20  4:11 PM   Specimen: BLOOD RIGHT HAND  Result Value Ref Range Status   Specimen Description BLOOD RIGHT HAND  Final   Special Requests   Final    BOTTLES DRAWN AEROBIC ONLY Blood Culture results may not be optimal due to an inadequate volume of blood received in culture bottles   Culture   Final    NO GROWTH 5 DAYS Performed at [facility], [address], [place], [place] [address]    Report Status 04/22/2020 FINAL  Final  Blood Culture (routine x 2)     Status: None   Collection Time: 04/17/20  7:22 PM   Specimen: Left Antecubital; Blood  Result Value Ref Range Status   Specimen Description LEFT ANTECUBITAL  Final   Special Requests   Final    BOTTLES DRAWN AEROBIC ONLY Blood Culture results may not be optimal due  to an inadequate volume of blood received in culture bottles   Culture   Final    NO GROWTH 5 DAYS Performed at [facility], [address] [place], [place] [address]    Report Status 04/22/2020 FINAL  Final  MRSA PCR Screening     Status: None   Collection Time: 04/18/20  5:26 PM   Specimen: Nasal Mucosa; Nasopharyngeal  Result Value Ref Range Status   MRSA by PCR NEGATIVE NEGATIVE Final    Comment:        The GeneXpert MRSA Assay (FDA approved for NASAL specimens only), is one component of a comprehensive MRSA colonization surveillance program. It is not intended to diagnose MRSA infection nor to guide or monitor treatment for MRSA infections. Performed at [facility], [address] [place], [place] [address]   Culture, blood (Routine X 2) w  Reflex to ID Panel     Status: None (Preliminary result)   Collection Time: 04/21/20  4:09 PM   Specimen: BLOOD RIGHT FOREARM  Result Value Ref Range Status   Specimen Description BLOOD RIGHT FOREARM  Final   Special Requests   Final    BOTTLES DRAWN AEROBIC ONLY Blood Culture adequate volume   Culture   Final    NO GROWTH 2 DAYS Performed at [facility], [address] [place], [place] [address]    Report Status PENDING  Incomplete  Culture, blood (Routine X 2) w Reflex to ID Panel     Status: None (Preliminary result)   Collection Time: 04/21/20  4:16 PM   Specimen: BLOOD RIGHT HAND  Result Value Ref Range Status   Specimen Description BLOOD RIGHT HAND  Final   Special Requests   Final    BOTTLES DRAWN AEROBIC ONLY Blood Culture adequate volume   Culture   Final    NO GROWTH 2 DAYS Performed at [facility], [address], [place], [place] [address]    Report Status PENDING  Incomplete  Culture, Urine     Status: Abnormal   Collection Time: 04/21/20  4:30 PM   Specimen: Urine, Random  Result Value Ref Range Status   Specimen Description   Final    URINE, RANDOM Performed at [facility], [address],  [place], [place] [address]    Special Requests   Final    NONE Performed at [facility], [address] [place], [place] [address]    Culture (A)  Final    <10,000 COLONIES/mL INSIGNIFICANT GROWTH Performed at [facility], [place] [address], [place], [place] [address]    Report Status 04/22/2020 FINAL  Final     Scheduled Meds: . amoxicillin-clavulanate  1 tablet Oral Q12H  . Chlorhexidine Gluconate Cloth  6 each Topical Daily  . famotidine  20 mg Oral QHS  . folic acid  1 mg Oral Daily  . heparin  5,000 Units Subcutaneous Q8H  . multivitamin with minerals  1 tablet Oral Daily  . nicotine  21 mg Transdermal Daily  . pantoprazole  40 mg Oral BID AC  . thiamine  100 mg Oral Daily   Continuous Infusions: . lactated ringers 125 mL/hr at 04/22/20 0240    Procedures/Studies: CT ANGIO CHEST PE W OR WO CONTRAST  Result Date: 04/21/2020 CLINICAL DATA:  Shortness of breath with chest pain and elevated D-dimer EXAM: CT ANGIOGRAPHY CHEST WITH CONTRAST TECHNIQUE: Multidetector CT imaging of the chest was performed using the standard protocol during bolus administration of intravenous contrast. Multiplanar CT image reconstructions and MIPs were obtained to evaluate the vascular anatomy. CONTRAST:  2mL OMNIPAQUE IOHEXOL 350 MG/ML SOLN COMPARISON:  Chest radiograph April 17, 2020 FINDINGS: Cardiovascular: There is no demonstrable pulmonary embolus. There is no thoracic aortic aneurysm or dissection. Visualized great vessels appear normal. There is a small pericardial effusion. No pericardial thickening evident. Mediastinum/Nodes: Thyroid appears unremarkable. No evident thoracic adenopathy. No esophageal lesions are appreciable. Lungs/Pleura: There are moderate free-flowing pleural effusions bilaterally. There is patchy airspace opacity in the left base with compressive atelectasis in the right base. There is slight mosaic attenuation in the upper lobe regions. Areas of atelectatic change noted in  the lingula. Upper Abdomen: Diffuse decreased attenuation of the liver is indicative of hepatic steatosis. Visualized upper abdominal structures otherwise appear unremarkable. Musculoskeletal: No blastic or lytic bone lesions. No evident chest wall lesions. Review of the MIP images  confirms the above findings. IMPRESSION: 1. No demonstrable pulmonary embolus. No thoracic aortic aneurysm or dissection. 2.  Small pericardial effusion. 3. Sizable pleural effusions bilaterally with compressive atelectasis in the lung bases as well as an area felt to represent superimposed pneumonia in the lateral left base. Areas of atelectasis in the lingula noted. 4. Somewhat mosaic attenuation in the upper lobes may indicate underlying small airways obstructive disease. This somewhat ill-defined opacity can be seen with atypical organism pneumonia. Correlation with COVID-19 status advised in this regard. 5.  No evident adenopathy. 6.  Hepatic steatosis. Electronically Signed   By: Lowella Grip III M.D.   On: 04/21/2020 12:55   CT ABDOMEN PELVIS W CONTRAST  Result Date: 04/21/2020 CLINICAL DATA:  History of persistent fever, pyelonephritis EXAM: CT ABDOMEN AND PELVIS WITH CONTRAST TECHNIQUE: Multidetector CT imaging of the abdomen and pelvis was performed using the standard protocol following bolus administration of intravenous contrast. CONTRAST:  57mL OMNIPAQUE IOHEXOL 300 MG/ML  SOLN COMPARISON:  April 17, 2020 FINDINGS: Lower chest: Small to moderate bilateral pleural effusions, layering dependently within the visualized portions of the chest and associated with basilar volume loss. These are new compared to previous imaging. Hepatobiliary: Severe hepatic steatosis, no focal, suspicious hepatic lesion. No pericholecystic stranding. Common bile duct with slight increase in size compared to previous studies though similar to the recent prior, maximal caliber approximately 6-7 mm. Pancreas: Pancreas without  peripancreatic fluid or ductal dilation. Spleen: Spleen normal in size and contour. Adrenals/Urinary Tract: Adrenal glands are normal. Signs of bilateral pyelonephritis worse on the LEFT. No hydronephrosis. Stomach/Bowel: Normal appendix. Stomach is under distended limiting assessment. Small bowel without dilation or perienteric stranding. No acute gastrointestinal process. Vascular/Lymphatic: Calcified and noncalcified atheromatous plaque of the abdominal aorta. Numerous scattered retroperitoneal lymph nodes, slightly increased since July of 2021 and stable since the most recent comparison of September of 2021, largest approximately 8-9 mm along the LEFT periaortic chain. No pelvic adenopathy Reproductive: Small RIGHT ovarian dermoid with similar appearance. Other: Body wall edema increased from the previous exam. No free air. The small volume pelvic ascites similar to the previous study. Musculoskeletal: No acute bone finding. No destructive bone process. Spinal degenerative changes. L4-5 spinal fusion as before. IMPRESSION: 1. Signs of bilateral pyelonephritis worse on the LEFT. Findings are similar to previous imaging and not associated with abscess or hydronephrosis. 2. Increased caliber of the common bile duct as compared to more remote studies with similar appearance compared to the most recent comparison. Correlate with biliary enzymes and with liver enzymes with MRCP as warranted. No gallbladder distension or pericholecystic stranding. 3. Worsening anasarca with increased body wall edema and development of bilateral effusions. 4. Severe hepatic steatosis. 5. Numerous scattered retroperitoneal lymph nodes, slightly increased since July of 2021 and stable since the most recent comparison of September of 2021 and stable since the most recent comparison of September of 2021, largest approximately 8-9 mm along the LEFT periaortic chain. These are likely reactive. 6. Small RIGHT ovarian dermoid with similar  appearance. 7. Aortic atherosclerosis. Aortic Atherosclerosis (ICD10-I70.0). Electronically Signed   By: Zetta Bills M.D.   On: 04/21/2020 10:31   CT ABDOMEN PELVIS W CONTRAST  Result Date: 04/17/2020 CLINICAL DATA:  Acute nonlocalized abdominal pain. Fever and malaise. Abdominal swelling. EXAM: CT ABDOMEN AND PELVIS WITH CONTRAST TECHNIQUE: Multidetector CT imaging of the abdomen and pelvis was performed using the standard protocol following bolus administration of intravenous contrast. CONTRAST:  160mL OMNIPAQUE IOHEXOL 300 MG/ML  SOLN  COMPARISON:  Most recent CT 01/26/2020 FINDINGS: Lower chest: No pleural fluid or focal airspace disease. The heart is normal in size. Hepatobiliary: Again seen advanced hepatic steatosis. Mildly enlarged liver spanning 21 cm cranial caudal. No evidence of focal hepatic lesion. Patent portal vein. Pancreas: Ill-defined fat planes adjacent to the pancreatic head. No acute peripancreatic collection. No ductal dilatation. Spleen: Normal in size without focal abnormality. Adrenals/Urinary Tract: No adrenal nodule. Diffusely heterogeneous left renal enhancement with striated nephrogram in areas of diminished perfusion involving the upper, mid lower kidney. There is left perinephric edema. No evidence of perirenal fluid collection. No renal or ureteral calculi. Slight heterogeneous enhancement of the upper right kidney. Minimal adjacent perinephric edema. The urinary bladder is physiologically distended without wall thickening. Stomach/Bowel: Bowel evaluation is limited in the absence of enteric contrast as well as mild motion artifact. Decompressed stomach. Normal positioning of the duodenum and ligament of Treitz. There is no small bowel obstruction or inflammatory change. Normal appendix. Small volume of colonic stool. There is no colonic wall thickening or inflammation. Vascular/Lymphatic: Moderate aorto bi-iliac atherosclerosis, advanced for age. No aortic aneurysm. The portal  vein is patent. There multiple prominent retroperitoneal lymph nodes are borderline enlarged. Prominent central mesenteric nodes. The renal arteries appear patent. Reproductive: Free fluid adjacent to the uterus which is otherwise unremarkable. 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 Other: Small amount of free fluid in the pelvis. There is no upper abdominal ascites. No abdominal wall hernia. There is generalized body wall edema. Musculoskeletal: Posterior lumbar fusion with intact hardware. There are no acute or suspicious osseous abnormalities. IMPRESSION: 1. Heterogeneous left renal enhancement with striated nephrogram and areas of diminished perfusion involving the upper, mid lower kidney, consistent with pyelonephritis. No perirenal fluid collection. Findings also suspicious for pyelonephritis of the upper pole of the right kidney. 2. Ill-defined fat planes adjacent to the pancreatic head, may represent acute pancreatitis. Recommend correlation with pancreatic enzymes. 3. Advanced hepatic steatosis and mild hepatomegaly. 4. 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 5. Small amount of free fluid in the pelvis. Generalized body wall edema. 6. Aortic atherosclerosis is advanced for age. Aortic Atherosclerosis (ICD10-I70.0). Electronically Signed   By: Keith Rake M.D.   On: 04/17/2020 19:03   MR 3D Recon At Scanner  Result Date: 04/22/2020 CLINICAL DATA:  Nausea/vomiting, fatty liver, dilated common bile duct on CT, hyperbilirubinemia EXAM: MRI ABDOMEN WITHOUT AND WITH CONTRAST (INCLUDING MRCP) TECHNIQUE: Multiplanar multisequence MR imaging of the abdomen was performed both before and after the administration of intravenous contrast. Heavily T2-weighted images of the biliary and pancreatic ducts were obtained, and three-dimensional MRCP images were rendered by post processing. CONTRAST:  61mL GADAVIST GADOBUTROL 1 MMOL/ML IV SOLN COMPARISON:  CT abdomen/pelvis dated 04/21/2020 FINDINGS: Lower chest: Small  bilateral pleural effusions. Hepatobiliary: Severe hepatic steatosis with focal fatty sparing along the gallbladder fossa. No morphologic findings of cirrhosis. No suspicious/enhancing hepatic lesions. Gallbladder is unremarkable. No intrahepatic ductal dilatation. Mildly prominent common duct, measuring 9 mm (series 5/image 12), although smoothly tapering at the ampulla and similar dating back to multiple CTs and an MR in 2020. No choledocholithiasis is seen. Pancreas: Within normal limits. No peripancreatic inflammatory changes/fluid. Spleen:  Within normal limits. Adrenals/Urinary Tract:  Adrenal glands are within normal limits. Kidneys are within normal limits, noting very mild differential delayed enhancement in the left kidney, likely reflecting sequela of recent infection/pyelonephritis. No perirenal abscess. No hydronephrosis. Stomach/Bowel: Stomach is within normal limits. Visualized bowel is grossly unremarkable. Vascular/Lymphatic:  No evidence  of abdominal aortic aneurysm. No suspicious abdominal lymphadenopathy. Other:  No abdominal ascites. Musculoskeletal: No focal osseous lesions. IMPRESSION: Mildly prominent common duct, measuring 9 mm. However, no cholelithiasis or choledocholithiasis is seen. This appearance is similar dating back to multiple CTs and MR in 2020. As such, this is not acute and of questionable clinical significance. Severe hepatic steatosis with focal fatty sparing. Very mild sequela of recent infection/pyelonephritis involving the left kidney. No perirenal abscess. Electronically Signed   By: Julian Hy M.D.   On: 04/22/2020 06:14   DG Chest Portable 1 View  Result Date: 04/17/2020 CLINICAL DATA:  Cough EXAM: PORTABLE CHEST 1 VIEW COMPARISON:  09/04/2019 chest radiograph and prior. FINDINGS: The heart size and mediastinal contours are within normal limits. Both lungs are clear. No pneumothorax or pleural effusion. No acute osseous abnormality. IMPRESSION: No focal  airspace disease. Electronically Signed   By: Primitivo Gauze M.D.   On: 04/17/2020 14:52   MR ABDOMEN MRCP W WO CONTAST  Result Date: 04/22/2020 CLINICAL DATA:  Nausea/vomiting, fatty liver, dilated common bile duct on CT, hyperbilirubinemia EXAM: MRI ABDOMEN WITHOUT AND WITH CONTRAST (INCLUDING MRCP) TECHNIQUE: Multiplanar multisequence MR imaging of the abdomen was performed both before and after the administration of intravenous contrast. Heavily T2-weighted images of the biliary and pancreatic ducts were obtained, and three-dimensional MRCP images were rendered by post processing. CONTRAST:  51mL GADAVIST GADOBUTROL 1 MMOL/ML IV SOLN COMPARISON:  CT abdomen/pelvis dated 04/21/2020 FINDINGS: Lower chest: Small bilateral pleural effusions. Hepatobiliary: Severe hepatic steatosis with focal fatty sparing along the gallbladder fossa. No morphologic findings of cirrhosis. No suspicious/enhancing hepatic lesions. Gallbladder is unremarkable. No intrahepatic ductal dilatation. Mildly prominent common duct, measuring 9 mm (series 5/image 12), although smoothly tapering at the ampulla and similar dating back to multiple CTs and an MR in 2020. No choledocholithiasis is seen. Pancreas: Within normal limits. No peripancreatic inflammatory changes/fluid. Spleen:  Within normal limits. Adrenals/Urinary Tract:  Adrenal glands are within normal limits. Kidneys are within normal limits, noting very mild differential delayed enhancement in the left kidney, likely reflecting sequela of recent infection/pyelonephritis. No perirenal abscess. No hydronephrosis. Stomach/Bowel: Stomach is within normal limits. Visualized bowel is grossly unremarkable. Vascular/Lymphatic:  No evidence of abdominal aortic aneurysm. No suspicious abdominal lymphadenopathy. Other:  No abdominal ascites. Musculoskeletal: No focal osseous lesions. IMPRESSION: Mildly prominent common duct, measuring 9 mm. However, no cholelithiasis or  choledocholithiasis is seen. This appearance is similar dating back to multiple CTs and MR in 2020. As such, this is not acute and of questionable clinical significance. Severe hepatic steatosis with focal fatty sparing. Very mild sequela of recent infection/pyelonephritis involving the left kidney. No perirenal abscess. Electronically Signed   By: Julian Hy M.D.   On: 04/22/2020 06:14   [place] EKG SITE RITE  Result Date: 04/21/2020 If Site Rite image not attached, placement could not be confirmed due to current cardiac rhythm.   Barton Dubois, MD [facility]  If 7PM-7AM, please contact night-coverage [email or web address] Password [email or web address] 04/23/2020, 2:14 PM   LOS: 6 days

## 2020-04-23 NOTE — Plan of Care (Signed)
Pt discharged home.

## 2020-04-23 NOTE — Discharge Summary (Signed)
Physician Discharge Summary  Olivia Lester SNK:[id number] DOB: 20-Sep-1975 DOA: 04/17/2020  PCP: Patient, No Pcp Per  Admit date: 04/17/2020 Discharge date: 04/23/2020  Time spent: 35 minutes  Recommendations for Outpatient Follow-up:  1. Repeat complete metabolic panel to follow electrolytes, renal function and LFTs 2. Continue assisting patient with tobacco alcohol cessation 3. Repeat CBC to assure complete resolution WBCs and to follow stability of patient's hemoglobin and platelets count. 4. Reassess patient blood pressure and further adjust antihypertensive regimen as required.   Discharge Diagnoses:  Active Problems:   Thrombocytopenia (HCC)   Tobacco dependence   Elevated transaminase level   Alcohol abuse   Cannabis abuse   Hypokalemia   Alcoholic gastritis   Hypomagnesemia   Sepsis ([place])   Acute pyelonephritis   Sepsis due to Escherichia coli (E. coli) ([place])   Discharge Condition: Stable and improved.  Discharge with instruction to establish care and follow-up with PCP in 10 days.  CODE STATUS full code.  Diet recommendation: Low-fat/heart healthy diet.  Filed Weights   04/21/20 0500 04/22/20 0605 04/23/20 0500  Weight: 65.5 kg 62.7 kg 59.7 kg    History of present illness:  44 year old female with a history of polysubstance abuse including alcohol, cannabis, tobacco, pancreatic insufficiency, hepatitis C, anxiety/depression, chronic back pain, chronic pain syndrome, thrombocytopenia, hypertension, severe malnutrition presenting with 3-day history of nausea, vomiting with associated myalgias, arthralgias, and worsening abdominal pain. The patient states that her symptoms began on 04/14/2020. Unfortunately, she continues to drink 4 shots of liquor on a daily basis. She previously drank 1/5 on a daily basis. She states that her last drink was on the day of admission. However, it was noted that the patient had a bottle of liquor in the emergency department which she was  consuming given to her medical care. The patient also complained of fevers up to 104.0 F on 04/16/2020. She denies any hematemesis, hematochezia, melena. She complains of some dysuria. In the emergency department, the patient was febrile up to 100.6 F with tachycardia. She was hemodynamically stable. Oxygen saturation was 95% on room air. BMP with sodium 132, potassium 2.9, serum creatinine 0.46. WBC 16.2 with hemoglobin 12.5 and platelets 172,000. The patient was started on ceftriaxone and alcohol withdrawal protocol.  Hospital Course:  Severe Sepsis -present on admission -due to UTI and pneumonia -COVID--negative -continue IVF -lactic acid peaked 2.5 -continue antibiotics but will attempt oral route using augmentin -Patient is afebrile and essentially with normal WBCs at discharge. -9/28 repeat CT abd--bilateral pyelonephritis unchanged, no abscess; increase CBD caliber; no cholecystitis -MRCP -9/28 CTA chest--no PE; bilateral effusion with LLL consolidation and mosaic attenuation upper lobe -repeat blood cultures without growth.  Pyelonephritis--EColi -UA>50 WBC. -04/17/2020 CT abdomen-heterogeneous left renal enhancement with a striated nephrogram. There was perinephric stranding on the left without fluid collection. There is slight enhancement of the right upper kidney. -04/21/20 repeat CT abd--no abscess; pyelonephritis unchanged -Continue abx using augmentin  and complete 4 more days. -Based on culture; Microorganism is pansensitive.  Acute metabolic encephalopathy -Secondary to sepsis and alcohol -Mental status improved and back to baseline at time of discharge.    Intractable nausea and vomiting -In part due to alcoholic gastritisand cannabis hyperemesis syndrome -Continue PPI -Continue to maintain adequate hydration and continue the use of as needed antiemetics.  Polysubstance abuse -Including tobacco, alcohol, and cannabis -Extensive cessation counseling  provided -Continue nicoderm patch -continue folic acid and thiamine. -no acute withdrawal appreciated at discharge..  Transaminitis -In the setting of alcohol abuse  and sepsis -LFTs downtrended and WNL at discharge -Repeat at follow up visit to assess stability Continue to follow trend intermittently. -Patient advised to stop alcohol consumption.  Yeast infection -Most likely triggered by the use of antibiotics. -Will provide treatment with Diflucan.  Essential hypertension -stable. -will resume amlodipine at discharge  Hypokalemia/Hypomagnesemia -Continue to follow electrolytes trend and further replete as needed.  Chronic thrombocytopenia -from EtOH abuseand sepsis, monitor -No signs of overt bleeding -Follow platelets count with repeat CBC at follow up visit..  Loose stools -likely due to pancreatic insufficiency -checked cdiff neg; no more diarrhea at discharge -Patient Instructed to resume use of lipase. -maintain adequate hydration   Chest pain -likely due to vomiting -atypical by history -cycle [id number] -personally reviewed CXR--no edema or consolidation -D-dimer-6.20 -personally reviewed EKG--sinus, no STT changes -CTA chest--no PE -Continue supportive care and oral analgesics.  Procedures:  See below for x-ray reports.   Consultations:  None   Discharge Exam: Vitals:   04/23/20 0600 04/23/20 1343  BP: (!) 162/99 131/88  Pulse: 79 83  Resp: 18 19  Temp: 98.1 F (36.7 C) 98.3 F (36.8 C)  SpO2: 99% 100%   General exam: Alert, awake, oriented x 3; afebrile now for 24 hours and expressing no chest pain or shortness of breath. Still having back pain and lower abdominal discomfort.  Patient with no further nausea/vomiting throughout the day and wanting to go home.  Respiratory system: Good oxygen saturation on room air; no using accessory muscle.  No wheezing, normal respiratory effort.  Positive scattered rhonchi on  auscultation. Cardiovascular system:RRR. No murmurs, rubs, gallops.  No JVD. Gastrointestinal system: Abdomen is soft, no guarding, mild lower abdominal discomfort on deep palpation.  Mild distention appreciated.  Positive bowel sounds.   Central nervous system: Alert and oriented. No focal neurological deficits. Extremities: No cyanosis or clubbing. Skin: No rashes, no petechiae. Psychiatry: Judgement and insight appear normal. Mood & affect appropriate.   Discharge Instructions   Discharge Instructions    Diet - low sodium heart healthy   Complete by: As directed    Discharge instructions   Complete by: As directed    Take medications as prescribed. Stop alcohol consumption and stop smoking. Maintain adequate hydration. Low-fat diet. Arrange follow-up to establish care with PCP and follow improvement in your conditions in the next 10 days.   Increase activity slowly   Complete by: As directed      Allergies as of 04/23/2020      Reactions   Toradol [ketorolac Tromethamine] Other (See Comments)   Patient advised not to take due to ulcers   Asa [aspirin] Rash      Medication List    STOP taking these medications   chlordiazePOXIDE 25 MG capsule Commonly known as: LIBRIUM   diphenhydrAMINE 25 mg capsule Commonly known as: BENADRYL   ondansetron 4 MG disintegrating tablet Commonly known as: Zofran ODT   promethazine 25 MG tablet Commonly known as: PHENERGAN     TAKE these medications   acetaminophen 325 MG tablet Commonly known as: TYLENOL Take 2 tablets (650 mg total) by mouth every 6 (six) hours as needed for mild pain or headache (or Fever >/= 101).   amLODipine 5 MG tablet Commonly known as: NORVASC Take 1 tablet (5 mg total) by mouth daily.   amoxicillin-clavulanate 875-125 MG tablet Commonly known as: AUGMENTIN Take 1 tablet by mouth every 12 (twelve) hours for 5 days.   fluconazole 100 MG tablet Commonly known as: Diflucan Take  1 tablet (100 mg  total) by mouth daily.   folic acid 1 MG tablet Commonly known as: FOLVITE Take 1 tablet (1 mg total) by mouth daily.   multivitamin with minerals Tabs tablet Take 1 tablet by mouth every other day.   naproxen sodium 220 MG tablet Commonly known as: ALEVE Take 440 mg by mouth daily as needed.   nicotine 21 mg/24hr patch Commonly known as: NICODERM CQ - dosed in mg/24 hours Place 1 patch (21 mg total) onto the skin daily. Start taking on: April 24, 2020   Pancrelipase (Lip-Prot-Amyl) 24000-76000 units Cpep Take 1 capsule (24,000 Units total) by mouth 3 (three) times daily before meals.   pantoprazole 40 MG tablet Commonly known as: PROTONIX Take 1 tablet (40 mg total) by mouth 2 (two) times daily. What changed: when to take this   permethrin 5 % cream Commonly known as: ELIMITE Apply from neck down, leaving on for 10 hours then shower.  Repeat in 10 days if symptoms persist   thiamine 100 MG tablet Take 1 tablet (100 mg total) by mouth daily.      Allergies  Allergen Reactions  . Toradol [Ketorolac Tromethamine] Other (See Comments)    Patient advised not to take due to ulcers  . Asa [Aspirin] Rash    The results of significant diagnostics from this hospitalization (including imaging, microbiology, ancillary and laboratory) are listed below for reference.    Significant Diagnostic Studies: CT ANGIO CHEST PE W OR WO CONTRAST  Result Date: 04/21/2020 CLINICAL DATA:  Shortness of breath with chest pain and elevated D-dimer EXAM: CT ANGIOGRAPHY CHEST WITH CONTRAST TECHNIQUE: Multidetector CT imaging of the chest was performed using the standard protocol during bolus administration of intravenous contrast. Multiplanar CT image reconstructions and MIPs were obtained to evaluate the vascular anatomy. CONTRAST:  82mL OMNIPAQUE IOHEXOL 350 MG/ML SOLN COMPARISON:  Chest radiograph April 17, 2020 FINDINGS: Cardiovascular: There is no demonstrable pulmonary embolus. There is no  thoracic aortic aneurysm or dissection. Visualized great vessels appear normal. There is a small pericardial effusion. No pericardial thickening evident. Mediastinum/Nodes: Thyroid appears unremarkable. No evident thoracic adenopathy. No esophageal lesions are appreciable. Lungs/Pleura: There are moderate free-flowing pleural effusions bilaterally. There is patchy airspace opacity in the left base with compressive atelectasis in the right base. There is slight mosaic attenuation in the upper lobe regions. Areas of atelectatic change noted in the lingula. Upper Abdomen: Diffuse decreased attenuation of the liver is indicative of hepatic steatosis. Visualized upper abdominal structures otherwise appear unremarkable. Musculoskeletal: No blastic or lytic bone lesions. No evident chest wall lesions. Review of the MIP images confirms the above findings. IMPRESSION: 1. No demonstrable pulmonary embolus. No thoracic aortic aneurysm or dissection. 2.  Small pericardial effusion. 3. Sizable pleural effusions bilaterally with compressive atelectasis in the lung bases as well as an area felt to represent superimposed pneumonia in the lateral left base. Areas of atelectasis in the lingula noted. 4. Somewhat mosaic attenuation in the upper lobes may indicate underlying small airways obstructive disease. This somewhat ill-defined opacity can be seen with atypical organism pneumonia. Correlation with COVID-19 status advised in this regard. 5.  No evident adenopathy. 6.  Hepatic steatosis. Electronically Signed   By: Lowella Grip III M.D.   On: 04/21/2020 12:55   CT ABDOMEN PELVIS W CONTRAST  Result Date: 04/21/2020 CLINICAL DATA:  History of persistent fever, pyelonephritis EXAM: CT ABDOMEN AND PELVIS WITH CONTRAST TECHNIQUE: Multidetector CT imaging of the abdomen and pelvis was performed  using the standard protocol following bolus administration of intravenous contrast. CONTRAST:  38mL OMNIPAQUE IOHEXOL 300 MG/ML  SOLN  COMPARISON:  April 17, 2020 FINDINGS: Lower chest: Small to moderate bilateral pleural effusions, layering dependently within the visualized portions of the chest and associated with basilar volume loss. These are new compared to previous imaging. Hepatobiliary: Severe hepatic steatosis, no focal, suspicious hepatic lesion. No pericholecystic stranding. Common bile duct with slight increase in size compared to previous studies though similar to the recent prior, maximal caliber approximately 6-7 mm. Pancreas: Pancreas without peripancreatic fluid or ductal dilation. Spleen: Spleen normal in size and contour. Adrenals/Urinary Tract: Adrenal glands are normal. Signs of bilateral pyelonephritis worse on the LEFT. No hydronephrosis. Stomach/Bowel: Normal appendix. Stomach is under distended limiting assessment. Small bowel without dilation or perienteric stranding. No acute gastrointestinal process. Vascular/Lymphatic: Calcified and noncalcified atheromatous plaque of the abdominal aorta. Numerous scattered retroperitoneal lymph nodes, slightly increased since July of 2021 and stable since the most recent comparison of September of 2021, largest approximately 8-9 mm along the LEFT periaortic chain. No pelvic adenopathy Reproductive: Small RIGHT ovarian dermoid with similar appearance. Other: Body wall edema increased from the previous exam. No free air. The small volume pelvic ascites similar to the previous study. Musculoskeletal: No acute bone finding. No destructive bone process. Spinal degenerative changes. L4-5 spinal fusion as before. IMPRESSION: 1. Signs of bilateral pyelonephritis worse on the LEFT. Findings are similar to previous imaging and not associated with abscess or hydronephrosis. 2. Increased caliber of the common bile duct as compared to more remote studies with similar appearance compared to the most recent comparison. Correlate with biliary enzymes and with liver enzymes with MRCP as warranted.  No gallbladder distension or pericholecystic stranding. 3. Worsening anasarca with increased body wall edema and development of bilateral effusions. 4. Severe hepatic steatosis. 5. Numerous scattered retroperitoneal lymph nodes, slightly increased since July of 2021 and stable since the most recent comparison of September of 2021 and stable since the most recent comparison of September of 2021, largest approximately 8-9 mm along the LEFT periaortic chain. These are likely reactive. 6. Small RIGHT ovarian dermoid with similar appearance. 7. Aortic atherosclerosis. Aortic Atherosclerosis (ICD10-I70.0). Electronically Signed   By: Zetta Bills M.D.   On: 04/21/2020 10:31   CT ABDOMEN PELVIS W CONTRAST  Result Date: 04/17/2020 CLINICAL DATA:  Acute nonlocalized abdominal pain. Fever and malaise. Abdominal swelling. EXAM: CT ABDOMEN AND PELVIS WITH CONTRAST TECHNIQUE: Multidetector CT imaging of the abdomen and pelvis was performed using the standard protocol following bolus administration of intravenous contrast. CONTRAST:  134mL OMNIPAQUE IOHEXOL 300 MG/ML  SOLN COMPARISON:  Most recent CT 01/26/2020 FINDINGS: Lower chest: No pleural fluid or focal airspace disease. The heart is normal in size. Hepatobiliary: Again seen advanced hepatic steatosis. Mildly enlarged liver spanning 21 cm cranial caudal. No evidence of focal hepatic lesion. Patent portal vein. Pancreas: Ill-defined fat planes adjacent to the pancreatic head. No acute peripancreatic collection. No ductal dilatation. Spleen: Normal in size without focal abnormality. Adrenals/Urinary Tract: No adrenal nodule. Diffusely heterogeneous left renal enhancement with striated nephrogram in areas of diminished perfusion involving the upper, mid lower kidney. There is left perinephric edema. No evidence of perirenal fluid collection. No renal or ureteral calculi. Slight heterogeneous enhancement of the upper right kidney. Minimal adjacent perinephric edema. The  urinary bladder is physiologically distended without wall thickening. Stomach/Bowel: Bowel evaluation is limited in the absence of enteric contrast as well as mild motion artifact. Decompressed stomach. Normal  positioning of the duodenum and ligament of Treitz. There is no small bowel obstruction or inflammatory change. Normal appendix. Small volume of colonic stool. There is no colonic wall thickening or inflammation. Vascular/Lymphatic: Moderate aorto bi-iliac atherosclerosis, advanced for age. No aortic aneurysm. The portal vein is patent. There multiple prominent retroperitoneal lymph nodes are borderline enlarged. Prominent central mesenteric nodes. The renal arteries appear patent. Reproductive: Free fluid adjacent to the uterus which is otherwise unremarkable. 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 Other: Small amount of free fluid in the pelvis. There is no upper abdominal ascites. No abdominal wall hernia. There is generalized body wall edema. Musculoskeletal: Posterior lumbar fusion with intact hardware. There are no acute or suspicious osseous abnormalities. IMPRESSION: 1. Heterogeneous left renal enhancement with striated nephrogram and areas of diminished perfusion involving the upper, mid lower kidney, consistent with pyelonephritis. No perirenal fluid collection. Findings also suspicious for pyelonephritis of the upper pole of the right kidney. 2. Ill-defined fat planes adjacent to the pancreatic head, may represent acute pancreatitis. Recommend correlation with pancreatic enzymes. 3. Advanced hepatic steatosis and mild hepatomegaly. 4. 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 5. Small amount of free fluid in the pelvis. Generalized body wall edema. 6. Aortic atherosclerosis is advanced for age. Aortic Atherosclerosis (ICD10-I70.0). Electronically Signed   By: Keith Rake M.D.   On: 04/17/2020 19:03   MR 3D Recon At Scanner  Result Date: 04/22/2020 CLINICAL DATA:  Nausea/vomiting, fatty liver,  dilated common bile duct on CT, hyperbilirubinemia EXAM: MRI ABDOMEN WITHOUT AND WITH CONTRAST (INCLUDING MRCP) TECHNIQUE: Multiplanar multisequence MR imaging of the abdomen was performed both before and after the administration of intravenous contrast. Heavily T2-weighted images of the biliary and pancreatic ducts were obtained, and three-dimensional MRCP images were rendered by post processing. CONTRAST:  26mL GADAVIST GADOBUTROL 1 MMOL/ML IV SOLN COMPARISON:  CT abdomen/pelvis dated 04/21/2020 FINDINGS: Lower chest: Small bilateral pleural effusions. Hepatobiliary: Severe hepatic steatosis with focal fatty sparing along the gallbladder fossa. No morphologic findings of cirrhosis. No suspicious/enhancing hepatic lesions. Gallbladder is unremarkable. No intrahepatic ductal dilatation. Mildly prominent common duct, measuring 9 mm (series 5/image 12), although smoothly tapering at the ampulla and similar dating back to multiple CTs and an MR in 2020. No choledocholithiasis is seen. Pancreas: Within normal limits. No peripancreatic inflammatory changes/fluid. Spleen:  Within normal limits. Adrenals/Urinary Tract:  Adrenal glands are within normal limits. Kidneys are within normal limits, noting very mild differential delayed enhancement in the left kidney, likely reflecting sequela of recent infection/pyelonephritis. No perirenal abscess. No hydronephrosis. Stomach/Bowel: Stomach is within normal limits. Visualized bowel is grossly unremarkable. Vascular/Lymphatic:  No evidence of abdominal aortic aneurysm. No suspicious abdominal lymphadenopathy. Other:  No abdominal ascites. Musculoskeletal: No focal osseous lesions. IMPRESSION: Mildly prominent common duct, measuring 9 mm. However, no cholelithiasis or choledocholithiasis is seen. This appearance is similar dating back to multiple CTs and MR in 2020. As such, this is not acute and of questionable clinical significance. Severe hepatic steatosis with focal fatty  sparing. Very mild sequela of recent infection/pyelonephritis involving the left kidney. No perirenal abscess. Electronically Signed   By: Julian Hy M.D.   On: 04/22/2020 06:14   DG Chest Portable 1 View  Result Date: 04/17/2020 CLINICAL DATA:  Cough EXAM: PORTABLE CHEST 1 VIEW COMPARISON:  09/04/2019 chest radiograph and prior. FINDINGS: The heart size and mediastinal contours are within normal limits. Both lungs are clear. No pneumothorax or pleural effusion. No acute osseous abnormality. IMPRESSION: No focal airspace disease. Electronically Signed  By: Primitivo Gauze M.D.   On: 04/17/2020 14:52   MR ABDOMEN MRCP W WO CONTAST  Result Date: 04/22/2020 CLINICAL DATA:  Nausea/vomiting, fatty liver, dilated common bile duct on CT, hyperbilirubinemia EXAM: MRI ABDOMEN WITHOUT AND WITH CONTRAST (INCLUDING MRCP) TECHNIQUE: Multiplanar multisequence MR imaging of the abdomen was performed both before and after the administration of intravenous contrast. Heavily T2-weighted images of the biliary and pancreatic ducts were obtained, and three-dimensional MRCP images were rendered by post processing. CONTRAST:  60mL GADAVIST GADOBUTROL 1 MMOL/ML IV SOLN COMPARISON:  CT abdomen/pelvis dated 04/21/2020 FINDINGS: Lower chest: Small bilateral pleural effusions. Hepatobiliary: Severe hepatic steatosis with focal fatty sparing along the gallbladder fossa. No morphologic findings of cirrhosis. No suspicious/enhancing hepatic lesions. Gallbladder is unremarkable. No intrahepatic ductal dilatation. Mildly prominent common duct, measuring 9 mm (series 5/image 12), although smoothly tapering at the ampulla and similar dating back to multiple CTs and an MR in 2020. No choledocholithiasis is seen. Pancreas: Within normal limits. No peripancreatic inflammatory changes/fluid. Spleen:  Within normal limits. Adrenals/Urinary Tract:  Adrenal glands are within normal limits. Kidneys are within normal limits, noting very  mild differential delayed enhancement in the left kidney, likely reflecting sequela of recent infection/pyelonephritis. No perirenal abscess. No hydronephrosis. Stomach/Bowel: Stomach is within normal limits. Visualized bowel is grossly unremarkable. Vascular/Lymphatic:  No evidence of abdominal aortic aneurysm. No suspicious abdominal lymphadenopathy. Other:  No abdominal ascites. Musculoskeletal: No focal osseous lesions. IMPRESSION: Mildly prominent common duct, measuring 9 mm. However, no cholelithiasis or choledocholithiasis is seen. This appearance is similar dating back to multiple CTs and MR in 2020. As such, this is not acute and of questionable clinical significance. Severe hepatic steatosis with focal fatty sparing. Very mild sequela of recent infection/pyelonephritis involving the left kidney. No perirenal abscess. Electronically Signed   By: Julian Hy M.D.   On: 04/22/2020 06:14   [place] EKG SITE RITE  Result Date: 04/21/2020 If Site Rite image not attached, placement could not be confirmed due to current cardiac rhythm.   Microbiology: Recent Results (from the past 240 hour(s))  Resp Panel by RT PCR (RSV, Flu A&B, Covid) - Nasopharyngeal Swab     Status: None   Collection Time: 04/17/20  1:15 PM   Specimen: Nasopharyngeal Swab  Result Value Ref Range Status   SARS Coronavirus 2 by RT PCR NEGATIVE NEGATIVE Final    Comment: (NOTE) SARS-CoV-2 target nucleic acids are NOT DETECTED.  The SARS-CoV-2 RNA is generally detectable in upper respiratoy specimens during the acute phase of infection. The lowest concentration of SARS-CoV-2 viral copies this assay can detect is 131 copies/mL. A negative result does not preclude SARS-Cov-2 infection and should not be used as the sole basis for treatment or other patient management decisions. A negative result may occur with  improper specimen collection/handling, submission of specimen other than nasopharyngeal swab, presence of viral  mutation(s) within the areas targeted by this assay, and inadequate number of viral copies (<131 copies/mL). A negative result must be combined with clinical observations, patient history, and epidemiological information. The expected result is Negative.  Fact Sheet for Patients:  PinkCheek.be  Fact Sheet for Healthcare Providers:  GravelBags.it  This test is no t yet approved or cleared by the [place] FDA and  has been authorized for detection and/or diagnosis of SARS-CoV-2 by FDA under an Emergency Use Authorization (EUA). This EUA will remain  in effect (meaning this test can be used) for the duration of the COVID-19 declaration under Section 564(b)(1)  of the Act, 21 U.S.C. section 360bbb-3(b)(1), unless the authorization is terminated or revoked sooner.     Influenza A by PCR NEGATIVE NEGATIVE Final   Influenza B by PCR NEGATIVE NEGATIVE Final    Comment: (NOTE) The Xpert Xpress SARS-CoV-2/FLU/RSV assay is intended as an aid in  the diagnosis of influenza from Nasopharyngeal swab specimens and  should not be used as a sole basis for treatment. Nasal washings and  aspirates are unacceptable for Xpert Xpress SARS-CoV-2/FLU/RSV  testing.  Fact Sheet for Patients: PinkCheek.be  Fact Sheet for Healthcare Providers: GravelBags.it  This test is not yet approved or cleared by the [place] FDA and  has been authorized for detection and/or diagnosis of SARS-CoV-2 by  FDA under an Emergency Use Authorization (EUA). This EUA will remain  in effect (meaning this test can be used) for the duration of the  Covid-19 declaration under Section 564(b)(1) of the Act, 21  U.S.C. section 360bbb-3(b)(1), unless the authorization is  terminated or revoked.    Respiratory Syncytial Virus by PCR NEGATIVE NEGATIVE Final    Comment: (NOTE) Fact Sheet for  Patients: PinkCheek.be  Fact Sheet for Healthcare Providers: GravelBags.it  This test is not yet approved or cleared by the [place] FDA and  has been authorized for detection and/or diagnosis of SARS-CoV-2 by  FDA under an Emergency Use Authorization (EUA). This EUA will remain  in effect (meaning this test can be used) for the duration of the  COVID-19 declaration under Section 564(b)(1) of the Act, 21 U.S.C.  section 360bbb-3(b)(1), unless the authorization is terminated or  revoked. Performed at [facility], [address], [place], [place] [address]   Urine culture     Status: Abnormal   Collection Time: 04/17/20  3:57 PM   Specimen: In/Out Cath Urine  Result Value Ref Range Status   Specimen Description   Final    IN/OUT CATH URINE Performed at [facility], [address] [place], [place] [address]    Special Requests   Final    NONE Performed at [facility], [address], [place], [place] [address]    Culture >=100,000 COLONIES/mL ESCHERICHIA COLI (A)  Final   Report Status 04/20/2020 FINAL  Final   Organism ID, Bacteria ESCHERICHIA COLI (A)  Final      Susceptibility   Escherichia coli - MIC*    AMPICILLIN <=2 SENSITIVE Sensitive     CEFAZOLIN <=4 SENSITIVE Sensitive     CEFTRIAXONE <=0.25 SENSITIVE Sensitive     CIPROFLOXACIN <=0.25 SENSITIVE Sensitive     GENTAMICIN <=1 SENSITIVE Sensitive     IMIPENEM <=0.25 SENSITIVE Sensitive     NITROFURANTOIN <=16 SENSITIVE Sensitive     TRIMETH/SULFA <=20 SENSITIVE Sensitive     AMPICILLIN/SULBACTAM <=2 SENSITIVE Sensitive     PIP/TAZO <=4 SENSITIVE Sensitive     * >=100,000 COLONIES/mL ESCHERICHIA COLI  Blood Culture (routine x 2)     Status: None   Collection Time: 04/17/20  4:11 PM   Specimen: BLOOD RIGHT HAND  Result Value Ref Range Status   Specimen Description BLOOD RIGHT HAND  Final   Special Requests   Final    BOTTLES DRAWN AEROBIC ONLY Blood  Culture results may not be optimal due to an inadequate volume of blood received in culture bottles   Culture   Final    NO GROWTH 5 DAYS Performed at [facility], [address] [place], [place] [address]    Report Status 04/22/2020 FINAL  Final  Blood Culture (routine x 2)     Status: None   Collection Time: 04/17/20  7:22 PM   Specimen: Left Antecubital; Blood  Result Value Ref Range Status   Specimen Description LEFT ANTECUBITAL  Final   Special Requests   Final    BOTTLES DRAWN AEROBIC ONLY Blood Culture results may not be optimal due to an inadequate volume of blood received in culture bottles   Culture   Final    NO GROWTH 5 DAYS Performed at [facility], [address], [place], [place] [address]    Report Status 04/22/2020 FINAL  Final  MRSA PCR Screening     Status: None   Collection Time: 04/18/20  5:26 PM   Specimen: Nasal Mucosa; Nasopharyngeal  Result Value Ref Range Status   MRSA by PCR NEGATIVE NEGATIVE Final    Comment:        The GeneXpert MRSA Assay (FDA approved for NASAL specimens only), is one component of a comprehensive MRSA colonization surveillance program. It is not intended to diagnose MRSA infection nor to guide or monitor treatment for MRSA infections. Performed at [facility], [address], [place], [place] [address]   Culture, blood (Routine X 2) w Reflex to ID Panel     Status: None (Preliminary result)   Collection Time: 04/21/20  4:09 PM   Specimen: BLOOD RIGHT FOREARM  Result Value Ref Range Status   Specimen Description BLOOD RIGHT FOREARM  Final   Special Requests   Final    BOTTLES DRAWN AEROBIC ONLY Blood Culture adequate volume   Culture   Final    NO GROWTH 2 DAYS Performed at [facility], [address] [place], [place] [address]    Report Status PENDING  Incomplete  Culture, blood (Routine X 2) w Reflex to ID Panel     Status: None (Preliminary result)   Collection Time: 04/21/20  4:16 PM   Specimen: BLOOD RIGHT HAND   Result Value Ref Range Status   Specimen Description BLOOD RIGHT HAND  Final   Special Requests   Final    BOTTLES DRAWN AEROBIC ONLY Blood Culture adequate volume   Culture   Final    NO GROWTH 2 DAYS Performed at [facility], [address] [place], [place] [address]    Report Status PENDING  Incomplete  Culture, Urine     Status: Abnormal   Collection Time: 04/21/20  4:30 PM   Specimen: Urine, Random  Result Value Ref Range Status   Specimen Description   Final    URINE, RANDOM Performed at [facility], [address] [place], [place] [address]    Special Requests   Final    NONE Performed at [facility], [address] [place], [place] [address]    Culture (A)  Final    <10,000 COLONIES/mL INSIGNIFICANT GROWTH Performed at [facility], [place] [address] [place],  [address]    Report Status 04/22/2020 FINAL  Final     Labs: Basic Metabolic Panel: Recent Labs  Lab 04/18/20 0604 04/19/20 8850 04/20/20 0606 04/21/20 0322 04/23/20 0702  NA 130* 131* 135 133* 135  K 2.3* 3.5 4.7 4.3 3.9  CL 90* 98 103 102 100  CO2 28 25 23  21* 23  GLUCOSE 112* 97 79 72 81  BUN <5* <5* <5* 5* 5*  CREATININE 0.47 0.49 0.46 0.48 0.49  CALCIUM 7.0* 7.1* 8.0* 8.5* 7.9*  MG 1.0* 1.6* 1.9 1.6* 1.6*   Liver Function Tests: Recent  Labs  Lab 04/18/20 0604 04/19/20 4268 04/20/20 0606 04/21/20 0322 04/23/20 0702  AST 99* 134* 122* 64* 31  ALT 48* 63* 69* 56* 28  ALKPHOS 121 106 117 117 92  BILITOT 1.1 1.3* 1.7* 1.4* 1.2  PROT 4.8* 4.6* 5.5* 5.7* 5.2*  ALBUMIN 1.9* 1.9* 2.2* 2.2* 2.1*   Recent Labs  Lab 04/17/20 1453  LIPASE 16   Recent Labs  Lab 04/17/20 1453  AMMONIA 30   CBC: Recent Labs  Lab 04/17/20 1400 04/17/20 1400 04/18/20 0604 04/19/20 3419 04/20/20 0606 04/21/20 0322 04/23/20 0702  WBC 16.2*   < > 17.3* 12.8* 13.7* 15.6* 12.3*  NEUTROABS 13.5*  --   --  10.4*  --  11.1*  --   HGB 12.5   < > 10.4* 10.2* 11.7* 12.2 12.0  HCT 37.4   < >  29.6* 29.6* 35.4* 37.1 34.7*  MCV 98.4   < > 94.9 97.0 98.6 98.7 95.1  PLT 191   < > 172 146* 168 215 277   < > = values in this interval not displayed.   Signed:  Barton Dubois MD.  [facility] 04/23/2020, 3:23 PM

## 2020-04-23 NOTE — TOC Progression Note (Addendum)
Transition of Care [facility]) - Progression Note    Patient Details  Name: Olivia Lester MRN: [id number] Date of Birth: January 24, 1976  Transition of Care [facility] CM/SW [place], [place] Phone Number: 04/23/2020, 12:43 PM  Clinical Narrative:    CSW attempted to provide pt with SA resources. Pt stated CSW could not come in the room, as she was attemtping to get some sleep. CSW will provide pts RN with resources to add to pts discharge paperwork. TOC to follow.  Addendum 3:37pm: TOC received consult from MD regarding pt having no insurance, no PCP, and need for SA resources. CSW sent referral to the financial counselor. CSW spoke to representative, they informed CSW that pt has recently become inactive with [facility] and they have recently attempted to reach the pt to avail. CSW gave them pts current mobile number on file, [facility] will follow up and attempt to reach pt. CSW delivered SA resources to third floor nurses station, to be given to pts RN due to pt being unwilling to speak to TOC. TOC to follow.     Barriers to Discharge: Continued Medical Work up, Inadequate or no insurance  Expected Discharge Plan and Services                                                 Social Determinants of Health (SDOH) Interventions    Readmission Risk Interventions No flowsheet data found.

## 2020-04-26 LAB — CULTURE, BLOOD (ROUTINE X 2)
Culture: NO GROWTH
Culture: NO GROWTH
Special Requests: ADEQUATE
Special Requests: ADEQUATE

## 2020-04-30 ENCOUNTER — Other Ambulatory Visit: Payer: Self-pay

## 2020-04-30 ENCOUNTER — Emergency Department (HOSPITAL_COMMUNITY): Payer: Self-pay

## 2020-04-30 ENCOUNTER — Emergency Department (HOSPITAL_COMMUNITY)
Admission: EM | Admit: 2020-04-30 | Discharge: 2020-04-30 | Disposition: A | Discharge status: Home / Self Care | Payer: Self-pay | Attending: Emergency Medicine | Admitting: Emergency Medicine

## 2020-04-30 ENCOUNTER — Encounter (HOSPITAL_COMMUNITY): Payer: Self-pay | Admitting: Emergency Medicine

## 2020-04-30 DIAGNOSIS — M545 Low back pain, unspecified: Secondary | ICD-10-CM | POA: Insufficient documentation

## 2020-04-30 DIAGNOSIS — Z20822 Contact with and (suspected) exposure to covid-19: Secondary | ICD-10-CM | POA: Insufficient documentation

## 2020-04-30 DIAGNOSIS — R509 Fever, unspecified: Secondary | ICD-10-CM | POA: Insufficient documentation

## 2020-04-30 DIAGNOSIS — I10 Essential (primary) hypertension: Secondary | ICD-10-CM | POA: Insufficient documentation

## 2020-04-30 DIAGNOSIS — F17210 Nicotine dependence, cigarettes, uncomplicated: Secondary | ICD-10-CM | POA: Insufficient documentation

## 2020-04-30 LAB — COMPREHENSIVE METABOLIC PANEL
ALT: 15 U/L (ref 0–44)
AST: 27 U/L (ref 15–41)
Albumin: 2.8 g/dL — ABNORMAL LOW (ref 3.5–5.0)
Alkaline Phosphatase: 94 U/L (ref 38–126)
Anion gap: 12 (ref 5–15)
BUN: <5 mg/dL — ABNORMAL LOW (ref 6–20)
CO2: 27 mmol/L (ref 22–32)
Calcium: 9.1 mg/dL (ref 8.9–10.3)
Chloride: 100 mmol/L (ref 98–111)
Creatinine, Ser: 0.58 mg/dL (ref 0.44–1.00)
GFR calc non Af Amer: >60 mL/min (ref >60)
Glucose, Bld: 117 mg/dL — ABNORMAL HIGH (ref 70–99)
Potassium: 3.6 mmol/L (ref 3.5–5.1)
Sodium: 139 mmol/L (ref 135–145)
Total Bilirubin: 0.7 mg/dL (ref 0.3–1.2)
Total Protein: 6.1 g/dL — ABNORMAL LOW (ref 6.5–8.1)

## 2020-04-30 LAB — URINALYSIS, ROUTINE W REFLEX MICROSCOPIC
Bilirubin Urine: NEGATIVE
Glucose, UA: NEGATIVE mg/dL
Hgb urine dipstick: NEGATIVE
Ketones, ur: NEGATIVE mg/dL
Nitrite: NEGATIVE
Protein, ur: NEGATIVE mg/dL
Specific Gravity, Urine: 1.01 (ref 1.005–1.030)
pH: 7 (ref 5.0–8.0)

## 2020-04-30 LAB — CBG MONITORING, ED: Glucose-Capillary: 114 mg/dL — ABNORMAL HIGH (ref 70–99)

## 2020-04-30 LAB — CBC
HCT: 37.5 % (ref 36.0–46.0)
Hemoglobin: 12.3 g/dL (ref 12.0–15.0)
MCH: 33.2 pg (ref 26.0–34.0)
MCHC: 32.8 g/dL (ref 30.0–36.0)
MCV: 101.4 fL — ABNORMAL HIGH (ref 80.0–100.0)
Platelets: 511 10*3/uL — ABNORMAL HIGH (ref 150–400)
RBC: 3.7 MIL/uL — ABNORMAL LOW (ref 3.87–5.11)
RDW: 18.5 % — ABNORMAL HIGH (ref 11.5–15.5)
WBC: 8.6 10*3/uL (ref 4.0–10.5)
nRBC: 0 % (ref 0.0–0.2)

## 2020-04-30 LAB — RESPIRATORY PANEL BY RT PCR (FLU A&B, COVID)
Influenza A by PCR: NEGATIVE
Influenza B by PCR: NEGATIVE
SARS Coronavirus 2 by RT PCR: NEGATIVE

## 2020-04-30 LAB — I-STAT BETA HCG BLOOD, ED (MC, WL, AP ONLY): I-stat hCG, quantitative: <5 m[IU]/mL (ref <5)

## 2020-04-30 LAB — LACTIC ACID, PLASMA: Lactic Acid, Venous: 1 mmol/L (ref 0.5–1.9)

## 2020-04-30 LAB — TROPONIN I (HIGH SENSITIVITY)
Troponin I (High Sensitivity): 18 ng/L — ABNORMAL HIGH (ref <18)
Troponin I (High Sensitivity): 16 ng/L (ref <18)

## 2020-04-30 MED ORDER — ACETAMINOPHEN 325 MG PO TABS
650.0000 mg | ORAL_TABLET | Freq: Once | ORAL | Status: AC
  Administered 2020-04-30: 650 mg via ORAL
  Filled 2020-04-30: qty 2

## 2020-04-30 MED ORDER — SODIUM CHLORIDE 0.9 % IV BOLUS
1000.0000 mL | Freq: Once | INTRAVENOUS | Status: AC
  Administered 2020-04-30: 1000 mL via INTRAVENOUS

## 2020-04-30 MED ORDER — ONDANSETRON HCL 4 MG/2ML IJ SOLN
4.0000 mg | Freq: Once | INTRAMUSCULAR | Status: AC
  Administered 2020-04-30: 4 mg via INTRAVENOUS
  Filled 2020-04-30: qty 2

## 2020-04-30 NOTE — ED Provider Notes (Signed)
[facility] EMERGENCY DEPARTMENT Provider Note   CSN: [id number] Arrival date & time: 04/30/20  1025     History Chief Complaint  Patient presents with   Shortness of Breath   Back Pain    Olivia Lester is a 44 y.o. female with PMH significant for polysubstance abuse including alcohol and cannabis, hepatitis C, chronic back pain and chronic pain syndrome, HTN, and thrombocytopenia who was recently admitted to the ER on 04/17/2020 for severe sepsis in the context of pyelonephritis and pneumonia who returns to the ED with complaints of continued low back pain and shortness of breath symptoms.  On my examination, she states that when she was discharged she was feeling improved, however her fevers returned the subsequent day, on 04/25/2020.  Since then, she has been experiencing progressively worsening chest pain, shortness of breath, and low back pain bilaterally.  She also is endorsing headache and a dry, nonproductive cough.  No history of clots or clotting disorder.  No hemoptysis.  She does endorse pleuritic symptoms and states that her chest pain is worse with lying flat and deep inspiration.  She denies any abdominal pain, but does feel as though she is "fluid overloaded".  She has bruising on her stomach from the heparin injections while admitted inpatient.  She also has rash on her feet and left forearm that she states is due to poison oak.  She states that she has not had anything to drink or smoke since discharge from the hospital and adamantly denies any other illicit drug use or IVDA.  HPI     Past Medical History:  Diagnosis Date   Alcohol abuse    Back injury    Gastric ulcer    Gastric ulcer 10/14/2013   Hepatitis C 10/14/2013   History of kidney stones    Nonunion of foot fracture    right 5th metatarsal   Pancreatitis, acute    Panic attack    Pneumonia    Polysubstance abuse ([place])    PONV (postoperative nausea and vomiting)    Seizures  ([place])     Patient Active Problem List   Diagnosis Date Noted   Sepsis due to Escherichia coli (E. coli) ([place]) 04/20/2020   Acute pyelonephritis 04/18/2020   Sepsis ([place]) 04/17/2020   Fatty liver, alcoholic/Severe hepatic steatosis 01/29/2020   Pancreatitis, recurrent 01/26/2020   Colitis 10/28/2019   Polysubstance abuse ([place]) 10/28/2019   Hypomagnesemia 47/82/9562   Alcoholic hepatitis without ascites--Severe hepatic steatosis 13/02/6577   Alcoholic gastritis 46/96/2952   DTs (delirium tremens) ([place])    Urinary tract infection without hematuria    Alcohol intoxication ([place]) 09/04/2019   Alcohol withdrawal ([place]) 06/24/2019   Sinus tachycardia 06/24/2019   Essential hypertension 06/24/2019   Hypokalemia 03/31/2019   Cannabis abuse 03/11/2019   Elevated transaminase level    Alcohol abuse    Closed displaced fracture of fifth metatarsal bone of right foot 08/02/2018   Dental abscess 12/25/2017   Gastric ulcer 10/14/2013   Transaminitis 10/14/2013   Tobacco dependence 10/14/2013   Hepatitis C 10/14/2013   Thrombocytopenia ([place]) 10/13/2013   Duodenitis 10/12/2013   Alcoholism ([place]) 10/12/2013   Abdominal pain, epigastric 10/12/2013   ANKLE PAIN 01/01/2008    Past Surgical History:  Procedure Laterality Date   BACK SURGERY     BREAST SURGERY     ESOPHAGOGASTRODUODENOSCOPY (EGD) WITH PROPOFOL N/A 10/14/2013   Procedure: ESOPHAGOGASTRODUODENOSCOPY (EGD) WITH PROPOFOL;  Surgeon: Rogene Houston, MD;  Location: [facility];  Service:  Endoscopy;  Laterality: N/A;   ORIF TOE FRACTURE Right 08/08/2018   Procedure: RIGHT FOOT INTERNAL FIXATION 5TH METATARSAL;  Surgeon: Newt Minion, MD;  Location: [facility];  Service: Orthopedics;  Laterality: Right;   TONSILLECTOMY       OB History    Gravida  7   Para  3   Term  3   Preterm      AB  4   Living  3     SAB  2   TAB  2   Ectopic      Multiple      Live Births  3            Family History  Problem Relation Age of Onset   Cancer Mother 64       ovarian    Hypertension Mother    Stroke Mother    Early death Father    Alcohol abuse Father     Social History   Tobacco Use   Smoking status: Current Every Day Smoker    Packs/day: 1.00    Years: 20.00    Pack years: 20.00    Types: Cigarettes   Smokeless tobacco: Never Used  Vaping Use   Vaping Use: Never used  Substance Use Topics   Alcohol use: Yes    Alcohol/week: 18.0 standard drinks    Types: 18 Cans of beer per week    Comment: 1/5 -1/2 gallon of liqour daily or 18 beers daily   Drug use: Yes    Types: Marijuana, Cocaine, Methamphetamines    Comment: denies current cocaine/meth...current mj    Home Medications Prior to Admission medications   Medication Sig Start Date End Date Taking? Authorizing Provider  acetaminophen (TYLENOL) 325 MG tablet Take 2 tablets (650 mg total) by mouth every 6 (six) hours as needed for mild pain or headache (or Fever >/= 101). 04/23/20   Barton Dubois, MD  amLODipine (NORVASC) 5 MG tablet Take 1 tablet (5 mg total) by mouth daily. Patient not taking: Reported on 01/26/2020 10/28/19   Roxan Hockey, MD  fluconazole (DIFLUCAN) 100 MG tablet Take 1 tablet (100 mg total) by mouth daily. 04/23/20 05/23/20  Barton Dubois, MD  folic acid (FOLVITE) 1 MG tablet Take 1 tablet (1 mg total) by mouth daily. Patient not taking: Reported on 01/07/2020 10/29/19   Roxan Hockey, MD  lipase/protease/amylase 24000-76000 units CPEP Take 1 capsule (24,000 Units total) by mouth 3 (three) times daily before meals. Patient not taking: Reported on 01/07/2020 10/29/19   Roxan Hockey, MD  Multiple Vitamin (MULTIVITAMIN WITH MINERALS) TABS tablet Take 1 tablet by mouth every other day.  Patient not taking: Reported on 01/07/2020    [provider]  naproxen sodium (ALEVE) 220 MG tablet Take 440 mg by mouth daily as needed.    [provider]  nicotine  (NICODERM CQ - DOSED IN MG/24 HOURS) 21 mg/24hr patch Place 1 patch (21 mg total) onto the skin daily. 04/24/20   Barton Dubois, MD  pantoprazole (PROTONIX) 40 MG tablet Take 1 tablet (40 mg total) by mouth 2 (two) times daily. 04/23/20   Barton Dubois, MD  permethrin (ELIMITE) 5 % cream Apply from neck down, leaving on for 10 hours then shower.  Repeat in 10 days if symptoms persist Patient not taking: Reported on 03/30/2020 02/17/20   Evalee Jefferson, PA-C  promethazine (PHENERGAN) 25 MG tablet Take 1 tablet (25 mg total) by mouth every 8 (eight) hours as needed for nausea  or vomiting. 04/23/20   Barton Dubois, MD  thiamine 100 MG tablet Take 1 tablet (100 mg total) by mouth daily. Patient not taking: Reported on 01/07/2020 10/29/19   Roxan Hockey, MD    Allergies    Toradol [ketorolac tromethamine] and Diona Fanti [aspirin]  Review of Systems   Review of Systems  All other systems reviewed and are negative.   Physical Exam Updated Vital Signs BP (!) 149/98 (BP Location: Right Arm)    Pulse 72    Temp 98.1 F (36.7 C) (Oral)    Resp 14    Ht 5\' 6"  (1.676 m)    Wt 54.4 kg    SpO2 100%    BMI 19.37 kg/m   Physical Exam Vitals and nursing note reviewed. Exam conducted with a chaperone present.  Constitutional:      General: She is not in acute distress.    Appearance: Normal appearance.  HENT:     Head: Normocephalic and atraumatic.  Eyes:     General: No scleral icterus.    Conjunctiva/sclera: Conjunctivae normal.  Cardiovascular:     Rate and Rhythm: Normal rate and regular rhythm.     Pulses: Normal pulses.     Heart sounds: Normal heart sounds.  Pulmonary:     Effort: Pulmonary effort is normal. No respiratory distress.     Breath sounds: Normal breath sounds. No wheezing or rales.  Abdominal:     Comments: Soft, nondistended.  Mild bruising from subcu heparin injections while admitted.  No tenderness.  No CVAT.  No guarding.  No peritoneal signs.  Musculoskeletal:        General:  Normal range of motion.     Cervical back: Normal range of motion and neck supple. No rigidity.  Skin:    General: Skin is dry.     Capillary Refill: Capillary refill takes less than 2 seconds.  Neurological:     Mental Status: She is alert and oriented to person, place, and time.     GCS: GCS eye subscore is 4. GCS verbal subscore is 5. GCS motor subscore is 6.  Psychiatric:        Mood and Affect: Mood normal.        Behavior: Behavior normal.        Thought Content: Thought content normal.     ED Results / Procedures / Treatments   Labs (all labs ordered are listed, but only abnormal results are displayed) Labs Reviewed  CBC - Abnormal; Notable for the following components:      Result Value   RBC 3.70 (*)    MCV 101.4 (*)    RDW 18.5 (*)    Platelets 511 (*)    All other components within normal limits  URINALYSIS, ROUTINE W REFLEX MICROSCOPIC - Abnormal; Notable for the following components:   Leukocytes,Ua TRACE (*)    Bacteria, UA RARE (*)    All other components within normal limits  COMPREHENSIVE METABOLIC PANEL - Abnormal; Notable for the following components:   Glucose, Bld 117 (*)    BUN <5 (*)    Total Protein 6.1 (*)    Albumin 2.8 (*)    All other components within normal limits  CBG MONITORING, ED - Abnormal; Notable for the following components:   Glucose-Capillary 114 (*)    All other components within normal limits  TROPONIN I (HIGH SENSITIVITY) - Abnormal; Notable for the following components:   Troponin I (High Sensitivity) 18 (*)    All other  components within normal limits  RESPIRATORY PANEL BY RT PCR (FLU A&B, COVID)  LACTIC ACID, PLASMA  I-STAT BETA HCG BLOOD, ED ([facility], [facility], [facility] ONLY)  TROPONIN I (HIGH SENSITIVITY)    EKG EKG Interpretation  Date/Time:  Thursday April 30 2020 13:20:27 EDT Ventricular Rate:  72 PR Interval:    QRS Duration: 91 QT Interval:  448 QTC Calculation: 491 R Axis:   57 Text Interpretation: Sinus rhythm  Borderline prolonged QT interval Confirmed by Madalyn Rob [id number]) on 04/30/2020 2:04:31 PM   Radiology DG Chest Portable 1 View  Result Date: 04/30/2020 CLINICAL DATA:  Short of breath, fever EXAM: PORTABLE CHEST 1 VIEW COMPARISON:  04/17/2020 FINDINGS: Normal cardiac silhouette. Small LEFT effusion. Mild LEFT basilar atelectasis. No focal infiltrate. No pulmonary edema. No pneumothorax IMPRESSION: Small LEFT effusion and LEFT basilar atelectasis. No clear evidence of pneumonia. Electronically Signed   By: Suzy Bouchard M.D.   On: 04/30/2020 12:03    Procedures Procedures (including critical care time)  Medications Ordered in ED Medications  sodium chloride 0.9 % bolus 1,000 mL (0 mLs Intravenous Stopped 04/30/20 1317)  ondansetron (ZOFRAN) injection 4 mg (4 mg Intravenous Given 04/30/20 1204)  acetaminophen (TYLENOL) tablet 650 mg (650 mg Oral Given 04/30/20 1207)    ED Course  I have reviewed the triage vital signs and the nursing notes.  Pertinent labs & imaging results that were available during my care of the patient were reviewed by me and considered in my medical decision making (see chart for details).    MDM Rules/Calculators/A&P                          I reviewed patient's medical record and she had extensive imaging obtained while admitted in the hospital, including CT abdomen and pelvis with contrast, MRI abdomen MRCP, and CT angio chest PE study which collectively revealed severe hepatic steatosis, moderate bilateral pleural effusions and left lower lobe pneumonia, as well as bilateral pyelonephritis, worse on the left side.  Patient was negative for Legionella.  Repeat blood cultures while admitted demonstrated no growth.  D-dimer was elevated at 6.20, but no obvious pulmonary embolism on the CTA chest.  HIV was nonreactive.  Respiratory panel was pan negative.  Labs I-STAT beta-hCG: Less than 5. CMP: Improved when compared to recent.  No significant electrolyte  derangement. CBC: No leukocytosis.  WBC of 8.6, continually downtrending from labs obtained 12 days ago. CBG: Unremarkable. UA: Negative nitrate.  Trace leukocytes and rare bacteria.  Improved when compared to labs obtained 2 weeks ago. Troponin: 18 >> 16 Lactic acid: 1.0   Imaging DG chest is personally reviewed and demonstrates small left pleural effusion and atelectasis, no clear evidence for pneumonia.  Improved when compared to prior imaging.  EKG I personally viewed EKG which demonstrates sinus rhythm with borderline prolonged QT.  On subsequent evaluation, patient is feeling much improved.  She states that she just finished her final dose of Augmentin yesterday which is why she was concerned and wanted come to the ED for evaluation.  She thought that perhaps she needed more antibiotics.  She states that she had recurrent pneumonia in the past and was afraid that her pneumonia or [place] was returning.  She is reassured by today's comprehensive work-up.  She feels stable for discharge at this time and will go home with her mother.  All of the evaluation and work-up results were discussed with the patient and any family at  bedside.  Patient and/or family were informed that while patient is appropriate for discharge at this time, some medical emergencies may only develop or become detectable after a period of time.  I specifically instructed patient and/or family to return to return to the ED or seek immediate medical attention for any new or worsening symptoms.  They were provided opportunity to ask any additional questions and have none at this time.  Prior to discharge patient is feeling well, agreeable with plan for discharge home.  They have expressed understanding of verbal discharge instructions as well as return precautions and are agreeable to the plan.   Final Clinical Impression(s) / ED Diagnoses Final diagnoses:  Fever, unspecified fever cause    Rx / DC Orders ED Discharge Orders     None       Corena Herter, PA-C 04/30/20 1441    Lucrezia Starch, MD 05/02/20 [phone]

## 2020-04-30 NOTE — ED Notes (Signed)
Pt eating ice. Unable to get temp. Will try later.

## 2020-04-30 NOTE — ED Notes (Signed)
Pt's CBG result was 114. Informed Jazmine - RN.

## 2020-04-30 NOTE — ED Triage Notes (Signed)
Pt arrives to ED with complaints of shortness of breath and lower back pain. Pt states she was discharged a week ago from [facility] with dx of pneumonia an pyelonephritis. Just finished abx at home. States SOB has not gotten better and lower back still hurts.

## 2020-04-30 NOTE — Discharge Instructions (Addendum)
Please other call the [facility] to get established with a primary care provider or go to the frequently could [place], and see.  You may also consider the Department of Health.  Your comprehensive work-up obtained here today was reassuring.  Please continue to check your temperature regularly and take Tylenol or ibuprofen as needed for fever control.  Please drink plenty of fluids and eat regular meals.  Continue with Phenergan as needed for nausea symptoms.  I recommend over-the-counter antitussive medications for relief of your cough symptoms.  Return to the ED or seek immediate medical attention should you experience any new or worsening symptoms.

## 2020-06-03 ENCOUNTER — Emergency Department
Admission: EM | Admit: 2020-06-03 | Discharge: 2020-06-03 | Disposition: A | Discharge status: Home / Self Care | Payer: Medicaid Other | Attending: Emergency Medicine | Admitting: Emergency Medicine

## 2020-06-03 ENCOUNTER — Emergency Department: Payer: Medicaid Other

## 2020-06-03 ENCOUNTER — Other Ambulatory Visit: Payer: Self-pay

## 2020-06-03 DIAGNOSIS — E8729 Other acidosis: Secondary | ICD-10-CM

## 2020-06-03 DIAGNOSIS — F17210 Nicotine dependence, cigarettes, uncomplicated: Secondary | ICD-10-CM | POA: Insufficient documentation

## 2020-06-03 DIAGNOSIS — I10 Essential (primary) hypertension: Secondary | ICD-10-CM | POA: Insufficient documentation

## 2020-06-03 DIAGNOSIS — M549 Dorsalgia, unspecified: Secondary | ICD-10-CM | POA: Insufficient documentation

## 2020-06-03 DIAGNOSIS — E872 Acidosis: Secondary | ICD-10-CM | POA: Insufficient documentation

## 2020-06-03 DIAGNOSIS — R197 Diarrhea, unspecified: Secondary | ICD-10-CM | POA: Insufficient documentation

## 2020-06-03 DIAGNOSIS — R112 Nausea with vomiting, unspecified: Secondary | ICD-10-CM

## 2020-06-03 DIAGNOSIS — R509 Fever, unspecified: Secondary | ICD-10-CM | POA: Insufficient documentation

## 2020-06-03 DIAGNOSIS — Z79899 Other long term (current) drug therapy: Secondary | ICD-10-CM | POA: Insufficient documentation

## 2020-06-03 DIAGNOSIS — M25551 Pain in right hip: Secondary | ICD-10-CM | POA: Insufficient documentation

## 2020-06-03 DIAGNOSIS — Z20822 Contact with and (suspected) exposure to covid-19: Secondary | ICD-10-CM | POA: Insufficient documentation

## 2020-06-03 DIAGNOSIS — R109 Unspecified abdominal pain: Secondary | ICD-10-CM | POA: Insufficient documentation

## 2020-06-03 LAB — COMPREHENSIVE METABOLIC PANEL
ALT: 60 U/L — ABNORMAL HIGH (ref 0–44)
AST: 146 U/L — ABNORMAL HIGH (ref 15–41)
Albumin: 4 g/dL (ref 3.5–5.0)
Alkaline Phosphatase: 107 U/L (ref 38–126)
Anion gap: 23 — ABNORMAL HIGH (ref 5–15)
BUN: <5 mg/dL — ABNORMAL LOW (ref 6–20)
CO2: 24 mmol/L (ref 22–32)
Calcium: 8.7 mg/dL — ABNORMAL LOW (ref 8.9–10.3)
Chloride: 95 mmol/L — ABNORMAL LOW (ref 98–111)
Creatinine, Ser: 0.43 mg/dL — ABNORMAL LOW (ref 0.44–1.00)
GFR, Estimated: >60 mL/min (ref >60)
Glucose, Bld: 90 mg/dL (ref 70–99)
Potassium: 2.9 mmol/L — ABNORMAL LOW (ref 3.5–5.1)
Sodium: 142 mmol/L (ref 135–145)
Total Bilirubin: 0.9 mg/dL (ref 0.3–1.2)
Total Protein: 7.7 g/dL (ref 6.5–8.1)

## 2020-06-03 LAB — URINALYSIS, COMPLETE (UACMP) WITH MICROSCOPIC
Bilirubin Urine: NEGATIVE
Glucose, UA: NEGATIVE mg/dL
Hgb urine dipstick: NEGATIVE
Ketones, ur: NEGATIVE mg/dL
Leukocytes,Ua: NEGATIVE
Nitrite: NEGATIVE
Protein, ur: NEGATIVE mg/dL
Specific Gravity, Urine: 1.002 — ABNORMAL LOW (ref 1.005–1.030)
pH: 7 (ref 5.0–8.0)

## 2020-06-03 LAB — CBC
HCT: 48.2 % — ABNORMAL HIGH (ref 36.0–46.0)
Hemoglobin: 17 g/dL — ABNORMAL HIGH (ref 12.0–15.0)
MCH: 34.8 pg — ABNORMAL HIGH (ref 26.0–34.0)
MCHC: 35.3 g/dL (ref 30.0–36.0)
MCV: 98.6 fL (ref 80.0–100.0)
Platelets: 142 10*3/uL — ABNORMAL LOW (ref 150–400)
RBC: 4.89 MIL/uL (ref 3.87–5.11)
RDW: 15 % (ref 11.5–15.5)
WBC: 5.4 10*3/uL (ref 4.0–10.5)
nRBC: 0 % (ref 0.0–0.2)

## 2020-06-03 LAB — URINE DRUG SCREEN, QUALITATIVE (ARMC ONLY)
Amphetamines, Ur Screen: NOT DETECTED
Barbiturates, Ur Screen: NOT DETECTED
Benzodiazepine, Ur Scrn: NOT DETECTED
Cannabinoid 50 Ng, Ur ~~LOC~~: POSITIVE — AB
Cocaine Metabolite,Ur ~~LOC~~: NOT DETECTED
MDMA (Ecstasy)Ur Screen: NOT DETECTED
Methadone Scn, Ur: NOT DETECTED
Opiate, Ur Screen: NOT DETECTED
Phencyclidine (PCP) Ur S: NOT DETECTED
Tricyclic, Ur Screen: NOT DETECTED

## 2020-06-03 LAB — LIPASE, BLOOD: Lipase: 26 U/L (ref 11–51)

## 2020-06-03 LAB — RESPIRATORY PANEL BY RT PCR (FLU A&B, COVID)
Influenza A by PCR: NEGATIVE
Influenza B by PCR: NEGATIVE
SARS Coronavirus 2 by RT PCR: NEGATIVE

## 2020-06-03 LAB — ETHANOL: Alcohol, Ethyl (B): 338 mg/dL (ref <10)

## 2020-06-03 MED ORDER — ONDANSETRON HCL 4 MG/2ML IJ SOLN
4.0000 mg | Freq: Once | INTRAMUSCULAR | Status: AC
  Administered 2020-06-03: 4 mg via INTRAVENOUS
  Filled 2020-06-03: qty 2

## 2020-06-03 MED ORDER — DIPHENHYDRAMINE HCL 50 MG/ML IJ SOLN
25.0000 mg | Freq: Once | INTRAMUSCULAR | Status: AC
  Administered 2020-06-03: 25 mg via INTRAVENOUS
  Filled 2020-06-03: qty 1

## 2020-06-03 MED ORDER — SODIUM CHLORIDE 0.9 % IV BOLUS
1000.0000 mL | Freq: Once | INTRAVENOUS | Status: AC
  Administered 2020-06-03: 1000 mL via INTRAVENOUS

## 2020-06-03 MED ORDER — PANTOPRAZOLE SODIUM 40 MG PO TBEC: 40.0000 mg | DELAYED_RELEASE_TABLET | Freq: Every day | ORAL | 2 refills | Status: DC

## 2020-06-03 MED ORDER — IOHEXOL 300 MG/ML  SOLN
75.0000 mL | Freq: Once | INTRAMUSCULAR | Status: AC | PRN
  Administered 2020-06-03: 75 mL via INTRAVENOUS

## 2020-06-03 MED ORDER — POTASSIUM CHLORIDE CRYS ER 10 MEQ PO TBCR
10.0000 meq | EXTENDED_RELEASE_TABLET | Freq: Two times a day (BID) | ORAL | 0 refills | Status: DC
Start: 2020-06-03 — End: 2020-07-07

## 2020-06-03 MED ORDER — FENTANYL CITRATE (PF) 100 MCG/2ML IJ SOLN
50.0000 ug | Freq: Once | INTRAMUSCULAR | Status: AC
  Administered 2020-06-03: 50 ug via INTRAVENOUS
  Filled 2020-06-03: qty 2

## 2020-06-03 MED ORDER — PROMETHAZINE HCL 25 MG PO TABS: 25.0000 mg | ORAL_TABLET | Freq: Four times a day (QID) | ORAL | 0 refills | Status: DC | PRN

## 2020-06-03 MED ORDER — IOHEXOL 9 MG/ML PO SOLN
1000.0000 mL | ORAL | Status: AC | PRN
  Administered 2020-06-03: 1000 mL via ORAL

## 2020-06-03 NOTE — ED Triage Notes (Signed)
Pt c/o diarrhea and vomiting up coffee ground material X 3 days. Recent fall, with bruising to right back. Admits to Etoh abuse, last drink last night at 2300.

## 2020-06-03 NOTE — ED Notes (Signed)
Pt very hard of hearing

## 2020-06-03 NOTE — Discharge Instructions (Addendum)
As we discussed please drink plenty of fluids.  Take your medications as prescribed.  Please avoid alcohol.  Return to the emergency department if you are unable to keep down fluids due to vomiting, or any other symptom personally concerning to yourself.

## 2020-06-03 NOTE — ED Provider Notes (Signed)
[facility] Emergency Department Provider Note  Time seen: 10:31 AM  I have reviewed the triage vital signs and the nursing notes.   HISTORY  Chief Complaint Nausea   HPI Olivia Lester is a 44 y.o. female with a past medical history of alcohol abuse pancreatitis, polysubstance abuse, presents to the emergency department  with nausea vomiting diarrhea, lower abdominal discomfort recent falls and bruising.  Patient states over the past 3 days she has been experiencing nausea vomiting diarrhea has not been able to eat or drink due to this.  Also states moderate mid abdominal dull discomfort.  No dysuria.  Patient states dark stool.  States some of her vomit has been brown in appearance.  Patient states she is also experiencing pain in her back and her right hip due to recent fall she has bruising to this area.  Past Medical History:  Diagnosis Date  . Alcohol abuse   . Back injury   . Gastric ulcer   . Gastric ulcer 10/14/2013  . Hepatitis C 10/14/2013  . History of kidney stones   . 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 Pancreatitis, acute   . Panic attack   . Pneumonia   . Polysubstance abuse ([place])   . PONV (postoperative nausea and vomiting)   . Seizures [facility])     Patient Active Problem List   Diagnosis Date Noted  . Sepsis due to Escherichia coli (E. coli) ([place]) 04/20/2020  . Acute pyelonephritis 04/18/2020  . Sepsis ([place]) 04/17/2020  . Fatty liver, alcoholic/Severe hepatic steatosis 01/29/2020  . Pancreatitis, recurrent 01/26/2020  . Colitis 10/28/2019  . Polysubstance abuse ([place]) 10/28/2019  . Hypomagnesemia 10/28/2019  . Alcoholic hepatitis without ascites--Severe hepatic steatosis 10/28/2019  . Alcoholic gastritis 17/61/6073  . DTs (delirium tremens) ([place])   . Urinary tract infection without hematuria   . Alcohol intoxication ([place]) 09/04/2019  . Alcohol withdrawal ([place]) 06/24/2019  . Sinus tachycardia 06/24/2019  . Essential  hypertension 06/24/2019  . Hypokalemia 03/31/2019  . Cannabis abuse 03/11/2019  . Elevated transaminase level   . Alcohol abuse   . Closed displaced fracture of fifth metatarsal bone of right foot 08/02/2018  . Dental abscess 12/25/2017  . Gastric ulcer 10/14/2013  . Transaminitis 10/14/2013  . Tobacco dependence 10/14/2013  . Hepatitis C 10/14/2013  . Thrombocytopenia ([place]) 10/13/2013  . Duodenitis 10/12/2013  . Alcoholism ([place]) 10/12/2013  . Abdominal pain, epigastric 10/12/2013  . ANKLE PAIN 01/01/2008    Past Surgical History:  Procedure Laterality Date  . BACK SURGERY    . BREAST SURGERY    . ESOPHAGOGASTRODUODENOSCOPY (EGD) WITH PROPOFOL N/A 10/14/2013   Procedure: ESOPHAGOGASTRODUODENOSCOPY (EGD) WITH PROPOFOL;  Surgeon: Rogene Houston, MD;  Location: [facility];  Service: Endoscopy;  Laterality: N/A;  . ORIF TOE FRACTURE Right 08/08/2018   Procedure: RIGHT FOOT INTERNAL FIXATION 5TH METATARSAL;  Surgeon: Newt Minion, MD;  Location: [place];  Service: Orthopedics;  Laterality: Right;  . TONSILLECTOMY      Prior to Admission medications   Medication Sig Start Date End Date Taking? Authorizing Provider  acetaminophen (TYLENOL) 325 MG tablet Take 2 tablets (650 mg total) by mouth every 6 (six) hours as needed for mild pain or headache (or Fever >/= 101). 04/23/20   Barton Dubois, MD  amLODipine (NORVASC) 5 MG tablet Take 1 tablet (5 mg total) by mouth daily. Patient not taking: Reported on 01/26/2020 10/28/19   Roxan Hockey, MD  folic acid (FOLVITE)  1 MG tablet Take 1 tablet (1 mg total) by mouth daily. Patient not taking: Reported on 01/07/2020 10/29/19   Roxan Hockey, MD  lipase/protease/amylase 24000-76000 units CPEP Take 1 capsule (24,000 Units total) by mouth 3 (three) times daily before meals. Patient not taking: Reported on 01/07/2020 10/29/19   Roxan Hockey, MD  Multiple Vitamin (MULTIVITAMIN WITH MINERALS) TABS tablet Take 1 tablet by mouth every other day.  Patient  not taking: Reported on 01/07/2020    [provider]  naproxen sodium (ALEVE) 220 MG tablet Take 440 mg by mouth daily as needed.    [provider]  nicotine (NICODERM CQ - DOSED IN MG/24 HOURS) 21 mg/24hr patch Place 1 patch (21 mg total) onto the skin daily. 04/24/20   Barton Dubois, MD  pantoprazole (PROTONIX) 40 MG tablet Take 1 tablet (40 mg total) by mouth 2 (two) times daily. 04/23/20   Barton Dubois, MD  permethrin (ELIMITE) 5 % cream Apply from neck down, leaving on for 10 hours then shower.  Repeat in 10 days if symptoms persist Patient not taking: Reported on 03/30/2020 02/17/20   Evalee Jefferson, PA-C  promethazine (PHENERGAN) 25 MG tablet Take 1 tablet (25 mg total) by mouth every 8 (eight) hours as needed for nausea or vomiting. 04/23/20   Barton Dubois, MD  thiamine 100 MG tablet Take 1 tablet (100 mg total) by mouth daily. Patient not taking: Reported on 01/07/2020 10/29/19   Roxan Hockey, MD    Allergies  Allergen Reactions  . Toradol [Ketorolac Tromethamine] Other (See Comments)    Patient advised not to take due to ulcers  . Asa [Aspirin] Rash    Family History  Problem Relation Age of Onset  . Cancer Mother 54       ovarian   . Hypertension Mother   . Stroke Mother   . Early death Father   . Alcohol abuse Father     Social History Social History   Tobacco Use  . Smoking status: Current Every Day Smoker    Packs/day: 1.00    Years: 20.00    Pack years: 20.00    Types: Cigarettes  . Smokeless tobacco: Never Used  Vaping Use  . Vaping Use: Never used  Substance Use Topics  . Alcohol use: Yes    Alcohol/week: 18.0 standard drinks    Types: 18 Cans of beer per week    Comment: 1/5 -1/2 gallon of liqour daily or 18 beers daily  . Drug use: Yes    Types: Marijuana, Cocaine, Methamphetamines    Comment: denies current cocaine/meth...current mj    Review of Systems Constitutional: Subjective low-grade fever at home Cardiovascular: Negative  for chest pain. Respiratory: Negative for shortness of breath. Gastrointestinal: Mid abdominal pain.  Positive nausea vomiting diarrhea x3 days. Genitourinary: Negative for urinary compaints Musculoskeletal: Negative for musculoskeletal complaints Neurological: Negative for headache All other ROS negative  ____________________________________________   PHYSICAL EXAM:  VITAL SIGNS: ED Triage Vitals  Enc Vitals Group     BP 06/03/20 0941 (!) 158/130     Pulse Rate 06/03/20 0941 81     Resp 06/03/20 0941 18     Temp 06/03/20 0941 98.4 F (36.9 C)     Temp Source 06/03/20 0941 Oral     SpO2 06/03/20 0941 100 %     Weight 06/03/20 0943 115 lb 4.8 oz (52.3 kg)     Height 06/03/20 0943 5\' 7"  (1.702 m)     Head Circumference --  Peak Flow --      Pain Score 06/03/20 0943 9     Pain Loc --      Pain Edu? --      Excl. in [place]? --    Constitutional: Alert and oriented. Well appearing and in no distress. Eyes: Normal exam ENT      Head: Normocephalic and atraumatic.      Mouth/Throat: Mucous membranes are moist. Cardiovascular: Normal rate, regular rhythm. No murmur Respiratory: Normal respiratory effort without tachypnea nor retractions. Breath sounds are clear Gastrointestinal: Soft, mild diffuse tenderness without focal tenderness identified.  No rebound guarding or distention. Musculoskeletal: Patient does have an area of ecchymosis to her back approximately 3 x 8 cm.  Small area of ecchymosis to her right hip approximately 5 cm in diameter.  Great range of motion in all extremities. Neurologic:  Normal speech and language.  Skin:  Skin is warm.  Bruising as described above. Psychiatric: Mood and affect are normal.   ____________________________________________   RADIOLOGY  Mild wall thickening of the gastric antrum may represent gastritis.  No other acute ab abdominal findings.  ____________________________________________   INITIAL IMPRESSION / ASSESSMENT AND PLAN  / ED COURSE  Pertinent labs & imaging results that were available during my care of the patient were reviewed by me and considered in my medical decision making (see chart for details).   Patient presents to the emergency department with complaints of low-grade fever 3 days of nausea vomiting diarrhea, mid abdominal discomfort which is somewhat chronic per patient.  Does admit to alcohol use has not been eating or drinking.  Last alcohol was last night.  Patient is asking for pain medication given her recent falls.  No headache.  We will check labs, obtain a Covid test, treat pain nausea and IV hydrate while awaiting results.  Rectal examination performed by myself shows light brown stool that is guaiac negative.  We will obtain CT imaging of the abdomen to further evaluate.  Patient agreeable to plan.  CT scan shows possible gastritis otherwise no significant findings.  Patient's alcohol level is significantly elevated 338.  Covid test is negative.  Urinalysis is negative.  Urine tox is positive for cannabinoids only.  Patient's chemistry does show mild hypokalemia we will place on supplements.  Liver function tests are elevated more so than previously.  Patient does appear dehydrated has an anion gap of 23 likely representing alcoholic ketoacidosis.  Patient states she is feeling much better.  Wishes to go home with nausea medication.  Discussed the importance of significant oral hydration as well as potassium supplementation.  I discussed return precautions.  Patient agreeable to plan of care.  Olivia Lester was evaluated in Emergency Department on 06/03/2020 for the symptoms described in the history of present illness. She was evaluated in the context of the global COVID-19 pandemic, which necessitated consideration that the patient might be at risk for infection with the SARS-CoV-2 virus that causes COVID-19. Institutional protocols and algorithms that pertain to the evaluation of patients at risk for  COVID-19 are in a state of rapid change based on information released by regulatory bodies including the CDC and federal and state organizations. These policies and algorithms were followed during the patient's care in the ED.  ____________________________________________   FINAL CLINICAL IMPRESSION(S) / ED DIAGNOSES  Nausea vomiting diarrhea Alcoholic ketoacidosis.   Harvest Dark, MD 06/03/20 1416

## 2020-06-10 ENCOUNTER — Emergency Department: Payer: Self-pay

## 2020-06-10 ENCOUNTER — Other Ambulatory Visit: Payer: Self-pay

## 2020-06-10 ENCOUNTER — Encounter: Payer: Self-pay | Admitting: Emergency Medicine

## 2020-06-10 ENCOUNTER — Observation Stay: Payer: Self-pay

## 2020-06-10 ENCOUNTER — Inpatient Hospital Stay
Admission: EM | Admit: 2020-06-10 | Discharge: 2020-06-12 | DRG: 897 | Disposition: A | Discharge status: Home / Self Care | Payer: Self-pay | Attending: Internal Medicine | Admitting: Internal Medicine

## 2020-06-10 DIAGNOSIS — F10239 Alcohol dependence with withdrawal, unspecified: Principal | ICD-10-CM | POA: Diagnosis present

## 2020-06-10 DIAGNOSIS — E876 Hypokalemia: Secondary | ICD-10-CM | POA: Diagnosis present

## 2020-06-10 DIAGNOSIS — S0181XA Laceration without foreign body of other part of head, initial encounter: Secondary | ICD-10-CM

## 2020-06-10 DIAGNOSIS — R7989 Other specified abnormal findings of blood chemistry: Secondary | ICD-10-CM | POA: Diagnosis present

## 2020-06-10 DIAGNOSIS — F10932 Alcohol use, unspecified with withdrawal with perceptual disturbance: Secondary | ICD-10-CM

## 2020-06-10 DIAGNOSIS — F17200 Nicotine dependence, unspecified, uncomplicated: Secondary | ICD-10-CM | POA: Diagnosis present

## 2020-06-10 DIAGNOSIS — Z20822 Contact with and (suspected) exposure to covid-19: Secondary | ICD-10-CM | POA: Diagnosis present

## 2020-06-10 DIAGNOSIS — F1910 Other psychoactive substance abuse, uncomplicated: Secondary | ICD-10-CM | POA: Diagnosis present

## 2020-06-10 DIAGNOSIS — Z8249 Family history of ischemic heart disease and other diseases of the circulatory system: Secondary | ICD-10-CM

## 2020-06-10 DIAGNOSIS — Z811 Family history of alcohol abuse and dependence: Secondary | ICD-10-CM

## 2020-06-10 DIAGNOSIS — R569 Unspecified convulsions: Secondary | ICD-10-CM

## 2020-06-10 DIAGNOSIS — R945 Abnormal results of liver function studies: Secondary | ICD-10-CM

## 2020-06-10 DIAGNOSIS — F410 Panic disorder [episodic paroxysmal anxiety] without agoraphobia: Secondary | ICD-10-CM | POA: Diagnosis present

## 2020-06-10 DIAGNOSIS — W19XXXA Unspecified fall, initial encounter: Secondary | ICD-10-CM

## 2020-06-10 DIAGNOSIS — Z87442 Personal history of urinary calculi: Secondary | ICD-10-CM

## 2020-06-10 DIAGNOSIS — E871 Hypo-osmolality and hyponatremia: Secondary | ICD-10-CM | POA: Diagnosis present

## 2020-06-10 DIAGNOSIS — I10 Essential (primary) hypertension: Secondary | ICD-10-CM | POA: Diagnosis present

## 2020-06-10 DIAGNOSIS — B1920 Unspecified viral hepatitis C without hepatic coma: Secondary | ICD-10-CM | POA: Diagnosis present

## 2020-06-10 DIAGNOSIS — N179 Acute kidney failure, unspecified: Secondary | ICD-10-CM | POA: Diagnosis present

## 2020-06-10 DIAGNOSIS — F10232 Alcohol dependence with withdrawal with perceptual disturbance: Secondary | ICD-10-CM

## 2020-06-10 DIAGNOSIS — R296 Repeated falls: Secondary | ICD-10-CM

## 2020-06-10 DIAGNOSIS — K219 Gastro-esophageal reflux disease without esophagitis: Secondary | ICD-10-CM | POA: Diagnosis present

## 2020-06-10 DIAGNOSIS — D696 Thrombocytopenia, unspecified: Secondary | ICD-10-CM | POA: Diagnosis present

## 2020-06-10 DIAGNOSIS — F17210 Nicotine dependence, cigarettes, uncomplicated: Secondary | ICD-10-CM | POA: Diagnosis present

## 2020-06-10 DIAGNOSIS — F10230 Alcohol dependence with withdrawal, uncomplicated: Secondary | ICD-10-CM

## 2020-06-10 DIAGNOSIS — F10939 Alcohol use, unspecified with withdrawal, unspecified: Secondary | ICD-10-CM | POA: Diagnosis present

## 2020-06-10 LAB — BASIC METABOLIC PANEL
Anion gap: 17 — ABNORMAL HIGH (ref 5–15)
Anion gap: 15 (ref 5–15)
BUN: 18 mg/dL (ref 6–20)
BUN: 22 mg/dL — ABNORMAL HIGH (ref 6–20)
CO2: 25 mmol/L (ref 22–32)
CO2: 27 mmol/L (ref 22–32)
Calcium: 7.8 mg/dL — ABNORMAL LOW (ref 8.9–10.3)
Calcium: 8.7 mg/dL — ABNORMAL LOW (ref 8.9–10.3)
Chloride: 86 mmol/L — ABNORMAL LOW (ref 98–111)
Chloride: 86 mmol/L — ABNORMAL LOW (ref 98–111)
Creatinine, Ser: 0.83 mg/dL (ref 0.44–1.00)
Creatinine, Ser: 0.97 mg/dL (ref 0.44–1.00)
GFR, Estimated: >60 mL/min (ref >60)
GFR, Estimated: >60 mL/min (ref >60)
Glucose, Bld: 75 mg/dL (ref 70–99)
Glucose, Bld: 81 mg/dL (ref 70–99)
Potassium: 2.8 mmol/L — ABNORMAL LOW (ref 3.5–5.1)
Potassium: 3.4 mmol/L — ABNORMAL LOW (ref 3.5–5.1)
Sodium: 128 mmol/L — ABNORMAL LOW (ref 135–145)
Sodium: 128 mmol/L — ABNORMAL LOW (ref 135–145)

## 2020-06-10 LAB — COMPREHENSIVE METABOLIC PANEL
ALT: 105 U/L — ABNORMAL HIGH (ref 0–44)
AST: 325 U/L — ABNORMAL HIGH (ref 15–41)
Albumin: 4.9 g/dL (ref 3.5–5.0)
Alkaline Phosphatase: 95 U/L (ref 38–126)
Anion gap: 23 — ABNORMAL HIGH (ref 5–15)
BUN: 20 mg/dL (ref 6–20)
CO2: 25 mmol/L (ref 22–32)
Calcium: 9.7 mg/dL (ref 8.9–10.3)
Chloride: 77 mmol/L — ABNORMAL LOW (ref 98–111)
Creatinine, Ser: 1.57 mg/dL — ABNORMAL HIGH (ref 0.44–1.00)
GFR, Estimated: 41 mL/min — ABNORMAL LOW (ref >60)
Glucose, Bld: 83 mg/dL (ref 70–99)
Potassium: 3.1 mmol/L — ABNORMAL LOW (ref 3.5–5.1)
Sodium: 125 mmol/L — ABNORMAL LOW (ref 135–145)
Total Bilirubin: 1.9 mg/dL — ABNORMAL HIGH (ref 0.3–1.2)
Total Protein: 8.6 g/dL — ABNORMAL HIGH (ref 6.5–8.1)

## 2020-06-10 LAB — CBC WITH DIFFERENTIAL/PLATELET
Abs Immature Granulocytes: 0.1 10*3/uL — ABNORMAL HIGH (ref 0.00–0.07)
Basophils Absolute: 0 10*3/uL (ref 0.0–0.1)
Basophils Relative: 0 %
Eosinophils Absolute: 0 10*3/uL (ref 0.0–0.5)
Eosinophils Relative: 0 %
HCT: 46.2 % — ABNORMAL HIGH (ref 36.0–46.0)
Hemoglobin: 16.9 g/dL — ABNORMAL HIGH (ref 12.0–15.0)
Immature Granulocytes: 1 %
Lymphocytes Relative: 11 %
Lymphs Abs: 1.3 10*3/uL (ref 0.7–4.0)
MCH: 34.5 pg — ABNORMAL HIGH (ref 26.0–34.0)
MCHC: 36.6 g/dL — ABNORMAL HIGH (ref 30.0–36.0)
MCV: 94.3 fL (ref 80.0–100.0)
Monocytes Absolute: 0.7 10*3/uL (ref 0.1–1.0)
Monocytes Relative: 6 %
Neutro Abs: 9.3 10*3/uL — ABNORMAL HIGH (ref 1.7–7.7)
Neutrophils Relative %: 82 %
Platelets: 82 10*3/uL — ABNORMAL LOW (ref 150–400)
RBC: 4.9 MIL/uL (ref 3.87–5.11)
RDW: 14.1 % (ref 11.5–15.5)
Smear Review: NORMAL
WBC: 11.4 10*3/uL — ABNORMAL HIGH (ref 4.0–10.5)
nRBC: 0 % (ref 0.0–0.2)

## 2020-06-10 LAB — URINALYSIS, COMPLETE (UACMP) WITH MICROSCOPIC
Bilirubin Urine: NEGATIVE
Glucose, UA: NEGATIVE mg/dL
Ketones, ur: 20 mg/dL — AB
Leukocytes,Ua: NEGATIVE
Nitrite: NEGATIVE
Protein, ur: 30 mg/dL — AB
Specific Gravity, Urine: 1.013 (ref 1.005–1.030)
pH: 6 (ref 5.0–8.0)

## 2020-06-10 LAB — RESPIRATORY PANEL BY RT PCR (FLU A&B, COVID)
Influenza A by PCR: NEGATIVE
Influenza B by PCR: NEGATIVE
SARS Coronavirus 2 by RT PCR: NEGATIVE

## 2020-06-10 LAB — SODIUM, URINE, RANDOM: Sodium, Ur: 24 mmol/L

## 2020-06-10 LAB — LACTATE DEHYDROGENASE: LDH: 394 U/L — ABNORMAL HIGH (ref 98–192)

## 2020-06-10 LAB — MAGNESIUM: Magnesium: 1.3 mg/dL — ABNORMAL LOW (ref 1.7–2.4)

## 2020-06-10 LAB — URINE DRUG SCREEN, QUALITATIVE (ARMC ONLY)
Amphetamines, Ur Screen: POSITIVE — AB
Barbiturates, Ur Screen: POSITIVE — AB
Benzodiazepine, Ur Scrn: NOT DETECTED
Cannabinoid 50 Ng, Ur ~~LOC~~: POSITIVE — AB
Cocaine Metabolite,Ur ~~LOC~~: NOT DETECTED
MDMA (Ecstasy)Ur Screen: NOT DETECTED
Methadone Scn, Ur: NOT DETECTED
Opiate, Ur Screen: NOT DETECTED
Phencyclidine (PCP) Ur S: NOT DETECTED
Tricyclic, Ur Screen: NOT DETECTED

## 2020-06-10 LAB — HEPATITIS PANEL, ACUTE
HCV Ab: REACTIVE — AB
Hep A IgM: NONREACTIVE
Hep B C IgM: NONREACTIVE
Hepatitis B Surface Ag: NONREACTIVE

## 2020-06-10 LAB — OSMOLALITY, URINE: Osmolality, Ur: 319 mOsm/kg (ref 300–900)

## 2020-06-10 LAB — PHOSPHORUS: Phosphorus: 5.4 mg/dL — ABNORMAL HIGH (ref 2.5–4.6)

## 2020-06-10 LAB — SAVE SMEAR(SSMR), FOR PROVIDER SLIDE REVIEW

## 2020-06-10 LAB — LIPASE, BLOOD: Lipase: 32 U/L (ref 11–51)

## 2020-06-10 LAB — OSMOLALITY: Osmolality: 267 mOsm/kg — ABNORMAL LOW (ref 275–295)

## 2020-06-10 LAB — ETHANOL: Alcohol, Ethyl (B): <10 mg/dL (ref <10)

## 2020-06-10 MED ORDER — POTASSIUM CHLORIDE 10 MEQ/100ML IV SOLN
10.0000 meq | INTRAVENOUS | Status: AC
  Administered 2020-06-10 (×3): 10 meq via INTRAVENOUS
  Filled 2020-06-10 (×3): qty 100

## 2020-06-10 MED ORDER — SODIUM CHLORIDE 0.9 % IV SOLN: INTRAVENOUS | Status: DC

## 2020-06-10 MED ORDER — LORAZEPAM 1 MG PO TABS: 1.0000 mg | ORAL_TABLET | ORAL | Status: DC | PRN

## 2020-06-10 MED ORDER — FOLIC ACID 1 MG PO TABS
1.0000 mg | ORAL_TABLET | Freq: Every day | ORAL | Status: DC
  Administered 2020-06-11 – 2020-06-12 (×2): 1 mg via ORAL
  Filled 2020-06-10 (×2): qty 1

## 2020-06-10 MED ORDER — LORAZEPAM 2 MG/ML IJ SOLN
0.0000 mg | Freq: Four times a day (QID) | INTRAMUSCULAR | Status: AC
  Administered 2020-06-10: 22:00:00 2 mg via INTRAVENOUS
  Administered 2020-06-10: 12:00:00 1 mg via INTRAVENOUS
  Administered 2020-06-11: 04:00:00 2 mg via INTRAVENOUS
  Filled 2020-06-10 (×3): qty 1

## 2020-06-10 MED ORDER — THIAMINE HCL 100 MG/ML IJ SOLN
100.0000 mg | Freq: Once | INTRAMUSCULAR | Status: AC
  Administered 2020-06-10: 100 mg via INTRAVENOUS
  Filled 2020-06-10: qty 2

## 2020-06-10 MED ORDER — AMLODIPINE BESYLATE 5 MG PO TABS
5.0000 mg | ORAL_TABLET | Freq: Every day | ORAL | Status: DC
  Administered 2020-06-12: 5 mg via ORAL
  Filled 2020-06-10 (×2): qty 1

## 2020-06-10 MED ORDER — CHLORDIAZEPOXIDE HCL 5 MG PO CAPS
10.0000 mg | ORAL_CAPSULE | Freq: Three times a day (TID) | ORAL | Status: DC
  Administered 2020-06-10 – 2020-06-12 (×5): 10 mg via ORAL
  Filled 2020-06-10 (×5): qty 2

## 2020-06-10 MED ORDER — MAGNESIUM SULFATE 4 GM/100ML IV SOLN
4.0000 g | Freq: Once | INTRAVENOUS | Status: AC
  Administered 2020-06-10: 15:00:00 4 g via INTRAVENOUS
  Filled 2020-06-10: qty 100

## 2020-06-10 MED ORDER — OXYCODONE HCL 5 MG PO TABS
5.0000 mg | ORAL_TABLET | Freq: Four times a day (QID) | ORAL | Status: DC | PRN
  Administered 2020-06-10 (×2): 5 mg via ORAL
  Filled 2020-06-10 (×2): qty 1

## 2020-06-10 MED ORDER — ACETAMINOPHEN 325 MG PO TABS
650.0000 mg | ORAL_TABLET | Freq: Once | ORAL | Status: DC
  Filled 2020-06-10: qty 2

## 2020-06-10 MED ORDER — PANTOPRAZOLE SODIUM 40 MG PO TBEC
40.0000 mg | DELAYED_RELEASE_TABLET | Freq: Every day | ORAL | Status: DC
  Administered 2020-06-11 – 2020-06-12 (×2): 40 mg via ORAL
  Filled 2020-06-10 (×2): qty 1

## 2020-06-10 MED ORDER — POTASSIUM CHLORIDE CRYS ER 20 MEQ PO TBCR: 40.0000 meq | EXTENDED_RELEASE_TABLET | Freq: Once | ORAL | Status: DC

## 2020-06-10 MED ORDER — PHENOBARBITAL SODIUM 65 MG/ML IJ SOLN: 260.0000 mg | Freq: Once | INTRAMUSCULAR | Status: DC

## 2020-06-10 MED ORDER — HYDRALAZINE HCL 20 MG/ML IJ SOLN: 5.0000 mg | INTRAMUSCULAR | Status: DC | PRN

## 2020-06-10 MED ORDER — THIAMINE HCL 100 MG PO TABS
100.0000 mg | ORAL_TABLET | Freq: Every day | ORAL | Status: DC
  Administered 2020-06-11 – 2020-06-12 (×2): 100 mg via ORAL
  Filled 2020-06-10 (×2): qty 1

## 2020-06-10 MED ORDER — NICOTINE 21 MG/24HR TD PT24
21.0000 mg | MEDICATED_PATCH | Freq: Every day | TRANSDERMAL | Status: DC
  Administered 2020-06-11 – 2020-06-12 (×2): 21 mg via TRANSDERMAL
  Filled 2020-06-10 (×2): qty 1

## 2020-06-10 MED ORDER — LORAZEPAM 2 MG/ML IJ SOLN: 0.0000 mg | Freq: Two times a day (BID) | INTRAMUSCULAR | Status: DC

## 2020-06-10 MED ORDER — LORAZEPAM 2 MG/ML IJ SOLN: 1.0000 mg | INTRAMUSCULAR | Status: DC | PRN

## 2020-06-10 MED ORDER — HYDROXYZINE HCL 50 MG/ML IM SOLN
25.0000 mg | Freq: Four times a day (QID) | INTRAMUSCULAR | Status: DC | PRN
  Filled 2020-06-10: qty 0.5

## 2020-06-10 MED ORDER — PHENOBARBITAL SODIUM 130 MG/ML IJ SOLN
260.0000 mg | Freq: Once | INTRAMUSCULAR | Status: AC
  Administered 2020-06-10: 260 mg via INTRAVENOUS

## 2020-06-10 MED ORDER — ADULT MULTIVITAMIN W/MINERALS CH
1.0000 | ORAL_TABLET | Freq: Every day | ORAL | Status: DC
  Administered 2020-06-11 – 2020-06-12 (×2): 1 via ORAL
  Filled 2020-06-10 (×2): qty 1

## 2020-06-10 MED ORDER — ONDANSETRON HCL 4 MG/2ML IJ SOLN: 4.0000 mg | Freq: Three times a day (TID) | INTRAMUSCULAR | Status: DC | PRN

## 2020-06-10 MED ORDER — THIAMINE HCL 100 MG/ML IJ SOLN: 100.0000 mg | Freq: Every day | INTRAMUSCULAR | Status: DC

## 2020-06-10 MED ORDER — DM-GUAIFENESIN ER 30-600 MG PO TB12: 1.0000 | ORAL_TABLET | Freq: Two times a day (BID) | ORAL | Status: DC | PRN

## 2020-06-10 MED ORDER — PANCRELIPASE (LIP-PROT-AMYL) 12000-38000 UNITS PO CPEP
24000.0000 [IU] | ORAL_CAPSULE | Freq: Three times a day (TID) | ORAL | Status: DC
  Administered 2020-06-11 – 2020-06-12 (×4): 24000 [IU] via ORAL
  Filled 2020-06-10 (×7): qty 2

## 2020-06-10 MED ORDER — SODIUM CHLORIDE 0.9 % IV BOLUS: 1000.0000 mL | Freq: Once | INTRAVENOUS | Status: DC

## 2020-06-10 MED ORDER — MUPIROCIN 2 % EX OINT
TOPICAL_OINTMENT | Freq: Every day | CUTANEOUS | Status: DC
  Filled 2020-06-10: qty 22

## 2020-06-10 MED ORDER — OXYCODONE-ACETAMINOPHEN 5-325 MG PO TABS: 1.0000 | ORAL_TABLET | ORAL | Status: DC | PRN

## 2020-06-10 MED ORDER — SODIUM CHLORIDE 0.9 % IV BOLUS
1000.0000 mL | Freq: Once | INTRAVENOUS | Status: AC
  Administered 2020-06-10: 1000 mL via INTRAVENOUS

## 2020-06-10 NOTE — ED Notes (Signed)
Pt states she drink a 12 pack a beer a day.  Her last drink was Sunday 06/07/20.

## 2020-06-10 NOTE — ED Notes (Signed)
Lab called and informed of add-ons for mag and phosphorous.  Lab verbalized understanding of add ons.

## 2020-06-10 NOTE — ED Provider Notes (Signed)
[facility] Emergency Department Provider Note   ____________________________________________   First MD Initiated Contact with Patient 06/10/20 [id number]     (approximate)  I have reviewed the triage vital signs and the nursing notes.   HISTORY  Chief Complaint Fall, Seizures, and Alcohol Problem    HPI Olivia Lester is a 44 y.o. female with a past medical history of alcohol abuse, polysubstance abuse, hepatitis C, and chronic back pain who presents for multiple complaints via EMS.  Patient states that she has had multiple falls over the past 5 days including 1 which resulted in a head injury and laceration that that occurred 5 days prior to arrival.  Patient also complains of multiple bruises from these multiple falls in all extremities.  Patient also complains of "9 seizures in the past 3 days when I stop drinking".  Patient denies any seizure diagnosis but states that she gets alcohol withdrawal seizures and has had to be admitted to the hospital for these in the past.  Patient actively responding to internal stimuli during this interview but can be redirected         Past Medical History:  Diagnosis Date  . Alcohol abuse   . Back injury   . Gastric ulcer   . Gastric ulcer 10/14/2013  . Hepatitis C 10/14/2013  . History of kidney stones   . 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 Pancreatitis, acute   . Panic attack   . Pneumonia   . Polysubstance abuse ([place])   . PONV (postoperative nausea and vomiting)   . Seizures [facility])     Patient Active Problem List   Diagnosis Date Noted  . Sepsis due to Escherichia coli (E. coli) ([place]) 04/20/2020  . Acute pyelonephritis 04/18/2020  . Sepsis ([place]) 04/17/2020  . Fatty liver, alcoholic/Severe hepatic steatosis 01/29/2020  . Pancreatitis, recurrent 01/26/2020  . Colitis 10/28/2019  . Polysubstance abuse ([place]) 10/28/2019  . Hypomagnesemia 10/28/2019  . Alcoholic hepatitis without ascites--Severe  hepatic steatosis 10/28/2019  . Alcoholic gastritis 40/04/2724  . DTs (delirium tremens) ([place])   . Urinary tract infection without hematuria   . Alcohol intoxication ([place]) 09/04/2019  . Alcohol withdrawal ([place]) 06/24/2019  . Sinus tachycardia 06/24/2019  . Essential hypertension 06/24/2019  . Hypokalemia 03/31/2019  . Cannabis abuse 03/11/2019  . Elevated transaminase level   . Alcohol abuse   . Closed displaced fracture of fifth metatarsal bone of right foot 08/02/2018  . Dental abscess 12/25/2017  . Gastric ulcer 10/14/2013  . Transaminitis 10/14/2013  . Tobacco dependence 10/14/2013  . Hepatitis C 10/14/2013  . Thrombocytopenia ([place]) 10/13/2013  . Duodenitis 10/12/2013  . Alcoholism ([place]) 10/12/2013  . Abdominal pain, epigastric 10/12/2013  . ANKLE PAIN 01/01/2008    Past Surgical History:  Procedure Laterality Date  . BACK SURGERY    . BREAST SURGERY    . ESOPHAGOGASTRODUODENOSCOPY (EGD) WITH PROPOFOL N/A 10/14/2013   Procedure: ESOPHAGOGASTRODUODENOSCOPY (EGD) WITH PROPOFOL;  Surgeon: Rogene Houston, MD;  Location: [facility];  Service: Endoscopy;  Laterality: N/A;  . ORIF TOE FRACTURE Right 08/08/2018   Procedure: RIGHT FOOT INTERNAL FIXATION 5TH METATARSAL;  Surgeon: Newt Minion, MD;  Location: [place];  Service: Orthopedics;  Laterality: Right;  . TONSILLECTOMY      Prior to Admission medications   Medication Sig Start Date End Date Taking? Authorizing Provider  acetaminophen (TYLENOL) 325 MG tablet Take 2 tablets (650 mg total) by mouth every 6 (six) hours as  needed for mild pain or headache (or Fever >/= 101). 04/23/20   Barton Dubois, MD  amLODipine (NORVASC) 5 MG tablet Take 1 tablet (5 mg total) by mouth daily. Patient not taking: Reported on 01/26/2020 10/28/19   Roxan Hockey, MD  folic acid (FOLVITE) 1 MG tablet Take 1 tablet (1 mg total) by mouth daily. Patient not taking: Reported on 01/07/2020 10/29/19   Roxan Hockey, MD  lipase/protease/amylase 24000-76000  units CPEP Take 1 capsule (24,000 Units total) by mouth 3 (three) times daily before meals. Patient not taking: Reported on 01/07/2020 10/29/19   Roxan Hockey, MD  Multiple Vitamin (MULTIVITAMIN WITH MINERALS) TABS tablet Take 1 tablet by mouth every other day.  Patient not taking: Reported on 01/07/2020    [provider]  naproxen sodium (ALEVE) 220 MG tablet Take 440 mg by mouth daily as needed.    [provider]  nicotine (NICODERM CQ - DOSED IN MG/24 HOURS) 21 mg/24hr patch Place 1 patch (21 mg total) onto the skin daily. 04/24/20   Barton Dubois, MD  pantoprazole (PROTONIX) 40 MG tablet Take 1 tablet (40 mg total) by mouth daily. 06/03/20 09/01/20  Harvest Dark, MD  permethrin (ELIMITE) 5 % cream Apply from neck down, leaving on for 10 hours then shower.  Repeat in 10 days if symptoms persist Patient not taking: Reported on 03/30/2020 02/17/20   Evalee Jefferson, PA-C  potassium chloride (KLOR-CON) 10 MEQ tablet Take 1 tablet (10 mEq total) by mouth 2 (two) times daily. 06/03/20   Harvest Dark, MD  promethazine (PHENERGAN) 25 MG tablet Take 1 tablet (25 mg total) by mouth every 6 (six) hours as needed for nausea or vomiting. 06/03/20   Harvest Dark, MD  thiamine 100 MG tablet Take 1 tablet (100 mg total) by mouth daily. Patient not taking: Reported on 01/07/2020 10/29/19   Roxan Hockey, MD    Allergies Toradol [ketorolac tromethamine] and Asa [aspirin]  Family History  Problem Relation Age of Onset  . Cancer Mother 57       ovarian   . Hypertension Mother   . Stroke Mother   . Early death Father   . Alcohol abuse Father     Social History Social History   Tobacco Use  . Smoking status: Current Every Day Smoker    Packs/day: 1.00    Years: 20.00    Pack years: 20.00    Types: Cigarettes  . Smokeless tobacco: Never Used  Vaping Use  . Vaping Use: Never used  Substance Use Topics  . Alcohol use: Yes    Alcohol/week: 18.0 standard drinks     Types: 18 Cans of beer per week    Comment: 1/5 -1/2 gallon of liqour daily or 18 beers daily  . Drug use: Yes    Types: Marijuana, Cocaine, Methamphetamines    Comment: denies current cocaine/meth...current mj    Review of Systems Constitutional: No fever/chills Eyes: No visual changes. ENT: No sore throat. Cardiovascular: Denies chest pain. Respiratory: Denies shortness of breath. Gastrointestinal: No abdominal pain.  No nausea, no vomiting.  No diarrhea. Genitourinary: Negative for dysuria. Musculoskeletal: Negative for acute arthralgias Skin: Negative for rash. Neurological: Positive for visual hallucinations.  weakness/numbness/paresthesias in any extremity Psychiatric: Negative for suicidal ideation/homicidal ideation   ____________________________________________   PHYSICAL EXAM:  VITAL SIGNS: ED Triage Vitals  Enc Vitals Group     BP 06/10/20 0757 (!) 174/115     Pulse Rate 06/10/20 0757 (!) 108     Resp 06/10/20 0757  20     Temp 06/10/20 0757 98 F (36.7 C)     Temp Source 06/10/20 0757 Oral     SpO2 06/10/20 0757 98 %     Weight 06/10/20 0758 100 lb (45.4 kg)     Height 06/10/20 0758 5\' 7"  (1.702 m)     Head Circumference --      Peak Flow --      Pain Score 06/10/20 0758 10     Pain Loc --      Pain Edu? --      Excl. in [place]? --    Constitutional: Alert and oriented.  Disheveled. Eyes: Conjunctivae are injected. PERRL. Head: 3 cm laceration with surrounding edema and hematoma to the upper portion of the right forehead, hemostatic Nose: No congestion/rhinnorhea. Mouth/Throat: Mucous membranes are moist. Neck: No stridor Cardiovascular: Grossly normal heart sounds.  Good peripheral circulation. Respiratory: Normal respiratory effort.  No retractions. Gastrointestinal: Soft and nontender. No distention. Musculoskeletal: No obvious deformities Neurologic:  Normal speech and language. No gross focal neurologic deficits are appreciated. Skin:  Skin is warm  and dry. No rash noted.  Right forehead laceration Psychiatric: Mood and affect are erratic. Speech and behavior are pressured.  Actively responding to internal stimuli  ____________________________________________   LABS (all labs ordered are listed, but only abnormal results are displayed)  Labs Reviewed  COMPREHENSIVE METABOLIC PANEL - Abnormal; Notable for the following components:      Result Value   Sodium 125 (*)    Potassium 3.1 (*)    Chloride 77 (*)    Creatinine, Ser 1.57 (*)    Total Protein 8.6 (*)    AST 325 (*)    ALT 105 (*)    Total Bilirubin 1.9 (*)    GFR, Estimated 41 (*)    Anion gap 23 (*)    All other components within normal limits  CBC WITH DIFFERENTIAL/PLATELET - Abnormal; Notable for the following components:   WBC 11.4 (*)    Hemoglobin 16.9 (*)    HCT 46.2 (*)    MCH 34.5 (*)    MCHC 36.6 (*)    Platelets 82 (*)    Neutro Abs 9.3 (*)    Abs Immature Granulocytes 0.10 (*)    All other components within normal limits  RESPIRATORY PANEL BY RT PCR (FLU A&B, COVID)  ETHANOL  LIPASE, BLOOD  URINE DRUG SCREEN, QUALITATIVE ([facility] ONLY)  URINALYSIS, COMPLETE (UACMP) WITH MICROSCOPIC   ____________________________________________  EKG  ED ECG REPORT I, Naaman Plummer, the attending physician, personally viewed and interpreted this ECG.  Date: 06/10/2020 EKG Time: 0810 Rate: 104 Rhythm: Tachycardic sinus rhythm QRS Axis: normal Intervals: normal ST/T Wave abnormalities: normal Narrative Interpretation: no evidence of acute ischemia  ____________________________________________  RADIOLOGY  ED MD interpretation: CT of the head without contrast shows no acute intracranial hemorrhage or fracture but does show a small scalp hematoma along the right frontal bone consistent with the observed trauma on physical exam  Official radiology report(s): CT Head Wo Contrast  Result Date: 06/10/2020 CLINICAL DATA:  Seizures.  Multiple falls. EXAM: CT  HEAD WITHOUT CONTRAST TECHNIQUE: Contiguous axial images were obtained from the base of the skull through the vertex without intravenous contrast. COMPARISON:  03/12/2020 FINDINGS: Brain: No evidence of acute infarction, hemorrhage, hydrocephalus, extra-axial collection or mass lesion/mass effect. Vascular: No hyperdense vessel or unexpected calcification. Skull: Normal. Negative for fracture or focal lesion. Sinuses/Orbits: Minimal mucosal disease in the visualized right maxillary sinus. Minimal  disease involving the left frontal ethmoidal region. Other: Soft tissue prominence along the right frontal bone is suggestive for a small scalp hematoma. No underlying fracture. IMPRESSION: 1. No acute intracranial abnormality. 2. Small scalp hematoma along the right frontal bone. No underlying fracture. Electronically Signed   By: Markus Daft M.D.   On: 06/10/2020 08:55    ____________________________________________   PROCEDURES  Procedure(s) performed (including Critical Care):  .1-3 Lead EKG Interpretation Performed by: Naaman Plummer, MD Authorized by: Naaman Plummer, MD     Interpretation: normal     ECG rate:  85   ECG rate assessment: normal     Rhythm: sinus rhythm     Ectopy: none     Conduction: normal   .Critical Care Performed by: Naaman Plummer, MD Authorized by: Naaman Plummer, MD   Critical care provider statement:    Critical care time (minutes):  37   Critical care time was exclusive of:  Separately billable procedures and treating other patients   Critical care was necessary to treat or prevent imminent or life-threatening deterioration of the following conditions:  Dehydration and CNS failure or compromise   Critical care was time spent personally by me on the following activities:  Discussions with consultants, evaluation of patient's response to treatment, examination of patient, ordering and performing treatments and interventions, ordering and review of laboratory  studies, ordering and review of radiographic studies, pulse oximetry, re-evaluation of patient's condition, obtaining history from patient or surrogate and review of old charts   I assumed direction of critical care for this patient from another provider in my specialty: no       ____________________________________________   INITIAL IMPRESSION / ASSESSMENT AND PLAN / ED COURSE  As part of my medical decision making, I reviewed the following data within the [place] notes reviewed and incorporated, Labs reviewed, EKG interpreted, Old chart reviewed, Radiograph reviewed and Notes from prior ED visits reviewed and incorporated       44 year old female with a past medical history of polysubstance abuse including significant alcohol abuse and history of withdrawal seizures who presents for seizure activity after alcohol cessation including head trauma after multiple falls + recent reduction in alcohol use from chronic abuse + anxiety +nausea + tachycardia + tremor + diaphoresis Denies tactile, auditory, and visual hallucinations. AAOx3. Denies history of withdrawal seizures, ICU admissions, DTs.  Workup: Including but not limited to POCT glucose, CBC, BMP Interventions: Phenobarbital IV  Given History, Exam, and Workup this patient appears to be suffering from alcohol withdrawal. Presentation not consistent with infectious etiology, thyrotoxicosis, Coingestion toxidrome/withdrawal, primary psychologic disorder such as anxiety, Severe Metabolic Derangement (hypoglycemia, alcoholic ketoacidosis, or other e-lyte abnormality).  Given patient's history of complicated alcohol withdrawal, patient will require admission to the internal medicine service for further evaluation and management.  Disposition: Admit to medicine      ____________________________________________   FINAL CLINICAL IMPRESSION(S) / ED DIAGNOSES  Final diagnoses:  Perceptual disturbances  and seizures concurrent with and due to alcohol withdrawal (HCC)  Laceration of forehead, initial encounter  Multiple falls     ED Discharge Orders    None       Note:  This document was prepared using Dragon voice recognition software and may include unintentional dictation errors.   Naaman Plummer, MD 06/10/20 1043

## 2020-06-10 NOTE — ED Notes (Signed)
ED Provider at bedside. 

## 2020-06-10 NOTE — ED Triage Notes (Signed)
Pt comes into the ED via [facility] from home c/o seizures x9 within 3 days, multiple falls, alcohol withdrawals, laceration to the head that is 2 days old, multiple bruises from falls, and productive cough.  Pt is alert and oriented at this time.  PT does present with hematoma and laceration to the head.  Pt denies a seizure disorder, but admits to multiple seizures when withdrawing from alcohol. CBG 111, HTN with EMS, and afebrile.

## 2020-06-10 NOTE — ED Notes (Signed)
Pt transported to CT ?

## 2020-06-10 NOTE — Progress Notes (Signed)
Patient arrived from ER in stable, but lethargic condition.

## 2020-06-10 NOTE — Progress Notes (Signed)
Patient refused to complete admission profile. Pt currently having hallucinations and periods of lethargy.

## 2020-06-10 NOTE — Progress Notes (Signed)
PT Cancellation Note  Patient Details Name: Olivia Lester MRN: [id number] DOB: [birth year]   Cancelled Treatment:    Reason Eval/Treat Not Completed: Medical issues which prohibited therapy (Per chart review, pt with hypoK+: in 2s, outside safe range for PT at this. Will continue to follow remotely and evaluate once medically appropriate.)  3:00 PM, 06/10/20 Etta Grandchild, PT, DPT Physical Therapist - [facility]  [phone] ([place])    Bladen Umar C 06/10/2020, 3:00 PM

## 2020-06-10 NOTE — H&P (Addendum)
History and Physical    Olivia Lester NLG:[id number] DOB: [birth year] DOA: 06/10/2020  Referring MD/NP/PA:   PCP: Patient, No Pcp Per   Patient coming from:  The patient is coming from home.  At baseline, pt is independent for most of ADL.        Chief Complaint: seizure and fall  HPI: Olivia Lester is a 44 y.o. female with medical history significant of alcohol withdrawal seizure, polysubstance abuse, tobacco abuse, hypertension, GERD, panic attack, pancreatitis, HCV, gastritis, thrombocytopenia, GERD, who presents with seizure and fall.  Patient states that she stopped drinking 3 days ago.  She has had 9 seizures in the past 3 days. Per EDP. Pt had visual hallucination.  She states that she has had multiple falls in the past several days, including 1 which resulted in a head injury and right frontal ski laceration that that occurred 5 days prior to arrival.  She has mild to dry cough and mild shortness of breath.  She states that she has right lower rib cage pain.  She also reported nausea and vomiting.  She has had several times of nonbilious nonbloody vomiting. She initially said that she had diarrhea, then states that she did not have diarrhea.  No symptoms of UTI.  No unilateral numbness or tingling in the extremities.  No facial droop or slurred speech.  Denies suicidal homicidal ideations.  ED Course: pt was found to have WBC 11.4, lipase 32, negative COVID-19 PCR, alcohol level less than 10, AKI with creatinine 1.57, BUN 20, potassium 3.1, temperature normal, blood pressure 141/90, heart rate 108, RR 20, oxygen saturation 99% on room air.  Pending chest x-ray.  CT head is negative for acute intracranial abnormalities, but showed right frontal hematoma.  Patient is placed on MedSurg bed for observation   Review of Systems:   General: no fevers, chills, no body weight gain, has poor appetite, has fatigue HEENT: no blurry vision, hearing changes or sore throat Respiratory: has dyspnea, coughing,  no wheezing CV: has right lower rib cage pain, no palpitations GI: has nausea, vomiting, abdominal pain, no diarrhea, no constipation GU: no dysuria, burning on urination, increased urinary frequency, hematuria  Ext: no leg edema Neuro: no unilateral weakness, numbness, or tingling, no vision change or hearing loss. Has seizure and fall. Skin: no rash. Has right frontal skin laceration. MSK: No muscle spasm, no deformity, no limitation of range of movement in spin Heme: No easy bruising.  Travel history: No recent long distant travel.  Allergy:  Allergies  Allergen Reactions  . Toradol [Ketorolac Tromethamine] Other (See Comments)    Patient advised not to take due to ulcers  . Asa [Aspirin] Rash    Past Medical History:  Diagnosis Date  . Alcohol abuse   . Back injury   . Gastric ulcer   . Gastric ulcer 10/14/2013  . Hepatitis C 10/14/2013  . History of kidney stones   . 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 Pancreatitis, acute   . Panic attack   . Pneumonia   . Polysubstance abuse ([place])   . PONV (postoperative nausea and vomiting)   . Seizures ([place])     Past Surgical History:  Procedure Laterality Date  . BACK SURGERY    . BREAST SURGERY    . ESOPHAGOGASTRODUODENOSCOPY (EGD) WITH PROPOFOL N/A 10/14/2013   Procedure: ESOPHAGOGASTRODUODENOSCOPY (EGD) WITH PROPOFOL;  Surgeon: Rogene Houston, MD;  Location: [facility];  Service: Endoscopy;  Laterality: N/A;  .  ORIF TOE FRACTURE Right 08/08/2018   Procedure: RIGHT FOOT INTERNAL FIXATION 5TH METATARSAL;  Surgeon: Newt Minion, MD;  Location: [facility];  Service: Orthopedics;  Laterality: Right;  . TONSILLECTOMY      Social History:  reports that she has been smoking cigarettes. She has a 20.00 pack-year smoking history. She has never used smokeless tobacco. She reports current alcohol use of about 18.0 standard drinks of alcohol per week. She reports current drug use. Drugs: Marijuana, Cocaine, and  Methamphetamines.  Family History:  Family History  Problem Relation Age of Onset  . Cancer Mother 50       ovarian   . Hypertension Mother   . Stroke Mother   . Early death Father   . Alcohol abuse Father      Prior to Admission medications   Medication Sig Start Date End Date Taking? Authorizing Provider  acetaminophen (TYLENOL) 325 MG tablet Take 2 tablets (650 mg total) by mouth every 6 (six) hours as needed for mild pain or headache (or Fever >/= 101). 04/23/20   Barton Dubois, MD  amLODipine (NORVASC) 5 MG tablet Take 1 tablet (5 mg total) by mouth daily. Patient not taking: Reported on 01/26/2020 10/28/19   Roxan Hockey, MD  folic acid (FOLVITE) 1 MG tablet Take 1 tablet (1 mg total) by mouth daily. Patient not taking: Reported on 01/07/2020 10/29/19   Roxan Hockey, MD  lipase/protease/amylase 24000-76000 units CPEP Take 1 capsule (24,000 Units total) by mouth 3 (three) times daily before meals. Patient not taking: Reported on 01/07/2020 10/29/19   Roxan Hockey, MD  Multiple Vitamin (MULTIVITAMIN WITH MINERALS) TABS tablet Take 1 tablet by mouth every other day.  Patient not taking: Reported on 01/07/2020    [provider]  naproxen sodium (ALEVE) 220 MG tablet Take 440 mg by mouth daily as needed.    [provider]  nicotine (NICODERM CQ - DOSED IN MG/24 HOURS) 21 mg/24hr patch Place 1 patch (21 mg total) onto the skin daily. 04/24/20   Barton Dubois, MD  pantoprazole (PROTONIX) 40 MG tablet Take 1 tablet (40 mg total) by mouth daily. 06/03/20 09/01/20  Harvest Dark, MD  permethrin (ELIMITE) 5 % cream Apply from neck down, leaving on for 10 hours then shower.  Repeat in 10 days if symptoms persist Patient not taking: Reported on 03/30/2020 02/17/20   Evalee Jefferson, PA-C  potassium chloride (KLOR-CON) 10 MEQ tablet Take 1 tablet (10 mEq total) by mouth 2 (two) times daily. 06/03/20   Harvest Dark, MD  promethazine (PHENERGAN) 25 MG tablet Take 1 tablet  (25 mg total) by mouth every 6 (six) hours as needed for nausea or vomiting. 06/03/20   Harvest Dark, MD  thiamine 100 MG tablet Take 1 tablet (100 mg total) by mouth daily. Patient not taking: Reported on 01/07/2020 10/29/19   Roxan Hockey, MD    Physical Exam: Vitals:   06/10/20 1100 06/10/20 1211 06/10/20 1330 06/10/20 1620  BP: (!) 144/104 (!) 143/107 (!) 143/80 123/86  Pulse: 83 84  90  Resp:  16  (!) 22  Temp:  98.4 F (36.9 C)  97.8 F (36.6 C)  TempSrc:  Oral  Axillary  SpO2: 100% 100%  98%  Weight:      Height:       General: Not in acute distress HEENT:       Eyes: PERRL, EOMI, no scleral icterus.       ENT: No discharge from the ears and nose, no  pharynx injection, no tonsillar enlargement.        Neck: No JVD, no bruit, no mass felt. Heme: No neck lymph node enlargement. Cardiac: S1/S2, RRR, No murmurs, No gallops or rubs. Chest wall: has right lower rib cage tenderness Respiratory: No rales, wheezing, rhonchi or rubs. GI: Soft, nondistended, nontender, no rebound pain, no organomegaly, BS present. GU: No hematuria Ext: No pitting leg edema bilaterally. 1+DP/PT pulse bilaterally. Musculoskeletal: No joint deformities, No joint redness or warmth, no limitation of ROM in spin. Skin: No rashes. Has right frontal head skin laceration Neuro: Alert, oriented X3, cranial nerves II-XII grossly intact, moves all extremities normally.  Psych: Patient is not psychotic, no suicidal or hemocidal ideation.  Labs on Admission: I have personally reviewed following labs and imaging studies  CBC: Recent Labs  Lab 06/10/20 0802  WBC 11.4*  NEUTROABS 9.3*  HGB 16.9*  HCT 46.2*  MCV 94.3  PLT 82*   Basic Metabolic Panel: Recent Labs  Lab 06/10/20 0802 06/10/20 1307  NA 125* 128*  K 3.1* 2.8*  CL 77* 86*  CO2 25 25  GLUCOSE 83 75  BUN 20 18  CREATININE 1.57* 0.83  CALCIUM 9.7 7.8*  MG 1.3*  --   PHOS 5.4*  --    GFR: Estimated Creatinine Clearance: 62  mL/min (by C-G formula based on SCr of 0.83 mg/dL). Liver Function Tests: Recent Labs  Lab 06/10/20 0802  AST 325*  ALT 105*  ALKPHOS 95  BILITOT 1.9*  PROT 8.6*  ALBUMIN 4.9   Recent Labs  Lab 06/10/20 0802  LIPASE 32   No results for input(s): AMMONIA in the last 168 hours. Coagulation Profile: No results for input(s): INR, PROTIME in the last 168 hours. Cardiac Enzymes: No results for input(s): CKTOTAL, CKMB, CKMBINDEX, TROPONINI in the last 168 hours. BNP (last 3 results) No results for input(s): PROBNP in the last 8760 hours. HbA1C: No results for input(s): HGBA1C in the last 72 hours. CBG: No results for input(s): GLUCAP in the last 168 hours. Lipid Profile: No results for input(s): CHOL, HDL, LDLCALC, TRIG, CHOLHDL, LDLDIRECT in the last 72 hours. Thyroid Function Tests: No results for input(s): TSH, T4TOTAL, FREET4, T3FREE, THYROIDAB in the last 72 hours. Anemia Panel: No results for input(s): VITAMINB12, FOLATE, FERRITIN, TIBC, IRON, RETICCTPCT in the last 72 hours. Urine analysis:    Component Value Date/Time   COLORURINE YELLOW (A) 06/10/2020 1157   APPEARANCEUR CLEAR (A) 06/10/2020 1157   APPEARANCEUR Clear 12/07/2013 0133   LABSPEC 1.013 06/10/2020 1157   LABSPEC 1.006 12/07/2013 0133   PHURINE 6.0 06/10/2020 1157   GLUCOSEU NEGATIVE 06/10/2020 1157   GLUCOSEU Negative 12/07/2013 0133   HGBUR LARGE (A) 06/10/2020 1157   BILIRUBINUR NEGATIVE 06/10/2020 1157   BILIRUBINUR Negative 12/07/2013 0133   KETONESUR 20 (A) 06/10/2020 1157   PROTEINUR 30 (A) 06/10/2020 1157   UROBILINOGEN 0.2 04/28/2015 1559   NITRITE NEGATIVE 06/10/2020 1157   LEUKOCYTESUR NEGATIVE 06/10/2020 1157   LEUKOCYTESUR Negative 12/07/2013 0133   Sepsis Labs: @LABRCNTIP (procalcitonin:4,lacticidven:4) ) Recent Results (from the past 240 hour(s))  Respiratory Panel by RT PCR (Flu A&B, Covid) - Nasopharyngeal Swab     Status: None   Collection Time: 06/03/20 10:54 AM   Specimen:  Nasopharyngeal Swab  Result Value Ref Range Status   SARS Coronavirus 2 by RT PCR NEGATIVE NEGATIVE Final    Comment: (NOTE) SARS-CoV-2 target nucleic acids are NOT DETECTED.  The SARS-CoV-2 RNA is generally detectable in upper respiratoy specimens during  the acute phase of infection. The lowest concentration of SARS-CoV-2 viral copies this assay can detect is 131 copies/mL. A negative result does not preclude SARS-Cov-2 infection and should not be used as the sole basis for treatment or other patient management decisions. A negative result may occur with  improper specimen collection/handling, submission of specimen other than nasopharyngeal swab, presence of viral mutation(s) within the areas targeted by this assay, and inadequate number of viral copies (<131 copies/mL). A negative result must be combined with clinical observations, patient history, and epidemiological information. The expected result is Negative.  Fact Sheet for Patients:  PinkCheek.be  Fact Sheet for Healthcare Providers:  GravelBags.it  This test is no t yet approved or cleared by the [place] FDA and  has been authorized for detection and/or diagnosis of SARS-CoV-2 by FDA under an Emergency Use Authorization (EUA). This EUA will remain  in effect (meaning this test can be used) for the duration of the COVID-19 declaration under Section 564(b)(1) of the Act, 21 U.S.C. section 360bbb-3(b)(1), unless the authorization is terminated or revoked sooner.     Influenza A by PCR NEGATIVE NEGATIVE Final   Influenza B by PCR NEGATIVE NEGATIVE Final    Comment: (NOTE) The Xpert Xpress SARS-CoV-2/FLU/RSV assay is intended as an aid in  the diagnosis of influenza from Nasopharyngeal swab specimens and  should not be used as a sole basis for treatment. Nasal washings and  aspirates are unacceptable for Xpert Xpress SARS-CoV-2/FLU/RSV  testing.  Fact Sheet  for Patients: PinkCheek.be  Fact Sheet for Healthcare Providers: GravelBags.it  This test is not yet approved or cleared by the [place] FDA and  has been authorized for detection and/or diagnosis of SARS-CoV-2 by  FDA under an Emergency Use Authorization (EUA). This EUA will remain  in effect (meaning this test can be used) for the duration of the  Covid-19 declaration under Section 564(b)(1) of the Act, 21  U.S.C. section 360bbb-3(b)(1), unless the authorization is  terminated or revoked. Performed at [facility], [address] [place], [place] [address]   Respiratory Panel by RT PCR (Flu A&B, Covid) - Nasopharyngeal Swab     Status: None   Collection Time: 06/10/20  9:36 AM   Specimen: Nasopharyngeal Swab  Result Value Ref Range Status   SARS Coronavirus 2 by RT PCR NEGATIVE NEGATIVE Final    Comment: (NOTE) SARS-CoV-2 target nucleic acids are NOT DETECTED.  The SARS-CoV-2 RNA is generally detectable in upper respiratoy specimens during the acute phase of infection. The lowest concentration of SARS-CoV-2 viral copies this assay can detect is 131 copies/mL. A negative result does not preclude SARS-Cov-2 infection and should not be used as the sole basis for treatment or other patient management decisions. A negative result may occur with  improper specimen collection/handling, submission of specimen other than nasopharyngeal swab, presence of viral mutation(s) within the areas targeted by this assay, and inadequate number of viral copies (<131 copies/mL). A negative result must be combined with clinical observations, patient history, and epidemiological information. The expected result is Negative.  Fact Sheet for Patients:  PinkCheek.be  Fact Sheet for Healthcare Providers:  GravelBags.it  This test is no t yet approved or cleared by the  [place] FDA and  has been authorized for detection and/or diagnosis of SARS-CoV-2 by FDA under an Emergency Use Authorization (EUA). This EUA will remain  in effect (meaning this test can be used) for the duration of the COVID-19 declaration under Section 564(b)(1) of  the Act, 21 U.S.C. section 360bbb-3(b)(1), unless the authorization is terminated or revoked sooner.     Influenza A by PCR NEGATIVE NEGATIVE Final   Influenza B by PCR NEGATIVE NEGATIVE Final    Comment: (NOTE) The Xpert Xpress SARS-CoV-2/FLU/RSV assay is intended as an aid in  the diagnosis of influenza from Nasopharyngeal swab specimens and  should not be used as a sole basis for treatment. Nasal washings and  aspirates are unacceptable for Xpert Xpress SARS-CoV-2/FLU/RSV  testing.  Fact Sheet for Patients: PinkCheek.be  Fact Sheet for Healthcare Providers: GravelBags.it  This test is not yet approved or cleared by the [place] FDA and  has been authorized for detection and/or diagnosis of SARS-CoV-2 by  FDA under an Emergency Use Authorization (EUA). This EUA will remain  in effect (meaning this test can be used) for the duration of the  Covid-19 declaration under Section 564(b)(1) of the Act, 21  U.S.C. section 360bbb-3(b)(1), unless the authorization is  terminated or revoked. Performed at [facility], [address] [place], [place] [address]      Radiological Exams on Admission: DG Chest 2 View  Result Date: 06/10/2020 CLINICAL DATA:  Pain following fall EXAM: CHEST - 2 VIEW COMPARISON:  April 30, 2020. FINDINGS: There is minimal scarring in the left base. The lungs elsewhere are clear. The heart size and pulmonary vascularity are normal. No adenopathy. No bone lesions. No pneumothorax. IMPRESSION: Mild scarring left base.  Lungs elsewhere clear.  Heart size normal. Electronically Signed   By: Lowella Grip III M.D.   On:  06/10/2020 13:08   CT Head Wo Contrast  Result Date: 06/10/2020 CLINICAL DATA:  Seizures.  Multiple falls. EXAM: CT HEAD WITHOUT CONTRAST TECHNIQUE: Contiguous axial images were obtained from the base of the skull through the vertex without intravenous contrast. COMPARISON:  03/12/2020 FINDINGS: Brain: No evidence of acute infarction, hemorrhage, hydrocephalus, extra-axial collection or mass lesion/mass effect. Vascular: No hyperdense vessel or unexpected calcification. Skull: Normal. Negative for fracture or focal lesion. Sinuses/Orbits: Minimal mucosal disease in the visualized right maxillary sinus. Minimal disease involving the left frontal ethmoidal region. Other: Soft tissue prominence along the right frontal bone is suggestive for a small scalp hematoma. No underlying fracture. IMPRESSION: 1. No acute intracranial abnormality. 2. Small scalp hematoma along the right frontal bone. No underlying fracture. Electronically Signed   By: Markus Daft M.D.   On: 06/10/2020 08:55     EKG:  Not done in ED, will get one.   Assessment/Plan Principal Problem:   Seizure [facility]) Active Problems:   Thrombocytopenia (HCC)   Tobacco dependence   Hypokalemia   Alcohol withdrawal (HCC)   Essential hypertension   Polysubstance abuse ([place])   Hypomagnesemia   Abnormal LFTs   AKI (acute kidney injury) ([place])   Fall   Hyponatremia   Seizure: likely due to alcohol withdrawal seizure.  Patient stopped drinking alcohol 3 days ago.  Alcohol level less than 10.  Patient received to 60 mg of Luminal in ED. CT-head negative for acute intracranial abnormalities.  No focal deficit on physical examination.  -Please med-surg bed for obs -Seizure precaution -Librium 10 mg tid -When necessary Ativan for seizure  Fall: likely due to alcohol abuse.  CT head negative for acute intracranial abnormalities. -Fall precaution -PT/OT -Wound care consult for right frontal head skin laceration -f/u CXR due to right lower  rib cage pain  Thrombocytopenia ([place]): This is chronic issue, likely due to alcohol abuse -Check LDH, peripheral  smear -f/u by CBC  Polysubstance abuse,  Tobacco dependence, alcohol withdrawal: -check UDS -Nicotine patch -on CiWA protocol  Essential hypertension: -IV hydralazine as needed -continue home amlodipine  Abnormal LFTs: ALP 95, AST 325, ALT 105, total bilirubin 1.9.  Likely multifactorial etiology.  Patient has history of hepatitis C and alcohol abuse -Avoid using Tylenol -Check hepatitis panel and HIV antibody  AKI (acute kidney injury) ([place]):  -IVF: 1L of NS and then 75 cc/h  Hyponatremia: Sodium 125.  Likely due to alcohol abuse, decreased oral intake, and Nausea, vomiting, diarrhea -IV fluid as above -BMP q8h - Will check urine sodium, urine osmolality, serum osmolality. - check TSH - avoid over correction too fast due to risk of central pontine myelinolysis  Hypokalemia and hypomagnesemia : K= 3.1 and Mg 1.3  on admission. - Repleted both - Give 4 g of magnesium sulfate - give 40 mEq of KCl orally and 10 mEQ x 4 by IV -check Phosphrous level   DVT ppx: SCD Code Status: Full code Family Communication: not done, no family member is at bed side.   Disposition Plan:  Anticipate discharge back to previous environment Consults called:  None Admission status: Med-surg bed for obs  Status is: Observation  The patient remains OBS appropriate and will d/c before 2 midnights.  Dispo: The patient is from: Home              Anticipated d/c is to: Home              Anticipated d/c date is: 1 day              Patient currently is not medically stable to d/c.             Date of Service 06/10/2020    [facility]   If 7PM-7AM, please contact night-coverage [email or web address] 06/10/2020, 5:20 PM

## 2020-06-10 NOTE — Consult Note (Signed)
[facility] Nurse Consult Note: Reason for Consult:Right anterior forehead laceration. Multiple falls and seizures wound is more than 2 days old.  Wound type:trauma Pressure Injury POA: NA Measurement: 3 cm laceration with scabbed, nonapproximated edges.  Wound YME:BRAXENM Drainage (amount, consistency, odor) minimal serosanguinous  No odor.  Periwound:edema and erythema Bruising to arms and legs from falls and trauma Dressing procedure/placement/frequency: cleanse head wound with NS and pat dry.  Apply mupirocin ointment to wound bed. Cover with dry dressing. Change daily.  Will not follow at this time.  Please re-consult if needed.  Domenic Moras MSN, RN, FNP-BC CWON Wound, Ostomy, Continence Nurse Pager [phone]

## 2020-06-11 LAB — CBC
HCT: 37.7 % (ref 36.0–46.0)
Hemoglobin: 13.5 g/dL (ref 12.0–15.0)
MCH: 35.1 pg — ABNORMAL HIGH (ref 26.0–34.0)
MCHC: 35.8 g/dL (ref 30.0–36.0)
MCV: 97.9 fL (ref 80.0–100.0)
Platelets: 66 10*3/uL — ABNORMAL LOW (ref 150–400)
RBC: 3.85 MIL/uL — ABNORMAL LOW (ref 3.87–5.11)
RDW: 14.5 % (ref 11.5–15.5)
WBC: 6.8 10*3/uL (ref 4.0–10.5)
nRBC: 0 % (ref 0.0–0.2)

## 2020-06-11 LAB — COMPREHENSIVE METABOLIC PANEL
ALT: 60 U/L — ABNORMAL HIGH (ref 0–44)
AST: 184 U/L — ABNORMAL HIGH (ref 15–41)
Albumin: 3.2 g/dL — ABNORMAL LOW (ref 3.5–5.0)
Alkaline Phosphatase: 64 U/L (ref 38–126)
Anion gap: 14 (ref 5–15)
BUN: 23 mg/dL — ABNORMAL HIGH (ref 6–20)
CO2: 28 mmol/L (ref 22–32)
Calcium: 8.8 mg/dL — ABNORMAL LOW (ref 8.9–10.3)
Chloride: 88 mmol/L — ABNORMAL LOW (ref 98–111)
Creatinine, Ser: 0.78 mg/dL (ref 0.44–1.00)
GFR, Estimated: >60 mL/min (ref >60)
Glucose, Bld: 87 mg/dL (ref 70–99)
Potassium: 3.1 mmol/L — ABNORMAL LOW (ref 3.5–5.1)
Sodium: 130 mmol/L — ABNORMAL LOW (ref 135–145)
Total Bilirubin: 1.6 mg/dL — ABNORMAL HIGH (ref 0.3–1.2)
Total Protein: 5.9 g/dL — ABNORMAL LOW (ref 6.5–8.1)

## 2020-06-11 LAB — PATHOLOGIST SMEAR REVIEW

## 2020-06-11 LAB — TSH: TSH: 2.281 u[IU]/mL (ref 0.350–4.500)

## 2020-06-11 MED ORDER — POTASSIUM CHLORIDE 20 MEQ PO PACK
20.0000 meq | PACK | Freq: Two times a day (BID) | ORAL | Status: AC
  Administered 2020-06-11 (×2): 20 meq via ORAL
  Filled 2020-06-11 (×2): qty 1

## 2020-06-11 MED ORDER — ONDANSETRON HCL 4 MG/2ML IJ SOLN
4.0000 mg | Freq: Four times a day (QID) | INTRAMUSCULAR | Status: DC | PRN
  Administered 2020-06-11: 4 mg via INTRAVENOUS
  Filled 2020-06-11: qty 2

## 2020-06-11 MED ORDER — TRAMADOL HCL 50 MG PO TABS
50.0000 mg | ORAL_TABLET | Freq: Four times a day (QID) | ORAL | Status: DC | PRN
  Administered 2020-06-11 – 2020-06-12 (×2): 50 mg via ORAL
  Filled 2020-06-11 (×4): qty 1

## 2020-06-11 NOTE — Evaluation (Signed)
Occupational Therapy Evaluation Patient Details Name: Olivia Lester MRN: [id number] DOB: [birth year] Today's Date: 06/11/2020    History of Present Illness Olivia Lester is a 44 y.o. female with medical history significant of alcohol withdrawal seizure, polysubstance abuse, tobacco abuse, hypertension, GERD, panic attack, pancreatitis, HCV, gastritis, thrombocytopenia, GERD, who presents with seizure and fall. Patient states that she stopped drinking 3 days ago.  She has had 9 seizures in the past 3 days. Per EDP. Pt had visual hallucination.  She states that she has had multiple falls in the past several days, including 1 which resulted in a head injury and right frontal ski laceration that that occurred 5 days prior to arrival.  She has mild to dry cough and mild shortness of breath.  She states that she has right lower rib cage pain.  She also reported nausea and vomiting.  She has had several times of nonbilious nonbloody vomiting. She initially said that she had diarrhea, then states that she did not have diarrhea.  No symptoms of UTI.  No unilateral numbness or tingling in the extremities.  No facial droop or slurred speech.  Denies suicidal homicidal ideations.   Clinical Impression   Olivia Lester presents today as uncomfortable, withdrawn, and in pain. She described pain in R lower rib cage as "the worst pain ever" and that it was exacerbated with movement. She was nevertheless able to perform bed mobility and t/f with Mod I, requiring increased time. Able to come to standing from low-rise bed without AD or assist. Prior to the OTR's visit, pt had eaten a small amount of breakfast, and stated that this "was the first time I've eaten in a week." Pt reports that she does not drive, is not employed, and that she has been doing drugs for around 16 years. She states that she has tried detox programs before, and may consider again in the future, but is not interested in such at present. Prior to this hospitalization,  pt had been living with her boyfriend, and is unsure if she will go back there after D/C. She says she needs to speak with him and "figure out how things are between [place]." She denies hx of domestic violence. If she does not return to her boyfriend's home, she says she "does not know" where else she could go. Although it was difficult to evaluate pt's condition at present, given her reluctance to participate in session, she appears to be IND in ADLs. No further OT recommended at this time.      Follow Up Recommendations  No OT follow up    Equipment Recommendations  None recommended by OT    Recommendations for Other Services       Precautions / Restrictions Precautions Precautions: Fall;Other (comment) (seizures) Restrictions Weight Bearing Restrictions: No      Mobility Bed Mobility Overal bed mobility: Modified Independent             General bed mobility comments: Requires increased time, but able to perform bed mobility IND'ly, from low-rise bed    Transfers Overall transfer level: Modified independent                    Balance Overall balance assessment: No apparent balance deficits (not formally assessed)                                         ADL  either performed or assessed with clinical judgement   ADL Overall ADL's : Needs assistance/impaired   Eating/Feeding Details (indicate cue type and reason): Pt reports that today is the "first time I've eaten in a week."                                   General ADL Comments: Pt reluctant participant in session, saying any movement was painful     Vision Patient Visual Report: No change from baseline       Perception     Praxis      Pertinent Vitals/Pain Pain Assessment: 0-10 (describes as "worst pain ever," and that "it hurts to move") Pain Location: Right lower rib cage Pain Intervention(s): Limited activity within patient's tolerance;Repositioned;Patient requesting  pain meds-RN notified     Hand Dominance     Extremity/Trunk Assessment Upper Extremity Assessment Upper Extremity Assessment: Generalized weakness   Lower Extremity Assessment Lower Extremity Assessment: Generalized weakness       Communication Communication Communication: HOH;Other (comment) (slurred, garbled speech)   Cognition Arousal/Alertness: Lethargic Behavior During Therapy: Anxious;Agitated Overall Cognitive Status: Difficult to assess                                     General Comments       Exercises Other Exercises Other Exercises: Discussed POC, SA tx (pt not interested at present); post D/C living situation (pt unsure where she will go)   Shoulder Instructions      Home Living Family/patient expects to be discharged to:: Private residence Living Arrangements: Spouse/significant other Available Help at Discharge: Other (Comment) (unclear)                         Home Equipment: None   Additional Comments: Pt is a reluctant and inconsistent historian, and with slurred speech -- difficult to obtain PMHx, PLOF, and info re: current living situation      Prior Functioning/Environment Level of Independence: Needs assistance        Comments: Pt does not drive, is not employed. Difficult to address PLOF, given pt's reticence to participate in session        OT Problem List: Decreased strength;Decreased activity tolerance;Decreased coordination;Decreased safety awareness;Pain      OT Treatment/Interventions:      OT Goals(Current goals can be found in the care plan section) Acute Rehab OT Goals Patient Stated Goal: to get rid of pain in side OT Goal Formulation: With patient Time For Goal Achievement: 06/25/20 Potential to Achieve Goals: Good  OT Frequency:     Barriers to D/C:            Co-evaluation              AM-PAC OT "6 Clicks" Daily Activity     Outcome Measure Help from another person eating  meals?: None Help from another person taking care of personal grooming?: None Help from another person toileting, which includes using toliet, bedpan, or urinal?: None Help from another person bathing (including washing, rinsing, drying)?: None Help from another person to put on and taking off regular upper body clothing?: None Help from another person to put on and taking off regular lower body clothing?: None 6 Click Score: 24   End of Session Nurse Communication: Patient requests pain meds  Activity  Tolerance: Treatment limited secondary to agitation Patient left: in bed;with call bell/phone within reach  OT Visit Diagnosis: History of falling (Z91.81);Muscle weakness (generalized) (M62.81);Unsteadiness on feet (R26.81);Pain Pain - Right/Left: Right                Time: 1001-1016 OT Time Calculation (min): 15 min Charges:  OT General Charges $OT Visit: 1 Visit OT Evaluation $OT Eval Moderate Complexity: 1 Mod OT Treatments $Self Care/Home Management : 8-22 mins  Josiah Lobo, PhD, [place], OTR/L ascom [phone] 06/11/20, 10:55 AM

## 2020-06-11 NOTE — Evaluation (Signed)
Physical Therapy Evaluation Patient Details Name: Olivia Lester MRN: [id number] DOB: Aug 15, 1975 Today's Date: 06/11/2020   History of Present Illness  Olivia Lester is a 11yoF who comes to [facility] on 11/17 after seizure and fall at home. Pt apparently has been in withdrawl from ETOH for several days, reports 9 seizures in 3 days PTA, injured forehead.  PMH: polysubabuse, tobacco ause, HTN, GERD, panic atacks, pancreatits, HCV, gastritis, thrombocytopenia, GERD.  Clinical Impression  Pt admitted with above diagnosis. Pt currently with functional limitations due to the deficits listed below (see "PT Problem List"). Upon entry, pt in bed, awake, conditionally agreeable to participate, however her flank pain and generally malaise limits motivation. The pt is alert and oriented to self and situation, pleasant, but a limited historian, suspect combined HOH and mild confusion compounding this issue. Pt is agreeable to getting OOB to chair for meal, able to do so with encouragement and hand held assist. Pt declines AMB due to pain, nausea, and hunger. Pt reports no baseline difficulty with balance, AMB, no falls history, no baseline use of DME for mobility, safe for when she is in acute withdrawal. Functional mobility assessment demonstrates increased effort requirements, fair tolerance, but no franks need for physical assistance, whereas the patient performed these at a higher level of independence PTA. Recommend pain control and continue to mobilize with nursing once able. No skilled PT services warranted at this time. Will sign off. Thank you for this consult.     Follow Up Recommendations No PT follow up    Equipment Recommendations  None recommended by PT    Recommendations for Other Services       Precautions / Restrictions Precautions Precautions: Fall;Other (comment) Precaution Comments: seizures Restrictions Weight Bearing Restrictions: No      Mobility  Bed Mobility Overal bed mobility: Modified  Independent             General bed mobility comments: has pain in Right lateral thorax that inhibits movement    Transfers Overall transfer level: Needs assistance Equipment used: 1 person hand held assist Transfers: Sit to/from Stand;Stand Pivot Transfers Sit to Stand: Supervision Stand pivot transfers: Supervision          Ambulation/Gait Ambulation/Gait assistance:  (pt declines, has pain, desires to eat)              Stairs            Wheelchair Mobility    Modified Rankin (Stroke Patients Only)       Balance Overall balance assessment: No apparent balance deficits (not formally assessed)                                           Pertinent Vitals/Pain Pain Assessment: 0-10 (describes as "worst pain ever," and that "it hurts to move") Pain Location: Right lower rib cage Pain Intervention(s): Limited activity within patient's tolerance;Repositioned;Patient requesting pain meds-RN notified    Home Living Family/patient expects to be discharged to:: Private residence Living Arrangements: Spouse/significant other Available Help at Discharge: Other (Comment) (unclear)           Home Equipment: None Additional Comments: Pt is a reluctant and inconsistent historian, and with slurred speech -- difficult to obtain PMHx, PLOF, and info re: current living situation    Prior Function Level of Independence: Needs assistance         Comments: Pt does not  drive, is not employed. Difficult to address PLOF, given pt's reticence to participate in session     Hand Dominance        Extremity/Trunk Assessment   Upper Extremity Assessment Upper Extremity Assessment: Generalized weakness    Lower Extremity Assessment Lower Extremity Assessment: Generalized weakness       Communication   Communication: HOH;Other (comment) (slurred, garbled speech)  Cognition Arousal/Alertness: Lethargic Behavior During Therapy:  Anxious;Agitated Overall Cognitive Status: Difficult to assess                                        General Comments      Exercises Other Exercises Other Exercises: Discussed POC, SA tx (pt not interested at present); post D/C living situation (pt unsure where she will go)   Assessment/Plan    PT Assessment Patent does not need any further PT services  PT Problem List Decreased mobility;Decreased activity tolerance       PT Treatment Interventions      PT Goals (Current goals can be found in the Care Plan section)  Acute Rehab PT Goals Patient Stated Goal: to get rid of pain in side PT Goal Formulation: All assessment and education complete, DC therapy    Frequency     Barriers to discharge        Co-evaluation               AM-PAC PT "6 Clicks" Mobility  Outcome Measure Help needed turning from your back to your side while in a flat bed without using bedrails?: None Help needed moving from lying on your back to sitting on the side of a flat bed without using bedrails?: None Help needed moving to and from a bed to a chair (including a wheelchair)?: A Little Help needed standing up from a chair using your arms (e.g., wheelchair or bedside chair)?: A Little Help needed to walk in hospital room?: A Little Help needed climbing 3-5 steps with a railing? : A Little 6 Click Score: 20    End of Session   Activity Tolerance: Patient limited by pain;Patient tolerated treatment well Patient left: in chair;with call bell/phone within reach Nurse Communication: Mobility status PT Visit Diagnosis: Difficulty in walking, not elsewhere classified (R26.2)    Time: 4259-5638 PT Time Calculation (min) (ACUTE ONLY): 10 min   Charges:   PT Evaluation $PT Eval Low Complexity: 1 Low          1:26 PM, 06/11/20 Etta Grandchild, PT, DPT Physical Therapist - [facility]  [phone] ([place])    Olivia Lester  C 06/11/2020, 1:21 PM

## 2020-06-11 NOTE — Progress Notes (Signed)
Pt rested throughout the shift with no seizure like activity noted. Pt was able to endorse right rib pain during the beginning of the shift that was relieved PRN pain medications. CIWA most previous assessment is 5 as patient responds well to scheduled ativan administration. Pt was cooperative with staff during the shift and did not present with any excessive agitation, tremors, or headaches. Pt bed remains in a low position with the call light within reach and side rails are padded. Fall mats are in place. No obvious distress is noted at this time.

## 2020-06-11 NOTE — TOC Progression Note (Signed)
Transition of Care [facility]) - Progression Note    Patient Details  Name: Olivia Lester MRN: [id number] Date of Birth: 1976-05-28  Transition of Care [facility]) CM/SW Contact  Shelbie Hutching, RN Phone Number: 06/11/2020, 1:44 PM  Clinical Narrative:    RNCM attempted to see patient at the bedside.  Patient was asleep and did not arouse to repeated verbal stimuli.  RNCM will see patient before discharge to present resources for substance abuse.         Expected Discharge Plan and Services                                                 Social Determinants of Health (SDOH) Interventions    Readmission Risk Interventions No flowsheet data found.

## 2020-06-11 NOTE — Progress Notes (Signed)
[place] [facility] [place] NAME: Arlie Posch    MR#:  [id number]  DATE OF BIRTH:  18-Jul-1976  SUBJECTIVE:   Patient came in with possible seizures at home. She said she stopped drinking three days ago. She tells me she has shaking spell however she is awake during the episode. Had a fall. Complains of rib pain. I placed her food tray in front of her and she started eating saying this is the first time in a week she is getting something to eat. No family in the room. REVIEW OF SYSTEMS:   Review of Systems  Constitutional: Negative for chills, fever and weight loss.  HENT: Negative for ear discharge, ear pain and nosebleeds.   Eyes: Negative for blurred vision, pain and discharge.  Respiratory: Negative for sputum production, shortness of breath, wheezing and stridor.        Rib pain  Cardiovascular: Negative for chest pain, palpitations, orthopnea and PND.  Gastrointestinal: Negative for abdominal pain, diarrhea, nausea and vomiting.  Genitourinary: Negative for frequency and urgency.  Musculoskeletal: Negative for back pain and joint pain.  Neurological: Negative for sensory change, speech change, focal weakness and weakness.  Psychiatric/Behavioral: Negative for depression and hallucinations. The patient is not nervous/anxious.    Tolerating Diet:yes Tolerating PT: no PT needs for home  DRUG ALLERGIES:   Allergies  Allergen Reactions  . Toradol [Ketorolac Tromethamine] Other (See Comments)    Patient advised not to take due to ulcers  . Asa [Aspirin] Rash    VITALS:  Blood pressure 102/69, pulse 82, temperature 98.5 F (36.9 C), temperature source Oral, resp. rate 16, height 5\' 7"  (1.702 m), weight 45.4 kg, SpO2 100 %.  PHYSICAL EXAMINATION:   Physical Exam  GENERAL:  44 y.o.-year-old patient lying in the bed with no acute distress. Thin frail HEENT: Head atraumatic, normocephalic. Oropharynx and nasopharynx clear. Right forehead sup  laceration--bandage+ LUNGS: Normal breath sounds bilaterally, no wheezing, rales, rhonchi. No use of accessory muscles of respiration.  CARDIOVASCULAR: S1, S2 normal. No murmurs, rubs, or gallops.  ABDOMEN: Soft, nontender, nondistended. Bowel sounds present. No organomegaly or mass.  EXTREMITIES: No cyanosis, clubbing or edema b/l.    NEUROLOGIC: Cranial nerves II through XII are intact. No focal Motor or sensory deficits b/l.   PSYCHIATRIC:  patient is alert and oriented x 2.  SKIN: No obvious rash, lesion, or ulcer.   LABORATORY PANEL:  CBC Recent Labs  Lab 06/11/20 0518  WBC 6.8  HGB 13.5  HCT 37.7  PLT 66*    Chemistries  Recent Labs  Lab 06/10/20 0802 06/10/20 1307 06/11/20 0518  NA 125*   < > 130*  K 3.1*   < > 3.1*  CL 77*   < > 88*  CO2 25   < > 28  GLUCOSE 83   < > 87  BUN 20   < > 23*  CREATININE 1.57*   < > 0.78  CALCIUM 9.7   < > 8.8*  MG 1.3*  --   --   AST 325*  --  184*  ALT 105*  --  60*  ALKPHOS 95  --  64  BILITOT 1.9*  --  1.6*   < > = values in this interval not displayed.   Cardiac Enzymes No results for input(s): TROPONINI in the last 168 hours. RADIOLOGY:  DG Chest 2 View  Result Date: 06/10/2020 CLINICAL DATA:  Pain following fall EXAM: CHEST - 2  VIEW COMPARISON:  April 30, 2020. FINDINGS: There is minimal scarring in the left base. The lungs elsewhere are clear. The heart size and pulmonary vascularity are normal. No adenopathy. No bone lesions. No pneumothorax. IMPRESSION: Mild scarring left base.  Lungs elsewhere clear.  Heart size normal. Electronically Signed   By: Lowella Grip III M.D.   On: 06/10/2020 13:08   CT Head Wo Contrast  Result Date: 06/10/2020 CLINICAL DATA:  Seizures.  Multiple falls. EXAM: CT HEAD WITHOUT CONTRAST TECHNIQUE: Contiguous axial images were obtained from the base of the skull through the vertex without intravenous contrast. COMPARISON:  03/12/2020 FINDINGS: Brain: No evidence of acute infarction,  hemorrhage, hydrocephalus, extra-axial collection or mass lesion/mass effect. Vascular: No hyperdense vessel or unexpected calcification. Skull: Normal. Negative for fracture or focal lesion. Sinuses/Orbits: Minimal mucosal disease in the visualized right maxillary sinus. Minimal disease involving the left frontal ethmoidal region. Other: Soft tissue prominence along the right frontal bone is suggestive for a small scalp hematoma. No underlying fracture. IMPRESSION: 1. No acute intracranial abnormality. 2. Small scalp hematoma along the right frontal bone. No underlying fracture. Electronically Signed   By: Markus Daft M.D.   On: 06/10/2020 08:55   ASSESSMENT AND PLAN:  Haset [place] is a 44 y.o. female with medical history significant of alcohol withdrawal seizure, polysubstance abuse, tobacco abuse, hypertension, GERD, panic attack, pancreatitis, HCV, gastritis, thrombocytopenia, GERD, who presents with seizure and fall. Patient states that she stopped drinking 3 days ago.  She has had 9 seizures in the past 3 days.  Seizure: likely due to alcohol withdrawal seizure.   -Patient stopped drinking alcohol 3 days ago.  Alcohol level less than 10.  -CT-head negative for acute intracranial abnormalities.  No focal deficit on physical examination. -Seizure precaution -Librium 10 mg tid -When necessary Ativan for seizure--cont CIWA protocol--scoring low today -no seizures reported  Fall: likely due to alcohol abuse.   -CT head negative for acute intracranial abnormalities. -Fall precaution -PT/OT--no PT/OT rec for home--pt did well -Wound care  for right frontal head skin laceration--no active bleeding - CXR due to right lower rib cage pain--no rib #  Thrombocytopenia ([place]): This is chronic issue, likely due to alcohol abuse  peripheral smear per pathologist review consistent with chronic alcohol use  Polysubstance abuse,  Tobacco dependence, alcohol withdrawal: -UDS positive for cannabinoid,  barbiturates and amphetamine -Nicotine patch -on CiWA protocol  Essential hypertension: -IV hydralazine as needed -continue home amlodipine  Abnormal LFTs suspected alcoholic hepatitis with history of hepatitis C -ALP 95, AST 325, ALT 105, total bilirubin 1.9.-- Trending down -Avoid using Tylenol  AKI (acute kidney injury) ([place]):  -IVF: 1L of NS and then 75 cc/h-- resolved. Patient tolerating PO diet. Will discontinue IV fluids.  Hyponatremia: Sodium 125.  Likely due to alcohol abuse, decreased oral intake, and Nausea, vomiting, diarrhea sodium improving after IV fluids.  Hypokalemia and hypomagnesemia  - Replete with pharmacy protocol  Patient advised to abstain from drinking alcohol, using street drugs and smoking.   DVT ppx: SCD Code Status: Full code Family Communication: left message for mother Moss Mc Disposition Plan:  Anticipate discharge back to previous environment Consults called:  None Admission status: Med-surg bed inpateint    Dispo: The patient is from: Home  Anticipated d/c is to: Home  Anticipated d/c date is likely tomorrow  Patient currently is not medically stable to d/c. Continue with alcohol withdrawal protocol, replace electrolytes. TOC for discharge planning.  TOTAL TIME TAKING CARE OF THIS PATIENT: *25* minutes.  >50% time spent on counselling and coordination of care  Note: This dictation was prepared with Dragon dictation along with smaller phrase technology. Any transcriptional errors that result from this process are unintentional.  Fritzi Mandes M.D    [facility]   CC: Primary care physician; Patient, No Pcp PerPatient ID: [name], female   DOB: [birth year], 44 y.o.   MRN: [id number]

## 2020-06-12 LAB — BASIC METABOLIC PANEL
Anion gap: 10 (ref 5–15)
BUN: 17 mg/dL (ref 6–20)
CO2: 26 mmol/L (ref 22–32)
Calcium: 9.3 mg/dL (ref 8.9–10.3)
Chloride: 97 mmol/L — ABNORMAL LOW (ref 98–111)
Creatinine, Ser: 0.54 mg/dL (ref 0.44–1.00)
GFR, Estimated: >60 mL/min (ref >60)
Glucose, Bld: 87 mg/dL (ref 70–99)
Potassium: 3.5 mmol/L (ref 3.5–5.1)
Sodium: 133 mmol/L — ABNORMAL LOW (ref 135–145)

## 2020-06-12 MED ORDER — POTASSIUM CHLORIDE CRYS ER 20 MEQ PO TBCR
40.0000 meq | EXTENDED_RELEASE_TABLET | Freq: Once | ORAL | Status: AC
  Administered 2020-06-12: 08:00:00 40 meq via ORAL
  Filled 2020-06-12: qty 2

## 2020-06-12 NOTE — Discharge Summary (Signed)
Olivia Lester at [facility] NAME: Olivia Lester    MR#:  [id number]  DATE OF BIRTH:  01-12-1976  DATE OF ADMISSION:  06/10/2020 ADMITTING PHYSICIAN: Fritzi Mandes, MD  DATE OF DISCHARGE: 06/12/2020  PRIMARY CARE PHYSICIAN: Patient, No Pcp Per    ADMISSION DIAGNOSIS:  Seizure ([place]) [R56.9] Multiple falls [R29.6] Laceration of forehead, initial encounter [S01.81XA] Perceptual disturbances and seizures concurrent with and due to alcohol withdrawal ([place]) [G38.756, R56.9]  DISCHARGE DIAGNOSIS:  chronic alcoholism electrolyte abnormality-- improved suspected alcohol withdrawal seizure polysubstance abuse  SECONDARY DIAGNOSIS:   Past Medical History:  Diagnosis Date  . Alcohol abuse   . Back injury   . Gastric ulcer   . Gastric ulcer 10/14/2013  . Hepatitis C 10/14/2013  . History of kidney stones   . 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 Pancreatitis, acute   . Panic attack   . Pneumonia   . Polysubstance abuse ([place])   . PONV (postoperative nausea and vomiting)   . Seizures [facility])     HOSPITAL COURSE:  Olivia Lester a 44 y.o.femalewith medical history significant ofalcohol withdrawal seizure, polysubstance abuse, tobacco abuse, hypertension, GERD, panic attack, pancreatitis, HCV, gastritis, thrombocytopenia, GERD, who presents with seizureand fall. Patient states that she stopped drinking 3 days ago. She has had 9 seizures in the past 3 days.  Suspected seizure: likely due toalcohol withdrawal seizure.  -Patient stopped drinking alcohol 3 days ago. Alcohol level less than 10.  -CT-headnegative for acute intracranial abnormalities. No focal deficit on physical examination. -Seizure precaution -Librium 10 mg tid -When necessary Ativan for seizure--cont CIWA protocol--scoring low today -no seizures reported in the hospital. -Patient reports she has shaking spell although she is awake during these episodes ?  Seizure  Fall: likely due toalcohol abuse.  -CT head negative for acute intracranial abnormalities. -Fall precaution -PT/OT--no PT/OT rec for home--pt did well -Wound care  for right frontal head skin laceration--no active bleeding - CXR due to right lower rib cage pain--no rib #  Thrombocytopenia ([place]):This is chronic issue, likely due to alcohol abuse  peripheral smear per pathologist review consistent with chronic alcohol use  Polysubstance abuse,Tobacco dependence, alcohol withdrawal: -UDS positive for cannabinoid, barbiturates and amphetamine -Nicotine patch -was on CiWA protocol  Essential hypertension: -IV hydralazine as needed -continue homeamlodipine  Abnormal LFTs suspected alcoholic hepatitis with history of hepatitis C -ALP 95, AST 325, ALT 105, total bilirubin 1.9.-- Trending down -Avoid using Tylenol  AKI (acute kidney injury) ([place]):  -IVF: 1L of NS and then 75 cc/h-- resolved. Patient tolerating PO diet. Will discontinue IV fluids.  Hyponatremia:Sodium 125. Likely due to alcohol abuse,decreased oral intake, andNausea, vomiting, diarrhea sodium improving after IV fluids.  Hypokalemiaand hypomagnesemia - Replete with pharmacy protocol  Patient advised to abstain from drinking alcohol, using street drugs and smoking.   DVT ppx: SCD Code Status:Full code Family Communication: left message for mother Moss Mc Disposition Plan: Anticipate discharge back to previous environment Consults called:None Admission status: Med-surg bed inpateint    Dispo: The patient is from:Home Anticipated d/c is EP:PIRJ Anticipated d/c date is  today Patient currently is medically stable to d/c.    CONSULTS OBTAINED:    DRUG ALLERGIES:   Allergies  Allergen Reactions  . Toradol [Ketorolac Tromethamine] Other (See Comments)    Patient advised not to take due to ulcers  . Asa [Aspirin] Rash     DISCHARGE MEDICATIONS:   Allergies as of 06/12/2020  Reactions   Toradol [ketorolac Tromethamine] Other (See Comments)   Patient advised not to take due to ulcers   Asa [aspirin] Rash      Medication List    STOP taking these medications   nicotine 21 mg/24hr patch Commonly known as: NICODERM CQ - dosed in mg/24 hours   permethrin 5 % cream Commonly known as: ELIMITE     TAKE these medications   acetaminophen 325 MG tablet Commonly known as: TYLENOL Take 2 tablets (650 mg total) by mouth every 6 (six) hours as needed for mild pain or headache (or Fever >/= 101).   amLODipine 5 MG tablet Commonly known as: NORVASC Take 1 tablet (5 mg total) by mouth daily.   folic acid 1 MG tablet Commonly known as: FOLVITE Take 1 tablet (1 mg total) by mouth daily.   multivitamin with minerals Tabs tablet Take 1 tablet by mouth every other day.   Pancrelipase (Lip-Prot-Amyl) 24000-76000 units Cpep Take 1 capsule (24,000 Units total) by mouth 3 (three) times daily before meals.   pantoprazole 40 MG tablet Commonly known as: Protonix Take 1 tablet (40 mg total) by mouth daily.   potassium chloride 10 MEQ tablet Commonly known as: KLOR-CON Take 1 tablet (10 mEq total) by mouth 2 (two) times daily.   promethazine 25 MG tablet Commonly known as: PHENERGAN Take 1 tablet (25 mg total) by mouth every 6 (six) hours as needed for nausea or vomiting.   thiamine 100 MG tablet Take 1 tablet (100 mg total) by mouth daily.       If you experience worsening of your admission symptoms, develop shortness of breath, life threatening emergency, suicidal or homicidal thoughts you must seek medical attention immediately by calling 911 or calling your MD immediately  if symptoms less severe.  You Must read complete instructions/literature along with all the possible adverse reactions/side effects for all the Medicines you take and that have been prescribed to you. Take any new Medicines  after you have completely understood and accept all the possible adverse reactions/side effects.   Please note  You were cared for by a hospitalist during your hospital stay. If you have any questions about your discharge medications or the care you received while you were in the hospital after you are discharged, you can call the unit and asked to speak with the hospitalist on call if the hospitalist that took care of you is not available. Once you are discharged, your primary care physician will handle any further medical issues. Please note that NO REFILLS for any discharge medications will be authorized once you are discharged, as it is imperative that you return to your primary care physician (or establish a relationship with a primary care physician if you do not have one) for your aftercare needs so that they can reassess your need for medications and monitor your lab values. Today   SUBJECTIVE   No nausea vomiting. Tolerating PO diet. Right-sided rib pain. No respiratory distress  VITAL SIGNS:  Blood pressure (!) 140/98, pulse 76, temperature 98.3 F (36.8 C), temperature source Oral, resp. rate 18, height 5\' 7"  (1.702 m), weight 45.4 kg, SpO2 100 %.  I/O:    Intake/Output Summary (Last 24 hours) at 06/12/2020 0817 Last data filed at 06/11/2020 1500 Gross per 24 hour  Intake 2017.28 ml  Output --  Net 2017.28 ml    PHYSICAL EXAMINATION:  GENERAL:  44 y.o.-year-old patient lying in the bed with no acute distress. Thin frail right forehead  small laceration no bleeding. Bandage present LUNGS: Normal breath sounds bilaterally, no wheezing, rales,rhonchi or crepitation. No use of accessory muscles of respiration.  CARDIOVASCULAR: S1, S2 normal. No murmurs, rubs, or gallops.  ABDOMEN: Soft, non-tender, non-distended. Bowel sounds present. No organomegaly or mass.  EXTREMITIES: No pedal edema, cyanosis, or clubbing.  NEUROLOGIC: Cranial nerves II through XII are intact. Muscle  strength 5/5 in all extremities. Sensation intact. Gait not checked. No tremors PSYCHIATRIC: The patient is alert and oriented x 3.  SKIN: No obvious rash, lesion, or ulcer.   DATA REVIEW:   CBC  Recent Labs  Lab 06/11/20 0518  WBC 6.8  HGB 13.5  HCT 37.7  PLT 66*    Chemistries  Recent Labs  Lab 06/10/20 0802 06/10/20 1307 06/11/20 0518  NA 125*   < > 130*  K 3.1*   < > 3.1*  CL 77*   < > 88*  CO2 25   < > 28  GLUCOSE 83   < > 87  BUN 20   < > 23*  CREATININE 1.57*   < > 0.78  CALCIUM 9.7   < > 8.8*  MG 1.3*  --   --   AST 325*  --  184*  ALT 105*  --  60*  ALKPHOS 95  --  64  BILITOT 1.9*  --  1.6*   < > = values in this interval not displayed.    Microbiology Results   Recent Results (from the past 240 hour(s))  Respiratory Panel by RT PCR (Flu A&B, Covid) - Nasopharyngeal Swab     Status: None   Collection Time: 06/03/20 10:54 AM   Specimen: Nasopharyngeal Swab  Result Value Ref Range Status   SARS Coronavirus 2 by RT PCR NEGATIVE NEGATIVE Final    Comment: (NOTE) SARS-CoV-2 target nucleic acids are NOT DETECTED.  The SARS-CoV-2 RNA is generally detectable in upper respiratoy specimens during the acute phase of infection. The lowest concentration of SARS-CoV-2 viral copies this assay can detect is 131 copies/mL. A negative result does not preclude SARS-Cov-2 infection and should not be used as the sole basis for treatment or other patient management decisions. A negative result may occur with  improper specimen collection/handling, submission of specimen other than nasopharyngeal swab, presence of viral mutation(s) within the areas targeted by this assay, and inadequate number of viral copies (<131 copies/mL). A negative result must be combined with clinical observations, patient history, and epidemiological information. The expected result is Negative.  Fact Sheet for Patients:  PinkCheek.be  Fact Sheet for Healthcare  Providers:  GravelBags.it  This test is no t yet approved or cleared by the [place] FDA and  has been authorized for detection and/or diagnosis of SARS-CoV-2 by FDA under an Emergency Use Authorization (EUA). This EUA will remain  in effect (meaning this test can be used) for the duration of the COVID-19 declaration under Section 564(b)(1) of the Act, 21 U.S.C. section 360bbb-3(b)(1), unless the authorization is terminated or revoked sooner.     Influenza A by PCR NEGATIVE NEGATIVE Final   Influenza B by PCR NEGATIVE NEGATIVE Final    Comment: (NOTE) The Xpert Xpress SARS-CoV-2/FLU/RSV assay is intended as an aid in  the diagnosis of influenza from Nasopharyngeal swab specimens and  should not be used as a sole basis for treatment. Nasal washings and  aspirates are unacceptable for Xpert Xpress SARS-CoV-2/FLU/RSV  testing.  Fact Sheet for Patients: PinkCheek.be  Fact Sheet for Healthcare Providers: GravelBags.it  This test is not yet approved or cleared by the [place] and  has been authorized for detection and/or diagnosis of SARS-CoV-2 by  FDA under an Emergency Use Authorization (EUA). This EUA will remain  in effect (meaning this test can be used) for the duration of the  Covid-19 declaration under Section 564(b)(1) of the Act, 21  U.S.C. section 360bbb-3(b)(1), unless the authorization is  terminated or revoked. Performed at [facility], [address] [place], [place] [address]   Respiratory Panel by RT PCR (Flu A&B, Covid) - Nasopharyngeal Swab     Status: None   Collection Time: 06/10/20  9:36 AM   Specimen: Nasopharyngeal Swab  Result Value Ref Range Status   SARS Coronavirus 2 by RT PCR NEGATIVE NEGATIVE Final    Comment: (NOTE) SARS-CoV-2 target nucleic acids are NOT DETECTED.  The SARS-CoV-2 RNA is generally detectable in upper respiratoy specimens  during the acute phase of infection. The lowest concentration of SARS-CoV-2 viral copies this assay can detect is 131 copies/mL. A negative result does not preclude SARS-Cov-2 infection and should not be used as the sole basis for treatment or other patient management decisions. A negative result may occur with  improper specimen collection/handling, submission of specimen other than nasopharyngeal swab, presence of viral mutation(s) within the areas targeted by this assay, and inadequate number of viral copies (<131 copies/mL). A negative result must be combined with clinical observations, patient history, and epidemiological information. The expected result is Negative.  Fact Sheet for Patients:  PinkCheek.be  Fact Sheet for Healthcare Providers:  GravelBags.it  This test is no t yet approved or cleared by the [place] FDA and  has been authorized for detection and/or diagnosis of SARS-CoV-2 by FDA under an Emergency Use Authorization (EUA). This EUA will remain  in effect (meaning this test can be used) for the duration of the COVID-19 declaration under Section 564(b)(1) of the Act, 21 U.S.C. section 360bbb-3(b)(1), unless the authorization is terminated or revoked sooner.     Influenza A by PCR NEGATIVE NEGATIVE Final   Influenza B by PCR NEGATIVE NEGATIVE Final    Comment: (NOTE) The Xpert Xpress SARS-CoV-2/FLU/RSV assay is intended as an aid in  the diagnosis of influenza from Nasopharyngeal swab specimens and  should not be used as a sole basis for treatment. Nasal washings and  aspirates are unacceptable for Xpert Xpress SARS-CoV-2/FLU/RSV  testing.  Fact Sheet for Patients: PinkCheek.be  Fact Sheet for Healthcare Providers: GravelBags.it  This test is not yet approved or cleared by the [place] FDA and  has been authorized for detection and/or  diagnosis of SARS-CoV-2 by  FDA under an Emergency Use Authorization (EUA). This EUA will remain  in effect (meaning this test can be used) for the duration of the  Covid-19 declaration under Section 564(b)(1) of the Act, 21  U.S.C. section 360bbb-3(b)(1), unless the authorization is  terminated or revoked. Performed at [facility], [address] [place], [place] [address]     RADIOLOGY:  DG Chest 2 View  Result Date: 06/10/2020 CLINICAL DATA:  Pain following fall EXAM: CHEST - 2 VIEW COMPARISON:  April 30, 2020. FINDINGS: There is minimal scarring in the left base. The lungs elsewhere are clear. The heart size and pulmonary vascularity are normal. No adenopathy. No bone lesions. No pneumothorax. IMPRESSION: Mild scarring left base.  Lungs elsewhere clear.  Heart size normal. Electronically Signed   By: Lowella Grip III M.D.   On: 06/10/2020  13:08   CT Head Wo Contrast  Result Date: 06/10/2020 CLINICAL DATA:  Seizures.  Multiple falls. EXAM: CT HEAD WITHOUT CONTRAST TECHNIQUE: Contiguous axial images were obtained from the base of the skull through the vertex without intravenous contrast. COMPARISON:  03/12/2020 FINDINGS: Brain: No evidence of acute infarction, hemorrhage, hydrocephalus, extra-axial collection or mass lesion/mass effect. Vascular: No hyperdense vessel or unexpected calcification. Skull: Normal. Negative for fracture or focal lesion. Sinuses/Orbits: Minimal mucosal disease in the visualized right maxillary sinus. Minimal disease involving the left frontal ethmoidal region. Other: Soft tissue prominence along the right frontal bone is suggestive for a small scalp hematoma. No underlying fracture. IMPRESSION: 1. No acute intracranial abnormality. 2. Small scalp hematoma along the right frontal bone. No underlying fracture. Electronically Signed   By: Markus Daft M.D.   On: 06/10/2020 08:55     CODE STATUS:     Code Status Orders  (From admission, onward)          Start     Ordered   06/10/20 1139  Full code  Continuous        06/10/20 1138        Code Status History    Date Active Date Inactive Code Status Order ID Comments User Context   04/18/2020 0124 04/23/2020 2219 Full Code [id number]  Rolla Plate, DO ED   01/26/2020 1535 01/29/2020 1556 Full Code [id number]  Neena Rhymes, MD ED   01/07/2020 1815 01/08/2020 1828 Full Code [id number]  Roxan Hockey, MD Inpatient   09/05/2019 0500 09/07/2019 2134 Full Code [id number]  Bethena Roys, MD Inpatient   09/04/2019 1229 09/05/2019 0500 Full Code [id number]  Tacy Learn, PA-C ED   06/24/2019 1248 06/25/2019 2338 Full Code [id number]  Murlean Iba, MD Inpatient   03/31/2019 1620 04/04/2019 1555 Full Code [id number]  Mckinley Jewel, MD ED   03/31/2019 1237 03/31/2019 1620 Full Code [id number]  Tacy Learn, PA-C ED   03/10/2019 1707 03/12/2019 1344 Full Code [id number]  Bethena Roys, MD Inpatient   12/25/2017 2324 12/26/2017 1611 Full Code [id number]  Oswald Hillock, MD Inpatient   10/12/2013 2034 10/15/2013 2023 Full Code [id number]  Doree Albee, MD Inpatient   Advance Care Planning Activity       TOTAL TIME TAKING CARE OF THIS PATIENT: **35* minutes.    Fritzi Mandes M.D  [facility]    CC: Primary care physician; Patient, No Pcp Per

## 2020-06-12 NOTE — TOC Initial Note (Signed)
Transition of Care [facility]) - Initial/Assessment Note    Patient Details  Name: Olivia Lester Date MRN: [id number] Date of Birth: 16-May-1976  [facility] CM/SW Contact:    Shelbie Hutching, RN Phone Number: 06/12/2020, 12:03 PM  Clinical Narrative:                 Patient admitted to the hospital with alcohol withdrawal and seizures.  Patient has been admitted on multiple occasions for the same.  Patient is disengaged during meeting with this RNCM, RNCM consulted for substance abuse resources.  Patient did not see interested in resources but resources left at the bedside for her.  Patient lives with her boyfriend in [place].  She reports that someone can pick her up this afternoon.  TOC signed off.   Expected Discharge Plan: Home/Self Care Barriers to Discharge: Barriers Resolved   Patient Goals and CMS Choice Patient states their goals for this hospitalization and ongoing recovery are:: does not voice goals      Expected Discharge Plan and Services Expected Discharge Plan: Home/Self Care   Discharge Planning Services: CM Consult   Living arrangements for the past 2 months: Single Family Home Expected Discharge Date: 06/12/20                 DME Agency: NA       HH Arranged: NA          Prior Living Arrangements/Services Living arrangements for the past 2 months: Single Family Home Lives with:: Significant Other Patient language and need for interpreter reviewed:: Yes Do you feel safe going back to the place where you live?: Yes      Need for Family Participation in Patient Care: Yes (Comment) Care giver support system in place?: No (comment)   Criminal Activity/Legal Involvement Pertinent to Current Situation/Hospitalization: No - Comment as needed  Activities of Daily Living Home Assistive Devices/Equipment: None ADL Screening (condition at time of admission) Patient's cognitive ability adequate to safely complete daily activities?: Yes Is the patient deaf  or have difficulty hearing?: Yes Does the patient have difficulty seeing, even when wearing glasses/contacts?: No Does the patient have difficulty concentrating, remembering, or making decisions?: No Patient able to express need for assistance with ADLs?: Yes Does the patient have difficulty dressing or bathing?: No Independently performs ADLs?: Yes (appropriate for developmental age) Does the patient have difficulty walking or climbing stairs?: No Weakness of Legs: None Weakness of Arms/Hands: None  Permission Sought/Granted   Permission granted to share information with : No              Emotional Assessment Appearance:: Appears older than stated age Attitude/Demeanor/Rapport: Avoidant, Self-Absorbed Affect (typically observed): Irritable Orientation: : Oriented to Self, Oriented to Place, Oriented to  Time, Oriented to Situation Alcohol / Substance Use: Alcohol Use, Illicit Drugs Psych Involvement: No (comment)  Admission diagnosis:  Seizure ([place]) [R56.9] Multiple falls [R29.6] Laceration of forehead, initial encounter [S01.81XA] Perceptual disturbances and seizures concurrent with and due to alcohol withdrawal ([place]) [W54.627, R56.9] Patient Active Problem List   Diagnosis Date Noted  . Seizure ([place]) 06/10/2020  . Abnormal LFTs 06/10/2020  . AKI (acute kidney injury) ([place]) 06/10/2020  . Fall 06/10/2020  . Hyponatremia 06/10/2020  . Laceration of forehead   . Sepsis due to Escherichia coli (E. coli) ([place]) 04/20/2020  . Acute pyelonephritis 04/18/2020  . Sepsis ([place]) 04/17/2020  . Fatty liver, alcoholic/Severe hepatic steatosis 01/29/2020  . Pancreatitis, recurrent 01/26/2020  . Colitis 10/28/2019  .  Polysubstance abuse ([place]) 10/28/2019  . Hypomagnesemia 10/28/2019  . Alcoholic hepatitis without ascites--Severe hepatic steatosis 10/28/2019  . Alcoholic gastritis 29/57/4734  . DTs (delirium tremens) ([place])   . Urinary tract infection without hematuria   . Alcohol  intoxication ([place]) 09/04/2019  . Alcohol withdrawal ([place]) 06/24/2019  . Sinus tachycardia 06/24/2019  . Essential hypertension 06/24/2019  . Hypokalemia 03/31/2019  . Cannabis abuse 03/11/2019  . Elevated transaminase level   . Alcohol abuse   . Closed displaced fracture of fifth metatarsal bone of right foot 08/02/2018  . Dental abscess 12/25/2017  . Gastric ulcer 10/14/2013  . Transaminitis 10/14/2013  . Tobacco dependence 10/14/2013  . Hepatitis C 10/14/2013  . Thrombocytopenia ([place]) 10/13/2013  . Duodenitis 10/12/2013  . Alcoholism ([place]) 10/12/2013  . Abdominal pain, epigastric 10/12/2013  . ANKLE PAIN 01/01/2008   PCP:  Patient, No Pcp Per Pharmacy:   [facility], [address] AT [address] [place] [address] Phone: [phone] Fax: [phone]  [facility], [place] - [place] [address] [place] [address] [place] [address] Phone: [phone] Fax: [phone]     Social Determinants of Health (SDOH) Interventions    Readmission Risk Interventions No flowsheet data found.

## 2020-06-12 NOTE — Progress Notes (Signed)
Discharge paperwork reviewed w/ patient. Verbalizes understanding of d/c plan. All questions answered. Pt sent home w/ taxi service.

## 2020-07-06 ENCOUNTER — Other Ambulatory Visit: Payer: Self-pay

## 2020-07-06 ENCOUNTER — Encounter (HOSPITAL_COMMUNITY): Payer: Self-pay | Admitting: Emergency Medicine

## 2020-07-06 DIAGNOSIS — Z886 Allergy status to analgesic agent status: Secondary | ICD-10-CM

## 2020-07-06 DIAGNOSIS — D6959 Other secondary thrombocytopenia: Secondary | ICD-10-CM | POA: Diagnosis present

## 2020-07-06 DIAGNOSIS — Z8249 Family history of ischemic heart disease and other diseases of the circulatory system: Secondary | ICD-10-CM

## 2020-07-06 DIAGNOSIS — Z811 Family history of alcohol abuse and dependence: Secondary | ICD-10-CM

## 2020-07-06 DIAGNOSIS — Z681 Body mass index (BMI) 19 or less, adult: Secondary | ICD-10-CM

## 2020-07-06 DIAGNOSIS — Z8711 Personal history of peptic ulcer disease: Secondary | ICD-10-CM

## 2020-07-06 DIAGNOSIS — Z823 Family history of stroke: Secondary | ICD-10-CM

## 2020-07-06 DIAGNOSIS — B1920 Unspecified viral hepatitis C without hepatic coma: Secondary | ICD-10-CM | POA: Diagnosis present

## 2020-07-06 DIAGNOSIS — K700 Alcoholic fatty liver: Secondary | ICD-10-CM | POA: Diagnosis present

## 2020-07-06 DIAGNOSIS — K921 Melena: Secondary | ICD-10-CM | POA: Diagnosis present

## 2020-07-06 DIAGNOSIS — F1310 Sedative, hypnotic or anxiolytic abuse, uncomplicated: Secondary | ICD-10-CM | POA: Diagnosis present

## 2020-07-06 DIAGNOSIS — Z8616 Personal history of COVID-19: Secondary | ICD-10-CM

## 2020-07-06 DIAGNOSIS — K838 Other specified diseases of biliary tract: Secondary | ICD-10-CM | POA: Diagnosis present

## 2020-07-06 DIAGNOSIS — E876 Hypokalemia: Secondary | ICD-10-CM | POA: Diagnosis present

## 2020-07-06 DIAGNOSIS — Z87442 Personal history of urinary calculi: Secondary | ICD-10-CM

## 2020-07-06 DIAGNOSIS — Z7901 Long term (current) use of anticoagulants: Secondary | ICD-10-CM

## 2020-07-06 DIAGNOSIS — K920 Hematemesis: Secondary | ICD-10-CM | POA: Diagnosis present

## 2020-07-06 DIAGNOSIS — I10 Essential (primary) hypertension: Secondary | ICD-10-CM | POA: Diagnosis present

## 2020-07-06 DIAGNOSIS — R197 Diarrhea, unspecified: Secondary | ICD-10-CM | POA: Diagnosis present

## 2020-07-06 DIAGNOSIS — Z809 Family history of malignant neoplasm, unspecified: Secondary | ICD-10-CM

## 2020-07-06 DIAGNOSIS — F1210 Cannabis abuse, uncomplicated: Secondary | ICD-10-CM | POA: Diagnosis present

## 2020-07-06 DIAGNOSIS — E871 Hypo-osmolality and hyponatremia: Secondary | ICD-10-CM | POA: Diagnosis present

## 2020-07-06 DIAGNOSIS — F1020 Alcohol dependence, uncomplicated: Secondary | ICD-10-CM | POA: Diagnosis present

## 2020-07-06 DIAGNOSIS — K852 Alcohol induced acute pancreatitis without necrosis or infection: Principal | ICD-10-CM | POA: Diagnosis present

## 2020-07-06 DIAGNOSIS — R64 Cachexia: Secondary | ICD-10-CM | POA: Diagnosis present

## 2020-07-06 DIAGNOSIS — F17210 Nicotine dependence, cigarettes, uncomplicated: Secondary | ICD-10-CM | POA: Diagnosis present

## 2020-07-06 DIAGNOSIS — K860 Alcohol-induced chronic pancreatitis: Secondary | ICD-10-CM | POA: Diagnosis present

## 2020-07-06 DIAGNOSIS — K7010 Alcoholic hepatitis without ascites: Secondary | ICD-10-CM | POA: Diagnosis present

## 2020-07-06 DIAGNOSIS — Z885 Allergy status to narcotic agent status: Secondary | ICD-10-CM

## 2020-07-06 DIAGNOSIS — Z79899 Other long term (current) drug therapy: Secondary | ICD-10-CM

## 2020-07-06 NOTE — ED Triage Notes (Signed)
Pt c/o abd pain with n/v since this am. Ems gave pt 4mg  of zofran enroute to hospital.

## 2020-07-07 ENCOUNTER — Inpatient Hospital Stay (HOSPITAL_COMMUNITY)
Admission: EM | Admit: 2020-07-07 | Discharge: 2020-07-11 | DRG: 439 | Disposition: A | Discharge status: Home / Self Care | Payer: Self-pay | Attending: Internal Medicine | Admitting: Internal Medicine

## 2020-07-07 DIAGNOSIS — F10930 Alcohol use, unspecified with withdrawal, uncomplicated: Secondary | ICD-10-CM

## 2020-07-07 DIAGNOSIS — F10239 Alcohol dependence with withdrawal, unspecified: Secondary | ICD-10-CM

## 2020-07-07 DIAGNOSIS — R112 Nausea with vomiting, unspecified: Secondary | ICD-10-CM

## 2020-07-07 DIAGNOSIS — R569 Unspecified convulsions: Secondary | ICD-10-CM

## 2020-07-07 DIAGNOSIS — D72829 Elevated white blood cell count, unspecified: Secondary | ICD-10-CM | POA: Diagnosis present

## 2020-07-07 DIAGNOSIS — D696 Thrombocytopenia, unspecified: Secondary | ICD-10-CM | POA: Diagnosis present

## 2020-07-07 DIAGNOSIS — K859 Acute pancreatitis without necrosis or infection, unspecified: Secondary | ICD-10-CM

## 2020-07-07 DIAGNOSIS — R109 Unspecified abdominal pain: Secondary | ICD-10-CM

## 2020-07-07 DIAGNOSIS — K700 Alcoholic fatty liver: Secondary | ICD-10-CM | POA: Diagnosis present

## 2020-07-07 DIAGNOSIS — F1010 Alcohol abuse, uncomplicated: Secondary | ICD-10-CM | POA: Diagnosis present

## 2020-07-07 DIAGNOSIS — K7010 Alcoholic hepatitis without ascites: Secondary | ICD-10-CM | POA: Diagnosis present

## 2020-07-07 DIAGNOSIS — E871 Hypo-osmolality and hyponatremia: Secondary | ICD-10-CM | POA: Diagnosis present

## 2020-07-07 DIAGNOSIS — F10939 Alcohol use, unspecified with withdrawal, unspecified: Secondary | ICD-10-CM

## 2020-07-07 DIAGNOSIS — R7401 Elevation of levels of liver transaminase levels: Secondary | ICD-10-CM | POA: Diagnosis present

## 2020-07-07 DIAGNOSIS — K861 Other chronic pancreatitis: Secondary | ICD-10-CM | POA: Diagnosis present

## 2020-07-07 DIAGNOSIS — F1910 Other psychoactive substance abuse, uncomplicated: Secondary | ICD-10-CM | POA: Diagnosis present

## 2020-07-07 DIAGNOSIS — IMO0002 Reserved for concepts with insufficient information to code with codable children: Secondary | ICD-10-CM | POA: Diagnosis present

## 2020-07-07 DIAGNOSIS — E876 Hypokalemia: Secondary | ICD-10-CM

## 2020-07-07 DIAGNOSIS — F17200 Nicotine dependence, unspecified, uncomplicated: Secondary | ICD-10-CM | POA: Diagnosis present

## 2020-07-07 DIAGNOSIS — K852 Alcohol induced acute pancreatitis without necrosis or infection: Secondary | ICD-10-CM

## 2020-07-07 DIAGNOSIS — I10 Essential (primary) hypertension: Secondary | ICD-10-CM | POA: Diagnosis present

## 2020-07-07 LAB — CBC
HCT: 47.6 % — ABNORMAL HIGH (ref 36.0–46.0)
HCT: 46.9 % — ABNORMAL HIGH (ref 36.0–46.0)
Hemoglobin: 16.7 g/dL — ABNORMAL HIGH (ref 12.0–15.0)
Hemoglobin: 15.6 g/dL — ABNORMAL HIGH (ref 12.0–15.0)
MCH: 34.2 pg — ABNORMAL HIGH (ref 26.0–34.0)
MCH: 33.6 pg (ref 26.0–34.0)
MCHC: 35.1 g/dL (ref 30.0–36.0)
MCHC: 33.3 g/dL (ref 30.0–36.0)
MCV: 97.5 fL (ref 80.0–100.0)
MCV: 101.1 fL — ABNORMAL HIGH (ref 80.0–100.0)
Platelets: 191 10*3/uL (ref 150–400)
Platelets: 122 10*3/uL — ABNORMAL LOW (ref 150–400)
RBC: 4.88 MIL/uL (ref 3.87–5.11)
RBC: 4.64 MIL/uL (ref 3.87–5.11)
RDW: 14.6 % (ref 11.5–15.5)
RDW: 14.6 % (ref 11.5–15.5)
WBC: 13.8 10*3/uL — ABNORMAL HIGH (ref 4.0–10.5)
WBC: 8.4 10*3/uL (ref 4.0–10.5)
nRBC: 0 % (ref 0.0–0.2)
nRBC: 0 % (ref 0.0–0.2)

## 2020-07-07 LAB — COMPREHENSIVE METABOLIC PANEL
ALT: 52 U/L — ABNORMAL HIGH (ref 0–44)
AST: 76 U/L — ABNORMAL HIGH (ref 15–41)
Albumin: 4.6 g/dL (ref 3.5–5.0)
Alkaline Phosphatase: 105 U/L (ref 38–126)
Anion gap: 14 (ref 5–15)
BUN: 9 mg/dL (ref 6–20)
CO2: 29 mmol/L (ref 22–32)
Calcium: 9.8 mg/dL (ref 8.9–10.3)
Chloride: 85 mmol/L — ABNORMAL LOW (ref 98–111)
Creatinine, Ser: 0.4 mg/dL — ABNORMAL LOW (ref 0.44–1.00)
GFR, Estimated: >60 mL/min (ref >60)
Glucose, Bld: 93 mg/dL (ref 70–99)
Potassium: 2.8 mmol/L — ABNORMAL LOW (ref 3.5–5.1)
Sodium: 128 mmol/L — ABNORMAL LOW (ref 135–145)
Total Bilirubin: 0.8 mg/dL (ref 0.3–1.2)
Total Protein: 8.9 g/dL — ABNORMAL HIGH (ref 6.5–8.1)

## 2020-07-07 LAB — LIPASE, BLOOD: Lipase: 106 U/L — ABNORMAL HIGH (ref 11–51)

## 2020-07-07 LAB — RAPID URINE DRUG SCREEN, HOSP PERFORMED
Amphetamines: NOT DETECTED
Barbiturates: POSITIVE — AB
Benzodiazepines: POSITIVE — AB
Cocaine: NOT DETECTED
Opiates: NOT DETECTED
Tetrahydrocannabinol: POSITIVE — AB

## 2020-07-07 LAB — CREATININE, SERUM
Creatinine, Ser: 0.39 mg/dL — ABNORMAL LOW (ref 0.44–1.00)
GFR, Estimated: >60 mL/min (ref >60)

## 2020-07-07 LAB — MAGNESIUM: Magnesium: 1.6 mg/dL — ABNORMAL LOW (ref 1.7–2.4)

## 2020-07-07 LAB — PREGNANCY, URINE: Preg Test, Ur: NEGATIVE

## 2020-07-07 LAB — ETHANOL: Alcohol, Ethyl (B): <10 mg/dL (ref <10)

## 2020-07-07 MED ORDER — FOLIC ACID 1 MG PO TABS
1.0000 mg | ORAL_TABLET | Freq: Every day | ORAL | Status: DC
  Administered 2020-07-07 – 2020-07-11 (×5): 1 mg via ORAL
  Filled 2020-07-07 (×5): qty 1

## 2020-07-07 MED ORDER — OXYCODONE HCL 5 MG PO TABS
5.0000 mg | ORAL_TABLET | ORAL | Status: DC | PRN
  Administered 2020-07-07 – 2020-07-11 (×16): 5 mg via ORAL
  Filled 2020-07-07 (×17): qty 1

## 2020-07-07 MED ORDER — ENOXAPARIN SODIUM 40 MG/0.4ML ~~LOC~~ SOLN
40.0000 mg | SUBCUTANEOUS | Status: DC
  Administered 2020-07-07 – 2020-07-10 (×4): 40 mg via SUBCUTANEOUS
  Filled 2020-07-07 (×4): qty 0.4

## 2020-07-07 MED ORDER — FENTANYL CITRATE (PF) 100 MCG/2ML IJ SOLN
50.0000 ug | Freq: Once | INTRAMUSCULAR | Status: AC
  Administered 2020-07-07: 06:00:00 50 ug via INTRAVENOUS
  Filled 2020-07-07: qty 2

## 2020-07-07 MED ORDER — MAGNESIUM SULFATE 2 GM/50ML IV SOLN
2.0000 g | Freq: Once | INTRAVENOUS | Status: AC
  Administered 2020-07-07: 10:00:00 2 g via INTRAVENOUS

## 2020-07-07 MED ORDER — LACTATED RINGERS IV SOLN: INTRAVENOUS | Status: DC

## 2020-07-07 MED ORDER — HYDROMORPHONE HCL 1 MG/ML IJ SOLN
0.5000 mg | INTRAMUSCULAR | Status: DC | PRN
  Administered 2020-07-07 – 2020-07-09 (×13): 0.5 mg via INTRAVENOUS
  Filled 2020-07-07 (×10): qty 0.5
  Filled 2020-07-07: qty 1
  Filled 2020-07-07 (×3): qty 0.5

## 2020-07-07 MED ORDER — LORAZEPAM 1 MG PO TABS
1.0000 mg | ORAL_TABLET | ORAL | Status: AC | PRN
  Administered 2020-07-07: 09:00:00 1 mg via ORAL
  Administered 2020-07-07: 14:00:00 2 mg via ORAL
  Filled 2020-07-07: qty 1
  Filled 2020-07-07: qty 2
  Filled 2020-07-07: qty 1

## 2020-07-07 MED ORDER — ADULT MULTIVITAMIN W/MINERALS CH
1.0000 | ORAL_TABLET | Freq: Every day | ORAL | Status: DC
  Administered 2020-07-07 – 2020-07-11 (×5): 1 via ORAL
  Filled 2020-07-07 (×5): qty 1

## 2020-07-07 MED ORDER — LORAZEPAM 2 MG/ML IJ SOLN
0.0000 mg | Freq: Two times a day (BID) | INTRAMUSCULAR | Status: AC
  Administered 2020-07-09: 20:00:00 1 mg via INTRAVENOUS
  Administered 2020-07-09 – 2020-07-10 (×3): 2 mg via INTRAVENOUS
  Filled 2020-07-07 (×6): qty 1

## 2020-07-07 MED ORDER — LORAZEPAM 2 MG/ML IJ SOLN
0.0000 mg | Freq: Four times a day (QID) | INTRAMUSCULAR | Status: DC
  Administered 2020-07-07: 06:00:00 3 mg via INTRAVENOUS
  Filled 2020-07-07: qty 2

## 2020-07-07 MED ORDER — ONDANSETRON HCL 4 MG/2ML IJ SOLN
4.0000 mg | Freq: Once | INTRAMUSCULAR | Status: AC
  Administered 2020-07-07: 06:00:00 4 mg via INTRAVENOUS
  Filled 2020-07-07: qty 2

## 2020-07-07 MED ORDER — ONDANSETRON HCL 4 MG/2ML IJ SOLN
4.0000 mg | Freq: Four times a day (QID) | INTRAMUSCULAR | Status: DC | PRN
  Administered 2020-07-07 – 2020-07-09 (×5): 4 mg via INTRAVENOUS
  Filled 2020-07-07 (×5): qty 2

## 2020-07-07 MED ORDER — CHLORDIAZEPOXIDE HCL 5 MG PO CAPS
10.0000 mg | ORAL_CAPSULE | Freq: Three times a day (TID) | ORAL | Status: DC
  Administered 2020-07-07 – 2020-07-11 (×13): 10 mg via ORAL
  Filled 2020-07-07 (×13): qty 2

## 2020-07-07 MED ORDER — THIAMINE HCL 100 MG/ML IJ SOLN: 100.0000 mg | Freq: Every day | INTRAMUSCULAR | Status: DC

## 2020-07-07 MED ORDER — PANTOPRAZOLE SODIUM 40 MG IV SOLR
40.0000 mg | INTRAVENOUS | Status: DC
  Administered 2020-07-07 – 2020-07-11 (×5): 40 mg via INTRAVENOUS
  Filled 2020-07-07 (×5): qty 40

## 2020-07-07 MED ORDER — LORAZEPAM 2 MG/ML IJ SOLN
1.0000 mg | INTRAMUSCULAR | Status: AC | PRN
  Administered 2020-07-08: 14:00:00 2 mg via INTRAVENOUS
  Administered 2020-07-09: 15:00:00 1 mg via INTRAVENOUS
  Filled 2020-07-07: qty 1

## 2020-07-07 MED ORDER — POTASSIUM CHLORIDE 10 MEQ/100ML IV SOLN
10.0000 meq | Freq: Once | INTRAVENOUS | Status: AC
  Administered 2020-07-07: 11:00:00 10 meq via INTRAVENOUS
  Filled 2020-07-07: qty 100

## 2020-07-07 MED ORDER — NICOTINE 21 MG/24HR TD PT24
21.0000 mg | MEDICATED_PATCH | Freq: Every day | TRANSDERMAL | Status: DC
  Administered 2020-07-07 – 2020-07-11 (×5): 21 mg via TRANSDERMAL
  Filled 2020-07-07 (×5): qty 1

## 2020-07-07 MED ORDER — POTASSIUM CHLORIDE 10 MEQ/100ML IV SOLN
10.0000 meq | INTRAVENOUS | Status: AC
  Administered 2020-07-07 (×3): 10 meq via INTRAVENOUS
  Filled 2020-07-07 (×3): qty 100

## 2020-07-07 MED ORDER — THIAMINE HCL 100 MG PO TABS
100.0000 mg | ORAL_TABLET | Freq: Every day | ORAL | Status: DC
  Administered 2020-07-07 – 2020-07-11 (×5): 100 mg via ORAL
  Filled 2020-07-07 (×5): qty 1

## 2020-07-07 MED ORDER — LORAZEPAM 2 MG/ML IJ SOLN: 0.0000 mg | Freq: Two times a day (BID) | INTRAMUSCULAR | Status: DC

## 2020-07-07 MED ORDER — LORAZEPAM 2 MG/ML IJ SOLN
0.0000 mg | Freq: Four times a day (QID) | INTRAMUSCULAR | Status: AC
  Administered 2020-07-08: 02:00:00 1 mg via INTRAVENOUS
  Administered 2020-07-08 – 2020-07-09 (×3): 2 mg via INTRAVENOUS
  Filled 2020-07-07 (×3): qty 1

## 2020-07-07 MED ORDER — METOPROLOL TARTRATE 5 MG/5ML IV SOLN
5.0000 mg | Freq: Four times a day (QID) | INTRAVENOUS | Status: DC | PRN
  Filled 2020-07-07: qty 5

## 2020-07-07 MED ORDER — ONDANSETRON HCL 4 MG PO TABS
4.0000 mg | ORAL_TABLET | Freq: Four times a day (QID) | ORAL | Status: DC | PRN
  Administered 2020-07-07 – 2020-07-10 (×2): 4 mg via ORAL
  Filled 2020-07-07 (×2): qty 1

## 2020-07-07 MED ORDER — MAGNESIUM SULFATE 2 GM/50ML IV SOLN
2.0000 g | Freq: Once | INTRAVENOUS | Status: DC
  Filled 2020-07-07: qty 50

## 2020-07-07 MED ORDER — SODIUM CHLORIDE 0.9 % IV BOLUS
1000.0000 mL | Freq: Once | INTRAVENOUS | Status: AC
  Administered 2020-07-07: 06:00:00 1000 mL via INTRAVENOUS

## 2020-07-07 NOTE — TOC Initial Note (Signed)
Transition of Care [facility]) - Initial/Assessment Note    Patient Details  Name: Olivia Lester MRN: [id number] Date of Birth: 1976-07-05  Transition of Care [facility]) CM/SW Contact:    Iona Beard, [place] Phone Number: 07/07/2020, 6:20 PM  Clinical Narrative:                 Pt admitted due to acute pancreatitis. Pt is high risk for readmission and well known to TOC. TOC received consult for substance use education. CSW visited pt in ED to complete assessment. Pt states that she lives with her boyfriend. Pt is independent with ADLs and does not drive but has friends who will provide transportation. Pt has not used [facility] services nor does she use any DME. Pt denies substance use to CSW. CSW informed pt of resources CSW could provide if pt was interested. Pt states she was interested. CSW left resources with pt in ED room. TOC to follow.   Expected Discharge Plan: Home/Self Care Barriers to Discharge: Continued Medical Work up   Patient Goals and CMS Choice Patient states their goals for this hospitalization and ongoing recovery are:: Return home   Choice offered to / list presented to : NA  Expected Discharge Plan and Services Expected Discharge Plan: Home/Self Care In-house Referral: Clinical Social Work Discharge Planning Services: CM Consult Post Acute Care Choice: NA Living arrangements for the past 2 months: Single Family Home                 DME Arranged: N/A DME Agency: NA       HH Arranged: NA HH Agency: NA        Prior Living Arrangements/Services Living arrangements for the past 2 months: Single Family Home Lives with:: Significant Other Patient language and need for interpreter reviewed:: Yes Do you feel safe going back to the place where you live?: Yes            Criminal Activity/Legal Involvement Pertinent to Current Situation/Hospitalization: No - Comment as needed  Activities of Daily Living Home Assistive Devices/Equipment: None ADL Screening (condition at  time of admission) Patient's cognitive ability adequate to safely complete daily activities?: Yes Is the patient deaf or have difficulty hearing?: No Does the patient have difficulty seeing, even when wearing glasses/contacts?: No Does the patient have difficulty concentrating, remembering, or making decisions?: No Patient able to express need for assistance with ADLs?: Yes Does the patient have difficulty dressing or bathing?: No Independently performs ADLs?: Yes (appropriate for developmental age) Does the patient have difficulty walking or climbing stairs?: No Weakness of Legs: None Weakness of Arms/Hands: None  Permission Sought/Granted                  Emotional Assessment   Attitude/Demeanor/Rapport: Engaged Affect (typically observed): Accepting Orientation: : Oriented to Self,Oriented to Place,Oriented to  Time,Oriented to Situation Alcohol / Substance Use: Alcohol Use,Tobacco Use,Illicit Drugs Psych Involvement: No (comment)  Admission diagnosis:  Recurrent pancreatitis [K85.90] Acute pancreatitis [K85.90] Patient Active Problem List   Diagnosis Date Noted  . Recurrent pancreatitis 07/07/2020  . Acute on chronic pancreatitis ([place]) 07/07/2020  . Alcohol withdrawal seizure ([place]) 07/07/2020  . Leukocytosis 07/07/2020  . Acute pancreatitis 07/07/2020  . Seizure ([place]) 06/10/2020  . Abnormal LFTs 06/10/2020  . AKI (acute kidney injury) ([place]) 06/10/2020  . Fall 06/10/2020  . Hyponatremia 06/10/2020  . Laceration of forehead   . Sepsis due to Escherichia coli (E. coli) ([place]) 04/20/2020  . Acute pyelonephritis 04/18/2020  .  Sepsis ([place]) 04/17/2020  . Fatty liver, alcoholic/Severe hepatic steatosis 01/29/2020  . Pancreatitis, recurrent 01/26/2020  . Colitis 10/28/2019  . Polysubstance abuse ([place]) 10/28/2019  . Hypomagnesemia 10/28/2019  . Alcoholic hepatitis without ascites--Severe hepatic steatosis 10/28/2019  . Alcoholic gastritis 59/97/7414  . DTs (delirium  tremens) ([place])   . Urinary tract infection without hematuria   . Alcohol intoxication ([place]) 09/04/2019  . Alcohol withdrawal ([place]) 06/24/2019  . Sinus tachycardia 06/24/2019  . Essential hypertension 06/24/2019  . Hypokalemia 03/31/2019  . Cannabis abuse 03/11/2019  . Elevated transaminase level   . Alcohol abuse   . Closed displaced fracture of fifth metatarsal bone of right foot 08/02/2018  . Dental abscess 12/25/2017  . Gastric ulcer 10/14/2013  . Transaminitis 10/14/2013  . Tobacco dependence 10/14/2013  . Hepatitis C 10/14/2013  . Thrombocytopenia ([place]) 10/13/2013  . Duodenitis 10/12/2013  . Alcoholism ([place]) 10/12/2013  . Abdominal pain, epigastric 10/12/2013  . ANKLE PAIN 01/01/2008   PCP:  Patient, No Pcp Per Pharmacy:   [facility], [address] AT [address]. [place] [address] Phone: [phone] Fax: [phone]  [place], [place] - [place] [address] [place] [address] [place] [address] Phone: [phone] Fax: [phone]     Social Determinants of Health (SDOH) Interventions    Readmission Risk Interventions Readmission Risk Prevention Plan 07/07/2020  Transportation Screening Complete  Medication Review Press photographer) Complete  HRI or [place] Complete  SW Recovery Care/Counseling Consult Complete  Palliative Care Screening Not [place] Not Applicable  Some recent data might be hidden

## 2020-07-07 NOTE — ED Provider Notes (Signed)
[facility] EMERGENCY DEPARTMENT Provider Note   CSN: [id number] Arrival date & time: 07/06/20  2034   Time seen 5:00 AM  History Chief Complaint  Patient presents with  . Abdominal Pain    Olivia Lester is a 44 y.o. female.  HPI   Patient reports she started having upper abdominal pain the morning of December 13 that wraps around her abdomen and goes into her back.  She states she had been diagnosed with pancreatitis about a week ago and when she was discharged from the hospital she started drinking again.  She states she drinks 1/5 of liquor a day.  She also states she has withdrawal seizures because she tries to quit drinking at home.  She is never been to detox or rehab.  She states she was last admitted at [facility] for 3 days on November 30 and she also was Covid positive.  She states she had pneumonia but they report her chest x-ray was normal.  She describes abdominal pain as pressure in her abdomen is very sore to touch.  She states she has vomited 6 times since the pain started yesterday morning.  She had one episode of diarrhea around midnight.  She denies doing any types of drugs.  Patient has not taken the Covid vaccine.  PCP Patient, No Pcp Per   Past Medical History:  Diagnosis Date  . Alcohol abuse   . Back injury   . Gastric ulcer   . Gastric ulcer 10/14/2013  . Hepatitis C 10/14/2013  . History of kidney stones   . 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 Pancreatitis, acute   . Panic attack   . Pneumonia   . Polysubstance abuse ([place])   . PONV (postoperative nausea and vomiting)   . Seizures [facility])     Patient Active Problem List   Diagnosis Date Noted  . Recurrent pancreatitis 07/07/2020  . Seizure ([place]) 06/10/2020  . Abnormal LFTs 06/10/2020  . AKI (acute kidney injury) ([place]) 06/10/2020  . Fall 06/10/2020  . Hyponatremia 06/10/2020  . Laceration of forehead   . Sepsis due to Escherichia coli (E. coli) ([place]) 04/20/2020  . Acute pyelonephritis  04/18/2020  . Sepsis ([place]) 04/17/2020  . Fatty liver, alcoholic/Severe hepatic steatosis 01/29/2020  . Pancreatitis, recurrent 01/26/2020  . Colitis 10/28/2019  . Polysubstance abuse ([place]) 10/28/2019  . Hypomagnesemia 10/28/2019  . Alcoholic hepatitis without ascites--Severe hepatic steatosis 10/28/2019  . Alcoholic gastritis 62/83/6629  . DTs (delirium tremens) ([place])   . Urinary tract infection without hematuria   . Alcohol intoxication ([place]) 09/04/2019  . Alcohol withdrawal ([place]) 06/24/2019  . Sinus tachycardia 06/24/2019  . Essential hypertension 06/24/2019  . Hypokalemia 03/31/2019  . Cannabis abuse 03/11/2019  . Elevated transaminase level   . Alcohol abuse   . Closed displaced fracture of fifth metatarsal bone of right foot 08/02/2018  . Dental abscess 12/25/2017  . Gastric ulcer 10/14/2013  . Transaminitis 10/14/2013  . Tobacco dependence 10/14/2013  . Hepatitis C 10/14/2013  . Thrombocytopenia ([place]) 10/13/2013  . Duodenitis 10/12/2013  . Alcoholism ([place]) 10/12/2013  . Abdominal pain, epigastric 10/12/2013  . ANKLE PAIN 01/01/2008    Past Surgical History:  Procedure Laterality Date  . BACK SURGERY    . BREAST SURGERY    . ESOPHAGOGASTRODUODENOSCOPY (EGD) WITH PROPOFOL N/A 10/14/2013   Procedure: ESOPHAGOGASTRODUODENOSCOPY (EGD) WITH PROPOFOL;  Surgeon: Rogene Houston, MD;  Location: [facility];  Service: Endoscopy;  Laterality: N/A;  . ORIF TOE  FRACTURE Right 08/08/2018   Procedure: RIGHT FOOT INTERNAL FIXATION 5TH METATARSAL;  Surgeon: Newt Minion, MD;  Location: [facility];  Service: Orthopedics;  Laterality: Right;  . TONSILLECTOMY       OB History    Gravida  7   Para  3   Term  3   Preterm      AB  4   Living  3     SAB  2   IAB  2   Ectopic      Multiple      Live Births  3           Family History  Problem Relation Age of Onset  . Cancer Mother 87       ovarian   . Hypertension Mother   . Stroke Mother   . Early death Father   .  Alcohol abuse Father     Social History   Tobacco Use  . Smoking status: Current Every Day Smoker    Packs/day: 1.00    Years: 20.00    Pack years: 20.00    Types: Cigarettes  . Smokeless tobacco: Never Used  Vaping Use  . Vaping Use: Never used  Substance Use Topics  . Alcohol use: Yes    Alcohol/week: 18.0 standard drinks    Types: 18 Cans of beer per week    Comment: 1/5 -1/2 gallon of liqour daily or 18 beers daily  . Drug use: Yes    Types: Marijuana, Cocaine, Methamphetamines    Comment: denies current cocaine/meth...current mj  Drinks 1/5 of liquor daily for at least 2 years   Home Medications Prior to Admission medications   Medication Sig Start Date End Date Taking? Authorizing Provider  acetaminophen (TYLENOL) 325 MG tablet Take 2 tablets (650 mg total) by mouth every 6 (six) hours as needed for mild pain or headache (or Fever >/= 101). 04/23/20   Barton Dubois, MD  amLODipine (NORVASC) 5 MG tablet Take 1 tablet (5 mg total) by mouth daily. Patient not taking: Reported on 01/26/2020 10/28/19   Roxan Hockey, MD  folic acid (FOLVITE) 1 MG tablet Take 1 tablet (1 mg total) by mouth daily. Patient not taking: Reported on 01/07/2020 10/29/19   Roxan Hockey, MD  lipase/protease/amylase 24000-76000 units CPEP Take 1 capsule (24,000 Units total) by mouth 3 (three) times daily before meals. Patient not taking: Reported on 01/07/2020 10/29/19   Roxan Hockey, MD  Multiple Vitamin (MULTIVITAMIN WITH MINERALS) TABS tablet Take 1 tablet by mouth every other day.  Patient not taking: Reported on 01/07/2020    [provider]  pantoprazole (PROTONIX) 40 MG tablet Take 1 tablet (40 mg total) by mouth daily. 06/03/20 09/01/20  Harvest Dark, MD  potassium chloride (KLOR-CON) 10 MEQ tablet Take 1 tablet (10 mEq total) by mouth 2 (two) times daily. 06/03/20   Harvest Dark, MD  promethazine (PHENERGAN) 25 MG tablet Take 1 tablet (25 mg total) by mouth every 6 (six)  hours as needed for nausea or vomiting. 06/03/20   Harvest Dark, MD  thiamine 100 MG tablet Take 1 tablet (100 mg total) by mouth daily. Patient not taking: Reported on 01/07/2020 10/29/19   Roxan Hockey, MD    Allergies    Toradol [ketorolac tromethamine] and Diona Fanti [aspirin]  Review of Systems   Review of Systems  All other systems reviewed and are negative.   Physical Exam Updated Vital Signs BP (!) 127/97   Pulse 91   Temp 98.4 F (  36.9 C) (Oral)   Resp 20   Ht 5\' 7"  (1.702 m)   Wt 47.6 kg   SpO2 99%   BMI 16.45 kg/m   Physical Exam Vitals and nursing note reviewed.  Constitutional:      General: She is in acute distress.     Appearance: Normal appearance. She is normal weight. She is not ill-appearing or toxic-appearing.     Comments: crying  HENT:     Head: Normocephalic and atraumatic.     Right Ear: External ear normal.     Left Ear: External ear normal.     Nose: Nose normal.     Mouth/Throat:     Mouth: Mucous membranes are dry.     Comments: Patient has a tremor of her tongue Eyes:     Extraocular Movements: Extraocular movements intact.     Conjunctiva/sclera: Conjunctivae normal.     Pupils: Pupils are equal, round, and reactive to light.  Cardiovascular:     Rate and Rhythm: Normal rate and regular rhythm.     Pulses: Normal pulses.     Heart sounds: Normal heart sounds.  Pulmonary:     Effort: Pulmonary effort is normal. No respiratory distress.     Breath sounds: Normal breath sounds.  Abdominal:     General: Bowel sounds are normal.     Tenderness: There is generalized abdominal tenderness. There is no guarding or rebound.       Comments: She states her pain is her whole upper abdomen however she hurts anywhere I touch her to palpation of her abdomen  Musculoskeletal:        General: Normal range of motion.     Cervical back: Normal range of motion.  Skin:    General: Skin is warm and dry.  Neurological:     General: No focal deficit  present.     Mental Status: She is alert and oriented to person, place, and time.     Cranial Nerves: No cranial nerve deficit.     Comments: Patient is noted to have some mild tremor  Psychiatric:        Mood and Affect: Affect is labile.        Speech: Speech is rapid and pressured.        Behavior: Behavior is cooperative.     ED Results / Procedures / Treatments   Labs (all labs ordered are listed, but only abnormal results are displayed) Results for orders placed or performed during the hospital encounter of 07/07/20  Lipase, blood  Result Value Ref Range   Lipase 106 (H) 11 - 51 U/L  Comprehensive metabolic panel  Result Value Ref Range   Sodium 128 (L) 135 - 145 mmol/L   Potassium 2.8 (L) 3.5 - 5.1 mmol/L   Chloride 85 (L) 98 - 111 mmol/L   CO2 29 22 - 32 mmol/L   Glucose, Bld 93 70 - 99 mg/dL   BUN 9 6 - 20 mg/dL   Creatinine, Ser 0.40 (L) 0.44 - 1.00 mg/dL   Calcium 9.8 8.9 - 10.3 mg/dL   Total Protein 8.9 (H) 6.5 - 8.1 g/dL   Albumin 4.6 3.5 - 5.0 g/dL   AST 76 (H) 15 - 41 U/L   ALT 52 (H) 0 - 44 U/L   Alkaline Phosphatase 105 38 - 126 U/L   Total Bilirubin 0.8 0.3 - 1.2 mg/dL   GFR, Estimated >60 >60 mL/min   Anion gap 14 5 - 15  CBC  Result Value Ref Range   WBC 13.8 (H) 4.0 - 10.5 K/uL   RBC 4.88 3.87 - 5.11 MIL/uL   Hemoglobin 16.7 (H) 12.0 - 15.0 g/dL   HCT 47.6 (H) 36.0 - 46.0 %   MCV 97.5 80.0 - 100.0 fL   MCH 34.2 (H) 26.0 - 34.0 pg   MCHC 35.1 30.0 - 36.0 g/dL   RDW 14.6 11.5 - 15.5 %   Platelets 191 150 - 400 K/uL   nRBC 0.0 0.0 - 0.2 %  Ethanol  Result Value Ref Range   Alcohol, Ethyl (B) <10 <10 mg/dL  Magnesium  Result Value Ref Range   Magnesium 1.6 (L) 1.7 - 2.4 mg/dL   Laboratory interpretation all normal except hyponatremia, hypokalemia, leukocytosis, elevation of the hemoglobin consistent with dehydration, elevation of LFTs consistent with alcohol use, mild elevation of lipase    EKG EKG Interpretation  Date/Time:  Tuesday  July 07 2020 04:59:50 EST Ventricular Rate:  96 PR Interval:    QRS Duration: 87 QT Interval:  393 QTC Calculation: 497 R Axis:   53 Text Interpretation: Sinus rhythm Consider right atrial enlargement Borderline prolonged QT interval Since last tracing rate faster 30 Apr 2020 Confirmed by Rolland Porter [phone]) on 07/07/2020 6:22:55 AM      ED ECG REPORT   Date: 07/07/2020  Rate: 96  Rhythm: normal sinus rhythm  QRS Axis: normal  Intervals: QT prolonged  ST/T Wave abnormalities: normal and nonspecific ST/T changes  Conduction Disutrbances:none  Narrative Interpretation:   Old EKG Reviewed: none available  I have personally reviewed the EKG tracing and agree with the computerized printout as noted.   Radiology No results found.   CT Abdomen Pelvis with IV Contrast Only  Result Date: 06/23/2020 CLINICAL DATA: 44 year old female with epigastric abdominal pain, pancreatitis suspected.   1. Mild peripancreatic fat stranding as could be seen with acute pancreatitis in the appropriate clinical setting. No appreciable pancreatic parenchymal edema or ductal dilation. 2. Hepatic steatosis without morphologic changes of cirrhosis. Ruthann Cancer, MD [facility] Electronically Signed By: Ruthann Cancer MD On: 06/23/2020 08:48     Procedures .Critical Care Performed by: Rolland Porter, MD Authorized by: Rolland Porter, MD   Critical care provider statement:    Critical care time (minutes):  36   Critical care was necessary to treat or prevent imminent or life-threatening deterioration of the following conditions:  Dehydration and metabolic crisis   Critical care was time spent personally by me on the following activities:  Discussions with consultants, examination of patient, obtaining history from patient or surrogate, ordering and review of laboratory studies, pulse oximetry, re-evaluation of patient's condition and review of old  charts   (including critical care time)    Respiratory Pathogen Panel with COVID-19 (06/23/2020 9:27 AM EST) Respiratory Pathogen Panel with COVID-19 (06/23/2020 9:27 AM EST)  Component Value Ref Range Performed At Pathologist Signature  Adenovirus Not Detected Not Detected [facility] LABORATORY   Coronavirus PCR (229E, HKU1, NL63, OC43) Not Detected Not Detected [place] Not Detected Not Detected [place]   Rhinovirus/Enterovirus Not Detected Not Detected [place]   Influenza A Not Detected Not Detected [place]   Influenza A/H1 Not Detected Not Detected [place]   Influenza A/H3 Not Detected Not Detected [facility] LABORAT[facility]   Influenza A/H1-2009 Not Detected Not Detected [place]   Influenza B Not Detected Not Detected [place]  Parainfluenza 1 Not Detected Not Detected [facility] LABORATORY   Parainfluenza 2 Not Detected Not Detected [facility] LAB[facility]   Parainfluenza 3 Not Detected Not Detected [facility] LABORATORY   Parainfluenza 4 Not Detected Not Detected [place]   SARS-CoV-2 PCR Detected (A) Not Detected [place]   RSV A PCR Not Detected Not Detected [place]   RSV B PCR Not Detected Not Detected [place]   Chlamydophila (Chlamydia) pneumoniae Not Detected Not Detected [facility] LABORATORY   Mycoplasma pneumoniae Not Detected Not Detected [place]    Respiratory Pathogen Panel with COVID-19 (06/23/2020 9:27 AM EST)  Specimen  Nasopharyngeal Swab - Nasopharyngeal structure (body structure)   Respiratory Pathogen Panel with COVID-19 (06/23/2020 9:27 AM EST)  Narrative  [place] - 06/23/2020 11:35 AM  EST  Testing was performed using the Gen[employer] Dx Respiratory Panel 2 (RP2). Performance characteristics have been verified by the [facility]. A negative result does not rule out the possibility of infection and should be used in conjunction with other clinical findings as well as patient history. Positive results do not rule out co-infection with organisms not included in the RP2 panel. This assay does not distinguish between rhinovirus and enterovirus     Medications Ordered in ED Medications  potassium chloride 10 mEq in 100 mL IVPB (10 mEq Intravenous New Bag/Given 07/07/20 0631)  LORazepam (ATIVAN) injection 0-4 mg (3 mg Intravenous Given 07/07/20 0538)  LORazepam (ATIVAN) injection 0-4 mg (has no administration in time range)  thiamine (B-1) injection 100 mg (has no administration in time range)  sodium chloride 0.9 % bolus 1,000 mL (1,000 mLs Intravenous New Bag/Given 07/07/20 0538)  ondansetron (ZOFRAN) injection 4 mg (4 mg Intravenous Given 07/07/20 0538)  fentaNYL (SUBLIMAZE) injection 50 mcg (50 mcg Intravenous Given 07/07/20 0538)    ED Course  I have reviewed the triage vital signs and the nursing notes.  Pertinent labs & imaging results that were available during my care of the patient were reviewed by me and considered in my medical decision making (see chart for details).    MDM Rules/Calculators/A&P                          Patient was given IV fluids, IV pain and nausea medication.  Patient had a CT scan at [facility] November 30 and she had a CT scan of her abdomen pelvis here on November 10.  She has had 4 CT scans of the abdomen/pelvis in our facility since June 15.  I will talk to the hospitalist to see if they want the CT repeated again.  Her lipase is not that high.  Patient was started on CIWA protocol.  Her initial CIWA score was 27.  She was started on Ativan.   Patient was admitted on November 30 at [facility] with alcoholic  ketoacidosis, sepsis, pancreatitis, positive Covid test  Patient's magnesium was low, she was given IV magnesium.  6:23 AM patient was discussed with Dr.Zierle-Ghosh, hospitalist.  Discussed all of her recent CT scans and she agrees that 1 is not needed tonight.  She will admit.  She also requests another Covid test despite being positive on November 30.  Final Clinical Impression(s) / ED Diagnoses Final diagnoses:  Alcohol-induced acute pancreatitis, unspecified complication status  Non-intractable vomiting with nausea, unspecified vomiting type  Hypokalemia  Alcohol withdrawal syndrome without complication ([place])    Rx / DC  Orders  Plan admission  Rolland Porter, MD, Barbette Or, MD 07/07/20 [phone]

## 2020-07-07 NOTE — ED Notes (Signed)
4th dose of potassium hung

## 2020-07-07 NOTE — H&P (Addendum)
History and Physical  [facility]  Julieana Eshleman ZOX:[id number] DOB: January 25, 1976 DOA: 07/07/2020  PCP: Patient, No Pcp Per  Patient coming from: Home   I have personally briefly reviewed patient's old medical records in [place]  Chief Complaint: abdominal pain  HPI: Olivia Lester is a 44 y.o. female with medical history significant for recreational drug abuse, polysubstance abuse, multiple hospitalizations for acute pancreatitis, alcohol abuse, history of severe alcohol withdrawal including seizures, presents to the emergency department complaining of abdominal pain in the epigastrium similar to prior episodes of pancreatitis.  She was recently discharged from a different hospital approximately 1 week ago for similar admission for pancreatitis.  The patient reports that she started binging alcohol after arriving home.  She now presents with similar symptoms concerning for acute pancreatitis.  She also reports nausea and vomiting associated with the abdominal pain and retching.  She denies chest pain or shortness of breath.  She denies fever and chills.  ED Course: Patient vomited multiple times while being evaluated in the emergency department.  Her urine toxicology screen was positive for multiple substances including barbiturates, benzodiazepines and THC.  Urine pregnancy test negative.  Lipase was elevated at 106.  WBC 8.4, hemoglobin 15.6, hematocrit 46.9, platelet count 122.  Sodium 128, potassium 2.8, chloride 85, CO2 29, BUN 9, creatinine 0.40.  ALT 52, ALT 76.  Patient was given IV fluids nausea medications and pain medications and admission was requested for further management.  Review of Systems:  Review of Systems  Constitutional: Positive for malaise/fatigue and weight loss.  HENT: Positive for sore throat.   Eyes: Negative.   Respiratory: Positive for cough, hemoptysis and shortness of breath.   Cardiovascular: Positive for palpitations and leg swelling.  Gastrointestinal:  Positive for constipation, heartburn, nausea and vomiting.  Genitourinary: Positive for frequency and urgency.  Musculoskeletal: Negative for back pain and neck pain.  Skin: Positive for itching.  Neurological: Positive for dizziness, seizures and weakness.  Endo/Heme/Allergies: Negative.   Psychiatric/Behavioral: Negative.   All other systems reviewed and are negative.   Past Medical History:  Diagnosis Date  . Alcohol abuse   . Back injury   . Gastric ulcer   . Gastric ulcer 10/14/2013  . Hepatitis C 10/14/2013  . History of kidney stones   . 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 Pancreatitis, acute   . Panic attack   . Pneumonia   . Polysubstance abuse ([place])   . PONV (postoperative nausea and vomiting)   . Seizures ([place])     Past Surgical History:  Procedure Laterality Date  . BACK SURGERY    . BREAST SURGERY    . ESOPHAGOGASTRODUODENOSCOPY (EGD) WITH PROPOFOL N/A 10/14/2013   Procedure: ESOPHAGOGASTRODUODENOSCOPY (EGD) WITH PROPOFOL;  Surgeon: Rogene Houston, MD;  Location: [facility];  Service: Endoscopy;  Laterality: N/A;  . ORIF TOE FRACTURE Right 08/08/2018   Procedure: RIGHT FOOT INTERNAL FIXATION 5TH METATARSAL;  Surgeon: Newt Minion, MD;  Location: [place];  Service: Orthopedics;  Laterality: Right;  . TONSILLECTOMY       reports that she has been smoking cigarettes. She has a 20.00 pack-year smoking history. She has never used smokeless tobacco. She reports current alcohol use of about 18.0 standard drinks of alcohol per week. She reports current drug use. Drugs: Marijuana, Cocaine, and Methamphetamines.  Allergies  Allergen Reactions  . Toradol [Ketorolac Tromethamine] Other (See Comments)    Patient advised not to take due to  ulcers  . Asa [Aspirin] Rash    Family History  Problem Relation Age of Onset  . Cancer Mother 83       ovarian   . Hypertension Mother   . Stroke Mother   . Early death Father   . Alcohol abuse Father      Prior  to Admission medications   Medication Sig Start Date End Date Taking? Authorizing Provider  acetaminophen (TYLENOL) 325 MG tablet Take 2 tablets (650 mg total) by mouth every 6 (six) hours as needed for mild pain or headache (or Fever >/= 101). 04/23/20   Barton Dubois, MD  amLODipine (NORVASC) 5 MG tablet Take 1 tablet (5 mg total) by mouth daily. Patient not taking: Reported on 01/26/2020 10/28/19   Roxan Hockey, MD  folic acid (FOLVITE) 1 MG tablet Take 1 tablet (1 mg total) by mouth daily. Patient not taking: Reported on 01/07/2020 10/29/19   Roxan Hockey, MD  lipase/protease/amylase 24000-76000 units CPEP Take 1 capsule (24,000 Units total) by mouth 3 (three) times daily before meals. Patient not taking: Reported on 01/07/2020 10/29/19   Roxan Hockey, MD  Multiple Vitamin (MULTIVITAMIN WITH MINERALS) TABS tablet Take 1 tablet by mouth every other day.  Patient not taking: Reported on 01/07/2020    [provider]  pantoprazole (PROTONIX) 40 MG tablet Take 1 tablet (40 mg total) by mouth daily. 06/03/20 09/01/20  Harvest Dark, MD  potassium chloride (KLOR-CON) 10 MEQ tablet Take 1 tablet (10 mEq total) by mouth 2 (two) times daily. 06/03/20   Harvest Dark, MD  promethazine (PHENERGAN) 25 MG tablet Take 1 tablet (25 mg total) by mouth every 6 (six) hours as needed for nausea or vomiting. 06/03/20   Harvest Dark, MD  thiamine 100 MG tablet Take 1 tablet (100 mg total) by mouth daily. Patient not taking: Reported on 01/07/2020 10/29/19   Roxan Hockey, MD    Physical Exam: Vitals:   07/07/20 0630 07/07/20 0645 07/07/20 0700 07/07/20 0715  BP: (!) 147/110  (!) 141/110   Pulse:      Resp: 17 19 19  (!) 21  Temp:      TempSrc:      SpO2:      Weight:      Height:        Constitutional: Emaciated, chronically ill-appearing female, NAD, calm, comfortable Eyes: PERRL, lids and conjunctivae normal ENMT: Mucous membranes are dry. Posterior pharynx clear of any  exudate or lesions.poor dentition.  Neck: normal, supple, no masses, no thyromegaly Respiratory: clear to auscultation bilaterally, no wheezing, no crackles. Normal respiratory effort. No accessory muscle use.  Cardiovascular: Regular rate and rhythm, no murmurs / rubs / gallops. No extremity edema. 2+ pedal pulses. No carotid bruits.  Abdomen: Diffuse epigastric tenderness, no masses palpated. No hepatosplenomegaly. Bowel sounds positive.  Musculoskeletal: no clubbing / cyanosis. No joint deformity upper and lower extremities. Good ROM, no contractures. Normal muscle tone.  Skin: no rashes, lesions, ulcers. No induration Neurologic: CN 2-12 grossly intact. Sensation intact, DTR normal. Strength 5/5 in all 4.  Psychiatric: Poor judgment and insight. Alert and oriented x 3. Normal mood.   Labs on Admission: I have personally reviewed following labs and imaging studies  CBC: Recent Labs  Lab 07/06/20 2255  WBC 13.8*  HGB 16.7*  HCT 47.6*  MCV 97.5  PLT 144   Basic Metabolic Panel: Recent Labs  Lab 07/06/20 2255 07/07/20 0548  NA 128*  --   K 2.8*  --   CL  85*  --   CO2 29  --   GLUCOSE 93  --   BUN 9  --   CREATININE 0.40*  --   CALCIUM 9.8  --   MG  --  1.6*   GFR: Estimated Creatinine Clearance: 67.4 mL/min (A) (by C-G formula based on SCr of 0.4 mg/dL (L)). Liver Function Tests: Recent Labs  Lab 07/06/20 2255  AST 76*  ALT 52*  ALKPHOS 105  BILITOT 0.8  PROT 8.9*  ALBUMIN 4.6   Recent Labs  Lab 07/06/20 2255  LIPASE 106*   No results for input(s): AMMONIA in the last 168 hours. Coagulation Profile: No results for input(s): INR, PROTIME in the last 168 hours. Cardiac Enzymes: No results for input(s): CKTOTAL, CKMB, CKMBINDEX, TROPONINI in the last 168 hours. BNP (last 3 results) No results for input(s): PROBNP in the last 8760 hours. HbA1C: No results for input(s): HGBA1C in the last 72 hours. CBG: No results for input(s): GLUCAP in the last 168  hours. Lipid Profile: No results for input(s): CHOL, HDL, LDLCALC, TRIG, CHOLHDL, LDLDIRECT in the last 72 hours. Thyroid Function Tests: No results for input(s): TSH, T4TOTAL, FREET4, T3FREE, THYROIDAB in the last 72 hours. Anemia Panel: No results for input(s): VITAMINB12, FOLATE, FERRITIN, TIBC, IRON, RETICCTPCT in the last 72 hours. Urine analysis:    Component Value Date/Time   COLORURINE YELLOW (A) 06/10/2020 1157   APPEARANCEUR CLEAR (A) 06/10/2020 1157   APPEARANCEUR Clear 12/07/2013 0133   LABSPEC 1.013 06/10/2020 1157   LABSPEC 1.006 12/07/2013 0133   PHURINE 6.0 06/10/2020 1157   GLUCOSEU NEGATIVE 06/10/2020 1157   GLUCOSEU Negative 12/07/2013 0133   HGBUR LARGE (A) 06/10/2020 1157   BILIRUBINUR NEGATIVE 06/10/2020 1157   BILIRUBINUR Negative 12/07/2013 0133   KETONESUR 20 (A) 06/10/2020 1157   PROTEINUR 30 (A) 06/10/2020 1157   UROBILINOGEN 0.2 04/28/2015 1559   NITRITE NEGATIVE 06/10/2020 1157   LEUKOCYTESUR NEGATIVE 06/10/2020 1157   LEUKOCYTESUR Negative 12/07/2013 0133    Radiological Exams on Admission: No results found.  Assessment/Plan Active Problems:   Alcoholism ([place])   Thrombocytopenia (HCC)   Transaminitis   Tobacco dependence   Hepatitis C   Alcohol abuse   Hypokalemia   Essential hypertension   Polysubstance abuse ([place])   Hypomagnesemia   Alcoholic hepatitis without ascites--Severe hepatic steatosis   Pancreatitis, recurrent   Fatty liver, alcoholic/Severe hepatic steatosis   Hyponatremia   Recurrent pancreatitis   Acute on chronic pancreatitis (HCC)   Alcohol withdrawal seizure ([place])   Leukocytosis   Acute pancreatitis   1. Acute pancreatitis secondary to alcohol abuse-patient presents with epigastric pain that is radiating into the back associated with severe nausea and vomiting and reports recent alcohol abuse.  She presents with an elevated lipase level.  Admit to [facility] for further monitoring.  Maintain n.p.o. status except sips  ice chips and meds.'s.  Continue IV fluids as ordered.  IV pain medication and nausea medications ordered.  Follow CMP and lipase level. 2. Leukocytosis suspect reactive to acute pancreatitis which we are treating supportively.  Follow CBC with differential. 3. Polysubstance abuse-consult TOC service for counseling and treatment options. 4. Thrombocytopenia-this is chronic associated with chronic chronic alcohol abuse and liver injury. 5. Chronic alcohol abuse-patient has history of severe alcohol withdrawal including seizures.  She has been started on Librium 10 mg 3 times daily in addition to a CIWA protocol with lorazepam.  Continue to follow closely.  She is also been supplemented with thiamine, multivitamins  and folate cholic acid. 6. Hyponatremia-this is chronic secondary to her chronic alcoholism and chronic liver disease. 7. Transaminitis-secondary to chronic alcohol abuse and reported history of treated HCV.  follow LFTs closely. 8. Tobacco abuse-nicotine patch ordered, counseling on cessation performed at bedside. 9. Essential hypertension resume home blood pressure lowering medications and IV medications ordered as needed for elevated blood pressure readings. 10. Hypomagnesemia and hypokalemia-IV replacement ordered-repeat testing in a.m.  DVT prophylaxis: Enoxaparin Code Status: Full Family Communication: Plan of care discussed with patient at bedside who verbalized understanding Disposition Plan: Home when medically stabilized Consults called: N/A Admission status: Inpatient  Irwin Brakeman MD [facility] How to contact the [facility] Attending or Consulting provider [place] or covering provider during after hours [place], for this patient?  1. Check the care team in [facility] and look for a) attending/consulting [facility] provider listed and b) the [facility] team listed 2. Log into [email or web address] and use [place]'s universal password to access. If you do not have the password, please contact the  hospital operator. 3. Locate the [facility] provider you are looking for under [facility] and page to a number that you can be directly reached. 4. If you still have difficulty reaching the provider, please page the [facility] (Director on Call) for the Hospitalists listed on amion for assistance.   If 7PM-7AM, please contact night-coverage [email or web address] Password [facility]  07/07/2020, 7:28 AM

## 2020-07-07 NOTE — Progress Notes (Signed)
45 year old with recurrent etoh pancreatitis. Recently admitted at [facility] for same. 6 CTs in 6 months - so no CT today. Patient describing classic pancreatitis pain with 6 episodes of vomiting, and one diarrhea. Lipase minimally elevated. Hypokalemia consistent with n/v. Obs.

## 2020-07-08 DIAGNOSIS — K859 Acute pancreatitis without necrosis or infection, unspecified: Secondary | ICD-10-CM

## 2020-07-08 DIAGNOSIS — K861 Other chronic pancreatitis: Secondary | ICD-10-CM

## 2020-07-08 LAB — MAGNESIUM: Magnesium: 1.7 mg/dL (ref 1.7–2.4)

## 2020-07-08 LAB — COMPREHENSIVE METABOLIC PANEL
ALT: 52 U/L — ABNORMAL HIGH (ref 0–44)
AST: 101 U/L — ABNORMAL HIGH (ref 15–41)
Albumin: 3.2 g/dL — ABNORMAL LOW (ref 3.5–5.0)
Alkaline Phosphatase: 73 U/L (ref 38–126)
Anion gap: 10 (ref 5–15)
BUN: 9 mg/dL (ref 6–20)
CO2: 26 mmol/L (ref 22–32)
Calcium: 8.6 mg/dL — ABNORMAL LOW (ref 8.9–10.3)
Chloride: 96 mmol/L — ABNORMAL LOW (ref 98–111)
Creatinine, Ser: 0.35 mg/dL — ABNORMAL LOW (ref 0.44–1.00)
GFR, Estimated: >60 mL/min (ref >60)
Glucose, Bld: 83 mg/dL (ref 70–99)
Potassium: 3.1 mmol/L — ABNORMAL LOW (ref 3.5–5.1)
Sodium: 132 mmol/L — ABNORMAL LOW (ref 135–145)
Total Bilirubin: 1 mg/dL (ref 0.3–1.2)
Total Protein: 6.2 g/dL — ABNORMAL LOW (ref 6.5–8.1)

## 2020-07-08 LAB — CBC WITH DIFFERENTIAL/PLATELET
Abs Immature Granulocytes: 0.05 10*3/uL (ref 0.00–0.07)
Basophils Absolute: 0 10*3/uL (ref 0.0–0.1)
Basophils Relative: 0 %
Eosinophils Absolute: 0 10*3/uL (ref 0.0–0.5)
Eosinophils Relative: 1 %
HCT: 40.2 % (ref 36.0–46.0)
Hemoglobin: 13.6 g/dL (ref 12.0–15.0)
Immature Granulocytes: 1 %
Lymphocytes Relative: 34 %
Lymphs Abs: 2 10*3/uL (ref 0.7–4.0)
MCH: 33.7 pg (ref 26.0–34.0)
MCHC: 33.8 g/dL (ref 30.0–36.0)
MCV: 99.5 fL (ref 80.0–100.0)
Monocytes Absolute: 0.3 10*3/uL (ref 0.1–1.0)
Monocytes Relative: 5 %
Neutro Abs: 3.6 10*3/uL (ref 1.7–7.7)
Neutrophils Relative %: 59 %
Platelets: 101 10*3/uL — ABNORMAL LOW (ref 150–400)
RBC: 4.04 MIL/uL (ref 3.87–5.11)
RDW: 14.2 % (ref 11.5–15.5)
WBC: 6 10*3/uL (ref 4.0–10.5)
nRBC: 0 % (ref 0.0–0.2)

## 2020-07-08 LAB — LIPASE, BLOOD: Lipase: 33 U/L (ref 11–51)

## 2020-07-08 MED ORDER — SODIUM CHLORIDE 0.9 % IV BOLUS
1000.0000 mL | Freq: Once | INTRAVENOUS | Status: AC
  Administered 2020-07-08: 16:00:00 1000 mL via INTRAVENOUS

## 2020-07-08 MED ORDER — LACTATED RINGERS IV BOLUS: 1000.0000 mL | Freq: Once | INTRAVENOUS | Status: DC

## 2020-07-08 MED ORDER — POTASSIUM CHLORIDE CRYS ER 20 MEQ PO TBCR
40.0000 meq | EXTENDED_RELEASE_TABLET | Freq: Two times a day (BID) | ORAL | Status: AC
  Administered 2020-07-08 (×2): 40 meq via ORAL
  Filled 2020-07-08 (×2): qty 2

## 2020-07-08 MED ORDER — COVID-19 MRNA VACC (MODERNA) 100 MCG/0.5ML IM SUSP
0.5000 mL | Freq: Once | INTRAMUSCULAR | Status: AC
  Administered 2020-07-09: 17:00:00 0.5 mL via INTRAMUSCULAR
  Filled 2020-07-08: qty 0.5

## 2020-07-08 NOTE — Plan of Care (Signed)
  Problem: Education: Goal: Knowledge of General Education information will improve Description Including pain rating scale, medication(s)/side effects and non-pharmacologic comfort measures Outcome: Progressing   

## 2020-07-08 NOTE — Clinical Social Work Note (Signed)
CSW attempted to complete social determinants of health questions with pt. Pt stating it is time for her pain medications and asked for CSW to update RN before asking questions. CSW returned to ask questions, pt became agitated with CSW stating she has answered all these questions multiple times. CSW attempted to complete first set of questions pt agitated and states she is in pain and asking how long questions would take. CSW informed pt she could come back at a later time, pt thankful and agreeable. CSW to attempt to finish questions at a later time.

## 2020-07-08 NOTE — Progress Notes (Addendum)
PROGRESS NOTE    Olivia Lester  DZH:[id number] DOB: 13-Oct-1975 DOA: 07/07/2020 PCP: Patient, No Pcp Per   Brief Narrative:  Per HPI: Olivia Lester is a 44 y.o. female with medical history significant for recreational drug abuse, polysubstance abuse, multiple hospitalizations for acute pancreatitis, alcohol abuse, history of severe alcohol withdrawal including seizures, presents to the emergency department complaining of abdominal pain in the epigastrium similar to prior episodes of pancreatitis.  She was recently discharged from a different hospital approximately 1 week ago for similar admission for pancreatitis.  The patient reports that she started binging alcohol after arriving home.  She now presents with similar symptoms concerning for acute pancreatitis.  She also reports nausea and vomiting associated with the abdominal pain and retching.  She denies chest pain or shortness of breath.  She denies fever and chills.  -Patient has been admitted with acute pancreatitis in the setting of ongoing alcohol abuse.  She continues to have ongoing pain requiring IV fluid and remains n.p.o.  Assessment & Plan:   Active Problems:   Alcoholism ([place])   Thrombocytopenia (HCC)   Transaminitis   Tobacco dependence   Hepatitis C   Alcohol abuse   Hypokalemia   Essential hypertension   Polysubstance abuse ([place])   Hypomagnesemia   Alcoholic hepatitis without ascites--Severe hepatic steatosis   Pancreatitis, recurrent   Fatty liver, alcoholic/Severe hepatic steatosis   Hyponatremia   Recurrent pancreatitis   Acute on chronic pancreatitis (HCC)   Alcohol withdrawal seizure ([place])   Leukocytosis   Acute pancreatitis   1. Acute pancreatitis secondary to alcohol abuse-patient presents with epigastric pain that is radiating into the back associated with severe nausea and vomiting and reports recent alcohol abuse.  She presents with an elevated lipase level.  Admit to [place] for further monitoring.  Maintain  n.p.o. status except sips ice chips and meds.'s.  Continue IV fluids as ordered.  IV pain medication and nausea medications ordered.  Follow CMP and lipase level.  She has recently had multiple CTs in the last several months.  Plan to obtain repeat CT if she is not clinically progressing. 2. Leukocytosis suspect reactive to acute pancreatitis which we are treating supportively.  Follow CBC with differential. 3. Polysubstance abuse-consult TOC service for counseling and treatment options. 4. Thrombocytopenia-this is chronic associated with chronic chronic alcohol abuse and liver injury. 5. Chronic alcohol abuse-patient has history of severe alcohol withdrawal including seizures.  She has been started on Librium 10 mg 3 times daily in addition to a CIWA protocol with lorazepam.  Continue to follow closely.  She is also been supplemented with thiamine, multivitamins and folate cholic acid. 6. Hyponatremia-this is chronic secondary to her chronic alcoholism and chronic liver disease. 7. Transaminitis-secondary to chronic alcohol abuse and reported history of treated HCV.  follow LFTs closely. 8. Tobacco abuse-nicotine patch ordered, counseling on cessation performed at bedside. 9. Essential hypertension resume home blood pressure lowering medications and IV medications ordered as needed for elevated blood pressure readings. 10. Hypokalemia-IV replacement ordered-repeat testing in a.m.  DVT prophylaxis:Lovenox Code Status: Full Family Communication: None at bedside Disposition Plan:  Status is: Inpatient  Remains inpatient appropriate because:IV treatments appropriate due to intensity of illness or inability to take PO and Inpatient level of care appropriate due to severity of illness   Dispo: The patient is from: Home              Anticipated d/c is to: Home  Anticipated d/c date is: 2 days              Patient currently is not medically stable to d/c.  Patient requires ongoing IV  fluid and pain management.   Consultants:   None  Procedures:   None  Antimicrobials:   None   Subjective: Patient seen and evaluated today with ongoing abdominal pain noted with radiation to her back as well as with some persistent nausea.  No vomiting noted.  Objective: Vitals:   07/07/20 2024 07/07/20 2200 07/08/20 0155 07/08/20 0558  BP: (!) 149/96 (!) 164/108 (!) 160/98 (!) 140/97  Pulse: 97 77 78 72  Resp: 16 20 18 18   Temp: 99.1 F (37.3 C) 98.8 F (37.1 C) 98.2 F (36.8 C) 98 F (36.7 C)  TempSrc: Oral Oral Oral Oral  SpO2: 100% 98% 100% 100%  Weight:      Height:        Intake/Output Summary (Last 24 hours) at 07/08/2020 1421 Last data filed at 07/08/2020 0319 Gross per 24 hour  Intake 1871.24 ml  Output --  Net 1871.24 ml   Filed Weights   07/06/20 2054  Weight: 47.6 kg    Examination:  General exam: Appears calm and comfortable  Respiratory system: Clear to auscultation. Respiratory effort normal. Cardiovascular system: S1 & S2 heard, RRR.  Gastrointestinal system: Abdomen is soft. Central nervous system: Alert and awake Extremities: No edema Skin: No rashes, lesions or ulcers Psychiatry: Flat affect    Data Reviewed: I have personally reviewed following labs and imaging studies  CBC: Recent Labs  Lab 07/06/20 2255 07/07/20 0813 07/08/20 0441  WBC 13.8* 8.4 6.0  NEUTROABS  --   --  3.6  HGB 16.7* 15.6* 13.6  HCT 47.6* 46.9* 40.2  MCV 97.5 101.1* 99.5  PLT 191 122* 956*   Basic Metabolic Panel: Recent Labs  Lab 07/06/20 2255 07/07/20 0548 07/07/20 0813 07/08/20 0441  NA 128*  --   --  132*  K 2.8*  --   --  3.1*  CL 85*  --   --  96*  CO2 29  --   --  26  GLUCOSE 93  --   --  83  BUN 9  --   --  9  CREATININE 0.40*  --  0.39* 0.35*  CALCIUM 9.8  --   --  8.6*  MG  --  1.6*  --  1.7   GFR: Estimated Creatinine Clearance: 67.4 mL/min (A) (by C-G formula based on SCr of 0.35 mg/dL (L)). Liver Function Tests: Recent  Labs  Lab 07/06/20 2255 07/08/20 0441  AST 76* 101*  ALT 52* 52*  ALKPHOS 105 73  BILITOT 0.8 1.0  PROT 8.9* 6.2*  ALBUMIN 4.6 3.2*   Recent Labs  Lab 07/06/20 2255 07/08/20 0441  LIPASE 106* 33   No results for input(s): AMMONIA in the last 168 hours. Coagulation Profile: No results for input(s): INR, PROTIME in the last 168 hours. Cardiac Enzymes: No results for input(s): CKTOTAL, CKMB, CKMBINDEX, TROPONINI in the last 168 hours. BNP (last 3 results) No results for input(s): PROBNP in the last 8760 hours. HbA1C: No results for input(s): HGBA1C in the last 72 hours. CBG: No results for input(s): GLUCAP in the last 168 hours. Lipid Profile: No results for input(s): CHOL, HDL, LDLCALC, TRIG, CHOLHDL, LDLDIRECT in the last 72 hours. Thyroid Function Tests: No results for input(s): TSH, T4TOTAL, FREET4, T3FREE, THYROIDAB in the last 72 hours. Anemia Panel:  No results for input(s): VITAMINB12, FOLATE, FERRITIN, TIBC, IRON, RETICCTPCT in the last 72 hours. Sepsis Labs: No results for input(s): PROCALCITON, LATICACIDVEN in the last 168 hours.  No results found for this or any previous visit (from the past 240 hour(s)).       Radiology Studies: No results found.      Scheduled Meds: . chlordiazePOXIDE  10 mg Oral TID  . enoxaparin (LOVENOX) injection  40 mg Subcutaneous Q24H  . folic acid  1 mg Oral Daily  . LORazepam  0-4 mg Intravenous Q6H   Followed by  . [START ON 07/09/2020] LORazepam  0-4 mg Intravenous Q12H  . multivitamin with minerals  1 tablet Oral Daily  . nicotine  21 mg Transdermal Daily  . pantoprazole (PROTONIX) IV  40 mg Intravenous Q24H  . potassium chloride  40 mEq Oral BID  . thiamine  100 mg Oral Daily   Or  . thiamine  100 mg Intravenous Daily   Continuous Infusions: . lactated ringers    . lactated ringers 135 mL/hr at 07/07/20 2224     LOS: 1 day    Time spent: 35 minutes    Hinata Diener Darleen Crocker, DO [facility]  If  7PM-7AM, please contact night-coverage [email or web address] 07/08/2020, 2:21 PM

## 2020-07-09 ENCOUNTER — Inpatient Hospital Stay (HOSPITAL_COMMUNITY): Payer: Self-pay

## 2020-07-09 LAB — CBC WITH DIFFERENTIAL/PLATELET
Abs Immature Granulocytes: 0.04 10*3/uL (ref 0.00–0.07)
Basophils Absolute: 0 10*3/uL (ref 0.0–0.1)
Basophils Relative: 0 %
Eosinophils Absolute: 0.1 10*3/uL (ref 0.0–0.5)
Eosinophils Relative: 2 %
HCT: 38.8 % (ref 36.0–46.0)
Hemoglobin: 13.1 g/dL (ref 12.0–15.0)
Immature Granulocytes: 1 %
Lymphocytes Relative: 35 %
Lymphs Abs: 1.6 10*3/uL (ref 0.7–4.0)
MCH: 34 pg (ref 26.0–34.0)
MCHC: 33.8 g/dL (ref 30.0–36.0)
MCV: 100.8 fL — ABNORMAL HIGH (ref 80.0–100.0)
Monocytes Absolute: 0.3 10*3/uL (ref 0.1–1.0)
Monocytes Relative: 5 %
Neutro Abs: 2.6 10*3/uL (ref 1.7–7.7)
Neutrophils Relative %: 57 %
Platelets: 85 10*3/uL — ABNORMAL LOW (ref 150–400)
RBC: 3.85 MIL/uL — ABNORMAL LOW (ref 3.87–5.11)
RDW: 13.9 % (ref 11.5–15.5)
WBC: 4.7 10*3/uL (ref 4.0–10.5)
nRBC: 0 % (ref 0.0–0.2)

## 2020-07-09 LAB — COMPREHENSIVE METABOLIC PANEL
ALT: 48 U/L — ABNORMAL HIGH (ref 0–44)
AST: 61 U/L — ABNORMAL HIGH (ref 15–41)
Albumin: 3.1 g/dL — ABNORMAL LOW (ref 3.5–5.0)
Alkaline Phosphatase: 66 U/L (ref 38–126)
Anion gap: 8 (ref 5–15)
BUN: 9 mg/dL (ref 6–20)
CO2: 24 mmol/L (ref 22–32)
Calcium: 8.6 mg/dL — ABNORMAL LOW (ref 8.9–10.3)
Chloride: 96 mmol/L — ABNORMAL LOW (ref 98–111)
Creatinine, Ser: 0.36 mg/dL — ABNORMAL LOW (ref 0.44–1.00)
GFR, Estimated: >60 mL/min (ref >60)
Glucose, Bld: 94 mg/dL (ref 70–99)
Potassium: 3.8 mmol/L (ref 3.5–5.1)
Sodium: 128 mmol/L — ABNORMAL LOW (ref 135–145)
Total Bilirubin: 1 mg/dL (ref 0.3–1.2)
Total Protein: 5.9 g/dL — ABNORMAL LOW (ref 6.5–8.1)

## 2020-07-09 LAB — LIPASE, BLOOD: Lipase: 28 U/L (ref 11–51)

## 2020-07-09 LAB — OSMOLALITY, URINE: Osmolality, Ur: 429 mOsm/kg (ref 300–900)

## 2020-07-09 LAB — MAGNESIUM: Magnesium: 1.5 mg/dL — ABNORMAL LOW (ref 1.7–2.4)

## 2020-07-09 LAB — OSMOLALITY: Osmolality: 280 mOsm/kg (ref 275–295)

## 2020-07-09 LAB — SODIUM, URINE, RANDOM: Sodium, Ur: 117 mmol/L

## 2020-07-09 MED ORDER — IOHEXOL 300 MG/ML  SOLN
75.0000 mL | Freq: Once | INTRAMUSCULAR | Status: AC | PRN
  Administered 2020-07-09: 75 mL via INTRAVENOUS

## 2020-07-09 MED ORDER — IOHEXOL 9 MG/ML PO SOLN
ORAL | Status: AC
  Filled 2020-07-09: qty 500

## 2020-07-09 MED ORDER — MAGNESIUM SULFATE 2 GM/50ML IV SOLN
2.0000 g | Freq: Once | INTRAVENOUS | Status: AC
  Administered 2020-07-09: 08:00:00 2 g via INTRAVENOUS
  Filled 2020-07-09: qty 50

## 2020-07-09 MED ORDER — SODIUM CHLORIDE 0.9 % IV SOLN: INTRAVENOUS | Status: DC

## 2020-07-09 NOTE — Progress Notes (Signed)
Notified MD of patients blood pressure of 161/102 HR 72. Stated that patient has prn Metoprolol ordered but parameters have not been met. No new orders obtained will recheck blood pressure in an hour.

## 2020-07-09 NOTE — Progress Notes (Signed)
PROGRESS NOTE    Olivia Lester  ZOX:[id number] DOB: 1976/06/15 DOA: 07/07/2020 PCP: Patient, No Pcp Per   Brief Narrative:  Per HPI: Olivia Fryeis a 44 y.o.femalewith medical history significantforrecreational drug abuse, polysubstance abuse, multiple hospitalizations for acute pancreatitis, alcohol abuse, history of severe alcohol withdrawal including seizures, presents to the emergency department complaining of abdominal pain in the epigastrium similar to prior episodes of pancreatitis. She was recently discharged from a different hospital approximately 1 week ago for similar admission for pancreatitis. The patient reports that she started binging alcohol after arriving home. She now presents with similar symptoms concerning for acute pancreatitis. She also reports nausea and vomiting associated with the abdominal pain and retching. She denies chest pain or shortness of breath. She denies fever and chills.  -Patient has been admitted with acute pancreatitis in the setting of ongoing alcohol abuse.  She continues to have ongoing pain and requests Dilaudid quite frequently.   Assessment & Plan:   Active Problems:   Alcoholism ([place])   Thrombocytopenia ([facility])   Transaminitis   Tobacco dependence   Hepatitis C   Alcohol abuse   Hypokalemia   Essential hypertension   Polysubstance abuse ([place])   Hypomagnesemia   Alcoholic hepatitis without ascites--Severe hepatic steatosis   Pancreatitis, recurrent   Fatty liver, alcoholic/Severe hepatic steatosis   Hyponatremia   Recurrent pancreatitis   Acute on chronic pancreatitis ([facility])   Alcohol withdrawal seizure ([place])   Leukocytosis   Acute pancreatitis   1. Acute pancreatitis secondary to alcohol abuse-patient presents with epigastric pain that is radiating into the back associated with severe nausea and vomiting and reports recent alcohol abuse. She presents with an elevated lipase level. Admit to [facility] for further monitoring.  Maintain n.p.o. status except sips ice chips and meds.'s. Continue IV fluids as ordered. IV pain medication and nausea medications ordered. Follow CMP and lipase level.  She has recently had multiple CTs in the last several months.    Repeat CT scan performed today with no acute findings of pancreatitis, however there is some mild common bile duct dilation without any significant cholelithiasis.  Bilirubin levels have not been elevated.  She continues to have pain and plan will be to check ultrasound of the right upper quadrant to further evaluate and consider MRCP and GI consultation as needed.  Tried clear liquid diet, but continues to have significant pain.  Back to n.p.o. for now. 2. Leukocytosis suspect reactive to acute pancreatitis which we are treating supportively. This has resolved.  Plan to follow CBC. 3. Polysubstance abuse-consult TOC servicefor counseling and treatment options. 4. Thrombocytopenia-this is chronic associated with chronic chronic alcohol abuse and liver injury. 5. Chronic alcohol abuse-patient has history of severe alcohol withdrawal including seizures. She has been started on Librium 10 mg 3 times daily in addition to a CIWA protocol with lorazepam. Continue to follow closely. She is also been supplemented with thiamine, multivitamins and folate cholic acid. 6. Hyponatremia-this is chronic secondary to her chronic alcoholism and chronic liver disease. 7. Transaminitis-secondary to chronic alcohol abuseand reported history of treated HCV.follow LFTs closely.  These are currently downtrending. 8. Tobacco abuse-nicotine patch ordered, counseling on cessation performed at bedside. 9. Essential hypertension resume home blood pressure lowering medications and IV medications ordered as needed for elevated blood pressure readings.  DVT prophylaxis:Lovenox Code Status: Full Family Communication: None at bedside Disposition Plan:  Status is: Inpatient  Remains  inpatient appropriate because:IV treatments appropriate due to intensity of illness or inability to take  PO and Inpatient level of care appropriate due to severity of illness   Dispo: The patient is from: Home  Anticipated d/c is to: Home  Anticipated d/c date is: 2 days  Patient currently is not medically stable to d/c.  Patient requires ongoing IV fluid and pain management.   Consultants:   None  Procedures:   None  Antimicrobials:   None  Subjective: Patient seen and evaluated today with ongoing abdominal pain noted.  She is asking for clear liquid diet.  Objective: Vitals:   07/08/20 0558 07/08/20 1458 07/08/20 2113 07/09/20 0355  BP: (!) 140/97 (!) 150/102 (!) 158/103 (!) 145/109  Pulse: 72 90 80 79  Resp: 18 17 19 18   Temp: 98 F (36.7 C) 98.8 F (37.1 C) 97.7 F (36.5 C) 97.6 F (36.4 C)  TempSrc: Oral Oral    SpO2: 100% 100% 100% 100%  Weight:      Height:        Intake/Output Summary (Last 24 hours) at 07/09/2020 1433 Last data filed at 07/08/2020 1611 Gross per 24 hour  Intake 183.15 ml  Output --  Net 183.15 ml   Filed Weights   07/06/20 2054  Weight: 47.6 kg    Examination:  General exam: Appears calm and comfortable, hard of hearing. Respiratory system: Clear to auscultation. Respiratory effort normal. Cardiovascular system: S1 & S2 heard, RRR. Gastrointestinal system: Abdomen is soft, tender to palpation Central nervous system: Alert and oriented. No focal neurological deficits. Extremities: No edema Skin: No rashes, lesions or ulcers Psychiatry: Judgement and insight appear normal. Mood & affect appropriate.     Data Reviewed: I have personally reviewed following labs and imaging studies  CBC: Recent Labs  Lab 07/06/20 2255 07/07/20 0813 07/08/20 0441 07/09/20 0548  WBC 13.8* 8.4 6.0 4.7  NEUTROABS  --   --  3.6 2.6  HGB 16.7* 15.6* 13.6 13.1  HCT 47.6* 46.9* 40.2 38.8  MCV 97.5  101.1* 99.5 100.8*  PLT 191 122* 101* 85*   Basic Metabolic Panel: Recent Labs  Lab 07/06/20 2255 07/07/20 0548 07/07/20 0813 07/08/20 0441 07/09/20 0548  NA 128*  --   --  132* 128*  K 2.8*  --   --  3.1* 3.8  CL 85*  --   --  96* 96*  CO2 29  --   --  26 24  GLUCOSE 93  --   --  83 94  BUN 9  --   --  9 9  CREATININE 0.40*  --  0.39* 0.35* 0.36*  CALCIUM 9.8  --   --  8.6* 8.6*  MG  --  1.6*  --  1.7 1.5*   GFR: Estimated Creatinine Clearance: 67.4 mL/min (A) (by C-G formula based on SCr of 0.36 mg/dL (L)). Liver Function Tests: Recent Labs  Lab 07/06/20 2255 07/08/20 0441 07/09/20 0548  AST 76* 101* 61*  ALT 52* 52* 48*  ALKPHOS 105 73 66  BILITOT 0.8 1.0 1.0  PROT 8.9* 6.2* 5.9*  ALBUMIN 4.6 3.2* 3.1*   Recent Labs  Lab 07/06/20 2255 07/08/20 0441 07/09/20 0548  LIPASE 106* 33 28   No results for input(s): AMMONIA in the last 168 hours. Coagulation Profile: No results for input(s): INR, PROTIME in the last 168 hours. Cardiac Enzymes: No results for input(s): CKTOTAL, CKMB, CKMBINDEX, TROPONINI in the last 168 hours. BNP (last 3 results) No results for input(s): PROBNP in the last 8760 hours. HbA1C: No results for input(s): HGBA1C in  the last 72 hours. CBG: No results for input(s): GLUCAP in the last 168 hours. Lipid Profile: No results for input(s): CHOL, HDL, LDLCALC, TRIG, CHOLHDL, LDLDIRECT in the last 72 hours. Thyroid Function Tests: No results for input(s): TSH, T4TOTAL, FREET4, T3FREE, THYROIDAB in the last 72 hours. Anemia Panel: No results for input(s): VITAMINB12, FOLATE, FERRITIN, TIBC, IRON, RETICCTPCT in the last 72 hours. Sepsis Labs: No results for input(s): PROCALCITON, LATICACIDVEN in the last 168 hours.  No results found for this or any previous visit (from the past 240 hour(s)).       Radiology Studies: CT ABDOMEN PELVIS W CONTRAST  Result Date: 07/09/2020 CLINICAL DATA:  Inpatient. Epigastric abdominal pain radiating to  the back with nausea and vomiting for 3 days. History of pancreatitis and hepatitis C. EXAM: CT ABDOMEN AND PELVIS WITH CONTRAST TECHNIQUE: Multidetector CT imaging of the abdomen and pelvis was performed using the standard protocol following bolus administration of intravenous contrast. CONTRAST:  8mL OMNIPAQUE IOHEXOL 300 MG/ML  SOLN COMPARISON:  06/03/2020 CT abdomen/pelvis. FINDINGS: Lower chest: No significant pulmonary nodules or acute consolidative airspace disease. Hepatobiliary: Diffuse hepatic steatosis. Normal liver size with no definite liver surface irregularity. No liver masses. Normal gallbladder with no radiopaque cholelithiasis. No intrahepatic biliary ductal dilatation. Mildly dilated common bile duct (8 mm diameter), previously 5 mm, increased. No radiopaque choledocholithiasis. Pancreas: Normal, with no mass or duct dilation. Spleen: Normal size. No mass. Adrenals/Urinary Tract: Normal adrenals. Normal kidneys with no hydronephrosis and no renal mass. Normal bladder. Stomach/Bowel: Normal non-distended stomach. Normal caliber small bowel with no small bowel wall thickening. Normal appendix. Normal large bowel with no diverticulosis, large bowel wall thickening or pericolonic fat stranding. Vascular/Lymphatic: Atherosclerotic nonaneurysmal abdominal aorta. Patent portal, splenic, hepatic and renal veins. No pathologically enlarged lymph nodes in the abdomen or pelvis. 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 No left adnexal mass. Grossly normal uterus. Other: No pneumoperitoneum, ascites or focal fluid collection. Subcutaneous gas in ventral left lower abdominal wall is presumably related to subcutaneous injection medication. Musculoskeletal: No aggressive appearing focal osseous lesions. Status post bilateral posterior spinal fusion in the lower lumbar spine. IMPRESSION: 1. Mildly dilated common bile duct (8 mm  diameter), increased from recent prior CT, without intrahepatic biliary ductal dilatation. No radiopaque choledocholithiasis. Recommend correlation with serum bilirubin levels. If there is clinical concern for acute biliary pathology, consider further evaluation with MRI abdomen with MRCP without and with IV contrast. 2. No evidence of bowel obstruction or acute bowel inflammation. Normal appendix. 3. Diffuse hepatic steatosis. 4. 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 5. Aortic Atherosclerosis (ICD10-I70.0). Electronically Signed   By: Ilona Sorrel M.D.   On: 07/09/2020 12:25        Scheduled Meds: . chlordiazePOXIDE  10 mg Oral TID  . COVID-19 mRNA vaccine (Moderna)  0.5 mL Intramuscular Once  . enoxaparin (LOVENOX) injection  40 mg Subcutaneous Q24H  . folic acid  1 mg Oral Daily  . iohexol      . LORazepam  0-4 mg Intravenous Q12H  . multivitamin with minerals  1 tablet Oral Daily  . nicotine  21 mg Transdermal Daily  . pantoprazole (PROTONIX) IV  40 mg Intravenous Q24H  . thiamine  100 mg Oral Daily   Or  . thiamine  100 mg Intravenous Daily   Continuous Infusions: . sodium chloride 150 mL/hr at 07/09/20 0812     LOS: 2 days  Time spent: 35 minutes    Agapito Hanway Darleen Crocker, DO [facility]  If 7PM-7AM, please contact night-coverage [email or web address] 07/09/2020, 2:33 PM

## 2020-07-10 ENCOUNTER — Inpatient Hospital Stay (HOSPITAL_COMMUNITY): Payer: Self-pay

## 2020-07-10 DIAGNOSIS — K852 Alcohol induced acute pancreatitis without necrosis or infection: Principal | ICD-10-CM

## 2020-07-10 LAB — HEMOGLOBIN AND HEMATOCRIT, BLOOD
HCT: 41.6 % (ref 36.0–46.0)
Hemoglobin: 14 g/dL (ref 12.0–15.0)

## 2020-07-10 LAB — COMPREHENSIVE METABOLIC PANEL
ALT: 59 U/L — ABNORMAL HIGH (ref 0–44)
AST: 87 U/L — ABNORMAL HIGH (ref 15–41)
Albumin: 3.2 g/dL — ABNORMAL LOW (ref 3.5–5.0)
Alkaline Phosphatase: 70 U/L (ref 38–126)
Anion gap: 8 (ref 5–15)
BUN: 5 mg/dL — ABNORMAL LOW (ref 6–20)
CO2: 24 mmol/L (ref 22–32)
Calcium: 8.7 mg/dL — ABNORMAL LOW (ref 8.9–10.3)
Chloride: 103 mmol/L (ref 98–111)
Creatinine, Ser: 0.37 mg/dL — ABNORMAL LOW (ref 0.44–1.00)
GFR, Estimated: >60 mL/min (ref >60)
Glucose, Bld: 90 mg/dL (ref 70–99)
Potassium: 3.6 mmol/L (ref 3.5–5.1)
Sodium: 135 mmol/L (ref 135–145)
Total Bilirubin: 0.7 mg/dL (ref 0.3–1.2)
Total Protein: 6.2 g/dL — ABNORMAL LOW (ref 6.5–8.1)

## 2020-07-10 LAB — CBC WITH DIFFERENTIAL/PLATELET
Abs Immature Granulocytes: 0.05 10*3/uL (ref 0.00–0.07)
Basophils Absolute: 0 10*3/uL (ref 0.0–0.1)
Basophils Relative: 0 %
Eosinophils Absolute: 0.1 10*3/uL (ref 0.0–0.5)
Eosinophils Relative: 2 %
HCT: 30.7 % — ABNORMAL LOW (ref 36.0–46.0)
Hemoglobin: 9.9 g/dL — ABNORMAL LOW (ref 12.0–15.0)
Immature Granulocytes: 1 %
Lymphocytes Relative: 18 %
Lymphs Abs: 1 10*3/uL (ref 0.7–4.0)
MCH: 33.2 pg (ref 26.0–34.0)
MCHC: 32.2 g/dL (ref 30.0–36.0)
MCV: 103 fL — ABNORMAL HIGH (ref 80.0–100.0)
Monocytes Absolute: 0.4 10*3/uL (ref 0.1–1.0)
Monocytes Relative: 6 %
Neutro Abs: 4.3 10*3/uL (ref 1.7–7.7)
Neutrophils Relative %: 73 %
Platelets: 72 10*3/uL — ABNORMAL LOW (ref 150–400)
RBC: 2.98 MIL/uL — ABNORMAL LOW (ref 3.87–5.11)
RDW: 13.9 % (ref 11.5–15.5)
WBC: 5.9 10*3/uL (ref 4.0–10.5)
nRBC: 0 % (ref 0.0–0.2)

## 2020-07-10 LAB — LIPASE, BLOOD: Lipase: 25 U/L (ref 11–51)

## 2020-07-10 LAB — MAGNESIUM: Magnesium: 1.7 mg/dL (ref 1.7–2.4)

## 2020-07-10 MED ORDER — GADOBUTROL 1 MMOL/ML IV SOLN
5.0000 mL | Freq: Once | INTRAVENOUS | Status: AC | PRN
  Administered 2020-07-10: 5 mL via INTRAVENOUS

## 2020-07-10 MED ORDER — HYDROMORPHONE HCL 1 MG/ML IJ SOLN
1.0000 mg | INTRAMUSCULAR | Status: DC | PRN
  Administered 2020-07-10 – 2020-07-11 (×4): 1 mg via INTRAVENOUS
  Filled 2020-07-10 (×5): qty 1

## 2020-07-10 MED ORDER — PROMETHAZINE HCL 25 MG/ML IJ SOLN
25.0000 mg | Freq: Four times a day (QID) | INTRAMUSCULAR | Status: DC | PRN
  Administered 2020-07-10 – 2020-07-11 (×3): 25 mg via INTRAVENOUS
  Filled 2020-07-10 (×3): qty 1

## 2020-07-10 MED ORDER — HYDRALAZINE HCL 20 MG/ML IJ SOLN: 10.0000 mg | INTRAMUSCULAR | Status: DC | PRN

## 2020-07-10 NOTE — Consult Note (Addendum)
Referring Provider: [facility] Primary Care Physician:  Patient, No Pcp Per Primary Gastroenterologist:  Dr. Laural Golden  Date of Admission: 07/07/20 Date of Consultation: 07/10/20  Reason for Consultation:  Acute pancreatitis (recurrent), alcoholism, dilated CBD  HPI:  Olivia Lester is a 44 y.o. female with a past medical history of polysubstance abuse/recreational drug abuse, multiple hospitalizations for acute pancreatitis, alcohol abuse, severe alcohol withdrawal including seizures, hepatitis C. She has had multiple past hospitalizations for acute pancreatitis including in 2020 when hepatitis C RNA was checked and found to be positive. Does not appear she followed up for treatment. Not sure she would be a good treatment candidate due to alcoholism and concerns for noncompliance.  Most recently she was discharged the first week of December after hospitalization at a [facility] for 3 days for alcohol induced pancreatitis. Admission H&P indicates binge drinking after discharge, but the patient states she was drinking when she got home but not as much. Typically drinks a fifth of liquor a day per her admission, but has not had any alcohol in 1 week. In the emergency department. In the emergency room she had hyponatremia with a sodium of 128, hypokalemia with a potassium of 2.8. Transaminitis with AST/ALT of 76/52, normal alkaline phosphatase of 105, normal bilirubin at 0.8. CBC with no sign of blood loss. Leukocytosis with white blood cell count 13.8. Negative ethanol level. Lipase mildly elevated at 106. It does not appear she was tested for COVID-19 this admission because of recent positive results at [facility] about 1-1/2 weeks ago.  CT of the abdomen and pelvis was completed that found mild peripancreatic fat stranding is could be seen with acute pancreatitis but no appreciable pancreatic parenchymal edema or ductal dilation. Also noted hepatic steatosis without changes of cirrhosis. A  follow-up right upper quadrant ultrasound found no cholelithiasis, again demonstrated increased prominence of CBD at 8.6 mm with no obvious stone or obstruction.  During her recent pancreatitis hospitalization she was Covid positive but states she was treated and has since had a vaccine. However, emergency room notes indicate she has NOT had a COVID-19 vaccine.   Previous EGD 10/14/2013 which found a 5 mm gastric ulcer at the antrum and gastroduodenitis likely secondary to NSAID therapy. Recommended PPI, H. pylori serologies (resulted negative).  Apparently the patient has been undergoing standard acute pancreatitis treatment but has had no improvement. Labs today show decline in hemoglobin to 9.9 but an H&H was rechecked and found normal hemoglobin at 14.0 (this morning CBC might have been an error). CMP with persistent transaminitis with AST/ALT at 87/59, alk phos and bilirubin remain normal. Lipase is normalized as of 2 days ago and today is 25.  Today she states she is having worsening pain. She did have some nausea and vomiting this morning but this is improved with Zofran and is now only nauseous. She notes some mild trace hematemesis. She states about a month ago she also started having some blood in her stool, not sure how much. She has not discussed this with anyone, does not have a primary care. No history of colonoscopy. States she had a recent fall and thought her pain was from possibly some broken ribs. Her pain is right upper quadrant.  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   Pancreatitis, acute    Panic attack    Pneumonia    Polysubstance abuse (HCC)    PONV (postoperative nausea and vomiting)    Seizures (HCC)     Past Surgical History:  Procedure Laterality Date   BACK SURGERY      BREAST SURGERY     ESOPHAGOGASTRODUODENOSCOPY (EGD) WITH PROPOFOL N/A 10/14/2013   Procedure: ESOPHAGOGASTRODUODENOSCOPY (EGD) WITH PROPOFOL;  Surgeon: Rogene Houston, MD;  Location: [facility];  Service: Endoscopy;  Laterality: N/A;   ORIF TOE FRACTURE Right 08/08/2018   Procedure: RIGHT FOOT INTERNAL FIXATION 5TH METATARSAL;  Surgeon: Newt Minion, MD;  Location: [place];  Service: Orthopedics;  Laterality: Right;   TONSILLECTOMY      Prior to Admission medications   Medication Sig Start Date End Date Taking? Authorizing Provider  acetaminophen (TYLENOL) 325 MG tablet Take 2 tablets (650 mg total) by mouth every 6 (six) hours as needed for mild pain or headache (or Fever >/= 101). 04/23/20  Yes Barton Dubois, MD  amLODipine (NORVASC) 5 MG tablet Take 1 tablet (5 mg total) by mouth daily. 10/28/19  Yes Emokpae, Courage, MD  ELIQUIS 2.5 MG TABS tablet Take 2.5 mg by mouth 2 (two) times daily. Last filled 07-02-20 07/02/20  Yes [provider]  folic acid (FOLVITE) 1 MG tablet Take 1 tablet (1 mg total) by mouth daily. 10/29/19  Yes Emokpae, Courage, MD  pantoprazole (PROTONIX) 40 MG tablet Take 1 tablet (40 mg total) by mouth daily. 06/03/20 09/01/20 Yes Paduchowski, Lennette Bihari, MD  potassium chloride SA (KLOR-CON) 20 MEQ tablet Take 20 mEq by mouth 2 (two) times daily. 3 day supply last filled 06-25-20 06/25/20  Yes [provider]  lipase/protease/amylase 24000-76000 units CPEP Take 1 capsule (24,000 Units total) by mouth 3 (three) times daily before meals. Patient not taking: No sig reported 10/29/19   Roxan Hockey, MD  Multiple Vitamin (MULTIVITAMIN WITH MINERALS) TABS tablet Take 1 tablet by mouth every other day.  Patient not taking: No sig reported    [provider]  promethazine (PHENERGAN) 25 MG tablet Take 1 tablet (25 mg total) by mouth every 6 (six) hours as needed for nausea or vomiting. Patient not taking: Reported on 07/07/2020 06/03/20   Harvest Dark, MD   thiamine 100 MG tablet Take 1 tablet (100 mg total) by mouth daily. Patient not taking: No sig reported 10/29/19   Roxan Hockey, MD    Current Facility-Administered Medications  Medication Dose Route Frequency Provider Last Rate Last Admin   0.9 %  sodium chloride infusion   Intravenous Continuous Heath Lark D, DO 150 mL/hr at 07/10/20 0121 New Bag at 07/10/20 0121   chlordiazePOXIDE (LIBRIUM) capsule 10 mg  10 mg Oral TID Wynetta Emery, Clanford L, MD   10 mg at 07/10/20 0823   enoxaparin (LOVENOX) injection 40 mg  40 mg Subcutaneous Q24H Johnson, Clanford L, MD   40 mg at 90/21/11 5520   folic acid (FOLVITE) tablet 1 mg  1 mg Oral Daily Johnson, Clanford L, MD   1 mg at 07/10/20 8022   HYDROmorphone (DILAUDID) injection 1 mg  1 mg Intravenous Q3H PRN Manuella Ghazi, Pratik D, DO       LORazepam (ATIVAN) injection 0-4 mg  0-4 mg Intravenous Q12H Johnson, Clanford L, MD   2 mg at 07/10/20 0833   metoprolol tartrate (LOPRESSOR) injection 5 mg  5 mg Intravenous Q6H PRN Murlean Iba, MD  multivitamin with minerals tablet 1 tablet  1 tablet Oral Daily Johnson, Clanford L, MD   1 tablet at 07/10/20 6045   nicotine (NICODERM CQ - dosed in mg/24 hours) patch 21 mg  21 mg Transdermal Daily Johnson, Clanford L, MD   21 mg at 07/10/20 0825   ondansetron (ZOFRAN) tablet 4 mg  4 mg Oral Q6H PRN Wynetta Emery, Clanford L, MD   4 mg at 07/10/20 0417   Or   ondansetron (ZOFRAN) injection 4 mg  4 mg Intravenous Q6H PRN Wynetta Emery, Clanford L, MD   4 mg at 07/09/20 1549   oxyCODONE (Oxy IR/ROXICODONE) immediate release tablet 5 mg  5 mg Oral Q4H PRN Wynetta Emery, Clanford L, MD   5 mg at 07/10/20 4098   pantoprazole (PROTONIX) injection 40 mg  40 mg Intravenous Q24H Johnson, Clanford L, MD   40 mg at 07/10/20 1191   thiamine tablet 100 mg  100 mg Oral Daily Johnson, Clanford L, MD   100 mg at 07/10/20 4782   Or   thiamine (B-1) injection 100 mg  100 mg Intravenous Daily Wynetta Emery, Clanford L, MD         Allergies as of 07/06/2020 - Review Complete 07/06/2020  Allergen Reaction Noted   Toradol [ketorolac tromethamine] Other (See Comments) 09/25/2013   Asa [aspirin] Rash 05/31/2019    Family History  Problem Relation Age of Onset   Cancer Mother 65       ovarian    Hypertension Mother    Stroke Mother    Early death Father    Alcohol abuse Father     Social History   Socioeconomic History   Marital status: Divorced    Spouse name: Not on file   Number of children: Not on file   Years of education: Not on file   Highest education level: Not on file  Occupational History   Not on file  Tobacco Use   Smoking status: Current Every Day Smoker    Packs/day: 1.00    Years: 20.00    Pack years: 20.00    Types: Cigarettes   Smokeless tobacco: Never Used  Vaping Use   Vaping Use: Never used  Substance and Sexual Activity   Alcohol use: Yes    Alcohol/week: 18.0 standard drinks    Types: 18 Cans of beer per week    Comment: 1/5 -1/2 gallon of liqour daily or 18 beers daily   Drug use: Yes    Types: Marijuana, Cocaine, Methamphetamines    Comment: denies current cocaine/meth...current mj   Sexual activity: Not Currently    Birth control/protection: None  Other Topics Concern   Not on file  Social History Narrative   Not on file   Social Determinants of Health   Financial Resource Strain: Not on file  Food Insecurity: Not on file  Transportation Needs: Not on file  Physical Activity: Not on file  Stress: Not on file  Social Connections: Not on file  Intimate Partner Violence: Not on file    Review of Systems: General: Negative for anorexia, fever, chills, fatigue, weakness. ENT: Negative for hoarseness, difficulty swallowing. CV: Negative for chest pain, angina, palpitations, peripheral edema.  Respiratory: Negative for dyspnea at rest, cough, sputum, wheezing.  GI: See history of present illness. Endo: Negative for unusual weight change.   Heme: Negative for bruising or bleeding. Allergy: Negative for rash or hives.  Physical Exam: Vital signs in last 24 hours: Temp:  [97.9 F (36.6 C)-98.2 F (36.8 C)]  98.1 F (36.7 C) (12/17 0402) Pulse Rate:  [63-105] 105 (12/17 0821) Resp:  [18-19] 19 (12/17 0402) BP: (147-161)/(99-121) 161/121 (12/17 0821) SpO2:  [100 %] 100 % (12/17 0402) Last BM Date: 07/07/20 General:   Alert,  Well-developed, well-nourished, pleasant and cooperative in NAD. Appears in pain.  Head:  Normocephalic and atraumatic. Eyes:  Sclera clear, no icterus. Conjunctiva pink. Ears:  Normal auditory acuity. Neck:  Supple; no masses or thyromegaly. Lungs:  Clear throughout to auscultation. No wheezes, crackles, or rhonchi. No acute distress. Heart:  Regular rate and rhythm; no murmurs, clicks, rubs,  or gallops. Abdomen:  Soft and nondistended. Significant abdominal tenderness with light palpation to epigastrum and RUQ. Unable to assess for masses, hepatosplenomegaly or hernias given pain. Normal bowel sounds, without guarding, and without rebound.   Rectal:  Deferred.   Msk:  Symmetrical without gross deformities. Pulses:  Normal bilateral DP pulses noted. Extremities:  Without clubbing or edema. Neurologic:  Alert and  oriented x4;  grossly normal neurologically. Psych:  Alert and cooperative. Normal mood and affect.  Intake/Output from previous day: 12/16 0701 - 12/17 0700 In: 1360 [P.O.:360; I.V.:1000] Out: -  Intake/Output this shift: No intake/output data recorded.  Lab Results: Recent Labs    07/08/20 0441 07/09/20 0548 07/10/20 0428  WBC 6.0 4.7 5.9  HGB 13.6 13.1 9.9*  HCT 40.2 38.8 30.7*  PLT 101* 85* 72*   BMET Recent Labs    07/08/20 0441 07/09/20 0548 07/10/20 0751  NA 132* 128* 135  K 3.1* 3.8 3.6  CL 96* 96* 103  CO2 26 24 24   GLUCOSE 83 94 90  BUN 9 9 5*  CREATININE 0.35* 0.36* 0.37*  CALCIUM 8.6* 8.6* 8.7*   LFT Recent Labs    07/08/20 0441 07/09/20 0548  07/10/20 0751  PROT 6.2* 5.9* 6.2*  ALBUMIN 3.2* 3.1* 3.2*  AST 101* 61* 87*  ALT 52* 48* 59*  ALKPHOS 73 66 70  BILITOT 1.0 1.0 0.7   PT/INR No results for input(s): LABPROT, INR in the last 72 hours. Hepatitis Panel No results for input(s): HEPBSAG, HCVAB, HEPAIGM, HEPBIGM in the last 72 hours. C-Diff No results for input(s): CDIFFTOX in the last 72 hours.  Studies/Results: CT ABDOMEN PELVIS W CONTRAST  Result Date: 07/09/2020 CLINICAL DATA:  Inpatient. Epigastric abdominal pain radiating to the back with nausea and vomiting for 3 days. History of pancreatitis and hepatitis C. EXAM: CT ABDOMEN AND PELVIS WITH CONTRAST TECHNIQUE: Multidetector CT imaging of the abdomen and pelvis was performed using the standard protocol following bolus administration of intravenous contrast. CONTRAST:  14m OMNIPAQUE IOHEXOL 300 MG/ML  SOLN COMPARISON:  06/03/2020 CT abdomen/pelvis. FINDINGS: Lower chest: No significant pulmonary nodules or acute consolidative airspace disease. Hepatobiliary: Diffuse hepatic steatosis. Normal liver size with no definite liver surface irregularity. No liver masses. Normal gallbladder with no radiopaque cholelithiasis. No intrahepatic biliary ductal dilatation. Mildly dilated common bile duct (8 mm diameter), previously 5 mm, increased. No radiopaque choledocholithiasis. Pancreas: Normal, with no mass or duct dilation. Spleen: Normal size. No mass. Adrenals/Urinary Tract: Normal adrenals. Normal kidneys with no hydronephrosis and no renal mass. Normal bladder. Stomach/Bowel: Normal non-distended stomach. Normal caliber small bowel with no small bowel wall thickening. Normal appendix. Normal large bowel with no diverticulosis, large bowel wall thickening or pericolonic fat stranding. Vascular/Lymphatic: Atherosclerotic nonaneurysmal abdominal aorta. Patent portal, splenic, hepatic and renal veins. No pathologically enlarged lymph nodes in the abdomen or pelvis. 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 No left adnexal mass. Grossly normal uterus. Other: No pneumoperitoneum, ascites or focal fluid collection. Subcutaneous gas in ventral left lower abdominal wall is presumably related to subcutaneous injection medication. Musculoskeletal: No aggressive appearing focal osseous lesions. Status post bilateral posterior spinal fusion in the lower lumbar spine. IMPRESSION: 1. Mildly dilated common bile duct (8 mm diameter), increased from recent prior CT, without intrahepatic biliary ductal dilatation. No radiopaque choledocholithiasis. Recommend correlation with serum bilirubin levels. If there is clinical concern for acute biliary pathology, consider further evaluation with MRI abdomen with MRCP without and with IV contrast. 2. No evidence of bowel obstruction or acute bowel inflammation. Normal appendix. 3. Diffuse hepatic steatosis. 4. 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 5. Aortic Atherosclerosis (ICD10-I70.0). Electronically Signed   By: Ilona Sorrel M.D.   On: 07/09/2020 12:25   US Abdomen Limited RUQ (LIVER/GB)  Result Date: 07/09/2020 CLINICAL DATA:  Acute pancreatitis. EXAM: ULTRASOUND ABDOMEN LIMITED RIGHT UPPER QUADRANT COMPARISON:  CT 07/09/2020. FINDINGS: Gallbladder: No gallstones or wall thickening visualized. Positive Murphy's sign by ultrasound Common bile duct: Diameter: 8.6 no intrahepatic biliary distention. Liver: Liver appears enlarged. Hepatomegaly cannot be excluded. Increased hepatic echogenicity consistent fatty infiltration or hepatocellular disease. No focal hepatic abnormality identified. Portal vein is patent on color Doppler imaging with normal direction of blood flow towards the liver. Other: None. IMPRESSION: 1. No gallstones. No gallbladder wall thickening. Common bile duct is prominent at 8.6 mm. Positive Murphy sign by ultrasound. 2. The liver appears enlarged. Increased  hepatic echogenicity consistent with fatty infiltration or hepatocellular disease. Electronically Signed   By: Marcello Moores  Register   On: 07/09/2020 16:23    Impression: 44 year old female with a history of polysubstance abuse, alcohol abuse, multiple admissions for acute pancreatitis presents again today with symptoms consistent with acute pancreatitis. Her lipase was only minimally elevated at admission at 106. Is since normalized. Her pain is gotten progressively worse, per the patient, with no noted improvement by the hospitalist and requested GI evaluation. CT of the abdomen and pelvis showed no significant findings for pancreatitis, hepatic steatosis, mildly dilated common bile duct of 8 mm (previously 5 mm) which is now increased. Follow-up right upper quadrant ultrasound with no gallstones, positive Murphy sign, prominent CBD at 8.6 mm.  Acute abdominal pain with dilated CBD ? Pancreatitis: She was admitted for acute pancreatitis and her symptoms seem consistent with previous pancreatitis admissions. However, her lipase was not impressive and neither was her CT imaging. She did have increased diameter CBD dilation confirmed with right upper quadrant ultrasound. Her pain is quite significant today, she states it is mostly in her right upper quadrant. Some nausea and vomiting as well. No obvious cholelithiasis on ultrasound. Query gallbladder involvement or recently passed stone.  Rectal bleeding: The patient states that she has had rectal bleeding for the past month, unsure of the amount. However her hemoglobin is normal/stable. This should likely be evaluated as an outpatient colonoscopy if she is compliant with follow-up (previously was a no-show to follow-up GI visits).  Hematemesis: Again likely not significant given hemodynamic stability and normal hemoglobin. Likely the result of frequent vomiting with alcoholism and abdominal pain/possible pancreatitis. Continue to monitor for any recurrent  hematemesis.  Elevated LFTs: Transaminitis likely due to chronic alcoholism. Cannot rule out component of choledocholithiasis, CBD obstruction, or recently passed stone. However, her LFTs are otherwise not very impressive for this. Continue to monitor and trend LFTs.  Plan: 1. MRI/MRCP  2. Monitor for any hematemesis or rectal bleeding 3. Continued pain and nausea management per hospitalist 4. Remain n.p.o. for now 5. Encourage patient masking at all times 6. Supportive measures 7. Further recommendations to follow   Thank you for allowing [place] to participate in the care of Cando, DNP, AGNP-C Adult & Gerontological Nurse Practitioner [facility]   LOS: 3 days     07/10/2020, 9:08 AM

## 2020-07-10 NOTE — Progress Notes (Signed)
PROGRESS NOTE    Olivia Lester  GGY:[id number] DOB: 1976-02-24 DOA: 07/07/2020 PCP: Patient, No Pcp Per   Brief Narrative:  Per HPI: Olivia Fryeis a 44 y.o.femalewith medical history significantforrecreational drug abuse, polysubstance abuse, multiple hospitalizations for acute pancreatitis, alcohol abuse, history of severe alcohol withdrawal including seizures, presents to the emergency department complaining of abdominal pain in the epigastrium similar to prior episodes of pancreatitis. She was recently discharged from a different hospital approximately 1 week ago for similar admission for pancreatitis. The patient reports that she started binging alcohol after arriving home. She now presents with similar symptoms concerning for acute pancreatitis. She also reports nausea and vomiting associated with the abdominal pain and retching. She denies chest pain or shortness of breath. She denies fever and chills.  -Patient has been admitted with acute pancreatitis in the setting of ongoing alcohol abuse. She continues to have ongoing pain and requests Dilaudid quite frequently.  She is noted to have dilated common bile duct on imaging 12/16.  GI consulted on 12/17 to assist with management as she has ongoing pain without clinical progression.   Assessment & Plan:   Active Problems:   Alcoholism ([place])   Thrombocytopenia (HCC)   Transaminitis   Tobacco dependence   Hepatitis C   Alcohol abuse   Hypokalemia   Essential hypertension   Polysubstance abuse ([place])   Hypomagnesemia   Alcoholic hepatitis without ascites--Severe hepatic steatosis   Pancreatitis, recurrent   Fatty liver, alcoholic/Severe hepatic steatosis   Hyponatremia   Recurrent pancreatitis   Acute on chronic pancreatitis (HCC)   Alcohol withdrawal seizure ([place])   Leukocytosis   Acute pancreatitis   1. Acute pancreatitis secondary to alcohol abuse with noted common bile duct dilation -patient presents with epigastric  pain that is radiating into the back associated with severe nausea and vomiting and reports recent alcohol abuse. She presents with an elevated lipase level. Admit to [facility] for further monitoring. Maintain n.p.o. status except sips ice chips and meds.'s. Continue IV fluids as ordered. IV pain medication and nausea medications ordered. Follow CMP and lipase level.She has recently had multiple CTs in the last several months.   Repeat CT scan performed today with no acute findings of pancreatitis, however there is some mild common bile duct dilation without any significant cholelithiasis.  Bilirubin levels have not been elevated.  She continues to have pain and will remain n.p.o. and on IV fluids.  Increased pain management with increasing doses of IV Dilaudid.  Appreciate GI evaluation with plans for MRCP today. 2. Leukocytosis suspect reactive to acute pancreatitis which we are treating supportively. This has resolved.  Plan to follow CBC. 3. Noted history of rectal bleeding and hematemesis.  Hemoglobin continues to remain stable.  Can likely follow-up with GI outpatient for this unless significant symptomatology noted. 4. Polysubstance abuse-consult TOC servicefor counseling and treatment options. 5. Thrombocytopenia-this is chronic associated with chronic chronic alcohol abuse and liver injury. 6. Chronic alcohol abuse-patient has history of severe alcohol withdrawal including seizures. She has been started on Librium 10 mg 3 times daily in addition to a CIWA protocol with lorazepam. Continue to follow closely. She is also been supplemented with thiamine, multivitamins and folate cholic acid. 7. Hyponatremia-this is chronic secondary to her chronic alcoholism and chronic liver disease. 8. Transaminitis-secondary to chronic alcohol abuseand reported history of treated HCV.follow LFTs closely.  These are currently stable without bilirubin elevation. 9. Tobacco abuse-nicotine patch ordered,  counseling on cessation performed at bedside. 10. Essential hypertension  resume home blood pressure lowering medications and keep on IV hydralazine as needed for significant elevations.  Likely elevated on account of pain.  DVT prophylaxis:Lovenox Code Status:Full Family Communication:None at bedside Disposition Plan: Status is: Inpatient  Remains inpatient appropriate because:IV treatments appropriate due to intensity of illness or inability to take PO and Inpatient level of care appropriate due to severity of illness   Dispo: The patient is from:Home Anticipated d/c is OI:[facility]TIW Anticipated d/c date is: 2-3 days Patient currently is not medically stable to d/c.Patient requires ongoing IV fluid and pain management.  GI consulted with plans for MRCP.   Consultants:  GI  Procedures:  See below  Antimicrobials:   None   Subjective: Patient seen and evaluated today and appears tearful and in significant amounts of pain.  Objective: Vitals:   07/09/20 2146 07/10/20 0402 07/10/20 0821 07/10/20 1100  BP: (!) 161/100 (!) 147/106 (!) 161/121 (!) 142/103  Pulse: 63 84 (!) 105   Resp:  19    Temp:  98.1 F (36.7 C)    TempSrc:  Oral    SpO2:  100%    Weight:      Height:        Intake/Output Summary (Last 24 hours) at 07/10/2020 1238 Last data filed at 07/09/2020 1959 Gross per 24 hour  Intake 1360 ml  Output --  Net 1360 ml   Filed Weights   07/06/20 2054  Weight: 47.6 kg    Examination:  General exam: Appears to be in significant distress Respiratory system: Clear to auscultation. Respiratory effort normal. Cardiovascular system: S1 & S2 heard, RRR Gastrointestinal system: Abdomen guarding noted.  Tenderness to palpation. Central nervous system: Alert and awake Extremities: No edema Skin: No rashes, lesions or ulcers Psychiatry: Continues to be in distress    Data Reviewed: I have personally  reviewed following labs and imaging studies  CBC: Recent Labs  Lab 07/06/20 2255 07/07/20 0813 07/08/20 0441 07/09/20 0548 07/10/20 0428 07/10/20 0751  WBC 13.8* 8.4 6.0 4.7 5.9  --   NEUTROABS  --   --  3.6 2.6 4.3  --   HGB 16.7* 15.6* 13.6 13.1 9.9* 14.0  HCT 47.6* 46.9* 40.2 38.8 30.7* 41.6  MCV 97.5 101.1* 99.5 100.8* 103.0*  --   PLT 191 122* 101* 85* 72*  --    Basic Metabolic Panel: Recent Labs  Lab 07/06/20 2255 07/07/20 0548 07/07/20 0813 07/08/20 0441 07/09/20 0548 07/10/20 0751  NA 128*  --   --  132* 128* 135  K 2.8*  --   --  3.1* 3.8 3.6  CL 85*  --   --  96* 96* 103  CO2 29  --   --  26 24 24   GLUCOSE 93  --   --  83 94 90  BUN 9  --   --  9 9 5*  CREATININE 0.40*  --  0.39* 0.35* 0.36* 0.37*  CALCIUM 9.8  --   --  8.6* 8.6* 8.7*  MG  --  1.6*  --  1.7 1.5* 1.7   GFR: Estimated Creatinine Clearance: 67.4 mL/min (A) (by C-G formula based on SCr of 0.37 mg/dL (L)). Liver Function Tests: Recent Labs  Lab 07/06/20 2255 07/08/20 0441 07/09/20 0548 07/10/20 0751  AST 76* 101* 61* 87*  ALT 52* 52* 48* 59*  ALKPHOS 105 73 66 70  BILITOT 0.8 1.0 1.0 0.7  PROT 8.9* 6.2* 5.9* 6.2*  ALBUMIN 4.6 3.2* 3.1* 3.2*   Recent  Labs  Lab 07/06/20 2255 07/08/20 0441 07/09/20 0548 07/10/20 0751  LIPASE 106* 33 28 25   No results for input(s): AMMONIA in the last 168 hours. Coagulation Profile: No results for input(s): INR, PROTIME in the last 168 hours. Cardiac Enzymes: No results for input(s): CKTOTAL, CKMB, CKMBINDEX, TROPONINI in the last 168 hours. BNP (last 3 results) No results for input(s): PROBNP in the last 8760 hours. HbA1C: No results for input(s): HGBA1C in the last 72 hours. CBG: No results for input(s): GLUCAP in the last 168 hours. Lipid Profile: No results for input(s): CHOL, HDL, LDLCALC, TRIG, CHOLHDL, LDLDIRECT in the last 72 hours. Thyroid Function Tests: No results for input(s): TSH, T4TOTAL, FREET4, T3FREE, THYROIDAB in the last  72 hours. Anemia Panel: No results for input(s): VITAMINB12, FOLATE, FERRITIN, TIBC, IRON, RETICCTPCT in the last 72 hours. Sepsis Labs: No results for input(s): PROCALCITON, LATICACIDVEN in the last 168 hours.  No results found for this or any previous visit (from the past 240 hour(s)).       Radiology Studies: CT ABDOMEN PELVIS W CONTRAST  Result Date: 07/09/2020 CLINICAL DATA:  Inpatient. Epigastric abdominal pain radiating to the back with nausea and vomiting for 3 days. History of pancreatitis and hepatitis C. EXAM: CT ABDOMEN AND PELVIS WITH CONTRAST TECHNIQUE: Multidetector CT imaging of the abdomen and pelvis was performed using the standard protocol following bolus administration of intravenous contrast. CONTRAST:  64mL OMNIPAQUE IOHEXOL 300 MG/ML  SOLN COMPARISON:  06/03/2020 CT abdomen/pelvis. FINDINGS: Lower chest: No significant pulmonary nodules or acute consolidative airspace disease. Hepatobiliary: Diffuse hepatic steatosis. Normal liver size with no definite liver surface irregularity. No liver masses. Normal gallbladder with no radiopaque cholelithiasis. No intrahepatic biliary ductal dilatation. Mildly dilated common bile duct (8 mm diameter), previously 5 mm, increased. No radiopaque choledocholithiasis. Pancreas: Normal, with no mass or duct dilation. Spleen: Normal size. No mass. Adrenals/Urinary Tract: Normal adrenals. Normal kidneys with no hydronephrosis and no renal mass. Normal bladder. Stomach/Bowel: Normal non-distended stomach. Normal caliber small bowel with no small bowel wall thickening. Normal appendix. Normal large bowel with no diverticulosis, large bowel wall thickening or pericolonic fat stranding. Vascular/Lymphatic: Atherosclerotic nonaneurysmal abdominal aorta. Patent portal, splenic, hepatic and renal veins. No pathologically enlarged lymph nodes in the abdomen or pelvis. 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 No left adnexal mass. Grossly normal uterus. Other: No pneumoperitoneum, ascites or focal fluid collection. Subcutaneous gas in ventral left lower abdominal wall is presumably related to subcutaneous injection medication. Musculoskeletal: No aggressive appearing focal osseous lesions. Status post bilateral posterior spinal fusion in the lower lumbar spine. IMPRESSION: 1. Mildly dilated common bile duct (8 mm diameter), increased from recent prior CT, without intrahepatic biliary ductal dilatation. No radiopaque choledocholithiasis. Recommend correlation with serum bilirubin levels. If there is clinical concern for acute biliary pathology, consider further evaluation with MRI abdomen with MRCP without and with IV contrast. 2. No evidence of bowel obstruction or acute bowel inflammation. Normal appendix. 3. Diffuse hepatic steatosis. 4. 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 5. Aortic Atherosclerosis (ICD10-I70.0). Electronically Signed   By: Ilona Sorrel M.D.   On: 07/09/2020 12:25   US Abdomen Limited RUQ (LIVER/GB)  Result Date: 07/09/2020 CLINICAL DATA:  Acute pancreatitis. EXAM: ULTRASOUND ABDOMEN LIMITED RIGHT UPPER QUADRANT COMPARISON:  CT 07/09/2020. FINDINGS: Gallbladder: No gallstones or wall thickening visualized. Positive Murphy's sign by ultrasound Common bile duct: Diameter: 8.6 no intrahepatic biliary distention. Liver: Liver  appears enlarged. Hepatomegaly cannot be excluded. Increased hepatic echogenicity consistent fatty infiltration or hepatocellular disease. No focal hepatic abnormality identified. Portal vein is patent on color Doppler imaging with normal direction of blood flow towards the liver. Other: None. IMPRESSION: 1. No gallstones. No gallbladder wall thickening. Common bile duct is prominent at 8.6 mm. Positive Murphy sign by ultrasound. 2. The liver appears enlarged. Increased hepatic echogenicity consistent with fatty infiltration or hepatocellular  disease. Electronically Signed   By: Marcello Moores  Register   On: 07/09/2020 16:23        Scheduled Meds:  chlordiazePOXIDE  10 mg Oral TID   enoxaparin (LOVENOX) injection  40 mg Subcutaneous R83E   folic acid  1 mg Oral Daily   LORazepam  0-4 mg Intravenous Q12H   multivitamin with minerals  1 tablet Oral Daily   nicotine  21 mg Transdermal Daily   pantoprazole (PROTONIX) IV  40 mg Intravenous Q24H   thiamine  100 mg Oral Daily   Or   thiamine  100 mg Intravenous Daily   Continuous Infusions:  sodium chloride 150 mL/hr at 07/10/20 0121     LOS: 3 days    Time spent: 35 minutes    Olivia Lester Darleen Crocker, DO [facility]  If 7PM-7AM, please contact night-coverage [email or web address] 07/10/2020, 12:38 PM

## 2020-07-11 LAB — COMPREHENSIVE METABOLIC PANEL
ALT: 72 U/L — ABNORMAL HIGH (ref 0–44)
AST: 140 U/L — ABNORMAL HIGH (ref 15–41)
Albumin: 3.2 g/dL — ABNORMAL LOW (ref 3.5–5.0)
Alkaline Phosphatase: 66 U/L (ref 38–126)
Anion gap: 10 (ref 5–15)
BUN: <5 mg/dL — ABNORMAL LOW (ref 6–20)
CO2: 20 mmol/L — ABNORMAL LOW (ref 22–32)
Calcium: 8.5 mg/dL — ABNORMAL LOW (ref 8.9–10.3)
Chloride: 101 mmol/L (ref 98–111)
Creatinine, Ser: 0.44 mg/dL (ref 0.44–1.00)
GFR, Estimated: >60 mL/min (ref >60)
Glucose, Bld: 84 mg/dL (ref 70–99)
Potassium: 3.7 mmol/L (ref 3.5–5.1)
Sodium: 131 mmol/L — ABNORMAL LOW (ref 135–145)
Total Bilirubin: 1 mg/dL (ref 0.3–1.2)
Total Protein: 6.2 g/dL — ABNORMAL LOW (ref 6.5–8.1)

## 2020-07-11 LAB — CBC WITH DIFFERENTIAL/PLATELET
Abs Immature Granulocytes: 0.04 10*3/uL (ref 0.00–0.07)
Basophils Absolute: 0 10*3/uL (ref 0.0–0.1)
Basophils Relative: 0 %
Eosinophils Absolute: 0.1 10*3/uL (ref 0.0–0.5)
Eosinophils Relative: 1 %
HCT: 38.5 % (ref 36.0–46.0)
Hemoglobin: 13.1 g/dL (ref 12.0–15.0)
Immature Granulocytes: 1 %
Lymphocytes Relative: 17 %
Lymphs Abs: 0.8 10*3/uL (ref 0.7–4.0)
MCH: 33.8 pg (ref 26.0–34.0)
MCHC: 34 g/dL (ref 30.0–36.0)
MCV: 99.2 fL (ref 80.0–100.0)
Monocytes Absolute: 0.5 10*3/uL (ref 0.1–1.0)
Monocytes Relative: 12 %
Neutro Abs: 3.2 10*3/uL (ref 1.7–7.7)
Neutrophils Relative %: 69 %
Platelets: 55 10*3/uL — ABNORMAL LOW (ref 150–400)
RBC: 3.88 MIL/uL (ref 3.87–5.11)
RDW: 14 % (ref 11.5–15.5)
WBC: 4.7 10*3/uL (ref 4.0–10.5)
nRBC: 0 % (ref 0.0–0.2)

## 2020-07-11 LAB — MAGNESIUM: Magnesium: 1.6 mg/dL — ABNORMAL LOW (ref 1.7–2.4)

## 2020-07-11 MED ORDER — CHLORDIAZEPOXIDE HCL 10 MG PO CAPS: ORAL_CAPSULE | ORAL | 0 refills | Status: AC

## 2020-07-11 MED ORDER — OXYCODONE HCL 10 MG PO TABS
10.0000 mg | ORAL_TABLET | Freq: Four times a day (QID) | ORAL | 0 refills | Status: AC | PRN
Start: 2020-07-11 — End: 2020-07-18

## 2020-07-11 MED ORDER — LIDOCAINE 5 % EX PTCH: 1.0000 | MEDICATED_PATCH | CUTANEOUS | 0 refills | Status: DC

## 2020-07-11 MED ORDER — METHOCARBAMOL 500 MG PO TABS: 500.0000 mg | ORAL_TABLET | Freq: Three times a day (TID) | ORAL | 0 refills | Status: DC | PRN

## 2020-07-11 MED ORDER — LIDOCAINE 5 % EX PTCH
1.0000 | MEDICATED_PATCH | CUTANEOUS | Status: DC
  Administered 2020-07-11: 09:00:00 1 via TRANSDERMAL
  Filled 2020-07-11: qty 1

## 2020-07-11 MED ORDER — OXYCODONE HCL 5 MG PO TABS
10.0000 mg | ORAL_TABLET | ORAL | Status: DC | PRN
  Administered 2020-07-11: 10 mg via ORAL
  Filled 2020-07-11: qty 2

## 2020-07-11 MED ORDER — METHOCARBAMOL 500 MG PO TABS
500.0000 mg | ORAL_TABLET | Freq: Three times a day (TID) | ORAL | Status: DC
  Administered 2020-07-11: 09:00:00 500 mg via ORAL
  Filled 2020-07-11: qty 1

## 2020-07-11 MED ORDER — ACETAMINOPHEN 500 MG PO TABS: 1000.0000 mg | ORAL_TABLET | Freq: Four times a day (QID) | ORAL | 0 refills | Status: AC

## 2020-07-11 MED ORDER — ACETAMINOPHEN 500 MG PO TABS
1000.0000 mg | ORAL_TABLET | Freq: Four times a day (QID) | ORAL | Status: DC
  Administered 2020-07-11: 09:00:00 1000 mg via ORAL
  Filled 2020-07-11: qty 2

## 2020-07-11 MED ORDER — THIAMINE HCL 100 MG PO TABS: 100.0000 mg | ORAL_TABLET | Freq: Every day | ORAL | 0 refills | Status: AC

## 2020-07-11 NOTE — Progress Notes (Signed)
Nsg Discharge Note  Admit Date:  07/07/2020 Discharge date: 07/11/2020   Brianca Fortenberry to be D/C'd Home per MD order.  AVS completed.  Copy for chart, and copy for patient signed, and dated. Patient/caregiver able to verbalize understanding. IV removed no complications. Discharge paper work given and reviewed with patient. Patient was stable.   Discharge Medication: Allergies as of 07/11/2020      Reactions   Toradol [ketorolac Tromethamine] Other (See Comments)   Patient advised not to take due to ulcers   Asa [aspirin] Rash      Medication List    TAKE these medications   acetaminophen 500 MG tablet Commonly known as: TYLENOL Take 2 tablets (1,000 mg total) by mouth every 6 (six) hours for 7 days. What changed:   medication strength  how much to take  when to take this  reasons to take this   amLODipine 5 MG tablet Commonly known as: NORVASC Take 1 tablet (5 mg total) by mouth daily.   chlordiazePOXIDE 10 MG capsule Commonly known as: LIBRIUM Take 1 capsule (10 mg total) by mouth 2 (two) times daily for 3 days, THEN 1 capsule (10 mg total) daily for 3 days. Start taking on: July 11, 2020   Eliquis 2.5 MG Tabs tablet Generic drug: apixaban Take 2.5 mg by mouth 2 (two) times daily. Last filled 02-58-52   folic acid 1 MG tablet Commonly known as: FOLVITE Take 1 tablet (1 mg total) by mouth daily.   lidocaine 5 % Commonly known as: LIDODERM Place 1 patch onto the skin daily. Remove & Discard patch within 12 hours or as directed by MD Start taking on: July 12, 2020   methocarbamol 500 MG tablet Commonly known as: ROBAXIN Take 1 tablet (500 mg total) by mouth every 8 (eight) hours as needed for muscle spasms.   multivitamin with minerals Tabs tablet Take 1 tablet by mouth every other day.   Oxycodone HCl 10 MG Tabs Take 1 tablet (10 mg total) by mouth every 6 (six) hours as needed for up to 7 days for breakthrough pain.   Pancrelipase (Lip-Prot-Amyl)  24000-76000 units Cpep Take 1 capsule (24,000 Units total) by mouth 3 (three) times daily before meals.   pantoprazole 40 MG tablet Commonly known as: Protonix Take 1 tablet (40 mg total) by mouth daily.   potassium chloride SA 20 MEQ tablet Commonly known as: KLOR-CON Take 20 mEq by mouth 2 (two) times daily. 3 day supply last filled 06-25-20   promethazine 25 MG tablet Commonly known as: PHENERGAN Take 1 tablet (25 mg total) by mouth every 6 (six) hours as needed for nausea or vomiting.   thiamine 100 MG tablet Take 1 tablet (100 mg total) by mouth daily. Start taking on: July 12, 2020       Discharge Assessment: Vitals:   07/10/20 2026 07/11/20 0413  BP: (!) 146/109 (!) 140/98  Pulse: 88 94  Resp: 19 19  Temp: 98 F (36.7 C) 98.1 F (36.7 C)  SpO2: 100% 99%   Skin clean, dry and intact without evidence of skin break down, no evidence of skin tears noted. IV catheter discontinued intact. Site without signs and symptoms of complications - no redness or edema noted at insertion site, patient denies c/o pain - only slight tenderness at site.  Dressing with slight pressure applied.  D/c Instructions-Education: Discharge instructions given to patient/family with verbalized understanding. D/c education completed with patient/family including follow up instructions, medication list, d/c activities limitations if  indicated, with other d/c instructions as indicated by MD - patient able to verbalize understanding, all questions fully answered. Patient instructed to return to ED, call 911, or call MD for any changes in condition.  Patient escorted via [place], and D/C home via private auto.  Zenaida Deed, RN 07/11/2020 11:00 AM

## 2020-07-11 NOTE — TOC Transition Note (Signed)
Transition of Care [facility]) - CM/SW Discharge Note   Patient Details  Name: Olivia Lester MRN: [id number] Date of Birth: 06-22-1976  [facility] CM/SW Contact:  Natasha Bence, LCSW Phone Number: 07/11/2020, 11:44 AM   Clinical Narrative:    Patient let hospital prior to CSW follow up for PCP. CSW placed call to patien'ts provided contact information. Patient's number not in service. CSW placed call to patient's mother. Patient's mother agreeable to care connect referral. CSW LVM for [facility] referral. CSW will follow up with [facility] on Monday 07/13/20 during operating hours. TOC signing off.   Final next level of care: Home/Self Care Barriers to Discharge: Continued Medical Work up   Patient Goals and CMS Choice Patient states their goals for this hospitalization and ongoing recovery are:: Return home   Choice offered to / list presented to : NA  Discharge Placement                       Discharge Plan and Services In-house Referral: Clinical Social Work Discharge Planning Services: CM Consult Post Acute Care Choice: NA          DME Arranged: N/A DME Agency: NA       HH Arranged: NA HH Agency: NA        Social Determinants of Health (SDOH) Interventions     Readmission Risk Interventions Readmission Risk Prevention Plan 07/07/2020  Transportation Screening Complete  Medication Review Press photographer) Complete  HRI or [place] Complete  SW Recovery Care/Counseling Consult Complete  [place] Not Applicable  Some recent data might be hidden

## 2020-07-11 NOTE — Discharge Summary (Signed)
Physician Discharge Summary  Ricardo Kayes YWV:[id number] DOB: [birth year] DOA: 07/07/2020  PCP: Patient, No Pcp Per  Admit date: 07/07/2020  Discharge date: 07/11/2020  Admitted From:Home  Disposition:  Home  Recommendations for Outpatient Follow-up:  1. Follow up with PCP in 1-2 weeks with list of PCP follow-up to be given by TOC 2. Continue on pain medications as prescribed below with 0 refills 3. Continue on other home medications as prior 4. Counseled extensively on alcohol use cessation and given Librium taper also given resources for alcohol abuse counseling  Home Health: None  Equipment/Devices: None  Discharge Condition:Stable  CODE STATUS: Full  Diet recommendation: Heart Healthy  Brief/Interim Summary: Per HPI: Olivia Lester a 44 y.o.femalewith medical history significantforrecreational drug abuse, polysubstance abuse, multiple hospitalizations for acute pancreatitis, alcohol abuse, history of severe alcohol withdrawal including seizures, presents to the emergency department complaining of abdominal pain in the epigastrium similar to prior episodes of pancreatitis. She was recently discharged from a different hospital approximately 1 week ago for similar admission for pancreatitis. The patient reports that she started binging alcohol after arriving home. She now presents with similar symptoms concerning for acute pancreatitis. She also reports nausea and vomiting associated with the abdominal pain and retching. She denies chest pain or shortness of breath. She denies fever and chills.  -Patient had been admitted with concerns of acute pancreatitis in the setting of ongoing alcohol abuse.  She was also noted to have a recent fall.  She was being treated for acute pancreatitis with minimal lipase elevation noted with n.p.o. status and aggressive IV fluid hydration, but continued to have significant amounts of pain.  She had CT imaging performed demonstrating dilated common  bile duct which was then further pursued by GI with MRCP that appeared to be stable.  Her LFTs remained minimally elevated and this was likely due to fatty liver as well as her ongoing alcohol use.  The MRI also demonstrated healing rib fractures in the lower rib cage on the right side which appears to be now the source of her pain.  She will require ongoing medications with Tylenol, Lidoderm patch, and narcotics as prescribed below.  She will need close follow-up with PCP in the near future and has been counseled on alcohol use cessation.  Discharge Diagnoses:  Active Problems:   Alcoholism ([place])   Thrombocytopenia ([facility])   Transaminitis   Tobacco dependence   Hepatitis C   Alcohol abuse   Hypokalemia   Essential hypertension   Polysubstance abuse ([place])   Hypomagnesemia   Alcoholic hepatitis without ascites--Severe hepatic steatosis   Pancreatitis, recurrent   Fatty liver, alcoholic/Severe hepatic steatosis   Hyponatremia   Recurrent pancreatitis   Acute on chronic pancreatitis ([facility])   Alcohol withdrawal seizure ([place])   Leukocytosis   Acute pancreatitis  Principal discharge diagnosis: Right upper quadrant abdominal pain secondary to recent, healing rib fractures.  Possible mild acute pancreatitis-related to ongoing alcohol use resolved  Discharge Instructions  Discharge Instructions    Diet - low sodium heart healthy   Complete by: As directed    Increase activity slowly   Complete by: As directed      Allergies as of 07/11/2020      Reactions   Toradol [ketorolac Tromethamine] Other (See Comments)   Patient advised not to take due to ulcers   Asa [aspirin] Rash      Medication List    TAKE these medications   acetaminophen 500 MG tablet Commonly known as: TYLENOL Take  2 tablets (1,000 mg total) by mouth every 6 (six) hours for 7 days. What changed:   medication strength  how much to take  when to take this  reasons to take this   amLODipine 5 MG  tablet Commonly known as: NORVASC Take 1 tablet (5 mg total) by mouth daily.   chlordiazePOXIDE 10 MG capsule Commonly known as: LIBRIUM Take 1 capsule (10 mg total) by mouth 2 (two) times daily for 3 days, THEN 1 capsule (10 mg total) daily for 3 days. Start taking on: July 11, 2020   Eliquis 2.5 MG Tabs tablet Generic drug: apixaban Take 2.5 mg by mouth 2 (two) times daily. Last filled 16-10-96   folic acid 1 MG tablet Commonly known as: FOLVITE Take 1 tablet (1 mg total) by mouth daily.   lidocaine 5 % Commonly known as: LIDODERM Place 1 patch onto the skin daily. Remove & Discard patch within 12 hours or as directed by MD Start taking on: July 12, 2020   methocarbamol 500 MG tablet Commonly known as: ROBAXIN Take 1 tablet (500 mg total) by mouth every 8 (eight) hours as needed for muscle spasms.   multivitamin with minerals Tabs tablet Take 1 tablet by mouth every other day.   Oxycodone HCl 10 MG Tabs Take 1 tablet (10 mg total) by mouth every 6 (six) hours as needed for up to 7 days for breakthrough pain.   Pancrelipase (Lip-Prot-Amyl) 24000-76000 units Cpep Take 1 capsule (24,000 Units total) by mouth 3 (three) times daily before meals.   pantoprazole 40 MG tablet Commonly known as: Protonix Take 1 tablet (40 mg total) by mouth daily.   potassium chloride SA 20 MEQ tablet Commonly known as: KLOR-CON Take 20 mEq by mouth 2 (two) times daily. 3 day supply last filled 06-25-20   promethazine 25 MG tablet Commonly known as: PHENERGAN Take 1 tablet (25 mg total) by mouth every 6 (six) hours as needed for nausea or vomiting.   thiamine 100 MG tablet Take 1 tablet (100 mg total) by mouth daily. Start taking on: July 12, 2020       Follow-up Information    pcp Follow up in 1 week(s).              Allergies  Allergen Reactions  . Toradol [Ketorolac Tromethamine] Other (See Comments)    Patient advised not to take due to ulcers  . Asa [Aspirin]  Rash    Consultations:  GI   Procedures/Studies: CT ABDOMEN PELVIS W CONTRAST  Result Date: 07/09/2020 CLINICAL DATA:  Inpatient. Epigastric abdominal pain radiating to the back with nausea and vomiting for 3 days. History of pancreatitis and hepatitis C. EXAM: CT ABDOMEN AND PELVIS WITH CONTRAST TECHNIQUE: Multidetector CT imaging of the abdomen and pelvis was performed using the standard protocol following bolus administration of intravenous contrast. CONTRAST:  1mL OMNIPAQUE IOHEXOL 300 MG/ML  SOLN COMPARISON:  06/03/2020 CT abdomen/pelvis. FINDINGS: Lower chest: No significant pulmonary nodules or acute consolidative airspace disease. Hepatobiliary: Diffuse hepatic steatosis. Normal liver size with no definite liver surface irregularity. No liver masses. Normal gallbladder with no radiopaque cholelithiasis. No intrahepatic biliary ductal dilatation. Mildly dilated common bile duct (8 mm diameter), previously 5 mm, increased. No radiopaque choledocholithiasis. Pancreas: Normal, with no mass or duct dilation. Spleen: Normal size. No mass. Adrenals/Urinary Tract: Normal adrenals. Normal kidneys with no hydronephrosis and no renal mass. Normal bladder. Stomach/Bowel: Normal non-distended stomach. Normal caliber small bowel with no small bowel wall thickening. Normal appendix.  Normal large bowel with no diverticulosis, large bowel wall thickening or pericolonic fat stranding. Vascular/Lymphatic: Atherosclerotic nonaneurysmal abdominal aorta. Patent portal, splenic, hepatic and renal veins. No pathologically enlarged lymph nodes in the abdomen or pelvis. 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 No left adnexal mass. Grossly normal uterus. Other: No pneumoperitoneum, ascites or focal fluid collection. Subcutaneous gas in ventral left lower abdominal wall is presumably related to subcutaneous injection medication.  Musculoskeletal: No aggressive appearing focal osseous lesions. Status post bilateral posterior spinal fusion in the lower lumbar spine. IMPRESSION: 1. Mildly dilated common bile duct (8 mm diameter), increased from recent prior CT, without intrahepatic biliary ductal dilatation. No radiopaque choledocholithiasis. Recommend correlation with serum bilirubin levels. If there is clinical concern for acute biliary pathology, consider further evaluation with MRI abdomen with MRCP without and with IV contrast. 2. No evidence of bowel obstruction or acute bowel inflammation. Normal appendix. 3. Diffuse hepatic steatosis. 4. 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 5. Aortic Atherosclerosis (ICD10-I70.0). Electronically Signed   By: Ilona Sorrel M.D.   On: 07/09/2020 12:25   MR 3D Recon At Scanner  Result Date: 07/10/2020 CLINICAL DATA:  Inpatient. Right upper quadrant abdominal pain. History of recurrent pancreatitis and transaminitis. Dilated common bile duct on ultrasound. EXAM: MRI ABDOMEN WITHOUT AND WITH CONTRAST (INCLUDING MRCP) TECHNIQUE: Multiplanar multisequence MR imaging of the abdomen was performed both before and after the administration of intravenous contrast. Heavily T2-weighted images of the biliary and pancreatic ducts were obtained, and three-dimensional MRCP images were rendered by post processing. CONTRAST:  37mL GADAVIST GADOBUTROL 1 MMOL/ML IV SOLN COMPARISON:  07/09/2020 CT abdomen/pelvis and abdominal sonogram. Multiple MRI abdomen studies, most recent 04/21/2020. FINDINGS: Lower chest: No acute abnormality at the lung bases. Hepatobiliary: Normal liver size and configuration. Moderate diffuse hepatic steatosis. No liver mass. Normal gallbladder with no cholelithiasis. No intrahepatic biliary ductal dilatation. Chronic diffusely mildly dilated common bile duct, 9 mm diameter, not appreciably changed since 03/11/2019 MRI. No biliary filling defects to suggest choledocholithiasis. No biliary masses,  strictures or beading. Pancreas: No pancreatic mass or duct dilation.  No pancreas divisum. Spleen: Normal size. No mass. Adrenals/Urinary Tract: Normal adrenals. No hydronephrosis. Subcentimeter simple renal cortical cyst in the lower left kidney. No suspicious renal masses. Stomach/Bowel: Normal non-distended stomach. Visualized small and large bowel is normal caliber, with no bowel wall thickening. Vascular/Lymphatic: Normal caliber abdominal aorta. Patent portal, splenic, hepatic and renal veins. No pathologically enlarged lymph nodes in the abdomen. Other: No abdominal ascites or focal fluid collection. Musculoskeletal: No aggressive appearing focal osseous lesions. Bilateral posterior spinal fusion hardware in the lower lumbar spine. Healing fractures in the anterior right fifth, sixth and seventh ribs (series 22/image 25). IMPRESSION: 1. Healing subacute fractures in the anterior right fifth, sixth and seventh ribs. Correlate with injury history. 2. Chronic diffusely mildly dilated common bile duct, not appreciably changed since 03/11/2019 MRI. No intrahepatic biliary ductal dilatation. No cholelithiasis. No choledocholithiasis. 3. Moderate diffuse hepatic steatosis. Electronically Signed   By: Ilona Sorrel M.D.   On: 07/10/2020 15:55   MR ABDOMEN MRCP W WO CONTAST  Result Date: 07/10/2020 CLINICAL DATA:  Inpatient. Right upper quadrant abdominal pain. History of recurrent pancreatitis and transaminitis. Dilated common bile duct on ultrasound. EXAM: MRI ABDOMEN WITHOUT AND WITH CONTRAST (INCLUDING MRCP) TECHNIQUE: Multiplanar multisequence MR imaging of the abdomen was performed both before and after the administration of intravenous contrast. Heavily T2-weighted images of the  biliary and pancreatic ducts were obtained, and three-dimensional MRCP images were rendered by post processing. CONTRAST:  33mL GADAVIST GADOBUTROL 1 MMOL/ML IV SOLN COMPARISON:  07/09/2020 CT abdomen/pelvis and abdominal sonogram.  Multiple MRI abdomen studies, most recent 04/21/2020. FINDINGS: Lower chest: No acute abnormality at the lung bases. Hepatobiliary: Normal liver size and configuration. Moderate diffuse hepatic steatosis. No liver mass. Normal gallbladder with no cholelithiasis. No intrahepatic biliary ductal dilatation. Chronic diffusely mildly dilated common bile duct, 9 mm diameter, not appreciably changed since 03/11/2019 MRI. No biliary filling defects to suggest choledocholithiasis. No biliary masses, strictures or beading. Pancreas: No pancreatic mass or duct dilation.  No pancreas divisum. Spleen: Normal size. No mass. Adrenals/Urinary Tract: Normal adrenals. No hydronephrosis. Subcentimeter simple renal cortical cyst in the lower left kidney. No suspicious renal masses. Stomach/Bowel: Normal non-distended stomach. Visualized small and large bowel is normal caliber, with no bowel wall thickening. Vascular/Lymphatic: Normal caliber abdominal aorta. Patent portal, splenic, hepatic and renal veins. No pathologically enlarged lymph nodes in the abdomen. Other: No abdominal ascites or focal fluid collection. Musculoskeletal: No aggressive appearing focal osseous lesions. Bilateral posterior spinal fusion hardware in the lower lumbar spine. Healing fractures in the anterior right fifth, sixth and seventh ribs (series 22/image 25). IMPRESSION: 1. Healing subacute fractures in the anterior right fifth, sixth and seventh ribs. Correlate with injury history. 2. Chronic diffusely mildly dilated common bile duct, not appreciably changed since 03/11/2019 MRI. No intrahepatic biliary ductal dilatation. No cholelithiasis. No choledocholithiasis. 3. Moderate diffuse hepatic steatosis. Electronically Signed   By: Ilona Sorrel M.D.   On: 07/10/2020 15:55   US Abdomen Limited RUQ (LIVER/GB)  Result Date: 07/09/2020 CLINICAL DATA:  Acute pancreatitis. EXAM: ULTRASOUND ABDOMEN LIMITED RIGHT UPPER QUADRANT COMPARISON:  CT 07/09/2020.  FINDINGS: Gallbladder: No gallstones or wall thickening visualized. Positive Murphy's sign by ultrasound Common bile duct: Diameter: 8.6 no intrahepatic biliary distention. Liver: Liver appears enlarged. Hepatomegaly cannot be excluded. Increased hepatic echogenicity consistent fatty infiltration or hepatocellular disease. No focal hepatic abnormality identified. Portal vein is patent on color Doppler imaging with normal direction of blood flow towards the liver. Other: None. IMPRESSION: 1. No gallstones. No gallbladder wall thickening. Common bile duct is prominent at 8.6 mm. Positive Murphy sign by ultrasound. 2. The liver appears enlarged. Increased hepatic echogenicity consistent with fatty infiltration or hepatocellular disease. Electronically Signed   By: Marcello Moores  Register   On: 07/09/2020 16:23      Discharge Exam: Vitals:   07/10/20 2026 07/11/20 0413  BP: (!) 146/109 (!) 140/98  Pulse: 88 94  Resp: 19 19  Temp: 98 F (36.7 C) 98.1 F (36.7 C)  SpO2: 100% 99%   Vitals:   07/10/20 1100 07/10/20 1500 07/10/20 2026 07/11/20 0413  BP: (!) 142/103 (!) 161/108 (!) 146/109 (!) 140/98  Pulse:  (!) 104 88 94  Resp:  17 19 19   Temp:  99.3 F (37.4 C) 98 F (36.7 C) 98.1 F (36.7 C)  TempSrc:  Oral    SpO2:  100% 100% 99%  Weight:      Height:        General: Pt is alert, awake, not in acute distress Cardiovascular: RRR, S1/S2 +, no rubs, no gallops Respiratory: CTA bilaterally, no wheezing, no rhonchi Abdominal: Soft, NT, ND, bowel sounds + Extremities: no edema, no cyanosis    The results of significant diagnostics from this hospitalization (including imaging, microbiology, ancillary and laboratory) are listed below for reference.     Microbiology: No results found  for this or any previous visit (from the past 240 hour(s)).   Labs: BNP (last 3 results) No results for input(s): BNP in the last 8760 hours. Basic Metabolic Panel: Recent Labs  Lab 07/06/20 2255  07/07/20 0548 07/07/20 0813 07/08/20 0441 07/09/20 0548 07/10/20 0751 07/11/20 0508  NA 128*  --   --  132* 128* 135 131*  K 2.8*  --   --  3.1* 3.8 3.6 3.7  CL 85*  --   --  96* 96* 103 101  CO2 29  --   --  26 24 24  20*  GLUCOSE 93  --   --  83 94 90 84  BUN 9  --   --  9 9 5* <5*  CREATININE 0.40*  --  0.39* 0.35* 0.36* 0.37* 0.44  CALCIUM 9.8  --   --  8.6* 8.6* 8.7* 8.5*  MG  --  1.6*  --  1.7 1.5* 1.7 1.6*   Liver Function Tests: Recent Labs  Lab 07/06/20 2255 07/08/20 0441 07/09/20 0548 07/10/20 0751 07/11/20 0508  AST 76* 101* 61* 87* 140*  ALT 52* 52* 48* 59* 72*  ALKPHOS 105 73 66 70 66  BILITOT 0.8 1.0 1.0 0.7 1.0  PROT 8.9* 6.2* 5.9* 6.2* 6.2*  ALBUMIN 4.6 3.2* 3.1* 3.2* 3.2*   Recent Labs  Lab 07/06/20 2255 07/08/20 0441 07/09/20 0548 07/10/20 0751  LIPASE 106* 33 28 25   No results for input(s): AMMONIA in the last 168 hours. CBC: Recent Labs  Lab 07/07/20 0813 07/08/20 0441 07/09/20 0548 07/10/20 0428 07/10/20 0751 07/11/20 0508  WBC 8.4 6.0 4.7 5.9  --  4.7  NEUTROABS  --  3.6 2.6 4.3  --  3.2  HGB 15.6* 13.6 13.1 9.9* 14.0 13.1  HCT 46.9* 40.2 38.8 30.7* 41.6 38.5  MCV 101.1* 99.5 100.8* 103.0*  --  99.2  PLT 122* 101* 85* 72*  --  55*   Cardiac Enzymes: No results for input(s): CKTOTAL, CKMB, CKMBINDEX, TROPONINI in the last 168 hours. BNP: Invalid input(s): POCBNP CBG: No results for input(s): GLUCAP in the last 168 hours. D-Dimer No results for input(s): DDIMER in the last 72 hours. Hgb A1c No results for input(s): HGBA1C in the last 72 hours. Lipid Profile No results for input(s): CHOL, HDL, LDLCALC, TRIG, CHOLHDL, LDLDIRECT in the last 72 hours. Thyroid function studies No results for input(s): TSH, T4TOTAL, T3FREE, THYROIDAB in the last 72 hours.  Invalid input(s): FREET3 Anemia work up No results for input(s): VITAMINB12, FOLATE, FERRITIN, TIBC, IRON, RETICCTPCT in the last 72 hours. Urinalysis    Component Value  Date/Time   COLORURINE YELLOW (A) 06/10/2020 1157   APPEARANCEUR CLEAR (A) 06/10/2020 1157   APPEARANCEUR Clear 12/07/2013 0133   LABSPEC 1.013 06/10/2020 1157   LABSPEC 1.006 12/07/2013 0133   PHURINE 6.0 06/10/2020 1157   GLUCOSEU NEGATIVE 06/10/2020 1157   GLUCOSEU Negative 12/07/2013 0133   HGBUR LARGE (A) 06/10/2020 1157   BILIRUBINUR NEGATIVE 06/10/2020 1157   BILIRUBINUR Negative 12/07/2013 0133   KETONESUR 20 (A) 06/10/2020 1157   PROTEINUR 30 (A) 06/10/2020 1157   UROBILINOGEN 0.2 04/28/2015 1559   NITRITE NEGATIVE 06/10/2020 1157   LEUKOCYTESUR NEGATIVE 06/10/2020 1157   LEUKOCYTESUR Negative 12/07/2013 0133   Sepsis Labs Invalid input(s): PROCALCITONIN,  WBC,  LACTICIDVEN Microbiology No results found for this or any previous visit (from the past 240 hour(s)).   Time coordinating discharge: 35 minutes  SIGNED:   Rodena Goldmann, DO [facility] 07/11/2020, 10:23 AM  If 7PM-7AM, please contact night-coverage [email or web address]

## 2020-07-28 ENCOUNTER — Emergency Department (HOSPITAL_COMMUNITY)
Admission: EM | Admit: 2020-07-28 | Discharge: 2020-07-28 | Discharge status: Against Medical Advice | Payer: Medicaid Other

## 2020-07-28 NOTE — ED Triage Notes (Signed)
Left prior to triage

## 2020-08-05 ENCOUNTER — Emergency Department (HOSPITAL_COMMUNITY)
Admission: EM | Admit: 2020-08-05 | Discharge: 2020-08-05 | Disposition: A | Discharge status: Home / Self Care | Payer: Medicaid Other | Attending: Emergency Medicine | Admitting: Emergency Medicine

## 2020-08-05 ENCOUNTER — Other Ambulatory Visit: Payer: Self-pay

## 2020-08-05 ENCOUNTER — Encounter (HOSPITAL_COMMUNITY): Payer: Self-pay

## 2020-08-05 ENCOUNTER — Emergency Department (HOSPITAL_COMMUNITY): Payer: Medicaid Other

## 2020-08-05 DIAGNOSIS — Z5321 Procedure and treatment not carried out due to patient leaving prior to being seen by health care provider: Secondary | ICD-10-CM | POA: Insufficient documentation

## 2020-08-05 DIAGNOSIS — R509 Fever, unspecified: Secondary | ICD-10-CM | POA: Insufficient documentation

## 2020-08-05 DIAGNOSIS — R058 Other specified cough: Secondary | ICD-10-CM | POA: Insufficient documentation

## 2020-08-05 DIAGNOSIS — R0781 Pleurodynia: Secondary | ICD-10-CM | POA: Insufficient documentation

## 2020-08-05 DIAGNOSIS — W010XXA Fall on same level from slipping, tripping and stumbling without subsequent striking against object, initial encounter: Secondary | ICD-10-CM | POA: Insufficient documentation
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
## 2020-08-10 ENCOUNTER — Encounter (HOSPITAL_COMMUNITY): Payer: Self-pay | Admitting: Emergency Medicine

## 2020-08-10 ENCOUNTER — Other Ambulatory Visit: Payer: Self-pay

## 2020-08-10 ENCOUNTER — Emergency Department (HOSPITAL_COMMUNITY)
Admission: EM | Admit: 2020-08-10 | Discharge: 2020-08-10 | Disposition: A | Discharge status: Home / Self Care | Payer: Medicaid Other | Attending: Emergency Medicine | Admitting: Emergency Medicine

## 2020-08-10 DIAGNOSIS — R000 Tachycardia, unspecified: Secondary | ICD-10-CM | POA: Insufficient documentation

## 2020-08-10 DIAGNOSIS — R112 Nausea with vomiting, unspecified: Secondary | ICD-10-CM | POA: Insufficient documentation

## 2020-08-10 DIAGNOSIS — Z7901 Long term (current) use of anticoagulants: Secondary | ICD-10-CM | POA: Insufficient documentation

## 2020-08-10 DIAGNOSIS — R1013 Epigastric pain: Secondary | ICD-10-CM | POA: Insufficient documentation

## 2020-08-10 DIAGNOSIS — F10930 Alcohol use, unspecified with withdrawal, uncomplicated: Secondary | ICD-10-CM

## 2020-08-10 DIAGNOSIS — R251 Tremor, unspecified: Secondary | ICD-10-CM | POA: Insufficient documentation

## 2020-08-10 DIAGNOSIS — I10 Essential (primary) hypertension: Secondary | ICD-10-CM | POA: Insufficient documentation

## 2020-08-10 DIAGNOSIS — Z79899 Other long term (current) drug therapy: Secondary | ICD-10-CM | POA: Insufficient documentation

## 2020-08-10 DIAGNOSIS — R110 Nausea: Secondary | ICD-10-CM

## 2020-08-10 DIAGNOSIS — F17210 Nicotine dependence, cigarettes, uncomplicated: Secondary | ICD-10-CM | POA: Insufficient documentation

## 2020-08-10 DIAGNOSIS — F10230 Alcohol dependence with withdrawal, uncomplicated: Secondary | ICD-10-CM

## 2020-08-10 LAB — CBC WITH DIFFERENTIAL/PLATELET
Abs Immature Granulocytes: 0.05 10*3/uL (ref 0.00–0.07)
Basophils Absolute: 0 10*3/uL (ref 0.0–0.1)
Basophils Relative: 0 %
Eosinophils Absolute: 0 10*3/uL (ref 0.0–0.5)
Eosinophils Relative: 0 %
HCT: 39.2 % (ref 36.0–46.0)
Hemoglobin: 13.1 g/dL (ref 12.0–15.0)
Immature Granulocytes: 1 %
Lymphocytes Relative: 12 %
Lymphs Abs: 1.2 10*3/uL (ref 0.7–4.0)
MCH: 33 pg (ref 26.0–34.0)
MCHC: 33.4 g/dL (ref 30.0–36.0)
MCV: 98.7 fL (ref 80.0–100.0)
Monocytes Absolute: 0.6 10*3/uL (ref 0.1–1.0)
Monocytes Relative: 6 %
Neutro Abs: 8.1 10*3/uL — ABNORMAL HIGH (ref 1.7–7.7)
Neutrophils Relative %: 81 %
Platelets: 93 10*3/uL — ABNORMAL LOW (ref 150–400)
RBC: 3.97 MIL/uL (ref 3.87–5.11)
RDW: 14.4 % (ref 11.5–15.5)
WBC: 9.9 10*3/uL (ref 4.0–10.5)
nRBC: 0 % (ref 0.0–0.2)

## 2020-08-10 LAB — URINALYSIS, ROUTINE W REFLEX MICROSCOPIC
Bilirubin Urine: NEGATIVE
Glucose, UA: NEGATIVE mg/dL
Hgb urine dipstick: NEGATIVE
Ketones, ur: 80 mg/dL — AB
Leukocytes,Ua: NEGATIVE
Nitrite: POSITIVE — AB
Protein, ur: 30 mg/dL — AB
Specific Gravity, Urine: 1.014 (ref 1.005–1.030)
WBC, UA: >50 WBC/hpf — ABNORMAL HIGH (ref 0–5)
pH: 6 (ref 5.0–8.0)

## 2020-08-10 LAB — RAPID URINE DRUG SCREEN, HOSP PERFORMED
Amphetamines: NOT DETECTED
Barbiturates: NOT DETECTED
Benzodiazepines: POSITIVE — AB
Cocaine: NOT DETECTED
Opiates: NOT DETECTED
Tetrahydrocannabinol: POSITIVE — AB

## 2020-08-10 LAB — COMPREHENSIVE METABOLIC PANEL
ALT: 68 U/L — ABNORMAL HIGH (ref 0–44)
AST: 136 U/L — ABNORMAL HIGH (ref 15–41)
Albumin: 4.3 g/dL (ref 3.5–5.0)
Alkaline Phosphatase: 88 U/L (ref 38–126)
BUN: 9 mg/dL (ref 6–20)
CO2: 20 mmol/L — ABNORMAL LOW (ref 22–32)
Calcium: 8.8 mg/dL — ABNORMAL LOW (ref 8.9–10.3)
Chloride: 93 mmol/L — ABNORMAL LOW (ref 98–111)
Creatinine, Ser: 0.58 mg/dL (ref 0.44–1.00)
GFR, Estimated: >60 mL/min (ref >60)
Glucose, Bld: 114 mg/dL — ABNORMAL HIGH (ref 70–99)
Potassium: 3.4 mmol/L — ABNORMAL LOW (ref 3.5–5.1)
Sodium: 139 mmol/L (ref 135–145)
Total Bilirubin: 1.4 mg/dL — ABNORMAL HIGH (ref 0.3–1.2)
Total Protein: 7.3 g/dL (ref 6.5–8.1)

## 2020-08-10 LAB — LIPASE, BLOOD: Lipase: 25 U/L (ref 11–51)

## 2020-08-10 LAB — HCG, QUANTITATIVE, PREGNANCY: hCG, Beta Chain, Quant, S: 4 m[IU]/mL (ref <5)

## 2020-08-10 LAB — ETHANOL: Alcohol, Ethyl (B): 50 mg/dL — ABNORMAL HIGH (ref <10)

## 2020-08-10 MED ORDER — CHLORDIAZEPOXIDE HCL 25 MG PO CAPS: ORAL_CAPSULE | ORAL | 0 refills | Status: DC

## 2020-08-10 MED ORDER — LORAZEPAM 2 MG/ML IJ SOLN
2.0000 mg | Freq: Once | INTRAMUSCULAR | Status: AC
  Administered 2020-08-10: 2 mg via INTRAVENOUS
  Filled 2020-08-10: qty 1

## 2020-08-10 MED ORDER — PROMETHAZINE HCL 25 MG/ML IJ SOLN
12.5000 mg | Freq: Once | INTRAMUSCULAR | Status: AC
  Administered 2020-08-10: 12.5 mg via INTRAVENOUS
  Filled 2020-08-10: qty 1

## 2020-08-10 MED ORDER — PROMETHAZINE HCL 25 MG PO TABS: 25.0000 mg | ORAL_TABLET | Freq: Four times a day (QID) | ORAL | 0 refills | Status: DC | PRN

## 2020-08-10 MED ORDER — SODIUM CHLORIDE 0.9 % IV BOLUS
1000.0000 mL | Freq: Once | INTRAVENOUS | Status: AC
  Administered 2020-08-10: 1000 mL via INTRAVENOUS

## 2020-08-10 NOTE — ED Provider Notes (Signed)
[facility] EMERGENCY DEPARTMENT Provider Note   CSN: [id number] Arrival date & time: 08/10/20  0253     History Chief Complaint  Patient presents with  . Emesis    Olivia Lester is a 45 y.o. female.  Patient is a 45 year old female with history of chronic alcoholism, polysubstance abuse, hepatitis C, alcohol withdrawal related seizures, alcohol induced pancreatitis.  She is brought by EMS for evaluation of shaking and tremors.  She has also been having nausea and vomiting as well as epigastric pain.  Patient last consumed alcohol yesterday and believes she is in withdrawal.  Patient has been admitted recently with a similar presentation.  She has resumed drinking upon returning home.  The history is provided by the patient.       Past Medical History:  Diagnosis Date  . Alcohol abuse   . Back injury   . Gastric ulcer   . Gastric ulcer 10/14/2013  . Hepatitis C 10/14/2013  . History of kidney stones   . 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 Pancreatitis, acute   . Panic attack   . Pneumonia   . Polysubstance abuse ([place])   . PONV (postoperative nausea and vomiting)   . Seizures [facility])     Patient Active Problem List   Diagnosis Date Noted  . Recurrent pancreatitis 07/07/2020  . Acute on chronic pancreatitis ([place]) 07/07/2020  . Alcohol withdrawal seizure ([place]) 07/07/2020  . Leukocytosis 07/07/2020  . Acute pancreatitis 07/07/2020  . Seizure ([place]) 06/10/2020  . Abnormal LFTs 06/10/2020  . AKI (acute kidney injury) ([place]) 06/10/2020  . Fall 06/10/2020  . Hyponatremia 06/10/2020  . Laceration of forehead   . Sepsis due to Escherichia coli (E. coli) ([place]) 04/20/2020  . Acute pyelonephritis 04/18/2020  . Sepsis ([place]) 04/17/2020  . Fatty liver, alcoholic/Severe hepatic steatosis 01/29/2020  . Pancreatitis, recurrent 01/26/2020  . Colitis 10/28/2019  . Polysubstance abuse ([place]) 10/28/2019  . Hypomagnesemia 10/28/2019  . Alcoholic hepatitis without  ascites--Severe hepatic steatosis 10/28/2019  . Alcoholic gastritis [id number]  . DTs (delirium tremens) ([place])   . Urinary tract infection without hematuria   . Alcohol intoxication ([place]) 09/04/2019  . Alcohol withdrawal ([place]) 06/24/2019  . Sinus tachycardia 06/24/2019  . Essential hypertension 06/24/2019  . Hypokalemia 03/31/2019  . Cannabis abuse 03/11/2019  . Elevated transaminase level   . Alcohol abuse   . Closed displaced fracture of fifth metatarsal bone of right foot 08/02/2018  . Dental abscess 12/25/2017  . Gastric ulcer 10/14/2013  . Transaminitis 10/14/2013  . Tobacco dependence 10/14/2013  . Hepatitis C 10/14/2013  . Thrombocytopenia ([place]) 10/13/2013  . Duodenitis 10/12/2013  . Alcoholism ([place]) 10/12/2013  . Abdominal pain, epigastric 10/12/2013  . ANKLE PAIN 01/01/2008    Past Surgical History:  Procedure Laterality Date  . BACK SURGERY    . BREAST SURGERY    . ESOPHAGOGASTRODUODENOSCOPY (EGD) WITH PROPOFOL N/A 10/14/2013   Procedure: ESOPHAGOGASTRODUODENOSCOPY (EGD) WITH PROPOFOL;  Surgeon: Rogene Houston, MD;  Location: [facility];  Service: Endoscopy;  Laterality: N/A;  . ORIF TOE FRACTURE Right 08/08/2018   Procedure: RIGHT FOOT INTERNAL FIXATION 5TH METATARSAL;  Surgeon: Newt Minion, MD;  Location: [facility];  Service: Orthopedics;  Laterality: Right;  . TONSILLECTOMY       OB History    Gravida  7   Para  3   Term  3   Preterm      AB  4   Living  3  SAB  2   IAB  2   Ectopic      Multiple      Live Births  3           Family History  Problem Relation Age of Onset  . Cancer Mother 11       ovarian   . Hypertension Mother   . Stroke Mother   . Early death Father   . Alcohol abuse Father     Social History   Tobacco Use  . Smoking status: Current Every Day Smoker    Packs/day: 1.00    Years: 20.00    Pack years: 20.00    Types: Cigarettes  . Smokeless tobacco: Never Used  Vaping Use  . Vaping Use: Never used   Substance Use Topics  . Alcohol use: Yes    Alcohol/week: 18.0 standard drinks    Types: 18 Cans of beer per week    Comment: 1/5 -1/2 gallon of liqour daily or 18 beers daily  . Drug use: Yes    Types: Marijuana, Cocaine, Methamphetamines    Comment: denies current cocaine/meth...current mj    Home Medications Prior to Admission medications   Medication Sig Start Date End Date Taking? Authorizing Provider  amLODipine (NORVASC) 5 MG tablet Take 1 tablet (5 mg total) by mouth daily. 10/28/19   Emokpae, Courage, MD  ELIQUIS 2.5 MG TABS tablet Take 2.5 mg by mouth 2 (two) times daily. Last filled 07-02-20 07/02/20   [provider]  folic acid (FOLVITE) 1 MG tablet Take 1 tablet (1 mg total) by mouth daily. 10/29/19   Roxan Hockey, MD  lidocaine (LIDODERM) 5 % Place 1 patch onto the skin daily. Remove & Discard patch within 12 hours or as directed by MD 07/12/20   Heath Lark D, DO  lipase/protease/amylase 24000-76000 units CPEP Take 1 capsule (24,000 Units total) by mouth 3 (three) times daily before meals. Patient not taking: No sig reported 10/29/19   Roxan Hockey, MD  methocarbamol (ROBAXIN) 500 MG tablet Take 1 tablet (500 mg total) by mouth every 8 (eight) hours as needed for muscle spasms. 07/11/20   Manuella Ghazi, Pratik D, DO  Multiple Vitamin (MULTIVITAMIN WITH MINERALS) TABS tablet Take 1 tablet by mouth every other day.  Patient not taking: No sig reported    [provider]  pantoprazole (PROTONIX) 40 MG tablet Take 1 tablet (40 mg total) by mouth daily. 06/03/20 09/01/20  Harvest Dark, MD  potassium chloride SA (KLOR-CON) 20 MEQ tablet Take 20 mEq by mouth 2 (two) times daily. 3 day supply last filled 06-25-20 06/25/20   [provider]  promethazine (PHENERGAN) 25 MG tablet Take 1 tablet (25 mg total) by mouth every 6 (six) hours as needed for nausea or vomiting. Patient not taking: Reported on 07/07/2020 06/03/20   Harvest Dark, MD  thiamine 100  MG tablet Take 1 tablet (100 mg total) by mouth daily. 07/12/20 08/11/20  Heath Lark D, DO    Allergies    Toradol [ketorolac tromethamine] and Asa [aspirin]  Review of Systems   Review of Systems  All other systems reviewed and are negative.   Physical Exam Updated Vital Signs BP (!) 152/106   Pulse (!) 132   Resp 20   Ht 5\' 7"  (1.702 m)   Wt 47.6 kg   SpO2 100%   BMI 16.44 kg/m   Physical Exam Vitals and nursing note reviewed.  Constitutional:      General: She is not in  acute distress.    Appearance: She is well-developed and well-nourished. She is not diaphoretic.     Comments: Patient is awake and alert.  She is extremely tremulous and anxious.  HENT:     Head: Normocephalic and atraumatic.  Cardiovascular:     Rate and Rhythm: Normal rate and regular rhythm.     Heart sounds: No murmur heard. No friction rub. No gallop.   Pulmonary:     Effort: Pulmonary effort is normal. No respiratory distress.     Breath sounds: Normal breath sounds. No wheezing.  Abdominal:     General: Bowel sounds are normal. There is no distension.     Palpations: Abdomen is soft.     Tenderness: There is no abdominal tenderness.  Musculoskeletal:        General: Normal range of motion.     Cervical back: Normal range of motion and neck supple.  Skin:    General: Skin is warm and dry.  Neurological:     Mental Status: She is alert and oriented to person, place, and time.     Comments: She is extremely tremulous, anxious, and hyperventilating.     ED Results / Procedures / Treatments   Labs (all labs ordered are listed, but only abnormal results are displayed) Labs Reviewed  COMPREHENSIVE METABOLIC PANEL  ETHANOL  CBC WITH DIFFERENTIAL/PLATELET  URINALYSIS, ROUTINE W REFLEX MICROSCOPIC  RAPID URINE DRUG SCREEN, HOSP PERFORMED  LIPASE, BLOOD  I-STAT BETA HCG BLOOD, ED ([facility], [facility], [facility] ONLY)    EKG EKG Interpretation  Date/Time:  Monday August 10 2020 03:10:01  EST Ventricular Rate:  133 PR Interval:    QRS Duration: 90 QT Interval:  310 QTC Calculation: 462 R Axis:   66 Text Interpretation: Sinus tachycardia LAE, consider biatrial enlargement Borderline repolarization abnormality Baseline wander in lead(s) V2 V3 No significant change since 08/05/2020 Confirmed by Veryl Speak [phone]) on 08/10/2020 3:30:31 AM   Radiology No results found.  Procedures Procedures (including critical care time)  Medications Ordered in ED Medications  sodium chloride 0.9 % bolus 1,000 mL (has no administration in time range)  LORazepam (ATIVAN) injection 2 mg (has no administration in time range)    ED Course  I have reviewed the triage vital signs and the nursing notes.  Pertinent labs & imaging results that were available during my care of the patient were reviewed by me and considered in my medical decision making (see chart for details).    MDM Rules/Calculators/A&P  Patient is a 45 year old female with chronic alcoholism presenting with complaints of abdominal pain and feeling jittery.  She was recently admitted for alcohol withdrawal and pancreatitis, but resumed drinking, most recently yesterday.  Patient's laboratory studies show a blood alcohol level of 50.  Patient does appear anxious, but has improved after receiving Ativan and IV fluids.  She reports feeling nauseated and was given Zofran and Phenergan.  At this point, I feel as though patient can safely be discharged.  Her heart rate is now in the 90s and she is asleep and resting comfortably.  She will be discharged with Phenergan for nausea as well as a Librium taper.  She will be given outpatient resource guide for alcohol/substance abuse treatment.  Final Clinical Impression(s) / ED Diagnoses Final diagnoses:  None    Rx / DC Orders ED Discharge Orders    None       Veryl Speak, MD 08/10/20 [phone]

## 2020-08-10 NOTE — ED Triage Notes (Signed)
[facility] - pt c/o tremors, N/V since 2000. Last etoh was 1200 yesterday.

## 2020-08-10 NOTE — Discharge Instructions (Addendum)
Begin taking Librium as prescribed.  Take Phenergan as prescribed as needed for nausea.  Follow-up with outpatient alcohol treatment programs as desired.  These are supplied in the your discharge summary.

## 2020-10-01 ENCOUNTER — Emergency Department (HOSPITAL_COMMUNITY): Payer: Self-pay

## 2020-10-01 ENCOUNTER — Encounter (HOSPITAL_COMMUNITY): Payer: Self-pay | Admitting: *Deleted

## 2020-10-01 ENCOUNTER — Emergency Department (HOSPITAL_COMMUNITY)
Admission: EM | Admit: 2020-10-01 | Discharge: 2020-10-01 | Disposition: A | Discharge status: Home / Self Care | Payer: Self-pay | Attending: Emergency Medicine | Admitting: Emergency Medicine

## 2020-10-01 ENCOUNTER — Other Ambulatory Visit: Payer: Self-pay

## 2020-10-01 DIAGNOSIS — Z7901 Long term (current) use of anticoagulants: Secondary | ICD-10-CM | POA: Insufficient documentation

## 2020-10-01 DIAGNOSIS — I712 Thoracic aortic aneurysm, without rupture, unspecified: Secondary | ICD-10-CM

## 2020-10-01 DIAGNOSIS — W108XXA Fall (on) (from) other stairs and steps, initial encounter: Secondary | ICD-10-CM | POA: Insufficient documentation

## 2020-10-01 DIAGNOSIS — I10 Essential (primary) hypertension: Secondary | ICD-10-CM | POA: Insufficient documentation

## 2020-10-01 DIAGNOSIS — S7002XA Contusion of left hip, initial encounter: Secondary | ICD-10-CM | POA: Insufficient documentation

## 2020-10-01 DIAGNOSIS — M25561 Pain in right knee: Secondary | ICD-10-CM | POA: Insufficient documentation

## 2020-10-01 DIAGNOSIS — W19XXXA Unspecified fall, initial encounter: Secondary | ICD-10-CM

## 2020-10-01 DIAGNOSIS — Y9301 Activity, walking, marching and hiking: Secondary | ICD-10-CM | POA: Insufficient documentation

## 2020-10-01 DIAGNOSIS — M25552 Pain in left hip: Secondary | ICD-10-CM

## 2020-10-01 DIAGNOSIS — Z79899 Other long term (current) drug therapy: Secondary | ICD-10-CM | POA: Insufficient documentation

## 2020-10-01 DIAGNOSIS — R0789 Other chest pain: Secondary | ICD-10-CM

## 2020-10-01 DIAGNOSIS — F17210 Nicotine dependence, cigarettes, uncomplicated: Secondary | ICD-10-CM | POA: Insufficient documentation

## 2020-10-01 LAB — COMPREHENSIVE METABOLIC PANEL
ALT: 221 U/L — ABNORMAL HIGH (ref 0–44)
AST: 219 U/L — ABNORMAL HIGH (ref 15–41)
Albumin: 4.9 g/dL (ref 3.5–5.0)
Alkaline Phosphatase: 84 U/L (ref 38–126)
Anion gap: 16 — ABNORMAL HIGH (ref 5–15)
BUN: 10 mg/dL (ref 6–20)
CO2: 19 mmol/L — ABNORMAL LOW (ref 22–32)
Calcium: 9.9 mg/dL (ref 8.9–10.3)
Chloride: 102 mmol/L (ref 98–111)
Creatinine, Ser: 0.65 mg/dL (ref 0.44–1.00)
GFR, Estimated: >60 mL/min (ref >60)
Glucose, Bld: 73 mg/dL (ref 70–99)
Potassium: 3.9 mmol/L (ref 3.5–5.1)
Sodium: 137 mmol/L (ref 135–145)
Total Bilirubin: 0.6 mg/dL (ref 0.3–1.2)
Total Protein: 8.3 g/dL — ABNORMAL HIGH (ref 6.5–8.1)

## 2020-10-01 LAB — CBC WITH DIFFERENTIAL/PLATELET
Abs Immature Granulocytes: 0.04 10*3/uL (ref 0.00–0.07)
Basophils Absolute: 0.1 10*3/uL (ref 0.0–0.1)
Basophils Relative: 0 %
Eosinophils Absolute: 0 10*3/uL (ref 0.0–0.5)
Eosinophils Relative: 0 %
HCT: 37.1 % (ref 36.0–46.0)
Hemoglobin: 12.3 g/dL (ref 12.0–15.0)
Immature Granulocytes: 0 %
Lymphocytes Relative: 23 %
Lymphs Abs: 3 10*3/uL (ref 0.7–4.0)
MCH: 32.5 pg (ref 26.0–34.0)
MCHC: 33.2 g/dL (ref 30.0–36.0)
MCV: 98.1 fL (ref 80.0–100.0)
Monocytes Absolute: 0.9 10*3/uL (ref 0.1–1.0)
Monocytes Relative: 7 %
Neutro Abs: 8.9 10*3/uL — ABNORMAL HIGH (ref 1.7–7.7)
Neutrophils Relative %: 70 %
Platelets: 320 10*3/uL (ref 150–400)
RBC: 3.78 MIL/uL — ABNORMAL LOW (ref 3.87–5.11)
RDW: 14.1 % (ref 11.5–15.5)
WBC: 12.9 10*3/uL — ABNORMAL HIGH (ref 4.0–10.5)
nRBC: 0 % (ref 0.0–0.2)

## 2020-10-01 LAB — TROPONIN I (HIGH SENSITIVITY)
Troponin I (High Sensitivity): 4 ng/L (ref <18)
Troponin I (High Sensitivity): 4 ng/L (ref <18)

## 2020-10-01 LAB — ETHANOL: Alcohol, Ethyl (B): <10 mg/dL (ref <10)

## 2020-10-01 LAB — D-DIMER, QUANTITATIVE: D-Dimer, Quant: 1.7 ug/mL-FEU — ABNORMAL HIGH (ref 0.00–0.50)

## 2020-10-01 MED ORDER — LORAZEPAM 1 MG PO TABS: 0.0000 mg | ORAL_TABLET | Freq: Four times a day (QID) | ORAL | Status: DC

## 2020-10-01 MED ORDER — THIAMINE HCL 100 MG PO TABS: 100.0000 mg | ORAL_TABLET | Freq: Every day | ORAL | Status: DC

## 2020-10-01 MED ORDER — LORAZEPAM 2 MG/ML IJ SOLN: 0.0000 mg | Freq: Two times a day (BID) | INTRAMUSCULAR | Status: DC

## 2020-10-01 MED ORDER — KETOROLAC TROMETHAMINE 30 MG/ML IJ SOLN
15.0000 mg | Freq: Once | INTRAMUSCULAR | Status: AC
  Administered 2020-10-01: 15 mg via INTRAVENOUS
  Filled 2020-10-01: qty 1

## 2020-10-01 MED ORDER — LORAZEPAM 1 MG PO TABS: 0.0000 mg | ORAL_TABLET | Freq: Two times a day (BID) | ORAL | Status: DC

## 2020-10-01 MED ORDER — SODIUM CHLORIDE 0.9 % IV BOLUS
1000.0000 mL | Freq: Once | INTRAVENOUS | Status: AC
Start: 2020-10-01 — End: 2020-10-01
  Administered 2020-10-01: 1000 mL via INTRAVENOUS

## 2020-10-01 MED ORDER — LORAZEPAM 2 MG/ML IJ SOLN
0.0000 mg | Freq: Four times a day (QID) | INTRAMUSCULAR | Status: DC
  Administered 2020-10-01: 2 mg via INTRAVENOUS
  Filled 2020-10-01: qty 1

## 2020-10-01 MED ORDER — IOHEXOL 350 MG/ML SOLN
75.0000 mL | Freq: Once | INTRAVENOUS | Status: AC | PRN
  Administered 2020-10-01: 75 mL via INTRAVENOUS

## 2020-10-01 MED ORDER — THIAMINE HCL 100 MG/ML IJ SOLN
100.0000 mg | Freq: Every day | INTRAMUSCULAR | Status: DC
  Administered 2020-10-01: 100 mg via INTRAVENOUS
  Filled 2020-10-01: qty 2

## 2020-10-01 MED ORDER — CYCLOBENZAPRINE HCL 10 MG PO TABS
10.0000 mg | ORAL_TABLET | Freq: Once | ORAL | Status: AC
  Administered 2020-10-01: 10 mg via ORAL
  Filled 2020-10-01: qty 1

## 2020-10-01 NOTE — Discharge Instructions (Addendum)
I suspect your pain is result of a muscular strain.  I recommend over-the-counter pain medications like ibuprofen and/or Tylenol every 6 hours as needed please follow dosing the back of bottle.  Your imaging shows that you have a aortic aneurysm I like you to follow-up in 1 years time for repeat MRA, CTA for further evaluation. Given you contact information for community health and wellness they can help you find a primary care provider please call.  Come back to the emergency department if you develop chest pain, shortness of breath, severe abdominal pain, uncontrolled nausea, vomiting, diarrhea.

## 2020-10-01 NOTE — ED Provider Notes (Signed)
[facility] Provider Note   CSN: [id number] Arrival date & time: 10/01/20  6295     History Chief Complaint  Patient presents with  . Chest Pain    Olivia Lester is a 45 y.o. female.  HPI   Patient with significant medical history of alcohol use, hep C, kidney stones, pain attacks, polysubstance abuse presents with multitude of complaints.  She endorses that she is mainly here because she is having left-sided rib pain as well as left hip pain and right knee pain.  She endorses last night she was walking up some stairs and the stairs gave way causing her to fall onto her left side.  She denies hitting her head, losing consciousness, is not on anticoagulant.  She states that she has severe left hip pain, worsening with movement, is able to ambulate.  She also endorses right knee pain again worsening with ambulation, is able to apply weight to it.  She denies paresthesia or weakness in the lower extremities.  Patient states she has been having chest pain for the last 2 days, describes as a sharp sensation her beneath her left breast, does not radiate, is associated with shortness of breath, denies exertion making it worse, denies becoming diaphoretic, lightheadedness or dizziness.  Patient has no cardiac history, no history of PEs or DVTs, currently not on hormone therapy.  Patient denies  alleviating factors.  Patient denies headaches, fevers, chills, abdominal pain, nausea, vomiting, diarrhea, worsening pedal edema.  Past Medical History:  Diagnosis Date  . Alcohol abuse   . Back injury   . Gastric ulcer   . Gastric ulcer 10/14/2013  . Hepatitis C 10/14/2013  . History of kidney stones   . 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 Pancreatitis, acute   . Panic attack   . Pneumonia   . Polysubstance abuse ([place])   . PONV (postoperative nausea and vomiting)   . Seizures [facility])     Patient Active Problem List   Diagnosis Date Noted  . Recurrent pancreatitis  07/07/2020  . Acute on chronic pancreatitis ([place]) 07/07/2020  . Alcohol withdrawal seizure ([place]) 07/07/2020  . Leukocytosis 07/07/2020  . Acute pancreatitis 07/07/2020  . Seizure ([place]) 06/10/2020  . Abnormal LFTs 06/10/2020  . AKI (acute kidney injury) ([place]) 06/10/2020  . Fall 06/10/2020  . Hyponatremia 06/10/2020  . Laceration of forehead   . Sepsis due to Escherichia coli (E. coli) ([place]) 04/20/2020  . Acute pyelonephritis 04/18/2020  . Sepsis ([place]) 04/17/2020  . Fatty liver, alcoholic/Severe hepatic steatosis 01/29/2020  . Pancreatitis, recurrent 01/26/2020  . Colitis 10/28/2019  . Polysubstance abuse ([place]) 10/28/2019  . Hypomagnesemia 10/28/2019  . Alcoholic hepatitis without ascites--Severe hepatic steatosis 10/28/2019  . Alcoholic gastritis 28/41/3244  . DTs (delirium tremens) ([place])   . Urinary tract infection without hematuria   . Alcohol intoxication ([place]) 09/04/2019  . Alcohol withdrawal ([place]) 06/24/2019  . Sinus tachycardia 06/24/2019  . Essential hypertension 06/24/2019  . Hypokalemia 03/31/2019  . Cannabis abuse 03/11/2019  . Elevated transaminase level   . Alcohol abuse   . Closed displaced fracture of fifth metatarsal bone of right foot 08/02/2018  . Dental abscess 12/25/2017  . Gastric ulcer 10/14/2013  . Transaminitis 10/14/2013  . Tobacco dependence 10/14/2013  . Hepatitis C 10/14/2013  . Thrombocytopenia ([place]) 10/13/2013  . Duodenitis 10/12/2013  . Alcoholism ([place]) 10/12/2013  . Abdominal pain, epigastric 10/12/2013  . ANKLE PAIN 01/01/2008    Past Surgical History:  Procedure Laterality Date  . BACK SURGERY    . BREAST SURGERY    . ESOPHAGOGASTRODUODENOSCOPY (EGD) WITH PROPOFOL N/A 10/14/2013   Procedure: ESOPHAGOGASTRODUODENOSCOPY (EGD) WITH PROPOFOL;  Surgeon: Rogene Houston, MD;  Location: [facility];  Service: Endoscopy;  Laterality: N/A;  . ORIF TOE FRACTURE Right 08/08/2018   Procedure: RIGHT FOOT INTERNAL FIXATION 5TH METATARSAL;  Surgeon: Newt Minion, MD;  Location: [facility];  Service: Orthopedics;  Laterality: Right;  . TONSILLECTOMY       OB History    Gravida  7   Para  3   Term  3   Preterm      AB  4   Living  3     SAB  2   IAB  2   Ectopic      Multiple      Live Births  3           Family History  Problem Relation Age of Onset  . Cancer Mother 29       ovarian   . Hypertension Mother   . Stroke Mother   . Early death Father   . Alcohol abuse Father     Social History   Tobacco Use  . Smoking status: Current Every Day Smoker    Packs/day: 1.00    Years: 20.00    Pack years: 20.00    Types: Cigarettes  . Smokeless tobacco: Never Used  Vaping Use  . Vaping Use: Never used  Substance Use Topics  . Alcohol use: Yes    Alcohol/week: 18.0 standard drinks    Types: 18 Cans of beer per week    Comment: 1/5 -1/2 gallon of liqour daily or 18 beers daily  . Drug use: Yes    Types: Marijuana, Cocaine, Methamphetamines    Comment: denies current cocaine/meth...current mj    Home Medications Prior to Admission medications   Medication Sig Start Date End Date Taking? Authorizing Provider  amLODipine (NORVASC) 5 MG tablet Take 1 tablet (5 mg total) by mouth daily. Patient not taking: Reported on 10/01/2020 10/28/19   Roxan Hockey, MD  chlordiazePOXIDE (LIBRIUM) 25 MG capsule 50mg  PO TID x 1D, then 25-50mg  PO BID X 1D, then 25-50mg  PO QD X 1D 08/10/20   Veryl Speak, MD  ELIQUIS 2.5 MG TABS tablet Take 2.5 mg by mouth 2 (two) times daily. Last filled 07-02-20 07/02/20   [provider]  folic acid (FOLVITE) 1 MG tablet Take 1 tablet (1 mg total) by mouth daily. 10/29/19   Roxan Hockey, MD  lidocaine (LIDODERM) 5 % Place 1 patch onto the skin daily. Remove & Discard patch within 12 hours or as directed by MD 07/12/20   Heath Lark D, DO  lipase/protease/amylase 24000-76000 units CPEP Take 1 capsule (24,000 Units total) by mouth 3 (three) times daily before meals. Patient not taking:  No sig reported 10/29/19   Roxan Hockey, MD  methocarbamol (ROBAXIN) 500 MG tablet Take 1 tablet (500 mg total) by mouth every 8 (eight) hours as needed for muscle spasms. 07/11/20   Manuella Ghazi, Pratik D, DO  Multiple Vitamin (MULTIVITAMIN WITH MINERALS) TABS tablet Take 1 tablet by mouth every other day.  Patient not taking: No sig reported    [provider]  pantoprazole (PROTONIX) 40 MG tablet Take 1 tablet (40 mg total) by mouth daily. 06/03/20 09/01/20  Harvest Dark, MD  potassium chloride SA (KLOR-CON) 20 MEQ tablet Take 20 mEq by mouth 2 (two) times daily.  3 day supply last filled 06-25-20 06/25/20   [provider]  promethazine (PHENERGAN) 25 MG tablet Take 1 tablet (25 mg total) by mouth every 6 (six) hours as needed for nausea or vomiting. 08/10/20   Veryl Speak, MD    Allergies    Toradol [ketorolac tromethamine] and Asa [aspirin]  Review of Systems   Review of Systems  Constitutional: Negative for chills and fever.  HENT: Negative for congestion and sore throat.   Respiratory: Positive for shortness of breath.   Cardiovascular: Positive for chest pain.  Gastrointestinal: Negative for abdominal pain, diarrhea, nausea and vomiting.  Genitourinary: Negative for enuresis.  Musculoskeletal: Negative for back pain.       Left hip and right knee pain.  Skin: Negative for rash.  Neurological: Negative for headaches.  Hematological: Does not bruise/bleed easily.    Physical Exam Updated Vital Signs BP (!) 167/101   Pulse 95   Temp 98.4 F (36.9 C) (Oral)   Resp 17   Ht 5\' 6"  (1.676 m)   Wt 52.2 kg   SpO2 99%   BMI 18.56 kg/m   Physical Exam Vitals and nursing note reviewed.  Constitutional:      General: She is not in acute distress.    Appearance: She is not ill-appearing.  HENT:     Head: Normocephalic and atraumatic.     Nose: No congestion.  Eyes:     Conjunctiva/sclera: Conjunctivae normal.  Cardiovascular:     Rate and Rhythm: Normal  rate and regular rhythm.     Pulses: Normal pulses.     Heart sounds: No murmur heard. No friction rub. No gallop.      Comments: Chest pain was reproducible upon palpation, she had noted tenderness along her fifth and sixth rib midclavicular.  No crepitus deformities present. Pulmonary:     Effort: No respiratory distress.     Breath sounds: No wheezing, rhonchi or rales.  Abdominal:     Palpations: Abdomen is soft.     Tenderness: There is no abdominal tenderness.  Musculoskeletal:     Right lower leg: No edema.     Left lower leg: No edema.     Comments: Patient's left hip was visualized she has a noted hematoma, it was tender to palpation, no deformities or crepitus present, no external or internal rotation, no leg shortening present.  She had full range of motion at the hip, knee and ankle neurovascularly intact.  Patient's right knee was visualized it was tender to palpation along her tibial plateau, no crepitus or deformities present, she had full range of motion at her hip, knee, ankle neurovascularly intact.    Skin:    General: Skin is warm and dry.  Neurological:     Mental Status: She is alert.  Psychiatric:        Mood and Affect: Mood normal.     ED Results / Procedures / Treatments   Labs (all labs ordered are listed, but only abnormal results are displayed) Labs Reviewed  COMPREHENSIVE METABOLIC PANEL - Abnormal; Notable for the following components:      Result Value   CO2 19 (*)    Total Protein 8.3 (*)    AST 219 (*)    ALT 221 (*)    Anion gap 16 (*)    All other components within normal limits  CBC WITH DIFFERENTIAL/PLATELET - Abnormal; Notable for the following components:   WBC 12.9 (*)    RBC 3.78 (*)  Neutro Abs 8.9 (*)    All other components within normal limits  D-DIMER, QUANTITATIVE - Abnormal; Notable for the following components:   D-Dimer, Quant 1.70 (*)    All other components within normal limits  ETHANOL  PREGNANCY, URINE   TROPONIN I (HIGH SENSITIVITY)  TROPONIN I (HIGH SENSITIVITY)    EKG None  Radiology DG Knee 2 Views Right  Result Date: 10/01/2020 CLINICAL DATA:  Fall last night with right knee pain. EXAM: RIGHT KNEE - 1-2 VIEW COMPARISON:  None. FINDINGS: No evidence of fracture, dislocation, or joint effusion. Normal joint spaces and alignment. No evidence of arthropathy or other focal bone abnormality. Soft tissues are unremarkable. IMPRESSION: Negative radiographs of the right knee. Electronically Signed   By: Keith Rake M.D.   On: 10/01/2020 18:50   CT Angio Chest PE W and/or Wo Contrast  Result Date: 10/01/2020 CLINICAL DATA:  Left chest pain, shortness of breath and dyspnea which began yesterday EXAM: CT ANGIOGRAPHY CHEST WITH CONTRAST TECHNIQUE: Multidetector CT imaging of the chest was performed using the standard protocol during bolus administration of intravenous contrast. Multiplanar CT image reconstructions and MIPs were obtained to evaluate the vascular anatomy. CONTRAST:  72mL OMNIPAQUE IOHEXOL 350 MG/ML SOLN COMPARISON:  Radiograph 10/01/2020, CT 09/22/2019 FINDINGS: Cardiovascular: Satisfactory opacification the pulmonary arteries to the segmental level. No pulmonary artery filling defects are identified. Central pulmonary arteries are normal caliber. Normal heart size. No pericardial effusion. The aortic root is suboptimally assessed given cardiac pulsation artifact. Ascending thoracic aorta measures up to 4 cm though this may be spuriously accentuated by extensive motion artifact. More normal caliber of the aorta by the level of the distal arch measuring only 2.9 cm. Normally branching aortic arch without acute abnormality of the proximal great vessels. Minimal atherosclerotic calcifications seen in the thoracic aorta and left common carotid origin. No major venous abnormality. Mediastinum/Nodes: No mediastinal fluid or gas. Normal thyroid gland and thoracic inlet. No acute abnormality of  the trachea or esophagus. No worrisome mediastinal, hilar or axillary adenopathy. Lungs/Pleura: Slight motion degradation. No consolidation, features of edema, pneumothorax, or effusion. No suspicious pulmonary nodules or masses. Upper Abdomen: No acute abnormalities present in the visualized portions of the upper abdomen. Diffuse hepatic hypoattenuation compatible with hepatic steatosis. Sparing along the gallbladder fossa. Musculoskeletal: No acute osseous abnormality or suspicious osseous lesion. No worrisome chest wall mass or lesion. Review of the MIP images confirms the above findings. IMPRESSION: 1. No evidence of pulmonary embolism. 2. No acute intrathoracic process. 3. Ascending thoracic aorta measures up to 4 cm though this may be spuriously accentuated by motion artifact. Recommend 1 year followup by CTA or MRA. This recommendation follows 2010 ACCF/AHA/AATS/ACR/ASA/SCA/SCAI/SIR/STS/SVM Guidelines for the Diagnosis and Management of Patients with Thoracic Aortic Disease. Circulation. 2010; 121: R518-A416. Aortic aneurysm NOS (ICD10-I71.9) 4. Hepatic steatosis. Electronically Signed   By: Lovena Le M.D.   On: 10/01/2020 20:42   DG Chest Port 1 View  Result Date: 10/01/2020 CLINICAL DATA:  Fall last night with chest pain and shortness of breath. EXAM: PORTABLE CHEST 1 VIEW COMPARISON:  08/05/2020 FINDINGS: The cardiomediastinal contours are normal. The lungs are clear. Pulmonary vasculature is normal. No consolidation, pleural effusion, or pneumothorax. No acute osseous abnormalities are seen. IMPRESSION: No acute chest findings. Electronically Signed   By: Keith Rake M.D.   On: 10/01/2020 18:50   DG Hip Unilat With Pelvis 2-3 Views Left  Result Date: 10/01/2020 CLINICAL DATA:  Fall last night with left hip pain. EXAM:  DG HIP (WITH OR WITHOUT PELVIS) 2-3V LEFT COMPARISON:  None. FINDINGS: The cortical margins of the bony pelvis and left hip are intact. No fracture. Pubic symphysis and  sacroiliac joints are congruent. Left hip joint space is preserved. There is mild acetabular spurring. No visualized pubic rami fracture. Focal soft tissue prominence lateral to the greater trochanter is most consistent with soft tissue hematoma. IMPRESSION: 1. No fracture of the pelvis or left hip. 2. Findings suggestive of lateral hip soft tissue hematoma. Electronically Signed   By: Keith Rake M.D.   On: 10/01/2020 18:49    Procedures Procedures   Medications Ordered in ED Medications  LORazepam (ATIVAN) injection 0-4 mg (2 mg Intravenous Given 10/01/20 1804)    Or  LORazepam (ATIVAN) tablet 0-4 mg ( Oral See Alternative 10/01/20 1804)  LORazepam (ATIVAN) injection 0-4 mg (has no administration in time range)    Or  LORazepam (ATIVAN) tablet 0-4 mg (has no administration in time range)  thiamine tablet 100 mg ( Oral See Alternative 10/01/20 1805)    Or  thiamine (B-1) injection 100 mg (100 mg Intravenous Given 10/01/20 1805)  cyclobenzaprine (FLEXERIL) tablet 10 mg (10 mg Oral Given 10/01/20 1749)  sodium chloride 0.9 % bolus 1,000 mL (0 mLs Intravenous Stopped 10/01/20 2048)  iohexol (OMNIPAQUE) 350 MG/ML injection 75 mL (75 mLs Intravenous Contrast Given 10/01/20 2014)  ketorolac (TORADOL) 30 MG/ML injection 15 mg (15 mg Intravenous Given 10/01/20 2153)    ED Course  I have reviewed the triage vital signs and the nursing notes.  Pertinent labs & imaging results that were available during my care of the patient were reviewed by me and considered in my medical decision making (see chart for details).    MDM Rules/Calculators/A&P                         Initial impression-patient presents with left chest pain, left hip pain, and right knee pain.  She is alert, does not appear in acute distress, vital signs notable for tachycardia.  Concern for ACS, PE, orthopedic injury.  Will obtain chest pain work-up, order imaging of chest, hip, knee and reevaluate.  Work-up-CBC shows slight  leukocytosis of 12.9, no signs anemia.  CMP shows slight decrease in CO2 of 19, elevated liver enzymes AST 219, ALT 221, and gap of 16.  D-dimer elevated at 1.7, initial troponin is 4.  Ethanol less than 10.  CT angio of chest negative for PE, shows a 7 aortic aneurysm measuring up to 4 cm though this is most likely skewed due to artifact recommend 1 year follow-up CTA or MRI.  Reassessment patient reassessed after providing her with a muscle relaxer and Ativan, she states she feeling much better still has some left sided rib pain.  Patient has an elevated D-dimer concern for PE.  Will send patient for CT angio for further evaluation.  Updated patient on recent lab work and imaging, patient has no complaint at this time, vital signs remained stable.  Patient treated for discharge at this time.   Rule out-I have low suspicion for ACS as history is atypical, patient has no cardiac history, EKG was sinus rhythm without signs of ischemia, initial troponin is 4.  Will defer second troponin as patient has been complaining chest pain for last 2 days, and would expect elevation of ACS was present.  Low suspicion for PE as CT angiogram negative.  Patient is a noted ascending aortic aneurysm that is  not rupture at this time, no pulsatile mass on my exam.  Recommends follow-up in 1 years time for repeat CTA or MRI.  Low suspicion for rib fracture or pneumothorax as lung sounds are clear bilaterally, imaging is negative for acute findings.  Low suspicion for fracture of the left hip as imaging is negative, no internal/external rotation present.  Low suspicion for fracture of the right knee as imaging is unremarkable, patient can move her knee without difficulty.  I have low suspicion for gallbladder abnormality as patient was nontender to palpation in her upper right quadrant, patient tolerated p.o. without difficulty.  Patient does have noted elevated liver enzymes, patient is a known alcoholic and I suspect this is  secondary due to alcohol use.  Low suspicion for systemic infection as patient is nontoxic-appearing, vital signs reassuring, no obvious source infection noted on exam.   Plan-patient has atypical chest pain with unknown etiology, differential diagnosis includes costochondritis, anxiety, GERD, surgical angina.  Will recommend over-the-counter pain medication follow-up with PCP for further evaluation.  I suspect patient's hip and knee pain are secondary due to muscular strains will recommend over-the-counter pain medication follow-up with PCP.  Patient was informed of ascending aortic aneurysm will have her follow-up with PCP for reevaluation in 1 years time.  Vital signs have remained stable, no indication for hospital admission. Patient given at home care as well strict return precautions.  Patient verbalized that they understood agreed to said plan.   Final Clinical Impression(s) / ED Diagnoses Final diagnoses:  Fall, initial encounter  Atypical chest pain  Acute pain of right knee  Left hip pain  Thoracic aortic aneurysm without rupture [facility])    Rx / DC Orders ED Discharge Orders    None       Aron Baba 10/01/20 2212    Milton Ferguson, MD 10/04/20 1128

## 2020-10-01 NOTE — ED Triage Notes (Signed)
Multiple complaints. States she fell last night and has pain in back, right knee,,,,left isde hurting, chest pain and shortness of breath

## 2021-03-04 ENCOUNTER — Encounter (HOSPITAL_COMMUNITY): Payer: Self-pay | Admitting: *Deleted

## 2021-03-04 ENCOUNTER — Emergency Department (HOSPITAL_COMMUNITY)
Admission: EM | Admit: 2021-03-04 | Discharge: 2021-03-04 | Disposition: A | Discharge status: Home / Self Care | Payer: Medicaid Other | Attending: Emergency Medicine | Admitting: Emergency Medicine

## 2021-03-04 DIAGNOSIS — R1110 Vomiting, unspecified: Secondary | ICD-10-CM | POA: Insufficient documentation

## 2021-03-04 DIAGNOSIS — R319 Hematuria, unspecified: Secondary | ICD-10-CM | POA: Insufficient documentation

## 2021-03-04 DIAGNOSIS — M549 Dorsalgia, unspecified: Secondary | ICD-10-CM | POA: Insufficient documentation

## 2021-03-04 DIAGNOSIS — Z5321 Procedure and treatment not carried out due to patient leaving prior to being seen by health care provider: Secondary | ICD-10-CM | POA: Insufficient documentation

## 2021-03-04 NOTE — ED Notes (Signed)
Left after triage.

## 2021-03-04 NOTE — ED Triage Notes (Signed)
Passing blood in urine, vomiting for a week

## 2021-03-27 ENCOUNTER — Emergency Department (HOSPITAL_COMMUNITY): Payer: Self-pay

## 2021-03-27 ENCOUNTER — Other Ambulatory Visit: Payer: Self-pay

## 2021-03-27 ENCOUNTER — Inpatient Hospital Stay (HOSPITAL_COMMUNITY)
Admission: EM | Admit: 2021-03-27 | Discharge: 2021-04-02 | DRG: 896 | Disposition: A | Discharge status: Home / Self Care | Payer: Self-pay | Attending: Internal Medicine | Admitting: Internal Medicine

## 2021-03-27 ENCOUNTER — Encounter (HOSPITAL_COMMUNITY): Payer: Self-pay

## 2021-03-27 DIAGNOSIS — M25569 Pain in unspecified knee: Secondary | ICD-10-CM

## 2021-03-27 DIAGNOSIS — T50904A Poisoning by unspecified drugs, medicaments and biological substances, undetermined, initial encounter: Secondary | ICD-10-CM

## 2021-03-27 DIAGNOSIS — G928 Other toxic encephalopathy: Secondary | ICD-10-CM | POA: Diagnosis present

## 2021-03-27 DIAGNOSIS — M545 Low back pain, unspecified: Secondary | ICD-10-CM | POA: Diagnosis present

## 2021-03-27 DIAGNOSIS — F410 Panic disorder [episodic paroxysmal anxiety] without agoraphobia: Secondary | ICD-10-CM | POA: Diagnosis present

## 2021-03-27 DIAGNOSIS — Z811 Family history of alcohol abuse and dependence: Secondary | ICD-10-CM

## 2021-03-27 DIAGNOSIS — Z823 Family history of stroke: Secondary | ICD-10-CM

## 2021-03-27 DIAGNOSIS — F1010 Alcohol abuse, uncomplicated: Secondary | ICD-10-CM | POA: Diagnosis present

## 2021-03-27 DIAGNOSIS — I10 Essential (primary) hypertension: Secondary | ICD-10-CM | POA: Diagnosis present

## 2021-03-27 DIAGNOSIS — J9600 Acute respiratory failure, unspecified whether with hypoxia or hypercapnia: Secondary | ICD-10-CM

## 2021-03-27 DIAGNOSIS — Z8249 Family history of ischemic heart disease and other diseases of the circulatory system: Secondary | ICD-10-CM

## 2021-03-27 DIAGNOSIS — F17210 Nicotine dependence, cigarettes, uncomplicated: Secondary | ICD-10-CM | POA: Diagnosis present

## 2021-03-27 DIAGNOSIS — G9340 Encephalopathy, unspecified: Secondary | ICD-10-CM | POA: Diagnosis present

## 2021-03-27 DIAGNOSIS — D696 Thrombocytopenia, unspecified: Secondary | ICD-10-CM | POA: Diagnosis not present

## 2021-03-27 DIAGNOSIS — G8929 Other chronic pain: Secondary | ICD-10-CM | POA: Diagnosis present

## 2021-03-27 DIAGNOSIS — R569 Unspecified convulsions: Secondary | ICD-10-CM | POA: Diagnosis present

## 2021-03-27 DIAGNOSIS — Z809 Family history of malignant neoplasm, unspecified: Secondary | ICD-10-CM

## 2021-03-27 DIAGNOSIS — G312 Degeneration of nervous system due to alcohol: Secondary | ICD-10-CM | POA: Diagnosis present

## 2021-03-27 DIAGNOSIS — Z7901 Long term (current) use of anticoagulants: Secondary | ICD-10-CM

## 2021-03-27 DIAGNOSIS — Z886 Allergy status to analgesic agent status: Secondary | ICD-10-CM

## 2021-03-27 DIAGNOSIS — J9601 Acute respiratory failure with hypoxia: Secondary | ICD-10-CM | POA: Diagnosis present

## 2021-03-27 DIAGNOSIS — J9602 Acute respiratory failure with hypercapnia: Secondary | ICD-10-CM | POA: Diagnosis present

## 2021-03-27 DIAGNOSIS — F10929 Alcohol use, unspecified with intoxication, unspecified: Secondary | ICD-10-CM | POA: Diagnosis present

## 2021-03-27 DIAGNOSIS — F10239 Alcohol dependence with withdrawal, unspecified: Secondary | ICD-10-CM | POA: Diagnosis not present

## 2021-03-27 DIAGNOSIS — Z0189 Encounter for other specified special examinations: Secondary | ICD-10-CM

## 2021-03-27 DIAGNOSIS — Z20822 Contact with and (suspected) exposure to covid-19: Secondary | ICD-10-CM | POA: Diagnosis present

## 2021-03-27 DIAGNOSIS — E876 Hypokalemia: Secondary | ICD-10-CM | POA: Diagnosis present

## 2021-03-27 DIAGNOSIS — Z885 Allergy status to narcotic agent status: Secondary | ICD-10-CM

## 2021-03-27 DIAGNOSIS — Y908 Blood alcohol level of 240 mg/100 ml or more: Secondary | ICD-10-CM | POA: Diagnosis present

## 2021-03-27 DIAGNOSIS — Z79899 Other long term (current) drug therapy: Secondary | ICD-10-CM

## 2021-03-27 DIAGNOSIS — F10229 Alcohol dependence with intoxication, unspecified: Principal | ICD-10-CM | POA: Diagnosis present

## 2021-03-27 LAB — LACTIC ACID, PLASMA
Lactic Acid, Venous: 2 mmol/L (ref 0.5–1.9)
Lactic Acid, Venous: 2 mmol/L (ref 0.5–1.9)

## 2021-03-27 LAB — RESP PANEL BY RT-PCR (FLU A&B, COVID) ARPGX2
Influenza A by PCR: NEGATIVE
Influenza B by PCR: NEGATIVE
SARS Coronavirus 2 by RT PCR: NEGATIVE

## 2021-03-27 LAB — COMPREHENSIVE METABOLIC PANEL
ALT: 36 U/L (ref 0–44)
AST: 40 U/L (ref 15–41)
Albumin: 4.5 g/dL (ref 3.5–5.0)
Alkaline Phosphatase: 53 U/L (ref 38–126)
Anion gap: 10 (ref 5–15)
BUN: 11 mg/dL (ref 6–20)
CO2: 23 mmol/L (ref 22–32)
Calcium: 8.5 mg/dL — ABNORMAL LOW (ref 8.9–10.3)
Chloride: 106 mmol/L (ref 98–111)
Creatinine, Ser: 0.48 mg/dL (ref 0.44–1.00)
GFR, Estimated: >60 mL/min (ref >60)
Glucose, Bld: 98 mg/dL (ref 70–99)
Potassium: 3.1 mmol/L — ABNORMAL LOW (ref 3.5–5.1)
Sodium: 139 mmol/L (ref 135–145)
Total Bilirubin: 0.4 mg/dL (ref 0.3–1.2)
Total Protein: 7.3 g/dL (ref 6.5–8.1)

## 2021-03-27 LAB — TROPONIN I (HIGH SENSITIVITY)
Troponin I (High Sensitivity): 8 ng/L (ref <18)
Troponin I (High Sensitivity): 36 ng/L — ABNORMAL HIGH (ref <18)

## 2021-03-27 LAB — URINALYSIS, ROUTINE W REFLEX MICROSCOPIC
Bilirubin Urine: NEGATIVE
Glucose, UA: NEGATIVE mg/dL
Hgb urine dipstick: NEGATIVE
Ketones, ur: NEGATIVE mg/dL
Leukocytes,Ua: NEGATIVE
Nitrite: NEGATIVE
Protein, ur: NEGATIVE mg/dL
Specific Gravity, Urine: <1.005 — ABNORMAL LOW (ref 1.005–1.030)
pH: 6 (ref 5.0–8.0)

## 2021-03-27 LAB — CBC WITH DIFFERENTIAL/PLATELET
Abs Immature Granulocytes: 0.03 10*3/uL (ref 0.00–0.07)
Basophils Absolute: 0 10*3/uL (ref 0.0–0.1)
Basophils Relative: 0 %
Eosinophils Absolute: 0 10*3/uL (ref 0.0–0.5)
Eosinophils Relative: 0 %
HCT: 43.7 % (ref 36.0–46.0)
Hemoglobin: 14.8 g/dL (ref 12.0–15.0)
Immature Granulocytes: 0 %
Lymphocytes Relative: 26 %
Lymphs Abs: 2.4 10*3/uL (ref 0.7–4.0)
MCH: 32.2 pg (ref 26.0–34.0)
MCHC: 33.9 g/dL (ref 30.0–36.0)
MCV: 95.2 fL (ref 80.0–100.0)
Monocytes Absolute: 0.4 10*3/uL (ref 0.1–1.0)
Monocytes Relative: 5 %
Neutro Abs: 6.1 10*3/uL (ref 1.7–7.7)
Neutrophils Relative %: 69 %
Platelets: 171 10*3/uL (ref 150–400)
RBC: 4.59 MIL/uL (ref 3.87–5.11)
RDW: 14.2 % (ref 11.5–15.5)
WBC: 8.9 10*3/uL (ref 4.0–10.5)
nRBC: 0 % (ref 0.0–0.2)

## 2021-03-27 LAB — RAPID URINE DRUG SCREEN, HOSP PERFORMED
Amphetamines: NOT DETECTED
Barbiturates: NOT DETECTED
Benzodiazepines: NOT DETECTED
Cocaine: NOT DETECTED
Opiates: NOT DETECTED
Tetrahydrocannabinol: NOT DETECTED

## 2021-03-27 LAB — ETHANOL: Alcohol, Ethyl (B): 290 mg/dL — ABNORMAL HIGH (ref <10)

## 2021-03-27 LAB — HCG, QUANTITATIVE, PREGNANCY: hCG, Beta Chain, Quant, S: 5 m[IU]/mL — ABNORMAL HIGH (ref <5)

## 2021-03-27 LAB — BLOOD GAS, ARTERIAL
Acid-base deficit: 2 mmol/L (ref 0.0–2.0)
Bicarbonate: 23.1 mmol/L (ref 20.0–28.0)
FIO2: 100
O2 Saturation: 99.2 %
Patient temperature: 35.9
pCO2 arterial: 33.5 mmHg (ref 32.0–48.0)
pH, Arterial: 7.426 (ref 7.350–7.450)
pO2, Arterial: 398 mmHg — ABNORMAL HIGH (ref 83.0–108.0)

## 2021-03-27 LAB — PROTIME-INR
INR: 0.9 (ref 0.8–1.2)
Prothrombin Time: 11.8 seconds (ref 11.4–15.2)

## 2021-03-27 LAB — CBG MONITORING, ED: Glucose-Capillary: 110 mg/dL — ABNORMAL HIGH (ref 70–99)

## 2021-03-27 LAB — MAGNESIUM: Magnesium: 1.5 mg/dL — ABNORMAL LOW (ref 1.7–2.4)

## 2021-03-27 LAB — AMMONIA: Ammonia: 24 umol/L (ref 9–35)

## 2021-03-27 MED ORDER — PROPOFOL 1000 MG/100ML IV EMUL
5.0000 ug/kg/min | INTRAVENOUS | Status: DC
  Administered 2021-03-27: 5 ug/kg/min via INTRAVENOUS
  Administered 2021-03-28: 80 ug/kg/min via INTRAVENOUS
  Administered 2021-03-28: 35 ug/kg/min via INTRAVENOUS
  Filled 2021-03-27 (×3): qty 100

## 2021-03-27 MED ORDER — LACTATED RINGERS IV BOLUS
1000.0000 mL | Freq: Once | INTRAVENOUS | Status: AC
  Administered 2021-03-27: 1000 mL via INTRAVENOUS

## 2021-03-27 MED ORDER — SODIUM CHLORIDE 0.9 % IV BOLUS
1000.0000 mL | Freq: Once | INTRAVENOUS | Status: AC
  Administered 2021-03-27: 1000 mL via INTRAVENOUS

## 2021-03-27 MED ORDER — MAGNESIUM SULFATE 2 GM/50ML IV SOLN
2.0000 g | Freq: Once | INTRAVENOUS | Status: AC
  Administered 2021-03-27: 2 g via INTRAVENOUS
  Filled 2021-03-27: qty 50

## 2021-03-27 MED ORDER — POTASSIUM CHLORIDE 10 MEQ/100ML IV SOLN
10.0000 meq | INTRAVENOUS | Status: AC
  Administered 2021-03-27 (×3): 10 meq via INTRAVENOUS
  Filled 2021-03-27 (×2): qty 100

## 2021-03-27 MED ORDER — ROCURONIUM BROMIDE 50 MG/5ML IV SOLN
50.0000 mg | Freq: Once | INTRAVENOUS | Status: AC
  Filled 2021-03-27: qty 5

## 2021-03-27 MED ORDER — ETOMIDATE 2 MG/ML IV SOLN: 20.0000 mg | Freq: Once | INTRAVENOUS | Status: AC

## 2021-03-27 MED ORDER — ROCURONIUM BROMIDE 10 MG/ML (PF) SYRINGE
PREFILLED_SYRINGE | INTRAVENOUS | Status: AC
  Administered 2021-03-27: 50 mg via INTRAVENOUS
  Filled 2021-03-27: qty 10

## 2021-03-27 MED ORDER — ETOMIDATE 2 MG/ML IV SOLN
INTRAVENOUS | Status: AC
  Administered 2021-03-27: 20 mg via INTRAVENOUS
  Filled 2021-03-27: qty 20

## 2021-03-27 NOTE — ED Triage Notes (Signed)
Pt brought to ED via [place], found unresponsive in the yard. Called out for OD. FD started CPR, Narcan 8 mg given total. Pt placed on NR by EMS

## 2021-03-27 NOTE — ED Notes (Signed)
OG tube advanced several cm per recommendation of cxr, gastric contents actively removed via suction with advancement

## 2021-03-27 NOTE — H&P (Signed)
History and Physical    Olivia Lester [id number] DOB: Jan 17, 1976 DOA: 03/27/2021  PCP: Patient, No Pcp Per (Inactive)   Patient coming from: Home.   I have personally briefly reviewed patient's old medical records in [place]  Chief Complaint: Altered mental status.  HPI: Olivia Lester is a 45 y.o. female with medical history significant of alcohol abuse, unspecified back injury PUD with gastric ulcer, history of hepatitis C, history of nephrolithiasis, right foot fracture, history of pancreatitis, panic attacks, history of pneumonia, polysubstance abuse, history of seizures who was brought by EMS after becoming unresponsive due to presumptive overdose with alcohol and maybe on other substances.  EMS responded on scene with CPR and provided 8 mg of Narcan with some response from the patient.  She was brought to the emergency department was intubated for airway protection.  She is unable to provide any history at this time.  ED Course: 96.5 F, pulse 129, respirations 23, BP 181/131 mmHg and O2 sat 100% on ventilator.  She received etomidate and rocuronium before being intubated.  She also received 1000 mL of NS and 1000 mL of LR bolus.  Lab work: Urinalysis and UDS were negative.  CBC was normal.  Unremarkable PT/INR/PTT.  Alcohol level was 290 mg/dL.  Lactic acid has been 2.0 mmol/L x2.  Troponin was 8 and then 36 ng/mL.  CMP showed potassium 3.1 mmol/L and calcium of 8.5 mg/dL.  The rest of the CMP values were normal.  ABG showed an increase PO2 at 398 mmHg on 100% oxygen but the rest of the ABG was unremarkable.  Imaging: CT head without contrast was normal.  One-view portable chest radiograph did not show any acute cardiopulmonary disease.  Post intubation chest radiograph showed minimal retrocardiac atelectasis and inserted tubes.  Review of Systems: As per HPI otherwise all other systems reviewed and are negative.  Past Medical History:  Diagnosis Date   Alcohol abuse    Back injury     Gastric ulcer    Gastric ulcer 10/14/2013   Hepatitis C 10/14/2013   History of kidney stones    Nonunion of foot fracture    right 5th metatarsal   Pancreatitis, acute    Panic attack    Pneumonia    Polysubstance abuse (HCC)    PONV (postoperative nausea and vomiting)    Seizures ([place])    Past Surgical History:  Procedure Laterality Date   BACK SURGERY     BREAST SURGERY     ESOPHAGOGASTRODUODENOSCOPY (EGD) WITH PROPOFOL N/A 10/14/2013   Procedure: ESOPHAGOGASTRODUODENOSCOPY (EGD) WITH PROPOFOL;  Surgeon: Rogene Houston, MD;  Location: [facility];  Service: Endoscopy;  Laterality: N/A;   ORIF TOE FRACTURE Right 08/08/2018   Procedure: RIGHT FOOT INTERNAL FIXATION 5TH METATARSAL;  Surgeon: Newt Minion, MD;  Location: [facility];  Service: Orthopedics;  Laterality: Right;   TONSILLECTOMY     Social History  reports that she has been smoking cigarettes. She has a 20.00 pack-year smoking history. She has never used smokeless tobacco. She reports current alcohol use of about 18.0 standard drinks per week. She reports current drug use. Drugs: Marijuana, Cocaine, and Methamphetamines.  Allergies  Allergen Reactions   Toradol [Ketorolac Tromethamine] Other (See Comments)    Patient advised not to take due to ulcers   Asa [Aspirin] Rash   Family History  Problem Relation Age of Onset   Cancer Mother 28       ovarian    Hypertension Mother  Stroke Mother    Early death Father    Alcohol abuse Father    Prior to Admission medications   Medication Sig Start Date End Date Taking? Authorizing Provider  amLODipine (NORVASC) 5 MG tablet Take 1 tablet (5 mg total) by mouth daily. Patient not taking: Reported on 10/01/2020 10/28/19   Roxan Hockey, MD  chlordiazePOXIDE (LIBRIUM) 25 MG capsule '50mg'$  PO TID x 1D, then 25-'50mg'$  PO BID X 1D, then 25-'50mg'$  PO QD X 1D 08/10/20   Veryl Speak, MD  ELIQUIS 2.5 MG TABS tablet Take 2.5 mg by mouth 2 (two) times daily. Last filled 07-02-20 07/02/20    [provider]  folic acid (FOLVITE) 1 MG tablet Take 1 tablet (1 mg total) by mouth daily. 10/29/19   Roxan Hockey, MD  lidocaine (LIDODERM) 5 % Place 1 patch onto the skin daily. Remove & Discard patch within 12 hours or as directed by MD 07/12/20   Heath Lark D, DO  lipase/protease/amylase 24000-76000 units CPEP Take 1 capsule (24,000 Units total) by mouth 3 (three) times daily before meals. Patient not taking: No sig reported 10/29/19   Roxan Hockey, MD  methocarbamol (ROBAXIN) 500 MG tablet Take 1 tablet (500 mg total) by mouth every 8 (eight) hours as needed for muscle spasms. 07/11/20   Manuella Ghazi, Pratik D, DO  Multiple Vitamin (MULTIVITAMIN WITH MINERALS) TABS tablet Take 1 tablet by mouth every other day.  Patient not taking: No sig reported    [provider]  pantoprazole (PROTONIX) 40 MG tablet Take 1 tablet (40 mg total) by mouth daily. 06/03/20 09/01/20  Harvest Dark, MD  potassium chloride SA (KLOR-CON) 20 MEQ tablet Take 20 mEq by mouth 2 (two) times daily. 3 day supply last filled 06-25-20 06/25/20   [provider]  promethazine (PHENERGAN) 25 MG tablet Take 1 tablet (25 mg total) by mouth every 6 (six) hours as needed for nausea or vomiting. 08/10/20   Veryl Speak, MD   Physical Exam: Vitals:   03/27/21 2215 03/27/21 2230 03/27/21 2300 03/27/21 2315  BP: (!) 118/91  99/76 105/82  Pulse: 78  77 82  Resp: '16  16 16  '$ Temp: (!) 96.4 F (35.8 C) (!) 96.6 F (35.9 C) (!) 97 F (36.1 C) (!) 97 F (36.1 C)  TempSrc:      SpO2: 100%  100% 100%  Weight:      Height:       Constitutional: Intubated/sedated. Eyes: PERRL, lids and conjunctivae normal ENMT: ETT and NGT in place.  Mucous membranes are moist. Posterior pharynx clear of any exudate or lesions. Neck: normal, supple, no masses, no thyromegaly Respiratory: clear to auscultation bilaterally, no wheezing, no crackles. Normal respiratory effort. No accessory muscle use.  Cardiovascular:  Regular rate and rhythm, no murmurs / rubs / gallops. No extremity edema. 2+ pedal pulses. No carotid bruits.  Abdomen: No distention.  Bowel sounds positive.  Soft, no tenderness, no masses palpated. No hepatosplenomegaly.  Musculoskeletal: no clubbing / cyanosis. Good ROM, no contractures. Normal muscle tone.  Skin: no rashes, lesions, ulcers on very limited dermatological examination. Neurologic: Grossly nonfocal.  Sedated.  Unable to fully evaluate. Psychiatric: Sedated on mechanical ventilation.  Labs on Admission: I have personally reviewed following labs and imaging studies  CBC: Recent Labs  Lab 03/27/21 1919  WBC 8.9  NEUTROABS 6.1  HGB 14.8  HCT 43.7  MCV 95.2  PLT [id number]    Basic Metabolic Panel: Recent Labs  Lab 03/27/21 1919 03/27/21 2019  NA 139  --   K 3.1*  --   CL 106  --   CO2 23  --   GLUCOSE 98  --   BUN 11  --   CREATININE 0.48  --   CALCIUM 8.5*  --   MG  --  1.5*    GFR: Estimated Creatinine Clearance: 74 mL/min (by C-G formula based on SCr of 0.48 mg/dL).  Liver Function Tests: Recent Labs  Lab 03/27/21 1919  AST 40  ALT 36  ALKPHOS 53  BILITOT 0.4  PROT 7.3  ALBUMIN 4.5    Urine analysis:    Component Value Date/Time   COLORURINE YELLOW 03/27/2021 1842   APPEARANCEUR CLEAR 03/27/2021 1842        LABSPEC <1.005 (L) 03/27/2021 1842        PHURINE 6.0 03/27/2021 1842   GLUCOSEU NEGATIVE 03/27/2021 1842        HGBUR NEGATIVE 03/27/2021 1842   BILIRUBINUR NEGATIVE 03/27/2021 1842        KETONESUR NEGATIVE 03/27/2021 1842   PROTEINUR NEGATIVE 03/27/2021 1842        NITRITE NEGATIVE 03/27/2021 1842   LEUKOCYTESUR NEGATIVE 03/27/2021 1842        Radiological Exams on Admission: CT HEAD WO CONTRAST (5MM)  Result Date: 03/27/2021 CLINICAL DATA:  Mental status change, unknown cause EXAM: CT HEAD WITHOUT CONTRAST TECHNIQUE: Contiguous axial images were obtained from the base of the skull through the vertex without intravenous  contrast. COMPARISON:  None. FINDINGS: Brain: No acute intracranial abnormality. Specifically, no hemorrhage, hydrocephalus, mass lesion, acute infarction, or significant intracranial injury. Vascular: No hyperdense vessel or unexpected calcification. Skull: No acute calvarial abnormality. Sinuses/Orbits: No acute findings Other: None IMPRESSION: Normal study. Electronically Signed   By: Rolm Baptise M.D.   On: 03/27/2021 20:08   DG Chest Portable 1 View  Result Date: 03/27/2021 CLINICAL DATA:  Post intubation EXAM: PORTABLE CHEST 1 VIEW COMPARISON:  10/01/2020 FINDINGS: endotracheal tube is 4 cm above the carina. NG tube tip is in the distal esophagus. Heart is normal size. Lungs clear. No effusions or acute bony abnormality. IMPRESSION: Endotracheal tube in expected position. NG tube tip in the distal esophagus. Recommend advancing several cm. No acute cardiopulmonary disease. Electronically Signed   By: Rolm Baptise M.D.   On: 03/27/2021 19:12   DG Chest Port 1V same Day  Result Date: 03/27/2021 CLINICAL DATA:  OG tube advancement. EXAM: PORTABLE CHEST 1 VIEW COMPARISON:  Radiograph earlier today. FINDINGS: Enteric tube has been advanced, the tip and side-port now below the diaphragm in the stomach. Endotracheal tube tip is 4.6 cm from the carina. Heart is normal in size with normal mediastinal contours. Minimal retrocardiac atelectasis. No pneumothorax or pulmonary edema. No pleural fluid. Stable osseous structures. IMPRESSION: 1. Enteric tube has been advanced, tip and side-port now below the diaphragm in the stomach. Endotracheal tube tip 4.6 cm from the carina. 2. Minimal retrocardiac atelectasis. Electronically Signed   By: Keith Rake M.D.   On: 03/27/2021 21:50    EKG: Independently reviewed.  Vent. rate 106 BPM PR interval 184 ms QRS duration 102 ms QT/QTcB 350/465 ms P-R-T axes 67 61 88 Sinus tachycardia Consider right atrial enlargement Nonspecific T abnormalities, lateral  leads  Assessment/Plan Principal Problem:   Acute metabolic encephalopathy Due to unknown substances overdose. Intubated for airway protection. Continue mechanical ventilation. Continue propofol sedation as needed. Follow-up arterial blood gas. Follow-up chest radiograph.  Active Problems:   Alcohol abuse with intoxication CIWA  protocol orders preemptively placed. Magnesium has been supplemented. FA, MVI and thiamine.    Hypokalemia Replacing. Follow potassium level. Check phosphorus level.    Essential hypertension Controlled while sedated. Continue BP monitoring. Resume home therapy once alert/extubated.    Hypomagnesemia Magnesium sulfate 2 g IVPB ordered    DVT prophylaxis: Lovenox SQ. Code Status:   Full code. Family Communication:   Disposition Plan:   Patient is from:  Home.  Anticipated DC to:  Home.  Anticipated DC date:  03/30/2021.  Anticipated DC barriers: Clinical status.  Consults called:   Admission status:  Inpatient/ICU.   Severity of Illness: High severity after presenting with altered mental status requiring to be intubated for respiratory function Airway protection.  Reubin Milan MD [facility]  How to contact the [facility] Attending or Consulting provider [place] or covering provider during after hours [place], for this patient?   Check the care team in [facility] and look for a) attending/consulting [facility] provider listed and b) the [facility] team listed Log into [email or web address] and use [place]'s universal password to access. If you do not have the password, please contact the hospital operator. Locate the [facility] provider you are looking for under [facility] and page to a number that you can be directly reached. If you still have difficulty reaching the provider, please page the [facility] (Director on Call) for the Hospitalists listed on amion for assistance.  03/27/2021, 11:55 PM   This document was prepared in Dragon voice recognition software  and may contain some unintended transcription errors.

## 2021-03-27 NOTE — ED Notes (Signed)
Pt intubated with 7.5 ET tube x 1 attempt without difficulty by Dr Regenia Skeeter, 20 at lip. +Co2 change, bilateral breath sounds noted

## 2021-03-27 NOTE — ED Provider Notes (Signed)
[facility] Provider Note   CSN: [id number] Arrival date & time: 03/27/21  1813  LEVEL 5 CAVEAT - ALTERED MENTAL STATUS   History Chief Complaint  Patient presents with   Unresponsive    Olivia Lester is a 45 y.o. female.  HPI 45 year old female presents unresponsive. History is only by EMS due to this. EMS was called out for an overdose, and they found her unresponsive in the yard. Fire department started CPR and gave narcan.  In total she got 8 mg of Narcan including multiple IV doses.  She is slightly more responsive than she was before but is still quite altered and does not follow commands.  Level 5 caveat.  Past Medical History:  Diagnosis Date   Alcohol abuse    Back injury    Gastric ulcer    Gastric ulcer 10/14/2013   Hepatitis C 10/14/2013   History of kidney stones    Nonunion of foot fracture    right 5th metatarsal   Pancreatitis, acute    Panic attack    Pneumonia    Polysubstance abuse ([place])    PONV (postoperative nausea and vomiting)    Seizures ([place])     Patient Active Problem List   Diagnosis Date Noted   Acute encephalopathy 03/27/2021   Recurrent pancreatitis 07/07/2020   Acute on chronic pancreatitis ([place]) 07/07/2020   Alcohol withdrawal seizure ([place]) 07/07/2020   Leukocytosis 07/07/2020   Acute pancreatitis 07/07/2020   Seizure ([place]) 06/10/2020   Abnormal LFTs 06/10/2020   AKI (acute kidney injury) ([place]) 06/10/2020   Fall 06/10/2020   Hyponatremia 06/10/2020   Laceration of forehead    Sepsis due to Escherichia coli (E. coli) ([place]) 04/20/2020   Acute pyelonephritis 04/18/2020   Sepsis ([place]) 04/17/2020   Fatty liver, alcoholic/Severe hepatic steatosis 01/29/2020   Pancreatitis, recurrent 01/26/2020   Colitis 10/28/2019   Polysubstance abuse ([place]) 10/28/2019   Hypomagnesemia 0000000   Alcoholic hepatitis without ascites--Severe hepatic steatosis 0000000   Alcoholic gastritis [id number]   DTs (delirium tremens) ([place])     Urinary tract infection without hematuria    Alcohol intoxication ([place]) 09/04/2019   Alcohol withdrawal ([place]) 06/24/2019   Sinus tachycardia 06/24/2019   Essential hypertension 06/24/2019   Hypokalemia 03/31/2019   Cannabis abuse 03/11/2019   Elevated transaminase level    Alcohol abuse    Closed displaced fracture of fifth metatarsal bone of right foot 08/02/2018   Dental abscess 12/25/2017   Gastric ulcer 10/14/2013   Transaminitis 10/14/2013   Tobacco dependence 10/14/2013   Hepatitis C 10/14/2013   Thrombocytopenia ([place]) 10/13/2013   Duodenitis 10/12/2013   Alcoholism ([place]) 10/12/2013   Abdominal pain, epigastric 10/12/2013   ANKLE PAIN 01/01/2008    Past Surgical History:  Procedure Laterality Date   BACK SURGERY     BREAST SURGERY     ESOPHAGOGASTRODUODENOSCOPY (EGD) WITH PROPOFOL N/A 10/14/2013   Procedure: ESOPHAGOGASTRODUODENOSCOPY (EGD) WITH PROPOFOL;  Surgeon: Rogene Houston, MD;  Location: [facility];  Service: Endoscopy;  Laterality: N/A;   ORIF TOE FRACTURE Right 08/08/2018   Procedure: RIGHT FOOT INTERNAL FIXATION 5TH METATARSAL;  Surgeon: Newt Minion, MD;  Location: [facility];  Service: Orthopedics;  Laterality: Right;   TONSILLECTOMY       OB History     Gravida  7   Para  3   Term  3   Preterm      AB  4   Living  3  SAB  2   IAB  2   Ectopic      Multiple      Live Births  3           Family History  Problem Relation Age of Onset   Cancer Mother 55       ovarian    Hypertension Mother    Stroke Mother    Early death Father    Alcohol abuse Father     Social History   Tobacco Use   Smoking status: Every Day    Packs/day: 1.00    Years: 20.00    Pack years: 20.00    Types: Cigarettes   Smokeless tobacco: Never  Vaping Use   Vaping Use: Never used  Substance Use Topics   Alcohol use: Yes    Alcohol/week: 18.0 standard drinks    Types: 18 Cans of beer per week    Comment: 1/5 -1/2 gallon of liqour daily or 18  beers daily   Drug use: Yes    Types: Marijuana, Cocaine, Methamphetamines    Comment: denies current cocaine/meth...current mj    Home Medications Prior to Admission medications   Medication Sig Start Date End Date Taking? Authorizing Provider  amLODipine (NORVASC) 5 MG tablet Take 1 tablet (5 mg total) by mouth daily. Patient not taking: Reported on 10/01/2020 10/28/19   Roxan Hockey, MD  chlordiazePOXIDE (LIBRIUM) 25 MG capsule '50mg'$  PO TID x 1D, then 25-'50mg'$  PO BID X 1D, then 25-'50mg'$  PO QD X 1D 08/10/20   Veryl Speak, MD  ELIQUIS 2.5 MG TABS tablet Take 2.5 mg by mouth 2 (two) times daily. Last filled 07-02-20 07/02/20   [provider]  folic acid (FOLVITE) 1 MG tablet Take 1 tablet (1 mg total) by mouth daily. 10/29/19   Roxan Hockey, MD  lidocaine (LIDODERM) 5 % Place 1 patch onto the skin daily. Remove & Discard patch within 12 hours or as directed by MD 07/12/20   Heath Lark D, DO  lipase/protease/amylase 24000-76000 units CPEP Take 1 capsule (24,000 Units total) by mouth 3 (three) times daily before meals. Patient not taking: No sig reported 10/29/19   Roxan Hockey, MD  methocarbamol (ROBAXIN) 500 MG tablet Take 1 tablet (500 mg total) by mouth every 8 (eight) hours as needed for muscle spasms. 07/11/20   Manuella Ghazi, Pratik D, DO  Multiple Vitamin (MULTIVITAMIN WITH MINERALS) TABS tablet Take 1 tablet by mouth every other day.  Patient not taking: No sig reported    [provider]  pantoprazole (PROTONIX) 40 MG tablet Take 1 tablet (40 mg total) by mouth daily. 06/03/20 09/01/20  Harvest Dark, MD  potassium chloride SA (KLOR-CON) 20 MEQ tablet Take 20 mEq by mouth 2 (two) times daily. 3 day supply last filled 06-25-20 06/25/20   [provider]  promethazine (PHENERGAN) 25 MG tablet Take 1 tablet (25 mg total) by mouth every 6 (six) hours as needed for nausea or vomiting. 08/10/20   Veryl Speak, MD    Allergies    Toradol [ketorolac tromethamine]  and Asa [aspirin]  Review of Systems   Review of Systems  Unable to perform ROS: Patient unresponsive   Physical Exam Updated Vital Signs BP (!) 130/98   Pulse 78   Temp (!) 95.9 F (35.5 C)   Resp 16   Ht '5\' 6"'$  (1.676 m)   Wt 52.2 kg   SpO2 100%   BMI 18.57 kg/m   Physical Exam Vitals and nursing note reviewed.  Constitutional:      Appearance: She is well-developed. She is ill-appearing.  HENT:     Head: Normocephalic and atraumatic.     Right Ear: External ear normal.     Left Ear: External ear normal.     Nose: Nose normal.  Eyes:     General:        Right eye: No discharge.        Left eye: No discharge.     Comments: Pupils are midsized bilaterally  Cardiovascular:     Rate and Rhythm: Regular rhythm. Tachycardia present.     Heart sounds: Normal heart sounds.  Pulmonary:     Comments: Patient is having some mild obstructive noises with breathing. Repositioning her airway seems to improve this Abdominal:     General: There is no distension.  Musculoskeletal:     Cervical back: Neck supple. No rigidity.  Skin:    General: Skin is warm and dry.  Neurological:     Mental Status: She is unresponsive.     GCS: GCS eye subscore is 1. GCS verbal subscore is 1. GCS motor subscore is 4.  Psychiatric:        Mood and Affect: Mood is not anxious.    ED Results / Procedures / Treatments   Labs (all labs ordered are listed, but only abnormal results are displayed) Labs Reviewed  LACTIC ACID, PLASMA - Abnormal; Notable for the following components:      Result Value   Lactic Acid, Venous 2.0 (*)    All other components within normal limits  LACTIC ACID, PLASMA - Abnormal; Notable for the following components:   Lactic Acid, Venous 2.0 (*)    All other components within normal limits  COMPREHENSIVE METABOLIC PANEL - Abnormal; Notable for the following components:   Potassium 3.1 (*)    Calcium 8.5 (*)    All other components within normal limits  BLOOD GAS,  ARTERIAL - Abnormal; Notable for the following components:   pO2, Arterial 398 (*)    All other components within normal limits  URINALYSIS, ROUTINE W REFLEX MICROSCOPIC - Abnormal; Notable for the following components:   Specific Gravity, Urine <1.005 (*)    All other components within normal limits  ETHANOL - Abnormal; Notable for the following components:   Alcohol, Ethyl (B) 290 (*)    All other components within normal limits  HCG, QUANTITATIVE, PREGNANCY - Abnormal; Notable for the following components:   hCG, Beta Chain, Quant, S 5 (*)    All other components within normal limits  MAGNESIUM - Abnormal; Notable for the following components:   Magnesium 1.5 (*)    All other components within normal limits  CBG MONITORING, ED - Abnormal; Notable for the following components:   Glucose-Capillary 110 (*)    All other components within normal limits  TROPONIN I (HIGH SENSITIVITY) - Abnormal; Notable for the following components:   Troponin I (High Sensitivity) 36 (*)    All other components within normal limits  RESP PANEL BY RT-PCR (FLU A&B, COVID) [id number]  CBC WITH DIFFERENTIAL/PLATELET  PROTIME-INR  RAPID URINE DRUG SCREEN, HOSP PERFORMED  [facility]  I-STAT CHEM 8, ED  TROPONIN I (HIGH SENSITIVITY)    EKG EKG Interpretation  Date/Time:  Saturday March 27 2021 19:01:14 EDT Ventricular Rate:  106 PR Interval:  184 QRS Duration: 102 QT Interval:  350 QTC Calculation: 465 R Axis:   61 Text Interpretation: Sinus tachycardia Consider right atrial enlargement Nonspecific T abnormalities, lateral leads  Confirmed by Sherwood Gambler [phone]) on 03/27/2021 7:13:21 PM  Radiology CT HEAD WO CONTRAST (5MM)  Result Date: 03/27/2021 CLINICAL DATA:  Mental status change, unknown cause EXAM: CT HEAD WITHOUT CONTRAST TECHNIQUE: Contiguous axial images were obtained from the base of the skull through the vertex without intravenous contrast. COMPARISON:  None. FINDINGS: Brain: No acute  intracranial abnormality. Specifically, no hemorrhage, hydrocephalus, mass lesion, acute infarction, or significant intracranial injury. Vascular: No hyperdense vessel or unexpected calcification. Skull: No acute calvarial abnormality. Sinuses/Orbits: No acute findings Other: None IMPRESSION: Normal study. Electronically Signed   By: Rolm Baptise M.D.   On: 03/27/2021 20:08   DG Chest Portable 1 View  Result Date: 03/27/2021 CLINICAL DATA:  Post intubation EXAM: PORTABLE CHEST 1 VIEW COMPARISON:  10/01/2020 FINDINGS: endotracheal tube is 4 cm above the carina. NG tube tip is in the distal esophagus. Heart is normal size. Lungs clear. No effusions or acute bony abnormality. IMPRESSION: Endotracheal tube in expected position. NG tube tip in the distal esophagus. Recommend advancing several cm. No acute cardiopulmonary disease. Electronically Signed   By: Rolm Baptise M.D.   On: 03/27/2021 19:12   DG Chest Port 1V same Day  Result Date: 03/27/2021 CLINICAL DATA:  OG tube advancement. EXAM: PORTABLE CHEST 1 VIEW COMPARISON:  Radiograph earlier today. FINDINGS: Enteric tube has been advanced, the tip and side-port now below the diaphragm in the stomach. Endotracheal tube tip is 4.6 cm from the carina. Heart is normal in size with normal mediastinal contours. Minimal retrocardiac atelectasis. No pneumothorax or pulmonary edema. No pleural fluid. Stable osseous structures. IMPRESSION: 1. Enteric tube has been advanced, tip and side-port now below the diaphragm in the stomach. Endotracheal tube tip 4.6 cm from the carina. 2. Minimal retrocardiac atelectasis. Electronically Signed   By: Keith Rake M.D.   On: 03/27/2021 21:50    Procedures Procedure Name: Intubation Date/Time: 03/27/2021 7:39 PM Performed by: Sherwood Gambler, MD Pre-anesthesia Checklist: Patient identified, Patient being monitored, Emergency Drugs available, Timeout performed and Suction available Oxygen Delivery Method: Non-rebreather  mask Preoxygenation: Pre-oxygenation with 100% oxygen Induction Type: Rapid sequence Ventilation: Two handed mask ventilation required Laryngoscope Size: Glidescope and 3 Grade View: Grade I Tube size: 7.5 mm Number of attempts: 1 Airway Equipment and Method: Video-laryngoscopy Placement Confirmation: ETT inserted through vocal cords under direct vision, CO2 detector and Breath sounds checked- equal and bilateral Secured at: 20 cm Tube secured with: ETT holder Dental Injury: Teeth and Oropharynx as per pre-operative assessment     .Critical Care  Date/Time: 03/27/2021 11:09 PM Performed by: Sherwood Gambler, MD Authorized by: Sherwood Gambler, MD   Critical care provider statement:    Critical care time (minutes):  40   Critical care time was exclusive of:  Separately billable procedures and treating other patients   Critical care was necessary to treat or prevent imminent or life-threatening deterioration of the following conditions:  Respiratory failure   Critical care was time spent personally by me on the following activities:  Discussions with consultants, evaluation of patient's response to treatment, examination of patient, ordering and performing treatments and interventions, ordering and review of laboratory studies, ordering and review of radiographic studies, pulse oximetry, re-evaluation of patient's condition, obtaining history from patient or surrogate and review of old charts   Medications Ordered in ED Medications  propofol (DIPRIVAN) 1000 MG/100ML infusion (20 mcg/kg/min  52.2 kg Intravenous Rate/Dose Change 03/27/21 2140)  potassium chloride 10 mEq in 100 mL IVPB (10 mEq Intravenous New Bag/Given 03/27/21  2040)  magnesium sulfate IVPB 2 g 50 mL (has no administration in time range)  etomidate (AMIDATE) injection 20 mg (20 mg Intravenous Given 03/27/21 1826)  rocuronium (ZEMURON) injection 50 mg (50 mg Intravenous Given 03/27/21 1826)  sodium chloride 0.9 % bolus 1,000 mL (0  mLs Intravenous Stopped 03/27/21 1923)  lactated ringers bolus 1,000 mL (0 mLs Intravenous Stopped 03/27/21 2108)    ED Course  I have reviewed the triage vital signs and the nursing notes.  Pertinent labs & imaging results that were available during my care of the patient were reviewed by me and considered in my medical decision making (see chart for details).    MDM Rules/Calculators/A&P                           Patient has GCS 6 on arrival. Intubated for airway protection. No obvious seizure like activity. Most likely polysubstance overdose with elevated ETOH level and prior history. Low suspicion for acute infection. Workup largely unremarkable besides some mild electrolyte disturbances.  I discussed with Dr. Patsey Berthold of the ICU team, who recommended she can stay here at [facility] for supportive care.  If not improving by tomorrow morning, may need reconsult.  Dr. Olevia Bowens will admit. Final Clinical Impression(s) / ED Diagnoses Final diagnoses:  Acute respiratory failure, unspecified whether with hypoxia or hypercapnia ([place])  Drug overdose, undetermined intent, initial encounter    Rx / DC Orders ED Discharge Orders     None        Sherwood Gambler, MD 03/27/21 2310

## 2021-03-27 NOTE — Progress Notes (Signed)
Transported patient to and from CT without incident.

## 2021-03-28 DIAGNOSIS — F10921 Alcohol use, unspecified with intoxication delirium: Secondary | ICD-10-CM

## 2021-03-28 DIAGNOSIS — I10 Essential (primary) hypertension: Secondary | ICD-10-CM

## 2021-03-28 DIAGNOSIS — E876 Hypokalemia: Secondary | ICD-10-CM

## 2021-03-28 LAB — COMPREHENSIVE METABOLIC PANEL
ALT: 28 U/L (ref 0–44)
AST: 31 U/L (ref 15–41)
Albumin: 3.3 g/dL — ABNORMAL LOW (ref 3.5–5.0)
Alkaline Phosphatase: 41 U/L (ref 38–126)
Anion gap: 5 (ref 5–15)
BUN: 9 mg/dL (ref 6–20)
CO2: 26 mmol/L (ref 22–32)
Calcium: 8.1 mg/dL — ABNORMAL LOW (ref 8.9–10.3)
Chloride: 109 mmol/L (ref 98–111)
Creatinine, Ser: 0.46 mg/dL (ref 0.44–1.00)
GFR, Estimated: >60 mL/min (ref >60)
Glucose, Bld: 91 mg/dL (ref 70–99)
Potassium: 3.4 mmol/L — ABNORMAL LOW (ref 3.5–5.1)
Sodium: 140 mmol/L (ref 135–145)
Total Bilirubin: 0.2 mg/dL — ABNORMAL LOW (ref 0.3–1.2)
Total Protein: 5.5 g/dL — ABNORMAL LOW (ref 6.5–8.1)

## 2021-03-28 LAB — CBC
HCT: 34.2 % — ABNORMAL LOW (ref 36.0–46.0)
Hemoglobin: 11.8 g/dL — ABNORMAL LOW (ref 12.0–15.0)
MCH: 32.9 pg (ref 26.0–34.0)
MCHC: 34.5 g/dL (ref 30.0–36.0)
MCV: 95.3 fL (ref 80.0–100.0)
Platelets: 145 10*3/uL — ABNORMAL LOW (ref 150–400)
RBC: 3.59 MIL/uL — ABNORMAL LOW (ref 3.87–5.11)
RDW: 14.5 % (ref 11.5–15.5)
WBC: 11.7 10*3/uL — ABNORMAL HIGH (ref 4.0–10.5)
nRBC: 0 % (ref 0.0–0.2)

## 2021-03-28 LAB — MAGNESIUM: Magnesium: 1.6 mg/dL — ABNORMAL LOW (ref 1.7–2.4)

## 2021-03-28 LAB — PHOSPHORUS: Phosphorus: 2.1 mg/dL — ABNORMAL LOW (ref 2.5–4.6)

## 2021-03-28 MED ORDER — KCL IN DEXTROSE-NACL 20-5-0.9 MEQ/L-%-% IV SOLN
INTRAVENOUS | Status: DC
  Filled 2021-03-28 (×7): qty 1000

## 2021-03-28 MED ORDER — ADULT MULTIVITAMIN W/MINERALS CH
1.0000 | ORAL_TABLET | Freq: Every day | ORAL | Status: DC
  Administered 2021-03-29 – 2021-04-02 (×5): 1 via ORAL
  Filled 2021-03-28 (×5): qty 1

## 2021-03-28 MED ORDER — FOLIC ACID 1 MG PO TABS
1.0000 mg | ORAL_TABLET | Freq: Every day | ORAL | Status: DC
  Administered 2021-03-29 – 2021-04-02 (×5): 1 mg via ORAL
  Filled 2021-03-28 (×5): qty 1

## 2021-03-28 MED ORDER — MIDAZOLAM HCL 5 MG/5ML IJ SOLN
5.0000 mg | Freq: Once | INTRAMUSCULAR | Status: AC
  Administered 2021-03-28: 5 mg via INTRAVENOUS

## 2021-03-28 MED ORDER — LORAZEPAM 2 MG/ML IJ SOLN
0.0000 mg | INTRAMUSCULAR | Status: AC
  Administered 2021-03-28: 2 mg via INTRAVENOUS
  Administered 2021-03-29: 3 mg via INTRAVENOUS
  Administered 2021-03-29: 4 mg via INTRAVENOUS
  Administered 2021-03-29: 2 mg via INTRAVENOUS
  Administered 2021-03-29: 3 mg via INTRAVENOUS
  Administered 2021-03-29: 4 mg via INTRAVENOUS
  Filled 2021-03-28: qty 1
  Filled 2021-03-28: qty 2
  Filled 2021-03-28: qty 1
  Filled 2021-03-28 (×3): qty 2

## 2021-03-28 MED ORDER — ACETAMINOPHEN 325 MG PO TABS
650.0000 mg | ORAL_TABLET | Freq: Four times a day (QID) | ORAL | Status: DC | PRN
  Administered 2021-03-28 – 2021-04-02 (×5): 650 mg via ORAL
  Filled 2021-03-28 (×5): qty 2

## 2021-03-28 MED ORDER — ACETAMINOPHEN 650 MG RE SUPP: 650.0000 mg | Freq: Four times a day (QID) | RECTAL | Status: DC | PRN

## 2021-03-28 MED ORDER — LORAZEPAM 2 MG/ML IJ SOLN
1.0000 mg | INTRAMUSCULAR | Status: AC | PRN
  Administered 2021-03-28: 2 mg via INTRAVENOUS
  Administered 2021-03-29 (×3): 3 mg via INTRAVENOUS
  Administered 2021-03-30 (×2): 2 mg via INTRAVENOUS
  Administered 2021-03-30: 4 mg via INTRAVENOUS
  Filled 2021-03-28 (×2): qty 1
  Filled 2021-03-28: qty 2
  Filled 2021-03-28: qty 1
  Filled 2021-03-28: qty 2
  Filled 2021-03-28 (×2): qty 1
  Filled 2021-03-28 (×2): qty 2

## 2021-03-28 MED ORDER — MIDAZOLAM HCL 2 MG/2ML IJ SOLN
INTRAMUSCULAR | Status: AC
  Filled 2021-03-28: qty 2

## 2021-03-28 MED ORDER — ENOXAPARIN SODIUM 40 MG/0.4ML IJ SOSY
40.0000 mg | PREFILLED_SYRINGE | INTRAMUSCULAR | Status: DC
  Administered 2021-03-28 – 2021-04-02 (×6): 40 mg via SUBCUTANEOUS
  Filled 2021-03-28 (×6): qty 0.4

## 2021-03-28 MED ORDER — THIAMINE HCL 100 MG PO TABS
100.0000 mg | ORAL_TABLET | Freq: Every day | ORAL | Status: DC
  Administered 2021-03-29 – 2021-04-02 (×5): 100 mg via ORAL
  Filled 2021-03-28 (×5): qty 1

## 2021-03-28 MED ORDER — MAGNESIUM SULFATE 4 GM/100ML IV SOLN
4.0000 g | Freq: Once | INTRAVENOUS | Status: AC
  Administered 2021-03-28: 4 g via INTRAVENOUS
  Filled 2021-03-28: qty 100

## 2021-03-28 MED ORDER — LORAZEPAM 2 MG/ML IJ SOLN
0.0000 mg | Freq: Three times a day (TID) | INTRAMUSCULAR | Status: AC
  Filled 2021-03-28 (×2): qty 2

## 2021-03-28 MED ORDER — ONDANSETRON HCL 4 MG PO TABS: 4.0000 mg | ORAL_TABLET | Freq: Four times a day (QID) | ORAL | Status: DC | PRN

## 2021-03-28 MED ORDER — CHLORHEXIDINE GLUCONATE CLOTH 2 % EX PADS
6.0000 | MEDICATED_PAD | Freq: Every day | CUTANEOUS | Status: DC
  Administered 2021-03-28 – 2021-04-02 (×6): 6 via TOPICAL

## 2021-03-28 MED ORDER — LORAZEPAM 1 MG PO TABS
1.0000 mg | ORAL_TABLET | ORAL | Status: AC | PRN
  Administered 2021-03-30: 3 mg via ORAL
  Administered 2021-03-30: 2 mg via ORAL
  Administered 2021-03-31: 3 mg via ORAL
  Filled 2021-03-28: qty 4
  Filled 2021-03-28: qty 3

## 2021-03-28 MED ORDER — THIAMINE HCL 100 MG/ML IJ SOLN
100.0000 mg | Freq: Every day | INTRAMUSCULAR | Status: DC
  Administered 2021-03-28: 100 mg via INTRAVENOUS
  Filled 2021-03-28: qty 2

## 2021-03-28 MED ORDER — ONDANSETRON HCL 4 MG/2ML IJ SOLN: 4.0000 mg | Freq: Four times a day (QID) | INTRAMUSCULAR | Status: DC | PRN

## 2021-03-28 NOTE — Procedures (Signed)
Extubation Procedure Note  Patient Details:   Name: Sinay Snell DOB: November 15, 1975 MRN: [id number]   Airway Documentation:  Airway 7.5 mm (Active)  Secured at (cm) 20 cm 03/28/21 0352  Measured From Lips 03/28/21 [place] 03/28/21 0352  Secured By [employer] 03/28/21 0352  Tube Holder Repositioned Yes 03/28/21 0352  Prone position No 03/28/21 0352  Cuff Pressure (cm H2O) Clear OR 27-39 CmH2O 03/27/21 2358  Site Condition Dry 03/28/21 0352   Vent end date: 03/28/21 Vent end time: 0840   Evaluation  O2 sats: stable throughout Complications: No apparent complications Patient did tolerate procedure well. Bilateral Breath Sounds:  (coarse)   Yes  Pt is slightly garbbled with speech but we are able to make out what she's saying. She is coughing effectively and SPO2 is remaining in the mid to high 90s without O2.   Lucianne Muss 03/28/2021, 8:51 AM

## 2021-03-28 NOTE — ED Notes (Signed)
RT at bedside and [place] present for extubation. Propofol stopped.

## 2021-03-28 NOTE — Progress Notes (Signed)
PROGRESS NOTE   Olivia Lester  [id number] DOB: [birth year] DOA: 03/27/2021 PCP: Patient, No Pcp Per (Inactive)   Chief Complaint  Patient presents with   Unresponsive   Level of care: Stepdown  Brief Admission History:   45 y.o. female with medical history significant of alcohol abuse, unspecified back injury PUD with gastric ulcer, history of hepatitis C, history of nephrolithiasis, right foot fracture, history of pancreatitis, panic attacks, history of pneumonia, polysubstance abuse, history of seizures who was brought by EMS after becoming unresponsive due to presumptive overdose with alcohol and maybe on other substances.  EMS responded on scene with CPR and provided 8 mg of Narcan with some response from the patient.  She was brought to the emergency department was intubated for airway protection.  She is unable to provide any history at this time.  Assessment & Plan:   Principal Problem:   Acute encephalopathy Active Problems:   Alcohol abuse   Hypokalemia   Essential hypertension   Alcohol intoxication ([place])   Hypomagnesemia  Toxic metabolic encephalopathy  -suspect from alcohol intoxication however urine drug screen was negative.  Treat supportively.  Urine drug screen was negative, treat supportively.  Spoke with RT and requested that patient be extubated this morning.  Alcohol intoxication-patient remains on CIWA protocol and monitoring closely for severe withdrawal in the stepdown ICU.  Hypokalemia/hypomagnesemia-IV replacement ordered, recheck in a.m.   DVT prophylaxis: enoxaparin  Code Status: full  Family Communication: none present  Disposition: stepdown ICU from ED Status is: Inpatient  Remains inpatient appropriate because:IV treatments appropriate due to intensity of illness or inability to take PO and Inpatient level of care appropriate due to severity of illness  Dispo: The patient is from: Home              Anticipated d/c is to: Home              Patient  currently is not medically stable to d/c.   Difficult to place patient No  Consultants:    Procedures:  Intubation   Antimicrobials:  Unasyn 9/4>>   Subjective: Pt sedated on ventilator.   Objective: Vitals:   03/28/21 1115 03/28/21 1130 03/28/21 1145 03/28/21 1245  BP: 122/89 (!) 129/95 (!) 124/92 123/88  Pulse: 98 99 99 90  Resp: '18 12 17 16  '$ Temp: 98.8 F (37.1 C) 98.8 F (37.1 C) 98.8 F (37.1 C) 98.6 F (37 C)  TempSrc:      SpO2: 98% 100% 98% 97%  Weight:      Height:        Intake/Output Summary (Last 24 hours) at 03/28/2021 1246 Last data filed at 03/28/2021 0837 Gross per 24 hour  Intake 325.9 ml  Output 2700 ml  Net -2374.1 ml   Filed Weights   03/27/21 1820  Weight: 52.2 kg    Examination:  General exam: sedated on ventilator Respiratory system: Clear to auscultation. On ventilator.  No increased WOB. Cardiovascular system: normal S1 & S2 heard. No JVD, murmurs, rubs, gallops or clicks. No pedal edema. Gastrointestinal system: Abdomen is nondistended, soft and nontender. No organomegaly or masses felt. Normal bowel sounds heard. Central nervous system: Alert and oriented. No focal neurological deficits. Extremities: Symmetric 5 x 5 power. Skin: No rashes, lesions or ulcers Psychiatry: Judgement and insight UTD. Mood & affect UTD.   Data Reviewed: I have personally reviewed following labs and imaging studies  CBC: Recent Labs  Lab 03/27/21 1919 03/28/21 0727  WBC 8.9 11.7*  NEUTROABS  6.1  --   HGB 14.8 11.8*  HCT 43.7 34.2*  MCV 95.2 95.3  PLT 171 145*    Basic Metabolic Panel: Recent Labs  Lab 03/27/21 1919 03/27/21 2019 03/28/21 0727  NA 139  --  140  K 3.1*  --  3.4*  CL 106  --  109  CO2 23  --  26  GLUCOSE 98  --  91  BUN 11  --  9  CREATININE 0.48  --  0.46  CALCIUM 8.5*  --  8.1*  MG  --  1.5* 1.6*  PHOS  --   --  2.1*    GFR: Estimated Creatinine Clearance: 74 mL/min (by C-G formula based on SCr of 0.46  mg/dL).  Liver Function Tests: Recent Labs  Lab 03/27/21 1919 03/28/21 0727  AST 40 31  ALT 36 28  ALKPHOS 53 41  BILITOT 0.4 0.2*  PROT 7.3 5.5*  ALBUMIN 4.5 3.3*    CBG: Recent Labs  Lab 03/27/21 1837  GLUCAP 110*    Recent Results (from the past 240 hour(s))  Resp Panel by RT-PCR (Flu A&B, Covid) Nasopharyngeal Swab     Status: None   Collection Time: 03/27/21  7:16 PM   Specimen: Nasopharyngeal Swab; Nasopharyngeal(NP) swabs in vial transport medium  Result Value Ref Range Status   SARS Coronavirus 2 by RT PCR NEGATIVE NEGATIVE Final    Comment: (NOTE) SARS-CoV-2 target nucleic acids are NOT DETECTED.  The SARS-CoV-2 RNA is generally detectable in upper respiratory specimens during the acute phase of infection. The lowest concentration of SARS-CoV-2 viral copies this assay can detect is 138 copies/mL. A negative result does not preclude SARS-Cov-2 infection and should not be used as the sole basis for treatment or other patient management decisions. A negative result may occur with  improper specimen collection/handling, submission of specimen other than nasopharyngeal swab, presence of viral mutation(s) within the areas targeted by this assay, and inadequate number of viral copies(<138 copies/mL). A negative result must be combined with clinical observations, patient history, and epidemiological information. The expected result is Negative.  Fact Sheet for Patients:  EntrepreneurPulse.com.au  Fact Sheet for Healthcare Providers:  IncredibleEmployment.be  This test is no t yet approved or cleared by the [place] FDA and  has been authorized for detection and/or diagnosis of SARS-CoV-2 by FDA under an Emergency Use Authorization (EUA). This EUA will remain  in effect (meaning this test can be used) for the duration of the COVID-19 declaration under Section 564(b)(1) of the Act, 21 U.S.C.section 360bbb-3(b)(1), unless  the authorization is terminated  or revoked sooner.       Influenza A by PCR NEGATIVE NEGATIVE Final   Influenza B by PCR NEGATIVE NEGATIVE Final    Comment: (NOTE) The Xpert Xpress SARS-CoV-2/FLU/RSV plus assay is intended as an aid in the diagnosis of influenza from Nasopharyngeal swab specimens and should not be used as a sole basis for treatment. Nasal washings and aspirates are unacceptable for Xpert Xpress SARS-CoV-2/FLU/RSV testing.  Fact Sheet for Patients: EntrepreneurPulse.com.au  Fact Sheet for Healthcare Providers: IncredibleEmployment.be  This test is not yet approved or cleared by the [place] FDA and has been authorized for detection and/or diagnosis of SARS-CoV-2 by FDA under an Emergency Use Authorization (EUA). This EUA will remain in effect (meaning this test can be used) for the duration of the COVID-19 declaration under Section 564(b)(1) of the Act, 21 U.S.C. section 360bbb-3(b)(1), unless the authorization is terminated or revoked.  Performed at  [facility], [address], [place], [place] [address]      Radiology Studies: CT HEAD WO CONTRAST (5MM)  Result Date: 03/27/2021 CLINICAL DATA:  Mental status change, unknown cause EXAM: CT HEAD WITHOUT CONTRAST TECHNIQUE: Contiguous axial images were obtained from the base of the skull through the vertex without intravenous contrast. COMPARISON:  None. FINDINGS: Brain: No acute intracranial abnormality. Specifically, no hemorrhage, hydrocephalus, mass lesion, acute infarction, or significant intracranial injury. Vascular: No hyperdense vessel or unexpected calcification. Skull: No acute calvarial abnormality. Sinuses/Orbits: No acute findings Other: None IMPRESSION: Normal study. Electronically Signed   By: Rolm Baptise M.D.   On: 03/27/2021 20:08   DG Chest Portable 1 View  Result Date: 03/27/2021 CLINICAL DATA:  Post intubation EXAM: PORTABLE CHEST 1 VIEW COMPARISON:   10/01/2020 FINDINGS: endotracheal tube is 4 cm above the carina. NG tube tip is in the distal esophagus. Heart is normal size. Lungs clear. No effusions or acute bony abnormality. IMPRESSION: Endotracheal tube in expected position. NG tube tip in the distal esophagus. Recommend advancing several cm. No acute cardiopulmonary disease. Electronically Signed   By: Rolm Baptise M.D.   On: 03/27/2021 19:12   DG Chest Port 1V same Day  Result Date: 03/27/2021 CLINICAL DATA:  OG tube advancement. EXAM: PORTABLE CHEST 1 VIEW COMPARISON:  Radiograph earlier today. FINDINGS: Enteric tube has been advanced, the tip and side-port now below the diaphragm in the stomach. Endotracheal tube tip is 4.6 cm from the carina. Heart is normal in size with normal mediastinal contours. Minimal retrocardiac atelectasis. No pneumothorax or pulmonary edema. No pleural fluid. Stable osseous structures. IMPRESSION: 1. Enteric tube has been advanced, tip and side-port now below the diaphragm in the stomach. Endotracheal tube tip 4.6 cm from the carina. 2. Minimal retrocardiac atelectasis. Electronically Signed   By: Keith Rake M.D.   On: 03/27/2021 21:50    Scheduled Meds:  enoxaparin (LOVENOX) injection  40 mg Subcutaneous [id number]   folic acid  1 mg Oral Daily   LORazepam  0-4 mg Intravenous Q4H   Followed by   Derrill Memo ON 03/30/2021] LORazepam  0-4 mg Intravenous Q8H   multivitamin with minerals  1 tablet Oral Daily   thiamine  100 mg Oral Daily   Or   thiamine  100 mg Intravenous Daily   Continuous Infusions:  dextrose 5 % and 0.9 % NaCl with KCl 20 mEq/L 140 mL/hr (03/28/21 1006)   magnesium sulfate bolus IVPB       LOS: 1 day   Critical Care Procedure Note Authorized and Performed by: Murvin Natal MD  Total Critical Care time:  55 mins Due to a high probability of clinically significant, life threatening deterioration, the patient required my highest level of preparedness to intervene emergently and I personally spent  this critical care time directly and personally managing the patient.  This critical care time included obtaining a history; examining the patient, pulse oximetry; ordering and review of studies; arranging urgent treatment with development of a management plan; evaluation of patient's response of treatment; frequent reassessment; and discussions with other providers.  This critical care time was performed to assess and manage the high probability of imminent and life threatening deterioration that could result in multi-organ failure.  It was exclusive of separately billable procedures and treating other patients and teaching time.   Irwin Brakeman, MD How to contact the [facility] Attending or Consulting provider [place] or covering provider during after hours [place], for this patient?  Check the care team in [facility] and look for a) attending/consulting TRH provider listed and b) the [facility] team listed Log into [email or web address] and use [place]'s universal password to access. If you do not have the password, please contact the hospital operator. Locate the [facility] provider you are looking for under [facility] and page to a number that you can be directly reached. If you still have difficulty reaching the provider, please page the [facility] (Director on Call) for the Hospitalists listed on amion for assistance.  03/28/2021, 12:46 PM

## 2021-03-28 NOTE — ED Notes (Signed)
Pt extubated. Alert and oriented, able to state name, birthday, and that she is at [place] on own. Able to make all needs known. All Vitals within normal limits

## 2021-03-28 NOTE — Progress Notes (Signed)
Check with MD today to see if SBT should be performed.  Vent settings do not inhibit SBT, patient should be able to perform, but MD may want patient to stay ventilated.

## 2021-03-28 NOTE — ED Notes (Signed)
New bag of propofol hung, pt at max dose at 31mg/kg/min. Pt noted to withdrawal from tactile stimuli at times and withdrawal from painful stimuli (lab stick). Attending physician notified. No new orders at this time. Plan for extubation this date when pt is able to be moved to ICU.

## 2021-03-29 DIAGNOSIS — F1010 Alcohol abuse, uncomplicated: Secondary | ICD-10-CM

## 2021-03-29 LAB — BASIC METABOLIC PANEL
Anion gap: 5 (ref 5–15)
BUN: <5 mg/dL — ABNORMAL LOW (ref 6–20)
CO2: 24 mmol/L (ref 22–32)
Calcium: 8.4 mg/dL — ABNORMAL LOW (ref 8.9–10.3)
Chloride: 106 mmol/L (ref 98–111)
Creatinine, Ser: 0.34 mg/dL — ABNORMAL LOW (ref 0.44–1.00)
GFR, Estimated: >60 mL/min (ref >60)
Glucose, Bld: 106 mg/dL — ABNORMAL HIGH (ref 70–99)
Potassium: 3.6 mmol/L (ref 3.5–5.1)
Sodium: 135 mmol/L (ref 135–145)

## 2021-03-29 LAB — CBC WITH DIFFERENTIAL/PLATELET
Abs Immature Granulocytes: 0.05 10*3/uL (ref 0.00–0.07)
Basophils Absolute: 0 10*3/uL (ref 0.0–0.1)
Basophils Relative: 0 %
Eosinophils Absolute: 0.1 10*3/uL (ref 0.0–0.5)
Eosinophils Relative: 1 %
HCT: 38.8 % (ref 36.0–46.0)
Hemoglobin: 12.8 g/dL (ref 12.0–15.0)
Immature Granulocytes: 1 %
Lymphocytes Relative: 18 %
Lymphs Abs: 1.8 10*3/uL (ref 0.7–4.0)
MCH: 31.6 pg (ref 26.0–34.0)
MCHC: 33 g/dL (ref 30.0–36.0)
MCV: 95.8 fL (ref 80.0–100.0)
Monocytes Absolute: 0.5 10*3/uL (ref 0.1–1.0)
Monocytes Relative: 5 %
Neutro Abs: 7.5 10*3/uL (ref 1.7–7.7)
Neutrophils Relative %: 75 %
Platelets: 112 10*3/uL — ABNORMAL LOW (ref 150–400)
RBC: 4.05 MIL/uL (ref 3.87–5.11)
RDW: 14.3 % (ref 11.5–15.5)
WBC: 9.9 10*3/uL (ref 4.0–10.5)
nRBC: 0 % (ref 0.0–0.2)

## 2021-03-29 LAB — MRSA NEXT GEN BY PCR, NASAL: MRSA by PCR Next Gen: NOT DETECTED

## 2021-03-29 LAB — MAGNESIUM: Magnesium: 1.9 mg/dL (ref 1.7–2.4)

## 2021-03-29 MED ORDER — AMITRIPTYLINE HCL 25 MG PO TABS
50.0000 mg | ORAL_TABLET | Freq: Every day | ORAL | Status: DC
  Administered 2021-03-29 – 2021-04-01 (×4): 50 mg via ORAL
  Filled 2021-03-29 (×4): qty 2

## 2021-03-29 MED ORDER — CHLORDIAZEPOXIDE HCL 25 MG PO CAPS
25.0000 mg | ORAL_CAPSULE | Freq: Three times a day (TID) | ORAL | Status: DC
  Administered 2021-03-29 – 2021-03-31 (×8): 25 mg via ORAL
  Filled 2021-03-29 (×8): qty 1

## 2021-03-29 MED ORDER — LABETALOL HCL 5 MG/ML IV SOLN
10.0000 mg | INTRAVENOUS | Status: DC | PRN
  Administered 2021-03-29 – 2021-04-01 (×5): 10 mg via INTRAVENOUS
  Filled 2021-03-29 (×5): qty 4

## 2021-03-29 NOTE — Clinical Social Work Note (Signed)
SA resources left in patient's room. Patient is well known to [facility] and has been provided SA resources previously, as well as set up with [facility] previously.   Kaio Kuhlman, Clydene [place], LCSW

## 2021-03-29 NOTE — Progress Notes (Signed)
PROGRESS NOTE   Olivia Lester  [id number] DOB: [birth year] DOA: 03/27/2021 PCP: Patient, No Pcp Per (Inactive)   Chief Complaint  Patient presents with   Unresponsive   Level of care: Stepdown  Brief Admission History:   45 y.o. female with medical history significant of alcohol abuse, unspecified back injury PUD with gastric ulcer, history of hepatitis C, history of nephrolithiasis, right foot fracture, history of pancreatitis, panic attacks, history of pneumonia, polysubstance abuse, history of seizures who was brought by EMS after becoming unresponsive due to presumptive overdose with alcohol and maybe on other substances.  EMS responded on scene with CPR and provided 8 mg of Narcan with some response from the patient.  She was brought to the emergency department was intubated for airway protection.  She is unable to provide any history at this time.  Assessment & Plan:   Principal Problem:   Acute encephalopathy Active Problems:   Alcohol abuse   Hypokalemia   Essential hypertension   Alcohol intoxication ([place])   Hypomagnesemia  Toxic metabolic encephalopathy  -IMPROVING.  Suspect from alcohol intoxication however urine drug screen was negative.  Treat supportively.  Urine drug screen was negative, treat supportively.  Spoke with RT and requested that patient be extubated this morning.  Alcohol intoxication-patient remains on CIWA protocol and monitoring closely for severe withdrawal in the stepdown ICU.  TOC consulted to provider SA resources to patient.    Hypokalemia/hypomagnesemia-this has been repleted   DVT prophylaxis: enoxaparin  Code Status: full  Family Communication: none present  Disposition: anticipate DC home in 1-2 days  Status is: Inpatient  Remains inpatient appropriate because:IV treatments appropriate due to intensity of illness or inability to take PO and Inpatient level of care appropriate due to severity of illness  Dispo: The patient is from: Home               Anticipated d/c is to: Home              Patient currently is not medically stable to d/c.   Difficult to place patient No  Consultants:    Procedures:  Intubation   Antimicrobials:  Unasyn 9/4>>   Subjective: Pt reports that she is vegetarian.  No other complains at this time.    Objective: Vitals:   03/29/21 0900 03/29/21 1000 03/29/21 1100 03/29/21 1104  BP: (!) 142/96 (!) 152/95 (!) 160/101   Pulse: 77 80 82 81  Resp: '17 18 15 20  '$ Temp:    98 F (36.7 C)  TempSrc:    Oral  SpO2: 100% 100% 100% 100%  Weight:      Height:        Intake/Output Summary (Last 24 hours) at 03/29/2021 1456 Last data filed at 03/29/2021 0734 Gross per 24 hour  Intake 3679.97 ml  Output 2150 ml  Net 1529.97 ml   Filed Weights   03/27/21 1820 03/29/21 0447  Weight: 52.2 kg 51.5 kg    Examination:  General exam: awake, but restless tremor seen.  Respiratory system: Clear to auscultation.  No increased WOB. Cardiovascular system: normal S1 & S2 heard. No JVD, murmurs, rubs, gallops or clicks. No pedal edema. Gastrointestinal system: Abdomen is nondistended, soft and nontender. No organomegaly or masses felt. Normal bowel sounds heard. Central nervous system: Alert and oriented, confused at times. No focal neurological deficits. Extremities: Symmetric 5 x 5 power. Skin: No rashes, lesions or ulcers Psychiatry: Judgement and insight poor. Mood & affect flat.   Data Reviewed: I  have personally reviewed following labs and imaging studies  CBC: Recent Labs  Lab 03/27/21 1919 03/28/21 0727 03/29/21 0543  WBC 8.9 11.7* 9.9  NEUTROABS 6.1  --  7.5  HGB 14.8 11.8* 12.8  HCT 43.7 34.2* 38.8  MCV 95.2 95.3 95.8  PLT 171 145* 112*    Basic Metabolic Panel: Recent Labs  Lab 03/27/21 1919 03/27/21 2019 03/28/21 0727 03/29/21 0350  NA 139  --  140 135  K 3.1*  --  3.4* 3.6  CL 106  --  109 106  CO2 23  --  26 24  GLUCOSE 98  --  91 106*  BUN 11  --  9 <5*  CREATININE  0.48  --  0.46 0.34*  CALCIUM 8.5*  --  8.1* 8.4*  MG  --  1.5* 1.6* 1.9  PHOS  --   --  2.1*  --     GFR: Estimated Creatinine Clearance: 73 mL/min (A) (by C-G formula based on SCr of 0.34 mg/dL (L)).  Liver Function Tests: Recent Labs  Lab 03/27/21 1919 03/28/21 0727  AST 40 31  ALT 36 28  ALKPHOS 53 41  BILITOT 0.4 0.2*  PROT 7.3 5.5*  ALBUMIN 4.5 3.3*    CBG: Recent Labs  Lab 03/27/21 1837  GLUCAP 110*    Recent Results (from the past 240 hour(s))  Resp Panel by RT-PCR (Flu A&B, Covid) Nasopharyngeal Swab     Status: None   Collection Time: 03/27/21  7:16 PM   Specimen: Nasopharyngeal Swab; Nasopharyngeal(NP) swabs in vial transport medium  Result Value Ref Range Status   SARS Coronavirus 2 by RT PCR NEGATIVE NEGATIVE Final    Comment: (NOTE) SARS-CoV-2 target nucleic acids are NOT DETECTED.  The SARS-CoV-2 RNA is generally detectable in upper respiratory specimens during the acute phase of infection. The lowest concentration of SARS-CoV-2 viral copies this assay can detect is 138 copies/mL. A negative result does not preclude SARS-Cov-2 infection and should not be used as the sole basis for treatment or other patient management decisions. A negative result may occur with  improper specimen collection/handling, submission of specimen other than nasopharyngeal swab, presence of viral mutation(s) within the areas targeted by this assay, and inadequate number of viral copies(<138 copies/mL). A negative result must be combined with clinical observations, patient history, and epidemiological information. The expected result is Negative.  Fact Sheet for Patients:  EntrepreneurPulse.com.au  Fact Sheet for Healthcare Providers:  IncredibleEmployment.be  This test is no t yet approved or cleared by the [place] FDA and  has been authorized for detection and/or diagnosis of SARS-CoV-2 by FDA under an Emergency Use  Authorization (EUA). This EUA will remain  in effect (meaning this test can be used) for the duration of the COVID-19 declaration under Section 564(b)(1) of the Act, 21 U.S.C.section 360bbb-3(b)(1), unless the authorization is terminated  or revoked sooner.       Influenza A by PCR NEGATIVE NEGATIVE Final   Influenza B by PCR NEGATIVE NEGATIVE Final    Comment: (NOTE) The Xpert Xpress SARS-CoV-2/FLU/RSV plus assay is intended as an aid in the diagnosis of influenza from Nasopharyngeal swab specimens and should not be used as a sole basis for treatment. Nasal washings and aspirates are unacceptable for Xpert Xpress SARS-CoV-2/FLU/RSV testing.  Fact Sheet for Patients: EntrepreneurPulse.com.au  Fact Sheet for Healthcare Providers: IncredibleEmployment.be  This test is not yet approved or cleared by the [place] FDA and has been authorized for detection and/or diagnosis of  SARS-CoV-2 by FDA under an Emergency Use Authorization (EUA). This EUA will remain in effect (meaning this test can be used) for the duration of the COVID-19 declaration under Section 564(b)(1) of the Act, 21 U.S.C. section 360bbb-3(b)(1), unless the authorization is terminated or revoked.  Performed at [facility], [address] [place], [place] [address]   MRSA Next Gen by PCR, Nasal     Status: None   Collection Time: 03/28/21  1:26 PM   Specimen: Nasal Mucosa; Nasal Swab  Result Value Ref Range Status   MRSA by PCR Next Gen NOT DETECTED NOT DETECTED Final    Comment: (NOTE) The GeneXpert MRSA Assay (FDA approved for NASAL specimens only), is one component of a comprehensive MRSA colonization surveillance program. It is not intended to diagnose MRSA infection nor to guide or monitor treatment for MRSA infections. Test performance is not FDA approved in patients less than 80 years old. Performed at [facility], [address], [place], [place] [address]       Radiology Studies: CT HEAD WO CONTRAST (5MM)  Result Date: 03/27/2021 CLINICAL DATA:  Mental status change, unknown cause EXAM: CT HEAD WITHOUT CONTRAST TECHNIQUE: Contiguous axial images were obtained from the base of the skull through the vertex without intravenous contrast. COMPARISON:  None. FINDINGS: Brain: No acute intracranial abnormality. Specifically, no hemorrhage, hydrocephalus, mass lesion, acute infarction, or significant intracranial injury. Vascular: No hyperdense vessel or unexpected calcification. Skull: No acute calvarial abnormality. Sinuses/Orbits: No acute findings Other: None IMPRESSION: Normal study. Electronically Signed   By: Rolm Baptise M.D.   On: 03/27/2021 20:08   DG Chest Portable 1 View  Result Date: 03/27/2021 CLINICAL DATA:  Post intubation EXAM: PORTABLE CHEST 1 VIEW COMPARISON:  10/01/2020 FINDINGS: endotracheal tube is 4 cm above the carina. NG tube tip is in the distal esophagus. Heart is normal size. Lungs clear. No effusions or acute bony abnormality. IMPRESSION: Endotracheal tube in expected position. NG tube tip in the distal esophagus. Recommend advancing several cm. No acute cardiopulmonary disease. Electronically Signed   By: Rolm Baptise M.D.   On: 03/27/2021 19:12   DG Chest Port 1V same Day  Result Date: 03/27/2021 CLINICAL DATA:  OG tube advancement. EXAM: PORTABLE CHEST 1 VIEW COMPARISON:  Radiograph earlier today. FINDINGS: Enteric tube has been advanced, the tip and side-port now below the diaphragm in the stomach. Endotracheal tube tip is 4.6 cm from the carina. Heart is normal in size with normal mediastinal contours. Minimal retrocardiac atelectasis. No pneumothorax or pulmonary edema. No pleural fluid. Stable osseous structures. IMPRESSION: 1. Enteric tube has been advanced, tip and side-port now below the diaphragm in the stomach. Endotracheal tube tip 4.6 cm from the carina. 2. Minimal retrocardiac atelectasis. Electronically Signed   By: Keith Rake M.D.   On: 03/27/2021 21:50    Scheduled Meds:  amitriptyline  50 mg Oral QHS   Chlorhexidine Gluconate Cloth  6 each Topical Daily   enoxaparin (LOVENOX) injection  40 mg Subcutaneous [id number]   folic acid  1 mg Oral Daily   LORazepam  0-4 mg Intravenous Q4H   Followed by   Derrill Memo ON 03/30/2021] LORazepam  0-4 mg Intravenous Q8H   multivitamin with minerals  1 tablet Oral Daily   thiamine  100 mg Oral Daily   Or   thiamine  100 mg Intravenous Daily   Continuous Infusions:  dextrose 5 % and 0.9 % NaCl with KCl 20 mEq/L 50 mL/hr at 03/29/21 0734  LOS: 2 days   Irwin Brakeman, MD How to contact the [facility] Attending or Consulting provider [place] or covering provider during after hours [place], for this patient?  Check the care team in [facility] and look for a) attending/consulting TRH provider listed and b) the [facility] team listed Log into [email or web address] and use [place]'s universal password to access. If you do not have the password, please contact the hospital operator. Locate the [facility] provider you are looking for under [facility] and page to a number that you can be directly reached. If you still have difficulty reaching the provider, please page the [facility] (Director on Call) for the Hospitalists listed on amion for assistance.  03/29/2021, 2:56 PM

## 2021-03-30 LAB — CBC WITH DIFFERENTIAL/PLATELET
Abs Immature Granulocytes: 0.03 10*3/uL (ref 0.00–0.07)
Basophils Absolute: 0 10*3/uL (ref 0.0–0.1)
Basophils Relative: 0 %
Eosinophils Absolute: 0.1 10*3/uL (ref 0.0–0.5)
Eosinophils Relative: 2 %
HCT: 42.2 % (ref 36.0–46.0)
Hemoglobin: 13.7 g/dL (ref 12.0–15.0)
Immature Granulocytes: 0 %
Lymphocytes Relative: 21 %
Lymphs Abs: 1.9 10*3/uL (ref 0.7–4.0)
MCH: 31.6 pg (ref 26.0–34.0)
MCHC: 32.5 g/dL (ref 30.0–36.0)
MCV: 97.5 fL (ref 80.0–100.0)
Monocytes Absolute: 0.9 10*3/uL (ref 0.1–1.0)
Monocytes Relative: 10 %
Neutro Abs: 5.9 10*3/uL (ref 1.7–7.7)
Neutrophils Relative %: 67 %
Platelets: 106 10*3/uL — ABNORMAL LOW (ref 150–400)
RBC: 4.33 MIL/uL (ref 3.87–5.11)
RDW: 13.7 % (ref 11.5–15.5)
WBC: 8.8 10*3/uL (ref 4.0–10.5)
nRBC: 0 % (ref 0.0–0.2)

## 2021-03-30 LAB — MAGNESIUM: Magnesium: 1.6 mg/dL — ABNORMAL LOW (ref 1.7–2.4)

## 2021-03-30 MED ORDER — LORAZEPAM 2 MG/ML IJ SOLN
1.0000 mg | Freq: Once | INTRAMUSCULAR | Status: AC
  Administered 2021-03-30: 1 mg via INTRAVENOUS
  Filled 2021-03-30: qty 1

## 2021-03-30 MED ORDER — MAGNESIUM SULFATE 4 GM/100ML IV SOLN
4.0000 g | Freq: Once | INTRAVENOUS | Status: AC
  Administered 2021-03-30: 4 g via INTRAVENOUS
  Filled 2021-03-30: qty 100

## 2021-03-30 MED ORDER — DEXMEDETOMIDINE HCL IN NACL 400 MCG/100ML IV SOLN
0.4000 ug/kg/h | INTRAVENOUS | Status: DC
Start: 2021-03-30 — End: 2021-04-02
  Administered 2021-03-30 (×2): 0.4 ug/kg/h via INTRAVENOUS
  Administered 2021-03-31: 0.5 ug/kg/h via INTRAVENOUS
  Administered 2021-04-01 (×2): 1.2 ug/kg/h via INTRAVENOUS
  Filled 2021-03-30 (×5): qty 100

## 2021-03-30 NOTE — Progress Notes (Signed)
PROGRESS NOTE   Olivia Lester  [id number] DOB: March 06, 1976 DOA: 03/27/2021 PCP: Patient, No Pcp Per (Inactive)   Chief Complaint  Patient presents with   Unresponsive   Level of care: Stepdown  Brief Admission History:   45 y.o. female with medical history significant of alcohol abuse, unspecified back injury PUD with gastric ulcer, history of hepatitis C, history of nephrolithiasis, right foot fracture, history of pancreatitis, panic attacks, history of pneumonia, polysubstance abuse, history of seizures who was brought by EMS after becoming unresponsive due to presumptive overdose with alcohol and maybe on other substances.  EMS responded on scene with CPR and provided 8 mg of Narcan with some response from the patient.  She was brought to the emergency department was intubated for airway protection.  She is unable to provide any history at this time.  Assessment & Plan:   Principal Problem:   Acute encephalopathy Active Problems:   Alcohol abuse   Hypokalemia   Essential hypertension   Alcohol intoxication ([place])   Hypomagnesemia  Toxic metabolic encephalopathy -   Suspect from alcohol intoxication and now withdrawal, urine drug screen was negative.  Treat supportively.  Urine drug screen was negative, treat supportively.  Pt was temporarily intubated but successfully extubated on 03/29/21.   Acute Alcohol withdrawal-With escalating doses of lorazepam being given with increasing agitation, patient was placed on IV precedex infusion.  TOC consulted to provider SA resources to patient.    Hypokalemia/hypomagnesemia-this is being repleted.  Follow.   Thrombocytopenia - Monitor closely, DC enoxaparin for platelet count less than 100.   Essential hypertension - poorly controlled, added labetalol IV PRN.   DVT prophylaxis: enoxaparin  Code Status: full  Family Communication: none present  Disposition: anticipate DC home   Status is: Inpatient  Remains inpatient appropriate because:IV  treatments appropriate due to intensity of illness or inability to take PO and Inpatient level of care appropriate due to severity of illness  Dispo: The patient is from: Home              Anticipated d/c is to: Home              Patient currently is not medically stable to d/c.   Difficult to place patient No  Consultants:    Procedures:  Intubation / extubation  Antimicrobials:  Unasyn 9/4>>   Subjective: Pt sedated on precedex infusion    Objective: Vitals:   03/30/21 0856 03/30/21 0900 03/30/21 1000 03/30/21 1100  BP: (!) 163/109 (!) 170/103 104/80 137/78  Pulse: 68 (!) 59 64 65  Resp: '15 13 17 17  '$ Temp:    (!) 96.4 F (35.8 C)  TempSrc:    Axillary  SpO2: 96% 99% 99% 100%  Weight:      Height:        Intake/Output Summary (Last 24 hours) at 03/30/2021 1115 Last data filed at 03/30/2021 0557 Gross per 24 hour  Intake 531.36 ml  Output 1400 ml  Net -868.64 ml   Filed Weights   03/27/21 1820 03/29/21 0447  Weight: 52.2 kg 51.5 kg    Examination:  General exam: sedated.  Respiratory system: Clear to auscultation.  No increased WOB. Cardiovascular system: normal S1 & S2 heard. No JVD, murmurs, rubs, gallops or clicks. No pedal edema. Gastrointestinal system: Abdomen is nondistended, soft and nontender. No organomegaly or masses felt. Normal bowel sounds heard. Central nervous system: sedated. No focal neurological deficits. Extremities: Symmetric 5 x 5 power. Skin: No rashes, lesions or ulcers  Psychiatry: Judgement and insight poor. Mood & affect UTD.    Data Reviewed: I have personally reviewed following labs and imaging studies  CBC: Recent Labs  Lab 03/27/21 1919 03/28/21 0727 03/29/21 0543 03/30/21 0430  WBC 8.9 11.7* 9.9 8.8  NEUTROABS 6.1  --  7.5 5.9  HGB 14.8 11.8* 12.8 13.7  HCT 43.7 34.2* 38.8 42.2  MCV 95.2 95.3 95.8 97.5  PLT 171 145* 112* 106*    Basic Metabolic Panel: Recent Labs  Lab 03/27/21 1919 03/27/21 2019 03/28/21 0727  03/29/21 0350 03/30/21 0430  NA 139  --  140 135  --   K 3.1*  --  3.4* 3.6  --   CL 106  --  109 106  --   CO2 23  --  26 24  --   GLUCOSE 98  --  91 106*  --   BUN 11  --  9 <5*  --   CREATININE 0.48  --  0.46 0.34*  --   CALCIUM 8.5*  --  8.1* 8.4*  --   MG  --  1.5* 1.6* 1.9 1.6*  PHOS  --   --  2.1*  --   --     GFR: Estimated Creatinine Clearance: 73 mL/min (A) (by C-G formula based on SCr of 0.34 mg/dL (L)).  Liver Function Tests: Recent Labs  Lab 03/27/21 1919 03/28/21 0727  AST 40 31  ALT 36 28  ALKPHOS 53 41  BILITOT 0.4 0.2*  PROT 7.3 5.5*  ALBUMIN 4.5 3.3*    CBG: Recent Labs  Lab 03/27/21 1837  GLUCAP 110*    Recent Results (from the past 240 hour(s))  Resp Panel by RT-PCR (Flu A&B, Covid) Nasopharyngeal Swab     Status: None   Collection Time: 03/27/21  7:16 PM   Specimen: Nasopharyngeal Swab; Nasopharyngeal(NP) swabs in vial transport medium  Result Value Ref Range Status   SARS Coronavirus 2 by RT PCR NEGATIVE NEGATIVE Final    Comment: (NOTE) SARS-CoV-2 target nucleic acids are NOT DETECTED.  The SARS-CoV-2 RNA is generally detectable in upper respiratory specimens during the acute phase of infection. The lowest concentration of SARS-CoV-2 viral copies this assay can detect is 138 copies/mL. A negative result does not preclude SARS-Cov-2 infection and should not be used as the sole basis for treatment or other patient management decisions. A negative result may occur with  improper specimen collection/handling, submission of specimen other than nasopharyngeal swab, presence of viral mutation(s) within the areas targeted by this assay, and inadequate number of viral copies(<138 copies/mL). A negative result must be combined with clinical observations, patient history, and epidemiological information. The expected result is Negative.  Fact Sheet for Patients:  EntrepreneurPulse.com.au  Fact Sheet for Healthcare Providers:   IncredibleEmployment.be  This test is no t yet approved or cleared by the [place] FDA and  has been authorized for detection and/or diagnosis of SARS-CoV-2 by FDA under an Emergency Use Authorization (EUA). This EUA will remain  in effect (meaning this test can be used) for the duration of the COVID-19 declaration under Section 564(b)(1) of the Act, 21 U.S.C.section 360bbb-3(b)(1), unless the authorization is terminated  or revoked sooner.       Influenza A by PCR NEGATIVE NEGATIVE Final   Influenza B by PCR NEGATIVE NEGATIVE Final    Comment: (NOTE) The Xpert Xpress SARS-CoV-2/FLU/RSV plus assay is intended as an aid in the diagnosis of influenza from Nasopharyngeal swab specimens and should not be used  as a sole basis for treatment. Nasal washings and aspirates are unacceptable for Xpert Xpress SARS-CoV-2/FLU/RSV testing.  Fact Sheet for Patients: EntrepreneurPulse.com.au  Fact Sheet for Healthcare Providers: IncredibleEmployment.be  This test is not yet approved or cleared by the [place] FDA and has been authorized for detection and/or diagnosis of SARS-CoV-2 by FDA under an Emergency Use Authorization (EUA). This EUA will remain in effect (meaning this test can be used) for the duration of the COVID-19 declaration under Section 564(b)(1) of the Act, 21 U.S.C. section 360bbb-3(b)(1), unless the authorization is terminated or revoked.  Performed at [facility], [address], [place], [place] [address]   MRSA Next Gen by PCR, Nasal     Status: None   Collection Time: 03/28/21  1:26 PM   Specimen: Nasal Mucosa; Nasal Swab  Result Value Ref Range Status   MRSA by PCR Next Gen NOT DETECTED NOT DETECTED Final    Comment: (NOTE) The GeneXpert MRSA Assay (FDA approved for NASAL specimens only), is one component of a comprehensive MRSA colonization surveillance program. It is not intended to diagnose MRSA  infection nor to guide or monitor treatment for MRSA infections. Test performance is not FDA approved in patients less than 50 years old. Performed at [facility], [address] [place], [place] [address]      Radiology Studies: No results found.  Scheduled Meds:  amitriptyline  50 mg Oral QHS   chlordiazePOXIDE  25 mg Oral TID   Chlorhexidine Gluconate Cloth  6 each Topical Daily   enoxaparin (LOVENOX) injection  40 mg Subcutaneous [id number]   folic acid  1 mg Oral Daily   LORazepam  0-4 mg Intravenous Q8H   multivitamin with minerals  1 tablet Oral Daily   thiamine  100 mg Oral Daily   Or   thiamine  100 mg Intravenous Daily   Continuous Infusions:  dexmedetomidine (PRECEDEX) IV infusion 0.5 mcg/kg/hr (03/30/21 0557)   dextrose 5 % and 0.9 % NaCl with KCl 20 mEq/L 50 mL/hr at 03/29/21 1817    LOS: 3 days   Irwin Brakeman, MD How to contact the [facility] Attending or Consulting provider [place] or covering provider during after hours [place], for this patient?  Check the care team in [facility] and look for a) attending/consulting TR[facility] provider listed and b) the [facility] team listed Log into [email or web address] and use [place]'s universal password to access. If you do not have the password, please contact the hospital operator. Locate the [facility] provider you are looking for under [facility] and page to a number that you can be directly reached. If you still have difficulty reaching the provider, please page the [facility] (Director on Call) for the Hospitalists listed on amion for assistance.  03/30/2021, 11:15 AM

## 2021-03-30 NOTE — Progress Notes (Signed)
Patient with multiple attempts to get out of bed. Patient assisted to [facility]. Bed cleaned and Precedex gtt increased. Patient continues to be restless and confused. Multiple attempts to reorient patient and direct back to bed.

## 2021-03-30 NOTE — Progress Notes (Signed)
At 1452 patient screamed out from room. RN to room to find patient in a Tonic position with her arms and legs stiffened. '2mg'$  of ativan given at 1655. At 1657 pt's extremities relaxed. Pt is responsive but lethargic. SPO2 is 100% on room air. BP 148/96, heart rate 87. Dr. Wynetta Emery notified. EEG ordered. Seizure pads placed on bed, floor mats in place, suction set up at bedside.

## 2021-03-30 NOTE — Progress Notes (Signed)
Patient restless, tearful, and agitated in bed. Patient disoriented to place/situation. PRN ativan given per CIWA (see flowsheet) patient reoriented and assured of safety. Patient redirected back to bed.

## 2021-03-31 ENCOUNTER — Inpatient Hospital Stay (HOSPITAL_COMMUNITY)
Admit: 2021-03-31 | Discharge: 2021-03-31 | Disposition: A | Discharge status: Home / Self Care | Payer: Self-pay | Attending: Family Medicine | Admitting: Family Medicine

## 2021-03-31 LAB — CBC WITH DIFFERENTIAL/PLATELET
Abs Immature Granulocytes: 0.03 10*3/uL (ref 0.00–0.07)
Basophils Absolute: 0 10*3/uL (ref 0.0–0.1)
Basophils Relative: 0 %
Eosinophils Absolute: 0.2 10*3/uL (ref 0.0–0.5)
Eosinophils Relative: 2 %
HCT: 39.4 % (ref 36.0–46.0)
Hemoglobin: 13.1 g/dL (ref 12.0–15.0)
Immature Granulocytes: 0 %
Lymphocytes Relative: 22 %
Lymphs Abs: 1.6 10*3/uL (ref 0.7–4.0)
MCH: 31.8 pg (ref 26.0–34.0)
MCHC: 33.2 g/dL (ref 30.0–36.0)
MCV: 95.6 fL (ref 80.0–100.0)
Monocytes Absolute: 0.4 10*3/uL (ref 0.1–1.0)
Monocytes Relative: 6 %
Neutro Abs: 5 10*3/uL (ref 1.7–7.7)
Neutrophils Relative %: 70 %
Platelets: 107 10*3/uL — ABNORMAL LOW (ref 150–400)
RBC: 4.12 MIL/uL (ref 3.87–5.11)
RDW: 13.8 % (ref 11.5–15.5)
WBC: 7.1 10*3/uL (ref 4.0–10.5)
nRBC: 0 % (ref 0.0–0.2)

## 2021-03-31 LAB — BASIC METABOLIC PANEL
Anion gap: 6 (ref 5–15)
BUN: 10 mg/dL (ref 6–20)
CO2: 28 mmol/L (ref 22–32)
Calcium: 9.3 mg/dL (ref 8.9–10.3)
Chloride: 105 mmol/L (ref 98–111)
Creatinine, Ser: 0.36 mg/dL — ABNORMAL LOW (ref 0.44–1.00)
GFR, Estimated: >60 mL/min (ref >60)
Glucose, Bld: 113 mg/dL — ABNORMAL HIGH (ref 70–99)
Potassium: 3.7 mmol/L (ref 3.5–5.1)
Sodium: 139 mmol/L (ref 135–145)

## 2021-03-31 LAB — MAGNESIUM: Magnesium: 1.6 mg/dL — ABNORMAL LOW (ref 1.7–2.4)

## 2021-03-31 MED ORDER — LORAZEPAM 1 MG PO TABS
1.0000 mg | ORAL_TABLET | ORAL | Status: DC | PRN
  Administered 2021-03-31 – 2021-04-01 (×2): 4 mg via ORAL
  Administered 2021-04-01 (×2): 2 mg via ORAL
  Administered 2021-04-02: 1 mg via ORAL
  Filled 2021-03-31: qty 2
  Filled 2021-03-31: qty 4
  Filled 2021-03-31: qty 1
  Filled 2021-03-31: qty 4
  Filled 2021-03-31: qty 2

## 2021-03-31 MED ORDER — LIDOCAINE 5 % EX PTCH
1.0000 | MEDICATED_PATCH | Freq: Every day | CUTANEOUS | Status: DC
  Administered 2021-03-31 – 2021-04-02 (×3): 1 via TRANSDERMAL
  Filled 2021-03-31 (×7): qty 1

## 2021-03-31 MED ORDER — LORAZEPAM 2 MG/ML IJ SOLN
1.0000 mg | INTRAMUSCULAR | Status: DC | PRN
  Administered 2021-03-31: 2 mg via INTRAVENOUS
  Administered 2021-03-31: 3 mg via INTRAVENOUS
  Administered 2021-03-31: 4 mg via INTRAVENOUS
  Administered 2021-03-31 (×2): 3 mg via INTRAVENOUS
  Administered 2021-03-31 – 2021-04-01 (×2): 4 mg via INTRAVENOUS
  Filled 2021-03-31 (×3): qty 2
  Filled 2021-03-31: qty 1

## 2021-03-31 MED ORDER — MAGNESIUM SULFATE 2 GM/50ML IV SOLN
2.0000 g | Freq: Once | INTRAVENOUS | Status: AC
  Administered 2021-03-31: 2 g via INTRAVENOUS
  Filled 2021-03-31: qty 50

## 2021-03-31 NOTE — Procedures (Signed)
Patient Name: Aino Lanzillo  MRN: [id number]  Epilepsy Attending: Lora Havens  Referring Physician/Provider: Dr Irwin Brakeman Date: 03/31/2021 Duration: 23.56 mins  Patient history: 49 old female with altered mental status.  EEG to evaluate for seizures.  Level of alertness: Awake  AEDs during EEG study: Ativan  Technical aspects: This EEG study was done with scalp electrodes positioned according to the 10-20 International system of electrode placement. Electrical activity was acquired at a sampling rate of '500Hz'$  and reviewed with a high frequency filter of '70Hz'$  and a low frequency filter of '1Hz'$ . EEG data were recorded continuously and digitally stored.   Description: No clear posterior dominant rhythm was seen. There is an excessive amount of 15 to 18 Hz beta activity distributed symmetrically and diffusely. Hyperventilation and photic stimulation were not performed.     ABNORMALITY - Excessive beta, generalized  IMPRESSION: This study is within normal limits. The excessive beta activity seen in the background is most likely due to the effect of benzodiazepine and is a benign EEG pattern. No seizures or epileptiform discharges were seen throughout the recording.  Cochise Dinneen Barbra Sarks

## 2021-03-31 NOTE — Progress Notes (Addendum)
PROGRESS NOTE    Olivia Lester  [id number] DOB: 1976/03/12 DOA: 03/27/2021 PCP: Patient, No Pcp Per (Inactive)   Brief Narrative:   45 y.o. female with medical history significant of alcohol abuse, unspecified back injury PUD with gastric ulcer, history of hepatitis C, history of nephrolithiasis, right foot fracture, history of pancreatitis, panic attacks, history of pneumonia, polysubstance abuse, history of seizures who was brought by EMS after becoming unresponsive due to presumptive overdose with alcohol and maybe on other substances.  EMS responded on scene with CPR and provided 8 mg of Narcan with some response from the patient.  She was brought to the emergency department was intubated for airway protection.  She was initially unable to provide any history and was admitted for toxic metabolic encephalopathy along with acute alcohol withdrawal.  Assessment & Plan:   Principal Problem:   Acute encephalopathy Active Problems:   Alcohol abuse   Hypokalemia   Essential hypertension   Alcohol intoxication ([place])   Hypomagnesemia   Toxic metabolic encephalopathy -   Suspect from alcohol intoxication and now withdrawal, urine drug screen was negative.  Treat supportively.  Urine drug screen was negative, treat supportively.  Pt was temporarily intubated but successfully extubated on 03/29/21.    Acute Alcohol withdrawal-With escalating doses of lorazepam being given with increasing agitation, patient was placed on IV precedex infusion.  TOC consulted to provider SA resources to patient.  Noted to have seizure activity on 9/6.  EEG performed 9/7 with no findings of ongoing seizure activity.   Hypokalemia/hypomagnesemia-this is being repleted.  Follow.    Thrombocytopenia - Monitor closely, DC enoxaparin for platelet count less than 100.    Essential hypertension - poorly controlled, added labetalol IV PRN.   Chronic lumbago-Lidoderm patch and up in chair.   DVT prophylaxis:Lovenox Code  Status: Full Family Communication: None at bedside Disposition Plan:  Status is: Inpatient  Remains inpatient appropriate because:Unsafe d/c plan, IV treatments appropriate due to intensity of illness or inability to take PO, and Inpatient level of care appropriate due to severity of illness  Dispo: The patient is from: Home              Anticipated d/c is to: Home              Patient currently is not medically stable to d/c.   Difficult to place patient No    Consultants:  None  Procedures:  Intubation and extubation  Antimicrobials:  Anti-infectives (From admission, onward)    None       Subjective: Patient seen and evaluated today and is currently calm without anxiety on Precedex drip.  She is complaining of low back pain this morning.  She was beginning to have some episodes of anxiety earlier this morning and required reinitiation of Ativan nightly hours as needed.  Precedex was being weaned yesterday and in the process she began to have some seizure activity.  Objective: Vitals:   03/31/21 1100 03/31/21 1200 03/31/21 1300 03/31/21 1324  BP:    121/88  Pulse:      Resp: '18 14 16 15  '$ Temp:      TempSrc:      SpO2:      Weight:      Height:        Intake/Output Summary (Last 24 hours) at 03/31/2021 1353 Last data filed at 03/31/2021 0742 Gross per 24 hour  Intake 104.86 ml  Output 700 ml  Net -595.14 ml   [employer]  03/27/21 1820 03/29/21 0447  Weight: 52.2 kg 51.5 kg    Examination:  General exam: Appears calm and comfortable  Respiratory system: Clear to auscultation. Respiratory effort normal. Cardiovascular system: S1 & S2 heard, RRR.  Gastrointestinal system: Abdomen is soft Central nervous system: Alert and awake Extremities: No edema Skin: No significant lesions noted Psychiatry: Flat affect.    Data Reviewed: I have personally reviewed following labs and imaging studies  CBC: Recent Labs  Lab 03/27/21 1919 03/28/21 0727  03/29/21 0543 03/30/21 0430 03/31/21 0457  WBC 8.9 11.7* 9.9 8.8 7.1  NEUTROABS 6.1  --  7.5 5.9 5.0  HGB 14.8 11.8* 12.8 13.7 13.1  HCT 43.7 34.2* 38.8 42.2 39.4  MCV 95.2 95.3 95.8 97.5 95.6  PLT 171 145* 112* 106* [id number]*   Basic Metabolic Panel: Recent Labs  Lab 03/27/21 1919 03/27/21 2019 03/28/21 0727 03/29/21 0350 03/30/21 0430 03/31/21 0457  NA 139  --  140 135  --  139  K 3.1*  --  3.4* 3.6  --  3.7  CL 106  --  109 106  --  105  CO2 23  --  26 24  --  28  GLUCOSE 98  --  91 106*  --  113*  BUN 11  --  9 <5*  --  10  CREATININE 0.48  --  0.46 0.34*  --  0.36*  CALCIUM 8.5*  --  8.1* 8.4*  --  9.3  MG  --  1.5* 1.6* 1.9 1.6* 1.6*  PHOS  --   --  2.1*  --   --   --    GFR: Estimated Creatinine Clearance: 73 mL/min (A) (by C-G formula based on SCr of 0.36 mg/dL (L)). Liver Function Tests: Recent Labs  Lab 03/27/21 1919 03/28/21 0727  AST 40 31  ALT 36 28  ALKPHOS 53 41  BILITOT 0.4 0.2*  PROT 7.3 5.5*  ALBUMIN 4.5 3.3*   No results for input(s): LIPASE, AMYLASE in the last 168 hours. Recent Labs  Lab 03/27/21 1921  AMMONIA 24   Coagulation Profile: Recent Labs  Lab 03/27/21 1919  INR 0.9   Cardiac Enzymes: No results for input(s): CKTOTAL, CKMB, CKMBINDEX, TROPONINI in the last 168 hours. BNP (last 3 results) No results for input(s): PROBNP in the last 8760 hours. HbA1C: No results for input(s): HGBA1C in the last 72 hours. CBG: Recent Labs  Lab 03/27/21 1837  GLUCAP 110*   Lipid Profile: No results for input(s): CHOL, HDL, LDLCALC, TRIG, CHOLHDL, LDLDIRECT in the last 72 hours. Thyroid Function Tests: No results for input(s): TSH, T4TOTAL, FREET4, T3FREE, THYROIDAB in the last 72 hours. Anemia Panel: No results for input(s): VITAMINB12, FOLATE, FERRITIN, TIBC, IRON, RETICCTPCT in the last 72 hours. Sepsis Labs: Recent Labs  Lab 03/27/21 1921 03/27/21 2019  LATICACIDVEN 2.0* 2.0*    Recent Results (from the past 240 hour(s))  Resp  Panel by RT-PCR (Flu A&B, Covid) Nasopharyngeal Swab     Status: None   Collection Time: 03/27/21  7:16 PM   Specimen: Nasopharyngeal Swab; Nasopharyngeal(NP) swabs in vial transport medium  Result Value Ref Range Status   SARS Coronavirus 2 by RT PCR NEGATIVE NEGATIVE Final    Comment: (NOTE) SARS-CoV-2 target nucleic acids are NOT DETECTED.  The SARS-CoV-2 RNA is generally detectable in upper respiratory specimens during the acute phase of infection. The lowest concentration of SARS-CoV-2 viral copies this assay can detect is 138 copies/mL. A negative result does not preclude  SARS-Cov-2 infection and should not be used as the sole basis for treatment or other patient management decisions. A negative result may occur with  improper specimen collection/handling, submission of specimen other than nasopharyngeal swab, presence of viral mutation(s) within the areas targeted by this assay, and inadequate number of viral copies(<138 copies/mL). A negative result must be combined with clinical observations, patient history, and epidemiological information. The expected result is Negative.  Fact Sheet for Patients:  EntrepreneurPulse.com.au  Fact Sheet for Healthcare Providers:  IncredibleEmployment.be  This test is no t yet approved or cleared by the [place] FDA and  has been authorized for detection and/or diagnosis of SARS-CoV-2 by FDA under an Emergency Use Authorization (EUA). This EUA will remain  in effect (meaning this test can be used) for the duration of the COVID-19 declaration under Section 564(b)(1) of the Act, 21 U.S.C.section 360bbb-3(b)(1), unless the authorization is terminated  or revoked sooner.       Influenza A by PCR NEGATIVE NEGATIVE Final   Influenza B by PCR NEGATIVE NEGATIVE Final    Comment: (NOTE) The Xpert Xpress SARS-CoV-2/FLU/RSV plus assay is intended as an aid in the diagnosis of influenza from Nasopharyngeal  swab specimens and should not be used as a sole basis for treatment. Nasal washings and aspirates are unacceptable for Xpert Xpress SARS-CoV-2/FLU/RSV testing.  Fact Sheet for Patients: EntrepreneurPulse.com.au  Fact Sheet for Healthcare Providers: IncredibleEmployment.be  This test is not yet approved or cleared by the [place] FDA and has been authorized for detection and/or diagnosis of SARS-CoV-2 by FDA under an Emergency Use Authorization (EUA). This EUA will remain in effect (meaning this test can be used) for the duration of the COVID-19 declaration under Section 564(b)(1) of the Act, 21 U.S.C. section 360bbb-3(b)(1), unless the authorization is terminated or revoked.  Performed at [facility], [address] [place], [place] [address]   MRSA Next Gen by PCR, Nasal     Status: None   Collection Time: 03/28/21  1:26 PM   Specimen: Nasal Mucosa; Nasal Swab  Result Value Ref Range Status   MRSA by PCR Next Gen NOT DETECTED NOT DETECTED Final    Comment: (NOTE) The GeneXpert MRSA Assay (FDA approved for NASAL specimens only), is one component of a comprehensive MRSA colonization surveillance program. It is not intended to diagnose MRSA infection nor to guide or monitor treatment for MRSA infections. Test performance is not FDA approved in patients less than 44 years old. Performed at [facility], [address] [place],  [address]          Radiology Studies: EEG adult  Result Date: 29-Apr-2021 Lora Havens, MD     April 29, 2021 11:12 AM Patient Name: Volanda Hallak MRN: [id number] Epilepsy Attending: Lora Havens Referring Physician/Provider: Dr Irwin Brakeman Date: 04-29-2021 Duration: 23.56 mins Patient history: 26 old female with altered mental status.  EEG to evaluate for seizures. Level of alertness: Awake AEDs during EEG study: Ativan Technical aspects: This EEG study was done with scalp electrodes positioned according to  the 10-20 International system of electrode placement. Electrical activity was acquired at a sampling rate of '500Hz'$  and reviewed with a high frequency filter of '70Hz'$  and a low frequency filter of '1Hz'$ . EEG data were recorded continuously and digitally stored. Description: No clear posterior dominant rhythm was seen. There is an excessive amount of 15 to 18 Hz beta activity distributed symmetrically and diffusely. Hyperventilation and photic stimulation were not performed.   ABNORMALITY - Excessive beta,  generalized IMPRESSION: This study is within normal limits. The excessive beta activity seen in the background is most likely due to the effect of benzodiazepine and is a benign EEG pattern. No seizures or epileptiform discharges were seen throughout the recording. Priyanka Barbra Sarks        Scheduled Meds:  amitriptyline  50 mg Oral QHS   chlordiazePOXIDE  25 mg Oral TID   Chlorhexidine Gluconate Cloth  6 each Topical Daily   enoxaparin (LOVENOX) injection  40 mg Subcutaneous [id number]   folic acid  1 mg Oral Daily   lidocaine  1 patch Transdermal Daily   LORazepam  0-4 mg Intravenous Q8H   multivitamin with minerals  1 tablet Oral Daily   thiamine  100 mg Oral Daily   Or   thiamine  100 mg Intravenous Daily   Continuous Infusions:  dexmedetomidine (PRECEDEX) IV infusion 0.4 mcg/kg/hr (03/31/21 0743)   dextrose 5 % and 0.9 % NaCl with KCl 20 mEq/L 50 mL/hr at 03/30/21 1740   magnesium sulfate bolus IVPB       LOS: 4 days    Time spent: 35 minutes    Saraann Enneking Darleen Crocker, DO [facility]  If 7PM-7AM, please contact night-coverage [email or web address] 03/31/2021, 1:53 PM

## 2021-03-31 NOTE — Progress Notes (Signed)
EEG Completed; Results Pending  

## 2021-03-31 NOTE — Progress Notes (Signed)
Pt was noted to be having seizure activity. '2mg'$  Ativan was given. Pt slowly becoming more alert to verbal but shallow breathing.

## 2021-03-31 NOTE — Progress Notes (Signed)
Pt is very agitated and keeps pulling leads off and trying to get out of bed. She keeps stating that we can not keep her here like this.  Precedex has been titrated up appropriately as well as more Ativan given. Have to keep reorienting patient as to why she is here and that we are only trying to help her. Bed alarm is on and door is open to her room for better observation.

## 2021-04-01 ENCOUNTER — Inpatient Hospital Stay (HOSPITAL_COMMUNITY): Payer: Self-pay

## 2021-04-01 LAB — CBC WITH DIFFERENTIAL/PLATELET
Abs Immature Granulocytes: 0.03 10*3/uL (ref 0.00–0.07)
Basophils Absolute: 0 10*3/uL (ref 0.0–0.1)
Basophils Relative: 0 %
Eosinophils Absolute: 0.1 10*3/uL (ref 0.0–0.5)
Eosinophils Relative: 2 %
HCT: 38.4 % (ref 36.0–46.0)
Hemoglobin: 12.8 g/dL (ref 12.0–15.0)
Immature Granulocytes: 1 %
Lymphocytes Relative: 23 %
Lymphs Abs: 1.5 10*3/uL (ref 0.7–4.0)
MCH: 31.4 pg (ref 26.0–34.0)
MCHC: 33.3 g/dL (ref 30.0–36.0)
MCV: 94.3 fL (ref 80.0–100.0)
Monocytes Absolute: 0.5 10*3/uL (ref 0.1–1.0)
Monocytes Relative: 8 %
Neutro Abs: 4.4 10*3/uL (ref 1.7–7.7)
Neutrophils Relative %: 66 %
Platelets: 123 10*3/uL — ABNORMAL LOW (ref 150–400)
RBC: 4.07 MIL/uL (ref 3.87–5.11)
RDW: 13.6 % (ref 11.5–15.5)
WBC: 6.7 10*3/uL (ref 4.0–10.5)
nRBC: 0 % (ref 0.0–0.2)

## 2021-04-01 LAB — BASIC METABOLIC PANEL
Anion gap: 9 (ref 5–15)
BUN: 7 mg/dL (ref 6–20)
CO2: 27 mmol/L (ref 22–32)
Calcium: 9.7 mg/dL (ref 8.9–10.3)
Chloride: 104 mmol/L (ref 98–111)
Creatinine, Ser: 0.46 mg/dL (ref 0.44–1.00)
GFR, Estimated: >60 mL/min (ref >60)
Glucose, Bld: 122 mg/dL — ABNORMAL HIGH (ref 70–99)
Potassium: 3.8 mmol/L (ref 3.5–5.1)
Sodium: 140 mmol/L (ref 135–145)

## 2021-04-01 LAB — MAGNESIUM: Magnesium: 1.5 mg/dL — ABNORMAL LOW (ref 1.7–2.4)

## 2021-04-01 MED ORDER — CHLORDIAZEPOXIDE HCL 25 MG PO CAPS
25.0000 mg | ORAL_CAPSULE | Freq: Four times a day (QID) | ORAL | Status: DC
  Administered 2021-04-01 – 2021-04-02 (×5): 25 mg via ORAL
  Filled 2021-04-01 (×5): qty 1

## 2021-04-01 MED ORDER — LORAZEPAM 2 MG/ML IJ SOLN
0.0000 mg | Freq: Three times a day (TID) | INTRAMUSCULAR | Status: DC
  Administered 2021-04-01: 4 mg via INTRAVENOUS
  Administered 2021-04-01: 2 mg via INTRAVENOUS
  Filled 2021-04-01: qty 1
  Filled 2021-04-01: qty 2

## 2021-04-01 MED ORDER — MAGNESIUM SULFATE 2 GM/50ML IV SOLN
2.0000 g | Freq: Once | INTRAVENOUS | Status: AC
  Administered 2021-04-01: 2 g via INTRAVENOUS
  Filled 2021-04-01: qty 50

## 2021-04-01 NOTE — Progress Notes (Signed)
PROGRESS NOTE    Olivia Lester  [id number] DOB: August 28, 1975 DOA: 03/27/2021 PCP: Patient, No Pcp Per (Inactive)   Brief Narrative:   45 y.o. female with medical history significant of alcohol abuse, unspecified back injury PUD with gastric ulcer, history of hepatitis C, history of nephrolithiasis, right foot fracture, history of pancreatitis, panic attacks, history of pneumonia, polysubstance abuse, history of seizures who was brought by EMS after becoming unresponsive due to presumptive overdose with alcohol and maybe on other substances.  EMS responded on scene with CPR and provided 8 mg of Narcan with some response from the patient.  She was brought to the emergency department was intubated for airway protection.  She was initially unable to provide any history and was admitted for toxic metabolic encephalopathy along with acute alcohol withdrawal.  Assessment & Plan:   Principal Problem:   Acute encephalopathy Active Problems:   Alcohol abuse   Hypokalemia   Essential hypertension   Alcohol intoxication ([place])   Hypomagnesemia   Toxic metabolic encephalopathy -   Suspect from alcohol intoxication and now withdrawal, urine drug screen was negative.  Treat supportively.  Urine drug screen was negative, treat supportively.  Pt was temporarily intubated but successfully extubated on 03/29/21.    Acute Alcohol withdrawal-With escalating doses of lorazepam being given with increasing agitation, patient was placed on IV precedex infusion.  TOC consulted to provider SA resources to patient.  Noted to have seizure activity on 9/6.  EEG performed 9/7 with no findings of ongoing seizure activity.  She does continue to have some seizure activity clinically noted overnight on 9/7.  May consider addition of Keppra if this continues to persist.   Hypomagnesemia-this is being repleted.  Follow.    Thrombocytopenia - Monitor closely, DC enoxaparin for platelet count less than 100.  Currently improving.    Essential hypertension - poorly controlled, added labetalol IV PRN.    Chronic lumbago-Lidoderm patch and up in chair.     DVT prophylaxis:Lovenox Code Status: Full Family Communication: Tried calling mother with no response 9/8 Disposition Plan:  Status is: Inpatient   Remains inpatient appropriate because:Unsafe d/c plan, IV treatments appropriate due to intensity of illness or inability to take PO, and Inpatient level of care appropriate due to severity of illness   Dispo: The patient is from: Home              Anticipated d/c is to: Home              Patient currently is not medically stable to d/c.              Difficult to place patient No       Consultants:  None   Procedures:  Intubation and extubation  Antimicrobials:  None   Subjective: Patient seen and evaluated today with somnolence this AM.  She was noted to have a brief period of seizure activity on the evening of 9/7.  No other concerns noted overnight.  Objective: Vitals:   04/01/21 0540 04/01/21 0600 04/01/21 0700 04/01/21 0800  BP:  (!) 156/102 (!) 147/99 (!) 146/100  Pulse:  67 73 73  Resp:  20 19 (!) 23  Temp: (!) 97.5 F (36.4 C)  97.6 F (36.4 C)   TempSrc: Oral  Oral   SpO2:  100% 98% 98%  Weight:      Height:        Intake/Output Summary (Last 24 hours) at 04/01/2021 0940 Last data filed at 04/01/2021 0815 Gross  per 24 hour  Intake 241.2 ml  Output 3150 ml  Net -2908.8 ml   Filed Weights   03/27/21 1820 03/29/21 0447  Weight: 52.2 kg 51.5 kg    Examination:  General exam: Appears somnolent Respiratory system: Clear to auscultation. Respiratory effort normal. Cardiovascular system: S1 & S2 heard, RRR.  Gastrointestinal system: Abdomen is soft Central nervous system: Somnolent Extremities: No edema Skin: No significant lesions noted Psychiatry: Flat affect.    Data Reviewed: I have personally reviewed following labs and imaging studies  CBC: Recent Labs  Lab  03/27/21 1919 03/28/21 0727 03/29/21 0543 03/30/21 0430 03/31/21 0457 04/01/21 0516  WBC 8.9 11.7* 9.9 8.8 7.1 6.7  NEUTROABS 6.1  --  7.5 5.9 5.0 4.4  HGB 14.8 11.8* 12.8 13.7 13.1 12.8  HCT 43.7 34.2* 38.8 42.2 39.4 38.4  MCV 95.2 95.3 95.8 97.5 95.6 94.3  PLT 171 145* 112* 106* 107* [id number]*   Basic Metabolic Panel: Recent Labs  Lab 03/27/21 1919 03/27/21 2019 03/28/21 0727 03/29/21 0350 03/30/21 0430 03/31/21 0457 04/01/21 0516  NA 139  --  140 135  --  139 140  K 3.1*  --  3.4* 3.6  --  3.7 3.8  CL 106  --  109 106  --  105 104  CO2 23  --  26 24  --  28 27  GLUCOSE 98  --  91 106*  --  113* 122*  BUN 11  --  9 <5*  --  10 7  CREATININE 0.48  --  0.46 0.34*  --  0.36* 0.46  CALCIUM 8.5*  --  8.1* 8.4*  --  9.3 9.7  MG  --    < > 1.6* 1.9 1.6* 1.6* 1.5*  PHOS  --   --  2.1*  --   --   --   --    < > = values in this interval not displayed.   GFR: Estimated Creatinine Clearance: 73 mL/min (by C-G formula based on SCr of 0.46 mg/dL). Liver Function Tests: Recent Labs  Lab 03/27/21 1919 03/28/21 0727  AST 40 31  ALT 36 28  ALKPHOS 53 41  BILITOT 0.4 0.2*  PROT 7.3 5.5*  ALBUMIN 4.5 3.3*   No results for input(s): LIPASE, AMYLASE in the last 168 hours. Recent Labs  Lab 03/27/21 1921  AMMONIA 24   Coagulation Profile: Recent Labs  Lab 03/27/21 1919  INR 0.9   Cardiac Enzymes: No results for input(s): CKTOTAL, CKMB, CKMBINDEX, TROPONINI in the last 168 hours. BNP (last 3 results) No results for input(s): PROBNP in the last 8760 hours. HbA1C: No results for input(s): HGBA1C in the last 72 hours. CBG: Recent Labs  Lab 03/27/21 1837  GLUCAP 110*   Lipid Profile: No results for input(s): CHOL, HDL, LDLCALC, TRIG, CHOLHDL, LDLDIRECT in the last 72 hours. Thyroid Function Tests: No results for input(s): TSH, T4TOTAL, FREET4, T3FREE, THYROIDAB in the last 72 hours. Anemia Panel: No results for input(s): VITAMINB12, FOLATE, FERRITIN, TIBC, IRON,  RETICCTPCT in the last 72 hours. Sepsis Labs: Recent Labs  Lab 03/27/21 1921 03/27/21 2019  LATICACIDVEN 2.0* 2.0*    Recent Results (from the past 240 hour(s))  Resp Panel by RT-PCR (Flu A&B, Covid) Nasopharyngeal Swab     Status: None   Collection Time: 03/27/21  7:16 PM   Specimen: Nasopharyngeal Swab; Nasopharyngeal(NP) swabs in vial transport medium  Result Value Ref Range Status   SARS Coronavirus 2 by RT PCR NEGATIVE NEGATIVE  Final    Comment: (NOTE) SARS-CoV-2 target nucleic acids are NOT DETECTED.  The SARS-CoV-2 RNA is generally detectable in upper respiratory specimens during the acute phase of infection. The lowest concentration of SARS-CoV-2 viral copies this assay can detect is 138 copies/mL. A negative result does not preclude SARS-Cov-2 infection and should not be used as the sole basis for treatment or other patient management decisions. A negative result may occur with  improper specimen collection/handling, submission of specimen other than nasopharyngeal swab, presence of viral mutation(s) within the areas targeted by this assay, and inadequate number of viral copies(<138 copies/mL). A negative result must be combined with clinical observations, patient history, and epidemiological information. The expected result is Negative.  Fact Sheet for Patients:  EntrepreneurPulse.com.au  Fact Sheet for Healthcare Providers:  IncredibleEmployment.be  This test is no t yet approved or cleared by the [place] FDA and  has been authorized for detection and/or diagnosis of SARS-CoV-2 by FDA under an Emergency Use Authorization (EUA). This EUA will remain  in effect (meaning this test can be used) for the duration of the COVID-19 declaration under Section 564(b)(1) of the Act, 21 U.S.C.section 360bbb-3(b)(1), unless the authorization is terminated  or revoked sooner.       Influenza A by PCR NEGATIVE NEGATIVE Final    Influenza B by PCR NEGATIVE NEGATIVE Final    Comment: (NOTE) The Xpert Xpress SARS-CoV-2/FLU/RSV plus assay is intended as an aid in the diagnosis of influenza from Nasopharyngeal swab specimens and should not be used as a sole basis for treatment. Nasal washings and aspirates are unacceptable for Xpert Xpress SARS-CoV-2/FLU/RSV testing.  Fact Sheet for Patients: EntrepreneurPulse.com.au  Fact Sheet for Healthcare Providers: IncredibleEmployment.be  This test is not yet approved or cleared by the [place] FDA and has been authorized for detection and/or diagnosis of SARS-CoV-2 by FDA under an Emergency Use Authorization (EUA). This EUA will remain in effect (meaning this test can be used) for the duration of the COVID-19 declaration under Section 564(b)(1) of the Act, 21 U.S.C. section 360bbb-3(b)(1), unless the authorization is terminated or revoked.  Performed at [facility], [address], [place], [place] [address]   MRSA Next Gen by PCR, Nasal     Status: None   Collection Time: 03/28/21  1:26 PM   Specimen: Nasal Mucosa; Nasal Swab  Result Value Ref Range Status   MRSA by PCR Next Gen NOT DETECTED NOT DETECTED Final    Comment: (NOTE) The GeneXpert MRSA Assay (FDA approved for NASAL specimens only), is one component of a comprehensive MRSA colonization surveillance program. It is not intended to diagnose MRSA infection nor to guide or monitor treatment for MRSA infections. Test performance is not FDA approved in patients less than 79 years old. Performed at [facility], [address], [place], [place] [address]          Radiology Studies: EEG adult  Result Date: 04-02-2021 Lora Havens, MD     04/02/2021 11:12 AM Patient Name: Kashvi Osuch MRN: [id number] Epilepsy Attending: Lora Havens Referring Physician/Provider: Dr Irwin Brakeman Date: 04/02/21 Duration: 23.56 mins Patient history: 25 old female with altered mental  status.  EEG to evaluate for seizures. Level of alertness: Awake AEDs during EEG study: Ativan Technical aspects: This EEG study was done with scalp electrodes positioned according to the 10-20 International system of electrode placement. Electrical activity was acquired at a sampling rate of '500Hz'$  and reviewed with a high frequency filter of '70Hz'$  and a  low frequency filter of '1Hz'$ . EEG data were recorded continuously and digitally stored. Description: No clear posterior dominant rhythm was seen. There is an excessive amount of 15 to 18 Hz beta activity distributed symmetrically and diffusely. Hyperventilation and photic stimulation were not performed.   ABNORMALITY - Excessive beta, generalized IMPRESSION: This study is within normal limits. The excessive beta activity seen in the background is most likely due to the effect of benzodiazepine and is a benign EEG pattern. No seizures or epileptiform discharges were seen throughout the recording. Priyanka Barbra Sarks        Scheduled Meds:  amitriptyline  50 mg Oral QHS   chlordiazePOXIDE  25 mg Oral QID   Chlorhexidine Gluconate Cloth  6 each Topical Daily   enoxaparin (LOVENOX) injection  40 mg Subcutaneous [id number]   folic acid  1 mg Oral Daily   lidocaine  1 patch Transdermal Daily   LORazepam  0-4 mg Intravenous Q8H   multivitamin with minerals  1 tablet Oral Daily   thiamine  100 mg Oral Daily   Or   thiamine  100 mg Intravenous Daily   Continuous Infusions:  dexmedetomidine (PRECEDEX) IV infusion 1.2 mcg/kg/hr (04/01/21 0815)   dextrose 5 % and 0.9 % NaCl with KCl 20 mEq/L 50 mL/hr at 03/31/21 2326     LOS: 5 days    Time spent: 35 minutes    Shoaib Siefker D Manuella Ghazi, DO [facility]  If 7PM-7AM, please contact night-coverage [email or web address] 04/01/2021, 9:40 AM

## 2021-04-01 NOTE — Progress Notes (Signed)
PT Cancellation Note  Patient Details Name: Olivia Lester MRN: [id number] DOB: 07/21/1976   Cancelled Treatment:    Reason Eval/Treat Not Completed: Other (comment) (chaplain at bedside). RN states pt c/o knee pain, inability to straighten knee due to old cheerleading accident, also with chronic back pain. Chaplain currently at pt's bedside. Will check back for PT eval as able.   Talbot Grumbling PT, DPT 04/01/21, 12:33 PM

## 2021-04-02 LAB — BASIC METABOLIC PANEL
Anion gap: 9 (ref 5–15)
BUN: 12 mg/dL (ref 6–20)
CO2: 27 mmol/L (ref 22–32)
Calcium: 9.5 mg/dL (ref 8.9–10.3)
Chloride: 101 mmol/L (ref 98–111)
Creatinine, Ser: 0.49 mg/dL (ref 0.44–1.00)
GFR, Estimated: >60 mL/min (ref >60)
Glucose, Bld: 97 mg/dL (ref 70–99)
Potassium: 4 mmol/L (ref 3.5–5.1)
Sodium: 137 mmol/L (ref 135–145)

## 2021-04-02 LAB — MAGNESIUM: Magnesium: 1.7 mg/dL (ref 1.7–2.4)

## 2021-04-02 MED ORDER — CHLORDIAZEPOXIDE HCL 25 MG PO CAPS: ORAL_CAPSULE | ORAL | 0 refills | Status: DC

## 2021-04-02 MED ORDER — THIAMINE HCL 100 MG PO TABS: 100.0000 mg | ORAL_TABLET | Freq: Every day | ORAL | 0 refills | Status: AC

## 2021-04-02 NOTE — TOC Progression Note (Addendum)
Transition of Care [facility]) - Progression Note    Patient Details  Name: Kailanni Backhus MRN: [id number] Date of Birth: 1975/08/28  [facility]) CM/SW Contact  Salome Arnt, [place] Phone Number: 04/02/2021, 9:25 AM  Clinical Narrative:   PT recommending outpatient PT. LCSW discussed with pt as pt has no insurance. Per [facility], initial evaluation is more, but treatments are $143 out of pocket. Pt aware and agreeable. Referral made. Pt also needs rolling walker. Referred to [employer] for charity. Will deliver to room.   Update- Pt has substance abuse resources in room. Discussed outpatient rehab again and pt thought out of pocket cost was $43. She now says she cannot afford. Pt will tell outpatient rehab to cancel when they call to schedule.        Expected Discharge Plan and Services                                                 Social Determinants of Health (SDOH) Interventions    Readmission Risk Interventions Readmission Risk Prevention Plan 07/07/2020  Transportation Screening Complete  Medication Review Press photographer) Complete  HRI or [place] Complete  SW Recovery Care/Counseling Consult Complete  Palliative Care Screening Not [place] Not Applicable  Some recent data might be hidden

## 2021-04-02 NOTE — Discharge Summary (Signed)
Physician Discharge Summary  Smt Stidam [id number] DOB: 03/11/1976 DOA: 03/27/2021  PCP: Patient, No Pcp Per (Inactive)  Admit date: 03/27/2021  Discharge date: 04/02/2021  Admitted From:Home  Disposition:  Home  Recommendations for Outpatient Follow-up:  Follow up with PCP in 1-2 weeks Follow-up with alcohol cessation counseling services and remain on Librium taper as prescribed Continue other home medications as noted below  Home Health: Outpatient PT  Equipment/Devices: Home Walker  Discharge Condition:Stable  CODE STATUS: Full  Diet recommendation: Heart Healthy  Brief/Interim Summary:  45 y.o. female with medical history significant of alcohol abuse, unspecified back injury PUD with gastric ulcer, history of hepatitis C, history of nephrolithiasis, right foot fracture, history of pancreatitis, panic attacks, history of pneumonia, polysubstance abuse, history of seizures who was brought by EMS after becoming unresponsive due to presumptive overdose with alcohol and maybe on other substances.  EMS responded on scene with CPR and provided 8 mg of Narcan with some response from the patient.  She was brought to the emergency department was intubated for airway protection.  She was initially unable to provide any history and was admitted for toxic metabolic encephalopathy along with acute alcohol withdrawal.  She was eventually extubated and had to remain on Precedex drip as well as IV Ativan on a frequent basis.  She did have some seizure activity and EEG was performed, but with no seizure activity noted.  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  She was weaned off of her Precedex and appears to be doing quite well and is back to her usual baseline.  She is in stable condition for discharge and PT has assessed her with recommendation for home walker.  No other acute events noted throughout the course of the stay.  She has been counseled on alcohol  cessation and has been given a Librium taper to assist her with this process.  She has been given alcohol cessation counseling resources on discharge.  Discharge Diagnoses:  Principal Problem:   Acute encephalopathy Active Problems:   Alcohol abuse   Hypokalemia   Essential hypertension   Alcohol intoxication ([place])   Hypomagnesemia  Principal discharge diagnosis: Acute metabolic encephalopathy secondary to alcohol intoxication followed by withdrawal.  Discharge Instructions  Discharge Instructions     Ambulatory referral to Physical Therapy   Complete by: As directed    Diet - low sodium heart healthy   Complete by: As directed    Increase activity slowly   Complete by: As directed       Allergies as of 04/02/2021       Reactions   Toradol [ketorolac Tromethamine] Other (See Comments)   Patient advised not to take due to ulcers   Acetaminophen    Asa [aspirin] Rash        Medication List     STOP taking these medications    Eliquis 2.5 MG Tabs tablet Generic drug: apixaban       TAKE these medications    amitriptyline 50 MG tablet Commonly known as: ELAVIL Take 50 mg by mouth at bedtime.   amLODipine 5 MG tablet Commonly known as: NORVASC Take 1 tablet (5 mg total) by mouth daily.   chlordiazePOXIDE 25 MG capsule Commonly known as: LIBRIUM '50mg'$  PO TID x 1D, then 25-'50mg'$  PO BID X 1D, then 25-'50mg'$  PO QD X 1D   folic acid 1 MG tablet Commonly known as: FOLVITE Take 1 tablet (1 mg total) by mouth daily.   lidocaine  5 % Commonly known as: LIDODERM Place 1 patch onto the skin daily. Remove & Discard patch within 12 hours or as directed by MD   methocarbamol 500 MG tablet Commonly known as: ROBAXIN Take 1 tablet (500 mg total) by mouth every 8 (eight) hours as needed for muscle spasms.   multivitamin with minerals Tabs tablet Take 1 tablet by mouth every other day.   Pancrelipase (Lip-Prot-Amyl) 24000-76000 units Cpep Take 1 capsule (24,000 Units  total) by mouth 3 (three) times daily before meals.   pantoprazole 40 MG tablet Commonly known as: Protonix Take 1 tablet (40 mg total) by mouth daily.   potassium chloride SA 20 MEQ tablet Commonly known as: KLOR-CON Take 20 mEq by mouth 2 (two) times daily. 3 day supply last filled 06-25-20   promethazine 25 MG tablet Commonly known as: PHENERGAN Take 1 tablet (25 mg total) by mouth every 6 (six) hours as needed for nausea or vomiting.   thiamine 100 MG tablet Take 1 tablet (100 mg total) by mouth daily. Start taking on: April 03, 2021               Durable Medical Equipment  (From admission, onward)           Start     Ordered   04/02/21 0936  For home use only DME Walker rolling  Once       Question Answer Comment  Walker: With 5 Inch Wheels   Patient needs a walker to treat with the following condition Weakness      04/02/21 0936            Follow-up Information     pcp Follow up in 1 week(s).                 Allergies  Allergen Reactions   Toradol [Ketorolac Tromethamine] Other (See Comments)    Patient advised not to take due to ulcers   Acetaminophen    Asa [Aspirin] Rash    Consultations: None   Procedures/Studies: CT HEAD WO CONTRAST (5MM)  Result Date: 03/27/2021 CLINICAL DATA:  Mental status change, unknown cause EXAM: CT HEAD WITHOUT CONTRAST TECHNIQUE: Contiguous axial images were obtained from the base of the skull through the vertex without intravenous contrast. COMPARISON:  None. FINDINGS: Brain: No acute intracranial abnormality. Specifically, no hemorrhage, hydrocephalus, mass lesion, acute infarction, or significant intracranial injury. Vascular: No hyperdense vessel or unexpected calcification. Skull: No acute calvarial abnormality. Sinuses/Orbits: No acute findings Other: None IMPRESSION: Normal study. Electronically Signed   By: Rolm Baptise M.D.   On: 03/27/2021 20:08   DG Chest Portable 1 View  Result Date:  03/27/2021 CLINICAL DATA:  Post intubation EXAM: PORTABLE CHEST 1 VIEW COMPARISON:  10/01/2020 FINDINGS: endotracheal tube is 4 cm above the carina. NG tube tip is in the distal esophagus. Heart is normal size. Lungs clear. No effusions or acute bony abnormality. IMPRESSION: Endotracheal tube in expected position. NG tube tip in the distal esophagus. Recommend advancing several cm. No acute cardiopulmonary disease. Electronically Signed   By: Rolm Baptise M.D.   On: 03/27/2021 19:12   DG Chest Port 1V same Day  Result Date: 03/27/2021 CLINICAL DATA:  OG tube advancement. EXAM: PORTABLE CHEST 1 VIEW COMPARISON:  Radiograph earlier today. FINDINGS: Enteric tube has been advanced, the tip and side-port now below the diaphragm in the stomach. Endotracheal tube tip is 4.6 cm from the carina. Heart is normal in size with normal mediastinal contours. Minimal retrocardiac atelectasis.  No pneumothorax or pulmonary edema. No pleural fluid. Stable osseous structures. IMPRESSION: 1. Enteric tube has been advanced, tip and side-port now below the diaphragm in the stomach. Endotracheal tube tip 4.6 cm from the carina. 2. Minimal retrocardiac atelectasis. Electronically Signed   By: Keith Rake M.D.   On: 03/27/2021 21:50   DG Knee Complete 4 Views Right  Result Date: 04/01/2021 CLINICAL DATA:  Right knee pain. EXAM: RIGHT KNEE - COMPLETE 4+ VIEW COMPARISON:  Right knee x-ray 10/01/2020. FINDINGS: No evidence of fracture, dislocation, or joint effusion. No evidence of arthropathy or other focal bone abnormality. Soft tissues are unremarkable. IMPRESSION: Negative. Electronically Signed   By: Ronney Asters M.D.   On: 04/01/2021 19:20   EEG adult  Result Date: 03/31/2021 Lora Havens, MD     03/31/2021 11:12 AM Patient Name: Loriah Delira MRN: [id number] Epilepsy Attending: Lora Havens Referring Physician/Provider: Dr Irwin Brakeman Date: 03/31/2021 Duration: 23.56 mins Patient history: 14 old female with altered  mental status.  EEG to evaluate for seizures. Level of alertness: Awake AEDs during EEG study: Ativan Technical aspects: This EEG study was done with scalp electrodes positioned according to the 10-20 International system of electrode placement. Electrical activity was acquired at a sampling rate of '500Hz'$  and reviewed with a high frequency filter of '70Hz'$  and a low frequency filter of '1Hz'$ . EEG data were recorded continuously and digitally stored. Description: No clear posterior dominant rhythm was seen. There is an excessive amount of 15 to 18 Hz beta activity distributed symmetrically and diffusely. Hyperventilation and photic stimulation were not performed.   ABNORMALITY - Excessive beta, generalized IMPRESSION: This study is within normal limits. The excessive beta activity seen in the background is most likely due to the effect of benzodiazepine and is a benign EEG pattern. No seizures or epileptiform discharges were seen throughout the recording. Lora Havens     Discharge Exam: Vitals:   04/02/21 0600 04/02/21 0700  BP: 121/75   Pulse: (!) 36   Resp: (!) 29 20  Temp:  99 F (37.2 C)  SpO2: 98%    Vitals:   04/02/21 0423 04/02/21 0500 04/02/21 0600 04/02/21 0700  BP:  102/65 121/75   Pulse:   (!) 36   Resp:  (!) 22 (!) 29 20  Temp: 98.1 F (36.7 C)   99 F (37.2 C)  TempSrc: Oral   Oral  SpO2:   98%   Weight: 51.7 kg     Height:        General: Pt is alert, awake, not in acute distress Cardiovascular: RRR, S1/S2 +, no rubs, no gallops Respiratory: CTA bilaterally, no wheezing, no rhonchi Abdominal: Soft, NT, ND, bowel sounds + Extremities: no edema, no cyanosis    The results of significant diagnostics from this hospitalization (including imaging, microbiology, ancillary and laboratory) are listed below for reference.     Microbiology: Recent Results (from the past 240 hour(s))  Resp Panel by RT-PCR (Flu A&B, Covid) Nasopharyngeal Swab     Status: None   Collection  Time: 03/27/21  7:16 PM   Specimen: Nasopharyngeal Swab; Nasopharyngeal(NP) swabs in vial transport medium  Result Value Ref Range Status   SARS Coronavirus 2 by RT PCR NEGATIVE NEGATIVE Final    Comment: (NOTE) SARS-CoV-2 target nucleic acids are NOT DETECTED.  The SARS-CoV-2 RNA is generally detectable in upper respiratory specimens during the acute phase of infection. The lowest concentration of SARS-CoV-2 viral copies this assay can detect is 138  copies/mL. A negative result does not preclude SARS-Cov-2 infection and should not be used as the sole basis for treatment or other patient management decisions. A negative result may occur with  improper specimen collection/handling, submission of specimen other than nasopharyngeal swab, presence of viral mutation(s) within the areas targeted by this assay, and inadequate number of viral copies(<138 copies/mL). A negative result must be combined with clinical observations, patient history, and epidemiological information. The expected result is Negative.  Fact Sheet for Patients:  EntrepreneurPulse.com.au  Fact Sheet for Healthcare Providers:  IncredibleEmployment.be  This test is no t yet approved or cleared by the [place] FDA and  has been authorized for detection and/or diagnosis of SARS-CoV-2 by FDA under an Emergency Use Authorization (EUA). This EUA will remain  in effect (meaning this test can be used) for the duration of the COVID-19 declaration under Section 564(b)(1) of the Act, 21 U.S.C.section 360bbb-3(b)(1), unless the authorization is terminated  or revoked sooner.       Influenza A by PCR NEGATIVE NEGATIVE Final   Influenza B by PCR NEGATIVE NEGATIVE Final    Comment: (NOTE) The Xpert Xpress SARS-CoV-2/FLU/RSV plus assay is intended as an aid in the diagnosis of influenza from Nasopharyngeal swab specimens and should not be used as a sole basis for treatment. Nasal washings  and aspirates are unacceptable for Xpert Xpress SARS-CoV-2/FLU/RSV testing.  Fact Sheet for Patients: EntrepreneurPulse.com.au  Fact Sheet for Healthcare Providers: IncredibleEmployment.be  This test is not yet approved or cleared by the [place] FDA and has been authorized for detection and/or diagnosis of SARS-CoV-2 by FDA under an Emergency Use Authorization (EUA). This EUA will remain in effect (meaning this test can be used) for the duration of the COVID-19 declaration under Section 564(b)(1) of the Act, 21 U.S.C. section 360bbb-3(b)(1), unless the authorization is terminated or revoked.  Performed at [facility], [address], [place], [place] [address]   MRSA Next Gen by PCR, Nasal     Status: None   Collection Time: 03/28/21  1:26 PM   Specimen: Nasal Mucosa; Nasal Swab  Result Value Ref Range Status   MRSA by PCR Next Gen NOT DETECTED NOT DETECTED Final    Comment: (NOTE) The GeneXpert MRSA Assay (FDA approved for NASAL specimens only), is one component of a comprehensive MRSA colonization surveillance program. It is not intended to diagnose MRSA infection nor to guide or monitor treatment for MRSA infections. Test performance is not FDA approved in patients less than 7 years old. Performed at [facility], [address], [place], [place] [address]      Labs: BNP (last 3 results) No results for input(s): BNP in the last 8760 hours. Basic Metabolic Panel: Recent Labs  Lab 03/28/21 0727 03/29/21 0350 03/30/21 0430 03/31/21 0457 04/01/21 0516 04/02/21 0451  NA 140 135  --  139 140 137  K 3.4* 3.6  --  3.7 3.8 4.0  CL 109 106  --  105 104 101  CO2 26 24  --  '28 27 27  '$ GLUCOSE 91 106*  --  113* 122* 97  BUN 9 <5*  --  '10 7 12  '$ CREATININE 0.46 0.34*  --  0.36* 0.46 0.49  CALCIUM 8.1* 8.4*  --  9.3 9.7 9.5  MG 1.6* 1.9 1.6* 1.6* 1.5* 1.7  PHOS 2.1*  --   --   --   --   --    Liver Function Tests: Recent Labs  Lab  03/27/21 1919 03/28/21  0727  AST 40 31  ALT 36 28  ALKPHOS 53 41  BILITOT 0.4 0.2*  PROT 7.3 5.5*  ALBUMIN 4.5 3.3*   No results for input(s): LIPASE, AMYLASE in the last 168 hours. Recent Labs  Lab 03/27/21 1921  AMMONIA 24   CBC: Recent Labs  Lab 03/27/21 1919 03/28/21 0727 03/29/21 0543 03/30/21 0430 03/31/21 0457 04/01/21 0516  WBC 8.9 11.7* 9.9 8.8 7.1 6.7  NEUTROABS 6.1  --  7.5 5.9 5.0 4.4  HGB 14.8 11.8* 12.8 13.7 13.1 12.8  HCT 43.7 34.2* 38.8 42.2 39.4 38.4  MCV 95.2 95.3 95.8 97.5 95.6 94.3  PLT 171 145* 112* 106* 107* 123*   Cardiac Enzymes: No results for input(s): CKTOTAL, CKMB, CKMBINDEX, TROPONINI in the last 168 hours. BNP: Invalid input(s): POCBNP CBG: Recent Labs  Lab 03/27/21 1837  GLUCAP 110*   D-Dimer No results for input(s): DDIMER in the last 72 hours. Hgb A1c No results for input(s): HGBA1C in the last 72 hours. Lipid Profile No results for input(s): CHOL, HDL, LDLCALC, TRIG, CHOLHDL, LDLDIRECT in the last 72 hours. Thyroid function studies No results for input(s): TSH, T4TOTAL, T3FREE, THYROIDAB in the last 72 hours.  Invalid input(s): FREET3 Anemia work up No results for input(s): VITAMINB12, FOLATE, FERRITIN, TIBC, IRON, RETICCTPCT in the last 72 hours. Urinalysis    Component Value Date/Time   COLORURINE YELLOW 03/27/2021 1842   APPEARANCEUR CLEAR 03/27/2021 1842   APPEARANCEUR Clear 12/07/2013 0133   LABSPEC <1.005 (L) 03/27/2021 1842   LABSPEC 1.006 12/07/2013 0133   PHURINE 6.0 03/27/2021 1842   GLUCOSEU NEGATIVE 03/27/2021 1842   GLUCOSEU Negative 12/07/2013 0133   HGBUR NEGATIVE 03/27/2021 1842   BILIRUBINUR NEGATIVE 03/27/2021 1842   BILIRUBINUR Negative 12/07/2013 0133   KETONESUR NEGATIVE 03/27/2021 1842   PROTEINUR NEGATIVE 03/27/2021 1842   UROBILINOGEN 0.2 04/28/2015 1559   NITRITE NEGATIVE 03/27/2021 1842   LEUKOCYTESUR NEGATIVE 03/27/2021 1842   LEUKOCYTESUR Negative 12/07/2013 0133   Sepsis  Labs Invalid input(s): PROCALCITONIN,  WBC,  LACTICIDVEN Microbiology Recent Results (from the past 240 hour(s))  Resp Panel by RT-PCR (Flu A&B, Covid) Nasopharyngeal Swab     Status: None   Collection Time: 03/27/21  7:16 PM   Specimen: Nasopharyngeal Swab; Nasopharyngeal(NP) swabs in vial transport medium  Result Value Ref Range Status   SARS Coronavirus 2 by RT PCR NEGATIVE NEGATIVE Final    Comment: (NOTE) SARS-CoV-2 target nucleic acids are NOT DETECTED.  The SARS-CoV-2 RNA is generally detectable in upper respiratory specimens during the acute phase of infection. The lowest concentration of SARS-CoV-2 viral copies this assay can detect is 138 copies/mL. A negative result does not preclude SARS-Cov-2 infection and should not be used as the sole basis for treatment or other patient management decisions. A negative result may occur with  improper specimen collection/handling, submission of specimen other than nasopharyngeal swab, presence of viral mutation(s) within the areas targeted by this assay, and inadequate number of viral copies(<138 copies/mL). A negative result must be combined with clinical observations, patient history, and epidemiological information. The expected result is Negative.  Fact Sheet for Patients:  EntrepreneurPulse.com.au  Fact Sheet for Healthcare Providers:  IncredibleEmployment.be  This test is no t yet approved or cleared by the [place] FDA and  has been authorized for detection and/or diagnosis of SARS-CoV-2 by FDA under an Emergency Use Authorization (EUA). This EUA will remain  in effect (meaning this test can be used) for the duration of the COVID-19 declaration under  Section 564(b)(1) of the Act, 21 U.S.C.section 360bbb-3(b)(1), unless the authorization is terminated  or revoked sooner.       Influenza A by PCR NEGATIVE NEGATIVE Final   Influenza B by PCR NEGATIVE NEGATIVE Final    Comment:  (NOTE) The Xpert Xpress SARS-CoV-2/FLU/RSV plus assay is intended as an aid in the diagnosis of influenza from Nasopharyngeal swab specimens and should not be used as a sole basis for treatment. Nasal washings and aspirates are unacceptable for Xpert Xpress SARS-CoV-2/FLU/RSV testing.  Fact Sheet for Patients: EntrepreneurPulse.com.au  Fact Sheet for Healthcare Providers: IncredibleEmployment.be  This test is not yet approved or cleared by the [place] FDA and has been authorized for detection and/or diagnosis of SARS-CoV-2 by FDA under an Emergency Use Authorization (EUA). This EUA will remain in effect (meaning this test can be used) for the duration of the COVID-19 declaration under Section 564(b)(1) of the Act, 21 U.S.C. section 360bbb-3(b)(1), unless the authorization is terminated or revoked.  Performed at [facility], [address], [place], [place] [address]   MRSA Next Gen by PCR, Nasal     Status: None   Collection Time: 03/28/21  1:26 PM   Specimen: Nasal Mucosa; Nasal Swab  Result Value Ref Range Status   MRSA by PCR Next Gen NOT DETECTED NOT DETECTED Final    Comment: (NOTE) The GeneXpert MRSA Assay (FDA approved for NASAL specimens only), is one component of a comprehensive MRSA colonization surveillance program. It is not intended to diagnose MRSA infection nor to guide or monitor treatment for MRSA infections. Test performance is not FDA approved in patients less than 60 years old. Performed at [facility], [address] [place], [place] [address]      Time coordinating discharge: 35 minutes  SIGNED:   Rodena Goldmann, DO [facility] 04/02/2021, 10:14 AM  If 7PM-7AM, please contact night-coverage [email or web address]

## 2021-04-02 NOTE — Evaluation (Signed)
Physical Therapy Evaluation Patient Details Name: Olivia Lester MRN: [id number] DOB: 05/13/1976 Today's Date: 04/02/2021   History of Present Illness  Olivia Lester is a 44 y.o. female with medical history significant of alcohol abuse, unspecified back injury PUD with gastric ulcer, history of hepatitis C, history of nephrolithiasis, right foot fracture, history of pancreatitis, panic attacks, history of pneumonia, polysubstance abuse, history of seizures who was brought by EMS after becoming unresponsive due to presumptive overdose with alcohol and maybe on other substances.  EMS responded on scene with CPR and provided 8 mg of Narcan with some response from the patient.  She was brought to the emergency department was intubated for airway protection.  She is unable to provide any history at this time.   Clinical Impression  Patient with intermittent unsteadiness throughout session secondary to impaired dynamic balance and R knee pain. Patient ambulates with antalgic gait with limited R knee flexion/extension ROM due to knee pain from previous injury. Patient provided with min guard and HHA for safety/balance with ambulation. Patient would benefit from OPPT for further evaluation of chronic R knee pain. She would also benefit from a RW to reduce risk of falling due to unsteadiness with ambulation. Patient discharged to care of nursing for ambulation daily as tolerated for length of stay.     Follow Up Recommendations Outpatient PT    Equipment Recommendations  Rolling walker with 5" wheels    Recommendations for Other Services       Precautions / Restrictions Precautions Precautions: Fall Restrictions Weight Bearing Restrictions: No      Mobility  Bed Mobility               General bed mobility comments: seated EOB    Transfers Overall transfer level: Modified independent Equipment used: None             General transfer comment: slightly unsteady upon standing reaching for  support  Ambulation/Gait Ambulation/Gait assistance: Min guard Gait Distance (Feet): 100 Feet Assistive device: 1 person hand held assist       General Gait Details: Patient with antalgic gait on RLE with limited knee flexion/extension ROM secondary to c/o R knee pain from previous injury, intermittent unsteadiness with HHA and reaching for near by wall/counter for support  Stairs            Wheelchair Mobility    Modified Rankin (Stroke Patients Only)       Balance Overall balance assessment: Needs assistance   Sitting balance-Leahy Scale: Normal Sitting balance - Comments: seated EOB   Standing balance support: During functional activity;No upper extremity supported Standing balance-Leahy Scale: Fair Standing balance comment: without AD                             Pertinent Vitals/Pain Pain Assessment: 0-10 Pain Score: 5  Pain Location: R knee Pain Descriptors / Indicators: Sore Pain Intervention(s): Limited activity within patient's tolerance;Monitored during session    Home Living Family/patient expects to be discharged to:: Private residence Living Arrangements: Spouse/significant other Available Help at Discharge: Family Type of Home: House Home Access: Stairs to enter Entrance Stairs-Rails: Left Entrance Stairs-Number of Steps: 3 Home Layout: One level Home Equipment: None      Prior Function Level of Independence: Independent         Comments: Patient states community ambulation, independent with basic ADL     Hand Dominance        Extremity/Trunk Assessment     Upper Extremity Assessment Upper Extremity Assessment: Overall WFL for tasks assessed    Lower Extremity Assessment Lower Extremity Assessment: Overall WFL for tasks assessed;Generalized weakness    Cervical / Trunk Assessment Cervical / Trunk Assessment: Normal  Communication   Communication: Other (comment) (slurred speech)  Cognition Arousal/Alertness:  Awake/alert Behavior During Therapy: WFL for tasks assessed/performed Overall Cognitive Status: Within Functional Limits for tasks assessed                                        General Comments      Exercises     Assessment/Plan    PT Assessment All further PT needs can be met in the next venue of care  PT Problem List Decreased strength;Decreased mobility;Decreased range of motion;Decreased activity tolerance;Decreased balance       PT Treatment Interventions      PT Goals (Current goals can be found in the Care Plan section)  Acute Rehab PT Goals Patient Stated Goal: return home PT Goal Formulation: With patient Time For Goal Achievement: 04/02/21 Potential to Achieve Goals: Good    Frequency     Barriers to discharge        Co-evaluation               AM-PAC PT "6 Clicks" Mobility  Outcome Measure Help needed turning from your back to your side while in a flat bed without using bedrails?: None Help needed moving from lying on your back to sitting on the side of a flat bed without using bedrails?: None Help needed moving to and from a bed to a chair (including a wheelchair)?: None Help needed standing up from a chair using your arms (e.g., wheelchair or bedside chair)?: None Help needed to walk in hospital room?: A Little Help needed climbing 3-5 steps with a railing? : A Little 6 Click Score: 22    End of Session   Activity Tolerance: Patient tolerated treatment well Patient left: in bed;with call bell/phone within reach Nurse Communication: Mobility status PT Visit Diagnosis: Unsteadiness on feet (R26.81);Other abnormalities of gait and mobility (R26.89);Muscle weakness (generalized) (M62.81)    Time: 1610-9604 PT Time Calculation (min) (ACUTE ONLY): 11 min   Charges:   PT Evaluation $PT Eval Low Complexity: 1 Low          9:06 AM, 04/02/21 Mearl Latin PT, DPT Physical Therapist at [facility]

## 2021-05-13 ENCOUNTER — Encounter (HOSPITAL_COMMUNITY): Payer: Self-pay | Admitting: *Deleted

## 2021-05-13 ENCOUNTER — Emergency Department (HOSPITAL_COMMUNITY)
Admission: EM | Admit: 2021-05-13 | Discharge: 2021-05-14 | Disposition: A | Discharge status: Home / Self Care | Payer: Medicaid Other | Attending: Emergency Medicine | Admitting: Emergency Medicine

## 2021-05-13 ENCOUNTER — Emergency Department (HOSPITAL_COMMUNITY)
Admission: EM | Admit: 2021-05-13 | Discharge: 2021-05-13 | Discharge status: Against Medical Advice | Payer: Medicaid Other | Attending: Emergency Medicine | Admitting: Emergency Medicine

## 2021-05-13 ENCOUNTER — Emergency Department (HOSPITAL_COMMUNITY): Payer: Medicaid Other

## 2021-05-13 ENCOUNTER — Other Ambulatory Visit: Payer: Self-pay

## 2021-05-13 ENCOUNTER — Encounter (HOSPITAL_COMMUNITY): Payer: Self-pay | Admitting: Emergency Medicine

## 2021-05-13 DIAGNOSIS — R569 Unspecified convulsions: Secondary | ICD-10-CM

## 2021-05-13 DIAGNOSIS — F1020 Alcohol dependence, uncomplicated: Secondary | ICD-10-CM | POA: Insufficient documentation

## 2021-05-13 DIAGNOSIS — F10930 Alcohol use, unspecified with withdrawal, uncomplicated: Secondary | ICD-10-CM

## 2021-05-13 DIAGNOSIS — I10 Essential (primary) hypertension: Secondary | ICD-10-CM | POA: Insufficient documentation

## 2021-05-13 DIAGNOSIS — Y900 Blood alcohol level of less than 20 mg/100 ml: Secondary | ICD-10-CM | POA: Insufficient documentation

## 2021-05-13 DIAGNOSIS — F17210 Nicotine dependence, cigarettes, uncomplicated: Secondary | ICD-10-CM | POA: Insufficient documentation

## 2021-05-13 DIAGNOSIS — D696 Thrombocytopenia, unspecified: Secondary | ICD-10-CM

## 2021-05-13 DIAGNOSIS — D649 Anemia, unspecified: Secondary | ICD-10-CM

## 2021-05-13 DIAGNOSIS — R1110 Vomiting, unspecified: Secondary | ICD-10-CM | POA: Insufficient documentation

## 2021-05-13 DIAGNOSIS — R7401 Elevation of levels of liver transaminase levels: Secondary | ICD-10-CM

## 2021-05-13 DIAGNOSIS — F419 Anxiety disorder, unspecified: Secondary | ICD-10-CM | POA: Insufficient documentation

## 2021-05-13 DIAGNOSIS — E876 Hypokalemia: Secondary | ICD-10-CM

## 2021-05-13 DIAGNOSIS — Y906 Blood alcohol level of 120-199 mg/100 ml: Secondary | ICD-10-CM | POA: Insufficient documentation

## 2021-05-13 DIAGNOSIS — R000 Tachycardia, unspecified: Secondary | ICD-10-CM | POA: Insufficient documentation

## 2021-05-13 DIAGNOSIS — R251 Tremor, unspecified: Secondary | ICD-10-CM | POA: Insufficient documentation

## 2021-05-13 LAB — COMPREHENSIVE METABOLIC PANEL
ALT: 65 U/L — ABNORMAL HIGH (ref 0–44)
ALT: 62 U/L — ABNORMAL HIGH (ref 0–44)
AST: 94 U/L — ABNORMAL HIGH (ref 15–41)
AST: 79 U/L — ABNORMAL HIGH (ref 15–41)
Albumin: 4.4 g/dL (ref 3.5–5.0)
Albumin: 4.1 g/dL (ref 3.5–5.0)
Alkaline Phosphatase: 59 U/L (ref 38–126)
Alkaline Phosphatase: 53 U/L (ref 38–126)
Anion gap: 11 (ref 5–15)
Anion gap: 13 (ref 5–15)
BUN: 13 mg/dL (ref 6–20)
BUN: 13 mg/dL (ref 6–20)
CO2: 29 mmol/L (ref 22–32)
CO2: 27 mmol/L (ref 22–32)
Calcium: 9.1 mg/dL (ref 8.9–10.3)
Calcium: 8.6 mg/dL — ABNORMAL LOW (ref 8.9–10.3)
Chloride: 100 mmol/L (ref 98–111)
Chloride: 94 mmol/L — ABNORMAL LOW (ref 98–111)
Creatinine, Ser: 0.39 mg/dL — ABNORMAL LOW (ref 0.44–1.00)
Creatinine, Ser: 0.52 mg/dL (ref 0.44–1.00)
GFR, Estimated: >60 mL/min (ref >60)
GFR, Estimated: >60 mL/min (ref >60)
Glucose, Bld: 85 mg/dL (ref 70–99)
Glucose, Bld: 85 mg/dL (ref 70–99)
Potassium: 3 mmol/L — ABNORMAL LOW (ref 3.5–5.1)
Potassium: 2.9 mmol/L — ABNORMAL LOW (ref 3.5–5.1)
Sodium: 140 mmol/L (ref 135–145)
Sodium: 134 mmol/L — ABNORMAL LOW (ref 135–145)
Total Bilirubin: 0.4 mg/dL (ref 0.3–1.2)
Total Bilirubin: 0.4 mg/dL (ref 0.3–1.2)
Total Protein: 7 g/dL (ref 6.5–8.1)
Total Protein: 6.6 g/dL (ref 6.5–8.1)

## 2021-05-13 LAB — LACTIC ACID, PLASMA: Lactic Acid, Venous: 6.2 mmol/L (ref 0.5–1.9)

## 2021-05-13 LAB — RAPID URINE DRUG SCREEN, HOSP PERFORMED
Amphetamines: NOT DETECTED
Barbiturates: NOT DETECTED
Benzodiazepines: NOT DETECTED
Cocaine: NOT DETECTED
Opiates: NOT DETECTED
Tetrahydrocannabinol: NOT DETECTED

## 2021-05-13 LAB — CBC WITH DIFFERENTIAL/PLATELET
Abs Immature Granulocytes: 0.03 10*3/uL (ref 0.00–0.07)
Basophils Absolute: 0 10*3/uL (ref 0.0–0.1)
Basophils Relative: 0 %
Eosinophils Absolute: 0.1 10*3/uL (ref 0.0–0.5)
Eosinophils Relative: 1 %
HCT: 34.9 % — ABNORMAL LOW (ref 36.0–46.0)
Hemoglobin: 11.7 g/dL — ABNORMAL LOW (ref 12.0–15.0)
Immature Granulocytes: 0 %
Lymphocytes Relative: 29 %
Lymphs Abs: 2.3 10*3/uL (ref 0.7–4.0)
MCH: 31.5 pg (ref 26.0–34.0)
MCHC: 33.5 g/dL (ref 30.0–36.0)
MCV: 94.1 fL (ref 80.0–100.0)
Monocytes Absolute: 0.5 10*3/uL (ref 0.1–1.0)
Monocytes Relative: 6 %
Neutro Abs: 5.1 10*3/uL (ref 1.7–7.7)
Neutrophils Relative %: 64 %
Platelets: 98 10*3/uL — ABNORMAL LOW (ref 150–400)
RBC: 3.71 MIL/uL — ABNORMAL LOW (ref 3.87–5.11)
RDW: 14.2 % (ref 11.5–15.5)
WBC: 8 10*3/uL (ref 4.0–10.5)
nRBC: 0 % (ref 0.0–0.2)

## 2021-05-13 LAB — MAGNESIUM: Magnesium: 1.4 mg/dL — ABNORMAL LOW (ref 1.7–2.4)

## 2021-05-13 LAB — AMMONIA: Ammonia: 21 umol/L (ref 9–35)

## 2021-05-13 LAB — ETHANOL
Alcohol, Ethyl (B): 192 mg/dL — ABNORMAL HIGH (ref <10)
Alcohol, Ethyl (B): <10 mg/dL (ref <10)

## 2021-05-13 MED ORDER — SODIUM CHLORIDE 0.9 % IV SOLN: INTRAVENOUS | Status: DC

## 2021-05-13 MED ORDER — LORAZEPAM 2 MG/ML IJ SOLN
1.0000 mg | Freq: Once | INTRAMUSCULAR | Status: AC
  Administered 2021-05-13: 1 mg via INTRAVENOUS
  Filled 2021-05-13: qty 1

## 2021-05-13 MED ORDER — SODIUM CHLORIDE 0.9 % IV BOLUS
2000.0000 mL | Freq: Once | INTRAVENOUS | Status: AC
  Administered 2021-05-14: 2000 mL via INTRAVENOUS

## 2021-05-13 MED ORDER — LORAZEPAM 2 MG/ML IJ SOLN
2.0000 mg | Freq: Once | INTRAMUSCULAR | Status: AC
  Administered 2021-05-13: 2 mg via INTRAVENOUS
  Filled 2021-05-13: qty 1

## 2021-05-13 NOTE — ED Triage Notes (Signed)
Pt c/o seizure this morning, yesterday and Monday. Pt reports she typically has seizures when she is withdrawing from alcohol. Pt is very anxious and tearful. EMS reports she hasn't ate since Sunday. Pt also c/o headache.

## 2021-05-13 NOTE — ED Triage Notes (Signed)
Pt states she was here earlier today and left AMA. Pt states she had another seizure on the way here tonight. Pt very anxious at this time. Pt states she has had 3 shots of liquor since leaving AMA.

## 2021-05-13 NOTE — ED Notes (Signed)
Pt walked out AMA. Staff removed IV.

## 2021-05-13 NOTE — ED Provider Notes (Signed)
[facility] EMERGENCY DEPARTMENT Provider Note   CSN: [id number] Arrival date & time: 05/13/21  7371     History Chief Complaint  Patient presents with   Seizures    Olivia Lester is a 45 y.o. female.  Patient presented with a questionable seizure.  Patient has a history of significant EtOH abuse and seizures related to alcohol  The history is provided by the patient and medical records. No language interpreter was used.  Seizures Seizure activity on arrival: no   Seizure type:  Focal Preceding symptoms: no sensation of an aura present   Initial focality:  None Postictal symptoms: no confusion   Return to baseline: yes   Severity:  Moderate Timing:  Once Progression:  Unchanged Context: alcohol withdrawal   Recent head injury:  No recent head injuries PTA treatment:  None History of seizures: no       Past Medical History:  Diagnosis Date   Alcohol abuse    Back injury    Gastric ulcer    Gastric ulcer 10/14/2013   Hepatitis C 10/14/2013   History of kidney stones    Nonunion of foot fracture    right 5th metatarsal   Pancreatitis, acute    Panic attack    Pneumonia    Polysubstance abuse ([place])    PONV (postoperative nausea and vomiting)    Seizures ([place])     Patient Active Problem List   Diagnosis Date Noted   Acute encephalopathy 03/27/2021   Recurrent pancreatitis 07/07/2020   Acute on chronic pancreatitis ([place]) 07/07/2020   Alcohol withdrawal seizure ([place]) 07/07/2020   Leukocytosis 07/07/2020   Acute pancreatitis 07/07/2020   Seizure ([place]) 06/10/2020   Abnormal LFTs 06/10/2020   AKI (acute kidney injury) ([place]) 06/10/2020   Fall 06/10/2020   Hyponatremia 06/10/2020   Laceration of forehead    Sepsis due to Escherichia coli (E. coli) ([place]) 04/20/2020   Acute pyelonephritis 04/18/2020   Sepsis ([place]) 04/17/2020   Fatty liver, alcoholic/Severe hepatic steatosis 01/29/2020   Pancreatitis, recurrent 01/26/2020   Colitis 10/28/2019   Polysubstance abuse  ([place]) 10/28/2019   Hypomagnesemia 01/18/9484   Alcoholic hepatitis without ascites--Severe hepatic steatosis 46/27/0350   Alcoholic gastritis 09/38/1829   DTs (delirium tremens) ([place])    Urinary tract infection without hematuria    Alcohol intoxication ([place]) 09/04/2019   Alcohol withdrawal ([place]) 06/24/2019   Sinus tachycardia 06/24/2019   Essential hypertension 06/24/2019   Hypokalemia 03/31/2019   Cannabis abuse 03/11/2019   Elevated transaminase level    Alcohol abuse    Closed displaced fracture of fifth metatarsal bone of right foot 08/02/2018   Dental abscess 12/25/2017   Gastric ulcer 10/14/2013   Transaminitis 10/14/2013   Tobacco dependence 10/14/2013   Hepatitis C 10/14/2013   Thrombocytopenia ([place]) 10/13/2013   Duodenitis 10/12/2013   Alcoholism ([place]) 10/12/2013   Abdominal pain, epigastric 10/12/2013   ANKLE PAIN 01/01/2008    Past Surgical History:  Procedure Laterality Date   BACK SURGERY     BREAST SURGERY Left    lumpectomy   ESOPHAGOGASTRODUODENOSCOPY (EGD) WITH PROPOFOL N/A 10/14/2013   Procedure: ESOPHAGOGASTRODUODENOSCOPY (EGD) WITH PROPOFOL;  Surgeon: Rogene Houston, MD;  Location: [facility];  Service: Endoscopy;  Laterality: N/A;   ORIF TOE FRACTURE Right 08/08/2018   Procedure: RIGHT FOOT INTERNAL FIXATION 5TH METATARSAL;  Surgeon: Newt Minion, MD;  Location: [facility];  Service: Orthopedics;  Laterality: Right;   TONSILLECTOMY       OB History     Gravida  20   Para  3   Term  3   Preterm      AB  4   Living  3      SAB  2   IAB  2   Ectopic      Multiple      Live Births  3           Family History  Problem Relation Age of Onset   Cancer Mother 60       ovarian    Hypertension Mother    Stroke Mother    Early death Father    Alcohol abuse Father     Social History   Tobacco Use   Smoking status: Every Day    Packs/day: 1.00    Years: 20.00    Pack years: 20.00    Types: Cigarettes   Smokeless tobacco: Never   Vaping Use   Vaping Use: Never used  Substance Use Topics   Alcohol use: Yes    Alcohol/week: 18.0 standard drinks    Types: 18 Cans of beer per week    Comment: 1/5 -1/2 gallon of liqour daily or 18 beers daily   Drug use: Yes    Types: Marijuana    Comment: denies current cocaine/meth...current mj    Home Medications Prior to Admission medications   Medication Sig Start Date End Date Taking? Authorizing Provider  amitriptyline (ELAVIL) 50 MG tablet Take 50 mg by mouth at bedtime. 03/24/21   [provider]  amLODipine (NORVASC) 5 MG tablet Take 1 tablet (5 mg total) by mouth daily. Patient not taking: Reported on 10/01/2020 10/28/19   Roxan Hockey, MD  chlordiazePOXIDE (LIBRIUM) 25 MG capsule 50mg  PO TID x 1D, then 25-50mg  PO BID X 1D, then 25-50mg  PO QD X 1D 04/02/21   Manuella Ghazi, Pratik D, DO  folic acid (FOLVITE) 1 MG tablet Take 1 tablet (1 mg total) by mouth daily. 10/29/19   Roxan Hockey, MD  lidocaine (LIDODERM) 5 % Place 1 patch onto the skin daily. Remove & Discard patch within 12 hours or as directed by MD 07/12/20   Heath Lark D, DO  lipase/protease/amylase 24000-76000 units CPEP Take 1 capsule (24,000 Units total) by mouth 3 (three) times daily before meals. Patient not taking: No sig reported 10/29/19   Roxan Hockey, MD  methocarbamol (ROBAXIN) 500 MG tablet Take 1 tablet (500 mg total) by mouth every 8 (eight) hours as needed for muscle spasms. 07/11/20   Manuella Ghazi, Pratik D, DO  Multiple Vitamin (MULTIVITAMIN WITH MINERALS) TABS tablet Take 1 tablet by mouth every other day.  Patient not taking: No sig reported    [provider]  pantoprazole (PROTONIX) 40 MG tablet Take 1 tablet (40 mg total) by mouth daily. 06/03/20 09/01/20  Harvest Dark, MD  potassium chloride SA (KLOR-CON) 20 MEQ tablet Take 20 mEq by mouth 2 (two) times daily. 3 day supply last filled 06-25-20 06/25/20   [provider]  promethazine (PHENERGAN) 25 MG tablet Take 1 tablet  (25 mg total) by mouth every 6 (six) hours as needed for nausea or vomiting. 08/10/20   Veryl Speak, MD    Allergies    Toradol [ketorolac tromethamine], Acetaminophen, and Asa [aspirin]  Review of Systems   Review of Systems  Constitutional:  Negative for appetite change and fatigue.  HENT:  Negative for congestion, ear discharge and sinus pressure.   Eyes:  Negative for discharge.  Respiratory:  Negative for cough.   Cardiovascular:  Negative for chest pain.  Gastrointestinal:  Negative for abdominal pain and diarrhea.  Genitourinary:  Negative for frequency and hematuria.  Musculoskeletal:  Negative for back pain.  Skin:  Negative for rash.  Neurological:  Positive for seizures. Negative for headaches.  Psychiatric/Behavioral:  Negative for hallucinations.    Physical Exam Updated Vital Signs BP 112/83   Pulse (!) 102   Temp 98.3 F (36.8 C) (Oral)   Resp 18   Ht 5\' 7"  (1.702 m)   Wt 45.3 kg   SpO2 99%   BMI 15.64 kg/m   Physical Exam Vitals and nursing note reviewed.  Constitutional:      Appearance: She is well-developed.  HENT:     Head: Normocephalic.     Nose: Nose normal.  Eyes:     General: No scleral icterus.    Conjunctiva/sclera: Conjunctivae normal.  Neck:     Thyroid: No thyromegaly.  Cardiovascular:     Rate and Rhythm: Normal rate and regular rhythm.     Heart sounds: No murmur heard.   No friction rub. No gallop.  Pulmonary:     Breath sounds: No stridor. No wheezing or rales.  Chest:     Chest wall: No tenderness.  Abdominal:     General: There is no distension.     Tenderness: There is no abdominal tenderness. There is no rebound.  Musculoskeletal:        General: Normal range of motion.     Cervical back: Neck supple.  Lymphadenopathy:     Cervical: No cervical adenopathy.  Skin:    Findings: No erythema or rash.  Neurological:     Mental Status: She is alert and oriented to person, place, and time.     Motor: No abnormal muscle  tone.     Coordination: Coordination normal.     Comments: Very anxious with tremors in both her upper extremities  Psychiatric:        Behavior: Behavior normal.    ED Results / Procedures / Treatments   Labs (all labs ordered are listed, but only abnormal results are displayed) Labs Reviewed  COMPREHENSIVE METABOLIC PANEL - Abnormal; Notable for the following components:      Result Value   Potassium 3.0 (*)    Creatinine, Ser 0.39 (*)    AST 94 (*)    ALT 65 (*)    All other components within normal limits  ETHANOL - Abnormal; Notable for the following components:   Alcohol, Ethyl (B) 192 (*)    All other components within normal limits  RAPID URINE DRUG SCREEN, HOSP PERFORMED  CBC WITH DIFFERENTIAL/PLATELET    EKG None  Radiology DG Chest Port 1 View  Result Date: 05/13/2021 CLINICAL DATA:  Weakness, seizure EXAM: PORTABLE CHEST 1 VIEW COMPARISON:  03/27/2021 FINDINGS: The heart size and mediastinal contours are within normal limits. No focal airspace consolidation, pleural effusion, or pneumothorax. The visualized skeletal structures are unremarkable. IMPRESSION: No active disease. Electronically Signed   By: Davina Poke D.O.   On: 05/13/2021 11:00    Procedures Procedures   Medications Ordered in ED Medications  LORazepam (ATIVAN) injection 1 mg (1 mg Intravenous Given by Other 05/13/21 1015)    ED Course  I have reviewed the triage vital signs and the nursing notes.  Pertinent labs & imaging results that were available during my care of the patient were reviewed by me and considered in my medical decision making (see chart for details).    MDM  Rules/Calculators/A&P                           Patient with alcohol withdrawal symptoms.  Possibly alcohol withdrawal seizure.  Patient left AMA before I was able to discuss the results of her test or get her CT head Final Clinical Impression(s) / ED Diagnoses Final diagnoses:  None    Rx / DC Orders ED  Discharge Orders     None        Milton Ferguson, MD 05/13/21 1152

## 2021-05-13 NOTE — ED Notes (Signed)
Date and time results received: 05/13/21 11:38 PM  Test: Lactic Acid Critical Value: 6.2  Name of Provider Notified: Dr. Sabra Heck  Orders Received? Or Actions Taken?:Verbal order for 2000 mL bolus received

## 2021-05-13 NOTE — ED Provider Notes (Signed)
[facility] EMERGENCY DEPARTMENT Provider Note   CSN: [id number] Arrival date & time: 05/13/21  2207     History Chief Complaint  Patient presents with   Emesis   Delirium Tremens (DTS)    Olivia Lester is a 45 y.o. female.   Emesis  This patient is a 45 year old female, she has a history of fairly severe alcohol abuse, her boyfriend who is here with her who is an additional historian reports that she has been drinking at least for 13 years that they have been together and that she was a heavy drinker with her husband prior to being with him.  She tells me that she drinks about 1/5 of liquor a day, there is a reported history of having alcohol withdrawal seizures and delirium tremens in the medical record.  She states that on Monday night she had some type of seizure activity, she was aware of it, she had no way to call 911 for help and was by herself without a phone, it resolved, the next day she continued drinking.  She was actually here earlier today with diffuse tremor thinking that she had had a seizure, she was tachycardic had an alcohol level of 190 and after receiving Ativan she got up out of the bed and walked out of the [place] with the IV in her arm.  She went home and because she was still shaking she decided to take 3 shots of liquor and lay down.  When her boyfriend came home and went back to the bedroom and lay down beside her she started shaking again and stated that she thought she was having a seizure thus she comes back to the emergency department with the symptoms.  The patient denies any vomiting diarrhea rashes headache or blurred vision.  She is anxious and tearful  Past Medical History:  Diagnosis Date   Alcohol abuse    Back injury    Gastric ulcer    Gastric ulcer 10/14/2013   Hepatitis C 10/14/2013   History of kidney stones    Nonunion of foot fracture    right 5th metatarsal   Pancreatitis, acute    Panic attack    Pneumonia    Polysubstance  abuse ([place])    PONV (postoperative nausea and vomiting)    Seizures ([place])     Patient Active Problem List   Diagnosis Date Noted   Acute encephalopathy 03/27/2021   Recurrent pancreatitis 07/07/2020   Acute on chronic pancreatitis ([place]) 07/07/2020   Alcohol withdrawal seizure ([place]) 07/07/2020   Leukocytosis 07/07/2020   Acute pancreatitis 07/07/2020   Seizure ([place]) 06/10/2020   Abnormal LFTs 06/10/2020   AKI (acute kidney injury) ([place]) 06/10/2020   Fall 06/10/2020   Hyponatremia 06/10/2020   Laceration of forehead    Sepsis due to Escherichia coli (E. coli) ([place]) 04/20/2020   Acute pyelonephritis 04/18/2020   Sepsis ([place]) 04/17/2020   Fatty liver, alcoholic/Severe hepatic steatosis 01/29/2020   Pancreatitis, recurrent 01/26/2020   Colitis 10/28/2019   Polysubstance abuse ([place]) 10/28/2019   Hypomagnesemia 48/25/0037   Alcoholic hepatitis without ascites--Severe hepatic steatosis 04/88/8916   Alcoholic gastritis 94/50/3888   DTs (delirium tremens) ([place])    Urinary tract infection without hematuria    Alcohol intoxication ([place]) 09/04/2019   Alcohol withdrawal ([place]) 06/24/2019   Sinus tachycardia 06/24/2019   Essential hypertension 06/24/2019   Hypokalemia 03/31/2019   Cannabis abuse 03/11/2019   Elevated transaminase level    Alcohol abuse    Closed displaced  fracture of fifth metatarsal bone of right foot 08/02/2018   Dental abscess 12/25/2017   Gastric ulcer 10/14/2013   Transaminitis 10/14/2013   Tobacco dependence 10/14/2013   Hepatitis C 10/14/2013   Thrombocytopenia ([place]) 10/13/2013   Duodenitis 10/12/2013   Alcoholism ([place]) 10/12/2013   Abdominal pain, epigastric 10/12/2013   ANKLE PAIN 01/01/2008    Past Surgical History:  Procedure Laterality Date   BACK SURGERY     BREAST SURGERY Left    lumpectomy   ESOPHAGOGASTRODUODENOSCOPY (EGD) WITH PROPOFOL N/A 10/14/2013   Procedure: ESOPHAGOGASTRODUODENOSCOPY (EGD) WITH PROPOFOL;  Surgeon: Rogene Houston, MD;   Location: [facility];  Service: Endoscopy;  Laterality: N/A;   ORIF TOE FRACTURE Right 08/08/2018   Procedure: RIGHT FOOT INTERNAL FIXATION 5TH METATARSAL;  Surgeon: Newt Minion, MD;  Location: [facility];  Service: Orthopedics;  Laterality: Right;   TONSILLECTOMY       OB History     Gravida  7   Para  3   Term  3   Preterm      AB  4   Living  3      SAB  2   IAB  2   Ectopic      Multiple      Live Births  3           Family History  Problem Relation Age of Onset   Cancer Mother 47       ovarian    Hypertension Mother    Stroke Mother    Early death Father    Alcohol abuse Father     Social History   Tobacco Use   Smoking status: Every Day    Packs/day: 1.00    Years: 20.00    Pack years: 20.00    Types: Cigarettes   Smokeless tobacco: Never  Vaping Use   Vaping Use: Never used  Substance Use Topics   Alcohol use: Yes    Alcohol/week: 18.0 standard drinks    Types: 18 Cans of beer per week    Comment: 1/5 -1/2 gallon of liqour daily or 18 beers daily   Drug use: Yes    Types: Marijuana    Comment: denies current cocaine/meth...current mj    Home Medications Prior to Admission medications   Medication Sig Start Date End Date Taking? Authorizing Provider  amitriptyline (ELAVIL) 50 MG tablet Take 50 mg by mouth at bedtime. 03/24/21   [provider]  amLODipine (NORVASC) 5 MG tablet Take 1 tablet (5 mg total) by mouth daily. Patient not taking: Reported on 10/01/2020 10/28/19   Roxan Hockey, MD  chlordiazePOXIDE (LIBRIUM) 25 MG capsule 50mg  PO TID x 1D, then 25-50mg  PO BID X 1D, then 25-50mg  PO QD X 1D 04/02/21   Manuella Ghazi, Pratik D, DO  folic acid (FOLVITE) 1 MG tablet Take 1 tablet (1 mg total) by mouth daily. 10/29/19   Roxan Hockey, MD  lidocaine (LIDODERM) 5 % Place 1 patch onto the skin daily. Remove & Discard patch within 12 hours or as directed by MD 07/12/20   Heath Lark D, DO  lipase/protease/amylase 24000-76000 units CPEP Take 1  capsule (24,000 Units total) by mouth 3 (three) times daily before meals. Patient not taking: No sig reported 10/29/19   Roxan Hockey, MD  methocarbamol (ROBAXIN) 500 MG tablet Take 1 tablet (500 mg total) by mouth every 8 (eight) hours as needed for muscle spasms. 07/11/20   Manuella Ghazi, Pratik D, DO  Multiple Vitamin (MULTIVITAMIN WITH MINERALS)  TABS tablet Take 1 tablet by mouth every other day.  Patient not taking: No sig reported    [provider]  pantoprazole (PROTONIX) 40 MG tablet Take 1 tablet (40 mg total) by mouth daily. 06/03/20 09/01/20  Harvest Dark, MD  potassium chloride SA (KLOR-CON) 20 MEQ tablet Take 20 mEq by mouth 2 (two) times daily. 3 day supply last filled 06-25-20 06/25/20   [provider]  promethazine (PHENERGAN) 25 MG tablet Take 1 tablet (25 mg total) by mouth every 6 (six) hours as needed for nausea or vomiting. 08/10/20   Veryl Speak, MD    Allergies    Toradol [ketorolac tromethamine], Acetaminophen, and Asa [aspirin]  Review of Systems   Review of Systems  Gastrointestinal:  Positive for vomiting.  All other systems reviewed and are negative.  Physical Exam Updated Vital Signs Ht 1.702 m (5\' 7" )   Wt 45.3 kg   BMI 15.64 kg/m   Physical Exam Vitals and nursing note reviewed.  Constitutional:      Appearance: She is well-developed.     Comments: Anxious appearing  HENT:     Head: Normocephalic and atraumatic.     Nose: Nose normal. No congestion or rhinorrhea.     Mouth/Throat:     Mouth: Mucous membranes are moist.     Pharynx: No oropharyngeal exudate.  Eyes:     General: No scleral icterus.       Right eye: No discharge.        Left eye: No discharge.     Conjunctiva/sclera: Conjunctivae normal.     Pupils: Pupils are equal, round, and reactive to light.  Neck:     Thyroid: No thyromegaly.     Vascular: No JVD.  Cardiovascular:     Rate and Rhythm: Regular rhythm. Tachycardia present.     Heart sounds: Normal heart  sounds. No murmur heard.   No friction rub. No gallop.     Comments: Heart rate of 120 bpm with normal pulses at the radial artery Pulmonary:     Effort: Pulmonary effort is normal. No respiratory distress.     Breath sounds: Normal breath sounds. No wheezing or rales.  Abdominal:     General: Bowel sounds are normal. There is no distension.     Palpations: Abdomen is soft. There is no mass.     Tenderness: There is no abdominal tenderness.  Musculoskeletal:        General: No swelling or tenderness. Normal range of motion.     Cervical back: Normal range of motion and neck supple.     Right lower leg: No edema.     Left lower leg: No edema.  Lymphadenopathy:     Cervical: No cervical adenopathy.  Skin:    General: Skin is warm and dry.     Findings: No erythema or rash.  Neurological:     Mental Status: She is alert.     Coordination: Coordination normal.     Comments: The patient is able to speak in full sentences, she has tremor of her head and her arms and her legs, her whole body has a mild tremor, she is totally normal mental status during this time.  She is able to lift both hands lift both legs shake hands and follow commands while she is having the tremor.  Psychiatric:     Comments: Anxious and mildly tearful    ED Results / Procedures / Treatments   Labs (all labs ordered are listed,  but only abnormal results are displayed) Labs Reviewed  CBC  COMPREHENSIVE METABOLIC PANEL  AMMONIA  ETHANOL  LACTIC ACID, PLASMA  MAGNESIUM    EKG None  Radiology DG Chest Port 1 View  Result Date: 05/13/2021 CLINICAL DATA:  Weakness, seizure EXAM: PORTABLE CHEST 1 VIEW COMPARISON:  03/27/2021 FINDINGS: The heart size and mediastinal contours are within normal limits. No focal airspace consolidation, pleural effusion, or pneumothorax. The visualized skeletal structures are unremarkable. IMPRESSION: No active disease. Electronically Signed   By: Davina Poke D.O.   On:  05/13/2021 11:00    Procedures Procedures   Medications Ordered in ED Medications  LORazepam (ATIVAN) injection 2 mg (has no administration in time range)  0.9 %  sodium chloride infusion (has no administration in time range)    ED Course  I have reviewed the triage vital signs and the nursing notes.  Pertinent labs & imaging results that were available during my care of the patient were reviewed by me and considered in my medical decision making (see chart for details).  Clinical Course as of 05/13/21 2332  Thu May 13, 2021  4650 Chronic alcoholic, presents with diffuse tremor, second visit in the day after leaving [place] earlier today, given Ativan, she is anxious tearful and shaky.  Was intoxicated with alcohol of 190 at prior visit, labs pending for this 1, Ativan given.  May need admission for alcohol withdrawal. 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  She will be given some Ativan and some IV fluids while we check labs.  The patient wants to stop drinking according to what she states because she is worried about her health but thinks that she can do it by herself though she left [place] earlier today.  I had a discussion with the patient, we will need to further evaluate prior to considering Librium or outpatient therapy or detox.  Do not think she needs a CT scan of the brain at this time, no headache, normal neurologically except for the tremor  Change of shift, care signed out to oncoming emergency department physician to follow-up results and disposition  accordingly  Final Clinical Impression(s) / ED Diagnoses Final diagnoses:  None    Rx / DC Orders ED Discharge Orders     None        Noemi Chapel, MD 05/13/21 2332

## 2021-05-14 LAB — DIFFERENTIAL
Abs Immature Granulocytes: 0.04 10*3/uL (ref 0.00–0.07)
Basophils Absolute: 0 10*3/uL (ref 0.0–0.1)
Basophils Relative: 0 %
Eosinophils Absolute: 0.1 10*3/uL (ref 0.0–0.5)
Eosinophils Relative: 1 %
Immature Granulocytes: 1 %
Lymphocytes Relative: 36 %
Lymphs Abs: 3.1 10*3/uL (ref 0.7–4.0)
Monocytes Absolute: 0.7 10*3/uL (ref 0.1–1.0)
Monocytes Relative: 8 %
Neutro Abs: 4.7 10*3/uL (ref 1.7–7.7)
Neutrophils Relative %: 54 %

## 2021-05-14 LAB — CBC
HCT: 30.9 % — ABNORMAL LOW (ref 36.0–46.0)
Hemoglobin: 10.6 g/dL — ABNORMAL LOW (ref 12.0–15.0)
MCH: 32.3 pg (ref 26.0–34.0)
MCHC: 34.3 g/dL (ref 30.0–36.0)
MCV: 94.2 fL (ref 80.0–100.0)
Platelets: 87 10*3/uL — ABNORMAL LOW (ref 150–400)
RBC: 3.28 MIL/uL — ABNORMAL LOW (ref 3.87–5.11)
RDW: 14.1 % (ref 11.5–15.5)
WBC: 8.6 10*3/uL (ref 4.0–10.5)
nRBC: 0 % (ref 0.0–0.2)

## 2021-05-14 LAB — LACTIC ACID, PLASMA: Lactic Acid, Venous: 0.8 mmol/L (ref 0.5–1.9)

## 2021-05-14 MED ORDER — CHLORDIAZEPOXIDE HCL 25 MG PO CAPS: ORAL_CAPSULE | ORAL | 0 refills | Status: DC

## 2021-05-14 MED ORDER — MAGNESIUM SULFATE 2 GM/50ML IV SOLN
2.0000 g | Freq: Once | INTRAVENOUS | Status: AC
  Administered 2021-05-14: 2 g via INTRAVENOUS
  Filled 2021-05-14: qty 50

## 2021-05-14 MED ORDER — POTASSIUM CHLORIDE CRYS ER 20 MEQ PO TBCR
40.0000 meq | EXTENDED_RELEASE_TABLET | Freq: Once | ORAL | Status: AC
  Administered 2021-05-14: 40 meq via ORAL
  Filled 2021-05-14: qty 2

## 2021-05-14 MED ORDER — MAGNESIUM 30 MG PO TABS: 30.0000 mg | ORAL_TABLET | Freq: Two times a day (BID) | ORAL | 0 refills | Status: DC

## 2021-05-14 MED ORDER — POTASSIUM CHLORIDE CRYS ER 20 MEQ PO TBCR: 20.0000 meq | EXTENDED_RELEASE_TABLET | Freq: Two times a day (BID) | ORAL | 0 refills | Status: DC

## 2021-05-14 MED ORDER — POTASSIUM CHLORIDE 10 MEQ/100ML IV SOLN
10.0000 meq | Freq: Once | INTRAVENOUS | Status: AC
  Administered 2021-05-14: 10 meq via INTRAVENOUS
  Filled 2021-05-14: qty 100

## 2021-05-14 NOTE — ED Provider Notes (Signed)
Care assumed from Dr. Sabra Heck, patient with possible impending delirium tremens.  Lactic acid level was elevated and she was given IV fluids.  Repeat lactic acid level is normal.  Labs additionally showed significant hypokalemia and hypomagnesemia.  However heart rate and blood pressure are now normal and patient does not appear to need hospitalization.  She is still slightly tremulous.  She is agreeable to pursue outpatient detox.  She is given oral potassium, intravenous potassium and magnesium.  She is discharged with prescriptions for potassium chloride, magnesium, chlordiazepoxide and given outpatient resources for drug rehabilitation.  CRITICAL CARE Performed by: Delora Fuel Total critical care time: 35 minutes Critical care time was exclusive of separately billable procedures and treating other patients. Critical care was necessary to treat or prevent imminent or life-threatening deterioration. Critical care was time spent personally by me on the following activities: development of treatment plan with patient and/or surrogate as well as nursing, discussions with consultants, evaluation of patient's response to treatment, examination of patient, obtaining history from patient or surrogate, ordering and performing treatments and interventions, ordering and review of laboratory studies, ordering and review of radiographic studies, pulse oximetry and re-evaluation of patient's condition.   Delora Fuel, MD 80/06/34 [phone]

## 2021-05-17 LAB — PATHOLOGIST SMEAR REVIEW

## 2021-08-15 ENCOUNTER — Encounter (HOSPITAL_COMMUNITY): Payer: Self-pay

## 2021-08-15 ENCOUNTER — Emergency Department (HOSPITAL_COMMUNITY)
Admission: EM | Admit: 2021-08-15 | Discharge: 2021-08-15 | Discharge status: Against Medical Advice | Payer: Medicaid Other | Attending: Emergency Medicine | Admitting: Emergency Medicine

## 2021-08-15 ENCOUNTER — Other Ambulatory Visit: Payer: Self-pay

## 2021-08-15 ENCOUNTER — Emergency Department (HOSPITAL_COMMUNITY): Payer: Medicaid Other

## 2021-08-15 DIAGNOSIS — R7989 Other specified abnormal findings of blood chemistry: Secondary | ICD-10-CM | POA: Insufficient documentation

## 2021-08-15 DIAGNOSIS — F10920 Alcohol use, unspecified with intoxication, uncomplicated: Secondary | ICD-10-CM

## 2021-08-15 DIAGNOSIS — R531 Weakness: Secondary | ICD-10-CM | POA: Insufficient documentation

## 2021-08-15 DIAGNOSIS — Y908 Blood alcohol level of 240 mg/100 ml or more: Secondary | ICD-10-CM | POA: Insufficient documentation

## 2021-08-15 DIAGNOSIS — F10129 Alcohol abuse with intoxication, unspecified: Secondary | ICD-10-CM | POA: Insufficient documentation

## 2021-08-15 DIAGNOSIS — M542 Cervicalgia: Secondary | ICD-10-CM | POA: Insufficient documentation

## 2021-08-15 DIAGNOSIS — Z79899 Other long term (current) drug therapy: Secondary | ICD-10-CM | POA: Insufficient documentation

## 2021-08-15 LAB — COMPREHENSIVE METABOLIC PANEL
ALT: 43 U/L (ref 0–44)
AST: 89 U/L — ABNORMAL HIGH (ref 15–41)
Albumin: 4.5 g/dL (ref 3.5–5.0)
Alkaline Phosphatase: 60 U/L (ref 38–126)
Anion gap: 13 (ref 5–15)
BUN: 9 mg/dL (ref 6–20)
CO2: 23 mmol/L (ref 22–32)
Calcium: 8.8 mg/dL — ABNORMAL LOW (ref 8.9–10.3)
Chloride: 107 mmol/L (ref 98–111)
Creatinine, Ser: 0.55 mg/dL (ref 0.44–1.00)
GFR, Estimated: >60 mL/min (ref >60)
Glucose, Bld: 109 mg/dL — ABNORMAL HIGH (ref 70–99)
Potassium: 3.9 mmol/L (ref 3.5–5.1)
Sodium: 143 mmol/L (ref 135–145)
Total Bilirubin: 0.1 mg/dL — ABNORMAL LOW (ref 0.3–1.2)
Total Protein: 7.3 g/dL (ref 6.5–8.1)

## 2021-08-15 LAB — CBC WITH DIFFERENTIAL/PLATELET
Abs Immature Granulocytes: 0.02 10*3/uL (ref 0.00–0.07)
Basophils Absolute: 0.1 10*3/uL (ref 0.0–0.1)
Basophils Relative: 1 %
Eosinophils Absolute: 0 10*3/uL (ref 0.0–0.5)
Eosinophils Relative: 0 %
HCT: 44.3 % (ref 36.0–46.0)
Hemoglobin: 15 g/dL (ref 12.0–15.0)
Immature Granulocytes: 0 %
Lymphocytes Relative: 45 %
Lymphs Abs: 3.5 10*3/uL (ref 0.7–4.0)
MCH: 32.1 pg (ref 26.0–34.0)
MCHC: 33.9 g/dL (ref 30.0–36.0)
MCV: 94.9 fL (ref 80.0–100.0)
Monocytes Absolute: 0.6 10*3/uL (ref 0.1–1.0)
Monocytes Relative: 8 %
Neutro Abs: 3.6 10*3/uL (ref 1.7–7.7)
Neutrophils Relative %: 46 %
Platelets: 203 10*3/uL (ref 150–400)
RBC: 4.67 MIL/uL (ref 3.87–5.11)
RDW: 14.3 % (ref 11.5–15.5)
WBC: 7.8 10*3/uL (ref 4.0–10.5)
nRBC: 0 % (ref 0.0–0.2)

## 2021-08-15 LAB — LACTIC ACID, PLASMA: Lactic Acid, Venous: 3.7 mmol/L (ref 0.5–1.9)

## 2021-08-15 LAB — ETHANOL: Alcohol, Ethyl (B): 482 mg/dL (ref <10)

## 2021-08-15 MED ORDER — LACTATED RINGERS IV BOLUS: 1000.0000 mL | Freq: Once | INTRAVENOUS | Status: DC

## 2021-08-15 MED ORDER — LORAZEPAM 1 MG PO TABS
1.0000 mg | ORAL_TABLET | Freq: Once | ORAL | Status: AC
  Administered 2021-08-15: 1 mg via ORAL
  Filled 2021-08-15: qty 1

## 2021-08-15 MED ORDER — LACTATED RINGERS IV BOLUS
1000.0000 mL | Freq: Once | INTRAVENOUS | Status: AC
  Administered 2021-08-15: 1000 mL via INTRAVENOUS
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
## 2021-08-15 NOTE — ED Triage Notes (Signed)
Pt arrived from home via [facility] w c/o having a sz at home due to alcohol withdrawal. Pt states that it has been 6 hours since her last drink

## 2021-08-17 ENCOUNTER — Other Ambulatory Visit: Payer: Self-pay

## 2021-08-17 ENCOUNTER — Inpatient Hospital Stay (HOSPITAL_COMMUNITY)
Admission: EM | Admit: 2021-08-17 | Discharge: 2021-08-18 | DRG: 894 | Discharge status: Against Medical Advice | Payer: Self-pay | Attending: Family Medicine | Admitting: Family Medicine

## 2021-08-17 ENCOUNTER — Observation Stay (HOSPITAL_COMMUNITY): Payer: Self-pay

## 2021-08-17 ENCOUNTER — Encounter (HOSPITAL_COMMUNITY): Payer: Self-pay | Admitting: Emergency Medicine

## 2021-08-17 DIAGNOSIS — F1910 Other psychoactive substance abuse, uncomplicated: Secondary | ICD-10-CM | POA: Diagnosis present

## 2021-08-17 DIAGNOSIS — Z87442 Personal history of urinary calculi: Secondary | ICD-10-CM

## 2021-08-17 DIAGNOSIS — E871 Hypo-osmolality and hyponatremia: Secondary | ICD-10-CM | POA: Diagnosis present

## 2021-08-17 DIAGNOSIS — E8729 Other acidosis: Secondary | ICD-10-CM | POA: Diagnosis present

## 2021-08-17 DIAGNOSIS — R21 Rash and other nonspecific skin eruption: Secondary | ICD-10-CM | POA: Diagnosis present

## 2021-08-17 DIAGNOSIS — Y900 Blood alcohol level of less than 20 mg/100 ml: Secondary | ICD-10-CM | POA: Diagnosis present

## 2021-08-17 DIAGNOSIS — Z8711 Personal history of peptic ulcer disease: Secondary | ICD-10-CM

## 2021-08-17 DIAGNOSIS — Z8249 Family history of ischemic heart disease and other diseases of the circulatory system: Secondary | ICD-10-CM

## 2021-08-17 DIAGNOSIS — B1920 Unspecified viral hepatitis C without hepatic coma: Secondary | ICD-10-CM | POA: Diagnosis present

## 2021-08-17 DIAGNOSIS — Z886 Allergy status to analgesic agent status: Secondary | ICD-10-CM

## 2021-08-17 DIAGNOSIS — Z823 Family history of stroke: Secondary | ICD-10-CM

## 2021-08-17 DIAGNOSIS — R112 Nausea with vomiting, unspecified: Secondary | ICD-10-CM | POA: Diagnosis present

## 2021-08-17 DIAGNOSIS — Z885 Allergy status to narcotic agent status: Secondary | ICD-10-CM

## 2021-08-17 DIAGNOSIS — F17210 Nicotine dependence, cigarettes, uncomplicated: Secondary | ICD-10-CM | POA: Diagnosis present

## 2021-08-17 DIAGNOSIS — F10939 Alcohol use, unspecified with withdrawal, unspecified: Secondary | ICD-10-CM | POA: Diagnosis present

## 2021-08-17 DIAGNOSIS — Z20822 Contact with and (suspected) exposure to covid-19: Secondary | ICD-10-CM | POA: Diagnosis present

## 2021-08-17 DIAGNOSIS — R443 Hallucinations, unspecified: Secondary | ICD-10-CM | POA: Diagnosis present

## 2021-08-17 DIAGNOSIS — R748 Abnormal levels of other serum enzymes: Secondary | ICD-10-CM

## 2021-08-17 DIAGNOSIS — R252 Cramp and spasm: Secondary | ICD-10-CM | POA: Diagnosis present

## 2021-08-17 DIAGNOSIS — R7401 Elevation of levels of liver transaminase levels: Secondary | ICD-10-CM

## 2021-08-17 DIAGNOSIS — F1210 Cannabis abuse, uncomplicated: Secondary | ICD-10-CM | POA: Diagnosis present

## 2021-08-17 DIAGNOSIS — R109 Unspecified abdominal pain: Secondary | ICD-10-CM

## 2021-08-17 DIAGNOSIS — F10232 Alcohol dependence with withdrawal with perceptual disturbance: Principal | ICD-10-CM | POA: Diagnosis present

## 2021-08-17 DIAGNOSIS — R7989 Other specified abnormal findings of blood chemistry: Secondary | ICD-10-CM

## 2021-08-17 DIAGNOSIS — R030 Elevated blood-pressure reading, without diagnosis of hypertension: Secondary | ICD-10-CM | POA: Diagnosis present

## 2021-08-17 DIAGNOSIS — Z811 Family history of alcohol abuse and dependence: Secondary | ICD-10-CM

## 2021-08-17 DIAGNOSIS — Z5329 Procedure and treatment not carried out because of patient's decision for other reasons: Secondary | ICD-10-CM | POA: Diagnosis present

## 2021-08-17 DIAGNOSIS — Z8041 Family history of malignant neoplasm of ovary: Secondary | ICD-10-CM

## 2021-08-17 DIAGNOSIS — F10930 Alcohol use, unspecified with withdrawal, uncomplicated: Secondary | ICD-10-CM

## 2021-08-17 DIAGNOSIS — Z79899 Other long term (current) drug therapy: Secondary | ICD-10-CM

## 2021-08-17 LAB — URINALYSIS, ROUTINE W REFLEX MICROSCOPIC
Bilirubin Urine: NEGATIVE
Glucose, UA: NEGATIVE mg/dL
Ketones, ur: 80 mg/dL — AB
Leukocytes,Ua: NEGATIVE
Nitrite: NEGATIVE
Protein, ur: 100 mg/dL — AB
Specific Gravity, Urine: 1.018 (ref 1.005–1.030)
pH: 6 (ref 5.0–8.0)

## 2021-08-17 LAB — CBC WITH DIFFERENTIAL/PLATELET
Abs Immature Granulocytes: 0.01 10*3/uL (ref 0.00–0.07)
Basophils Absolute: 0 10*3/uL (ref 0.0–0.1)
Basophils Relative: 1 %
Eosinophils Absolute: 0 10*3/uL (ref 0.0–0.5)
Eosinophils Relative: 0 %
HCT: 42.9 % (ref 36.0–46.0)
Hemoglobin: 15 g/dL (ref 12.0–15.0)
Immature Granulocytes: 0 %
Lymphocytes Relative: 17 %
Lymphs Abs: 1.4 10*3/uL (ref 0.7–4.0)
MCH: 33.2 pg (ref 26.0–34.0)
MCHC: 35 g/dL (ref 30.0–36.0)
MCV: 94.9 fL (ref 80.0–100.0)
Monocytes Absolute: 0.4 10*3/uL (ref 0.1–1.0)
Monocytes Relative: 5 %
Neutro Abs: 6.4 10*3/uL (ref 1.7–7.7)
Neutrophils Relative %: 77 %
Platelets: 163 10*3/uL (ref 150–400)
RBC: 4.52 MIL/uL (ref 3.87–5.11)
RDW: 13.9 % (ref 11.5–15.5)
WBC: 8.3 10*3/uL (ref 4.0–10.5)
nRBC: 0 % (ref 0.0–0.2)

## 2021-08-17 LAB — COMPREHENSIVE METABOLIC PANEL
ALT: 40 U/L (ref 0–44)
AST: 66 U/L — ABNORMAL HIGH (ref 15–41)
Albumin: 4.5 g/dL (ref 3.5–5.0)
Alkaline Phosphatase: 61 U/L (ref 38–126)
Anion gap: 21 — ABNORMAL HIGH (ref 5–15)
BUN: 10 mg/dL (ref 6–20)
CO2: 17 mmol/L — ABNORMAL LOW (ref 22–32)
Calcium: 8.4 mg/dL — ABNORMAL LOW (ref 8.9–10.3)
Chloride: 95 mmol/L — ABNORMAL LOW (ref 98–111)
Creatinine, Ser: 0.57 mg/dL (ref 0.44–1.00)
GFR, Estimated: >60 mL/min (ref >60)
Glucose, Bld: 121 mg/dL — ABNORMAL HIGH (ref 70–99)
Potassium: 3.6 mmol/L (ref 3.5–5.1)
Sodium: 133 mmol/L — ABNORMAL LOW (ref 135–145)
Total Bilirubin: 2.1 mg/dL — ABNORMAL HIGH (ref 0.3–1.2)
Total Protein: 7.7 g/dL (ref 6.5–8.1)

## 2021-08-17 LAB — RAPID URINE DRUG SCREEN, HOSP PERFORMED
Amphetamines: NOT DETECTED
Barbiturates: NOT DETECTED
Benzodiazepines: NOT DETECTED
Cocaine: NOT DETECTED
Opiates: NOT DETECTED
Tetrahydrocannabinol: POSITIVE — AB

## 2021-08-17 LAB — BLOOD GAS, VENOUS
Acid-Base Excess: 1.5 mmol/L (ref 0.0–2.0)
Bicarbonate: 25.6 mmol/L (ref 20.0–28.0)
Drawn by: 6509
FIO2: 21
O2 Saturation: 94.9 %
Patient temperature: 36.6
pCO2, Ven: 39.6 mmHg — ABNORMAL LOW (ref 44.0–60.0)
pH, Ven: 7.423 (ref 7.250–7.430)
pO2, Ven: 76.1 mmHg — ABNORMAL HIGH (ref 32.0–45.0)

## 2021-08-17 LAB — LIPASE, BLOOD: Lipase: 64 U/L — ABNORMAL HIGH (ref 11–51)

## 2021-08-17 LAB — MRSA NEXT GEN BY PCR, NASAL: MRSA by PCR Next Gen: NOT DETECTED

## 2021-08-17 LAB — RESP PANEL BY RT-PCR (FLU A&B, COVID) ARPGX2
Influenza A by PCR: NEGATIVE
Influenza B by PCR: NEGATIVE
SARS Coronavirus 2 by RT PCR: NEGATIVE

## 2021-08-17 LAB — CBG MONITORING, ED: Glucose-Capillary: 128 mg/dL — ABNORMAL HIGH (ref 70–99)

## 2021-08-17 LAB — PREGNANCY, URINE: Preg Test, Ur: NEGATIVE

## 2021-08-17 LAB — HIV ANTIBODY (ROUTINE TESTING W REFLEX): HIV Screen 4th Generation wRfx: NONREACTIVE

## 2021-08-17 LAB — LACTIC ACID, PLASMA
Lactic Acid, Venous: 2.2 mmol/L (ref 0.5–1.9)
Lactic Acid, Venous: 0.8 mmol/L (ref 0.5–1.9)

## 2021-08-17 LAB — ETHANOL: Alcohol, Ethyl (B): <10 mg/dL (ref <10)

## 2021-08-17 MED ORDER — ACETAMINOPHEN 650 MG RE SUPP: 650.0000 mg | Freq: Four times a day (QID) | RECTAL | Status: DC | PRN

## 2021-08-17 MED ORDER — DIPHENHYDRAMINE HCL 50 MG/ML IJ SOLN
25.0000 mg | Freq: Once | INTRAMUSCULAR | Status: AC
Start: 2021-08-17 — End: 2021-08-17
  Administered 2021-08-17: 02:00:00 25 mg via INTRAVENOUS
  Filled 2021-08-17: qty 1

## 2021-08-17 MED ORDER — ONDANSETRON HCL 4 MG PO TABS
4.0000 mg | ORAL_TABLET | Freq: Four times a day (QID) | ORAL | Status: DC | PRN
  Administered 2021-08-17: 18:00:00 4 mg via ORAL
  Filled 2021-08-17: qty 1

## 2021-08-17 MED ORDER — LACTATED RINGERS IV BOLUS
1000.0000 mL | Freq: Once | INTRAVENOUS | Status: AC
  Administered 2021-08-17: 02:00:00 1000 mL via INTRAVENOUS

## 2021-08-17 MED ORDER — MORPHINE SULFATE (PF) 2 MG/ML IV SOLN
2.0000 mg | INTRAVENOUS | Status: DC | PRN
  Administered 2021-08-17 – 2021-08-18 (×8): 2 mg via INTRAVENOUS
  Filled 2021-08-17 (×8): qty 1

## 2021-08-17 MED ORDER — ACETAMINOPHEN 325 MG PO TABS: 650.0000 mg | ORAL_TABLET | Freq: Four times a day (QID) | ORAL | Status: DC | PRN

## 2021-08-17 MED ORDER — THIAMINE HCL 100 MG PO TABS
100.0000 mg | ORAL_TABLET | Freq: Every day | ORAL | Status: DC
  Administered 2021-08-17 – 2021-08-18 (×2): 100 mg via ORAL
  Filled 2021-08-17 (×2): qty 1

## 2021-08-17 MED ORDER — DIPHENHYDRAMINE HCL 50 MG/ML IJ SOLN: 25.0000 mg | Freq: Two times a day (BID) | INTRAMUSCULAR | Status: DC | PRN

## 2021-08-17 MED ORDER — LORAZEPAM 1 MG PO TABS
0.0000 mg | ORAL_TABLET | Freq: Four times a day (QID) | ORAL | Status: DC
  Administered 2021-08-17 – 2021-08-18 (×2): 1 mg via ORAL
  Filled 2021-08-17 (×2): qty 1

## 2021-08-17 MED ORDER — MORPHINE SULFATE (PF) 4 MG/ML IV SOLN
4.0000 mg | Freq: Once | INTRAVENOUS | Status: AC
  Administered 2021-08-17: 02:00:00 4 mg via INTRAVENOUS
  Filled 2021-08-17: qty 1

## 2021-08-17 MED ORDER — LORAZEPAM 1 MG PO TABS: 0.0000 mg | ORAL_TABLET | Freq: Two times a day (BID) | ORAL | Status: DC

## 2021-08-17 MED ORDER — LORAZEPAM 2 MG/ML IJ SOLN
0.0000 mg | Freq: Two times a day (BID) | INTRAMUSCULAR | Status: DC
  Administered 2021-08-18: 09:00:00 1 mg via INTRAVENOUS

## 2021-08-17 MED ORDER — FOLIC ACID 1 MG PO TABS
1.0000 mg | ORAL_TABLET | Freq: Every day | ORAL | Status: DC
  Administered 2021-08-17 – 2021-08-18 (×2): 1 mg via ORAL
  Filled 2021-08-17 (×2): qty 1

## 2021-08-17 MED ORDER — LACTATED RINGERS IV BOLUS
1000.0000 mL | Freq: Once | INTRAVENOUS | Status: AC
  Administered 2021-08-17: 03:00:00 1000 mL via INTRAVENOUS

## 2021-08-17 MED ORDER — AMITRIPTYLINE HCL 25 MG PO TABS
50.0000 mg | ORAL_TABLET | Freq: Every day | ORAL | Status: DC
  Administered 2021-08-17: 22:00:00 50 mg via ORAL
  Filled 2021-08-17: qty 2

## 2021-08-17 MED ORDER — THIAMINE HCL 100 MG/ML IJ SOLN: 100.0000 mg | Freq: Every day | INTRAMUSCULAR | Status: DC

## 2021-08-17 MED ORDER — NICOTINE 14 MG/24HR TD PT24
14.0000 mg | MEDICATED_PATCH | Freq: Every day | TRANSDERMAL | Status: DC
  Administered 2021-08-17 – 2021-08-18 (×2): 14 mg via TRANSDERMAL
  Filled 2021-08-17 (×2): qty 1

## 2021-08-17 MED ORDER — FENTANYL CITRATE PF 50 MCG/ML IJ SOSY
50.0000 ug | PREFILLED_SYRINGE | Freq: Once | INTRAMUSCULAR | Status: AC
  Administered 2021-08-17: 05:00:00 50 ug via INTRAVENOUS
  Filled 2021-08-17: qty 1

## 2021-08-17 MED ORDER — LORAZEPAM 2 MG/ML IJ SOLN
1.0000 mg | Freq: Once | INTRAMUSCULAR | Status: AC
Start: 2021-08-17 — End: 2021-08-17
  Administered 2021-08-17: 02:00:00 1 mg via INTRAVENOUS
  Filled 2021-08-17: qty 1

## 2021-08-17 MED ORDER — OXYCODONE HCL 5 MG PO TABS
5.0000 mg | ORAL_TABLET | ORAL | Status: DC | PRN
  Administered 2021-08-17 – 2021-08-18 (×6): 5 mg via ORAL
  Filled 2021-08-17 (×6): qty 1

## 2021-08-17 MED ORDER — PANTOPRAZOLE SODIUM 40 MG PO TBEC
40.0000 mg | DELAYED_RELEASE_TABLET | Freq: Every day | ORAL | Status: DC
Start: 2021-08-17 — End: 2021-08-18
  Administered 2021-08-17 – 2021-08-18 (×2): 40 mg via ORAL
  Filled 2021-08-17 (×2): qty 1

## 2021-08-17 MED ORDER — LORAZEPAM 2 MG/ML IJ SOLN
0.0000 mg | Freq: Four times a day (QID) | INTRAMUSCULAR | Status: DC
  Administered 2021-08-17 – 2021-08-18 (×5): 2 mg via INTRAVENOUS
  Filled 2021-08-17 (×6): qty 1

## 2021-08-17 MED ORDER — ONDANSETRON HCL 4 MG/2ML IJ SOLN
4.0000 mg | Freq: Once | INTRAMUSCULAR | Status: AC
  Administered 2021-08-17: 02:00:00 4 mg via INTRAVENOUS
  Filled 2021-08-17: qty 2

## 2021-08-17 MED ORDER — ONDANSETRON HCL 4 MG/2ML IJ SOLN: 4.0000 mg | Freq: Four times a day (QID) | INTRAMUSCULAR | Status: DC | PRN

## 2021-08-17 MED ORDER — HEPARIN SODIUM (PORCINE) 5000 UNIT/ML IJ SOLN
5000.0000 [IU] | Freq: Three times a day (TID) | INTRAMUSCULAR | Status: DC
  Administered 2021-08-17 – 2021-08-18 (×5): 5000 [IU] via SUBCUTANEOUS
  Filled 2021-08-17 (×5): qty 1

## 2021-08-17 NOTE — Progress Notes (Signed)
°  Transition of Care [facility]) Screening Note   Patient Details  Name: Olivia Lester Date of Birth: Aug 20, 1975   Transition of Care [facility]) CM/SW Contact:    Iona Beard, [place] Phone Number: 08/17/2021, 1:32 PM  TOC noted that per chart review pt was provided with substance use resources on 9/9.   Transition of Care Department [facility]) has reviewed patient and no TOC needs have been identified at this time. We will continue to monitor patient advancement through interdisciplinary progression rounds. If new patient transition needs arise, please place a TOC consult.

## 2021-08-17 NOTE — Progress Notes (Signed)
Patient transferring to dept 300 telemetry room# 303, report given to receiving nurse Joellen Jersey

## 2021-08-17 NOTE — ED Triage Notes (Signed)
Pt c/o abd pain with n/v and alcohol withdrawal. Pt states last etoh was Sunday night at 1800.

## 2021-08-17 NOTE — H&P (Signed)
TRH H&P    Patient Demographics:    Khyra Viscuso, is a 46 y.o. female  MRN: [id number]  DOB - [birth year]  Admit Date - 08/17/2021  Referring MD/NP/PA: Roxanne Mins  Outpatient Primary MD for the patient is Patient, No Pcp Per (Inactive)  Patient coming from: Home  Chief complaint-alcohol withdrawal   HPI:    Maja Haber  is a 46 y.o. 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  Patient reports that she has had diarrhea and been throwing up for 18 hours straight.  She reports that she has vomited so much that she has been lightheaded.  She has had muscle cramps that make her feel like she is going to have a seizure.  She reports she has had alcohol withdrawal seizures in the past.  She has associated abdominal pain that is diffuse but worst in the lower abdomen.  Feels like menstrual cramps, but patient is no longer has menstrual cycle.  Patient has associated dysuria as well.  Patient reports she is throwing up every 15-20 minutes.  Her emesis now appears as green liquid with no blood.  She reports a degree because she has been drinking green Gatorade.  Her last normal meal was on the 20th.  Patient reports that she normally does not eat on the weekends because she drinks all day.  She drinks about 1/5 of liquor per day.  Her last drink was Sunday morning.  Patient reports her last bowel movement was in the middle of last week.  She reports she only goes about once a week and has to take a laxative to make that happen.  Patient has taken Tylenol for her abdominal pain with no relief.  She reports her abdominal pain is constant.  She has not had a fever.  She does report a rash on her lower abdomen that was itchy and resolved with Benadryl.  She has had headaches as  well, but reports that that is relatively normal for her.  Patient has no other complaints at this time.  Patient is a current smoker a pack a day.  She does drink alcohol.  She does use marijuana.  Her last marijuana use was 2 days ago.  She is vaccinated for COVID.  Patient is full code.  In the ED Temp 97.9, heart rate 71 -100, respiratory rate 15-19, blood pressure 155/92, satting at 98% No leukocytosis, hemoglobin 15.0 Chemistry panel reveals a slight hyponatremia 133, decreased bicarb at 17, increased gap at 21 Lipase is 64, AST is 66, ALT is 40, T bili is 2.1 Lactic acid is initially 3.7 and then 2.2 EKG shows a heart rate 91, sinus rhythm, QTC 457 Patient was given Benadryl, fentanyl, 2 L LR, started on the CIWA protocol, given morphine and Zofran ED provider reports that at presentation patient was very shaky but has done better since CIWA Admission was requested for alcohol withdrawal and alcoholic ketoacidosis  Review of systems:    In addition to the HPI above,  No Fever-chills, Admits to headache no changes with Vision or hearing, No problems swallowing food or Liquids, No Chest pain, Cough or Shortness of Breath, Admits to abdominal pain, nausea, vomiting, constipation, loose stools  no Blood in stool or Urine, Admits to dysuria Admits to pruritic rash on lower abdomen that resolved with Benadryl No new joints pains-aches,  No new weakness, tingling, numbness in any extremity, No recent weight gain or loss, No polyuria, polydypsia or polyphagia, No significant Mental Stressors.  All other systems reviewed and are negative.    Past History of the following :    Past Medical History:  Diagnosis Date   Alcohol abuse    Back injury    Gastric ulcer    Gastric ulcer 10/14/2013   Hepatitis C 10/14/2013   History of kidney stones    Nonunion of foot fracture    right 5th metatarsal   Pancreatitis, acute    Panic attack    Pneumonia    Polysubstance abuse  (HCC)    PONV (postoperative nausea and vomiting)    Seizures ([place])       Past Surgical History:  Procedure Laterality Date   BACK SURGERY     BREAST SURGERY Left    lumpectomy   ESOPHAGOGASTRODUODENOSCOPY (EGD) WITH PROPOFOL N/A 10/14/2013   Procedure: ESOPHAGOGASTRODUODENOSCOPY (EGD) WITH PROPOFOL;  Surgeon: Rogene Houston, MD;  Location: [facility];  Service: Endoscopy;  Laterality: N/A;   ORIF TOE FRACTURE Right 08/08/2018   Procedure: RIGHT FOOT INTERNAL FIXATION 5TH METATARSAL;  Surgeon: Newt Minion, MD;  Location: [facility];  Service: Orthopedics;  Laterality: Right;   TONSILLECTOMY        Social History:      Social History   Tobacco Use   Smoking status: Every Day    Packs/day: 1.00    Years: 20.00    Pack years: 20.00    Types: Cigarettes   Smokeless tobacco: Never  Substance Use Topics   Alcohol use: Yes    Alcohol/week: 18.0 standard drinks    Types: 18 Cans of beer per week    Comment: 1/5 -1/2 gallon of liqour daily or 18 beers daily       Family History :     Family History  Problem Relation Age of Onset   Cancer Mother 27       ovarian    Hypertension Mother    Stroke Mother    Early death Father    Alcohol abuse Father       Home Medications:   Prior to Admission medications   Medication Sig Start Date End Date Taking? Authorizing Provider  amitriptyline (ELAVIL) 50 MG tablet Take 50 mg by mouth at bedtime. 03/24/21   [provider]  amLODipine (NORVASC) 5 MG tablet Take 1 tablet (5 mg total) by mouth daily. Patient not taking: Reported on 10/01/2020 10/28/19   Roxan Hockey, MD  chlordiazePOXIDE (LIBRIUM) 25 MG capsule 50mg  PO TID x 1D, then 25-50mg  PO BID X 1D, then 25-50mg  PO QD X 1D 40/98/11   Delora Fuel, MD  folic acid (FOLVITE) 1 MG tablet Take 1 tablet (1 mg total) by mouth daily. 10/29/19   Roxan Hockey, MD  lidocaine (LIDODERM) 5 % Place 1 patch onto the skin daily. Remove & Discard patch within 12 hours or as directed  by MD 07/12/20   Heath Lark D, DO  lipase/protease/amylase 24000-76000 units CPEP Take  1 capsule (24,000 Units total) by mouth 3 (three) times daily before meals. Patient not taking: No sig reported 10/29/19   Roxan Hockey, MD  magnesium 30 MG tablet Take 1 tablet (30 mg total) by mouth 2 (two) times daily. 40/98/11   Delora Fuel, MD  methocarbamol (ROBAXIN) 500 MG tablet Take 1 tablet (500 mg total) by mouth every 8 (eight) hours as needed for muscle spasms. 07/11/20   Manuella Ghazi, Pratik D, DO  Multiple Vitamin (MULTIVITAMIN WITH MINERALS) TABS tablet Take 1 tablet by mouth every other day.  Patient not taking: No sig reported    [provider]  pantoprazole (PROTONIX) 40 MG tablet Take 1 tablet (40 mg total) by mouth daily. 06/03/20 09/01/20  Harvest Dark, MD  potassium chloride SA (KLOR-CON) 20 MEQ tablet Take 1 tablet (20 mEq total) by mouth 2 (two) times daily. 91/47/82   Delora Fuel, MD  promethazine (PHENERGAN) 25 MG tablet Take 1 tablet (25 mg total) by mouth every 6 (six) hours as needed for nausea or vomiting. 08/10/20   Veryl Speak, MD     Allergies:     Allergies  Allergen Reactions   Toradol [Ketorolac Tromethamine] Other (See Comments)    Patient advised not to take due to ulcers   Acetaminophen    Asa [Aspirin] Rash     Physical Exam:   Vitals  Blood pressure (!) 155/92, pulse 78, temperature 97.9 F (36.6 C), resp. rate 17, height 5\' 7"  (1.702 m), weight 45.3 kg, SpO2 98 %.   1.  General: Patient lying supine in bed,  no acute distress   2. Psychiatric: Alert and oriented x 3, mood is irritable and behavior normal for situation, cooperative with exam   3. Neurologic: Speech and language are normal, face is symmetric, moves all 4 extremities voluntarily, at baseline without acute deficits on limited exam   4. HEENMT:  Head is atraumatic, normocephalic, pupils reactive to light, neck is supple, trachea is midline, mucous membranes are moist, poor  dentition   5. Respiratory : Lungs are clear to auscultation bilaterally without wheezing, rhonchi, rales, no cyanosis, no increase in work of breathing or accessory muscle use   6. Cardiovascular : Heart rate slightly tachycardic, rhythm is regular, no murmurs, rubs or gallops, no peripheral edema, peripheral pulses palpated   7. Gastrointestinal:  Abdomen is soft, nondistended, diffusely tender and worse at the epigastric region, bowel sounds active, no masses or organomegaly palpated   8. Skin:  Skin is warm, dry and intact without rashes, acute lesions, or ulcers on limited exam   9.Musculoskeletal:  No acute deformities or trauma, no asymmetry in tone, no peripheral edema, peripheral pulses palpated, no tenderness to palpation in the extremities     Data Review:    CBC Recent Labs  Lab 08/15/21 0315 08/17/21 0045  WBC 7.8 8.3  HGB 15.0 15.0  HCT 44.3 42.9  PLT 203 163  MCV 94.9 94.9  MCH 32.1 33.2  MCHC 33.9 35.0  RDW 14.3 13.9  LYMPHSABS 3.5 1.4  MONOABS 0.6 0.4  EOSABS 0.0 0.0  BASOSABS 0.1 0.0   ------------------------------------------------------------------------------------------------------------------  Results for orders placed or performed during the hospital encounter of 08/17/21 (from the past 48 hour(s))  CBG monitoring, ED     Status: Abnormal   Collection Time: 08/17/21 12:45 AM  Result Value Ref Range   Glucose-Capillary 128 (H) 70 - 99 mg/dL    Comment: Glucose reference range applies only to samples taken after fasting for at  least 8 hours.  Comprehensive metabolic panel     Status: Abnormal   Collection Time: 08/17/21 12:45 AM  Result Value Ref Range   Sodium 133 (L) 135 - 145 mmol/L    Comment: DELTA CHECK NOTED   Potassium 3.6 3.5 - 5.1 mmol/L   Chloride 95 (L) 98 - 111 mmol/L   CO2 17 (L) 22 - 32 mmol/L   Glucose, Bld 121 (H) 70 - 99 mg/dL    Comment: Glucose reference range applies only to samples taken after fasting for at least 8  hours.   BUN 10 6 - 20 mg/dL   Creatinine, Ser 0.57 0.44 - 1.00 mg/dL   Calcium 8.4 (L) 8.9 - 10.3 mg/dL   Total Protein 7.7 6.5 - 8.1 g/dL   Albumin 4.5 3.5 - 5.0 g/dL   AST 66 (H) 15 - 41 U/L   ALT 40 0 - 44 U/L   Alkaline Phosphatase 61 38 - 126 U/L   Total Bilirubin 2.1 (H) 0.3 - 1.2 mg/dL   GFR, Estimated >60 >60 mL/min    Comment: (NOTE) Calculated using the CKD-EPI Creatinine Equation (2021)    Anion gap 21 (H) 5 - 15    Comment: Performed at [facility], [address] [place], [place] [address]  Ethanol     Status: None   Collection Time: 08/17/21 12:45 AM  Result Value Ref Range   Alcohol, Ethyl (B) <10 <10 mg/dL    Comment: (NOTE) Lowest detectable limit for serum alcohol is 10 mg/dL.  For medical purposes only. Performed at [facility], [address] [place], [place] [address]   CBC with Differential     Status: None   Collection Time: 08/17/21 12:45 AM  Result Value Ref Range   WBC 8.3 4.0 - 10.5 K/uL   RBC 4.52 3.87 - 5.11 MIL/uL   Hemoglobin 15.0 12.0 - 15.0 g/dL   HCT 42.9 36.0 - 46.0 %   MCV 94.9 80.0 - 100.0 fL   MCH 33.2 26.0 - 34.0 pg   MCHC 35.0 30.0 - 36.0 g/dL   RDW 13.9 11.5 - 15.5 %   Platelets 163 150 - 400 K/uL   nRBC 0.0 0.0 - 0.2 %   Neutrophils Relative % 77 %   Neutro Abs 6.4 1.7 - 7.7 K/uL   Lymphocytes Relative 17 %   Lymphs Abs 1.4 0.7 - 4.0 K/uL   Monocytes Relative 5 %   Monocytes Absolute 0.4 0.1 - 1.0 K/uL   Eosinophils Relative 0 %   Eosinophils Absolute 0.0 0.0 - 0.5 K/uL   Basophils Relative 1 %   Basophils Absolute 0.0 0.0 - 0.1 K/uL   Immature Granulocytes 0 %   Abs Immature Granulocytes 0.01 0.00 - 0.07 K/uL    Comment: Performed at [facility], [address], [place], [place] [address]  Lipase, blood     Status: Abnormal   Collection Time: 08/17/21 12:45 AM  Result Value Ref Range   Lipase 64 (H) 11 - 51 U/L    Comment: Performed at [facility], [address] [place],  [address]  Urinalysis, Routine w  reflex microscopic Urine, Clean Catch     Status: Abnormal   Collection Time: 08/17/21  1:35 AM  Result Value Ref Range   Color, Urine YELLOW YELLOW   APPearance CLEAR CLEAR   Specific Gravity, Urine 1.018 1.005 - 1.030   pH 6.0 5.0 - 8.0   Glucose, UA NEGATIVE NEGATIVE mg/dL   Hgb urine  dipstick MODERATE (A) NEGATIVE   Bilirubin Urine NEGATIVE NEGATIVE   Ketones, ur 80 (A) NEGATIVE mg/dL   Protein, ur 100 (A) NEGATIVE mg/dL   Nitrite NEGATIVE NEGATIVE   Leukocytes,Ua NEGATIVE NEGATIVE   RBC / HPF 0-5 0 - 5 RBC/hpf   WBC, UA 0-5 0 - 5 WBC/hpf   Bacteria, UA RARE (A) NONE SEEN   Squamous Epithelial / LPF 0-5 0 - 5   Mucus PRESENT     Comment: Performed at [facility], [address], [place], [place] [address]  Urine rapid drug screen (hosp performed)     Status: Abnormal   Collection Time: 08/17/21  1:35 AM  Result Value Ref Range   Opiates NONE DETECTED NONE DETECTED   Cocaine NONE DETECTED NONE DETECTED   Benzodiazepines NONE DETECTED NONE DETECTED   Amphetamines NONE DETECTED NONE DETECTED   Tetrahydrocannabinol POSITIVE (A) NONE DETECTED   Barbiturates NONE DETECTED NONE DETECTED    Comment: (NOTE) DRUG SCREEN FOR MEDICAL PURPOSES ONLY.  IF CONFIRMATION IS NEEDED FOR ANY PURPOSE, NOTIFY LAB WITHIN 5 DAYS.  LOWEST DETECTABLE LIMITS FOR URINE DRUG SCREEN Drug Class                     Cutoff (ng/mL) Amphetamine and metabolites    1000 Barbiturate and metabolites    200 Benzodiazepine                 427 Tricyclics and metabolites     300 Opiates and metabolites        300 Cocaine and metabolites        300 THC                            50 Performed at [facility], [address] [place], [place] [address]   Lactic acid, plasma     Status: Abnormal   Collection Time: 08/17/21  1:45 AM  Result Value Ref Range   Lactic Acid, Venous 2.2 (HH) 0.5 - 1.9 mmol/L    Comment: CRITICAL RESULT CALLED TO, READ BACK BY AND VERIFIED WITH: Pruitt,G@0215  by [email or web address], b  1.24.23 Performed at [facility], [address], [place], [place] [address]   Pregnancy, urine     Status: None   Collection Time: 08/17/21  1:52 AM  Result Value Ref Range   Preg Test, Ur NEGATIVE NEGATIVE    Comment:        THE SENSITIVITY OF THIS METHODOLOGY IS >20 mIU/mL. Performed at [facility], [address], [place], [place] [address]     Chemistries  Recent Labs  Lab 08/15/21 0315 08/17/21 0045  NA 143 133*  K 3.9 3.6  CL 107 95*  CO2 23 17*  GLUCOSE 109* 121*  BUN 9 10  CREATININE 0.55 0.57  CALCIUM 8.8* 8.4*  AST 89* 66*  ALT 43 40  ALKPHOS 60 61  BILITOT 0.1* 2.1*   ------------------------------------------------------------------------------------------------------------------  ------------------------------------------------------------------------------------------------------------------ GFR: Estimated Creatinine Clearance: 63.5 mL/min (by C-G formula based on SCr of 0.57 mg/dL). Liver Function Tests: Recent Labs  Lab 08/15/21 0315 08/17/21 0045  AST 89* 66*  ALT 43 40  ALKPHOS 60 61  BILITOT 0.1* 2.1*  PROT 7.3 7.7  ALBUMIN 4.5 4.5   Recent Labs  Lab 08/17/21 0045  LIPASE 64*   No results for input(s): AMMONIA in the last 168 hours. Coagulation Profile: No results for input(s): INR, PROTIME in the last 168 hours. Cardiac Enzymes:  No results for input(s): CKTOTAL, CKMB, CKMBINDEX, TROPONINI in the last 168 hours. BNP (last 3 results) No results for input(s): PROBNP in the last 8760 hours. HbA1C: No results for input(s): HGBA1C in the last 72 hours. CBG: Recent Labs  Lab 08/17/21 0045  GLUCAP 128*   Lipid Profile: No results for input(s): CHOL, HDL, LDLCALC, TRIG, CHOLHDL, LDLDIRECT in the last 72 hours. Thyroid Function Tests: No results for input(s): TSH, T4TOTAL, FREET4, T3FREE, THYROIDAB in the last 72 hours. Anemia Panel: No results for input(s): VITAMINB12, FOLATE, FERRITIN, TIBC, IRON, RETICCTPCT in the last 72  hours.  --------------------------------------------------------------------------------------------------------------- Urine analysis:    Component Value Date/Time   COLORURINE YELLOW 08/17/2021 0135   APPEARANCEUR CLEAR 08/17/2021 0135   APPEARANCEUR Clear 12/07/2013 0133   LABSPEC 1.018 08/17/2021 0135   LABSPEC 1.006 12/07/2013 0133   PHURINE 6.0 08/17/2021 0135   GLUCOSEU NEGATIVE 08/17/2021 0135   GLUCOSEU Negative 12/07/2013 0133   HGBUR MODERATE (A) 08/17/2021 0135   BILIRUBINUR NEGATIVE 08/17/2021 0135   BILIRUBINUR Negative 12/07/2013 0133   KETONESUR 80 (A) 08/17/2021 0135   PROTEINUR 100 (A) 08/17/2021 0135   UROBILINOGEN 0.2 04/28/2015 1559   NITRITE NEGATIVE 08/17/2021 0135   LEUKOCYTESUR NEGATIVE 08/17/2021 0135   LEUKOCYTESUR Negative 12/07/2013 0133      Imaging Results:    No results found.     Assessment & Plan:    Principal Problem:   Alcohol withdrawal (HCC) Active Problems:   Substance abuse (HCC)   High anion gap metabolic acidosis   Intractable nausea and vomiting   Alcohol withdrawal Patient only drinks 1/5 a day Last drink 22nd at 6 AM Patient was tremulous and felt like she was going to have a seizure at presentation Improved with CIWA Continue CIWA protocol Continue to monitor High anion gap metabolic acidosis Bicarb 17, gap 21 VBG pending Likely multifactorial with lactic acidosis initially 3.7, and alcoholic ketoacidosis as patient has not been eating and has been vomiting.  Urine shows ketones Continue fluids Trend in the a.m. Counseled on the importance of alcohol cessation Substance abuse UDS positive for THC Last THC use was 2 days ago Continue to monitor Intractable nausea and vomiting Continue Zofran IV N.p.o. except for sips with meds Advance diet as tolerated    DVT Prophylaxis-   Heparin - SCDs   AM Labs Ordered, also please review Full Orders  Family Communication: No family at bedside  Code Status:   FULL  Admission status: Observation  Disposition: Anticipated Discharge date 24-48 hours Discharge to home  Time spent in minutes : [place]

## 2021-08-17 NOTE — Progress Notes (Signed)
Patient requesting nicotine patch & PRN benadryl, MD made aware

## 2021-08-17 NOTE — ED Provider Notes (Signed)
[facility] EMERGENCY DEPARTMENT Provider Note   CSN: [id number] Arrival date & time: 08/17/21  0038     History  Chief Complaint  Patient presents with   Abdominal Pain    Olivia Lester is a 46 y.o. female.  The history is provided by the patient.  Abdominal Pain She has history of polysubstance abuse, alcohol abuse, alcohol withdrawal seizures, pancreatitis, hepatitis C and comes in complaining of generalized abdominal pain and vomiting all day today.  She also feels like she is going through alcohol withdrawal.  Last alcohol consumption was about 6 PM yesterday.  She normally drinks 1/5 of liquor a day.  She denies fever or chills or sweats.  She denies any diarrhea.   Home Medications Prior to Admission medications   Medication Sig Start Date End Date Taking? Authorizing Provider  amitriptyline (ELAVIL) 50 MG tablet Take 50 mg by mouth at bedtime. 03/24/21   [provider]  amLODipine (NORVASC) 5 MG tablet Take 1 tablet (5 mg total) by mouth daily. Patient not taking: Reported on 10/01/2020 10/28/19   Roxan Hockey, MD  chlordiazePOXIDE (LIBRIUM) 25 MG capsule 50mg  PO TID x 1D, then 25-50mg  PO BID X 1D, then 25-50mg  PO QD X 1D 31/51/76   Delora Fuel, MD  folic acid (FOLVITE) 1 MG tablet Take 1 tablet (1 mg total) by mouth daily. 10/29/19   Roxan Hockey, MD  lidocaine (LIDODERM) 5 % Place 1 patch onto the skin daily. Remove & Discard patch within 12 hours or as directed by MD 07/12/20   Heath Lark D, DO  lipase/protease/amylase 24000-76000 units CPEP Take 1 capsule (24,000 Units total) by mouth 3 (three) times daily before meals. Patient not taking: No sig reported 10/29/19   Roxan Hockey, MD  magnesium 30 MG tablet Take 1 tablet (30 mg total) by mouth 2 (two) times daily. 16/07/37   Delora Fuel, MD  methocarbamol (ROBAXIN) 500 MG tablet Take 1 tablet (500 mg total) by mouth every 8 (eight) hours as needed for muscle spasms. 07/11/20   Manuella Ghazi, Pratik D, DO  Multiple  Vitamin (MULTIVITAMIN WITH MINERALS) TABS tablet Take 1 tablet by mouth every other day.  Patient not taking: No sig reported    [provider]  pantoprazole (PROTONIX) 40 MG tablet Take 1 tablet (40 mg total) by mouth daily. 06/03/20 09/01/20  Harvest Dark, MD  potassium chloride SA (KLOR-CON) 20 MEQ tablet Take 1 tablet (20 mEq total) by mouth 2 (two) times daily. 10/62/69   Delora Fuel, MD  promethazine (PHENERGAN) 25 MG tablet Take 1 tablet (25 mg total) by mouth every 6 (six) hours as needed for nausea or vomiting. 08/10/20   Veryl Speak, MD      Allergies    Toradol [ketorolac tromethamine], Acetaminophen, and Asa [aspirin]    Review of Systems   Review of Systems  Gastrointestinal:  Positive for abdominal pain.  All other systems reviewed and are negative.  Physical Exam Updated Vital Signs BP (!) 153/95    Pulse 75    Temp 97.9 F (36.6 C)    Resp 18    Ht 5\' 7"  (1.702 m)    Wt 45.3 kg    SpO2 96%    BMI 15.64 kg/m  Physical Exam Vitals and nursing note reviewed.  46 year old female, very anxious and tremulous, but in no acute distress. Vital signs are significant for elevated blood pressure. Oxygen saturation is 96%, which is normal. Head is normocephalic and atraumatic. PERRLA, EOMI. Oropharynx  is clear. Neck is nontender and supple without adenopathy or JVD. Back is nontender and there is no CVA tenderness. Lungs are clear without rales, wheezes, or rhonchi. Chest is nontender. Heart has regular rate and rhythm without murmur. Abdomen is soft, flat, with mild to moderate tenderness diffusely.  There is no rebound or guarding.  Peristalsis is hypoactive Extremities have no cyanosis or edema, full range of motion is present. Skin is warm and dry without rash. Neurologic: Mental status is normal, cranial nerves are intact, moves all extremities equally.  She is markedly tremulous consistent with alcohol withdrawal.  ED Results / Procedures / Treatments    Labs (all labs ordered are listed, but only abnormal results are displayed) Labs Reviewed  LACTIC ACID, PLASMA - Abnormal; Notable for the following components:      Result Value   Lactic Acid, Venous 2.2 (*)    All other components within normal limits  COMPREHENSIVE METABOLIC PANEL - Abnormal; Notable for the following components:   Sodium 133 (*)    Chloride 95 (*)    CO2 17 (*)    Glucose, Bld 121 (*)    Calcium 8.4 (*)    AST 66 (*)    Total Bilirubin 2.1 (*)    Anion gap 21 (*)    All other components within normal limits  LIPASE, BLOOD - Abnormal; Notable for the following components:   Lipase 64 (*)    All other components within normal limits  URINALYSIS, ROUTINE W REFLEX MICROSCOPIC - Abnormal; Notable for the following components:   Hgb urine dipstick MODERATE (*)    Ketones, ur 80 (*)    Protein, ur 100 (*)    Bacteria, UA RARE (*)    All other components within normal limits  RAPID URINE DRUG SCREEN, HOSP PERFORMED - Abnormal; Notable for the following components:   Tetrahydrocannabinol POSITIVE (*)    All other components within normal limits  CBG MONITORING, ED - Abnormal; Notable for the following components:   Glucose-Capillary 128 (*)    All other components within normal limits  RESP PANEL BY RT-PCR (FLU A&B, COVID) ARPGX2  ETHANOL  CBC WITH DIFFERENTIAL/PLATELET  PREGNANCY, URINE    EKG EKG Interpretation  Date/Time:  Tuesday August 17 2021 00:50:36 EST Ventricular Rate:  91 PR Interval:  141 QRS Duration: 96 QT Interval:  371 QTC Calculation: 457 R Axis:   68 Text Interpretation: Sinus rhythm Right atrial enlargement Baseline wander in lead(s) II When compared with ECG of 08/15/2021, No significant change was found Confirmed by Delora Fuel ([id number]) on 08/17/2021 12:55:41 AM  Procedures Procedures  Cardiac monitor shows sinus tachycardia per my interpretation.  Medications Ordered in ED Medications  LORazepam (ATIVAN) injection 0-4 mg (  Intravenous See Alternative 08/17/21 0323)    Or  LORazepam (ATIVAN) tablet 0-4 mg (1 mg Oral Given 08/17/21 0323)  LORazepam (ATIVAN) injection 0-4 mg (has no administration in time range)    Or  LORazepam (ATIVAN) tablet 0-4 mg (has no administration in time range)  thiamine tablet 100 mg (has no administration in time range)    Or  thiamine (B-1) injection 100 mg (has no administration in time range)  fentaNYL (SUBLIMAZE) injection 50 mcg (has no administration in time range)  lactated ringers bolus 1,000 mL (0 mLs Intravenous Stopped 08/17/21 0245)  ondansetron (ZOFRAN) injection 4 mg (4 mg Intravenous Given 08/17/21 0146)  morphine 4 MG/ML injection 4 mg (4 mg Intravenous Given 08/17/21 0148)  diphenhydrAMINE (BENADRYL) injection 25  mg (25 mg Intravenous Given 08/17/21 0147)  LORazepam (ATIVAN) injection 1 mg (1 mg Intravenous Given 08/17/21 0149)  lactated ringers bolus 1,000 mL (0 mLs Intravenous Stopped 08/17/21 0435)    ED Course/ Medical Decision Making/ A&P                           Medical Decision Making Amount and/or Complexity of Data Reviewed Labs: ordered.  Risk OTC drugs. Prescription drug management.   Abdominal pain, nausea, vomiting.  Possible gastritis.  Possible part of alcohol withdrawal.  Tremulousness which clearly is evidence of alcohol withdrawal syndrome.  Old records are reviewed, and she has numerous ED visits for alcohol related issues and has had to be admitted for alcohol withdrawal.  She will be given IV fluids, morphine, ondansetron and will check screening labs.  Labs are significant for elevated anion gap with as well as slightly elevated lactic acid level.  Lactic acid level is felt to be secondary to dehydration and not sepsis, additional IV fluids are ordered..  Mild elevation of lipase is not felt to be clinically significant.  Mild hyponatremia is present, but not felt to be clinically significant.  Following lorazepam, her agitation and  tremulousness has improved dramatically.  Urinalysis is positive for ketones, consistent with vomiting and alcoholic ketoacidosis.  Case is discussed with Dr. Clearence Ped of [facility] hospital[facility], who agrees to admit the patient.  CRITICAL CARE Performed by: Delora Fuel Total critical care time: 50 minutes Critical care time was exclusive of separately billable procedures and treating other patients. Critical care was necessary to treat or prevent imminent or life-threatening deterioration. Critical care was time spent personally by me on the following activities: development of treatment plan with patient and/or surrogate as well as nursing, discussions with consultants, evaluation of patient's response to treatment, examination of patient, obtaining history from patient or surrogate, ordering and performing treatments and interventions, ordering and review of laboratory studies, ordering and review of radiographic studies, pulse oximetry and re-evaluation of patient's condition.       Final Clinical Impression(s) / ED Diagnoses Final diagnoses:  Alcoholic ketoacidosis  Alcohol withdrawal, uncomplicated (HCC)  Elevated lactic acid level  Hyponatremia  Elevated lipase  Elevated transaminase level    Rx / DC Orders ED Discharge Orders     None         Delora Fuel, MD 09/32[birth year]67 [phone]

## 2021-08-17 NOTE — Care Plan (Signed)
This 46 years old female with PMH significant of alcohol abuse, gastric ulcer, hepatitis C, pancreatitis, polysubstance abuse, seizures  secondary to alcohol withdrawal presented in the ED with complaints of tremulousness consistent with alcohol withdrawal.  Patient is admitted for alcohol withdrawal and started on CIWA protocol.  Patient was seen and examined at bedside.  She seems improved.  She still has some tremulousness.  Lactic acidosis resolved with IV hydration.

## 2021-08-18 ENCOUNTER — Other Ambulatory Visit: Payer: Self-pay

## 2021-08-18 ENCOUNTER — Encounter (HOSPITAL_COMMUNITY): Payer: Self-pay

## 2021-08-18 ENCOUNTER — Emergency Department (HOSPITAL_COMMUNITY)
Admission: EM | Admit: 2021-08-18 | Discharge: 2021-08-18 | Disposition: A | Discharge status: Home / Self Care | Payer: Medicaid Other | Attending: Emergency Medicine | Admitting: Emergency Medicine

## 2021-08-18 DIAGNOSIS — Y903 Blood alcohol level of 60-79 mg/100 ml: Secondary | ICD-10-CM | POA: Insufficient documentation

## 2021-08-18 DIAGNOSIS — R1114 Bilious vomiting: Secondary | ICD-10-CM

## 2021-08-18 DIAGNOSIS — F10120 Alcohol abuse with intoxication, uncomplicated: Secondary | ICD-10-CM | POA: Insufficient documentation

## 2021-08-18 DIAGNOSIS — F10920 Alcohol use, unspecified with intoxication, uncomplicated: Secondary | ICD-10-CM

## 2021-08-18 LAB — CBC WITH DIFFERENTIAL/PLATELET
Abs Immature Granulocytes: 0.01 10*3/uL (ref 0.00–0.07)
Abs Immature Granulocytes: 0.02 10*3/uL (ref 0.00–0.07)
Basophils Absolute: 0 10*3/uL (ref 0.0–0.1)
Basophils Absolute: 0 10*3/uL (ref 0.0–0.1)
Basophils Relative: 0 %
Basophils Relative: 0 %
Eosinophils Absolute: 0.1 10*3/uL (ref 0.0–0.5)
Eosinophils Absolute: 0.1 10*3/uL (ref 0.0–0.5)
Eosinophils Relative: 2 %
Eosinophils Relative: 1 %
HCT: 40.5 % (ref 36.0–46.0)
HCT: 36.8 % (ref 36.0–46.0)
Hemoglobin: 13.5 g/dL (ref 12.0–15.0)
Hemoglobin: 12.4 g/dL (ref 12.0–15.0)
Immature Granulocytes: 0 %
Immature Granulocytes: 0 %
Lymphocytes Relative: 40 %
Lymphocytes Relative: 30 %
Lymphs Abs: 2.2 10*3/uL (ref 0.7–4.0)
Lymphs Abs: 1.6 10*3/uL (ref 0.7–4.0)
MCH: 32.8 pg (ref 26.0–34.0)
MCH: 31.9 pg (ref 26.0–34.0)
MCHC: 33.3 g/dL (ref 30.0–36.0)
MCHC: 33.7 g/dL (ref 30.0–36.0)
MCV: 98.5 fL (ref 80.0–100.0)
MCV: 94.6 fL (ref 80.0–100.0)
Monocytes Absolute: 0.4 10*3/uL (ref 0.1–1.0)
Monocytes Absolute: 0.4 10*3/uL (ref 0.1–1.0)
Monocytes Relative: 7 %
Monocytes Relative: 7 %
Neutro Abs: 2.9 10*3/uL (ref 1.7–7.7)
Neutro Abs: 3.4 10*3/uL (ref 1.7–7.7)
Neutrophils Relative %: 51 %
Neutrophils Relative %: 62 %
Platelets: 95 10*3/uL — ABNORMAL LOW (ref 150–400)
Platelets: 92 10*3/uL — ABNORMAL LOW (ref 150–400)
RBC: 4.11 MIL/uL (ref 3.87–5.11)
RBC: 3.89 MIL/uL (ref 3.87–5.11)
RDW: 13.5 % (ref 11.5–15.5)
RDW: 13.2 % (ref 11.5–15.5)
WBC: 5.6 10*3/uL (ref 4.0–10.5)
WBC: 5.5 10*3/uL (ref 4.0–10.5)
nRBC: 0 % (ref 0.0–0.2)
nRBC: 0 % (ref 0.0–0.2)

## 2021-08-18 LAB — COMPREHENSIVE METABOLIC PANEL
ALT: 33 U/L (ref 0–44)
ALT: 38 U/L (ref 0–44)
AST: 49 U/L — ABNORMAL HIGH (ref 15–41)
AST: 67 U/L — ABNORMAL HIGH (ref 15–41)
Albumin: 4.2 g/dL (ref 3.5–5.0)
Albumin: 4.2 g/dL (ref 3.5–5.0)
Alkaline Phosphatase: 51 U/L (ref 38–126)
Alkaline Phosphatase: 47 U/L (ref 38–126)
Anion gap: 13 (ref 5–15)
Anion gap: 11 (ref 5–15)
BUN: 7 mg/dL (ref 6–20)
BUN: 5 mg/dL — ABNORMAL LOW (ref 6–20)
CO2: 23 mmol/L (ref 22–32)
CO2: 25 mmol/L (ref 22–32)
Calcium: 8.8 mg/dL — ABNORMAL LOW (ref 8.9–10.3)
Calcium: 9.2 mg/dL (ref 8.9–10.3)
Chloride: 96 mmol/L — ABNORMAL LOW (ref 98–111)
Chloride: 99 mmol/L (ref 98–111)
Creatinine, Ser: 0.46 mg/dL (ref 0.44–1.00)
Creatinine, Ser: 0.41 mg/dL — ABNORMAL LOW (ref 0.44–1.00)
GFR, Estimated: >60 mL/min (ref >60)
GFR, Estimated: >60 mL/min (ref >60)
Glucose, Bld: 82 mg/dL (ref 70–99)
Glucose, Bld: 91 mg/dL (ref 70–99)
Potassium: 3.5 mmol/L (ref 3.5–5.1)
Potassium: 3 mmol/L — ABNORMAL LOW (ref 3.5–5.1)
Sodium: 132 mmol/L — ABNORMAL LOW (ref 135–145)
Sodium: 135 mmol/L (ref 135–145)
Total Bilirubin: 1 mg/dL (ref 0.3–1.2)
Total Bilirubin: 0.4 mg/dL (ref 0.3–1.2)
Total Protein: 7 g/dL (ref 6.5–8.1)
Total Protein: 6.9 g/dL (ref 6.5–8.1)

## 2021-08-18 LAB — ETHANOL: Alcohol, Ethyl (B): 77 mg/dL — ABNORMAL HIGH (ref <10)

## 2021-08-18 LAB — LIPASE, BLOOD: Lipase: 50 U/L (ref 11–51)

## 2021-08-18 LAB — PHOSPHORUS: Phosphorus: 2 mg/dL — ABNORMAL LOW (ref 2.5–4.6)

## 2021-08-18 LAB — MAGNESIUM: Magnesium: 1.5 mg/dL — ABNORMAL LOW (ref 1.7–2.4)

## 2021-08-18 MED ORDER — MAGNESIUM SULFATE 2 GM/50ML IV SOLN
2.0000 g | Freq: Once | INTRAVENOUS | Status: AC
  Administered 2021-08-18: 09:00:00 2 g via INTRAVENOUS
  Filled 2021-08-18: qty 50

## 2021-08-18 MED ORDER — CHLORDIAZEPOXIDE HCL 5 MG PO CAPS: 25.0000 mg | ORAL_CAPSULE | Freq: Four times a day (QID) | ORAL | Status: DC | PRN

## 2021-08-18 MED ORDER — LORAZEPAM 2 MG/ML IJ SOLN
1.0000 mg | Freq: Once | INTRAMUSCULAR | Status: AC
  Administered 2021-08-18: 21:00:00 1 mg via INTRAMUSCULAR
  Filled 2021-08-18: qty 1

## 2021-08-18 MED ORDER — HYDROXYZINE HCL 25 MG PO TABS: 25.0000 mg | ORAL_TABLET | Freq: Four times a day (QID) | ORAL | Status: DC | PRN

## 2021-08-18 MED ORDER — SODIUM CHLORIDE 0.9 % IV BOLUS
1000.0000 mL | Freq: Once | INTRAVENOUS | Status: AC
  Administered 2021-08-18: 22:00:00 1000 mL via INTRAVENOUS

## 2021-08-18 MED ORDER — ONDANSETRON 4 MG PO TBDP: 4.0000 mg | ORAL_TABLET | Freq: Four times a day (QID) | ORAL | Status: DC | PRN

## 2021-08-18 MED ORDER — CHLORDIAZEPOXIDE HCL 5 MG PO CAPS
25.0000 mg | ORAL_CAPSULE | Freq: Once | ORAL | Status: AC
  Administered 2021-08-18: 11:00:00 25 mg via ORAL
  Filled 2021-08-18: qty 5

## 2021-08-18 MED ORDER — CLONIDINE HCL 0.2 MG PO TABS
0.2000 mg | ORAL_TABLET | Freq: Three times a day (TID) | ORAL | Status: DC | PRN
  Administered 2021-08-18: 16:00:00 0.2 mg via ORAL
  Filled 2021-08-18: qty 1

## 2021-08-18 MED ORDER — LOPERAMIDE HCL 2 MG PO CAPS: 2.0000 mg | ORAL_CAPSULE | ORAL | Status: DC | PRN

## 2021-08-18 MED ORDER — CHLORDIAZEPOXIDE HCL 5 MG PO CAPS
25.0000 mg | ORAL_CAPSULE | Freq: Four times a day (QID) | ORAL | Status: DC
  Administered 2021-08-18: 14:00:00 25 mg via ORAL
  Filled 2021-08-18: qty 5

## 2021-08-18 MED ORDER — CHLORDIAZEPOXIDE HCL 5 MG PO CAPS: 25.0000 mg | ORAL_CAPSULE | ORAL | Status: DC

## 2021-08-18 MED ORDER — CHLORDIAZEPOXIDE HCL 5 MG PO CAPS: 25.0000 mg | ORAL_CAPSULE | Freq: Every day | ORAL | Status: DC

## 2021-08-18 MED ORDER — CHLORDIAZEPOXIDE HCL 5 MG PO CAPS: 25.0000 mg | ORAL_CAPSULE | Freq: Three times a day (TID) | ORAL | Status: DC

## 2021-08-18 NOTE — ED Triage Notes (Signed)
Patient also admits to one shot of liquor.

## 2021-08-18 NOTE — ED Provider Notes (Signed)
[facility] EMERGENCY DEPARTMENT Provider Note   CSN: [id number] Arrival date & time: 08/18/21  1958     History  Chief Complaint  Patient presents with   Abdominal Pain   Emesis    Dariela Stefanski is a 46 y.o. female.  HPI Patient presents same day as leaving [facility] from our inpatient unit now with concern for nausea, vomiting.  She now presents with concerns as above, no new fever, no new obvious pain other than abdominal discomfort with vomiting.  She states that she has not had anything to drink since discharge.  Additional details obtained by nursing staff.  Apparently the patient left AGAINST MEDICAL ADVICE after cursing at nursing staff, throwing blood reportedly.  Patient simply states that she felt she needed to go home. She knowledges long history of alcohol abuse, last drink 72 hours ago.    Home Medications Prior to Admission medications   Medication Sig Start Date End Date Taking? Authorizing Provider  amitriptyline (ELAVIL) 50 MG tablet Take 50 mg by mouth at bedtime. Patient not taking: Reported on 08/17/2021 03/24/21   [provider]  amLODipine (NORVASC) 5 MG tablet Take 1 tablet (5 mg total) by mouth daily. Patient not taking: Reported on 10/01/2020 10/28/19   Roxan Hockey, MD  chlordiazePOXIDE (LIBRIUM) 25 MG capsule 50mg  PO TID x 1D, then 25-50mg  PO BID X 1D, then 25-50mg  PO QD X 1D Patient not taking: Reported on 6/60/6301 60/10/93   Delora Fuel, MD  folic acid (FOLVITE) 1 MG tablet Take 1 tablet (1 mg total) by mouth daily. Patient not taking: Reported on 08/17/2021 10/29/19   Roxan Hockey, MD  lidocaine (LIDODERM) 5 % Place 1 patch onto the skin daily. Remove & Discard patch within 12 hours or as directed by MD Patient not taking: Reported on 08/17/2021 07/12/20   Heath Lark D, DO  lipase/protease/amylase 24000-76000 units CPEP Take 1 capsule (24,000 Units total) by mouth 3 (three) times daily before meals. Patient not taking: Reported  on 01/07/2020 10/29/19   Roxan Hockey, MD  magnesium 30 MG tablet Take 1 tablet (30 mg total) by mouth 2 (two) times daily. Patient not taking: Reported on 2/35/5732 20/25/42   Delora Fuel, MD  methocarbamol (ROBAXIN) 500 MG tablet Take 1 tablet (500 mg total) by mouth every 8 (eight) hours as needed for muscle spasms. Patient not taking: Reported on 08/17/2021 07/11/20   Heath Lark D, DO  Multiple Vitamin (MULTIVITAMIN WITH MINERALS) TABS tablet Take 1 tablet by mouth every other day.  Patient not taking: Reported on 01/07/2020    [provider]  pantoprazole (PROTONIX) 40 MG tablet Take 1 tablet (40 mg total) by mouth daily. 06/03/20 09/01/20  Harvest Dark, MD  potassium chloride SA (KLOR-CON) 20 MEQ tablet Take 1 tablet (20 mEq total) by mouth 2 (two) times daily. Patient not taking: Reported on 01/28/2375 28/31/51   Delora Fuel, MD  promethazine (PHENERGAN) 25 MG tablet Take 1 tablet (25 mg total) by mouth every 6 (six) hours as needed for nausea or vomiting. Patient not taking: Reported on 08/17/2021 08/10/20   Veryl Speak, MD      Allergies    Toradol [ketorolac tromethamine] and Diona Fanti [aspirin]    Review of Systems   Review of Systems  Constitutional:        Per HPI, otherwise negative  HENT:         Per HPI, otherwise negative  Respiratory:         Per HPI,  otherwise negative  Cardiovascular:        Per HPI, otherwise negative  Gastrointestinal:  Positive for nausea and vomiting.  Endocrine:       Negative aside from HPI  Genitourinary:        Neg aside from HPI   Musculoskeletal:        Per HPI, otherwise negative  Skin: Negative.   Neurological:  Negative for syncope.   Physical Exam Updated Vital Signs BP (!) 151/98    Pulse (!) 105    Temp 97.8 F (36.6 C) (Oral)    Ht 5\' 6"  (1.676 m)    Wt 53 kg    SpO2 98%    BMI 18.86 kg/m  Physical Exam Vitals and nursing note reviewed.  Constitutional:      Comments: Substantially older than stated age  appearing female awake and alert sitting upright speaking clearly  HENT:     Head: Normocephalic and atraumatic.  Eyes:     Conjunctiva/sclera: Conjunctivae normal.  Cardiovascular:     Rate and Rhythm: Normal rate and regular rhythm.  Pulmonary:     Effort: Pulmonary effort is normal. No respiratory distress.     Breath sounds: Normal breath sounds. No stridor.  Abdominal:     General: There is no distension.     Tenderness: There is no abdominal tenderness.  Skin:    General: Skin is warm and dry.  Neurological:     Mental Status: She is alert and oriented to person, place, and time.     Cranial Nerves: No cranial nerve deficit.    ED Results / Procedures / Treatments   Labs (all labs ordered are listed, but only abnormal results are displayed) Labs Reviewed  ETHANOL  COMPREHENSIVE METABOLIC PANEL  LIPASE, BLOOD  CBC WITH DIFFERENTIAL/PLATELET    EKG None  Radiology Abd 1 View (KUB)  Result Date: 08/17/2021 CLINICAL DATA:  Abdominal pain. EXAM: ABDOMEN - 1 VIEW COMPARISON:  CT abdomen and pelvis without contrast 03/06/2021 FINDINGS: The bowel gas pattern is normal. No radio-opaque calculi or other significant radiographic abnormality are seen. The visceral shadows are stable with prior studies showing severe hepatic steatosis. L5-S1 posterior fusion rods and pedicle screws are again shown. IMPRESSION: No acute radiographic findings or radiographic interval changes. Electronically Signed   By: Telford Nab M.D.   On: 08/17/2021 06:39    Procedures Procedures    Medications Ordered in ED Medications  sodium chloride 0.9 % bolus 1,000 mL (has no administration in time range)  LORazepam (ATIVAN) injection 1 mg (1 mg Intramuscular Given 08/18/21 2043)    ED Course/ Medical Decision Making/ A&P As above additional details obtained on chart review from discharge earlier in the day, and nursing staff. Patient has 6 prior ED visits in the past 6 months.  11:17  PM Patient put on her close as if to leave.  I discussed results with her, notable for alcohol elevation in spite of her stating that she has not had anything to drink in 3 days.   I interpreted the patient's x-ray from yesterday, unremarkable.  Labs here are reassuring interpreted by me, patient received fluid resuscitation, benzodiazepine, had no additional vomiting was discharged with appropriate medication facilitate alcohol cessation.  Her history of alcohol addiction is a social determinant affecting her care.                        Final Clinical Impression(s) / ED Diagnoses Final diagnoses:  Alcoholic intoxication without complication (HCC)  Bilious vomiting with nausea     Carmin Muskrat, MD 08/18/21 2318

## 2021-08-18 NOTE — Progress Notes (Signed)
HPI:    Olivia Lester  is a 46 y.o. 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  Patient reports that she has had diarrhea and been throwing up for 18 hours straight.  She reports that she has vomited so much that she has been lightheaded.  She has had muscle cramps that make her feel like she is going to have a seizure.  She reports she has had alcohol withdrawal seizures in the past.  She has associated abdominal pain that is diffuse but worst in the lower abdomen.  Feels like menstrual cramps, but patient is no longer has menstrual cycle.  Patient has associated dysuria as well.  Patient reports she is throwing up every 15-20 minutes.  Her emesis now appears as green liquid with no blood.  She reports a degree because she has been drinking green Gatorade.  Her last normal meal was on the 20th.  Patient reports that she normally does not eat on the weekends because she drinks all day.  She drinks about 1/5 of liquor per day.  Her last drink was Sunday morning.  Patient reports her last bowel movement was in the middle of last week.  She reports she only goes about once a week and has to take a laxative to make that happen.  Patient has taken Tylenol for her abdominal pain with no relief.  She reports her abdominal pain is constant.  She has not had a fever.  She does report a rash on her lower abdomen that was itchy and resolved with Benadryl.  She has had headaches as well, but reports that that is relatively normal for her.  Patient has no other complaints at this time.  Patient is a current smoker a pack a day.  She does drink alcohol.  She does use marijuana.  Her last marijuana use was 2 days ago.  She is vaccinated for COVID.  Patient is full code.  In the  ED Temp 97.9, heart rate 71 -100, respiratory rate 15-19, blood pressure 155/92, satting at 98% No leukocytosis, hemoglobin 15.0 Chemistry panel reveals a slight hyponatremia 133, decreased bicarb at 17, increased gap at 21 Lipase is 64, AST is 66, ALT is 40, T bili is 2.1 Lactic acid is initially 3.7 and then 2.2 EKG shows a heart rate 91, sinus rhythm, QTC 457 Patient was given Benadryl, fentanyl, 2 L LR, started on the CIWA protocol, given morphine and Zofran ED provider reports that at presentation patient was very shaky but has done better since CIWA Admission was requested for alcohol withdrawal and alcoholic ketoacidosis  Subjective Patient wishes to go home.  Nursing staff is reporting hallucination.  None now.  Mildly agitated.  She is not interested in getting help with sobering up   Physical Exam:   Vitals  Blood pressure (!) 152/97, pulse 71, temperature 97.8 F (36.6 C), temperature source Oral, resp. rate 18,  height 5\' 6"  (1.676 m), weight 53 kg, SpO2 100 %.   1.  General: Patient lying supine in bed,  no acute distress   2. Psychiatric: Alert and oriented x 2, mood is irritable and behavior normal for situation, cooperative with exam   3. Neurologic: Speech and language are normal, face is symmetric, moves all 4 extremities voluntarily, at baseline without acute deficits on limited exam   4. HEENMT:  Head is atraumatic, normocephalic, pupils reactive to light, neck is supple, trachea is midline, mucous membranes are moist, poor dentition   5. Respiratory : Lungs are clear to auscultation bilaterally without wheezing, rhonchi, rales, no cyanosis, no increase in work of breathing or accessory muscle use   6. Cardiovascular : Heart rate slightly tachycardic, rhythm is regular, no murmurs, rubs or gallops, no peripheral edema, peripheral pulses palpated   7. Gastrointestinal:  Abdomen is soft, nondistended, diffusely tender and worse at the epigastric region, bowel  sounds active, no masses or organomegaly palpated   8. Skin:  Skin is warm, dry and intact without rashes, acute lesions, or ulcers on limited exam   9.Musculoskeletal:  No acute deformities or trauma, no asymmetry in tone, no peripheral edema, peripheral pulses palpated, no tenderness to palpation in the extremities     Data Review:    CBC Recent Labs  Lab 08/15/21 0315 08/17/21 0045 08/18/21 0441  WBC 7.8 8.3 5.6  HGB 15.0 15.0 13.5  HCT 44.3 42.9 40.5  PLT 203 163 95*  MCV 94.9 94.9 98.5  MCH 32.1 33.2 32.8  MCHC 33.9 35.0 33.3  RDW 14.3 13.9 13.5  LYMPHSABS 3.5 1.4 2.2  MONOABS 0.6 0.4 0.4  EOSABS 0.0 0.0 0.1  BASOSABS 0.1 0.0 0.0   ------------------------------------------------------------------------------------------------------------------  Results for orders placed or performed during the hospital encounter of 08/17/21 (from the past 48 hour(s))  CBG monitoring, ED     Status: Abnormal   Collection Time: 08/17/21 12:45 AM  Result Value Ref Range   Glucose-Capillary 128 (H) 70 - 99 mg/dL    Comment: Glucose reference range applies only to samples taken after fasting for at least 8 hours.  Comprehensive metabolic panel     Status: Abnormal   Collection Time: 08/17/21 12:45 AM  Result Value Ref Range   Sodium 133 (L) 135 - 145 mmol/L    Comment: DELTA CHECK NOTED   Potassium 3.6 3.5 - 5.1 mmol/L   Chloride 95 (L) 98 - 111 mmol/L   CO2 17 (L) 22 - 32 mmol/L   Glucose, Bld 121 (H) 70 - 99 mg/dL    Comment: Glucose reference range applies only to samples taken after fasting for at least 8 hours.   BUN 10 6 - 20 mg/dL   Creatinine, Ser 0.57 0.44 - 1.00 mg/dL   Calcium 8.4 (L) 8.9 - 10.3 mg/dL   Total Protein 7.7 6.5 - 8.1 g/dL   Albumin 4.5 3.5 - 5.0 g/dL   AST 66 (H) 15 - 41 U/L   ALT 40 0 - 44 U/L   Alkaline Phosphatase 61 38 - 126 U/L   Total Bilirubin 2.1 (H) 0.3 - 1.2 mg/dL   GFR, Estimated >60 >60 mL/min    Comment: (NOTE) Calculated using the  CKD-EPI Creatinine Equation (2021)    Anion gap 21 (H) 5 - 15    Comment: Performed at [facility], [address], [place], [place] [address]  Ethanol     Status: None   Collection Time: 08/17/21 12:45 AM  Result Value Ref Range   Alcohol, Ethyl (B) <10 <10 mg/dL    Comment: (NOTE) Lowest detectable limit for serum alcohol is 10 mg/dL.  For medical purposes only. Performed at [facility], [address], [place], [place] [address]   CBC with Differential     Status: None   Collection Time: 08/17/21 12:45 AM  Result Value Ref Range   WBC 8.3 4.0 - 10.5 K/uL   RBC 4.52 3.87 - 5.11 MIL/uL   Hemoglobin 15.0 12.0 - 15.0 g/dL   HCT 42.9 36.0 - 46.0 %   MCV 94.9 80.0 - 100.0 fL   MCH 33.2 26.0 - 34.0 pg   MCHC 35.0 30.0 - 36.0 g/dL   RDW 13.9 11.5 - 15.5 %   Platelets 163 150 - 400 K/uL   nRBC 0.0 0.0 - 0.2 %   Neutrophils Relative % 77 %   Neutro Abs 6.4 1.7 - 7.7 K/uL   Lymphocytes Relative 17 %   Lymphs Abs 1.4 0.7 - 4.0 K/uL   Monocytes Relative 5 %   Monocytes Absolute 0.4 0.1 - 1.0 K/uL   Eosinophils Relative 0 %   Eosinophils Absolute 0.0 0.0 - 0.5 K/uL   Basophils Relative 1 %   Basophils Absolute 0.0 0.0 - 0.1 K/uL   Immature Granulocytes 0 %   Abs Immature Granulocytes 0.01 0.00 - 0.07 K/uL    Comment: Performed at [facility], [address] [place], [place] [address]  Lipase, blood     Status: Abnormal   Collection Time: 08/17/21 12:45 AM  Result Value Ref Range   Lipase 64 (H) 11 - 51 U/L    Comment: Performed at [facility], [address] [place], [place] [address]  HIV Antibody (routine testing w rflx)     Status: None   Collection Time: 08/17/21 12:48 AM  Result Value Ref Range   HIV Screen 4th Generation wRfx Non Reactive Non Reactive    Comment: Performed at [facility], [address], [place], [place] [address]  Urinalysis, Routine w reflex microscopic Urine, Clean Catch     Status: Abnormal   Collection Time: 08/17/21  1:35 AM  Result Value  Ref Range   Color, Urine YELLOW YELLOW   APPearance CLEAR CLEAR   Specific Gravity, Urine 1.018 1.005 - 1.030   pH 6.0 5.0 - 8.0   Glucose, UA NEGATIVE NEGATIVE mg/dL   Hgb urine dipstick MODERATE (A) NEGATIVE   Bilirubin Urine NEGATIVE NEGATIVE   Ketones, ur 80 (A) NEGATIVE mg/dL   Protein, ur 100 (A) NEGATIVE mg/dL   Nitrite NEGATIVE NEGATIVE   Leukocytes,Ua NEGATIVE NEGATIVE   RBC / HPF 0-5 0 - 5 RBC/hpf   WBC, UA 0-5 0 - 5 WBC/hpf   Bacteria, UA RARE (A) NONE SEEN   Squamous Epithelial / LPF 0-5 0 - 5   Mucus PRESENT     Comment: Performed at [facility], [address] [place], [place] [address]  Urine rapid drug screen (hosp performed)     Status: Abnormal   Collection Time: 08/17/21  1:35 AM  Result Value Ref Range   Opiates NONE DETECTED NONE DETECTED   Cocaine NONE DETECTED NONE DETECTED   Benzodiazepines NONE DETECTED NONE DETECTED   Amphetamines NONE DETECTED NONE DETECTED   Tetrahydrocannabinol POSITIVE (A) NONE DETECTED   Barbiturates NONE DETECTED NONE DETECTED    Comment: (NOTE) DRUG SCREEN FOR MEDICAL PURPOSES ONLY.  IF CONFIRMATION IS NEEDED FOR ANY PURPOSE, NOTIFY LAB WITHIN  5 DAYS.  LOWEST DETECTABLE LIMITS FOR URINE DRUG SCREEN Drug Class                     Cutoff (ng/mL) Amphetamine and metabolites    1000 Barbiturate and metabolites    200 Benzodiazepine                 604 Tricyclics and metabolites     300 Opiates and metabolites        300 Cocaine and metabolites        300 THC                            50 Performed at [facility], [address], [place], [place] [address]   Lactic acid, plasma     Status: Abnormal   Collection Time: 08/17/21  1:45 AM  Result Value Ref Range   Lactic Acid, Venous 2.2 (HH) 0.5 - 1.9 mmol/L    Comment: CRITICAL RESULT CALLED TO, READ BACK BY AND VERIFIED WITH: Pruitt,G@[id number]  by [email or web address], b 1.24.23 Performed at [facility], [address], [place], [place] [address]   Pregnancy, urine     Status: None    Collection Time: 08/17/21  1:52 AM  Result Value Ref Range   Preg Test, Ur NEGATIVE NEGATIVE    Comment:        THE SENSITIVITY OF THIS METHODOLOGY IS >20 mIU/mL. Performed at [facility], [address] [place], [place] [address]   Resp Panel by RT-PCR (Flu A&B, Covid) Nasopharyngeal Swab     Status: None   Collection Time: 08/17/21  5:06 AM   Specimen: Nasopharyngeal Swab; Nasopharyngeal(NP) swabs in vial transport medium  Result Value Ref Range   SARS Coronavirus 2 by RT PCR NEGATIVE NEGATIVE    Comment: (NOTE) SARS-CoV-2 target nucleic acids are NOT DETECTED.  The SARS-CoV-2 RNA is generally detectable in upper respiratory specimens during the acute phase of infection. The lowest concentration of SARS-CoV-2 viral copies this assay can detect is 138 copies/mL. A negative result does not preclude SARS-Cov-2 infection and should not be used as the sole basis for treatment or other patient management decisions. A negative result may occur with  improper specimen collection/handling, submission of specimen other than nasopharyngeal swab, presence of viral mutation(s) within the areas targeted by this assay, and inadequate number of viral copies(<138 copies/mL). A negative result must be combined with clinical observations, patient history, and epidemiological information. The expected result is Negative.  Fact Sheet for Patients:  EntrepreneurPulse.com.au  Fact Sheet for Healthcare Providers:  IncredibleEmployment.be  This test is no t yet approved or cleared by the [place] FDA and  has been authorized for detection and/or diagnosis of SARS-CoV-2 by FDA under an Emergency Use Authorization (EUA). This EUA will remain  in effect (meaning this test can be used) for the duration of the COVID-19 declaration under Section 564(b)(1) of the Act, 21 U.S.C.section 360bbb-3(b)(1), unless the authorization is terminated  or revoked sooner.        Influenza A by PCR NEGATIVE NEGATIVE   Influenza B by PCR NEGATIVE NEGATIVE    Comment: (NOTE) The Xpert Xpress SARS-CoV-2/FLU/RSV plus assay is intended as an aid in the diagnosis of influenza from Nasopharyngeal swab specimens and should not be used as a sole basis for treatment. Nasal washings and aspirates are unacceptable for Xpert Xpress SARS-CoV-2/FLU/RSV testing.  Fact Sheet for Patients: EntrepreneurPulse.com.au  Fact Sheet for Healthcare  Providers: IncredibleEmployment.be  This test is not yet approved or cleared by the [place] and has been authorized for detection and/or diagnosis of SARS-CoV-2 by FDA under an Emergency Use Authorization (EUA). This EUA will remain in effect (meaning this test can be used) for the duration of the COVID-19 declaration under Section 564(b)(1) of the Act, 21 U.S.C. section 360bbb-3(b)(1), unless the authorization is terminated or revoked.  Performed at [facility], [address], [place], [place] [address]   Blood gas, venous     Status: Abnormal   Collection Time: 08/17/21  5:43 AM  Result Value Ref Range   FIO2 21.00    pH, Ven 7.423 7.250 - 7.430   pCO2, Ven 39.6 (L) 44.0 - 60.0 mmHg   pO2, Ven 76.1 (H) 32.0 - 45.0 mmHg   Bicarbonate 25.6 20.0 - 28.0 mmol/L   Acid-Base Excess 1.5 0.0 - 2.0 mmol/L   O2 Saturation 94.9 %   Patient temperature 36.6    Drawn by 2585     Comment: Performed at [facility], [address] [place], [place] [address]  Lactic acid, plasma     Status: None   Collection Time: 08/17/21  5:44 AM  Result Value Ref Range   Lactic Acid, Venous 0.8 0.5 - 1.9 mmol/L    Comment: Performed at [facility], [address] [place], [place] [address]  MRSA Next Gen by PCR, Nasal     Status: None   Collection Time: 08/17/21  9:04 AM   Specimen: Nasal Mucosa; Nasal Swab  Result Value Ref Range   MRSA by PCR Next Gen NOT DETECTED NOT DETECTED    Comment: (NOTE) The  GeneXpert MRSA Assay (FDA approved for NASAL specimens only), is one component of a comprehensive MRSA colonization surveillance program. It is not intended to diagnose MRSA infection nor to guide or monitor treatment for MRSA infections. Test performance is not FDA approved in patients less than 33 years old. Performed at [facility], [address] [place], [place] [address]   Comprehensive metabolic panel     Status: Abnormal   Collection Time: 08/18/21  4:41 AM  Result Value Ref Range   Sodium 132 (L) 135 - 145 mmol/L   Potassium 3.5 3.5 - 5.1 mmol/L   Chloride 96 (L) 98 - 111 mmol/L   CO2 23 22 - 32 mmol/L   Glucose, Bld 82 70 - 99 mg/dL    Comment: Glucose reference range applies only to samples taken after fasting for at least 8 hours.   BUN 7 6 - 20 mg/dL   Creatinine, Ser 0.46 0.44 - 1.00 mg/dL   Calcium 8.8 (L) 8.9 - 10.3 mg/dL   Total Protein 7.0 6.5 - 8.1 g/dL   Albumin 4.2 3.5 - 5.0 g/dL   AST 49 (H) 15 - 41 U/L   ALT 33 0 - 44 U/L   Alkaline Phosphatase 51 38 - 126 U/L   Total Bilirubin 1.0 0.3 - 1.2 mg/dL   GFR, Estimated >60 >60 mL/min    Comment: (NOTE) Calculated using the CKD-EPI Creatinine Equation (2021)    Anion gap 13 5 - 15    Comment: Performed at [facility], [address], [place], [place] [address]  CBC WITH DIFFERENTIAL     Status: Abnormal   Collection Time: 08/18/21  4:41 AM  Result Value Ref Range   WBC 5.6 4.0 - 10.5 K/uL   RBC 4.11 3.87 - 5.11 MIL/uL   Hemoglobin 13.5 12.0 -  15.0 g/dL   HCT 40.5 36.0 - 46.0 %   MCV 98.5 80.0 - 100.0 fL   MCH 32.8 26.0 - 34.0 pg   MCHC 33.3 30.0 - 36.0 g/dL   RDW 13.5 11.5 - 15.5 %   Platelets 95 (L) 150 - 400 K/uL    Comment: SPECIMEN CHECKED FOR CLOTS Immature Platelet Fraction may be clinically indicated, consider ordering this additional test [id number] DELTA CHECK NOTED PLATELET COUNT CONFIRMED BY SMEAR    nRBC 0.0 0.0 - 0.2 %   Neutrophils Relative % 51 %   Neutro Abs 2.9 1.7 - 7.7 K/uL    Lymphocytes Relative 40 %   Lymphs Abs 2.2 0.7 - 4.0 K/uL   Monocytes Relative 7 %   Monocytes Absolute 0.4 0.1 - 1.0 K/uL   Eosinophils Relative 2 %   Eosinophils Absolute 0.1 0.0 - 0.5 K/uL   Basophils Relative 0 %   Basophils Absolute 0.0 0.0 - 0.1 K/uL   WBC Morphology MORPHOLOGY UNREMARKABLE    RBC Morphology MORPHOLOGY UNREMARKABLE    Immature Granulocytes 0 %   Abs Immature Granulocytes 0.01 0.00 - 0.07 K/uL    Comment: Performed at [facility], [address] [place], [place] [address]  Magnesium     Status: Abnormal   Collection Time: 08/18/21  4:41 AM  Result Value Ref Range   Magnesium 1.5 (L) 1.7 - 2.4 mg/dL    Comment: Performed at [facility], [address] [place], [place] [address]  Phosphorus     Status: Abnormal   Collection Time: 08/18/21  4:41 AM  Result Value Ref Range   Phosphorus 2.0 (L) 2.5 - 4.6 mg/dL    Comment: Performed at [facility], [address], [place], [place] [address]    Chemistries  Recent Labs  Lab 08/15/21 0315 08/17/21 0045 08/18/21 0441  NA 143 133* 132*  K 3.9 3.6 3.5  CL 107 95* 96*  CO2 23 17* 23  GLUCOSE 109* 121* 82  BUN 9 10 7   CREATININE 0.55 0.57 0.46  CALCIUM 8.8* 8.4* 8.8*  MG  --   --  1.5*  AST 89* 66* 49*  ALT 43 40 33  ALKPHOS 60 61 51  BILITOT 0.1* 2.1* 1.0   ------------------------------------------------------------------------------------------------------------------  ------------------------------------------------------------------------------------------------------------------ GFR: Estimated Creatinine Clearance: 74.3 mL/min (by C-G formula based on SCr of 0.46 mg/dL). Liver Function Tests: Recent Labs  Lab 08/15/21 0315 08/17/21 0045 08/18/21 0441  AST 89* 66* 49*  ALT 43 40 33  ALKPHOS 60 61 51  BILITOT 0.1* 2.1* 1.0  PROT 7.3 7.7 7.0  ALBUMIN 4.5 4.5 4.2   Recent Labs  Lab 08/17/21 0045  LIPASE 64*   No results for input(s): AMMONIA in the last 168 hours. Coagulation  Profile: No results for input(s): INR, PROTIME in the last 168 hours. Cardiac Enzymes: No results for input(s): CKTOTAL, CKMB, CKMBINDEX, TROPONINI in the last 168 hours. BNP (last 3 results) No results for input(s): PROBNP in the last 8760 hours. HbA1C: No results for input(s): HGBA1C in the last 72 hours. CBG: Recent Labs  Lab 08/17/21 0045  GLUCAP 128*   Lipid Profile: No results for input(s): CHOL, HDL, LDLCALC, TRIG, CHOLHDL, LDLDIRECT in the last 72 hours. Thyroid Function Tests: No results for input(s): TSH, T4TOTAL, FREET4, T3FREE, THYROIDAB in the last 72 hours. Anemia Panel: No results for input(s): VITAMINB12, FOLATE, FERRITIN, TIBC, IRON, RETICCTPCT in the last 72 hours.  --------------------------------------------------------------------------------------------------------------- Urine analysis:    Component Value Date/Time  COLORURINE YELLOW 08/17/2021 0135   APPEARANCEUR CLEAR 08/17/2021 0135   APPEARANCEUR Clear 12/07/2013 0133   LABSPEC 1.018 08/17/2021 0135   LABSPEC 1.006 12/07/2013 0133   PHURINE 6.0 08/17/2021 0135   GLUCOSEU NEGATIVE 08/17/2021 0135   GLUCOSEU Negative 12/07/2013 0133   HGBUR MODERATE (A) 08/17/2021 0135   BILIRUBINUR NEGATIVE 08/17/2021 0135   BILIRUBINUR Negative 12/07/2013 0133   KETONESUR 80 (A) 08/17/2021 0135   PROTEINUR 100 (A) 08/17/2021 0135   UROBILINOGEN 0.2 04/28/2015 1559   NITRITE NEGATIVE 08/17/2021 0135   LEUKOCYTESUR NEGATIVE 08/17/2021 0135   LEUKOCYTESUR Negative 12/07/2013 0133      Imaging Results:    Abd 1 View (KUB)  Result Date: 08/17/2021 CLINICAL DATA:  Abdominal pain. EXAM: ABDOMEN - 1 VIEW COMPARISON:  CT abdomen and pelvis without contrast 03/06/2021 FINDINGS: The bowel gas pattern is normal. No radio-opaque calculi or other significant radiographic abnormality are seen. The visceral shadows are stable with prior studies showing severe hepatic steatosis. L5-S1 posterior fusion rods and pedicle  screws are again shown. IMPRESSION: No acute radiographic findings or radiographic interval changes. Electronically Signed   By: Telford Nab M.D.   On: 08/17/2021 06:39       Assessment & Plan:    Principal Problem:   Alcohol withdrawal ([place]) Active Problems:   Substance abuse (H[facility])   High anion gap metabolic acidosis   Intractable nausea and vomiting   Alcohol withdrawal Patient only drinks 1/5 a day of vodka Last drink 22nd at 6 AM Patient was tremulous and felt like she was going to have a seizure at presentation Improved with CIWA Continue CIWA protocol Continue to monitor I will schedule Librium taper High anion gap metabolic acidosis Bicarb 17, gap 21 VBG pending Likely multifactorial with lactic acidosis initially 3.7, and alcoholic ketoacidosis as patient has not been eating and has been vomiting.  Urine shows ketones Continue fluids Trend in the a.m. Counseled on the importance of alcohol cessation Substance abuse UDS positive for THC Last THC use was 2 days ago Continue to monitor Intractable nausea and vomiting Continue Zofran IV N.p.o. except for sips with meds Advance diet as tolerated    DVT Prophylaxis-   Heparin - SCDs   AM Labs Ordered, also please review Full Orders  Family Communication: No family at bedside  Code Status:  FULL  Admission status: Observation  Disposition: Actively withdrawing with hallucination     Delesha Pohlman A DO           Patient ID: Anahli Arvanitis, female   DOB: 11-Oct-1975, 46 y.o.   MRN: [id number]

## 2021-08-18 NOTE — Progress Notes (Signed)
Patient is insisting to leave AMA, She states there is nothing is wrong with her. Notified MD. MD came to evaluate her. Patient signed AMA form and everything was discussed with her.

## 2021-08-18 NOTE — Progress Notes (Signed)
Sitter at bedside.

## 2021-08-18 NOTE — Progress Notes (Signed)
Patient thinks she is at home, walking around (almost fell) trying to find her cigarettes. She took her nicotine patch off and wont keep her heart monitor on. We got her to sit back in the bed and she didn't give [place] any trouble right now. CIWA 17, HR 78 BP 168/110, notified MD

## 2021-08-18 NOTE — ED Triage Notes (Signed)
Patient states recently released from here and went home and drank 1/2 beer and complains of abdominal pain, nausea and vomiting. Per patient recently dx with pancreatitis.

## 2021-08-18 NOTE — Discharge Summary (Addendum)
Admission:  08/17/2021  Discharge:  08/18/2021                                                                               HPI:    Olivia Lester  is a 46 y.o. 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  Patient reports that she has had diarrhea and been throwing up for 18 hours straight.  She reports that she has vomited so much that she has been lightheaded.  She has had muscle cramps that make her feel like she is going to have a seizure.  She reports she has had alcohol withdrawal seizures in the past.  She has associated abdominal pain that is diffuse but worst in the lower abdomen.  Feels like menstrual cramps, but patient is no longer has menstrual cycle.  Patient has associated dysuria as well.  Patient reports she is throwing up every 15-20 minutes.  Her emesis now appears as green liquid with no blood.  She reports a degree because she has been drinking green Gatorade.  Her last normal meal was on the 20th.  Patient reports that she normally does not eat on the weekends because she drinks all day.  She drinks about 1/5 of liquor per day.  Her last drink was Sunday morning.  Patient reports her last bowel movement was in the middle of last week.  She reports she only goes about once a week and has to take a laxative to make that happen.  Patient has taken Tylenol for her abdominal pain with no relief.  She reports her abdominal pain is constant.  She has not had a fever.  She does report a rash on her lower abdomen that was itchy and resolved with Benadryl.  She has had headaches as well, but reports that that is relatively normal for her.  Patient has no other complaints at this time.  Patient is a current smoker a pack a day.  She does drink alcohol.  She does use marijuana.  Her last marijuana use was 2 days ago.  She is  vaccinated for COVID.  Patient is full code.  In the ED Temp 97.9, heart rate 71 -100, respiratory rate 15-19, blood pressure 155/92, satting at 98% No leukocytosis, hemoglobin 15.0 Chemistry panel reveals a slight hyponatremia 133, decreased bicarb at 17, increased gap at 21 Lipase is 64, AST is 66, ALT is 40, T bili is 2.1 Lactic acid is initially 3.7 and then 2.2 EKG shows a heart rate 91, sinus rhythm, QTC 457 Patient was given Benadryl, fentanyl, 2 L LR, started on the CIWA protocol, given morphine and Zofran ED provider reports that at presentation patient was very shaky but has done better since CIWA Admission was requested for alcohol withdrawal and alcoholic ketoacidosis  Subjective Patient wishes to go home.  Nursing staff is reporting hallucination.  None now.  Mildly agitated.  She is not interested in getting help with sobering up   Physical Exam:   Vitals  Blood pressure (!) 168/110, pulse 78, temperature 98.3 F (36.8  C), temperature source Oral, resp. rate 20, height 5\' 6"  (1.676 m), weight 53 kg, SpO2 100 %.   1.  General: Patient lying supine in bed,  no acute distress   2. Psychiatric: Alert and oriented x 2, mood is irritable and behavior normal for situation, cooperative with exam   3. Neurologic: Speech and language are normal, face is symmetric, moves all 4 extremities voluntarily, at baseline without acute deficits on limited exam   4. HEENMT:  Head is atraumatic, normocephalic, pupils reactive to light, neck is supple, trachea is midline, mucous membranes are moist, poor dentition   5. Respiratory : Lungs are clear to auscultation bilaterally without wheezing, rhonchi, rales, no cyanosis, no increase in work of breathing or accessory muscle use   6. Cardiovascular : Heart rate slightly tachycardic, rhythm is regular, no murmurs, rubs or gallops, no peripheral edema, peripheral pulses palpated   7. Gastrointestinal:  Abdomen is soft, nondistended,  diffusely tender and worse at the epigastric region, bowel sounds active, no masses or organomegaly palpated   8. Skin:  Skin is warm, dry and intact without rashes, acute lesions, or ulcers on limited exam   9.Musculoskeletal:  No acute deformities or trauma, no asymmetry in tone, no peripheral edema, peripheral pulses palpated, no tenderness to palpation in the extremities     Data Review:    CBC Recent Labs  Lab 08/15/21 0315 08/17/21 0045 08/18/21 0441  WBC 7.8 8.3 5.6  HGB 15.0 15.0 13.5  HCT 44.3 42.9 40.5  PLT 203 163 95*  MCV 94.9 94.9 98.5  MCH 32.1 33.2 32.8  MCHC 33.9 35.0 33.3  RDW 14.3 13.9 13.5  LYMPHSABS 3.5 1.4 2.2  MONOABS 0.6 0.4 0.4  EOSABS 0.0 0.0 0.1  BASOSABS 0.1 0.0 0.0   ------------------------------------------------------------------------------------------------------------------  Results for orders placed or performed during the hospital encounter of 08/17/21 (from the past 48 hour(s))  CBG monitoring, ED     Status: Abnormal   Collection Time: 08/17/21 12:45 AM  Result Value Ref Range   Glucose-Capillary 128 (H) 70 - 99 mg/dL    Comment: Glucose reference range applies only to samples taken after fasting for at least 8 hours.  Comprehensive metabolic panel     Status: Abnormal   Collection Time: 08/17/21 12:45 AM  Result Value Ref Range   Sodium 133 (L) 135 - 145 mmol/L    Comment: DELTA CHECK NOTED   Potassium 3.6 3.5 - 5.1 mmol/L   Chloride 95 (L) 98 - 111 mmol/L   CO2 17 (L) 22 - 32 mmol/L   Glucose, Bld 121 (H) 70 - 99 mg/dL    Comment: Glucose reference range applies only to samples taken after fasting for at least 8 hours.   BUN 10 6 - 20 mg/dL   Creatinine, Ser 0.57 0.44 - 1.00 mg/dL   Calcium 8.4 (L) 8.9 - 10.3 mg/dL   Total Protein 7.7 6.5 - 8.1 g/dL   Albumin 4.5 3.5 - 5.0 g/dL   AST 66 (H) 15 - 41 U/L   ALT 40 0 - 44 U/L   Alkaline Phosphatase 61 38 - 126 U/L   Total Bilirubin 2.1 (H) 0.3 - 1.2 mg/dL   GFR, Estimated  >60 >60 mL/min    Comment: (NOTE) Calculated using the CKD-EPI Creatinine Equation (2021)    Anion gap 21 (H) 5 - 15    Comment: Performed at [facility], [address], [place], [place] [address]  Ethanol     Status: None  Collection Time: 08/17/21 12:45 AM  Result Value Ref Range   Alcohol, Ethyl (B) <10 <10 mg/dL    Comment: (NOTE) Lowest detectable limit for serum alcohol is 10 mg/dL.  For medical purposes only. Performed at [facility], [address] [place], [place] [address]   CBC with Differential     Status: None   Collection Time: 08/17/21 12:45 AM  Result Value Ref Range   WBC 8.3 4.0 - 10.5 K/uL   RBC 4.52 3.87 - 5.11 MIL/uL   Hemoglobin 15.0 12.0 - 15.0 g/dL   HCT 42.9 36.0 - 46.0 %   MCV 94.9 80.0 - 100.0 fL   MCH 33.2 26.0 - 34.0 pg   MCHC 35.0 30.0 - 36.0 g/dL   RDW 13.9 11.5 - 15.5 %   Platelets 163 150 - 400 K/uL   nRBC 0.0 0.0 - 0.2 %   Neutrophils Relative % 77 %   Neutro Abs 6.4 1.7 - 7.7 K/uL   Lymphocytes Relative 17 %   Lymphs Abs 1.4 0.7 - 4.0 K/uL   Monocytes Relative 5 %   Monocytes Absolute 0.4 0.1 - 1.0 K/uL   Eosinophils Relative 0 %   Eosinophils Absolute 0.0 0.0 - 0.5 K/uL   Basophils Relative 1 %   Basophils Absolute 0.0 0.0 - 0.1 K/uL   Immature Granulocytes 0 %   Abs Immature Granulocytes 0.01 0.00 - 0.07 K/uL    Comment: Performed at [facility], [address] [place], [place] [address]  Lipase, blood     Status: Abnormal   Collection Time: 08/17/21 12:45 AM  Result Value Ref Range   Lipase 64 (H) 11 - 51 U/L    Comment: Performed at [facility], [address], [place], [place] [address]  HIV Antibody (routine testing w rflx)     Status: None   Collection Time: 08/17/21 12:48 AM  Result Value Ref Range   HIV Screen 4th Generation wRfx Non Reactive Non Reactive    Comment: Performed at [facility], [place] [address], [place], [place] [address]  Urinalysis, Routine w reflex microscopic Urine, Clean Catch     Status:  Abnormal   Collection Time: 08/17/21  1:35 AM  Result Value Ref Range   Color, Urine YELLOW YELLOW   APPearance CLEAR CLEAR   Specific Gravity, Urine 1.018 1.005 - 1.030   pH 6.0 5.0 - 8.0   Glucose, UA NEGATIVE NEGATIVE mg/dL   Hgb urine dipstick MODERATE (A) NEGATIVE   Bilirubin Urine NEGATIVE NEGATIVE   Ketones, ur 80 (A) NEGATIVE mg/dL   Protein, ur 100 (A) NEGATIVE mg/dL   Nitrite NEGATIVE NEGATIVE   Leukocytes,Ua NEGATIVE NEGATIVE   RBC / HPF 0-5 0 - 5 RBC/hpf   WBC, UA 0-5 0 - 5 WBC/hpf   Bacteria, UA RARE (A) NONE SEEN   Squamous Epithelial / LPF 0-5 0 - 5   Mucus PRESENT     Comment: Performed at [facility], [address], [place], [place] [address]  Urine rapid drug screen (hosp performed)     Status: Abnormal   Collection Time: 08/17/21  1:35 AM  Result Value Ref Range   Opiates NONE DETECTED NONE DETECTED   Cocaine NONE DETECTED NONE DETECTED   Benzodiazepines NONE DETECTED NONE DETECTED   Amphetamines NONE DETECTED NONE DETECTED   Tetrahydrocannabinol POSITIVE (A) NONE DETECTED   Barbiturates NONE DETECTED NONE DETECTED    Comment: (NOTE) DRUG SCREEN FOR MEDICAL PURPOSES ONLY.  IF CONFIRMATION IS NEEDED  FOR ANY PURPOSE, NOTIFY LAB WITHIN 5 DAYS.  LOWEST DETECTABLE LIMITS FOR URINE DRUG SCREEN Drug Class                     Cutoff (ng/mL) Amphetamine and metabolites    1000 Barbiturate and metabolites    200 Benzodiazepine                 326 Tricyclics and metabolites     300 Opiates and metabolites        300 Cocaine and metabolites        300 THC                            50 Performed at [facility], [address], [place], [place] [address]   Lactic acid, plasma     Status: Abnormal   Collection Time: 08/17/21  1:45 AM  Result Value Ref Range   Lactic Acid, Venous 2.2 (HH) 0.5 - 1.9 mmol/L    Comment: CRITICAL RESULT CALLED TO, READ BACK BY AND VERIFIED WITH: [email or web address],G@[id number]  by [email or web address], b 1.24.23 Performed at [facility], [address],  [place], [place] [address]   Pregnancy, urine     Status: None   Collection Time: 08/17/21  1:52 AM  Result Value Ref Range   Preg Test, Ur NEGATIVE NEGATIVE    Comment:        THE SENSITIVITY OF THIS METHODOLOGY IS >20 mIU/mL. Performed at [facility], [address], [place], [place] [address]   Resp Panel by RT-PCR (Flu A&B, Covid) Nasopharyngeal Swab     Status: None   Collection Time: 08/17/21  5:06 AM   Specimen: Nasopharyngeal Swab; Nasopharyngeal(NP) swabs in vial transport medium  Result Value Ref Range   SARS Coronavirus 2 by RT PCR NEGATIVE NEGATIVE    Comment: (NOTE) SARS-CoV-2 target nucleic acids are NOT DETECTED.  The SARS-CoV-2 RNA is generally detectable in upper respiratory specimens during the acute phase of infection. The lowest concentration of SARS-CoV-2 viral copies this assay can detect is 138 copies/mL. A negative result does not preclude SARS-Cov-2 infection and should not be used as the sole basis for treatment or other patient management decisions. A negative result may occur with  improper specimen collection/handling, submission of specimen other than nasopharyngeal swab, presence of viral mutation(s) within the areas targeted by this assay, and inadequate number of viral copies(<138 copies/mL). A negative result must be combined with clinical observations, patient history, and epidemiological information. The expected result is Negative.  Fact Sheet for Patients:  EntrepreneurPulse.com.au  Fact Sheet for Healthcare Providers:  IncredibleEmployment.be  This test is no t yet approved or cleared by the [place] FDA and  has been authorized for detection and/or diagnosis of SARS-CoV-2 by FDA under an Emergency Use Authorization (EUA). This EUA will remain  in effect (meaning this test can be used) for the duration of the COVID-19 declaration under Section 564(b)(1) of the Act, 21 U.S.C.section 360bbb-3(b)(1),  unless the authorization is terminated  or revoked sooner.       Influenza A by PCR NEGATIVE NEGATIVE   Influenza B by PCR NEGATIVE NEGATIVE    Comment: (NOTE) The Xpert Xpress SARS-CoV-2/FLU/RSV plus assay is intended as an aid in the diagnosis of influenza from Nasopharyngeal swab specimens and should not be used as a sole basis for treatment. Nasal washings and aspirates are unacceptable for Xpert Xpress SARS-CoV-2/FLU/RSV testing.  Fact Sheet for Patients:  EntrepreneurPulse.com.au  Fact Sheet for Healthcare Providers: IncredibleEmployment.be  This test is not yet approved or cleared by the [place] FDA and has been authorized for detection and/or diagnosis of SARS-CoV-2 by FDA under an Emergency Use Authorization (EUA). This EUA will remain in effect (meaning this test can be used) for the duration of the COVID-19 declaration under Section 564(b)(1) of the Act, 21 U.S.C. section 360bbb-3(b)(1), unless the authorization is terminated or revoked.  Performed at [facility], [address], [place], [place] [address]   Blood gas, venous     Status: Abnormal   Collection Time: 08/17/21  5:43 AM  Result Value Ref Range   FIO2 21.00    pH, Ven 7.423 7.250 - 7.430   pCO2, Ven 39.6 (L) 44.0 - 60.0 mmHg   pO2, Ven 76.1 (H) 32.0 - 45.0 mmHg   Bicarbonate 25.6 20.0 - 28.0 mmol/L   Acid-Base Excess 1.5 0.0 - 2.0 mmol/L   O2 Saturation 94.9 %   Patient temperature 36.6    Drawn by [id number]     Comment: Performed at [facility], [address], [place], [place] [address]  Lactic acid, plasma     Status: None   Collection Time: 08/17/21  5:44 AM  Result Value Ref Range   Lactic Acid, Venous 0.8 0.5 - 1.9 mmol/L    Comment: Performed at [facility], [address], [place], [place] [address]  MRSA Next Gen by PCR, Nasal     Status: None   Collection Time: 08/17/21  9:04 AM   Specimen: Nasal Mucosa; Nasal Swab  Result Value Ref Range   MRSA by  PCR Next Gen NOT DETECTED NOT DETECTED    Comment: (NOTE) The GeneXpert MRSA Assay (FDA approved for NASAL specimens only), is one component of a comprehensive MRSA colonization surveillance program. It is not intended to diagnose MRSA infection nor to guide or monitor treatment for MRSA infections. Test performance is not FDA approved in patients less than 58 years old. Performed at [facility], [address] [place], [place] [address]   Comprehensive metabolic panel     Status: Abnormal   Collection Time: 08/18/21  4:41 AM  Result Value Ref Range   Sodium 132 (L) 135 - 145 mmol/L   Potassium 3.5 3.5 - 5.1 mmol/L   Chloride 96 (L) 98 - 111 mmol/L   CO2 23 22 - 32 mmol/L   Glucose, Bld 82 70 - 99 mg/dL    Comment: Glucose reference range applies only to samples taken after fasting for at least 8 hours.   BUN 7 6 - 20 mg/dL   Creatinine, Ser 0.46 0.44 - 1.00 mg/dL   Calcium 8.8 (L) 8.9 - 10.3 mg/dL   Total Protein 7.0 6.5 - 8.1 g/dL   Albumin 4.2 3.5 - 5.0 g/dL   AST 49 (H) 15 - 41 U/L   ALT 33 0 - 44 U/L   Alkaline Phosphatase 51 38 - 126 U/L   Total Bilirubin 1.0 0.3 - 1.2 mg/dL   GFR, Estimated >60 >60 mL/min    Comment: (NOTE) Calculated using the CKD-EPI Creatinine Equation (2021)    Anion gap 13 5 - 15    Comment: Performed at [facility], [address] [place],  [address]  CBC WITH DIFFERENTIAL     Status: Abnormal   Collection Time: 08/18/21  4:41 AM  Result Value Ref Range   WBC 5.6 4.0 - 10.5 K/uL   RBC 4.11 3.87 - 5.11 MIL/uL  Hemoglobin 13.5 12.0 - 15.0 g/dL   HCT 40.5 36.0 - 46.0 %   MCV 98.5 80.0 - 100.0 fL   MCH 32.8 26.0 - 34.0 pg   MCHC 33.3 30.0 - 36.0 g/dL   RDW 13.5 11.5 - 15.5 %   Platelets 95 (L) 150 - 400 K/uL    Comment: SPECIMEN CHECKED FOR CLOTS Immature Platelet Fraction may be clinically indicated, consider ordering this additional test [id number] DELTA CHECK NOTED PLATELET COUNT CONFIRMED BY SMEAR    nRBC 0.0 0.0 - 0.2 %    Neutrophils Relative % 51 %   Neutro Abs 2.9 1.7 - 7.7 K/uL   Lymphocytes Relative 40 %   Lymphs Abs 2.2 0.7 - 4.0 K/uL   Monocytes Relative 7 %   Monocytes Absolute 0.4 0.1 - 1.0 K/uL   Eosinophils Relative 2 %   Eosinophils Absolute 0.1 0.0 - 0.5 K/uL   Basophils Relative 0 %   Basophils Absolute 0.0 0.0 - 0.1 K/uL   WBC Morphology MORPHOLOGY UNREMARKABLE    RBC Morphology MORPHOLOGY UNREMARKABLE    Immature Granulocytes 0 %   Abs Immature Granulocytes 0.01 0.00 - 0.07 K/uL    Comment: Performed at [facility], [address] [place], [place] [address]  Magnesium     Status: Abnormal   Collection Time: 08/18/21  4:41 AM  Result Value Ref Range   Magnesium 1.5 (L) 1.7 - 2.4 mg/dL    Comment: Performed at [facility], [address], [place], [place] [address]  Phosphorus     Status: Abnormal   Collection Time: 08/18/21  4:41 AM  Result Value Ref Range   Phosphorus 2.0 (L) 2.5 - 4.6 mg/dL    Comment: Performed at [facility], [address], [place], [place] [address]    Chemistries  Recent Labs  Lab 08/15/21 0315 08/17/21 0045 08/18/21 0441  NA 143 133* 132*  K 3.9 3.6 3.5  CL 107 95* 96*  CO2 23 17* 23  GLUCOSE 109* 121* 82  BUN 9 10 7   CREATININE 0.55 0.57 0.46  CALCIUM 8.8* 8.4* 8.8*  MG  --   --  1.5*  AST 89* 66* 49*  ALT 43 40 33  ALKPHOS 60 61 51  BILITOT 0.1* 2.1* 1.0   ------------------------------------------------------------------------------------------------------------------  ------------------------------------------------------------------------------------------------------------------ GFR: Estimated Creatinine Clearance: 74.3 mL/min (by C-G formula based on SCr of 0.46 mg/dL). Liver Function Tests: Recent Labs  Lab 08/15/21 0315 08/17/21 0045 08/18/21 0441  AST 89* 66* 49*  ALT 43 40 33  ALKPHOS 60 61 51  BILITOT 0.1* 2.1* 1.0  PROT 7.3 7.7 7.0  ALBUMIN 4.5 4.5 4.2   Recent Labs  Lab 08/17/21 0045  LIPASE 64*   No results for  input(s): AMMONIA in the last 168 hours. Coagulation Profile: No results for input(s): INR, PROTIME in the last 168 hours. Cardiac Enzymes: No results for input(s): CKTOTAL, CKMB, CKMBINDEX, TROPONINI in the last 168 hours. BNP (last 3 results) No results for input(s): PROBNP in the last 8760 hours. HbA1C: No results for input(s): HGBA1C in the last 72 hours. CBG: Recent Labs  Lab 08/17/21 0045  GLUCAP 128*   Lipid Profile: No results for input(s): CHOL, HDL, LDLCALC, TRIG, CHOLHDL, LDLDIRECT in the last 72 hours. Thyroid Function Tests: No results for input(s): TSH, T4TOTAL, FREET4, T3FREE, THYROIDAB in the last 72 hours. Anemia Panel: No results for input(s): VITAMINB12, FOLATE, FERRITIN, TIBC, IRON, RETICCTPCT in the last 72 hours.  --------------------------------------------------------------------------------------------------------------- Urine analysis:    Component  Value Date/Time   COLORURINE YELLOW 08/17/2021 0135   APPEARANCEUR CLEAR 08/17/2021 0135   APPEARANCEUR Clear 12/07/2013 0133   LABSPEC 1.018 08/17/2021 0135   LABSPEC 1.006 12/07/2013 0133   PHURINE 6.0 08/17/2021 0135   GLUCOSEU NEGATIVE 08/17/2021 0135   GLUCOSEU Negative 12/07/2013 0133   HGBUR MODERATE (A) 08/17/2021 0135   BILIRUBINUR NEGATIVE 08/17/2021 0135   BILIRUBINUR Negative 12/07/2013 0133   KETONESUR 80 (A) 08/17/2021 0135   PROTEINUR 100 (A) 08/17/2021 0135   UROBILINOGEN 0.2 04/28/2015 1559   NITRITE NEGATIVE 08/17/2021 0135   LEUKOCYTESUR NEGATIVE 08/17/2021 0135   LEUKOCYTESUR Negative 12/07/2013 0133      Imaging Results:    Abd 1 View (KUB)  Result Date: 08/17/2021 CLINICAL DATA:  Abdominal pain. EXAM: ABDOMEN - 1 VIEW COMPARISON:  CT abdomen and pelvis without contrast 03/06/2021 FINDINGS: The bowel gas pattern is normal. No radio-opaque calculi or other significant radiographic abnormality are seen. The visceral shadows are stable with prior studies showing severe hepatic  steatosis. L5-S1 posterior fusion rods and pedicle screws are again shown. IMPRESSION: No acute radiographic findings or radiographic interval changes. Electronically Signed   By: Telford Nab M.D.   On: 08/17/2021 06:39       Assessment & Plan:    Principal Problem:   Alcohol withdrawal ([place]) Active Problems:   Substance abuse ([facility])   High anion gap metabolic acidosis   Intractable nausea and vomiting   Alcohol withdrawal Patient only drinks 1/5 a day of vodka Last drink 22nd at 6 AM Patient was tremulous and felt like she was going to have a seizure at presentation Improved with CIWA Continue CIWA protocol Continue to monitor I will schedule Librium taper High anion gap metabolic acidosis Bicarb 17, gap 21 VBG pending Likely multifactorial with lactic acidosis initially 3.7, and alcoholic ketoacidosis as patient has not been eating and has been vomiting.  Urine shows ketones Continue fluids Trend in the a.m. Counseled on the importance of alcohol cessation Substance abuse UDS positive for THC Last THC use was 2 days ago Continue to monitor Intractable nausea and vomiting Continue Zofran IV N.p.o. except for sips with meds Advance diet as tolerated    DVT Prophylaxis-   Heparin - SCDs   AM Labs Ordered, also please review Full Orders  Family Communication: No family at bedside  Code Status:  FULL  Admission status: Observation  Disposition: Actively withdrawing with hallucination     Bailee Thall A DO    Patient alert and oriented x4 and left AMA       Patient ID: [name], female   DOB: [birth year], 46 y.o.   MRN: [id number]

## 2021-08-18 NOTE — Progress Notes (Signed)
Per MD to give additional 1mg  of Ativan

## 2021-08-18 NOTE — Discharge Instructions (Addendum)
As discussed it is very important that you pick up your prescription which was previously sent to your pharmacy, take it as directed.  Return here for concerning changes in your condition.

## 2021-08-18 NOTE — ED Notes (Signed)
Pt complains of pain 8/10, nausea and anxiousness. Dr notified

## 2021-08-18 NOTE — ED Notes (Signed)
Pt smoking in treatment room. Found ashes and butt of cigarette in sink.

## 2021-08-18 NOTE — ED Notes (Signed)
Pt called out to use the restroom, room smelled of cigarette smoke. Pt states that she lit a match in room to get rid of smell but cigarette ashes were noticed in sink. Pt informed that no matches can be lit in hospital and no cigarettes can be smoked in hospital. Cigarettes and lighter obtained and given to security.

## 2021-09-06 ENCOUNTER — Encounter (HOSPITAL_COMMUNITY): Payer: Self-pay

## 2021-09-06 ENCOUNTER — Inpatient Hospital Stay (HOSPITAL_COMMUNITY)
Admission: EM | Admit: 2021-09-06 | Discharge: 2021-09-08 | DRG: 897 | Disposition: A | Discharge status: Home / Self Care | Payer: Self-pay | Attending: Family Medicine | Admitting: Family Medicine

## 2021-09-06 ENCOUNTER — Other Ambulatory Visit: Payer: Self-pay

## 2021-09-06 DIAGNOSIS — E860 Dehydration: Secondary | ICD-10-CM | POA: Diagnosis present

## 2021-09-06 DIAGNOSIS — K259 Gastric ulcer, unspecified as acute or chronic, without hemorrhage or perforation: Secondary | ICD-10-CM | POA: Diagnosis present

## 2021-09-06 DIAGNOSIS — Z87442 Personal history of urinary calculi: Secondary | ICD-10-CM

## 2021-09-06 DIAGNOSIS — Z886 Allergy status to analgesic agent status: Secondary | ICD-10-CM

## 2021-09-06 DIAGNOSIS — B1920 Unspecified viral hepatitis C without hepatic coma: Secondary | ICD-10-CM | POA: Diagnosis present

## 2021-09-06 DIAGNOSIS — F10939 Alcohol use, unspecified with withdrawal, unspecified: Secondary | ICD-10-CM | POA: Diagnosis present

## 2021-09-06 DIAGNOSIS — I10 Essential (primary) hypertension: Secondary | ICD-10-CM | POA: Diagnosis present

## 2021-09-06 DIAGNOSIS — Z811 Family history of alcohol abuse and dependence: Secondary | ICD-10-CM

## 2021-09-06 DIAGNOSIS — Z20822 Contact with and (suspected) exposure to covid-19: Secondary | ICD-10-CM | POA: Diagnosis present

## 2021-09-06 DIAGNOSIS — Z8249 Family history of ischemic heart disease and other diseases of the circulatory system: Secondary | ICD-10-CM

## 2021-09-06 DIAGNOSIS — R112 Nausea with vomiting, unspecified: Secondary | ICD-10-CM | POA: Diagnosis present

## 2021-09-06 DIAGNOSIS — E8729 Other acidosis: Principal | ICD-10-CM | POA: Diagnosis present

## 2021-09-06 DIAGNOSIS — Z8711 Personal history of peptic ulcer disease: Secondary | ICD-10-CM

## 2021-09-06 DIAGNOSIS — Z888 Allergy status to other drugs, medicaments and biological substances status: Secondary | ICD-10-CM

## 2021-09-06 DIAGNOSIS — F410 Panic disorder [episodic paroxysmal anxiety] without agoraphobia: Secondary | ICD-10-CM | POA: Diagnosis present

## 2021-09-06 DIAGNOSIS — Z79899 Other long term (current) drug therapy: Secondary | ICD-10-CM

## 2021-09-06 DIAGNOSIS — F10930 Alcohol use, unspecified with withdrawal, uncomplicated: Secondary | ICD-10-CM

## 2021-09-06 DIAGNOSIS — E876 Hypokalemia: Secondary | ICD-10-CM | POA: Diagnosis present

## 2021-09-06 DIAGNOSIS — Z809 Family history of malignant neoplasm, unspecified: Secondary | ICD-10-CM

## 2021-09-06 DIAGNOSIS — F1310 Sedative, hypnotic or anxiolytic abuse, uncomplicated: Secondary | ICD-10-CM | POA: Diagnosis present

## 2021-09-06 DIAGNOSIS — F17210 Nicotine dependence, cigarettes, uncomplicated: Secondary | ICD-10-CM | POA: Diagnosis present

## 2021-09-06 DIAGNOSIS — F10230 Alcohol dependence with withdrawal, uncomplicated: Principal | ICD-10-CM | POA: Diagnosis present

## 2021-09-06 DIAGNOSIS — Z823 Family history of stroke: Secondary | ICD-10-CM

## 2021-09-06 LAB — URINALYSIS, ROUTINE W REFLEX MICROSCOPIC
Bacteria, UA: NONE SEEN
Bilirubin Urine: NEGATIVE
Glucose, UA: NEGATIVE mg/dL
Hgb urine dipstick: NEGATIVE
Ketones, ur: 80 mg/dL — AB
Leukocytes,Ua: NEGATIVE
Nitrite: NEGATIVE
Protein, ur: 100 mg/dL — AB
Specific Gravity, Urine: 1.024 (ref 1.005–1.030)
pH: 6 (ref 5.0–8.0)

## 2021-09-06 LAB — COMPREHENSIVE METABOLIC PANEL
ALT: 41 U/L (ref 0–44)
AST: 49 U/L — ABNORMAL HIGH (ref 15–41)
Albumin: 5 g/dL (ref 3.5–5.0)
Alkaline Phosphatase: 57 U/L (ref 38–126)
Anion gap: 27 — ABNORMAL HIGH (ref 5–15)
BUN: 16 mg/dL (ref 6–20)
CO2: 18 mmol/L — ABNORMAL LOW (ref 22–32)
Calcium: 10 mg/dL (ref 8.9–10.3)
Chloride: 94 mmol/L — ABNORMAL LOW (ref 98–111)
Creatinine, Ser: 0.61 mg/dL (ref 0.44–1.00)
GFR, Estimated: >60 mL/min (ref >60)
Glucose, Bld: 109 mg/dL — ABNORMAL HIGH (ref 70–99)
Potassium: 3.1 mmol/L — ABNORMAL LOW (ref 3.5–5.1)
Sodium: 139 mmol/L (ref 135–145)
Total Bilirubin: 1.5 mg/dL — ABNORMAL HIGH (ref 0.3–1.2)
Total Protein: 8.3 g/dL — ABNORMAL HIGH (ref 6.5–8.1)

## 2021-09-06 LAB — CBC
HCT: 44.2 % (ref 36.0–46.0)
Hemoglobin: 14.8 g/dL (ref 12.0–15.0)
MCH: 32.2 pg (ref 26.0–34.0)
MCHC: 33.5 g/dL (ref 30.0–36.0)
MCV: 96.3 fL (ref 80.0–100.0)
Platelets: 253 10*3/uL (ref 150–400)
RBC: 4.59 MIL/uL (ref 3.87–5.11)
RDW: 14.2 % (ref 11.5–15.5)
WBC: 8.7 10*3/uL (ref 4.0–10.5)
nRBC: 0 % (ref 0.0–0.2)

## 2021-09-06 LAB — LIPASE, BLOOD: Lipase: 23 U/L (ref 11–51)

## 2021-09-06 LAB — POC URINE PREG, ED: Preg Test, Ur: NEGATIVE

## 2021-09-06 MED ORDER — LORAZEPAM 1 MG PO TABS: 0.0000 mg | ORAL_TABLET | Freq: Two times a day (BID) | ORAL | Status: DC

## 2021-09-06 MED ORDER — LORAZEPAM 2 MG/ML IJ SOLN: 0.0000 mg | Freq: Two times a day (BID) | INTRAMUSCULAR | Status: DC

## 2021-09-06 MED ORDER — THIAMINE HCL 100 MG/ML IJ SOLN
100.0000 mg | Freq: Every day | INTRAMUSCULAR | Status: DC
  Filled 2021-09-06: qty 2

## 2021-09-06 MED ORDER — ONDANSETRON HCL 4 MG/2ML IJ SOLN
4.0000 mg | Freq: Once | INTRAMUSCULAR | Status: AC
  Administered 2021-09-07: 4 mg via INTRAVENOUS
  Filled 2021-09-06: qty 2

## 2021-09-06 MED ORDER — THIAMINE HCL 100 MG PO TABS
100.0000 mg | ORAL_TABLET | Freq: Every day | ORAL | Status: DC
  Administered 2021-09-07 – 2021-09-08 (×2): 100 mg via ORAL
  Filled 2021-09-06 (×2): qty 1

## 2021-09-06 MED ORDER — LORAZEPAM 1 MG PO TABS
0.0000 mg | ORAL_TABLET | Freq: Four times a day (QID) | ORAL | Status: DC
  Administered 2021-09-07: 2 mg via ORAL
  Filled 2021-09-06 (×2): qty 1

## 2021-09-06 MED ORDER — LORAZEPAM 2 MG/ML IJ SOLN
0.0000 mg | Freq: Four times a day (QID) | INTRAMUSCULAR | Status: DC
  Administered 2021-09-07 – 2021-09-08 (×6): 2 mg via INTRAVENOUS
  Filled 2021-09-06 (×6): qty 1

## 2021-09-06 MED ORDER — SODIUM CHLORIDE 0.9 % IV BOLUS
1000.0000 mL | Freq: Once | INTRAVENOUS | Status: AC
  Administered 2021-09-07: 1000 mL via INTRAVENOUS

## 2021-09-06 NOTE — ED Triage Notes (Addendum)
Pt c/o emesis that started yesterday, states she is a heavy drinker and her last drink was about 6pm yesterday.    EMS gave 4mg  of zofran in route

## 2021-09-06 NOTE — ED Provider Notes (Signed)
[facility] Provider Note   CSN: [id number] Arrival date & time: 09/06/21  2134     History  Chief Complaint  Patient presents with   Emesis    Olivia Lester is a 46 y.o. female.  HPI     This is a 46 year old female with a history of alcohol abuse who presents with abdominal pain, nausea, vomiting.  Patient reports that her last drink was on Sunday night around 6 PM.  She normally drinks around 1/5 of liquor daily.  She states she woke up vomiting on Monday at 4 AM.  Since that time she has been unable to keep anything down.  She reports epigastric pain and nausea.  She reports increasing anxiety.  She also has a history of pancreatitis.  No change in bowels.  Home Medications Prior to Admission medications   Medication Sig Start Date End Date Taking? Authorizing Provider  amitriptyline (ELAVIL) 50 MG tablet Take 50 mg by mouth at bedtime. Patient not taking: Reported on 08/17/2021 03/24/21   [provider]  amLODipine (NORVASC) 5 MG tablet Take 1 tablet (5 mg total) by mouth daily. Patient not taking: Reported on 10/01/2020 10/28/19   Roxan Hockey, MD  chlordiazePOXIDE (LIBRIUM) 25 MG capsule 50mg  PO TID x 1D, then 25-50mg  PO BID X 1D, then 25-50mg  PO QD X 1D Patient not taking: Reported on 10/31/8117 14/78/29   Delora Fuel, MD  folic acid (FOLVITE) 1 MG tablet Take 1 tablet (1 mg total) by mouth daily. Patient not taking: Reported on 08/17/2021 10/29/19   Roxan Hockey, MD  lidocaine (LIDODERM) 5 % Place 1 patch onto the skin daily. Remove & Discard patch within 12 hours or as directed by MD Patient not taking: Reported on 08/17/2021 07/12/20   Heath Lark D, DO  lipase/protease/amylase 24000-76000 units CPEP Take 1 capsule (24,000 Units total) by mouth 3 (three) times daily before meals. Patient not taking: Reported on 01/07/2020 10/29/19   Roxan Hockey, MD  magnesium 30 MG tablet Take 1 tablet (30 mg total) by mouth 2 (two) times daily. Patient  not taking: Reported on 5/62/1308 65/78/46   Delora Fuel, MD  methocarbamol (ROBAXIN) 500 MG tablet Take 1 tablet (500 mg total) by mouth every 8 (eight) hours as needed for muscle spasms. Patient not taking: Reported on 08/17/2021 07/11/20   Heath Lark D, DO  Multiple Vitamin (MULTIVITAMIN WITH MINERALS) TABS tablet Take 1 tablet by mouth every other day.  Patient not taking: Reported on 01/07/2020    [provider]  pantoprazole (PROTONIX) 40 MG tablet Take 1 tablet (40 mg total) by mouth daily. 06/03/20 09/01/20  Harvest Dark, MD  potassium chloride SA (KLOR-CON) 20 MEQ tablet Take 1 tablet (20 mEq total) by mouth 2 (two) times daily. Patient not taking: Reported on 9/62/9528 41/32/44   Delora Fuel, MD  promethazine (PHENERGAN) 25 MG tablet Take 1 tablet (25 mg total) by mouth every 6 (six) hours as needed for nausea or vomiting. Patient not taking: Reported on 08/17/2021 08/10/20   Veryl Speak, MD      Allergies    Toradol [ketorolac tromethamine] and Diona Fanti [aspirin]    Review of Systems   Review of Systems  Constitutional:  Negative for fever.  Gastrointestinal:  Positive for abdominal pain, nausea and vomiting. Negative for constipation and diarrhea.  All other systems reviewed and are negative.  Physical Exam Updated Vital Signs BP (!) 144/92    Pulse 91    Temp 98.1 F (36.7 C) (  Oral)    Resp 15    Ht 1.676 m (5\' 6" )    Wt 53 kg    SpO2 96%    BMI 18.86 kg/m  Physical Exam Vitals and nursing note reviewed.  Constitutional:      Appearance: She is well-developed.     Comments: Ill-appearing and anxious  HENT:     Head: Normocephalic and atraumatic.     Mouth/Throat:     Mouth: Mucous membranes are dry.  Eyes:     Pupils: Pupils are equal, round, and reactive to light.  Cardiovascular:     Rate and Rhythm: Regular rhythm. Tachycardia present.     Heart sounds: Normal heart sounds.  Pulmonary:     Effort: Pulmonary effort is normal. No respiratory distress.      Breath sounds: No wheezing.  Abdominal:     General: Bowel sounds are normal.     Palpations: Abdomen is soft.     Tenderness: There is abdominal tenderness.  Musculoskeletal:     Cervical back: Neck supple.     Right lower leg: No edema.     Left lower leg: No edema.  Skin:    General: Skin is warm and dry.  Neurological:     Mental Status: She is alert and oriented to person, place, and time.     Comments: Generally tremulous  Psychiatric:     Comments: Anxious    ED Results / Procedures / Treatments   Labs (all labs ordered are listed, but only abnormal results are displayed) Labs Reviewed  COMPREHENSIVE METABOLIC PANEL - Abnormal; Notable for the following components:      Result Value   Potassium 3.1 (*)    Chloride 94 (*)    CO2 18 (*)    Glucose, Bld 109 (*)    Total Protein 8.3 (*)    AST 49 (*)    Total Bilirubin 1.5 (*)    Anion gap 27 (*)    All other components within normal limits  URINALYSIS, ROUTINE W REFLEX MICROSCOPIC - Abnormal; Notable for the following components:   Ketones, ur 80 (*)    Protein, ur 100 (*)    All other components within normal limits  RESP PANEL BY RT-PCR (FLU A&B, COVID) ARPGX2  LIPASE, BLOOD  CBC  POC URINE PREG, ED    EKG None  Radiology CT ABDOMEN PELVIS W CONTRAST  Result Date: 09/07/2021 CLINICAL DATA:  Abdominal pain, acute, nonlocalized. Pt c/o emesis that started yesterday, states she is a heavy drinker and her last drink was about 6pm yesterday. EXAM: CT ABDOMEN AND PELVIS WITH CONTRAST TECHNIQUE: Multidetector CT imaging of the abdomen and pelvis was performed using the standard protocol following bolus administration of intravenous contrast. RADIATION DOSE REDUCTION: This exam was performed according to the departmental dose-optimization program which includes automated exposure control, adjustment of the mA and/or kV according to patient size and/or use of iterative reconstruction technique. CONTRAST:  74mL  OMNIPAQUE IOHEXOL 300 MG/ML  SOLN COMPARISON:  None. FINDINGS: Lower chest: No acute abnormality. Hepatobiliary: The hepatic parenchyma is diffusely hypodense compared to the splenic parenchyma consistent with fatty infiltration. No focal liver abnormality. No gallstones, gallbladder wall thickening, or pericholecystic fluid. No biliary dilatation. Pancreas: Diffusely atrophic. No focal lesion. Otherwise normal pancreatic contour. No surrounding inflammatory changes. No main pancreatic ductal dilatation. Spleen: Normal in size without focal abnormality. Adrenals/Urinary Tract: No adrenal nodule bilaterally. Bilateral kidneys enhance symmetrically. No hydronephrosis. No hydroureter. The urinary bladder is unremarkable. Stomach/Bowel:  Stomach is within normal limits. No evidence of bowel wall thickening or dilatation. Appendix appears normal. Vascular/Lymphatic: No abdominal aorta or iliac aneurysm. Severe atherosclerotic plaque of the aorta and its branches. No abdominal, pelvic, or inguinal lymphadenopathy. Reproductive: A fat containing 2.3 x 1.8 cm right adnexal lesion. Uterus and the left adnexa. Other: No intraperitoneal free fluid. No intraperitoneal free gas. No organized fluid collection. Musculoskeletal: No abdominal wall hernia or abnormality. No suspicious lytic or blastic osseous lesions. No acute displaced fracture. L5-S1 posterolateral and interbody fusion. IMPRESSION: 1. 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 2. Hepatic steatosis. 3. Severe aortic Atherosclerosis (ICD10-I70.0). 4. No acute intra-abdominal or intrapelvic abnormality. Electronically Signed   By: Iven Finn M.D.   On: 09/07/2021 01:41    Procedures .Critical Care Performed by: Merryl Hacker, MD Authorized by: Merryl Hacker, MD   Critical care provider statement:    Critical care time (minutes):  45   Critical care was time spent personally by me on the  following activities:  Development of treatment plan with patient or surrogate, discussions with consultants, evaluation of patient's response to treatment, examination of patient, ordering and review of laboratory studies, ordering and review of radiographic studies, ordering and performing treatments and interventions, pulse oximetry, re-evaluation of patient's condition and review of old charts    Medications Ordered in ED Medications  LORazepam (ATIVAN) injection 0-4 mg (2 mg Intravenous Given 09/07/21 0037)    Or  LORazepam (ATIVAN) tablet 0-4 mg ( Oral See Alternative 09/07/21 0037)  LORazepam (ATIVAN) injection 0-4 mg (has no administration in time range)    Or  LORazepam (ATIVAN) tablet 0-4 mg (has no administration in time range)  thiamine tablet 100 mg (has no administration in time range)    Or  thiamine (B-1) injection 100 mg (has no administration in time range)  sodium chloride 0.9 % bolus 1,000 mL (has no administration in time range)  pantoprazole (PROTONIX) injection 40 mg (has no administration in time range)  sodium chloride 0.9 % bolus 1,000 mL (1,000 mLs Intravenous New Bag/Given 09/07/21 0030)  ondansetron (ZOFRAN) injection 4 mg (4 mg Intravenous Given 09/07/21 0030)  iohexol (OMNIPAQUE) 300 MG/ML solution 75 mL (75 mLs Intravenous Contrast Given 09/07/21 0123)    ED Course/ Medical Decision Making/ A&P                           Medical Decision Making Amount and/or Complexity of Data Reviewed Labs: ordered. Radiology: ordered.  Risk OTC drugs. Prescription drug management. Decision regarding hospitalization.   This patient presents to the ED for concern of abdominal pain, vomiting, alcohol withdrawal, this involves an extensive number of treatment options, and is a complaint that carries with it a high risk of complications and morbidity.  The differential diagnosis includes gastritis, gastroenteritis, pancreatitis, alcohol withdrawal  MDM:    This is a  46 year old female who presents with abdominal pain, vomiting, signs and symptoms of alcohol withdrawal.  She normally drinks daily.  Last drink 24 hours ago.  Has had vomiting.  Would be at high risk for ulcers and gastritis as well as pancreatitis.  She is tremulous on exam and tachycardic.  She is very anxious appearing.  She was given fluids and started on CIWA protocol.  Initial CIWA was 12.  She was given Ativan.  Labs obtained.  She has evidence of significant dehydration with 80 ketones in  the urine.  She has mildly low potassium.  Anion gap is 27.  No history of diabetes.  Suspect AKA.  She was given 2 L of fluid and IV Protonix.  I did obtain a CT scan to rule out any intra-abdominal process.  CT scan only shows an incidental ovarian lesion which is not likely the source of her pain.  Given high CIWA score, history of withdrawal seizures and evidence of AKA, will admit to the hospitalist. (Labs, imaging)  Labs: I Ordered, and personally interpreted labs.  The pertinent results include: Anion gap, hypochloremia, hypokalemia  Imaging Studies ordered: I ordered imaging studies including CT scan I independently visualized and interpreted imaging. I agree with the radiologist interpretation  Additional history obtained from chart review.  External records from outside source obtained and reviewed including prior visits  Critical Interventions: IV fluids, IV Ativan  Consultations: I requested consultation with the hospitalist,  and discussed lab and imaging findings as well as pertinent plan - they recommend: Admission  Cardiac Monitoring: The patient was maintained on a cardiac monitor.  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   Pancreatitis, acute    Panic attack    Pneumonia    Polysubstance abuse (HCC)    PONV (postoperative nausea and vomiting)    Seizures ([place])      Medicines Meds ordered this encounter  Medications   OR Linked Order Group    LORazepam (ATIVAN) injection 0-4 mg     Order Specific Question:   CIWA-AR < 5 =     Answer:   0 mg     Order Specific Question:   CIWA-AR 5 -10 =     Answer:   1 mg     Order Specific Question:   CIWA-AR 11 -15 =     Answer:   2 mg     Order Specific Question:   CIWA-AR 16 -24 =     Answer:   2 mg     Order Specific Question:   CIWA-AR 16 -24 =     Answer:   Recheck CIWA-AR in 1 hour; if > 15 notify MD     Order Specific Question:   CIWA-AR > 24 =     Answer:   4 mg     Order Specific Question:   CIWA-AR > 24 =     Answer:   Call Rapid Response    LORazepam (ATIVAN) tablet 0-4 mg     Order Specific Question:   CIWA-AR < 5 =     Answer:   0 mg     Order Specific Question:   CIWA-AR 5 -10 =     Answer:   1 mg     Order Specific Question:   CIWA-AR 11 -15 =     Answer:   2 mg     Order Specific Question:   CIWA-AR  16 -24 =     Answer:   2 mg     Order Specific Question:   CIWA-AR 16 -24 =     Answer:   Recheck CIWA-AR in 1 hour; if > 15 notify MD     Order Specific Question:   CIWA-AR > 24 =     Answer:   4 mg     Order Specific Question:   CIWA-AR > 24 =     Answer:   Call Rapid Response   OR Linked Order Group    LORazepam (ATIVAN) injection 0-4 mg     Order Specific Question:   CIWA-AR < 5 =     Answer:   0 mg     Order Specific Question:   CIWA-AR 5 -10 =     Answer:   1 mg     Order Specific Question:   CIWA-AR 11 -15 =      Answer:   2 mg     Order Specific Question:   CIWA-AR 16 -24 =     Answer:   2 mg     Order Specific Question:   CIWA-AR 16 -24 =     Answer:   Recheck CIWA-AR in 1 hour; if > 15 notify MD     Order Specific Question:   CIWA-AR > 24 =     Answer:   4 mg     Order Specific Question:   CIWA-AR > 24 =     Answer:   Call Rapid Response    LORazepam (ATIVAN) tablet 0-4 mg     Order Specific Question:   CIWA-AR < 5 =     Answer:   0 mg     Order Specific Question:   CIWA-AR 5 -10 =     Answer:   1 mg     Order Specific Question:   CIWA-AR 11 -15 =     Answer:   2 mg     Order Specific Question:   CIWA-AR 16 -24 =     Answer:   2 mg     Order Specific Question:   CIWA-AR 16 -24 =     Answer:   Recheck CIWA-AR in 1 hour; if > 15 notify MD     Order Specific Question:   CIWA-AR > 24 =     Answer:   4 mg     Order Specific Question:   CIWA-AR > 24 =     Answer:   Call Rapid Response   OR Linked Order Group    thiamine tablet 100 mg    thiamine (B-1) injection 100 mg   sodium chloride 0.9 % bolus 1,000 mL   ondansetron (ZOFRAN) injection 4 mg   iohexol (OMNIPAQUE) 300 MG/ML solution 75 mL   sodium chloride 0.9 % bolus 1,000 mL   pantoprazole (PROTONIX) injection 40 mg    I have reviewed the patients home medicines and have made adjustments as needed  Problem List / ED Course: Problem List Items Addressed This Visit       Nervous and Auditory   Alcohol withdrawal ([place])   Other Visit Diagnoses     Alcoholic ketoacidosis    -  Primary                   Final Clinical Impression(s) / ED Diagnoses Final diagnoses:  Alcoholic ketoacidosis  Alcohol withdrawal syndrome without complication ([place])    Rx / DC Orders  ED Discharge Orders     None         Kyo Cocuzza, Barbette Hair, MD 09/07/21 0157

## 2021-09-07 ENCOUNTER — Emergency Department (HOSPITAL_COMMUNITY): Payer: Self-pay

## 2021-09-07 DIAGNOSIS — E876 Hypokalemia: Secondary | ICD-10-CM

## 2021-09-07 DIAGNOSIS — R112 Nausea with vomiting, unspecified: Secondary | ICD-10-CM

## 2021-09-07 DIAGNOSIS — E8729 Other acidosis: Secondary | ICD-10-CM

## 2021-09-07 DIAGNOSIS — K257 Chronic gastric ulcer without hemorrhage or perforation: Secondary | ICD-10-CM

## 2021-09-07 DIAGNOSIS — F10930 Alcohol use, unspecified with withdrawal, uncomplicated: Secondary | ICD-10-CM

## 2021-09-07 LAB — COMPREHENSIVE METABOLIC PANEL
ALT: 31 U/L (ref 0–44)
AST: 35 U/L (ref 15–41)
Albumin: 3.8 g/dL (ref 3.5–5.0)
Alkaline Phosphatase: 45 U/L (ref 38–126)
Anion gap: 14 (ref 5–15)
BUN: 13 mg/dL (ref 6–20)
CO2: 22 mmol/L (ref 22–32)
Calcium: 8.1 mg/dL — ABNORMAL LOW (ref 8.9–10.3)
Chloride: 100 mmol/L (ref 98–111)
Creatinine, Ser: 0.44 mg/dL (ref 0.44–1.00)
GFR, Estimated: >60 mL/min (ref >60)
Glucose, Bld: 134 mg/dL — ABNORMAL HIGH (ref 70–99)
Potassium: 2.9 mmol/L — ABNORMAL LOW (ref 3.5–5.1)
Sodium: 136 mmol/L (ref 135–145)
Total Bilirubin: 0.6 mg/dL (ref 0.3–1.2)
Total Protein: 6.4 g/dL — ABNORMAL LOW (ref 6.5–8.1)

## 2021-09-07 LAB — MAGNESIUM: Magnesium: 1.3 mg/dL — ABNORMAL LOW (ref 1.7–2.4)

## 2021-09-07 LAB — PHOSPHORUS: Phosphorus: 2.4 mg/dL — ABNORMAL LOW (ref 2.5–4.6)

## 2021-09-07 LAB — RESP PANEL BY RT-PCR (FLU A&B, COVID) ARPGX2
Influenza A by PCR: NEGATIVE
Influenza B by PCR: NEGATIVE
SARS Coronavirus 2 by RT PCR: NEGATIVE

## 2021-09-07 LAB — CBC
HCT: 38.7 % (ref 36.0–46.0)
Hemoglobin: 12.9 g/dL (ref 12.0–15.0)
MCH: 31.7 pg (ref 26.0–34.0)
MCHC: 33.3 g/dL (ref 30.0–36.0)
MCV: 95.1 fL (ref 80.0–100.0)
Platelets: 171 10*3/uL (ref 150–400)
RBC: 4.07 MIL/uL (ref 3.87–5.11)
RDW: 14 % (ref 11.5–15.5)
WBC: 8.5 10*3/uL (ref 4.0–10.5)
nRBC: 0 % (ref 0.0–0.2)

## 2021-09-07 LAB — APTT: aPTT: 30 seconds (ref 24–36)

## 2021-09-07 MED ORDER — ONDANSETRON HCL 4 MG/2ML IJ SOLN
4.0000 mg | Freq: Four times a day (QID) | INTRAMUSCULAR | Status: DC | PRN
  Administered 2021-09-07 – 2021-09-08 (×5): 4 mg via INTRAVENOUS
  Filled 2021-09-07 (×5): qty 2

## 2021-09-07 MED ORDER — MAGNESIUM SULFATE 4 GM/100ML IV SOLN
4.0000 g | Freq: Once | INTRAVENOUS | Status: AC
  Administered 2021-09-07: 4 g via INTRAVENOUS
  Filled 2021-09-07: qty 100

## 2021-09-07 MED ORDER — ACETAMINOPHEN 325 MG PO TABS
ORAL_TABLET | ORAL | Status: AC
  Filled 2021-09-07: qty 2

## 2021-09-07 MED ORDER — FOLIC ACID 1 MG PO TABS
1.0000 mg | ORAL_TABLET | Freq: Every day | ORAL | Status: DC
Start: 2021-09-07 — End: 2021-09-08
  Administered 2021-09-07 – 2021-09-08 (×2): 1 mg via ORAL
  Filled 2021-09-07 (×2): qty 1

## 2021-09-07 MED ORDER — ENOXAPARIN SODIUM 40 MG/0.4ML IJ SOSY: 40.0000 mg | PREFILLED_SYRINGE | INTRAMUSCULAR | Status: DC

## 2021-09-07 MED ORDER — ACETAMINOPHEN 325 MG PO TABS
650.0000 mg | ORAL_TABLET | Freq: Four times a day (QID) | ORAL | Status: DC | PRN
  Administered 2021-09-07 – 2021-09-08 (×2): 650 mg via ORAL
  Filled 2021-09-07: qty 2

## 2021-09-07 MED ORDER — PANTOPRAZOLE SODIUM 40 MG IV SOLR
40.0000 mg | Freq: Two times a day (BID) | INTRAVENOUS | Status: DC
  Administered 2021-09-07 – 2021-09-08 (×3): 40 mg via INTRAVENOUS
  Filled 2021-09-07 (×3): qty 10

## 2021-09-07 MED ORDER — DIAZEPAM 5 MG PO TABS
5.0000 mg | ORAL_TABLET | Freq: Three times a day (TID) | ORAL | Status: AC
  Administered 2021-09-07 – 2021-09-08 (×4): 5 mg via ORAL
  Filled 2021-09-07 (×4): qty 1

## 2021-09-07 MED ORDER — LABETALOL HCL 5 MG/ML IV SOLN: 10.0000 mg | INTRAVENOUS | Status: DC | PRN

## 2021-09-07 MED ORDER — ADULT MULTIVITAMIN W/MINERALS CH
1.0000 | ORAL_TABLET | ORAL | Status: DC
  Administered 2021-09-07: 1 via ORAL
  Filled 2021-09-07: qty 1

## 2021-09-07 MED ORDER — ONDANSETRON HCL 4 MG/2ML IJ SOLN
4.0000 mg | Freq: Once | INTRAMUSCULAR | Status: DC
  Filled 2021-09-07: qty 2

## 2021-09-07 MED ORDER — CALCIUM CARBONATE ANTACID 500 MG PO CHEW
400.0000 mg | CHEWABLE_TABLET | Freq: Two times a day (BID) | ORAL | Status: DC
  Administered 2021-09-07 – 2021-09-08 (×3): 400 mg via ORAL
  Filled 2021-09-07 (×3): qty 2

## 2021-09-07 MED ORDER — ENOXAPARIN SODIUM 40 MG/0.4ML IJ SOSY
40.0000 mg | PREFILLED_SYRINGE | INTRAMUSCULAR | Status: DC
  Administered 2021-09-07: 40 mg via SUBCUTANEOUS
  Filled 2021-09-07 (×2): qty 0.4

## 2021-09-07 MED ORDER — PANTOPRAZOLE SODIUM 40 MG IV SOLR
40.0000 mg | Freq: Once | INTRAVENOUS | Status: AC
  Administered 2021-09-07: 40 mg via INTRAVENOUS
  Filled 2021-09-07: qty 10

## 2021-09-07 MED ORDER — SODIUM CHLORIDE 0.9 % IV BOLUS
1000.0000 mL | Freq: Once | INTRAVENOUS | Status: AC
Start: 2021-09-07 — End: 2021-09-07
  Administered 2021-09-07: 1000 mL via INTRAVENOUS

## 2021-09-07 MED ORDER — POTASSIUM CHLORIDE CRYS ER 20 MEQ PO TBCR
40.0000 meq | EXTENDED_RELEASE_TABLET | Freq: Once | ORAL | Status: AC
  Administered 2021-09-07: 40 meq via ORAL
  Filled 2021-09-07: qty 2

## 2021-09-07 MED ORDER — POTASSIUM CHLORIDE CRYS ER 20 MEQ PO TBCR
40.0000 meq | EXTENDED_RELEASE_TABLET | ORAL | Status: AC
  Administered 2021-09-07 (×2): 40 meq via ORAL
  Filled 2021-09-07 (×2): qty 2

## 2021-09-07 MED ORDER — AMLODIPINE BESYLATE 5 MG PO TABS
5.0000 mg | ORAL_TABLET | Freq: Every day | ORAL | Status: DC
  Administered 2021-09-07 – 2021-09-08 (×2): 5 mg via ORAL
  Filled 2021-09-07 (×2): qty 1

## 2021-09-07 MED ORDER — IOHEXOL 300 MG/ML  SOLN
75.0000 mL | Freq: Once | INTRAMUSCULAR | Status: AC | PRN
  Administered 2021-09-07: 75 mL via INTRAVENOUS

## 2021-09-07 MED ORDER — POTASSIUM CHLORIDE IN NACL 20-0.9 MEQ/L-% IV SOLN
INTRAVENOUS | Status: DC
  Filled 2021-09-07: qty 1000

## 2021-09-07 MED ORDER — K PHOS MONO-SOD PHOS DI & MONO 155-852-130 MG PO TABS
250.0000 mg | ORAL_TABLET | Freq: Four times a day (QID) | ORAL | Status: DC
  Administered 2021-09-07 – 2021-09-08 (×3): 250 mg via ORAL
  Filled 2021-09-07 (×3): qty 1

## 2021-09-07 NOTE — ED Notes (Signed)
Unhooked pt from monitor to go to the bathroom.then hooked pt back up pt req to see her nurse

## 2021-09-07 NOTE — Plan of Care (Signed)
?  Problem: Coping: ?Goal: Level of anxiety will decrease ?Outcome: Progressing ?  ?Problem: Safety: ?Goal: Ability to remain free from injury will improve ?Outcome: Progressing ?  ?

## 2021-09-07 NOTE — Assessment & Plan Note (Addendum)
Patient presented for treatments of acute alcohol withdrawal Patient reportedly drinks 1/5 daily, last alcohol drink was reportedly 4-5 days ago) Pt was treated with CIWA protocol Continue thiamine, folic acid and multivitamin Continue fall precaution and neuro checks Patient was counseled on alcohol withdrawal cessation Pt requested discharge home on benzo taper DC home on librium taper.  Pt not interested in outpatient alcohol treatment program at this time.

## 2021-09-07 NOTE — Progress Notes (Signed)
Patient seen and evaluated, chart reviewed, please see EMR for updated orders. Please see full H&P dictated by admitting physician Dr Josephine Cables for same date of service.    Brief Summary:- 46 year old  White female with past medical history relevant for EtOH abuse, tobacco abuse, and THC, and benzo abuse as well as HTN,  history of hep C, anxiety disorder, status post prior ORIF of right ankle, history of alcoholic esophageal gastro duodenitis/gastric ulcers and  prior history of alcohol induced seizures   A/p 1)Alcohol withdrawal /DTS- -History of recurrent admissions for same -Patient admits to heavy drinking (liquor/vodka) -Now with full-blown DTs -Patient states that she is not interested in outpatient or inpatient alcohol rehab programs -Lorazepam per CIWA protocol -Thiamine and folic acid as ordered   2)Intractable nausea and vomiting/H/o Alcoholic esophageal gastro duodenitis/gastric ulcer-  -Protonix as ordered -As needed Zofran  3)Hypokalemia/Hypomagnesiemia/hypophosphatemia  -Due to intractable in the setting of heavy alcohol abuse -Replace and recheck   4)High anion Gap metabolic Acidosis-  This is possibly due to alcoholic ketosis -Hydrate aggressively   5)Polysubstance Abuse ---Pt uses Tobacco, Benzo, Alcohol, opioids, marijuana --Patient repeatedly declines rehab    6)Hypertension -not compliant  -Restart amlodipine -IV labetalol as needed elevated BP  Patient seen and evaluated, chart reviewed, please see EMR for updated orders. Please see full H&P dictated by admitting physician Dr Josephine Cables for same date of service.   -Total care time 43 minutes  Disposition--- anticipate discharge home in 1 to 2 days if alcohol withdrawal symptoms and emesis clinically improves, and if electrolytes normalized however patient has signed out [place] in the past  Roxan Hockey, MD

## 2021-09-07 NOTE — Assessment & Plan Note (Addendum)
Patient was undergoing alcohol withdrawal Patient usually drinks 1/5 daily, last alcohol drink was on Sunday (2 days ago) Continue CIWA protocol

## 2021-09-07 NOTE — Assessment & Plan Note (Addendum)
Treated with IV fluid hydration

## 2021-09-07 NOTE — Assessment & Plan Note (Addendum)
Treated supportively and now has been tolerating diet with no emesis.

## 2021-09-07 NOTE — Assessment & Plan Note (Addendum)
-   Continue Protonix 

## 2021-09-07 NOTE — Assessment & Plan Note (Addendum)
K+ was replenished Additional Mg given IV for low levels prior to discharge.

## 2021-09-07 NOTE — H&P (Signed)
History and Physical    Patient: Olivia Lester PPJ:[id number] DOB: 01/29/1976 DOA: 09/06/2021 DOS: the patient was seen and examined on 09/07/2021 PCP: Patient, No Pcp Per (Inactive)  Patient coming from: Home  Chief Complaint:  Chief Complaint  Patient presents with   Emesis    HPI: Olivia Lester is a 46 y.o. 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  Patient usually drinks 1/5 of liquor daily and and last alcohol consumption was on Sunday (2/12) around 6 PM, on waking up yesterday (Monday) around 4 AM she complained of vomiting and difficulty in being able to keep anything down afterwards.  She complained of nausea and epigastric pain.  She denies fever, chills, shortness of breath, headache, blurry vision  ED course: In the emergency department, BP was elevated at 177/111 and other vital signs were within normal range.  Work-up in the ED showed normal CBC.  Hypokalemia, hypochloremia and anion gap of 27.  Influenza A, B, SARS coronavirus 2 was negative. CT abdomen and pelvis with contrast showed no acute intra-abdominal or intrapelvic abnormality Patient was started on CIWA protocol, Zofran was given, Protonix was given and IV hydration was provided.  Hospitalist was asked to admit patient for further evaluation and management.  Review of Systems: As mentioned in the history of present illness. All other systems reviewed and are negative. Past Medical History:  Diagnosis Date   Alcohol abuse    Back injury    Gastric ulcer    Gastric ulcer 10/14/2013   Hepatitis C 10/14/2013   History of kidney stones    Nonunion of foot fracture    right 5th metatarsal   Pancreatitis, acute    Panic attack    Pneumonia    Polysubstance abuse (HCC)    PONV (postoperative nausea and vomiting)     Seizures ([place])    Past Surgical History:  Procedure Laterality Date   BACK SURGERY     BREAST SURGERY Left    lumpectomy   ESOPHAGOGASTRODUODENOSCOPY (EGD) WITH PROPOFOL N/A 10/14/2013   Procedure: ESOPHAGOGASTRODUODENOSCOPY (EGD) WITH PROPOFOL;  Surgeon: Rogene Houston, MD;  Location: [facility];  Service: Endoscopy;  Laterality: N/A;   ORIF TOE FRACTURE Right 08/08/2018   Procedure: RIGHT FOOT INTERNAL FIXATION 5TH METATARSAL;  Surgeon: Newt Minion, MD;  Location: [facility];  Service: Orthopedics;  Laterality: Right;   TONSILLECTOMY     Social History:  reports that she has been smoking cigarettes. She has a 20.00 pack-year smoking history. She has never used smokeless tobacco. She reports current alcohol use of about 18.0 standard drinks per week. She reports current drug use. Drug: Marijuana.  Allergies  Allergen Reactions   Toradol [Ketorolac Tromethamine] Other (See Comments)    Patient advised not to take due to ulcers   Asa [Aspirin] Rash    Family History  Problem Relation Age of Onset   Cancer Mother 55       ovarian    Hypertension Mother    Stroke Mother    Early death Father    Alcohol abuse Father     Prior to Admission medications   Medication Sig Start Date End Date Taking? Authorizing Provider  amitriptyline (ELAVIL) 50 MG tablet Take 50 mg by mouth at bedtime. Patient not taking: Reported on 08/17/2021 03/24/21   [provider]  amLODipine (NORVASC) 5  MG tablet Take 1 tablet (5 mg total) by mouth daily. Patient not taking: Reported on 10/01/2020 10/28/19   Roxan Hockey, MD  chlordiazePOXIDE (LIBRIUM) 25 MG capsule 50mg  PO TID x 1D, then 25-50mg  PO BID X 1D, then 25-50mg  PO QD X 1D Patient not taking: Reported on 2/35/3614 43/15/40   Delora Fuel, MD  folic acid (FOLVITE) 1 MG tablet Take 1 tablet (1 mg total) by mouth daily. Patient not taking: Reported on 08/17/2021 10/29/19   Roxan Hockey, MD  lidocaine (LIDODERM) 5 % Place 1 patch onto the skin  daily. Remove & Discard patch within 12 hours or as directed by MD Patient not taking: Reported on 08/17/2021 07/12/20   Heath Lark D, DO  lipase/protease/amylase 24000-76000 units CPEP Take 1 capsule (24,000 Units total) by mouth 3 (three) times daily before meals. Patient not taking: Reported on 01/07/2020 10/29/19   Roxan Hockey, MD  magnesium 30 MG tablet Take 1 tablet (30 mg total) by mouth 2 (two) times daily. Patient not taking: Reported on 0/86/7619 50/93/26   Delora Fuel, MD  methocarbamol (ROBAXIN) 500 MG tablet Take 1 tablet (500 mg total) by mouth every 8 (eight) hours as needed for muscle spasms. Patient not taking: Reported on 08/17/2021 07/11/20   Heath Lark D, DO  Multiple Vitamin (MULTIVITAMIN WITH MINERALS) TABS tablet Take 1 tablet by mouth every other day.  Patient not taking: Reported on 01/07/2020    [provider]  pantoprazole (PROTONIX) 40 MG tablet Take 1 tablet (40 mg total) by mouth daily. 06/03/20 09/01/20  Harvest Dark, MD  potassium chloride SA (KLOR-CON) 20 MEQ tablet Take 1 tablet (20 mEq total) by mouth 2 (two) times daily. Patient not taking: Reported on 02/03/4579 99/83/38   Delora Fuel, MD  promethazine (PHENERGAN) 25 MG tablet Take 1 tablet (25 mg total) by mouth every 6 (six) hours as needed for nausea or vomiting. Patient not taking: Reported on 08/17/2021 08/10/20   Veryl Speak, MD    Physical Exam: Vitals:   09/06/21 2300 09/06/21 2330 09/07/21 0035 09/07/21 0100  BP: (!) 172/114 (!) 175/119 (!) 155/100 (!) 144/92  Pulse: (!) 104 (!) 116 91 91  Resp:   17 15  Temp:      TempSrc:      SpO2: 99% 100% 100% 96%  Weight:      Height:       General:  Awake, alert and oriented x3. Not in any acute distress.  HEENT: NCAT.  PERRLA. EOMI. Sclerae anicteric.  Moist mucosal membranes. Neck: Neck supple without lymphadenopathy. No carotid bruits. No masses palpated.  Cardiovascular: Regular rate with normal S1-S2 sounds. No murmurs, rubs  or gallops auscultated. No JVD.  Respiratory: Clear breath sounds.  No accessory muscle use. Abdomen: Soft, tender to palpation of epigastric area, nondistended. Active bowel sounds. No masses or hepatosplenomegaly  Skin: No rashes, lesions, or ulcerations.  Dry, warm to touch. Musculoskeletal:  2+ dorsalis pedis and radial pulses. Good ROM.  No contractures  Psychiatric: Intact judgment and insight.  Mood appropriate to current condition. Neurologic: No focal neurological deficits. Strength is 5/5 x 4.  CN II - XII grossly intact.   Data Reviewed: There are no new results to review at this time.  Assessment and Plan: * Alcohol withdrawal ([place])- (present on admission) Patient was undergoing alcohol withdrawal Patient usually drinks 1/5 daily, last alcohol drink was on Sunday (2 days ago) Continue CIWA protocol Alcohol level was pending Continue thiamine, folic acid and multivitamin Continue  fall precaution and neuro checks Patient was counseled on alcohol withdrawal cessation  Intractable nausea and vomiting- (present on admission) Continue Zofran as needed  High anion gap metabolic acidosis- (present on admission) This is possibly due to alcoholic ketoacidosis Bicarb 18, anion gap 27 Continue IV hydration Continue to monitor for anion gap closure  Hypokalemia- (present on admission) K+ is 3.1 K+ will be replenished Please monitor for AM K+ for further replenishmemnt   Gastric ulcer- (present on admission) Continue Protonix    Advance Care Planning:   Code Status: Full Code   Consults: None  Disposition: Anticipated Discharge in about 48 - 72 hours to home    Family Communication: None at bedside  Severity of Illness: The appropriate patient status for this patient is INPATIENT. Inpatient status is judged to be reasonable and necessary in order to provide the required intensity of service to ensure the patient's safety. The patient's presenting symptoms, physical  exam findings, and initial radiographic and laboratory data in the context of their chronic comorbidities is felt to place them at high risk for further clinical deterioration. Furthermore, it is not anticipated that the patient will be medically stable for discharge from the hospital within 2 midnights of admission.   * I certify that at the point of admission it is my clinical judgment that the patient will require inpatient hospital care spanning beyond 2 midnights from the point of admission due to high intensity of service, high risk for further deterioration and high frequency of surveillance required.*  Author: Bernadette Hoit, DO 09/07/2021 3:41 AM  For on call review [email or web address].

## 2021-09-08 ENCOUNTER — Encounter (HOSPITAL_COMMUNITY): Payer: Self-pay | Admitting: Internal Medicine

## 2021-09-08 DIAGNOSIS — I10 Essential (primary) hypertension: Secondary | ICD-10-CM

## 2021-09-08 LAB — RENAL FUNCTION PANEL
Albumin: 3.7 g/dL (ref 3.5–5.0)
Anion gap: 12 (ref 5–15)
BUN: 6 mg/dL (ref 6–20)
CO2: 20 mmol/L — ABNORMAL LOW (ref 22–32)
Calcium: 8.7 mg/dL — ABNORMAL LOW (ref 8.9–10.3)
Chloride: 103 mmol/L (ref 98–111)
Creatinine, Ser: 0.42 mg/dL — ABNORMAL LOW (ref 0.44–1.00)
GFR, Estimated: >60 mL/min (ref >60)
Glucose, Bld: 93 mg/dL (ref 70–99)
Phosphorus: 2.1 mg/dL — ABNORMAL LOW (ref 2.5–4.6)
Potassium: 4 mmol/L (ref 3.5–5.1)
Sodium: 135 mmol/L (ref 135–145)

## 2021-09-08 LAB — MAGNESIUM: Magnesium: 1.6 mg/dL — ABNORMAL LOW (ref 1.7–2.4)

## 2021-09-08 MED ORDER — MAGNESIUM 30 MG PO TABS: 30.0000 mg | ORAL_TABLET | Freq: Two times a day (BID) | ORAL | 0 refills | Status: AC

## 2021-09-08 MED ORDER — MAGNESIUM SULFATE 2 GM/50ML IV SOLN
2.0000 g | Freq: Once | INTRAVENOUS | Status: AC
  Administered 2021-09-08: 2 g via INTRAVENOUS
  Filled 2021-09-08: qty 50

## 2021-09-08 MED ORDER — THIAMINE HCL 100 MG PO TABS: 100.0000 mg | ORAL_TABLET | Freq: Every day | ORAL | 1 refills | Status: AC

## 2021-09-08 MED ORDER — ADULT MULTIVITAMIN W/MINERALS CH: 1.0000 | ORAL_TABLET | ORAL | Status: AC

## 2021-09-08 MED ORDER — PANTOPRAZOLE SODIUM 40 MG PO TBEC: 40.0000 mg | DELAYED_RELEASE_TABLET | Freq: Every day | ORAL | 2 refills | Status: AC

## 2021-09-08 MED ORDER — AMLODIPINE BESYLATE 5 MG PO TABS: 5.0000 mg | ORAL_TABLET | Freq: Every day | ORAL | 3 refills | Status: AC

## 2021-09-08 MED ORDER — FOLIC ACID 1 MG PO TABS: 1.0000 mg | ORAL_TABLET | Freq: Every day | ORAL | 1 refills | Status: AC

## 2021-09-08 MED ORDER — CHLORDIAZEPOXIDE HCL 25 MG PO CAPS: ORAL_CAPSULE | ORAL | 0 refills | Status: AC

## 2021-09-08 NOTE — Assessment & Plan Note (Signed)
Resumed amlodipine 5 mg daily Outpatient PCP follow up strongly advised TOC consulted for assistance with PCP.

## 2021-09-08 NOTE — TOC Transition Note (Addendum)
Transition of Care [facility]) - CM/SW Discharge Note   Patient Details  Name: Olivia Lester MRN: [id number] Date of Birth: 17-May-1976  Transition of Care [facility]) CM/SW Contact:  Boneta Lucks, RN Phone Number: 09/08/2021, 11:38 AM   Clinical Narrative:   Patient discharging home  admitted for Alcohol abuse. Well Known to [facility], Resources given multiple times. Patient refusing resources this admission.    Addendum : Patient agreeing to [facility] to assist with with PCP appointment. Referral made and phone number updated.   Final next level of care: Home/Self Care Barriers to Discharge: Barriers Resolved  Patient Goals and CMS Choice Patient states their goals for this hospitalization and ongoing recovery are:: to go home. CMS Medicare.gov Compare Post Acute Care list provided to:: Patient   Discharge Placement    Patient and family notified of of transfer: 09/08/21  Discharge Plan and [place]    Readmission Risk Interventions Readmission Risk Prevention Plan 09/08/2021 07/07/2020  Transportation Screening Complete Complete  Medication Review Press photographer) Complete Complete  HRI or Home Care Consult Complete Complete  SW Recovery Care/Counseling Consult Complete Complete  Palliative Care Screening Not Applicable Not [place] Not Applicable Not Applicable  Some recent data might be hidden

## 2021-09-08 NOTE — Discharge Summary (Signed)
Discharge Summary   Patient: Olivia Lester ASN:[id number] DOB: Dec 08, 1975 PCP: Patient, No Pcp Per (Inactive)  Date of admission: 09/06/2021  Date of discharge: 09/08/2021  Discharge Condition: STABLE   Recommendations at discharge:  Recommend outpatient alcohol treatment program.   Discharge Diagnoses: Principal Problem:   Alcohol withdrawal ([place]) Active Problems:   Gastric ulcer   Hypokalemia   Essential hypertension   High anion gap metabolic acidosis   Intractable nausea and vomiting   Hospital Course: 46 y.o. 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  Patient usually drinks 1/5 of liquor daily and and last alcohol consumption was on Sunday (2/12) around 6 PM, on waking up yesterday (Monday) around 4 AM she complained of vomiting and difficulty in being able to keep anything down afterwards.  She complained of nausea and epigastric pain.  She denies fever, chills, shortness of breath, headache, blurry vision  ED course: In the emergency department, BP was elevated at 177/111 and other vital signs were within normal range.  Work-up in the ED showed normal CBC.  Hypokalemia, hypochloremia and anion gap of 27.  Influenza A, B, SARS coronavirus 2 was negative.  CT abdomen and pelvis with contrast showed no acute intra-abdominal or intrapelvic abnormality.  Patient was started on CIWA protocol, Zofran was given, Protonix was given and IV hydration was provided.  Hospitalist was asked to admit patient for further evaluation and management.  09/08/21:  Pt tolerating diet and having less withdrawal symptoms.  Pt wanting to go home on a benzo taper like she has in the past.  Not interested in outpatient alcohol treatment at this time.  Assessment and Plan: * Alcohol withdrawal ([place])-  (present on admission) Patient presented for treatments of acute alcohol withdrawal Patient reportedly drinks 1/5 daily, last alcohol drink was reportedly 4-5 days ago) Pt was treated with CIWA protocol Continue thiamine, folic acid and multivitamin Continue fall precaution and neuro checks Patient was counseled on alcohol withdrawal cessation Pt requested discharge home on benzo taper DC home on librium taper.  Pt not interested in outpatient alcohol treatment program at this time.    Intractable nausea and vomiting- (present on admission) Treated supportively and now has been tolerating diet with no emesis.   High anion gap metabolic acidosis- (present on admission) Treated with IV fluid hydration  Essential hypertension- (present on admission) Resumed amlodipine 5 mg daily Outpatient PCP follow up strongly advised TOC consulted for assistance with PCP.   Hypokalemia- (present on admission) K+ was replenished Additional Mg given IV for low levels prior to discharge.     Gastric ulcer- (present on admission) Continue Protonix for GI protection.       Consultants:  Procedures:   Pt tolerating diet with no emesis.  Pt requesting discharge home on benzo taper like she has done multiple times in past.  Still not agreeable to outpatient alcohol rehab treatment.   Exam: Filed Weights   09/06/21 2142  Weight: 53 kg   General: awake, alert, oriented x 3, thin emaciated female, NAD Heent : NCAT, MMM Neck: supple, no JVD Lungs: BBS clear to auscultation CV : normal s1, s2 sounds Abd: soft Neuro: nonfocal exam   The results of significant diagnostics from this hospitalization (including imaging, microbiology, ancillary and laboratory) are listed below for reference.  Imaging Studies: Abd 1 View (KUB)  Result  Date: 08/17/2021 CLINICAL DATA:  Abdominal pain. EXAM: ABDOMEN - 1 VIEW COMPARISON:  CT abdomen and pelvis without contrast 03/06/2021 FINDINGS: The bowel gas  pattern is normal. No radio-opaque calculi or other significant radiographic abnormality are seen. The visceral shadows are stable with prior studies showing severe hepatic steatosis. L5-S1 posterior fusion rods and pedicle screws are again shown. IMPRESSION: No acute radiographic findings or radiographic interval changes. Electronically Signed   By: Telford Nab M.D.   On: 08/17/2021 06:39   CT ABDOMEN PELVIS W CONTRAST  Result Date: 09/07/2021 CLINICAL DATA:  Abdominal pain, acute, nonlocalized. Pt c/o emesis that started yesterday, states she is a heavy drinker and her last drink was about 6pm yesterday. EXAM: CT ABDOMEN AND PELVIS WITH CONTRAST TECHNIQUE: Multidetector CT imaging of the abdomen and pelvis was performed using the standard protocol following bolus administration of intravenous contrast. RADIATION DOSE REDUCTION: This exam was performed according to the departmental dose-optimization program which includes automated exposure control, adjustment of the mA and/or kV according to patient size and/or use of iterative reconstruction technique. CONTRAST:  43mL OMNIPAQUE IOHEXOL 300 MG/ML  SOLN COMPARISON:  None. FINDINGS: Lower chest: No acute abnormality. Hepatobiliary: The hepatic parenchyma is diffusely hypodense compared to the splenic parenchyma consistent with fatty infiltration. No focal liver abnormality. No gallstones, gallbladder wall thickening, or pericholecystic fluid. No biliary dilatation. Pancreas: Diffusely atrophic. No focal lesion. Otherwise normal pancreatic contour. No surrounding inflammatory changes. No main pancreatic ductal dilatation. Spleen: Normal in size without focal abnormality. Adrenals/Urinary Tract: No adrenal nodule bilaterally. Bilateral kidneys enhance symmetrically. No hydronephrosis. No hydroureter. The urinary bladder is unremarkable. Stomach/Bowel: Stomach is within normal limits. No evidence of bowel wall thickening or dilatation. Appendix appears normal.  Vascular/Lymphatic: No abdominal aorta or iliac aneurysm. Severe atherosclerotic plaque of the aorta and its branches. No abdominal, pelvic, or inguinal lymphadenopathy. Reproductive: A fat containing 2.3 x 1.8 cm right adnexal lesion. Uterus and the left adnexa. Other: No intraperitoneal free fluid. No intraperitoneal free gas. No organized fluid collection. Musculoskeletal: No abdominal wall hernia or abnormality. No suspicious lytic or blastic osseous lesions. No acute displaced fracture. L5-S1 posterolateral and interbody fusion. IMPRESSION: 1. 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 2. Hepatic steatosis. 3. Severe aortic Atherosclerosis (ICD10-I70.0). 4. No acute intra-abdominal or intrapelvic abnormality. Electronically Signed   By: Iven Finn M.D.   On: 09/07/2021 01:41    Microbiology: Results for orders placed or performed during the hospital encounter of 09/06/21  Resp Panel by RT-PCR (Flu A&B, Covid) Nasopharyngeal Swab     Status: None   Collection Time: 09/06/21 11:42 PM   Specimen: Nasopharyngeal Swab; Nasopharyngeal(NP) swabs in vial transport medium  Result Value Ref Range Status   SARS Coronavirus 2 by RT PCR NEGATIVE NEGATIVE Final    Comment: (NOTE) SARS-CoV-2 target nucleic acids are NOT DETECTED.  The SARS-CoV-2 RNA is generally detectable in upper respiratory specimens during the acute phase of infection. The lowest concentration of SARS-CoV-2 viral copies this assay can detect is 138 copies/mL. A negative result does not preclude SARS-Cov-2 infection and should not be used as the sole basis for treatment or other patient management decisions. A negative result may occur with  improper specimen collection/handling, submission of specimen other than nasopharyngeal swab, presence of viral mutation(s) within the areas targeted by this assay, and inadequate number of viral copies(<138 copies/mL). A negative result must  be combined with clinical observations, patient history, and epidemiological information.  The expected result is Negative.  Fact Sheet for Patients:  EntrepreneurPulse.com.au  Fact Sheet for Healthcare Providers:  IncredibleEmployment.be  This test is no t yet approved or cleared by the [place] FDA and  has been authorized for detection and/or diagnosis of SARS-CoV-2 by FDA under an Emergency Use Authorization (EUA). This EUA will remain  in effect (meaning this test can be used) for the duration of the COVID-19 declaration under Section 564(b)(1) of the Act, 21 U.S.C.section 360bbb-3(b)(1), unless the authorization is terminated  or revoked sooner.       Influenza A by PCR NEGATIVE NEGATIVE Final   Influenza B by PCR NEGATIVE NEGATIVE Final    Comment: (NOTE) The Xpert Xpress SARS-CoV-2/FLU/RSV plus assay is intended as an aid in the diagnosis of influenza from Nasopharyngeal swab specimens and should not be used as a sole basis for treatment. Nasal washings and aspirates are unacceptable for Xpert Xpress SARS-CoV-2/FLU/RSV testing.  Fact Sheet for Patients: EntrepreneurPulse.com.au  Fact Sheet for Healthcare Providers: IncredibleEmployment.be  This test is not yet approved or cleared by the [place] FDA and has been authorized for detection and/or diagnosis of SARS-CoV-2 by FDA under an Emergency Use Authorization (EUA). This EUA will remain in effect (meaning this test can be used) for the duration of the COVID-19 declaration under Section 564(b)(1) of the Act, 21 U.S.C. section 360bbb-3(b)(1), unless the authorization is terminated or revoked.  Performed at [facility], [address] [place],  [address]     Labs:  CBC: Recent Labs  Lab 09/06/21 2203 09/07/21 0346  WBC 8.7 8.5  HGB 14.8 12.9  HCT 44.2 38.7  MCV 96.3 95.1  PLT 253 588   Basic Metabolic Panel: Recent  Labs  Lab 09/06/21 2203 09/07/21 0346 09/08/21 0456  NA 139 136 135  K 3.1* 2.9* 4.0  CL 94* 100 103  CO2 18* 22 20*  GLUCOSE 109* 134* 93  BUN 16 13 6   CREATININE 0.61 0.44 0.42*  CALCIUM 10.0 8.1* 8.7*  MG  --  1.3* 1.6*  PHOS  --  2.4* 2.1*   Liver Function Tests: Recent Labs  Lab 09/06/21 2203 09/07/21 0346 09/08/21 0456  AST 49* 35  --   ALT 41 31  --   ALKPHOS 57 45  --   BILITOT 1.5* 0.6  --   PROT 8.3* 6.4*  --   ALBUMIN 5.0 3.8 3.7   CBG: No results for input(s): GLUCAP in the last 168 hours.  Time spent: 35 minutes  Author: Irwin Brakeman, MD

## 2021-09-08 NOTE — Hospital Course (Signed)
46 y.o. 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  Patient usually drinks 1/5 of liquor daily and and last alcohol consumption was on Sunday (2/12) around 6 PM, on waking up yesterday (Monday) around 4 AM she complained of vomiting and difficulty in being able to keep anything down afterwards.  She complained of nausea and epigastric pain.  She denies fever, chills, shortness of breath, headache, blurry vision  ED course: In the emergency department, BP was elevated at 177/111 and other vital signs were within normal range.  Work-up in the ED showed normal CBC.  Hypokalemia, hypochloremia and anion gap of 27.  Influenza A, B, SARS coronavirus 2 was negative.  CT abdomen and pelvis with contrast showed no acute intra-abdominal or intrapelvic abnormality.  Patient was started on CIWA protocol, Zofran was given, Protonix was given and IV hydration was provided.  Hospitalist was asked to admit patient for further evaluation and management.  09/08/21:  Pt tolerating diet and having less withdrawal symptoms.  Pt wanting to go home on a benzo taper like she has in the past.  Not interested in outpatient alcohol treatment at this time.

## 2021-09-15 ENCOUNTER — Encounter (HOSPITAL_COMMUNITY): Payer: Self-pay | Admitting: Emergency Medicine

## 2021-09-15 ENCOUNTER — Emergency Department (HOSPITAL_COMMUNITY)
Admission: EM | Admit: 2021-09-15 | Discharge: 2021-09-15 | Discharge status: Against Medical Advice | Payer: Self-pay | Attending: Emergency Medicine | Admitting: Emergency Medicine

## 2021-09-15 ENCOUNTER — Other Ambulatory Visit: Payer: Self-pay

## 2021-09-15 ENCOUNTER — Emergency Department (HOSPITAL_COMMUNITY): Payer: Self-pay

## 2021-09-15 DIAGNOSIS — F17210 Nicotine dependence, cigarettes, uncomplicated: Secondary | ICD-10-CM | POA: Insufficient documentation

## 2021-09-15 DIAGNOSIS — R4182 Altered mental status, unspecified: Secondary | ICD-10-CM | POA: Insufficient documentation

## 2021-09-15 DIAGNOSIS — S0012XA Contusion of left eyelid and periocular area, initial encounter: Secondary | ICD-10-CM | POA: Insufficient documentation

## 2021-09-15 MED ORDER — ACETAMINOPHEN 500 MG PO TABS
1000.0000 mg | ORAL_TABLET | Freq: Once | ORAL | Status: AC
  Administered 2021-09-15: 1000 mg via ORAL
  Filled 2021-09-15: qty 2

## 2021-09-15 NOTE — ED Notes (Signed)
Pt called out and stated that her mother is on her way to pick her up. Dr Sedonia Small notified.

## 2021-09-15 NOTE — ED Notes (Signed)
Pt left room and headed down hallway to exit. Pt refused to sign AMA paperwork.
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
## 2021-09-15 NOTE — ED Notes (Signed)
In to give pt Tylenol for pain. Pt upset that Tylenol is what is ordered. Pt states "I can take Tylenol at home" and proceeds to demand that this nurse give her a phone she she can call her mother and have her "take her the fuck out of here". Phone handed to pt.
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
## 2021-09-15 NOTE — ED Notes (Signed)
Attempted to reach pt's mother: Olivia Lester @ [phone] @ pt's request. No answer.

## 2022-02-07 IMAGING — CT CT ABD-PELV W/ CM
2 of 5 series · 15 of 46 positions shown, 17 images · IV contrast (Omnipaque or Isovue)
Comparison: 10/28/2019

CLINICAL DATA: Abdominal abscess/infection suspected. Nausea,
vomiting. Patient drank an unknown amount of lack of since
yesterday. History of kidney stones.

EXAM:
CT ABDOMEN AND PELVIS WITH CONTRAST
TECHNIQUE: Multidetector CT imaging of the abdomen and pelvis was performed
using the standard protocol following bolus administration of
intravenous contrast.
CONTRAST:  100mL OMNIPAQUE IOHEXOL 300 MG/ML  SOLN

[Series 2: axial st · axial · 0.73mm/px · z∈[+772,+1232]mm · 12 of 106 slices shown, 14 images]
[im 7/106  soft-tissue]
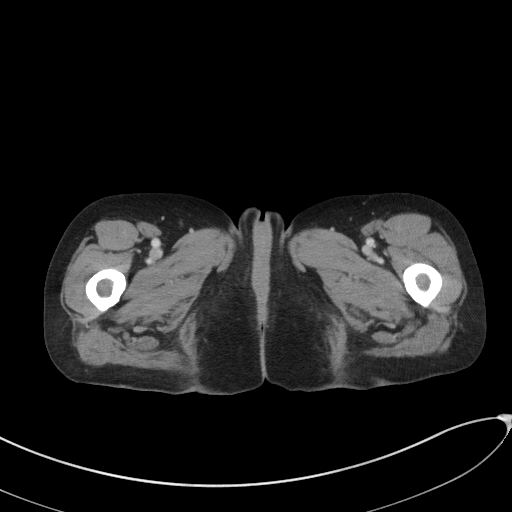
[im 7/106  bone]
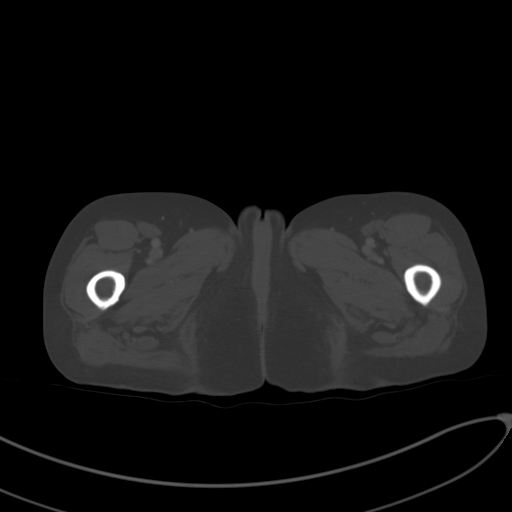
[im 14/106  soft-tissue]
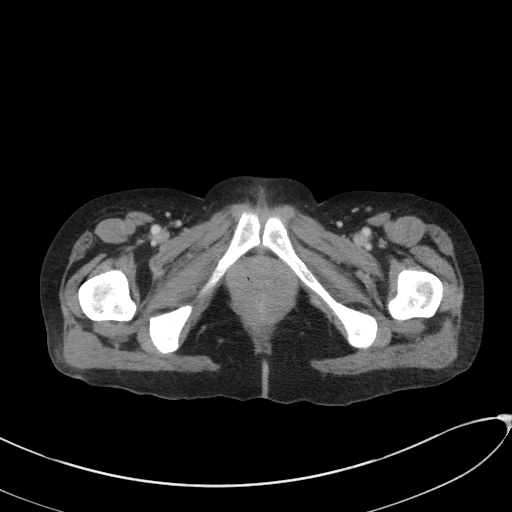
[im 27/106  soft-tissue]
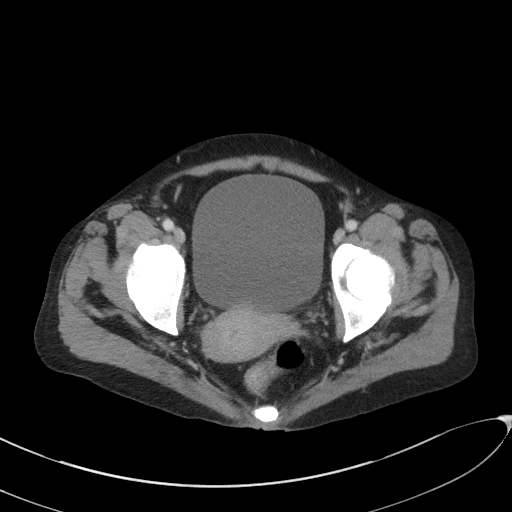
[im 33/106  soft-tissue]
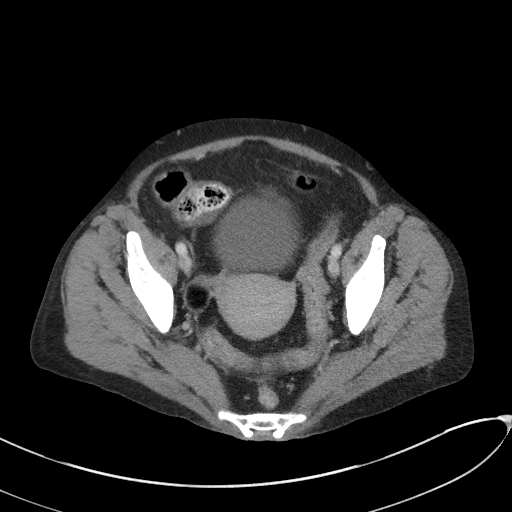
[im 40/106  soft-tissue]
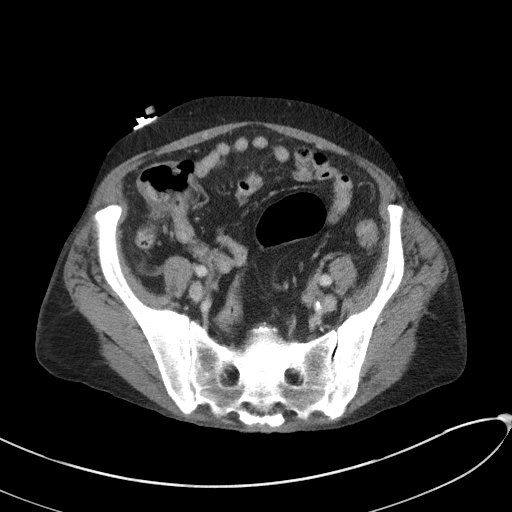
[im 46/106  soft-tissue]
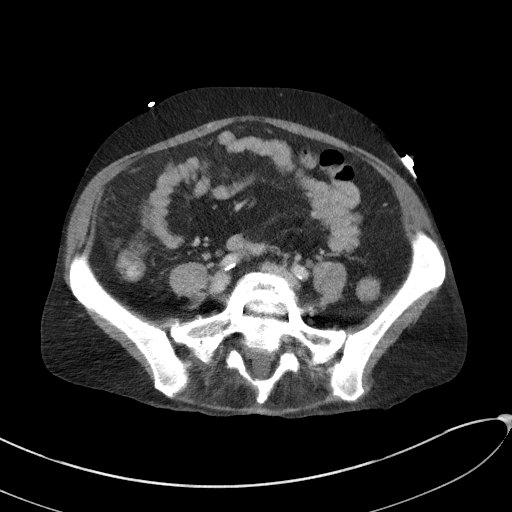
[im 60/106  soft-tissue]
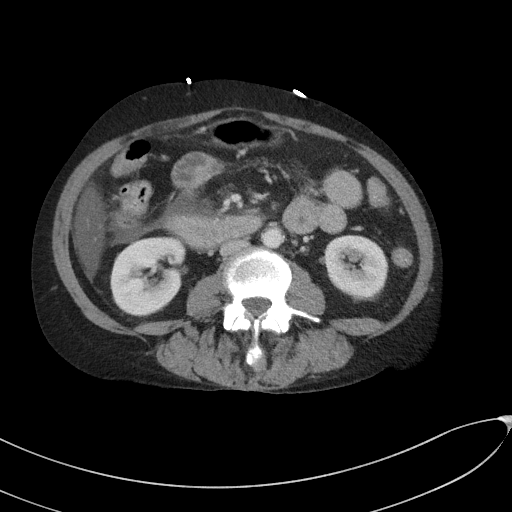
[im 66/106  soft-tissue]
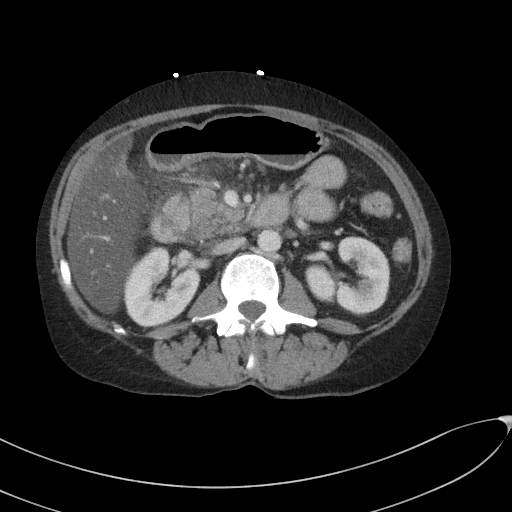
[im 73/106  soft-tissue]
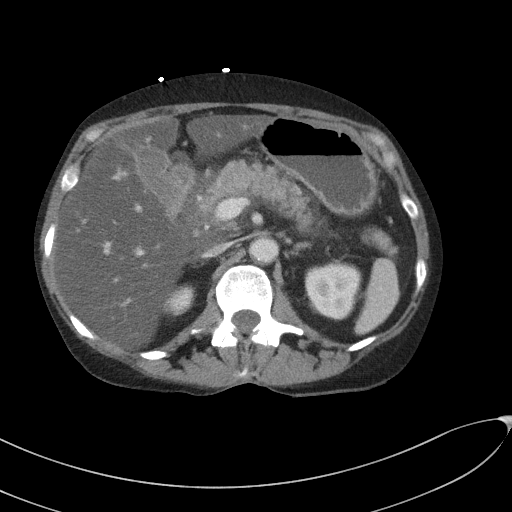
[im 73/106  bone]
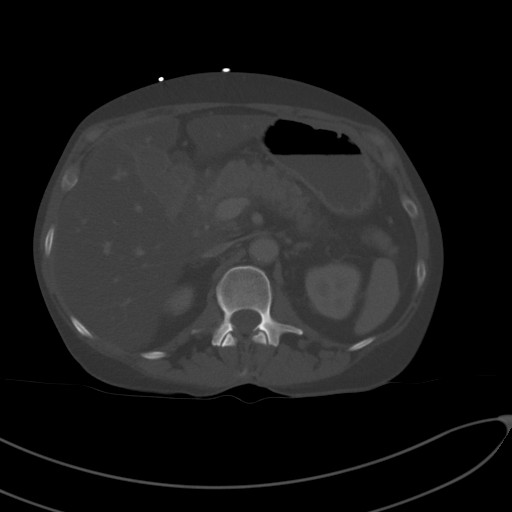
[im 79/106  soft-tissue]
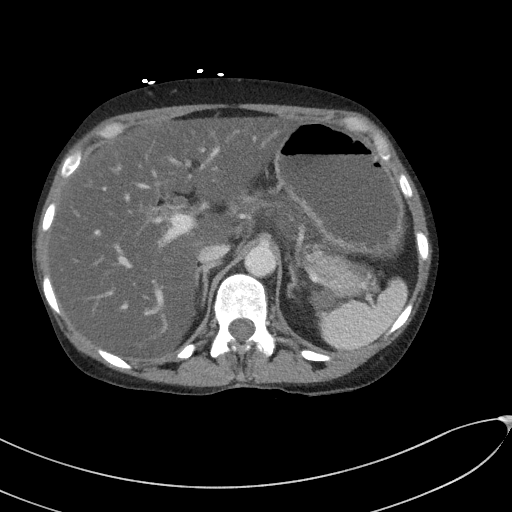
[im 92/106  soft-tissue]
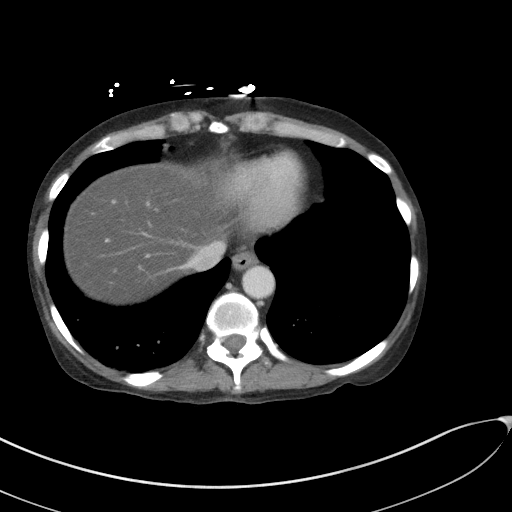
[im 99/106  soft-tissue]
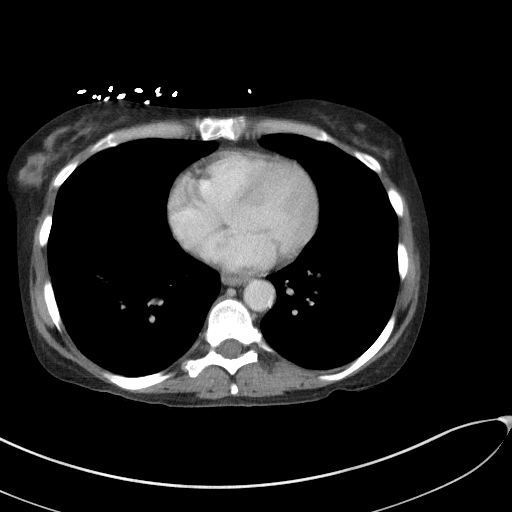

[Series 6: coronal st · coronal · 0.73mm/px · 3 of 92 slices shown]
[im 31/92  soft-tissue]
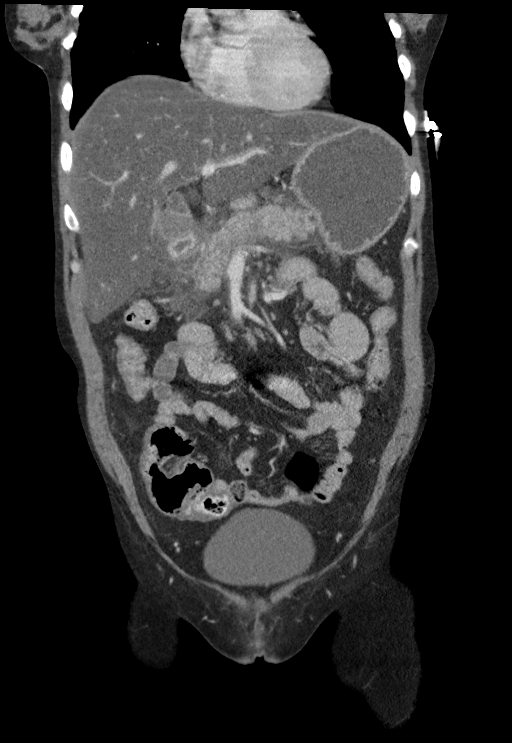
[im 41/92  soft-tissue]
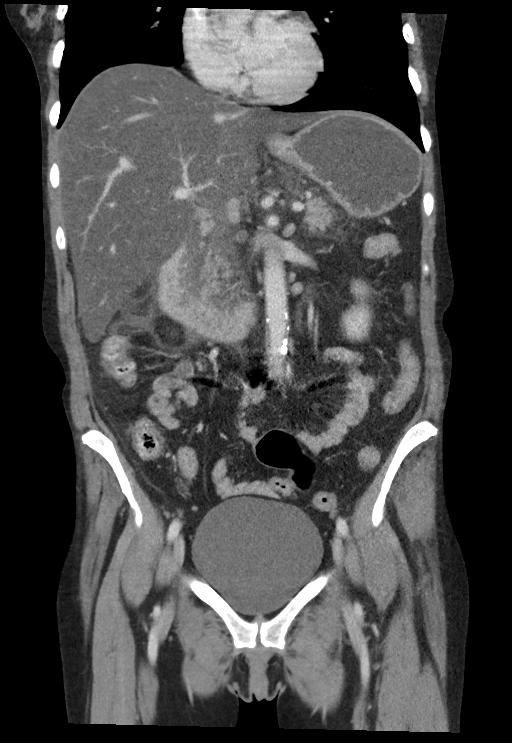
[im 51/92  soft-tissue]
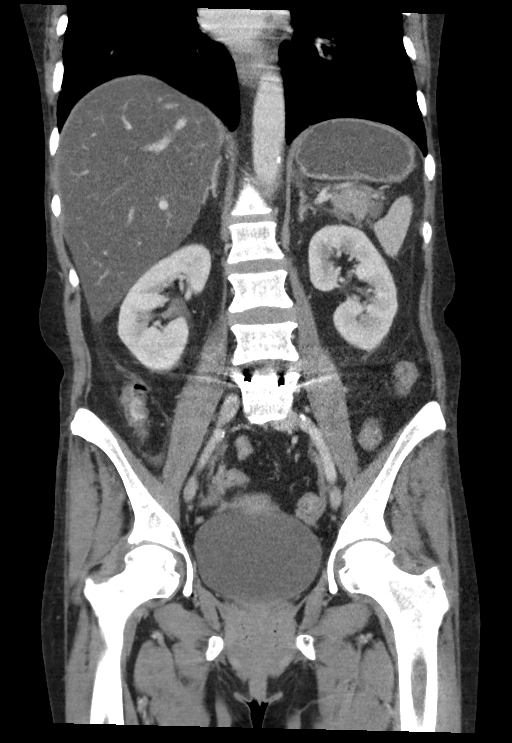

[15 of 46 positions shown; findings below may reference images not displayed]

FINDINGS: Lower chest: Lung bases are unremarkable.

Hepatobiliary: There is severe hepatic steatosis. No focal liver
lesion identified. Gallbladder is present.

Pancreas: There is fluid surrounding the pancreatic head, body, and
tail. There is stranding within the retroperitoneum. Pancreatic
parenchyma enhances normally. No discrete pseudocysts.

Spleen: Normal in size without focal abnormality.

Adrenals/Urinary Tract: Normal adrenal glands. Kidneys and ureters
are unremarkable. The bladder and visualized portion of the urethra
are normal.

Stomach/Bowel: Stomach is unremarkable. There is fluid surrounding
the duodenal, adjacent to the pancreatic head and likely secondary
to pancreatitis rather than duodenal disease. The remainder of the
small bowel loops are unremarkable. Normal appendix.

Vascular/Lymphatic: There is atherosclerotic calcification of the
abdominal aorta. No aneurysm. There is normal vascular opacification
of the celiac axis, superior mesenteric artery, and inferior
mesenteric artery. Normal appearance of the portal venous system and
inferior vena cava. No retroperitoneal or mesenteric adenopathy.

Reproductive: Uterus is present. RIGHT ovarian dermoid is
centimeters, stable in appearance. LEFT adnexal region is
unremarkable.

Other: No free pelvic fluid. Anterior abdominal wall is
unremarkable.

Musculoskeletal: Remote posterior fusion of L5-S1.
IMPRESSION: 1. Findings are consistent with acute pancreatitis. No pancreatic
necrosis or discrete pseudocysts.
2. Severe hepatic steatosis.
3. Stable appearance of RIGHT ovarian dermoid.
4. Remote posterior fusion of L5-S1.
5. Aortic Atherosclerosis ([id number]).

## 2024-04-17 ENCOUNTER — Other Ambulatory Visit (HOSPITAL_COMMUNITY): Payer: Self-pay | Admitting: Neurology

## 2024-04-17 DIAGNOSIS — M542 Cervicalgia: Secondary | ICD-10-CM

## 2024-04-17 DIAGNOSIS — R29898 Other symptoms and signs involving the musculoskeletal system: Secondary | ICD-10-CM

## 2024-04-17 DIAGNOSIS — R200 Anesthesia of skin: Secondary | ICD-10-CM

## 2024-04-25 ENCOUNTER — Ambulatory Visit (HOSPITAL_COMMUNITY)
Admission: RE | Admit: 2024-04-25 | Discharge: 2024-04-25 | Disposition: A | Discharge status: Home / Self Care | Source: Ambulatory Visit | Attending: Neurology | Admitting: Neurology

## 2024-04-25 DIAGNOSIS — R29898 Other symptoms and signs involving the musculoskeletal system: Secondary | ICD-10-CM | POA: Diagnosis present

## 2024-04-25 DIAGNOSIS — M542 Cervicalgia: Secondary | ICD-10-CM | POA: Insufficient documentation

## 2024-04-25 DIAGNOSIS — R200 Anesthesia of skin: Secondary | ICD-10-CM | POA: Insufficient documentation

## 2024-04-25 DIAGNOSIS — R202 Paresthesia of skin: Secondary | ICD-10-CM | POA: Insufficient documentation
